# Patient Record
Sex: Female | Born: 1945 | Race: White | Hispanic: No | Smoking: Former smoker
Health system: Southern US, Community
[De-identification: ages and names within clinical notes are randomized; demographics above are authoritative.]

## PROBLEM LIST (undated history)

## (undated) DIAGNOSIS — D649 Anemia, unspecified: Secondary | ICD-10-CM

## (undated) DIAGNOSIS — J189 Pneumonia, unspecified organism: Secondary | ICD-10-CM

## (undated) DIAGNOSIS — R4182 Altered mental status, unspecified: Secondary | ICD-10-CM

## (undated) DIAGNOSIS — M069 Rheumatoid arthritis, unspecified: Secondary | ICD-10-CM

## (undated) DIAGNOSIS — M109 Gout, unspecified: Secondary | ICD-10-CM

## (undated) DIAGNOSIS — N259 Disorder resulting from impaired renal tubular function, unspecified: Secondary | ICD-10-CM

## (undated) DIAGNOSIS — L989 Disorder of the skin and subcutaneous tissue, unspecified: Secondary | ICD-10-CM

## (undated) DIAGNOSIS — R142 Eructation: Secondary | ICD-10-CM

## (undated) DIAGNOSIS — E875 Hyperkalemia: Secondary | ICD-10-CM

## (undated) DIAGNOSIS — I509 Heart failure, unspecified: Secondary | ICD-10-CM

## (undated) DIAGNOSIS — E785 Hyperlipidemia, unspecified: Principal | ICD-10-CM

## (undated) DIAGNOSIS — S88119A Complete traumatic amputation at level between knee and ankle, unspecified lower leg, initial encounter: Secondary | ICD-10-CM

## (undated) DIAGNOSIS — I10 Essential (primary) hypertension: Secondary | ICD-10-CM

## (undated) DIAGNOSIS — L98499 Non-pressure chronic ulcer of skin of other sites with unspecified severity: Secondary | ICD-10-CM

## (undated) DIAGNOSIS — K589 Irritable bowel syndrome without diarrhea: Secondary | ICD-10-CM

## (undated) DIAGNOSIS — K759 Inflammatory liver disease, unspecified: Secondary | ICD-10-CM

## (undated) DIAGNOSIS — M79609 Pain in unspecified limb: Secondary | ICD-10-CM

## (undated) DIAGNOSIS — R0902 Hypoxemia: Secondary | ICD-10-CM

## (undated) DIAGNOSIS — R569 Unspecified convulsions: Principal | ICD-10-CM

## (undated) DIAGNOSIS — M810 Age-related osteoporosis without current pathological fracture: Secondary | ICD-10-CM

## (undated) DIAGNOSIS — R143 Flatulence: Secondary | ICD-10-CM

## (undated) DIAGNOSIS — G894 Chronic pain syndrome: Secondary | ICD-10-CM

## (undated) DIAGNOSIS — R300 Dysuria: Secondary | ICD-10-CM

## (undated) DIAGNOSIS — M129 Arthropathy, unspecified: Secondary | ICD-10-CM

## (undated) DIAGNOSIS — R062 Wheezing: Secondary | ICD-10-CM

## (undated) DIAGNOSIS — E119 Type 2 diabetes mellitus without complications: Secondary | ICD-10-CM

## (undated) DIAGNOSIS — Z86010 Personal history of colonic polyps: Secondary | ICD-10-CM

## (undated) DIAGNOSIS — J690 Pneumonitis due to inhalation of food and vomit: Secondary | ICD-10-CM

## (undated) DIAGNOSIS — H9190 Unspecified hearing loss, unspecified ear: Secondary | ICD-10-CM

## (undated) DIAGNOSIS — K219 Gastro-esophageal reflux disease without esophagitis: Secondary | ICD-10-CM

## (undated) DIAGNOSIS — E871 Hypo-osmolality and hyponatremia: Secondary | ICD-10-CM

## (undated) DIAGNOSIS — E538 Deficiency of other specified B group vitamins: Secondary | ICD-10-CM

## (undated) DIAGNOSIS — G500 Trigeminal neuralgia: Secondary | ICD-10-CM

## (undated) DIAGNOSIS — L02419 Cutaneous abscess of limb, unspecified: Secondary | ICD-10-CM

## (undated) DIAGNOSIS — R609 Edema, unspecified: Secondary | ICD-10-CM

## (undated) DIAGNOSIS — Z8669 Personal history of other diseases of the nervous system and sense organs: Secondary | ICD-10-CM

## (undated) DIAGNOSIS — T782XXA Anaphylactic shock, unspecified, initial encounter: Secondary | ICD-10-CM

## (undated) DIAGNOSIS — M48061 Spinal stenosis, lumbar region without neurogenic claudication: Principal | ICD-10-CM

## (undated) DIAGNOSIS — R141 Gas pain: Secondary | ICD-10-CM

## (undated) DIAGNOSIS — G4710 Hypersomnia, unspecified: Secondary | ICD-10-CM

## (undated) DIAGNOSIS — F411 Generalized anxiety disorder: Secondary | ICD-10-CM

## (undated) DIAGNOSIS — L03119 Cellulitis of unspecified part of limb: Secondary | ICD-10-CM

## (undated) DIAGNOSIS — N959 Unspecified menopausal and perimenopausal disorder: Secondary | ICD-10-CM

## (undated) HISTORY — DX: Unspecified menopausal and perimenopausal disorder: N95.9

## (undated) HISTORY — DX: Chronic pain syndrome: G89.4

## (undated) HISTORY — DX: Flatulence: R14.3

## (undated) HISTORY — PX: TONSILLECTOMY: SUR1361

## (undated) HISTORY — DX: Anaphylactic shock, unspecified, initial encounter: T78.2XXA

## (undated) HISTORY — DX: Pneumonia, unspecified organism: J18.9

## (undated) HISTORY — DX: Disorder resulting from impaired renal tubular function, unspecified: N25.9

## (undated) HISTORY — DX: Hyperlipidemia, unspecified: E78.5

## (undated) HISTORY — DX: Spinal stenosis, lumbar region without neurogenic claudication: M48.061

## (undated) HISTORY — DX: Dysuria: R30.0

## (undated) HISTORY — DX: Hypo-osmolality and hyponatremia: E87.1

## (undated) HISTORY — DX: Cutaneous abscess of limb, unspecified: L02.419

## (undated) HISTORY — DX: Unspecified convulsions: R56.9

## (undated) HISTORY — DX: Inflammatory liver disease, unspecified: K75.9

## (undated) HISTORY — DX: Altered mental status, unspecified: R41.82

## (undated) HISTORY — DX: Gout, unspecified: M10.9

## (undated) HISTORY — DX: Non-pressure chronic ulcer of skin of other sites with unspecified severity: L98.499

## (undated) HISTORY — DX: Anemia, unspecified: D64.9

## (undated) HISTORY — DX: Cellulitis of unspecified part of limb: L03.119

## (undated) HISTORY — DX: Edema, unspecified: R60.9

## (undated) HISTORY — DX: Deficiency of other specified B group vitamins: E53.8

## (undated) HISTORY — PX: OTHER SURGICAL HISTORY: SHX169

## (undated) HISTORY — DX: Essential (primary) hypertension: I10

## (undated) HISTORY — DX: Pain in unspecified limb: M79.609

## (undated) HISTORY — DX: Pneumonitis due to inhalation of food and vomit: J69.0

## (undated) HISTORY — DX: Heart failure, unspecified: I50.9

## (undated) HISTORY — DX: Rheumatoid arthritis, unspecified: M06.9

## (undated) HISTORY — DX: Trigeminal neuralgia: G50.0

## (undated) HISTORY — DX: Hypoxemia: R09.02

## (undated) HISTORY — DX: Unspecified hearing loss, unspecified ear: H91.90

## (undated) HISTORY — DX: Personal history of colonic polyps: Z86.010

## (undated) HISTORY — DX: Disorder of the skin and subcutaneous tissue, unspecified: L98.9

## (undated) HISTORY — DX: Generalized anxiety disorder: F41.1

## (undated) HISTORY — DX: Wheezing: R06.2

## (undated) HISTORY — PX: TUBAL LIGATION: SHX77

## (undated) HISTORY — DX: Irritable bowel syndrome without diarrhea: K58.9

## (undated) HISTORY — DX: Gastro-esophageal reflux disease without esophagitis: K21.9

## (undated) HISTORY — DX: Arthropathy, unspecified: M12.9

## (undated) HISTORY — DX: Gas pain: R14.1

## (undated) HISTORY — PX: NASAL SEPTUM SURGERY: SHX37

## (undated) HISTORY — DX: Personal history of other diseases of the nervous system and sense organs: Z86.69

## (undated) HISTORY — DX: Hypersomnia, unspecified: G47.10

## (undated) HISTORY — DX: Age-related osteoporosis without current pathological fracture: M81.0

## (undated) HISTORY — DX: Type 2 diabetes mellitus without complications: E11.9

## (undated) HISTORY — DX: Hyperkalemia: E87.5

## (undated) HISTORY — DX: Complete traumatic amputation at level between knee and ankle, unspecified lower leg, initial encounter: S88.119A

## (undated) HISTORY — DX: Eructation: R14.2

---

## 1990-05-18 HISTORY — PX: OTHER SURGICAL HISTORY: SHX169

## 1998-12-27 ENCOUNTER — Other Ambulatory Visit: Admission: RE | Admit: 1998-12-27 | Discharge: 1998-12-27 | Payer: Self-pay | Admitting: Family Medicine

## 1999-05-02 ENCOUNTER — Encounter: Admission: RE | Admit: 1999-05-02 | Discharge: 1999-05-02 | Payer: Self-pay | Admitting: *Deleted

## 1999-05-02 ENCOUNTER — Encounter: Payer: Self-pay | Admitting: *Deleted

## 1999-06-13 ENCOUNTER — Ambulatory Visit (HOSPITAL_COMMUNITY): Admission: RE | Admit: 1999-06-13 | Discharge: 1999-06-13 | Payer: Self-pay | Admitting: *Deleted

## 1999-06-13 ENCOUNTER — Encounter: Payer: Self-pay | Admitting: *Deleted

## 1999-10-30 ENCOUNTER — Encounter: Admission: RE | Admit: 1999-10-30 | Discharge: 1999-10-30 | Payer: Self-pay | Admitting: Family Medicine

## 1999-10-30 ENCOUNTER — Encounter: Payer: Self-pay | Admitting: Family Medicine

## 2000-09-09 ENCOUNTER — Encounter: Admission: RE | Admit: 2000-09-09 | Discharge: 2000-09-09 | Payer: Self-pay | Admitting: *Deleted

## 2000-12-30 ENCOUNTER — Ambulatory Visit (HOSPITAL_COMMUNITY): Admission: RE | Admit: 2000-12-30 | Discharge: 2000-12-30 | Payer: Self-pay | Admitting: *Deleted

## 2001-01-18 ENCOUNTER — Encounter: Admission: RE | Admit: 2001-01-18 | Discharge: 2001-01-18 | Payer: Self-pay | Admitting: *Deleted

## 2001-12-23 ENCOUNTER — Ambulatory Visit (HOSPITAL_COMMUNITY): Admission: RE | Admit: 2001-12-23 | Discharge: 2001-12-23 | Payer: Self-pay | Admitting: *Deleted

## 2002-04-07 ENCOUNTER — Ambulatory Visit (HOSPITAL_COMMUNITY): Admission: RE | Admit: 2002-04-07 | Discharge: 2002-04-07 | Payer: Self-pay | Admitting: Orthopedic Surgery

## 2002-04-07 ENCOUNTER — Encounter: Payer: Self-pay | Admitting: Orthopedic Surgery

## 2002-11-08 ENCOUNTER — Encounter: Admission: RE | Admit: 2002-11-08 | Discharge: 2002-11-08 | Payer: Self-pay | Admitting: Geriatric Medicine

## 2003-03-07 ENCOUNTER — Encounter: Admission: RE | Admit: 2003-03-07 | Discharge: 2003-03-07 | Payer: Self-pay | Admitting: Family Medicine

## 2003-04-06 ENCOUNTER — Encounter: Admission: RE | Admit: 2003-04-06 | Discharge: 2003-04-06 | Payer: Self-pay | Admitting: Sports Medicine

## 2003-04-11 ENCOUNTER — Emergency Department (HOSPITAL_COMMUNITY): Admission: EM | Admit: 2003-04-11 | Discharge: 2003-04-11 | Payer: Self-pay | Admitting: Emergency Medicine

## 2003-05-03 ENCOUNTER — Encounter: Admission: RE | Admit: 2003-05-03 | Discharge: 2003-05-03 | Payer: Self-pay | Admitting: Family Medicine

## 2003-06-15 ENCOUNTER — Encounter: Admission: RE | Admit: 2003-06-15 | Discharge: 2003-06-15 | Payer: Self-pay | Admitting: Sports Medicine

## 2005-05-18 HISTORY — PX: OTHER SURGICAL HISTORY: SHX169

## 2006-01-14 ENCOUNTER — Emergency Department (HOSPITAL_COMMUNITY): Admission: EM | Admit: 2006-01-14 | Discharge: 2006-01-14 | Payer: Self-pay | Admitting: Emergency Medicine

## 2006-01-15 ENCOUNTER — Emergency Department (HOSPITAL_COMMUNITY): Admission: EM | Admit: 2006-01-15 | Discharge: 2006-01-16 | Payer: Self-pay | Admitting: Emergency Medicine

## 2006-01-17 ENCOUNTER — Emergency Department (HOSPITAL_COMMUNITY): Admission: EM | Admit: 2006-01-17 | Discharge: 2006-01-18 | Payer: Self-pay | Admitting: Emergency Medicine

## 2006-02-24 ENCOUNTER — Encounter: Admission: RE | Admit: 2006-02-24 | Discharge: 2006-05-25 | Payer: Self-pay | Admitting: Family Medicine

## 2006-03-19 ENCOUNTER — Ambulatory Visit (HOSPITAL_COMMUNITY): Admission: RE | Admit: 2006-03-19 | Discharge: 2006-03-19 | Payer: Self-pay | Admitting: Gastroenterology

## 2006-03-19 ENCOUNTER — Encounter (INDEPENDENT_AMBULATORY_CARE_PROVIDER_SITE_OTHER): Payer: Self-pay | Admitting: *Deleted

## 2006-03-29 ENCOUNTER — Emergency Department (HOSPITAL_COMMUNITY): Admission: EM | Admit: 2006-03-29 | Discharge: 2006-03-29 | Payer: Self-pay | Admitting: Emergency Medicine

## 2006-04-04 ENCOUNTER — Emergency Department (HOSPITAL_COMMUNITY): Admission: EM | Admit: 2006-04-04 | Discharge: 2006-04-04 | Payer: Self-pay | Admitting: Emergency Medicine

## 2006-04-22 ENCOUNTER — Ambulatory Visit: Payer: Self-pay | Admitting: Cardiovascular Disease

## 2006-05-26 ENCOUNTER — Encounter: Admission: RE | Admit: 2006-05-26 | Discharge: 2006-06-28 | Payer: Self-pay | Admitting: Family Medicine

## 2006-05-28 ENCOUNTER — Ambulatory Visit: Payer: Self-pay | Admitting: Cardiovascular Disease

## 2006-05-28 ENCOUNTER — Encounter: Payer: Self-pay | Admitting: Internal Medicine

## 2006-05-28 ENCOUNTER — Ambulatory Visit: Payer: Self-pay

## 2007-01-26 ENCOUNTER — Observation Stay (HOSPITAL_COMMUNITY): Admission: EM | Admit: 2007-01-26 | Discharge: 2007-01-27 | Payer: Self-pay | Admitting: Emergency Medicine

## 2007-01-27 ENCOUNTER — Encounter (INDEPENDENT_AMBULATORY_CARE_PROVIDER_SITE_OTHER): Payer: Self-pay | Admitting: *Deleted

## 2007-11-15 ENCOUNTER — Ambulatory Visit (HOSPITAL_COMMUNITY): Admission: RE | Admit: 2007-11-15 | Discharge: 2007-11-15 | Payer: Self-pay | Admitting: Specialist

## 2007-11-15 ENCOUNTER — Ambulatory Visit: Payer: Self-pay | Admitting: Vascular Surgery

## 2008-01-17 HISTORY — PX: OTHER SURGICAL HISTORY: SHX169

## 2008-01-30 ENCOUNTER — Ambulatory Visit: Payer: Self-pay | Admitting: Cardiology

## 2008-01-30 ENCOUNTER — Inpatient Hospital Stay (HOSPITAL_COMMUNITY): Admission: EM | Admit: 2008-01-30 | Discharge: 2008-02-15 | Payer: Self-pay | Admitting: Emergency Medicine

## 2008-01-31 ENCOUNTER — Encounter (INDEPENDENT_AMBULATORY_CARE_PROVIDER_SITE_OTHER): Payer: Self-pay | Admitting: Internal Medicine

## 2008-01-31 ENCOUNTER — Ambulatory Visit: Payer: Self-pay | Admitting: Vascular Surgery

## 2008-02-02 ENCOUNTER — Encounter (INDEPENDENT_AMBULATORY_CARE_PROVIDER_SITE_OTHER): Payer: Self-pay | Admitting: Orthopaedic Surgery

## 2008-02-02 HISTORY — PX: AMPUTATION: SHX166

## 2008-02-07 ENCOUNTER — Encounter (INDEPENDENT_AMBULATORY_CARE_PROVIDER_SITE_OTHER): Payer: Self-pay | Admitting: *Deleted

## 2008-02-08 ENCOUNTER — Encounter (INDEPENDENT_AMBULATORY_CARE_PROVIDER_SITE_OTHER): Payer: Self-pay | Admitting: Internal Medicine

## 2008-03-06 ENCOUNTER — Observation Stay (HOSPITAL_COMMUNITY): Admission: EM | Admit: 2008-03-06 | Discharge: 2008-03-08 | Payer: Self-pay | Admitting: Emergency Medicine

## 2008-04-03 ENCOUNTER — Encounter
Admission: RE | Admit: 2008-04-03 | Discharge: 2008-07-02 | Payer: Self-pay | Admitting: Physical Medicine & Rehabilitation

## 2008-04-04 ENCOUNTER — Ambulatory Visit: Payer: Self-pay | Admitting: Physical Medicine & Rehabilitation

## 2008-05-08 ENCOUNTER — Ambulatory Visit: Payer: Self-pay | Admitting: Physical Medicine & Rehabilitation

## 2008-05-16 ENCOUNTER — Encounter
Admission: RE | Admit: 2008-05-16 | Discharge: 2008-05-17 | Payer: Self-pay | Admitting: Physical Medicine & Rehabilitation

## 2008-05-21 ENCOUNTER — Encounter
Admission: RE | Admit: 2008-05-21 | Discharge: 2008-08-19 | Payer: Self-pay | Admitting: Physical Medicine & Rehabilitation

## 2008-06-15 ENCOUNTER — Ambulatory Visit: Payer: Self-pay | Admitting: Physical Medicine & Rehabilitation

## 2008-07-12 ENCOUNTER — Encounter
Admission: RE | Admit: 2008-07-12 | Discharge: 2008-10-10 | Payer: Self-pay | Admitting: Physical Medicine & Rehabilitation

## 2008-07-16 ENCOUNTER — Encounter: Payer: Self-pay | Admitting: Internal Medicine

## 2008-07-17 ENCOUNTER — Ambulatory Visit: Payer: Self-pay | Admitting: Internal Medicine

## 2008-07-17 DIAGNOSIS — Z860100 Personal history of colon polyps, unspecified: Secondary | ICD-10-CM

## 2008-07-17 DIAGNOSIS — D649 Anemia, unspecified: Secondary | ICD-10-CM

## 2008-07-17 DIAGNOSIS — F411 Generalized anxiety disorder: Secondary | ICD-10-CM | POA: Insufficient documentation

## 2008-07-17 DIAGNOSIS — E119 Type 2 diabetes mellitus without complications: Secondary | ICD-10-CM | POA: Insufficient documentation

## 2008-07-17 DIAGNOSIS — I10 Essential (primary) hypertension: Secondary | ICD-10-CM | POA: Insufficient documentation

## 2008-07-17 DIAGNOSIS — E538 Deficiency of other specified B group vitamins: Secondary | ICD-10-CM

## 2008-07-17 DIAGNOSIS — M069 Rheumatoid arthritis, unspecified: Secondary | ICD-10-CM | POA: Insufficient documentation

## 2008-07-17 DIAGNOSIS — K219 Gastro-esophageal reflux disease without esophagitis: Secondary | ICD-10-CM

## 2008-07-17 DIAGNOSIS — G609 Hereditary and idiopathic neuropathy, unspecified: Secondary | ICD-10-CM | POA: Insufficient documentation

## 2008-07-17 DIAGNOSIS — M79609 Pain in unspecified limb: Secondary | ICD-10-CM

## 2008-07-17 HISTORY — DX: Deficiency of other specified B group vitamins: E53.8

## 2008-07-17 HISTORY — DX: Personal history of colon polyps, unspecified: Z86.0100

## 2008-07-17 HISTORY — DX: Generalized anxiety disorder: F41.1

## 2008-07-17 HISTORY — DX: Rheumatoid arthritis, unspecified: M06.9

## 2008-07-17 HISTORY — DX: Personal history of colonic polyps: Z86.010

## 2008-07-17 HISTORY — DX: Pain in unspecified limb: M79.609

## 2008-07-17 HISTORY — DX: Anemia, unspecified: D64.9

## 2008-07-17 HISTORY — DX: Essential (primary) hypertension: I10

## 2008-07-17 HISTORY — DX: Type 2 diabetes mellitus without complications: E11.9

## 2008-07-17 HISTORY — DX: Gastro-esophageal reflux disease without esophagitis: K21.9

## 2008-07-17 LAB — CONVERTED CEMR LAB
ALT: 15 units/L (ref 0–35)
AST: 22 units/L (ref 0–37)
Alkaline Phosphatase: 81 units/L (ref 39–117)
BUN: 24 mg/dL — ABNORMAL HIGH (ref 6–23)
Basophils Relative: 0.5 % (ref 0.0–3.0)
Bilirubin Urine: NEGATIVE
CO2: 30 meq/L (ref 19–32)
Creatinine, Ser: 1.1 mg/dL (ref 0.4–1.2)
Eosinophils Absolute: 0.3 10*3/uL (ref 0.0–0.7)
Folate: >20 ng/mL
GFR calc Af Amer: 64 mL/min
HCT: 33.5 % — ABNORMAL LOW (ref 36.0–46.0)
HDL: 40.1 mg/dL (ref >39.0)
Ketones, ur: NEGATIVE mg/dL
Lymphocytes Relative: 42.1 % (ref 12.0–46.0)
MCHC: 34.6 g/dL (ref 30.0–36.0)
MCV: 87.9 fL (ref 78.0–100.0)
Neutro Abs: 2.3 10*3/uL (ref 1.4–7.7)
Nitrite: NEGATIVE
RDW: 12.5 % (ref 11.5–14.6)
Rhuematoid fact SerPl-aCnc: <20 intl units/mL — ABNORMAL LOW (ref 0.0–20.0)
Sed Rate: 56 mm/hr — ABNORMAL HIGH (ref 0–22)
Sodium: 139 meq/L (ref 135–145)
Specific Gravity, Urine: 1.025 (ref 1.000–1.035)
Total Bilirubin: 0.7 mg/dL (ref 0.3–1.2)
Total CHOL/HDL Ratio: 4
Total Protein: 7.7 g/dL (ref 6.0–8.3)
Urine Glucose: NEGATIVE mg/dL
Vitamin B-12: >1500 pg/mL — ABNORMAL HIGH (ref 211–911)
WBC: 5.3 10*3/uL (ref 4.5–10.5)

## 2008-07-18 ENCOUNTER — Ambulatory Visit: Payer: Self-pay | Admitting: Physical Medicine & Rehabilitation

## 2008-07-30 ENCOUNTER — Encounter: Payer: Self-pay | Admitting: Internal Medicine

## 2008-08-09 ENCOUNTER — Telehealth: Payer: Self-pay | Admitting: Internal Medicine

## 2008-08-15 ENCOUNTER — Ambulatory Visit: Payer: Self-pay | Admitting: Physical Medicine & Rehabilitation

## 2008-08-20 ENCOUNTER — Telehealth (INDEPENDENT_AMBULATORY_CARE_PROVIDER_SITE_OTHER): Payer: Self-pay | Admitting: *Deleted

## 2008-08-23 ENCOUNTER — Encounter
Admission: RE | Admit: 2008-08-23 | Discharge: 2008-11-20 | Payer: Self-pay | Admitting: Physical Medicine & Rehabilitation

## 2008-08-28 ENCOUNTER — Telehealth: Payer: Self-pay | Admitting: Internal Medicine

## 2008-08-30 ENCOUNTER — Ambulatory Visit: Payer: Self-pay | Admitting: Internal Medicine

## 2008-08-30 DIAGNOSIS — R609 Edema, unspecified: Secondary | ICD-10-CM

## 2008-08-30 HISTORY — DX: Edema, unspecified: R60.9

## 2008-08-31 ENCOUNTER — Telehealth: Payer: Self-pay | Admitting: Internal Medicine

## 2008-09-12 ENCOUNTER — Ambulatory Visit: Payer: Self-pay | Admitting: Physical Medicine & Rehabilitation

## 2008-09-28 ENCOUNTER — Ambulatory Visit (HOSPITAL_COMMUNITY)
Admission: RE | Admit: 2008-09-28 | Discharge: 2008-09-28 | Payer: Self-pay | Admitting: Physical Medicine & Rehabilitation

## 2008-10-10 ENCOUNTER — Telehealth (INDEPENDENT_AMBULATORY_CARE_PROVIDER_SITE_OTHER): Payer: Self-pay | Admitting: *Deleted

## 2008-10-11 ENCOUNTER — Ambulatory Visit: Payer: Self-pay | Admitting: Internal Medicine

## 2008-10-11 DIAGNOSIS — R143 Flatulence: Secondary | ICD-10-CM

## 2008-10-11 DIAGNOSIS — R635 Abnormal weight gain: Secondary | ICD-10-CM | POA: Insufficient documentation

## 2008-10-11 DIAGNOSIS — R142 Eructation: Secondary | ICD-10-CM

## 2008-10-11 DIAGNOSIS — R141 Gas pain: Secondary | ICD-10-CM

## 2008-10-11 HISTORY — DX: Gas pain: R14.1

## 2008-10-14 ENCOUNTER — Encounter: Payer: Self-pay | Admitting: Internal Medicine

## 2008-10-16 ENCOUNTER — Encounter
Admission: RE | Admit: 2008-10-16 | Discharge: 2009-01-14 | Payer: Self-pay | Admitting: Physical Medicine & Rehabilitation

## 2008-10-16 LAB — CONVERTED CEMR LAB
CO2: 31 meq/L (ref 19–32)
GFR calc non Af Amer: 37.07 mL/min (ref >60)
Glucose, Bld: 90 mg/dL (ref 70–99)
Potassium: 5.7 meq/L — ABNORMAL HIGH (ref 3.5–5.1)
Sodium: 139 meq/L (ref 135–145)

## 2008-10-19 ENCOUNTER — Ambulatory Visit: Payer: Self-pay | Admitting: Physical Medicine & Rehabilitation

## 2008-11-02 ENCOUNTER — Encounter: Payer: Self-pay | Admitting: Internal Medicine

## 2008-11-02 ENCOUNTER — Ambulatory Visit: Payer: Self-pay

## 2008-11-13 ENCOUNTER — Telehealth (INDEPENDENT_AMBULATORY_CARE_PROVIDER_SITE_OTHER): Payer: Self-pay | Admitting: *Deleted

## 2008-11-14 ENCOUNTER — Telehealth: Payer: Self-pay | Admitting: Internal Medicine

## 2008-11-14 ENCOUNTER — Ambulatory Visit: Payer: Self-pay | Admitting: Physical Medicine & Rehabilitation

## 2008-11-15 ENCOUNTER — Ambulatory Visit: Payer: Self-pay | Admitting: Internal Medicine

## 2008-11-15 DIAGNOSIS — L02419 Cutaneous abscess of limb, unspecified: Secondary | ICD-10-CM

## 2008-11-15 DIAGNOSIS — L03119 Cellulitis of unspecified part of limb: Secondary | ICD-10-CM

## 2008-11-15 HISTORY — DX: Cellulitis of unspecified part of limb: L02.419

## 2008-11-27 ENCOUNTER — Ambulatory Visit: Payer: Self-pay | Admitting: Internal Medicine

## 2008-11-27 LAB — CONVERTED CEMR LAB
BUN: 40 mg/dL — ABNORMAL HIGH (ref 6–23)
Basophils Absolute: 0.1 10*3/uL (ref 0.0–0.1)
Chloride: 100 meq/L (ref 96–112)
Cholesterol: 184 mg/dL (ref 0–200)
Direct LDL: 126.4 mg/dL
Eosinophils Absolute: 0.2 10*3/uL (ref 0.0–0.7)
HDL: 33.1 mg/dL — ABNORMAL LOW (ref >39.00)
Hemoglobin: 11 g/dL — ABNORMAL LOW (ref 12.0–15.0)
Lymphocytes Relative: 22.6 % (ref 12.0–46.0)
Monocytes Relative: 8.6 % (ref 3.0–12.0)
Neutro Abs: 4.2 10*3/uL (ref 1.4–7.7)
Neutrophils Relative %: 64.6 % (ref 43.0–77.0)
Platelets: 280 10*3/uL (ref 150.0–400.0)
Potassium: 4.9 meq/L (ref 3.5–5.1)
RDW: 11.5 % (ref 11.5–14.6)
Sodium: 139 meq/L (ref 135–145)
Total CHOL/HDL Ratio: 6
VLDL: 42.8 mg/dL — ABNORMAL HIGH (ref 0.0–40.0)

## 2008-11-28 ENCOUNTER — Telehealth: Payer: Self-pay | Admitting: Internal Medicine

## 2008-12-06 ENCOUNTER — Telehealth (INDEPENDENT_AMBULATORY_CARE_PROVIDER_SITE_OTHER): Payer: Self-pay | Admitting: *Deleted

## 2008-12-10 ENCOUNTER — Telehealth (INDEPENDENT_AMBULATORY_CARE_PROVIDER_SITE_OTHER): Payer: Self-pay | Admitting: *Deleted

## 2008-12-11 ENCOUNTER — Ambulatory Visit: Payer: Self-pay | Admitting: Physical Medicine & Rehabilitation

## 2008-12-11 ENCOUNTER — Ambulatory Visit: Payer: Self-pay | Admitting: Internal Medicine

## 2008-12-11 DIAGNOSIS — M109 Gout, unspecified: Secondary | ICD-10-CM

## 2008-12-11 HISTORY — DX: Gout, unspecified: M10.9

## 2008-12-14 ENCOUNTER — Ambulatory Visit: Payer: Self-pay | Admitting: *Deleted

## 2008-12-14 ENCOUNTER — Ambulatory Visit (HOSPITAL_COMMUNITY)
Admission: RE | Admit: 2008-12-14 | Discharge: 2008-12-14 | Payer: Self-pay | Admitting: Physical Medicine & Rehabilitation

## 2008-12-14 ENCOUNTER — Encounter: Payer: Self-pay | Admitting: Physical Medicine & Rehabilitation

## 2008-12-17 ENCOUNTER — Telehealth (INDEPENDENT_AMBULATORY_CARE_PROVIDER_SITE_OTHER): Payer: Self-pay | Admitting: *Deleted

## 2009-01-09 ENCOUNTER — Ambulatory Visit: Payer: Self-pay | Admitting: Physical Medicine & Rehabilitation

## 2009-01-23 ENCOUNTER — Ambulatory Visit: Payer: Self-pay | Admitting: Internal Medicine

## 2009-01-23 DIAGNOSIS — J069 Acute upper respiratory infection, unspecified: Secondary | ICD-10-CM | POA: Insufficient documentation

## 2009-01-23 DIAGNOSIS — J019 Acute sinusitis, unspecified: Secondary | ICD-10-CM | POA: Insufficient documentation

## 2009-01-28 DIAGNOSIS — I509 Heart failure, unspecified: Secondary | ICD-10-CM

## 2009-01-28 DIAGNOSIS — S88119A Complete traumatic amputation at level between knee and ankle, unspecified lower leg, initial encounter: Secondary | ICD-10-CM

## 2009-01-28 DIAGNOSIS — K759 Inflammatory liver disease, unspecified: Secondary | ICD-10-CM

## 2009-01-28 DIAGNOSIS — M129 Arthropathy, unspecified: Secondary | ICD-10-CM

## 2009-01-28 DIAGNOSIS — Z9089 Acquired absence of other organs: Secondary | ICD-10-CM

## 2009-01-28 DIAGNOSIS — Z8669 Personal history of other diseases of the nervous system and sense organs: Secondary | ICD-10-CM

## 2009-01-28 DIAGNOSIS — Z89519 Acquired absence of unspecified leg below knee: Secondary | ICD-10-CM | POA: Insufficient documentation

## 2009-01-28 HISTORY — DX: Heart failure, unspecified: I50.9

## 2009-01-28 HISTORY — DX: Arthropathy, unspecified: M12.9

## 2009-01-28 HISTORY — DX: Complete traumatic amputation at level between knee and ankle, unspecified lower leg, initial encounter: S88.119A

## 2009-01-28 HISTORY — DX: Inflammatory liver disease, unspecified: K75.9

## 2009-01-28 HISTORY — DX: Personal history of other diseases of the nervous system and sense organs: Z86.69

## 2009-02-05 ENCOUNTER — Telehealth: Payer: Self-pay | Admitting: Internal Medicine

## 2009-02-06 ENCOUNTER — Encounter
Admission: RE | Admit: 2009-02-06 | Discharge: 2009-05-07 | Payer: Self-pay | Admitting: Physical Medicine & Rehabilitation

## 2009-02-07 ENCOUNTER — Ambulatory Visit: Payer: Self-pay | Admitting: Physical Medicine & Rehabilitation

## 2009-02-08 ENCOUNTER — Ambulatory Visit: Payer: Self-pay | Admitting: Internal Medicine

## 2009-02-08 DIAGNOSIS — L98499 Non-pressure chronic ulcer of skin of other sites with unspecified severity: Secondary | ICD-10-CM

## 2009-02-08 HISTORY — DX: Non-pressure chronic ulcer of skin of other sites with unspecified severity: L98.499

## 2009-02-21 ENCOUNTER — Encounter: Payer: Self-pay | Admitting: Gastroenterology

## 2009-02-26 ENCOUNTER — Ambulatory Visit: Payer: Self-pay | Admitting: Physical Medicine & Rehabilitation

## 2009-03-05 ENCOUNTER — Encounter
Admission: RE | Admit: 2009-03-05 | Discharge: 2009-05-16 | Payer: Self-pay | Admitting: Physical Medicine & Rehabilitation

## 2009-03-12 ENCOUNTER — Ambulatory Visit: Payer: Self-pay | Admitting: Internal Medicine

## 2009-03-22 ENCOUNTER — Telehealth: Payer: Self-pay | Admitting: Internal Medicine

## 2009-03-26 ENCOUNTER — Ambulatory Visit: Payer: Self-pay | Admitting: Physical Medicine & Rehabilitation

## 2009-03-27 ENCOUNTER — Ambulatory Visit: Payer: Self-pay | Admitting: Internal Medicine

## 2009-03-27 LAB — CONVERTED CEMR LAB
Chloride: 99 meq/L (ref 96–112)
GFR calc non Af Amer: 37.02 mL/min (ref >60)
Potassium: 4.3 meq/L (ref 3.5–5.1)
Sodium: 139 meq/L (ref 135–145)

## 2009-04-23 ENCOUNTER — Ambulatory Visit: Payer: Self-pay | Admitting: Physical Medicine & Rehabilitation

## 2009-04-24 ENCOUNTER — Telehealth (INDEPENDENT_AMBULATORY_CARE_PROVIDER_SITE_OTHER): Payer: Self-pay | Admitting: *Deleted

## 2009-04-29 ENCOUNTER — Telehealth: Payer: Self-pay | Admitting: Internal Medicine

## 2009-05-15 ENCOUNTER — Ambulatory Visit: Payer: Self-pay | Admitting: Gastroenterology

## 2009-05-15 DIAGNOSIS — T782XXA Anaphylactic shock, unspecified, initial encounter: Secondary | ICD-10-CM

## 2009-05-15 HISTORY — DX: Anaphylactic shock, unspecified, initial encounter: T78.2XXA

## 2009-05-20 ENCOUNTER — Encounter
Admission: RE | Admit: 2009-05-20 | Discharge: 2009-08-18 | Payer: Self-pay | Admitting: Physical Medicine & Rehabilitation

## 2009-05-21 ENCOUNTER — Ambulatory Visit: Payer: Self-pay | Admitting: Physical Medicine & Rehabilitation

## 2009-05-22 ENCOUNTER — Telehealth: Payer: Self-pay | Admitting: Gastroenterology

## 2009-05-24 ENCOUNTER — Ambulatory Visit (HOSPITAL_COMMUNITY): Admission: RE | Admit: 2009-05-24 | Discharge: 2009-05-24 | Payer: Self-pay | Admitting: Gastroenterology

## 2009-05-24 ENCOUNTER — Encounter: Payer: Self-pay | Admitting: Internal Medicine

## 2009-05-24 ENCOUNTER — Ambulatory Visit: Payer: Self-pay | Admitting: Gastroenterology

## 2009-05-24 LAB — HM COLONOSCOPY

## 2009-05-27 ENCOUNTER — Telehealth: Payer: Self-pay | Admitting: Internal Medicine

## 2009-06-04 ENCOUNTER — Ambulatory Visit: Payer: Self-pay | Admitting: Internal Medicine

## 2009-06-04 DIAGNOSIS — K589 Irritable bowel syndrome without diarrhea: Secondary | ICD-10-CM

## 2009-06-04 DIAGNOSIS — H6690 Otitis media, unspecified, unspecified ear: Secondary | ICD-10-CM | POA: Insufficient documentation

## 2009-06-04 DIAGNOSIS — H9190 Unspecified hearing loss, unspecified ear: Secondary | ICD-10-CM

## 2009-06-04 DIAGNOSIS — L989 Disorder of the skin and subcutaneous tissue, unspecified: Secondary | ICD-10-CM | POA: Insufficient documentation

## 2009-06-04 HISTORY — DX: Disorder of the skin and subcutaneous tissue, unspecified: L98.9

## 2009-06-04 HISTORY — DX: Irritable bowel syndrome, unspecified: K58.9

## 2009-06-04 HISTORY — DX: Unspecified hearing loss, unspecified ear: H91.90

## 2009-06-05 LAB — CONVERTED CEMR LAB
CO2: 28 meq/L (ref 19–32)
Chloride: 103 meq/L (ref 96–112)
Direct LDL: 121.8 mg/dL
Glucose, Bld: 147 mg/dL — ABNORMAL HIGH (ref 70–99)
HDL: 28 mg/dL — ABNORMAL LOW (ref >39.00)
Hgb A1c MFr Bld: 6.2 % (ref 4.6–6.5)
Potassium: 4.5 meq/L (ref 3.5–5.1)
Sodium: 141 meq/L (ref 135–145)
Triglycerides: 524 mg/dL — ABNORMAL HIGH (ref 0.0–149.0)
VLDL: 104.8 mg/dL — ABNORMAL HIGH (ref 0.0–40.0)

## 2009-06-10 ENCOUNTER — Encounter: Payer: Self-pay | Admitting: Internal Medicine

## 2009-06-11 ENCOUNTER — Telehealth: Payer: Self-pay | Admitting: Internal Medicine

## 2009-06-11 DIAGNOSIS — R197 Diarrhea, unspecified: Secondary | ICD-10-CM

## 2009-06-17 ENCOUNTER — Encounter
Admission: RE | Admit: 2009-06-17 | Discharge: 2009-09-05 | Payer: Self-pay | Admitting: Physical Medicine & Rehabilitation

## 2009-06-18 ENCOUNTER — Ambulatory Visit: Payer: Self-pay | Admitting: Physical Medicine & Rehabilitation

## 2009-06-18 ENCOUNTER — Encounter: Payer: Self-pay | Admitting: Internal Medicine

## 2009-07-09 ENCOUNTER — Ambulatory Visit: Payer: Self-pay | Admitting: Gastroenterology

## 2009-07-12 ENCOUNTER — Encounter: Payer: Self-pay | Admitting: Gastroenterology

## 2009-07-15 ENCOUNTER — Ambulatory Visit: Payer: Self-pay | Admitting: Physical Medicine & Rehabilitation

## 2009-07-24 ENCOUNTER — Telehealth: Payer: Self-pay | Admitting: Gastroenterology

## 2009-07-26 ENCOUNTER — Ambulatory Visit: Payer: Self-pay | Admitting: Physical Medicine & Rehabilitation

## 2009-08-02 ENCOUNTER — Ambulatory Visit: Payer: Self-pay | Admitting: Physical Medicine & Rehabilitation

## 2009-08-05 ENCOUNTER — Ambulatory Visit: Payer: Self-pay | Admitting: Gastroenterology

## 2009-08-06 ENCOUNTER — Telehealth: Payer: Self-pay | Admitting: Internal Medicine

## 2009-09-05 ENCOUNTER — Encounter
Admission: RE | Admit: 2009-09-05 | Discharge: 2009-11-26 | Payer: Self-pay | Source: Home / Self Care | Admitting: Physical Medicine & Rehabilitation

## 2009-09-12 ENCOUNTER — Ambulatory Visit: Payer: Self-pay | Admitting: Physical Medicine & Rehabilitation

## 2009-09-30 ENCOUNTER — Telehealth: Payer: Self-pay | Admitting: Internal Medicine

## 2009-10-02 ENCOUNTER — Telehealth: Payer: Self-pay | Admitting: Gastroenterology

## 2009-10-10 ENCOUNTER — Telehealth: Payer: Self-pay | Admitting: Gastroenterology

## 2009-10-22 ENCOUNTER — Ambulatory Visit: Payer: Self-pay | Admitting: Physical Medicine & Rehabilitation

## 2009-10-22 ENCOUNTER — Encounter: Payer: Self-pay | Admitting: Internal Medicine

## 2009-10-25 ENCOUNTER — Telehealth: Payer: Self-pay | Admitting: Internal Medicine

## 2009-10-31 ENCOUNTER — Ambulatory Visit: Payer: Self-pay | Admitting: Internal Medicine

## 2009-10-31 DIAGNOSIS — N259 Disorder resulting from impaired renal tubular function, unspecified: Secondary | ICD-10-CM

## 2009-10-31 DIAGNOSIS — E875 Hyperkalemia: Secondary | ICD-10-CM

## 2009-10-31 DIAGNOSIS — N189 Chronic kidney disease, unspecified: Secondary | ICD-10-CM | POA: Insufficient documentation

## 2009-10-31 HISTORY — DX: Hyperkalemia: E87.5

## 2009-10-31 HISTORY — DX: Disorder resulting from impaired renal tubular function, unspecified: N25.9

## 2009-11-01 LAB — CONVERTED CEMR LAB
BUN: 31 mg/dL — ABNORMAL HIGH (ref 6–23)
Basophils Relative: 0.1 % (ref 0.0–3.0)
Calcium: 9.2 mg/dL (ref 8.4–10.5)
Creatinine, Ser: 1.6 mg/dL — ABNORMAL HIGH (ref 0.4–1.2)
Eosinophils Absolute: 1.8 10*3/uL — ABNORMAL HIGH (ref 0.0–0.7)
Iron: 66 ug/dL (ref 42–145)
Lymphocytes Relative: 21.2 % (ref 12.0–46.0)
MCHC: 33.9 g/dL (ref 30.0–36.0)
MCV: 93.2 fL (ref 78.0–100.0)
Monocytes Absolute: 0.5 10*3/uL (ref 0.1–1.0)
Neutrophils Relative %: 56.8 % (ref 43.0–77.0)
Platelets: 301 10*3/uL (ref 150.0–400.0)
RBC: 3.4 M/uL — ABNORMAL LOW (ref 3.87–5.11)
Saturation Ratios: 22.7 % (ref 20.0–50.0)
Transferrin: 207.5 mg/dL — ABNORMAL LOW (ref 212.0–360.0)
WBC: 10.8 10*3/uL — ABNORMAL HIGH (ref 4.5–10.5)

## 2009-11-05 ENCOUNTER — Encounter: Admission: RE | Admit: 2009-11-05 | Discharge: 2009-11-05 | Payer: Self-pay | Admitting: Internal Medicine

## 2009-11-07 ENCOUNTER — Ambulatory Visit: Payer: Self-pay | Admitting: Internal Medicine

## 2009-11-07 ENCOUNTER — Encounter: Admission: RE | Admit: 2009-11-07 | Discharge: 2009-11-07 | Payer: Self-pay | Admitting: Internal Medicine

## 2009-11-07 LAB — CONVERTED CEMR LAB
BUN: 36 mg/dL — ABNORMAL HIGH (ref 6–23)
CO2: 27 meq/L (ref 19–32)
Calcium: 9.2 mg/dL (ref 8.4–10.5)
Creatinine, Ser: 1.7 mg/dL — ABNORMAL HIGH (ref 0.4–1.2)
GFR calc non Af Amer: 32.87 mL/min (ref >60)
Glucose, Bld: 131 mg/dL — ABNORMAL HIGH (ref 70–99)
Sodium: 138 meq/L (ref 135–145)

## 2009-11-11 ENCOUNTER — Ambulatory Visit: Payer: Self-pay | Admitting: Internal Medicine

## 2009-11-11 ENCOUNTER — Telehealth: Payer: Self-pay | Admitting: Internal Medicine

## 2009-11-11 DIAGNOSIS — R300 Dysuria: Secondary | ICD-10-CM

## 2009-11-11 HISTORY — DX: Dysuria: R30.0

## 2009-11-11 LAB — CONVERTED CEMR LAB
Bilirubin Urine: NEGATIVE
Ketones, ur: NEGATIVE mg/dL
Urine Glucose: NEGATIVE mg/dL
Urobilinogen, UA: 0.2 (ref 0.0–1.0)

## 2009-11-25 ENCOUNTER — Telehealth: Payer: Self-pay | Admitting: Internal Medicine

## 2009-11-26 ENCOUNTER — Encounter
Admission: RE | Admit: 2009-11-26 | Discharge: 2009-12-04 | Payer: Self-pay | Admitting: Physical Medicine & Rehabilitation

## 2009-12-03 ENCOUNTER — Ambulatory Visit: Payer: Self-pay | Admitting: Internal Medicine

## 2009-12-03 DIAGNOSIS — N959 Unspecified menopausal and perimenopausal disorder: Secondary | ICD-10-CM

## 2009-12-03 DIAGNOSIS — R062 Wheezing: Secondary | ICD-10-CM | POA: Insufficient documentation

## 2009-12-03 DIAGNOSIS — R5383 Other fatigue: Secondary | ICD-10-CM

## 2009-12-03 DIAGNOSIS — R5381 Other malaise: Secondary | ICD-10-CM

## 2009-12-03 HISTORY — DX: Wheezing: R06.2

## 2009-12-03 HISTORY — DX: Unspecified menopausal and perimenopausal disorder: N95.9

## 2009-12-04 ENCOUNTER — Ambulatory Visit: Payer: Self-pay | Admitting: Physical Medicine & Rehabilitation

## 2010-01-01 ENCOUNTER — Telehealth: Payer: Self-pay | Admitting: Internal Medicine

## 2010-02-07 ENCOUNTER — Ambulatory Visit: Payer: Self-pay | Admitting: Internal Medicine

## 2010-02-07 ENCOUNTER — Telehealth: Payer: Self-pay | Admitting: Internal Medicine

## 2010-02-07 ENCOUNTER — Inpatient Hospital Stay (HOSPITAL_COMMUNITY): Admission: EM | Admit: 2010-02-07 | Discharge: 2010-02-13 | Payer: Self-pay | Admitting: Emergency Medicine

## 2010-02-07 DIAGNOSIS — R4182 Altered mental status, unspecified: Secondary | ICD-10-CM

## 2010-02-07 DIAGNOSIS — R509 Fever, unspecified: Secondary | ICD-10-CM

## 2010-02-07 HISTORY — DX: Altered mental status, unspecified: R41.82

## 2010-02-10 LAB — CONVERTED CEMR LAB
AST: 9 units/L (ref 0–37)
Albumin: 4 g/dL (ref 3.5–5.2)
Basophils Absolute: 0 10*3/uL (ref 0.0–0.1)
CO2: 24 meq/L (ref 19–32)
Chloride: 97 meq/L (ref 96–112)
GFR calc non Af Amer: 9.81 mL/min (ref >60)
Glucose, Bld: 93 mg/dL (ref 70–99)
HCT: 28 % — ABNORMAL LOW (ref 36.0–46.0)
Hemoglobin: 9.5 g/dL — ABNORMAL LOW (ref 12.0–15.0)
Lymphs Abs: 1.9 10*3/uL (ref 0.7–4.0)
MCHC: 34 g/dL (ref 30.0–36.0)
Monocytes Relative: 6.9 % (ref 3.0–12.0)
Neutro Abs: 5.8 10*3/uL (ref 1.4–7.7)
Potassium: 5.4 meq/L — ABNORMAL HIGH (ref 3.5–5.1)
RDW: 12.9 % (ref 11.5–14.6)
Sodium: 134 meq/L — ABNORMAL LOW (ref 135–145)
TSH: 0.77 microintl units/mL (ref 0.35–5.50)

## 2010-02-14 ENCOUNTER — Ambulatory Visit: Payer: Self-pay | Admitting: Internal Medicine

## 2010-02-14 DIAGNOSIS — J189 Pneumonia, unspecified organism: Secondary | ICD-10-CM

## 2010-02-14 DIAGNOSIS — G894 Chronic pain syndrome: Secondary | ICD-10-CM | POA: Insufficient documentation

## 2010-02-14 DIAGNOSIS — G4710 Hypersomnia, unspecified: Secondary | ICD-10-CM | POA: Insufficient documentation

## 2010-02-14 DIAGNOSIS — G500 Trigeminal neuralgia: Secondary | ICD-10-CM

## 2010-02-14 HISTORY — DX: Trigeminal neuralgia: G50.0

## 2010-02-14 HISTORY — DX: Pneumonia, unspecified organism: J18.9

## 2010-02-14 HISTORY — DX: Chronic pain syndrome: G89.4

## 2010-02-14 HISTORY — DX: Hypersomnia, unspecified: G47.10

## 2010-02-17 ENCOUNTER — Telehealth: Payer: Self-pay | Admitting: Internal Medicine

## 2010-02-18 ENCOUNTER — Telehealth: Payer: Self-pay | Admitting: Internal Medicine

## 2010-02-18 ENCOUNTER — Encounter
Admission: RE | Admit: 2010-02-18 | Discharge: 2010-05-19 | Payer: Self-pay | Source: Home / Self Care | Attending: Physical Medicine & Rehabilitation | Admitting: Physical Medicine & Rehabilitation

## 2010-02-20 ENCOUNTER — Telehealth: Payer: Self-pay | Admitting: Internal Medicine

## 2010-02-25 ENCOUNTER — Ambulatory Visit: Payer: Self-pay | Admitting: Internal Medicine

## 2010-02-25 ENCOUNTER — Ambulatory Visit: Payer: Self-pay | Admitting: Physical Medicine & Rehabilitation

## 2010-02-27 ENCOUNTER — Encounter: Payer: Self-pay | Admitting: Internal Medicine

## 2010-03-04 ENCOUNTER — Encounter: Payer: Self-pay | Admitting: Internal Medicine

## 2010-03-10 ENCOUNTER — Encounter: Payer: Self-pay | Admitting: Internal Medicine

## 2010-03-11 ENCOUNTER — Ambulatory Visit: Payer: Self-pay | Admitting: Physical Medicine & Rehabilitation

## 2010-03-17 ENCOUNTER — Encounter: Payer: Self-pay | Admitting: Internal Medicine

## 2010-03-20 ENCOUNTER — Ambulatory Visit: Payer: Self-pay | Admitting: Internal Medicine

## 2010-03-20 DIAGNOSIS — E871 Hypo-osmolality and hyponatremia: Secondary | ICD-10-CM

## 2010-03-20 DIAGNOSIS — R0902 Hypoxemia: Secondary | ICD-10-CM

## 2010-03-20 HISTORY — DX: Hypoxemia: R09.02

## 2010-03-20 HISTORY — DX: Hypo-osmolality and hyponatremia: E87.1

## 2010-03-21 ENCOUNTER — Ambulatory Visit: Payer: Self-pay | Admitting: Physical Medicine & Rehabilitation

## 2010-03-21 LAB — CONVERTED CEMR LAB
Calcium: 9.1 mg/dL (ref 8.4–10.5)
Chloride: 101 meq/L (ref 96–112)
Creatinine, Ser: 1.3 mg/dL — ABNORMAL HIGH (ref 0.4–1.2)
Eosinophils Absolute: 0.4 10*3/uL (ref 0.0–0.7)
GFR calc non Af Amer: 42.77 mL/min (ref >60)
Hemoglobin, Urine: NEGATIVE
Iron: 61 ug/dL (ref 42–145)
MCHC: 34 g/dL (ref 30.0–36.0)
MCV: 92 fL (ref 78.0–100.0)
Monocytes Absolute: 0.6 10*3/uL (ref 0.1–1.0)
Neutrophils Relative %: 56.3 % (ref 43.0–77.0)
Nitrite: NEGATIVE
Platelets: 219 10*3/uL (ref 150.0–400.0)
Total Protein, Urine: NEGATIVE mg/dL
Transferrin: 244.1 mg/dL (ref 212.0–360.0)
Urobilinogen, UA: 0.2 (ref 0.0–1.0)

## 2010-03-24 ENCOUNTER — Telehealth: Payer: Self-pay | Admitting: Internal Medicine

## 2010-03-27 ENCOUNTER — Telehealth: Payer: Self-pay | Admitting: Internal Medicine

## 2010-03-28 ENCOUNTER — Encounter: Payer: Self-pay | Admitting: Internal Medicine

## 2010-04-01 ENCOUNTER — Encounter: Payer: Self-pay | Admitting: Internal Medicine

## 2010-04-04 ENCOUNTER — Telehealth: Payer: Self-pay | Admitting: Internal Medicine

## 2010-04-07 ENCOUNTER — Telehealth: Payer: Self-pay | Admitting: Internal Medicine

## 2010-04-09 ENCOUNTER — Telehealth: Payer: Self-pay | Admitting: Internal Medicine

## 2010-04-17 ENCOUNTER — Ambulatory Visit: Payer: Self-pay | Admitting: Physical Medicine & Rehabilitation

## 2010-04-22 ENCOUNTER — Encounter: Payer: Self-pay | Admitting: Internal Medicine

## 2010-04-22 ENCOUNTER — Telehealth: Payer: Self-pay | Admitting: Internal Medicine

## 2010-04-24 ENCOUNTER — Encounter: Payer: Self-pay | Admitting: Internal Medicine

## 2010-05-15 ENCOUNTER — Ambulatory Visit: Payer: Self-pay | Admitting: Physical Medicine & Rehabilitation

## 2010-05-29 ENCOUNTER — Encounter: Payer: Self-pay | Admitting: Internal Medicine

## 2010-06-08 ENCOUNTER — Encounter: Payer: Self-pay | Admitting: Otolaryngology

## 2010-06-08 ENCOUNTER — Encounter: Payer: Self-pay | Admitting: Geriatric Medicine

## 2010-06-09 ENCOUNTER — Encounter: Payer: Self-pay | Admitting: Internal Medicine

## 2010-06-09 ENCOUNTER — Encounter
Admission: RE | Admit: 2010-06-09 | Discharge: 2010-06-17 | Payer: Self-pay | Source: Home / Self Care | Attending: Physical Medicine & Rehabilitation | Admitting: Physical Medicine & Rehabilitation

## 2010-06-15 LAB — CONVERTED CEMR LAB
ALT: 15 units/L (ref 0–35)
AST: 17 units/L (ref 0–37)
Albumin: 3.8 g/dL (ref 3.5–5.2)
BUN: 49 mg/dL — ABNORMAL HIGH (ref 6–23)
Basophils Absolute: 0 10*3/uL (ref 0.0–0.1)
Bilirubin Urine: NEGATIVE
CO2: 30 meq/L (ref 19–32)
Chloride: 107 meq/L (ref 96–112)
Cholesterol: 220 mg/dL — ABNORMAL HIGH (ref 0–200)
Creatinine,U: 79.1 mg/dL
Direct LDL: 139.2 mg/dL
GFR calc non Af Amer: 35.31 mL/min (ref >60)
Glucose, Bld: 95 mg/dL (ref 70–99)
HCT: 29.3 % — ABNORMAL LOW (ref 36.0–46.0)
Hemoglobin, Urine: NEGATIVE
Hemoglobin: 9.8 g/dL — ABNORMAL LOW (ref 12.0–15.0)
Hgb A1c MFr Bld: 6.4 % (ref 4.6–6.5)
Ketones, ur: NEGATIVE mg/dL
Lymphs Abs: 1.5 10*3/uL (ref 0.7–4.0)
MCV: 93.8 fL (ref 78.0–100.0)
Microalb, Ur: 2.1 mg/dL — ABNORMAL HIGH (ref 0.0–1.9)
Monocytes Absolute: 0.5 10*3/uL (ref 0.1–1.0)
Monocytes Relative: 7.1 % (ref 3.0–12.0)
Neutro Abs: 3.9 10*3/uL (ref 1.4–7.7)
Potassium: 4.9 meq/L (ref 3.5–5.1)
RDW: 12.7 % (ref 11.5–14.6)
Sodium: 140 meq/L (ref 135–145)
TSH: 1.05 microintl units/mL (ref 0.35–5.50)
Total CHOL/HDL Ratio: 6
Urobilinogen, UA: 0.2 (ref 0.0–1.0)

## 2010-06-16 ENCOUNTER — Ambulatory Visit
Admission: RE | Admit: 2010-06-16 | Discharge: 2010-06-16 | Payer: Self-pay | Source: Home / Self Care | Attending: Physical Medicine & Rehabilitation | Admitting: Physical Medicine & Rehabilitation

## 2010-06-17 ENCOUNTER — Ambulatory Visit: Admit: 2010-06-17 | Payer: Self-pay | Admitting: Internal Medicine

## 2010-06-18 ENCOUNTER — Telehealth: Payer: Self-pay | Admitting: Internal Medicine

## 2010-06-19 NOTE — Miscellaneous (Signed)
Summary: Missed Visit/[facility]  Missed Visit/[facility]   Imported By: Sherian Rein 03/11/2010 11:54:11  _____________________________________________________________________  External Attachment:    Type:   Image     Comment:   External Document

## 2010-06-19 NOTE — Procedures (Signed)
Summary: Colonoscopy/[facility]  Colonoscopy/[facility]   Imported By: Sherian Rein 06/05/2009 14:45:09  _____________________________________________________________________  External Attachment:    Type:   Image     Comment:   External Document

## 2010-06-19 NOTE — Progress Notes (Signed)
Summary: Triage  Phone Note Call from Patient Call back at Home Phone [phone]   Caller: Patient Call For: Dr. Arlyce Dice Reason for Call: Talk to Nurse Summary of Call: pt is having severe diarrhea and would like to discuss Initial call taken by: Karna Christmas,  Oct 10, 2009 9:19 AM  Follow-up for Phone Call        Episodes of diarrhea becomming more frequent, now almost daily.  Has 2-3 watery stools daily. May begin as a normal BM, then goes to loose and then turns watery. Recently she began having more pain with these episodes. Denies blood,mucus, n/v, fever. Uses Lomotil two times a day as needed, 1-2 Immodium per episode, probiotic daily.   1) Begin Metamucil daily 2) Increase probiotic to two times a day 3) BRAT diet 4) Call with an update next week 5) I will call pt., if new orders, after MD reviews.   Follow-up by: Laureen Ochs LPN,  Oct 10, 2009 10:01 AM  Additional Follow-up for Phone Call Additional follow up Details #1::        If  no better on this regimen she should have a stool for lactoferrin and C. difficile toxin Additional Follow-up by: Louis Meckel MD,  Oct 10, 2009 11:46 AM

## 2010-06-19 NOTE — Letter (Signed)
Summary: CMN/[facility]  CMN/[facility]   Imported By: Lester Foxworth 06/15/2009 10:28:59  _____________________________________________________________________  External Attachment:    Type:   Image     Comment:   External Document

## 2010-06-19 NOTE — Procedures (Signed)
Summary: Instructions for procedure/[facility] (out pt)  Instructions for procedure/[facility] (out pt)   Imported By: Sherian Rein 05/22/2009 11:25:21  _____________________________________________________________________  External Attachment:    Type:   Image     Comment:   External Document

## 2010-06-19 NOTE — Assessment & Plan Note (Signed)
Summary: kidney function test -per kelly per dr Sammuel Cooper sched earlier wk w...   Vital Signs:  Patient profile:   65 year old female Height:      70 inches Weight:      274 pounds BMI:     39.46 O2 Sat:      94 % on Room air Temp:     98.1 degrees F oral Pulse rate:   86 / minute BP sitting:   110 / 62  (left arm) Cuff size:   large  Vitals Entered ByZella Ball Ewing (October 31, 2009 3:53 PM)  O2 Flow:  Room air CC: Discuss recent labs/RE   Primary Care Provider:  [name], MD   CC:  Discuss recent labs/RE.  History of Present Illness: here to f/u recent labs with renal insufficiency;  pt denies fever, n/v, abd pain, GU symtpoms such as dysuria, freq, urgency, or hematuria or back pain; but has had mild decreased by mouth intake;  Pt denies CP, sob, doe, wheezing, orthopnea, pnd, worsening LE edema, palps, dizziness or syncope   Pt denies new neuro symptoms such as headache, facial or extremity weakness  Mild elev K noted as well,  No overt bleeding or bruising.    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  () 16)  Tubal Ligation, Hx of  (ICD-V26.51) 17)  Deviated Septum,surgery  (ICD-470) 18)  Foot Surgery Back in 1980 and 1981  () 19)  Neuropathy, Hx of  (ICD-V12.40) 20)  Unspecified Hepatitis  (ICD-573.3) 21)  Arthritis  (ICD-716.90) 22)  CHF  (ICD-428.0) 23)  Sinusitis- Acute-nos  (ICD-461.9) 24)  Uri   (ICD-465.9) 25)  Acute Gouty Arthropathy  (ICD-274.01) 26)  Cellulitis, Left Leg  (ICD-682.6) 27)  Cellulitis, Leg, Left  (ICD-682.6) 28)  Abdominal Distension  (ICD-787.3) 29)  Weight Gain  (ICD-783.1) 30)  Peripheral Edema  (ICD-782.3) 31)  Foot Pain  (ICD-729.5) 32)  B12 Deficiency  (ICD-266.2) 33)  Preventive Health Care  (ICD-V70.0) 34)  Colonic Polyps, Hx of  (ICD-V12.72) 35)  Anxiety  (ICD-300.00) 36)  Gerd  (ICD-530.81) 37)  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 1 Patch Q 3 Days 2)  Benicar 40 Mg Tabs (Olmesartan Medoxomil) .... 1/2  Tab Once Daily 3)  Cymbalta 60 Mg Cpep (Duloxetine Hcl) .... 2 Cap Qd 4)  Pantoprazole Sodium 40 Mg Tbec (Pantoprazole Sodium) .[name].. 1 By Mouth Once Daily 5)  Furosemide 80 Mg Tabs (Furosemide) .[name].. 1 By Mouth Two Times A Day 6)  Gabapentin 300 Mg Caps (Gabapentin) .... 2 By Mouth in The Am, 1 By Mouth in The Afternoon, Then 2 By Mouth in  The Pm 7)  Oxycodone-Acetaminophen 10-325 Mg Tabs (Oxycodone-Acetaminophen) .[name].. 1 By Mouth Q 6 Hours As Needed 8)  Tylenol Extra Strength 500 Mg Tabs (Acetaminophen) .[name].. 1 Tab By Mouth Every 4 Hours As Needed 9)  Alprazolam 0.5 Mg Tabs (Alprazolam) .[name].. 1 Tab By Mouth Three Times A Day As Needed 10)  Anti-Diarrheal 2 Mg Caps (Loperamide Hcl) .... Take 2 Ytabs By Mouth Prn 11)  Cyclobenzaprine Hcl 5 Mg Tabs (Cyclobenzaprine Hcl) .[name].. 1 Tab By Mouth Every 8 Hours As Needed 12)  Epipen 2-Pak 0.3 Mg/0.36ml (1:1000) Devi (Epinephrine Hcl (Anaphylaxis)) .... Use Asd 13)  Nystatin 100000 Unit/gm Powd (Nystatin) .... Use Asd To Affected Area Two Times A Day As Needed 14)  Topamax 100 Mg Tabs (Topiramate) .... 3 By Mouth Once Daily 15)  Flexeril 5 Mg Tabs (Cyclobenzaprine Hcl) .[name].. 1po Three Times A Day As  Needed 16)  Promethazine Hcl 25 Mg Tabs (Promethazine Hcl) .[name].. 1 By Mouth Q 6 Hrs As Needed Nausea 17)  Cortisporin-Tc 3.07-18-08-0.5 Mg/ml Susp (Neomycin-Colist-Hc-Thonzonium) .... 2 Gtts Qid For 10 Days To Right Ear 18)  Vitamin C 500 Mg Tabs (Ascorbic Acid) .[name].. 1 By Mouth Once Daily 19)  Centrum Silver Ultra Womens  Tabs (Multiple Vitamins-Minerals) .[name].. 1 By Mouth Once Daily 20)  Glucosamine 500 Mg Caps (Glucosamine Sulfate) .[name].. 1 By Mouth Once Daily 21)  Colchicine 0.6 Mg Tabs (Colchicine) .[name].. 1 By Mouth Two Times A Day 22)  Lidoderm 5 % Ptch (Lidocaine) .... As Needed 23)  Vitamin D 400 Unit Caps (Cholecalciferol) .[name].. 1 Cap Daily 24)  Aldactone 50 Mg Tabs (Spironolactone) .[name].. 1 By Mouth Once Daily 25)  Prosthetic Sleeve Socket .... Use Asd 26)  Lomotil 2.5-0.025 Mg Tabs (Diphenoxylate-Atropine) .[name].. 1 By Mouth Two Times A Day As Needed 27)  Fish Oil 1000 Mg Caps (Omega-3 Fatty Acids) .[name].. 1 Capsule By Mouth Once Daily 28)  Metamucil 30.9 % Powd (Psyllium) .... Take As Directed  Current Medications (verified): 1)  Fentanyl 75 Mcg/hr Pt72 (Fentanyl) .[name].. 1 Patch Q 3 Days 2)  Benicar 40 Mg Tabs (Olmesartan Medoxomil) .... 1/2  Tab Once Daily 3)  Cymbalta 60 Mg Cpep (Duloxetine Hcl) .... 2 Cap Qd 4)  Pantoprazole Sodium 40 Mg Tbec (Pantoprazole Sodium) .[name].. 1 By Mouth Once Daily 5)  Furosemide 80 Mg Tabs (Furosemide) .[name].. 1 By Mouth Two Times A Day 6)  Gabapentin 300 Mg Caps (Gabapentin) .... 2 By Mouth in The Am, 1 By Mouth in The Afternoon, Then 2 By Mouth in The Pm 7)  Oxycodone-Acetaminophen 10-325 Mg Tabs (Oxycodone-Acetaminophen) .[name].. 1 By Mouth Q 6 Hours As Needed 8)  Tylenol Extra Strength 500 Mg Tabs (Acetaminophen) .[name].. 1 Tab By Mouth Every 4 Hours As Needed 9)  Alprazolam 0.5 Mg Tabs (Alprazolam) .[name].. 1 Tab By Mouth Three Times A Day As Needed 10)  Anti-Diarrheal 2 Mg Caps (Loperamide Hcl) .... Take 2 Ytabs By Mouth Prn 11)  Cyclobenzaprine Hcl 5 Mg Tabs (Cyclobenzaprine  Hcl) .[name].. 1 Tab By Mouth Every 8 Hours As Needed 12)  Epipen 2-Pak 0.3 Mg/0.58ml (1:1000) Devi (Epinephrine Hcl (Anaphylaxis)) .... Use Asd 13)  Nystatin 100000 Unit/gm Powd (Nystatin) .... Use Asd To Affected Area Two Times A Day As Needed 14)  Topamax 100 Mg Tabs (Topiramate) .... 3 By Mouth Once Daily 15)  Flexeril 5 Mg Tabs (Cyclobenzaprine Hcl) .[name].. 1po Three Times A Day As Needed 16)  Promethazine Hcl 25 Mg Tabs (Promethazine Hcl) .[name].. 1 By Mouth Q  6 Hrs As Needed Nausea 17)  Cortisporin-Tc 3.07-18-08-0.5 Mg/ml Susp (Neomycin-Colist-Hc-Thonzonium) .... 2 Gtts Qid For 10 Days To Right Ear 18)  Vitamin C 500 Mg Tabs (Ascorbic Acid) .[name].. 1 By Mouth Once Daily 19)  Centrum Silver Ultra Womens  Tabs (Multiple Vitamins-Minerals) .[name].. 1 By Mouth Once Daily 20)  Glucosamine 500 Mg Caps (Glucosamine Sulfate) .[name].. 1 By Mouth Once Daily 21)  Colchicine 0.6 Mg Tabs (Colchicine) .[name].. 1 By Mouth Two Times A Day 22)  Lidoderm 5 % Ptch (Lidocaine) .... As Needed 23)  Vitamin D 400 Unit Caps (Cholecalciferol) .[name].. 1 Cap Daily 24)  Aldactone 50 Mg Tabs (Spironolactone) .[name].. 1 By Mouth Once Daily 25)  Prosthetic Sleeve Socket .... Use Asd 26)  Lomotil 2.5-0.025 Mg Tabs (Diphenoxylate-Atropine) .[name].. 1 By Mouth Two Times A Day As Needed 27)  Fish Oil 1000 Mg Caps (Omega-3 Fatty Acids) .[name].. 1 Capsule By Mouth Once Daily 28)  Metamucil 30.9 % Powd (Psyllium) .... Take As Directed  Allergies (verified): 1)  ! * All Anti-Inflammatories 2)  ! * All Steroids 3)  ! Sulfa 4)  ! Erythromycin 5)  ! Cipro 6)  ! Morphine  Past History:  Past Surgical History: Last updated: 03/19/2009 Current Problems:  AMPUTATION, BELOW KNEE, RIGHT, HX OF (ICD-V49.75) (02/02/2008) * S/P EGD 2007 WITH BOTOX FOR ? 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  Alcohol Use - no  Risk Factors: Smoking Status: quit (01/28/2009)  Past Medical History: Current Problems:  NEUROPATHY, HX OF (ICD-V12.40) UNSPECIFIED HEPATITIS (ICD-573.3) ARTHRITIS (ICD-716.90) CHF (ICD-428.0) SINUSITIS- ACUTE-NOS (ICD-461.9) URI (ICD-465.9) ACUTE GOUTY ARTHROPATHY (ICD-274.01) CELLULITIS, LEFT LEG (ICD-682.6) CELLULITIS, LEG, LEFT (ICD-682.6) ABDOMINAL DISTENSION (ICD-787.3) WEIGHT GAIN (ICD-783.1) PERIPHERAL EDEMA (ICD-782.3) FOOT PAIN (ICD-729.5) B12 DEFICIENCY (ICD-266.2) PREVENTIVE HEALTH CARE (ICD-V70.0) COLONIC POLYPS, HX OF (ICD-V12.72) ANXIETY (ICD-300.00) GERD  RHEUMATOID ARTHRITIS (ICD-714.0) ANEMIA-NOS (ICD-285.9) PERIPHERAL NEUROPATHY (ICD-356.9) HYPERTENSION (ICD-401.9) DIABETES MELLITUS, TYPE II (ICD-250.00) * 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 left ankle charcot joint CHRONIC DYSPHAGIA IBS Renal insufficiency  Review of Systems       all otherwise negative per pt -    Physical Exam  General:  alert and overweight-appearing.   Head:  normocephalic and atraumatic.   Eyes:  vision grossly intact, pupils equal, and pupils round.   Ears:  R ear normal and L ear normal.   Nose:  no external deformity and no nasal discharge.   Mouth:  no gingival abnormalities and pharynx pink and moist.   Neck:  supple and no masses.   Lungs:  normal respiratory effort and normal breath sounds.   Heart:  normal rate and regular rhythm.   Abdomen:  soft, non-tender, and normal bowel sounds.   Msk:  no flank tender Extremities:  no edema, no erythema    Impression & Recommendations:  Problem # 1:  RENAL INSUFFICIENCY (ICD-588.9) for u/s, f/u labs; by hx most likely due to low volume; to decrease diuretic, urine studies;  ok to cont the benicar as is for  now Orders: Radiology Referral (Radiology) TLB-BMP (Basic Metabolic Panel-BMET) (80048-METABOL)  Problem # 2:  ANEMIA-NOS (ICD-285.9) asympt - for lab f/u today Orders: TLB-CBC Platelet - w/Differential (85025-CBCD) TLB-IBC Pnl (  Iron/FE;Transferrin) (83550-IBC) TLB-B12 + Folate Pnl (82746_82607-B12/FOL)  Problem # 3:  HYPERKALEMIA (ICD-276.7) mild ,recent , ? due to renal insuff, and/or aldactone; f/u lab as above, also to stop the aldactone  Problem # 4:  HYPERTENSION (ICD-401.9)  Her updated medication list for this problem includes:    Benicar 40 Mg Tabs (Olmesartan medoxomil) .[name]... 1/2  tab once daily    Furosemide 80 Mg Tabs (Furosemide) .[name]... 1 by mouth two times a day    Aldactone 50 Mg Tabs (Spironolactone) .[name]... 1 by mouth once daily  BP today: 110/62 Prior BP: 94/52 (08/05/2009)  Labs Reviewed: K+: 4.5 (06/04/2009) Creat: : 1.7 (06/04/2009)   Chol: 197 (06/04/2009)   HDL: 28.00 (06/04/2009)   LDL: 102 (07/17/2008)   TG: 524.0 (06/04/2009) stable overall by hx and exam, ok to continue meds/tx as is   Complete Medication List: 1)  Fentanyl 75 Mcg/hr Pt72 (Fentanyl) .[name].. 1 patch q 3 days 2)  Benicar 40 Mg Tabs (Olmesartan medoxomil) .... 1/2  tab once daily 3)  Cymbalta 60 Mg Cpep (Duloxetine hcl) .... 2 cap qd 4)  Pantoprazole Sodium 40 Mg Tbec (Pantoprazole sodium) .[name].. 1 by mouth once daily 5)  Furosemide 80 Mg Tabs (Furosemide) .[name].. 1 by mouth two times a day 6)  Gabapentin 300 Mg Caps (Gabapentin) .... 2 by mouth in the am, 1 by mouth in the afternoon, then 2 by mouth in the pm 7)  Oxycodone-acetaminophen 10-325 Mg Tabs (Oxycodone-acetaminophen) .[name].. 1 by mouth q 6 hours as needed 8)  Tylenol Extra Strength 500 Mg Tabs (Acetaminophen) .[name].. 1 tab by mouth every 4 hours as needed 9)  Alprazolam 0.5 Mg Tabs (Alprazolam) .[name].. 1 tab by mouth three times a day as needed 10)  Anti-diarrheal 2 Mg Caps (Loperamide hcl) .... Take 2 ytabs by mouth prn 11)  Cyclobenzaprine  Hcl 5 Mg Tabs (Cyclobenzaprine hcl) .[name].. 1 tab by mouth every 8 hours as needed 12)  Epipen 2-pak 0.3 Mg/0.23ml (1:1000) Devi (Epinephrine hcl (anaphylaxis)) .... Use asd 13)  Nystatin 100000 Unit/gm Powd (Nystatin) .... Use asd to affected area two times a day as needed 14)  Topamax 100 Mg Tabs (Topiramate) .... 3 by mouth once daily 15)  Flexeril 5 Mg Tabs (Cyclobenzaprine hcl) .[name].. 1po three times a day as needed 16)  Promethazine Hcl 25 Mg Tabs (Promethazine hcl) .[name].. 1 by mouth q 6 hrs as needed nausea 17)  Cortisporin-tc 3.07-18-08-0.5 Mg/ml Susp (Neomycin-colist-hc-thonzonium) .... 2 gtts qid for 10 days to right ear 18)  Vitamin C 500 Mg Tabs (Ascorbic acid) .[name].. 1 by mouth once daily 19)  Centrum Silver Ultra Womens Tabs (Multiple vitamins-minerals) .[name].. 1 by mouth once daily 20)  Glucosamine 500 Mg Caps (Glucosamine sulfate) .[name].. 1 by mouth once daily 21)  Colchicine 0.6 Mg Tabs (Colchicine) .[name].. 1 by mouth two times a day 22)  Lidoderm 5 % Ptch (Lidocaine) .... As needed 23)  Vitamin D 400 Unit Caps (Cholecalciferol) .[name].. 1 cap daily 24)  Aldactone 50 Mg Tabs (Spironolactone) .[name].. 1 by mouth once daily 25)  Prosthetic Sleeve Socket  .... Use asd 26)  Lomotil 2.5-0.025 Mg Tabs (Diphenoxylate-atropine) .[name].. 1 by mouth two times a day as needed 27)  Fish Oil 1000 Mg Caps (Omega-3 fatty acids) .[name].. 1 capsule by mouth once daily 28)  Metamucil 30.9 % Powd (Psyllium) .... Take as directed  Patient Instructions: 1)  stop the spironolactone as you have 2)  decrease the lasix to 80 mg in the  AM, 40 mg in the PM as you have 3)  Please go to the Lab in the basement for your blood and/or urine tests today 4)  You will be contacted about the referral(s) to: kidney ultrasound 5)  Continue all previous medications as before this visit  6)  please return in 1 wk for followup labs:  BMET  588.9 7)  Please schedule a follow-up appointment in 1 month.

## 2010-06-19 NOTE — Progress Notes (Signed)
Summary: Elevated BP  Phone Note From Other Clinic   Caller: Shelly [phone] -Therapist Summary of Call: Physical Therapist assistant called. Pt's bp was 172/101, after resting it was 168/88. Pt started Benicar 1/2 tab on Monday. Should patient make any further changes? Please advise.   Call pts home # and speak with caregiver with any changes in meds.  Initial call taken by: Lamar Sprinkles, CMA,  February 20, 2010 12:25 PM  Follow-up for Phone Call        ok to take whole pill , call with BP next mon oct 10 Follow-up by: Corwin Levins MD,  February 20, 2010 2:34 PM  Additional Follow-up for Phone Call Additional follow up Details #1::        Pt's caregiver advised of above and will call back on Monday with BP reading Additional Follow-up by: Margaret Pyle, CMA,  February 20, 2010 2:45 PM    New/Updated Medications: BENICAR 40 MG TABS (OLMESARTAN MEDOXOMIL) 1  tab once daily

## 2010-06-19 NOTE — Progress Notes (Signed)
Summary: CRITICAL LAB  Phone Note From Other Clinic   Summary of Call: LAB called w/Critical: Creat 4.7 BUN 70 - Results printed and given to MD Initial call taken by: Lamar Sprinkles, CMA,  February 07, 2010 5:22 PM  Follow-up for Phone Call        pt with acute renal failure - to go to ER now Follow-up by: Corwin Levins MD,  February 07, 2010 5:34 PM  Additional Follow-up for Phone Call Additional follow up Details #1::        Pt's daughter informed. They were sitting in the ER parking lot and daughter was trying to convince patient to go in, she will go in to ER now.   Additional Follow-up by: Lamar Sprinkles, CMA,  February 07, 2010 5:43 PM

## 2010-06-19 NOTE — Medication Information (Signed)
Summary: Order ONO/[facility]  Order [facility]NO/[facility]   Imported By: Lester Deale 04/02/2010 08:48:21  _____________________________________________________________________  External Attachment:    Type:   Image     Comment:   External Document

## 2010-06-19 NOTE — Progress Notes (Signed)
Summary: Bladder inf?  Phone Note Call from Patient Call back at Work Phone [phone]   Caller: Patient Summary of Call: Pt called stating that she believes she has a bladder infection; severe pain 6/10 per pt, frequency and urgency. Pt is requesting an order to have a urine sample done at the lab with culture and ABX. Pt is at surgical center today having a malignant growth removed (pt did not say from where). please advise? Initial call taken by: Margaret Pyle, CMA,  November 11, 2009 8:27 AM  Follow-up for Phone Call        Pt has appt sch today @ 2:45p Follow-up by: Margaret Pyle, CMA,  November 11, 2009 10:54 AM

## 2010-06-19 NOTE — Progress Notes (Signed)
Summary: Referral  Phone Note Call from Patient Call back at Home Phone [phone]   Caller: Patient Summary of Call: pt called back stating that she would like ot have referral to GI. she also says that she saw Dr. Arlyce Dice last week for Colonoscopy and would like to be referred back to him if possible. Initial call taken by: Margaret Pyle, CMA,  June 11, 2009 3:49 PM  Follow-up for Phone Call        ok - refer GI Follow-up by: Corwin Levins MD,  June 11, 2009 5:19 PM  New Problems: DIARRHEA (ICD-787.91)   New Problems: DIARRHEA (ICD-787.91)

## 2010-06-19 NOTE — Assessment & Plan Note (Signed)
Summary: disoriented/confused/shakey/ok triage/Crystal Fitzpatrick/lb   Vital Signs:  Patient profile:   65 year old female Height:      70 inches (177.80 cm) Weight:      270 pounds (122.73 kg) O2 Sat:      92 % on Room air Temp:     100.5 degrees F (38.06 degrees C) oral Pulse rate:   98 / minute BP sitting:   86 / 52  (left arm) Cuff size:   large  Vitals Entered By: Orlan Leavens RMA (February 07, 2010 3:22 PM)  O2 Flow:  Room air CC: Disoriented/ confused/ shakiness Pain Assessment Patient in pain? yes     Location: (R) ear Type: aching Comments Pt states she ? right ear infected   Primary Care Provider:  [name], MD   CC:  Disoriented/ confused/ shakiness.  History of Present Illness: here with intermittent confusion onset 2 days ago caregiver (who is neighbor who checks on pt every other day, no fulltime supervison) reports pt with increase from baseline confusion a/w jerking movements of arms - no LOC or sz activity pt c/o right ear pain and "fullness" no HA, CP, cough no n/v/d  Clinical Review Panels:  CBC   WBC:  7.0 (12/03/2009)   RBC:  3.12 (12/03/2009)   Hgb:  9.8 (12/03/2009)   Hct:  29.3 (12/03/2009)   Platelets:  235.0 (12/03/2009)   MCV  93.8 (12/03/2009)   MCHC  33.5 (12/03/2009)   RDW  12.7 (12/03/2009)   PMN:  56.5 (12/03/2009)   Lymphs:  21.8 (12/03/2009)   Monos:  7.1 (12/03/2009)   Eosinophils:  14.2 (12/03/2009)   Basophil:  0.4 (12/03/2009)  Complete Metabolic Panel   Glucose:  95 (12/03/2009)   Sodium:  140 (12/03/2009)   Potassium:  4.9 (12/03/2009)   Chloride:  107 (12/03/2009)   CO2:  30 (12/03/2009)   BUN:  49 (12/03/2009)   Creatinine:  1.6 (12/03/2009)   Albumin:  3.8 (12/03/2009)   Total Protein:  7.4 (12/03/2009)   Calcium:  8.6 (12/03/2009)   Total Bili:  0.3 (12/03/2009)   Alk Phos:  92 (12/03/2009)   SGPT (ALT):  15 (12/03/2009)   SGOT (AST):  17 (12/03/2009)   Current Medications (verified): 1)  Fentanyl 75 Mcg/hr Pt72  (Fentanyl) .[name].. 1 Patch Q 3 Days 2)  Benicar 40 Mg Tabs (Olmesartan Medoxomil) .... 1/2  Tab Once Daily 3)  Cymbalta 60 Mg Cpep (Duloxetine Hcl) .... 2 Cap Qd 4)  Pantoprazole Sodium 40 Mg Tbec (Pantoprazole Sodium) .[name].. 1 By Mouth Once Daily 5)  Furosemide 80 Mg Tabs (Furosemide) .[name].. 1 By Mouth Two Times A Day 6)  Gabapentin 300 Mg Caps (Gabapentin) .... 2 By Mouth in The Am, 1 By Mouth in The Afternoon, Then 2 By Mouth in The Pm 7)  Oxycodone-Acetaminophen 10-325 Mg Tabs (Oxycodone-Acetaminophen) .[name].. 1 By Mouth Q 6 Hours As Needed 8)  Tylenol Extra Strength 500 Mg Tabs (Acetaminophen) .[name].. 1 Tab By Mouth Every 4 Hours As Needed 9)  Alprazolam 0.5 Mg Tabs (Alprazolam) .[name].. 1 Tab By Mouth Three Times A Day As Needed 10)  Anti-Diarrheal 2 Mg Caps (Loperamide Hcl) .... Take 2 Ytabs By Mouth Prn 11)  Cyclobenzaprine Hcl 5 Mg Tabs (Cyclobenzaprine Hcl) .[name].. 1 Tab By Mouth Every 8 Hours As Needed 12)  Epipen 2-Pak 0.3 Mg/0.18ml (1:1000) Devi (Epinephrine Hcl (Anaphylaxis)) .... Use Asd 13)  Nystatin 100000 Unit/gm Powd (Nystatin) .... Use Asd To Affected Area Two Times A Day As Needed  14)  Topamax 100 Mg Tabs (Topiramate) .... 3 By Mouth Once Daily 15)  Flexeril 5 Mg Tabs (Cyclobenzaprine Hcl) .[name].. 1po Three Times A Day As Needed 16)  Promethazine Hcl 25 Mg Tabs (Promethazine Hcl) .[name].. 1 By Mouth Q 6 Hrs As Needed Nausea 17)  Cortisporin-Tc 3.07-18-08-0.5 Mg/ml Susp (Neomycin-Colist-Hc-Thonzonium) .... 2 Gtts Qid For 10 Days To Right Ear 18)  Vitamin C 500 Mg Tabs (Ascorbic Acid) .[name].. 1 By Mouth Once Daily 19)  Centrum Silver Ultra Womens  Tabs (Multiple Vitamins-Minerals) .[name].. 1 By Mouth Once Daily 20)  Glucosamine 500 Mg Caps (Glucosamine Sulfate) .[name].. 1 By Mouth Once Daily 21)  Colchicine 0.6 Mg Tabs (Colchicine) .[name].. 1 By Mouth Two Times A Day 22)  Lidoderm 5 % Ptch (Lidocaine) .... As Needed 23)  Vitamin D 400 Unit Caps (Cholecalciferol) .[name].. 1 Cap Daily 24)  Aldactone 50 Mg Tabs  (Spironolactone) .[name].. 1 By Mouth Once Daily 25)  Prosthetic Sleeve Socket .... Use Asd 26)  Lomotil 2.5-0.025 Mg Tabs (Diphenoxylate-Atropine) .[name].. 1 By Mouth Two Times A Day As Needed 27)  Fish Oil 1000 Mg Caps (Omega-3 Fatty Acids) .[name].. 1 Capsule By Mouth Once Daily 28)  Metamucil 30.9 % Powd (Psyllium) .... Take As Directed 29)  Epipen 2-Pak 0.3 Mg/0.87ml Devi (Epinephrine) .... Use Asd As Needed 30)  Proair Hfa 108 (90 Base) Mcg/act Aers (Albuterol Sulfate) .... 2 Puffs Q6 Hrs As Needed  Allergies (verified): 1)  ! * All Anti-Inflammatories 2)  ! * All Steroids 3)  ! Sulfa 4)  ! Erythromycin 5)  ! Cipro 6)  ! Morphine 7)  ! 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    Physical Exam  General:  alert and overweight-appearing.  neighbor at side Eyes:  vision grossly intact, pupils equal, and pupils round.   Ears:  R ear normal and L ear normal.   Mouth:  teeth and gums in good repair; mucous membranes moist, without lesions or ulcers. oropharynx clear without exudate, no erythema.  Lungs:  normal respiratory effort, no intercostal retractions or use of accessory  muscles; normal breath sounds bilaterally - no crackles and no wheezes.    Heart:  normal rate, regular rhythm, no murmur, and no rub. BLE without  edema. (s/p R foot amputation) Psych:  Oriented X3, memory intact for recent and remote, normally interactive, good eye contact, min anxious appearing, not depressed appearing, and not agitated.      Impression & Recommendations:  Problem # 1:  FEVER UNSPECIFIED (ICD-780.60) ?infx precipitating mental status change -  pt c/o right ear pain but exam w/o obvious abn - LGF and mild hypotension but current mental status approp and nontoxic on exam check labs including Ucx start emperic abx - f/u next week w/ PCP to review (esp as caregiver has concerns about home saftey and recent change in caregiver supervision) if symptoms continue to worsen (pain, fever, etc), or if unable take anything by mouth (pills, fluids, etc), pt should go to the emergency room for further evaluation and treatment - pt and neighbor agree to same Orders: TLB-B12, Serum-Total ONLY (60454-U98) TLB-BMP (Basic Metabolic Panel-BMET) (80048-METABOL) TLB-CBC Platelet - w/Differential (85025-CBCD) TLB-Hepatic/Liver Function Pnl (80076-HEPATIC) TLB-Udip w/ Micro (81001-URINE) T-Culture, Urine ([id number]) TLB-TSH (Thyroid Stimulating Hormone) (84443-TSH) Prescription Created Electronically [phone])  Problem # 2:  ALTERED MENTAL STATUS (ICD-780.97) see above - currently at baseline; noted hx psychosis with med illness Orders: TLB-B12, Serum-Total ONLY (86578-I69) TLB-BMP (Basic Metabolic Panel-BMET) (80048-METABOL) TLB-CBC Platelet - w/Differential (85025-CBCD) TLB-Hepatic/Liver Function Pnl (80076-HEPATIC) TLB-Udip w/ Micro (81001-URINE) T-Culture, Urine ([id number]) TLB-TSH (Thyroid Stimulating Hormone) (84443-TSH)  Complete Medication List: 1)  Fentanyl 75 Mcg/hr Pt72 (Fentanyl) .[name].. 1 patch q 3 days 2)  Benicar 40 Mg Tabs (Olmesartan medoxomil) .... 1/2  tab once  daily 3)  Cymbalta 60 Mg Cpep (Duloxetine hcl) .... 2 cap qd 4)  Pantoprazole Sodium 40 Mg Tbec (Pantoprazole sodium) .[name].. 1 by mouth once daily 5)  Furosemide 80 Mg Tabs (Furosemide) .[name].. 1 by mouth two times a day 6)  Gabapentin 300 Mg Caps (Gabapentin) .... 2 by mouth in the am, 1 by mouth in the afternoon, then 2 by mouth in the pm 7)  Oxycodone-acetaminophen 10-325 Mg Tabs (Oxycodone-acetaminophen) .[name].. 1 by mouth q 6 hours as needed 8)  Tylenol Extra Strength 500 Mg Tabs (Acetaminophen) .[name].. 1 tab by mouth every 4 hours as needed 9)  Alprazolam 0.5 Mg Tabs (Alprazolam) .[name].. 1 tab by mouth three times a day as needed 10)  Anti-diarrheal 2 Mg Caps (Loperamide hcl) .... Take 2 ytabs by mouth prn 11)  Cyclobenzaprine Hcl 5 Mg Tabs (Cyclobenzaprine hcl) .[name].. 1 tab by mouth every 8 hours as needed 12)  Epipen 2-pak 0.3 Mg/0.2ml (1:1000) Devi (Epinephrine hcl (anaphylaxis)) .... Use asd 13)  Nystatin 100000 Unit/gm Powd (Nystatin) .... Use asd to affected area two times a day as needed 14)  Topamax 100 Mg Tabs (Topiramate) .... 3 by mouth once daily 15)  Flexeril 5 Mg Tabs (Cyclobenzaprine hcl) .[name].. 1po three times a day as needed 16)  Promethazine Hcl 25 Mg Tabs (Promethazine hcl) .[name].. 1 by mouth q 6 hrs as needed nausea 17)  Cortisporin-tc 3.07-18-08-0.5 Mg/ml Susp (Neomycin-colist-hc-thonzonium) .... 2 gtts qid for 10 days to right ear 18)  Vitamin C 500 Mg Tabs (Ascorbic acid) .[name].. 1 by mouth once daily 19)  Centrum Silver Ultra Womens Tabs (Multiple vitamins-minerals) .[name].. 1 by mouth once daily 20)  Glucosamine 500 Mg Caps (Glucosamine sulfate) .[name].. 1 by mouth once daily 21)  Colchicine 0.6 Mg Tabs (Colchicine) .[name].. 1 by mouth two times a day 22)  Lidoderm 5 % Ptch (Lidocaine) .... As needed 23)  Vitamin D 400 Unit Caps (Cholecalciferol) .[name].. 1 cap daily 24)  Aldactone 50  Mg Tabs (Spironolactone) .[name].. 1 by mouth once daily 25)  Prosthetic Sleeve Socket  .... Use asd 26)  Lomotil 2.5-0.025  Mg Tabs (Diphenoxylate-atropine) .[name].. 1 by mouth two times a day as needed 27)  Fish Oil 1000 Mg Caps (Omega-3 fatty acids) .[name].. 1 capsule by mouth once daily 28)  Metamucil 30.9 % Powd (Psyllium) .... Take as directed 29)  Epipen 2-pak 0.3 Mg/0.48ml Devi (Epinephrine) .... Use asd as needed 30)  Proair Hfa 108 (90 Base) Mcg/act Aers (Albuterol sulfate) .... 2 puffs q6 hrs as needed 31)  Cephalexin 500 Mg Caps (Cephalexin) .[name].. 1 by mouth three times a day x 10d  Patient Instructions: 1)  it was good to see you today. 2)  test(s) ordered today - your results will be posted on the phone tree for review in 48-72 hours from the time of test completion; call [phone] and enter your 9 digit MRN (listed above on this page, just below your name); if any changes need to be made or there are abnormal results, you will be contacted directly.  3)  antibioitcs as discussed - your prescriptions have been electronically submitted to your pharmacy. Please take as directed. Contact our office if you believe you're having problems with the medication(s). 4)  Get plenty of rest, drink lots of clear liquids, and use Tylenol or Ibuprofen for fever and comfort. 5)  Please schedule a follow-up appointment next week with Dr. Jonny Ruiz, call sooner if problems.  6)  if your symptoms continue to worsen (pain, fever, etc), or if you are unable take anything by mouth (pills, fluids, etc), you should go to the emergency room for further evaluation and treatment.  Prescriptions: CEPHALEXIN 500 MG CAPS (CEPHALEXIN) 1 by mouth three times a day x 10d  #30 x 0   Entered and Authorized by:   Newt Lukes MD   Signed by:   Newt Lukes MD on 02/07/2010   Method used:   Electronically to        [facility] (retail)       [phone]       [place], [place]  [id number]       Ph: [phone]       Fax: [phone]   RxID:   [id number]

## 2010-06-19 NOTE — Miscellaneous (Signed)
Summary: Discharge/[facility]  Discharge/[facility]   Imported By: Sherian Rein 03/19/2010 14:44:23  _____________________________________________________________________  External Attachment:    Type:   Image     Comment:   External Document

## 2010-06-19 NOTE — Assessment & Plan Note (Signed)
Summary: ONE MONTH FOLLOW UP-LB   Vital Signs:  Patient profile:   65 year old female Height:      69 inches Weight:      184 pounds BMI:     27.27 O2 Sat:      92 % on Room air Temp:     98 degrees F oral Pulse rate:   83 / minute BP sitting:   120 / 80  (left arm) Cuff size:   regular  Vitals Entered By: Zella Ball Ewing CMA Duncan Dull) (March 20, 2010 4:15 PM)  O2 Flow:  Room air CC: one month followup/RE   Primary Care Tianna Baus:  [name], MD   CC:  one month followup/RE.  History of Present Illness: here for f/u; overall doing ok without recurrance of confusion as before with recent pna, delerium, AKD with low volume in the setting of decr by mouth intake and ongoing diuretic;  also with significant low sodium and anemia;  states today no recurrance of fever but is wondering about UTI as she has polyurai slightly worse in the past 2 to 3 days though hard to tell as she is back on her chronic diuretic;  Pt denies CP, worsening sob, doe, wheezing, orthopnea, pnd, worsening LE edema, palps, dizziness or syncope  No cough or dysphagia.  Pt denies new neuro symptoms such as headache, facial or extremity weakness , though does have signficant LLE contracture with only 42% ROM per pt followed per Dr Almyra Brace. Not currently walking.   Was sent home with home o2 last visit, does not see now where it helps during the day, o2 sat good today, but friend with her (soone to be POA per pt) states she does better with sleeping with o2.  Pt requests a short time of regular home blood draws per [place] as well with her tendency to become low sodium and anemia.  Also mentions she wants me to remember if she is confused and has to be hospd in the future such as last episode that her pain meds not be d/c'd on admit to the hosp as to her this was a major problem .  Denies worsening depressive symptoms, suicidal ideation, or panic.  No fever, wt loss, night sweats, loss of appetite or other constitutional  symptoms   Problems Prior to Update: 1)  Hyponatremia  (ICD-276.1) 2)  Hypersomnia  (ICD-780.54) 3)  Chronic Pain Syndrome  (ICD-338.4) 4)  Pneumonia, Organism Unspecified  (ICD-486) 5)  Trigeminal Neuralgia  (ICD-350.1) 6)  Altered Mental Status  (ICD-780.97) 7)  Fever Unspecified  (ICD-780.60) 8)  Menopausal Disorder  (ICD-627.9) 9)  Fatigue  (ICD-780.79) 10)  Preventive Health Care  (ICD-V70.0) 11)  Wheezing  (ICD-786.07) 12)  Dysuria  (ICD-788.1) 13)  Hyperkalemia  (ICD-276.7) 14)  Renal Insufficiency  (ICD-588.9) 15)  Diarrhea  (ICD-787.91) 16)  Unspecified Otitis Media  (ICD-382.9) 17)  Skin Lesion  (ICD-709.9) 18)  Hearing Loss, 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  () 28)  Tubal Ligation, Hx of  (ICD-V26.51) 29)  Deviated Septum,surgery  (  ICD-470) 30)  Foot Surgery Back in 1980 and 1981  () 31)  Neuropathy, Hx of  (ICD-V12.40) 32)  Unspecified Hepatitis  (ICD-573.3) 33)  Arthritis  (ICD-716.90) 34)  CHF  (ICD-428.0) 35)  Sinusitis- Acute-nos  (ICD-461.9) 36)  Uri  (ICD-465.9) 37)  Acute Gouty Arthropathy  (ICD-274.01) 38)  Cellulitis, Left Leg  (ICD-682.6) 39)  Cellulitis, Leg, Left  (ICD-682.6) 40)  Abdominal Distension  (ICD-787.3) 41)  Weight Gain  (ICD-783.1) 42)  Peripheral Edema  (ICD-782.3) 43)  Foot Pain  (ICD-729.5) 44)  B12 Deficiency  (ICD-266.2) 45)  Preventive Health Care  (ICD-V70.0) 46)  Colonic Polyps, Hx of  (ICD-V12.72) 47)  Anxiety  (ICD-300.00) 48)  Gerd  (ICD-530.81) 49)  Rheumatoid Arthritis  (ICD-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 1 Patch Q 3 Days 2)  Benicar 40 Mg Tabs (Olmesartan Medoxomil) .[name].. 1  Tab Once Daily 3)  Cymbalta 60 Mg Cpep (Duloxetine Hcl) .... 2 Cap Qd 4)  Pantoprazole Sodium 40 Mg Tbec (Pantoprazole Sodium) .[name].. 1 By Mouth Once Daily 5)  Furosemide 80 Mg Tabs (Furosemide) .[name].. 1 By Mouth Two Times A Day 6)  Gabapentin 300 Mg Caps (Gabapentin) .... 2 By Mouth in The Am, 1 By Mouth in The Afternoon, Then 2 By Mouth in The Pm 7)  Oxycodone-Acetaminophen 10-325 Mg Tabs (Oxycodone-Acetaminophen) .[name].. 1 By Mouth Q 6 Hours As Needed 8)  Tylenol Extra Strength 500 Mg Tabs (Acetaminophen) .[name].. 1 Tab By Mouth Every 4 Hours As Needed 9)  Alprazolam 0.25 Mg Tabs (Alprazolam) .[name].. 1po Three Times A Day As Needed 10)  Anti-Diarrheal 2 Mg Caps (Loperamide Hcl) .... Take 2 Ytabs By Mouth Prn 11)  Cyclobenzaprine Hcl 5 Mg Tabs (Cyclobenzaprine Hcl) .[name].. 1 Tab By Mouth Every 8 Hours As Needed 12)  Epipen 2-Pak 0.3 Mg/0.12ml (1:1000) Devi (Epinephrine Hcl (Anaphylaxis)) .... Use Asd 13)  Nystatin 100000 Unit/gm Powd (Nystatin) .... Use Asd To Affected Area Two Times A Day As Needed 14)  Topamax 100 Mg Tabs (Topiramate) .... 3 By Mouth Once Daily 15)  Flexeril 5 Mg Tabs (Cyclobenzaprine Hcl) .[name].. 1po Three Times A Day As Needed 16)  Promethazine Hcl 25 Mg Tabs (Promethazine Hcl) .[name].. 1 By Mouth Q 6 Hrs As Needed Nausea 17)  Cortisporin-Tc 3.07-18-08-0.5 Mg/ml Susp (Neomycin-Colist-Hc-Thonzonium) .... 2 Gtts Qid For 10 Days To Right Ear 18)  Vitamin C 500 Mg Tabs (Ascorbic Acid) .[name].. 1 By Mouth Once Daily 19)  Centrum Silver Ultra Womens  Tabs (Multiple Vitamins-Minerals) .[name].. 1 By Mouth Once Daily 20)  Glucosamine 500 Mg Caps (Glucosamine Sulfate) .[name].. 1 By Mouth Once Daily 21)  Colchicine 0.6 Mg Tabs (Colchicine) .[name].. 1 By Mouth Two Times A Day 22)  Lidoderm 5  % Ptch (Lidocaine) .... As Needed 23)  Vitamin D 400 Unit Caps (Cholecalciferol) .[name].. 1 Cap Daily 24)  Aldactone 50 Mg Tabs (Spironolactone) .[name].. 1 By Mouth Once Daily 25)  Prosthetic Sleeve Socket .... Use Asd 26)  Lomotil 2.5-0.025 Mg Tabs (Diphenoxylate-Atropine) .[name].. 1 By Mouth Two Times A Day As Needed 27)  Fish Oil 1000 Mg Caps (Omega-3 Fatty Acids) .[name].. 1 Capsule By Mouth Once Daily 28)  Metamucil 30.9 % Powd (Psyllium) .... Take As Directed 29)  Epipen 2-Pak 0.3 Mg/0.20ml Devi (Epinephrine) .... Use Asd As Needed 30)  Proair Hfa 108 (90 Base) Mcg/act Aers (Albuterol Sulfate) .... 2 Puffs Q6 Hrs As Needed 31)  Cbc and Bmet For Monday Oct 3 .... 585.2 32)  Augmentin 875-125 Mg Tabs (Amoxicillin-Pot Clavulanate) .[name].. 1po Two Times A Day For 5 Days  Current Medications (verified): 1)  Fentanyl 75 Mcg/hr Pt72 (Fentanyl) .[name].. 1 Patch Q 3 Days 2)  Benicar 40 Mg Tabs (Olmesartan Medoxomil) .[name].. 1  Tab Once Daily 3)  Cymbalta 60 Mg Cpep (Duloxetine Hcl) .... 2 Cap Qd 4)  Pantoprazole Sodium 40 Mg Tbec (Pantoprazole Sodium) .[name].. 1 By Mouth Once Daily 5)  Furosemide 80 Mg Tabs (Furosemide) .[name].. 1 By Mouth Two Times A Day 6)  Gabapentin 300 Mg Caps (Gabapentin) .... 2 By Mouth in The Am, 1 By Mouth in The Afternoon, Then 2 By Mouth in The Pm 7)  Oxycodone-Acetaminophen 10-325 Mg Tabs (Oxycodone-Acetaminophen) .[name].. 1 By Mouth Q 6 Hours As Needed 8)  Tylenol Extra Strength 500 Mg Tabs (Acetaminophen) .[name].. 1 Tab By Mouth Every 4 Hours As Needed 9)  Alprazolam 0.25 Mg Tabs (Alprazolam) .[name].. 1po Three Times A Day As Needed 10)  Anti-Diarrheal 2 Mg Caps (Loperamide Hcl) .... Take 2 Ytabs By Mouth Prn 11)  Cyclobenzaprine Hcl 5 Mg Tabs (Cyclobenzaprine Hcl) .[name].. 1 Tab By Mouth Every 8 Hours As Needed 12)  Epipen 2-Pak 0.3 Mg/0.20ml (1:1000) Devi (Epinephrine Hcl (Anaphylaxis)) .... Use Asd 13)  Nystatin 100000 Unit/gm Powd (Nystatin) .... Use Asd To Affected Area Two Times A Day As Needed 14)  Topamax  100 Mg Tabs (Topiramate) .... 3 By Mouth Once Daily 15)  Flexeril 5 Mg Tabs (Cyclobenzaprine Hcl) .[name].. 1po Three Times A Day As Needed 16)  Promethazine Hcl 25 Mg Tabs (Promethazine Hcl) .[name].. 1 By Mouth Q 6 Hrs As Needed Nausea 17)  Cortisporin-Tc 3.07-18-08-0.5 Mg/ml Susp (Neomycin-Colist-Hc-Thonzonium) .... 2 Gtts Qid For 10 Days To Right Ear 18)  Vitamin C 500 Mg Tabs (Ascorbic Acid) .[name].. 1 By Mouth Once Daily 19)  Centrum Silver Ultra Womens  Tabs (Multiple Vitamins-Minerals) .[name].. 1 By Mouth Once Daily 20)  Glucosamine 500 Mg Caps (Glucosamine Sulfate) .[name].. 1 By Mouth Once Daily 21)  Colchicine 0.6 Mg Tabs (Colchicine) .[name].. 1 By Mouth Two Times A Day 22)  Lidoderm 5 % Ptch (Lidocaine) .... As Needed 23)  Vitamin D 400 Unit Caps (Cholecalciferol) .[name].. 1 Cap Daily 24)  Aldactone 50 Mg Tabs (Spironolactone) .[name].. 1 By Mouth Once Daily 25)  Prosthetic Sleeve Socket .... Use Asd 26)  Lomotil 2.5-0.025 Mg Tabs (Diphenoxylate-Atropine) .[name].. 1 By Mouth Two Times A Day As Needed 27)  Fish Oil 1000 Mg Caps (Omega-3 Fatty Acids) .[name].. 1 Capsule By Mouth Once Daily 28)  Metamucil 30.9 % Powd (Psyllium) .... Take As Directed 29)  Epipen 2-Pak 0.3 Mg/0.39ml Devi (Epinephrine) .... Use Asd As Needed 30)  Proair Hfa 108 (90 Base) Mcg/act Aers (Albuterol Sulfate) .... 2 Puffs Q6 Hrs As Needed 31)  Cbc and Bmet For Monday Oct 3 .... 585.2 32)  Augmentin 875-125 Mg Tabs (Amoxicillin-Pot Clavulanate) .[name].. 1po Two Times A Day For 5 Days  Allergies (verified): 1)  ! * All Anti-Inflammatories 2)  ! * All Steroids 3)  ! Sulfa 4)  ! Erythromycin 5)  ! Cipro 6)  ! Morphine 7)  ! * Depomedrol  Past History:  Past Medical History: Last updated: 02/14/2010 ARTHRITIS (ICD-716.90) CHF (ICD-428.0) GOUTY ARTHROPATHY (ICD-274.01) PERIPHERAL EDEMA (ICD-782.3) B12 DEFICIENCY (ICD-266.2) ANXIETY (ICD-300.00)  GERD  RHEUMATOID ARTHRITIS (  ICD-714.0) ANEMIA-NOS (ICD-285.9) PERIPHERAL NEUROPATHY  (ICD-356.9) HYPERTENSION (ICD-401.9) DIABETES MELLITUS, TYPE II (ICD-250.00) * 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 left ankle charcot joint CHRONIC DYSPHAGIA IBS Renal insufficiency trigeminal neuralgia hx MD Roster:   rehab - Dr Hermelinda Medicus                     optho -  Dr Tacy Dura                       GI - Dr Arlyce Dice                    derm - Dr Doylene Canning                    ENT - Dr Jenne Pane                    card - Dr Excell Seltzer chronic pain chronic venous insufficiency   Anemia-NOS  Past Surgical History: Last updated: 03/19/2009 Current Problems:  AMPUTATION, BELOW KNEE, RIGHT, HX OF (ICD-V49.75) (02/02/2008) * S/P EGD 2007 WITH BOTOX FOR ? 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  Alcohol Use - no Drug use-no  Risk Factors: Smoking Status: quit (01/28/2009)  Review of Systems       all otherwise negative per pt -    Physical Exam  General:  alert and overweight-appearing.   Head:  normocephalic and atraumatic.   Eyes:  vision grossly intact, pupils equal, and pupils round.   Ears:  R ear normal and L ear normal.   Nose:  no external deformity and no nasal discharge.   Mouth:  no gingival abnormalities and pharynx pink and moist.   Neck:  supple and no masses.   Lungs:  normal respiratory effort, R decreased breath sounds, and L decreased breath sounds.   Heart:  normal rate and regular rhythm.   Abdomen:  soft, non-tender, and normal bowel sounds.   Msk:  no flank tender bilat Extremities:  LLE with chronic mild edema, s/p right ampuation Psych:  not depressed appearing and moderately anxious.     Impression &  Recommendations:  Problem # 1:  HYPONATREMIA (ICD-276.1) mild recurrent, ok for blood draws as per pt request Orders: TLB-BMP (Basic Metabolic Panel-BMET) (80048-METABOL) Misc. Referral (Misc. Ref)  Problem # 2:  ANEMIA-NOS (ICD-285.9) no overt bleeding, o/w stable overall by hx and exam, ok to continue meds/tx as is , ok for home blood draw q mo for 3 mo after lab today as well Orders: TLB-CBC Platelet - w/Differential (85025-CBCD) TLB-IBC Pnl (Iron/FE;Transferrin) (83550-IBC) Misc. Referral (Misc. Ref)  Problem # 3:  RENAL INSUFFICIENCY (ICD-588.9) recnet AKD, to f/u with bmet above, also for UA for ? worsened polyuria/urinary freq  Orders: TLB-Udip w/ Micro (81001-URINE)  Problem # 4:  CHF (ICD-428.0) Assessment: Unchanged  Her updated medication list for this problem includes:    Benicar 40 Mg Tabs (Olmesartan medoxomil) .[name]... 1  tab once daily    Furosemide 80 Mg Tabs (Furosemide) .[name]... 1 by mouth two times a day    Aldactone 50 Mg Tabs (Spironolactone) .[name]... 1 by mouth once daily stable  overall by hx and exam, ok to continue meds/tx as is   Echocardiogram:     --------------------------------------------------------------------     Study Conclusions            1. Left ventricle: The cavity size was normal. Wall thickness was        increased in a pattern of mild LVH. Systolic function was normal.        The estimated ejection fraction was in the range of 60% to 65%.        Wall motion was normal; there were no regional wall motion        abnormalities. Doppler parameters are consistent with abnormal        left ventricular relaxation (grade 1 diastolic dysfunction).     2. Left atrium: The atrium was mildly dilated.     3. Pulmonary arteries: No TR doppler jet was measured so unable to        estimate PA systolic pressure.     4. Inferior vena cava: The vessel was normal in size; the        respirophasic diameter changes were in the normal range (= 50%);         findings are consistent with normal central venous pressure.     Echocardiography. M-mode, complete 2D, spectral Doppler, and color     Doppler. Height: Height: 177.8cm. Height: 70in. Weight: Weight:     124.7kg. Weight: 274.4lb. Body mass index: BMI: 39.5kg/m 2. Body     surface area: BSA: 2.49m 2. Patient status: Inpatient. Location:     [facility].        (11/02/2008)  Problem # 5:  PNEUMONIA, ORGANISM UNSPECIFIED (ICD-486)  The following medications were removed from the medication list:    Augmentin 875-125 Mg Tabs (Amoxicillin-pot clavulanate) .[name]... 1po two times a day for 5 days resolved clinically, ok for home o2 to likely be d/c'd duringt he day,but will ask for ONO to assess for ovenight hypoxia, consider OSA eval as well  Complete Medication List: 1)  Fentanyl 75 Mcg/hr Pt72 (Fentanyl) .[name].. 1 patch q 3 days 2)  Benicar 40 Mg Tabs (Olmesartan medoxomil) .[name].. 1  tab once daily 3)  Cymbalta 60 Mg Cpep (Duloxetine hcl) .... 2 cap qd 4)  Pantoprazole Sodium 40 Mg Tbec (Pantoprazole sodium) .[name].. 1 by mouth once daily 5)  Furosemide 80 Mg Tabs (Furosemide) .[name].. 1 by mouth two times a day 6)  Gabapentin 300 Mg Caps (Gabapentin) .... 2 by mouth in the am, 1 by mouth in the afternoon, then 2 by mouth in the pm 7)  Oxycodone-acetaminophen 10-325 Mg Tabs (Oxycodone-acetaminophen) .[name].. 1 by mouth q 6 hours as needed 8)  Tylenol Extra Strength 500 Mg Tabs (Acetaminophen) .[name].. 1 tab by mouth every 4 hours as needed 9)  Alprazolam 0.25 Mg Tabs (Alprazolam) .[name].. 1po three times a day as needed 10)  Anti-diarrheal 2 Mg Caps (Loperamide hcl) .... Take 2 ytabs by mouth prn 11)  Cyclobenzaprine Hcl 5 Mg Tabs (Cyclobenzaprine hcl) .[name].. 1 tab by mouth every 8 hours as needed 12)  Epipen 2-pak 0.3 Mg/0.44ml (1:1000) Devi (Epinephrine hcl (anaphylaxis)) .... Use asd 13)  Nystatin 100000 Unit/gm Powd (Nystatin) .... Use asd to affected area two times a day as needed 14)  Topamax 100 Mg Tabs  (Topiramate) .... 3 by mouth once daily 15)  Flexeril 5 Mg Tabs (Cyclobenzaprine hcl) .[name].. 1po three times a day as needed 16)  Promethazine Hcl 25 Mg Tabs (Promethazine  hcl) .... 1 by mouth q 6 hrs as needed nausea 17)  Cortisporin-tc 3.07-18-08-0.5 Mg/ml Susp (Neomycin-colist-hc-thonzonium) .... 2 gtts qid for 10 days to right ear 18)  Vitamin C 500 Mg Tabs (Ascorbic acid) .[name].. 1 by mouth once daily 19)  Centrum Silver Ultra Womens Tabs (Multiple vitamins-minerals) .[name].. 1 by mouth once daily 20)  Glucosamine 500 Mg Caps (Glucosamine sulfate) .[name].. 1 by mouth once daily 21)  Colchicine 0.6 Mg Tabs (Colchicine) .[name].. 1 by mouth two times a day 22)  Lidoderm 5 % Ptch (Lidocaine) .... As needed 23)  Vitamin D 400 Unit Caps (Cholecalciferol) .[name].. 1 cap daily 24)  Aldactone 50 Mg Tabs (Spironolactone) .[name].. 1 by mouth once daily 25)  Prosthetic Sleeve Socket  .... Use asd 26)  Lomotil 2.5-0.025 Mg Tabs (Diphenoxylate-atropine) .[name].. 1 by mouth two times a day as needed 27)  Fish Oil 1000 Mg Caps (Omega-3 fatty acids) .[name].. 1 capsule by mouth once daily 28)  Metamucil 30.9 % Powd (Psyllium) .... Take as directed 29)  Epipen 2-pak 0.3 Mg/0.52ml Devi (Epinephrine) .... Use asd as needed 30)  Proair Hfa 108 (90 Base) Mcg/act Aers (Albuterol sulfate) .... 2 puffs q6 hrs as needed 31)  Nitrofurantoin Macrocrystal 100 Mg Caps (Nitrofurantoin macrocrystal) .[name].. 1 by mouth two times a day  Patient Instructions: 1)  Continue all previous medications as before this visit 2)  Please go to the Lab in the basement for your blood and/or urine tests today 3)  Please call the number on the [facility] Card for results of your testing 4)  We will have the [facility] people draw blood monthly for the next 3 months after this blood draw, as well the overnight oxygen testing 5)  Please schedule a follow-up appointment in 6 months or sooner if needed   Orders Added: 1)  TLB-BMP (Basic Metabolic Panel-BMET) [80048-METABOL] 2)   TLB-Udip w/ Micro [81001-URINE] 3)  TLB-CBC Platelet - w/Differential [85025-CBCD] 4)  TLB-IBC Pnl (Iron/FE;Transferrin) [83550-IBC] 5)  Est. Patient Level IV [16109] 6)  Misc. Referral [Misc. Ref] 7)  Misc. Referral [Misc. Ref]

## 2010-06-19 NOTE — Consult Note (Signed)
Summary: [facility] Ear Nose [facility]ro[facility]  [facility] Ear Nose Throat   Imported By: Sherian Rein 06/20/2009 11:22:25  _____________________________________________________________________  External Attachment:    Type:   Image     Comment:   External Document

## 2010-06-19 NOTE — Progress Notes (Signed)
  Phone Note Call from Patient Call back at [facility] Phone [phone]   Summary of Call: Clydie Prentiss is req a call back.  Initial call taken by: Lamar Sprinkles, CMA,  October 25, 2009 5:24 PM  Follow-up for Phone Call        Spoke w/Karen, Pt's pain dr, did labs tuesday and told pt that [name] may want to change pt's diuretic. Pt had elevated kid function labs per daughter. Have you seen these?  Follow-up by: Lamar Sprinkles, CMA,  October 25, 2009 5:36 PM  Additional Follow-up for Phone Call Additional follow up Details #1::        ok to decrease the lasix to once per day; I have asked robin already to have pt make OV  Additional Follow-up by: Corwin Levins MD,  October 25, 2009 7:10 PM    Additional Follow-up for Phone Call Additional follow up Details #2::    called pt left msg to call back Follow-up by: Scharlene Gloss,  October 28, 2009 11:48 AM  Additional Follow-up for Phone Call Additional follow up Details #3:: Details for Additional Follow-up Action Taken: pt informed, transferred to sch appt Additional Follow-up by: Margaret Pyle, CMA,  October 28, 2009 12:06 PM

## 2010-06-19 NOTE — Progress Notes (Signed)
Summary: Diarrhea  Phone Note Call from Patient Call back at Home Phone [phone]   Caller: Patient Summary of Call: pt called stating that she is still experiencing diarrhea that is only slighly relieved by Lomotil. pt says that she start having very loose stools again last night and took 1 lomotil, that did not help, so pt took Immodium. pt states that Immodium helped and she would like to know if she can continue with Immodium. Initial call taken by: Margaret Pyle, CMA,  June 11, 2009 9:55 AM  Follow-up for Phone Call        Called to notify patient that she can continue with Imodium. Patient would like to know if MD is gonna prescribe another med they discussed at ov for diarrhea if Lomotil didn't help. Please advise. Follow-up by: Lucious Groves,  June 11, 2009 10:25 AM  Additional Follow-up for Phone Call Additional follow up Details #1::        ok for immodium as needed;  let us know if she wants referral to GI Additional Follow-up by: Corwin Levins MD,  June 11, 2009 12:42 PM    Additional Follow-up for Phone Call Additional follow up Details #2::    Left message on voicemail notifying. Follow-up by: Lucious Groves,  June 11, 2009 2:22 PM

## 2010-06-19 NOTE — Progress Notes (Signed)
Summary: UTI  Phone Note Call from Patient Call back at Home Phone [phone]   Caller: Patient Summary of Call: Pt called stating that UTI has returned with severe dysuria. Pt is requesting either a refill of ABX or an order for [facility] to collect sample for UA and Ucx. Pt says she will not have transportation for possible OV until after holiday, please advise. Initial call taken by: Margaret Pyle, CMA,  April 07, 2010 2:14 PM  Follow-up for Phone Call        need ua and culture - 588.1 Follow-up by: Corwin Levins MD,  April 07, 2010 3:01 PM  Additional Follow-up for Phone Call Additional follow up Details #1::        Order placed,signed and faxed to [facility]. Pt informed Additional Follow-up by: Margaret Pyle, CMA,  April 08, 2010 8:57 AM

## 2010-06-19 NOTE — Progress Notes (Signed)
Summary: Allergic reaction?  Phone Note Call from Patient Call back at [phone]   Caller: Patient--[phone] Call For: Corwin Levins MD Summary of Call: P thinks she is having allergic reaction to anitbiotic, itching all over. Please advise.  Follow-up for Phone Call        stop current nitrofurantoin  take benadryl 50 mg by mouth q 6 hrs as needed itch or rash or swelling  start cephalexin course - done per emr Follow-up by: Corwin Levins MD,  March 24, 2010 1:11 PM  Additional Follow-up for Phone Call Additional follow up Details #1::        Message left on pt's VM to call back discuss with friend Clydie Stiger. Detailed VM left on pt's friend's cell phone Clydie Heimsoth [phone] per HIPAA form Additional Follow-up by: Margaret Pyle, CMA,  March 24, 2010 2:41 PM   New Allergies: ! NITROFURANTOIN MACROCRYSTAL (NITROFURANTOIN MACROCRYSTAL) New/Updated Medications: CEPHALEXIN 500 MG CAPS (CEPHALEXIN) 1po three times a day for 6 days New Allergies: ! NITROFURANTOIN MACROCRYSTAL (NITROFURANTOIN MACROCRYSTAL)Prescriptions: CEPHALEXIN 500 MG CAPS (CEPHALEXIN) 1po three times a day for 6 days  #18 x 0   Entered and Authorized by:   Corwin Levins MD   Signed by:   Corwin Levins MD on 03/24/2010   Method used:   Electronically to        [facility] (retail)       [phone]       [place], [place]  [id number]       Ph: [phone]       Fax: [phone]   RxID:   [id number]   Appended Document: Allergic reaction? Pt called back and was informed of above

## 2010-06-19 NOTE — Progress Notes (Signed)
Summary: medication refill  Phone Note Refill Request Message from:  Fax from Pharmacy on April 04, 2010 4:20 PM  Refills Requested: Medication #1:  LOMOTIL 2.5-0.025 MG TABS 1 by mouth two times a day as needed   Dosage confirmed as above?Dosage Confirmed   Last Refilled: 01/01/2010   Notes: [facility] [phone] Initial call taken by: Zella Ball Ewing CMA Duncan Dull),  April 04, 2010 4:21 PM  Follow-up for Phone Call        faxed hardcopy to pharmacy Follow-up by: Robin Ewing CMA Duncan Dull),  April 04, 2010 4:29 PM    Prescriptions: LOMOTIL 2.5-0.025 MG TABS (DIPHENOXYLATE-ATROPINE) 1 by mouth two times a day as needed  #60 x 1   Entered and Authorized by:   Corwin Levins MD   Signed by:   Corwin Levins MD on 04/04/2010   Method used:   Print then Give to Patient   RxID:   [id number]  done hardcopy to [facility] side B - dahlia  Corwin Levins MD  April 04, 2010 4:27 PM

## 2010-06-19 NOTE — Miscellaneous (Signed)
Summary: Orders Update  Clinical Lists Changes  Problems: Added new problem of MENOPAUSAL DISORDER (ICD-627.9) Orders: Added new Test order of T-Bone Densitometry [id number]) - Signed

## 2010-06-19 NOTE — Assessment & Plan Note (Signed)
Summary: FU--STC   Vital Signs:  Patient profile:   65 year old female Height:      70 inches Weight:      275 pounds BMI:     39.60 O2 Sat:      97 % on Room air Temp:     99 degrees F oral Pulse rate:   90 / minute BP sitting:   110 / 56  (left arm) Cuff size:   large  Vitals Entered ByMarland Kitchen Zella Ball Ewing (June 04, 2009 2:47 PM)  O2 Flow:  Room air CC: followup/RE   Primary Care Provider:  [name], MD   CC:  followup/RE.  History of Present Illness: had episode of diarrhea today - has hx of IBS and latley with frank diarrhea  but only when she has to leave the house  - but has been increased lately,  no abd pain, n/v, wt loss or blood;  Pt denies CP, sob, doe, wheezing, orthopnea, pnd, worsening LE edema, palps, dizziness or syncope   Pt denies new neuro symptoms such as headache, facial or extremity weakness   c/o hearing loss on the right - ? wax involved. Has left earache with mild fever for 3 days.  Also new 2 months right nasal skin lesion that just does not heal.  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  () 13)  Tubal Ligation, Hx of  (ICD-V26.51) 14)  Deviated Septum,surgery  (ICD-470) 15)  Foot Surgery Back in 1980 and 1981  () 16)  Neuropathy, Hx of  (ICD-V12.40) 17)  Unspecified Hepatitis  (ICD-573.3) 18)  Arthritis  (ICD-716.90) 19)  CHF  (ICD-428.0) 20)  Sinusitis- Acute-nos  (ICD-461.9) 21)  Uri  (ICD-465.9) 22)  Acute Gouty Arthropathy  (ICD-274.01) 23)  Cellulitis, Left Leg   (ICD-682.6) 24)  Cellulitis, Leg, Left  (ICD-682.6) 25)  Abdominal Distension  (ICD-787.3) 26)  Weight Gain  (ICD-783.1) 27)  Peripheral Edema  (ICD-782.3) 28)  Foot Pain  (ICD-729.5) 29)  B12 Deficiency  (ICD-266.2) 30)  Preventive Health Care  (ICD-V70.0) 31)  Colonic Polyps, Hx of  (ICD-V12.72) 32)  Anxiety  (ICD-300.00) 33)  Gerd  (ICD-530.81) 34)  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 1 Patch Q 3 Days 2)  Benicar 40 Mg Tabs (Olmesartan Medoxomil) .... 1/2  Tab Once Daily 3)  Cymbalta 60 Mg Cpep (Duloxetine Hcl) .... 2 Cap Qd 4)  Pantoprazole Sodium 40 Mg Tbec (Pantoprazole Sodium) .[name].. 1 By Mouth Once Daily 5)  Furosemide 80 Mg Tabs (Furosemide) .[name].. 1 By Mouth Two Times A Day 6)  Gabapentin 300 Mg Caps (Gabapentin) .... 2 By Mouth in The Am, 1 By Mouth in The Afternoon, Then 2 By Mouth in The Pm 7)  Oxycodone-Acetaminophen  10-325 Mg Tabs (Oxycodone-Acetaminophen) .[name].. 1 By Mouth Q 6 Hours As Needed 8)  Tylenol Extra Strength 500 Mg Tabs (Acetaminophen) .[name].. 1 Tab By Mouth Every 4 Hours As Needed 9)  Alprazolam 0.5 Mg Tabs (Alprazolam) .[name].. 1 Tab By Mouth Three Times A Day As Needed 10)  Anti-Diarrheal 2 Mg Caps (Loperamide Hcl) .... Take 2 Ytabs By Mouth Prn 11)  Cyclobenzaprine Hcl 5 Mg Tabs (Cyclobenzaprine Hcl) .[name].. 1 Tab By Mouth Every 8 Hours As Needed 12)  Epipen 2-Pak 0.3 Mg/0.25ml (1:1000) Devi (Epinephrine Hcl (Anaphylaxis)) .... Use Asd 13)  Nystatin 100000 Unit/gm Powd (Nystatin) .... Use Asd To Affected Area Two Times A Day As Needed 14)  Topamax 100 Mg Tabs (Topiramate) .... 3 By Mouth Once Daily 15)  Flexeril 5 Mg Tabs (Cyclobenzaprine Hcl) .[name].. 1po Three Times A Day As Needed 16)  Promethazine Hcl 25 Mg Tabs (Promethazine Hcl) .[name].. 1 By Mouth Q 6 Hrs  As Needed Nausea 17)  Cortisporin-Tc 3.07-18-08-0.5 Mg/ml Susp (Neomycin-Colist-Hc-Thonzonium) .... 2 Gtts Qid For 10 Days To Right Ear 18)  Vitamin C 500 Mg Tabs (Ascorbic Acid) .[name].. 1 By Mouth Once Daily 19)  Centrum Silver Ultra Womens  Tabs (Multiple Vitamins-Minerals) .[name].. 1 By Mouth Once Daily 20)  Glucosamine 500 Mg Caps (Glucosamine Sulfate) .[name].. 1 By Mouth Once Daily 21)  Colchicine 0.6 Mg Tabs (Colchicine) .[name].. 1 By Mouth Two Times A Day 22)  Lidoderm 5 % Ptch (Lidocaine) .... As Needed 23)  Vitamin D 400 Unit Caps (Cholecalciferol) .[name].. 1 Cap Daily 24)  Aldactone 50 Mg Tabs (Spironolactone) .[name].. 1 By Mouth Once Daily 25)  Prosthetic Sleeve Socket .... Use Asd 26)  Moviprep 100 Gm  Solr (Peg-Kcl-Nacl-Nasulf-Na Asc-C) .... As Per Prep Instructions.  Current Medications (verified): 1)  Fentanyl 75 Mcg/hr Pt72 (Fentanyl) .[name].. 1 Patch Q 3 Days 2)  Benicar 40 Mg Tabs (Olmesartan Medoxomil) .... 1/2  Tab Once Daily 3)  Cymbalta 60 Mg Cpep (Duloxetine Hcl) .... 2 Cap Qd 4)  Pantoprazole Sodium 40 Mg Tbec (Pantoprazole Sodium) .[name].. 1 By Mouth Once Daily 5)  Furosemide 80 Mg Tabs (Furosemide) .[name].. 1 By Mouth Two Times A Day 6)  Gabapentin 300 Mg Caps (Gabapentin) .... 2 By Mouth in The Am, 1 By Mouth in The Afternoon, Then 2 By Mouth in The Pm 7)  Oxycodone-Acetaminophen 10-325 Mg Tabs (Oxycodone-Acetaminophen) .[name].. 1 By Mouth Q 6 Hours As Needed 8)  Tylenol Extra Strength 500 Mg Tabs (Acetaminophen) .[name].. 1 Tab By Mouth Every 4 Hours As Needed 9)  Alprazolam 0.5 Mg Tabs (Alprazolam) .[name].. 1 Tab By Mouth Three Times A Day As Needed 10)  Anti-Diarrheal 2 Mg Caps (Loperamide Hcl) .... Take 2 Ytabs By Mouth Prn 11)  Cyclobenzaprine Hcl 5 Mg Tabs (Cyclobenzaprine Hcl) .[name].. 1 Tab By Mouth Every 8 Hours As Needed 12)  Epipen 2-Pak 0.3 Mg/0.45ml (1:1000) Devi (Epinephrine Hcl (Anaphylaxis)) .... Use Asd 13)  Nystatin 100000 Unit/gm Powd (Nystatin) .... Use Asd To Affected Area Two Times A Day As  Needed 14)  Topamax 100 Mg Tabs (Topiramate) .... 3 By Mouth Once Daily 15)  Flexeril 5 Mg Tabs (Cyclobenzaprine Hcl) .[name].. 1po Three Times A Day As Needed 16)  Promethazine Hcl 25 Mg Tabs (Promethazine Hcl) .[name].. 1 By Mouth Q 6 Hrs As Needed Nausea 17)  Cortisporin-Tc 3.07-18-08-0.5 Mg/ml Susp (Neomycin-Colist-Hc-Thonzonium) .... 2 Gtts Qid For 10 Days To Right Ear 18)  Vitamin C 500 Mg Tabs (Ascorbic Acid) .[name].. 1 By Mouth Once  Daily 19)  Centrum Silver Ultra Womens  Tabs (Multiple Vitamins-Minerals) .[name].. 1 By Mouth Once Daily 20)  Glucosamine 500 Mg Caps (Glucosamine Sulfate) .[name].. 1 By Mouth Once Daily 21)  Colchicine 0.6 Mg Tabs (Colchicine) .[name].. 1 By Mouth Two Times A Day 22)  Lidoderm 5 % Ptch (Lidocaine) .... As Needed 23)  Vitamin D 400 Unit Caps (Cholecalciferol) .[name].. 1 Cap Daily 24)  Aldactone 50 Mg Tabs (Spironolactone) .[name].. 1 By Mouth Once Daily 25)  Prosthetic Sleeve Socket .... Use Asd 26)  Moviprep 100 Gm  Solr (Peg-Kcl-Nacl-Nasulf-Na Asc-C) .... As Per Prep Instructions. 27)  Lomotil 2.5-0.025 Mg Tabs (Diphenoxylate-Atropine) .[name].. 1 By Mouth Two Times A Day As Needed 28)  Azithromycin 250 Mg Tabs (Azithromycin) .... 2po Qd For 1 Day, Then 1po Qd For 4days, Then Stop  Allergies (verified): 1)  ! * All Anti-Inflammatories 2)  ! * All Steroids 3)  ! Sulfa 4)  ! Erythromycin 5)  ! Cipro 6)  ! Morphine  Past History:  Past Surgical History: Last updated: 03/19/2009 Current Problems:  AMPUTATION, BELOW KNEE, RIGHT, HX OF (ICD-V49.75) (02/02/2008) * S/P EGD 2007 WITH BOTOX FOR ? 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  Alcohol Use - no  Risk Factors: Smoking Status:  quit (01/28/2009)  Past Medical History: Current Problems:  NEUROPATHY, HX OF (ICD-V12.40) UNSPECIFIED HEPATITIS (ICD-573.3) ARTHRITIS (ICD-716.90) CHF (ICD-428.0) SINUSITIS- ACUTE-NOS (ICD-461.9) URI (ICD-465.9) ACUTE GOUTY ARTHROPATHY (ICD-274.01) CELLULITIS, LEFT LEG (ICD-682.6) CELLULITIS, LEG, LEFT (ICD-682.6) ABDOMINAL DISTENSION (ICD-787.3) WEIGHT GAIN (ICD-783.1) PERIPHERAL EDEMA (ICD-782.3) FOOT PAIN (ICD-729.5) B12 DEFICIENCY (ICD-266.2) PREVENTIVE HEALTH CARE (ICD-V70.0) COLONIC POLYPS, HX OF (ICD-V12.72) ANXIETY (ICD-300.00) GERD  RHEUMATOID ARTHRITIS (ICD-714.0) ANEMIA-NOS (ICD-285.9) PERIPHERAL NEUROPATHY (ICD-356.9) HYPERTENSION (ICD-401.9) DIABETES MELLITUS, TYPE II (ICD-250.00) * 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 left ankle charcot joint CHRONIC DYSPHAGIA IBS  Review of Systems       all otherwise negative per pt -  Physical Exam  General:  alert and overweight-appearing.   Head:  normocephalic and atraumatic.   Eyes:  vision grossly intact, pupils equal, and pupils round.   Ears:  left tm marked erythema, right tm ok without wax Nose:  no external deformity and no nasal discharge.   Mouth:  no gingival abnormalities and pharynx pink and moist.   Neck:  supple and no masses.   Lungs:  normal respiratory effort and normal breath sounds.   Heart:  normal rate and regular rhythm.   Abdomen:  soft, non-tender, and normal bowel sounds.   Extremities:  no edema, no erythema  Skin:  right nose with 1/4 cm raised fleshy lesion, without ulceration   Impression & Recommendations:  Problem # 1:  HEARING LOSS, RIGHT EAR (ICD-389.9)  exam benign - to refer to ENT  Orders: ENT Referral (ENT)  Problem # 2:  IBS (ICD-564.1) Assessment: New ok for lomotil only when laving the house; recent colnoscopy neg, conside rifaxin  Problem # 3:  SKIN LESION (ICD-709.9)  right nose - refer derm  Orders: Dermatology  Referral (Derma)  Problem # 4:  UNSPECIFIED OTITIS MEDIA (ICD-382.9)  Her updated medication list for this problem includes:    Tylenol Extra Strength 500 Mg Tabs (Acetaminophen) .Marland KitchenMarland KitchenMarland KitchenMarland Kitchen  1 tab by mouth every 4 hours as needed    Azithromycin 250 Mg Tabs (Azithromycin) .[name]... 2po qd for 1 day, then 1po qd for 4days, then stop left  - for zpack, f/u any worsening s/s  Problem # 5:  DIABETES MELLITUS, TYPE II (ICD-250.00)  Her updated medication list for this problem includes:    Benicar 40 Mg Tabs (Olmesartan medoxomil) .[name]... 1/2  tab once daily  Orders: TLB-BMP (Basic Metabolic Panel-BMET) (80048-METABOL) TLB-Lipid Panel (80061-LIPID) TLB-A1C / Hgb A1C (Glycohemoglobin) (83036-A1C)  Complete Medication List: 1)  Fentanyl 75 Mcg/hr Pt72 (Fentanyl) .[name].. 1 patch q 3 days 2)  Benicar 40 Mg Tabs (Olmesartan medoxomil) .... 1/2  tab once daily 3)  Cymbalta 60 Mg Cpep (Duloxetine hcl) .... 2 cap qd 4)  Pantoprazole Sodium 40 Mg Tbec (Pantoprazole sodium) .[name].. 1 by mouth once daily 5)  Furosemide 80 Mg Tabs (Furosemide) .[name].. 1 by mouth two times a day 6)  Gabapentin 300 Mg Caps (Gabapentin) .... 2 by mouth in the am, 1 by mouth in the afternoon, then 2 by mouth in the pm 7)  Oxycodone-acetaminophen 10-325 Mg Tabs (Oxycodone-acetaminophen) .[name].. 1 by mouth q 6 hours as needed 8)  Tylenol Extra Strength 500 Mg Tabs (Acetaminophen) .[name].. 1 tab by mouth every 4 hours as needed 9)  Alprazolam 0.5 Mg Tabs (Alprazolam) .[name].. 1 tab by mouth three times a day as needed 10)  Anti-diarrheal 2 Mg Caps (Loperamide hcl) .... Take 2 ytabs by mouth prn 11)  Cyclobenzaprine Hcl 5 Mg Tabs (Cyclobenzaprine hcl) .[name].. 1 tab by mouth every 8 hours as needed 12)  Epipen 2-pak 0.3 Mg/0.14ml (1:1000) Devi (Epinephrine hcl (anaphylaxis)) .... Use asd 13)  Nystatin 100000 Unit/gm Powd (Nystatin) .... Use asd to affected area two times a day as needed 14)  Topamax 100 Mg Tabs (Topiramate) .... 3 by mouth once daily 15)   Flexeril 5 Mg Tabs (Cyclobenzaprine hcl) .[name].. 1po three times a day as needed 16)  Promethazine Hcl 25 Mg Tabs (Promethazine hcl) .[name].. 1 by mouth q 6 hrs as needed nausea 17)  Cortisporin-tc 3.07-18-08-0.5 Mg/ml Susp (Neomycin-colist-hc-thonzonium) .... 2 gtts qid for 10 days to right ear 18)  Vitamin C 500 Mg Tabs (Ascorbic acid) .[name].. 1 by mouth once daily 19)  Centrum Silver Ultra Womens Tabs (Multiple vitamins-minerals) .[name].. 1 by mouth once daily 20)  Glucosamine 500 Mg Caps (Glucosamine sulfate) .[name].. 1 by mouth once daily 21)  Colchicine 0.6 Mg Tabs (Colchicine) .[name].. 1 by mouth two times a day 22)  Lidoderm 5 % Ptch (Lidocaine) .... As needed 23)  Vitamin D 400 Unit Caps (Cholecalciferol) .[name].. 1 cap daily 24)  Aldactone 50 Mg Tabs (Spironolactone) .[name].. 1 by mouth once daily 25)  Prosthetic Sleeve Socket  .... Use asd 26)  Moviprep 100 Gm Solr (Peg-kcl-nacl-nasulf-na asc-c) .... As per prep instructions. 27)  Lomotil 2.5-0.025 Mg Tabs (Diphenoxylate-atropine) .[name].. 1 by mouth two times a day as needed 28)  Azithromycin 250 Mg Tabs (Azithromycin) .... 2po qd for 1 day, then 1po qd for 4days, then stop  Patient Instructions: 1)  take metamucil daily 2)  Please take all new medications as prescribed- the zpack, lomotil and metamucil 3)  Continue all previous medications as before this visit  4)  Please go to the Lab in the basement for your blood and/or urine tests today  5)  You will be contacted about the referral(s) to: ENT and dermatology 6)  Please schedule a follow-up appointment in  6 months for medicare "yearly exam" Prescriptions: AZITHROMYCIN 250 MG TABS (AZITHROMYCIN) 2po qd for 1 day, then 1po qd for 4days, then stop  #6 x 1   Entered and Authorized by:   Corwin Levins MD   Signed by:   Corwin Levins MD on 06/04/2009   Method used:   Print then Give to Patient   RxID:   [id number] LOMOTIL 2.5-0.025 MG TABS (DIPHENOXYLATE-ATROPINE) 1 by mouth two times a day as needed  #60 x  0   Entered and Authorized by:   Corwin Levins MD   Signed by:   Corwin Levins MD on 06/04/2009   Method used:   Print then Give to Patient   RxID:   [id number]

## 2010-06-19 NOTE — Assessment & Plan Note (Signed)
Summary: F/U APPT..LSW.   History of Present Illness Visit Type: Follow-up Visit Primary GI MD: Melvia Heaps MD [facility] Primary Provider: [name], MD  Requesting Provider: n/a Chief Complaint: follow-up episodes of diarrhea  History of Present Illness:   Crystal Fitzpatrick has returned for followup of her diarrhea.  Symptoms have significantly improved with the addition of florastor.  She also limited dairy products which she thinks has improved her symptoms as well.  Stool studies were negative for C. difficile toxin.   GI Review of Systems    Reports bloating and  nausea.      Denies abdominal pain, acid reflux, belching, chest pain, dysphagia with liquids, dysphagia with solids, heartburn, loss of appetite, vomiting, vomiting blood, weight loss, and  weight gain.      Reports diarrhea.     Denies anal fissure, black tarry stools, change in bowel habit, constipation, diverticulosis, fecal incontinence, heme positive stool, hemorrhoids, irritable bowel syndrome, jaundice, light color stool, liver problems, rectal bleeding, and  rectal pain.    Current Medications (verified): 1)  Fentanyl 75 Mcg/hr Pt72 (Fentanyl) .[name].. 1 Patch Q 3 Days 2)  Benicar 40 Mg Tabs (Olmesartan Medoxomil) .... 1/2  Tab Once Daily 3)  Cymbalta 60 Mg Cpep (Duloxetine Hcl) .... 2 Cap Qd 4)  Pantoprazole Sodium 40 Mg Tbec (Pantoprazole Sodium) .[name].. 1 By Mouth Once Daily 5)  Furosemide 80 Mg Tabs (Furosemide) .[name].. 1 By Mouth Two Times A Day 6)  Gabapentin 300 Mg Caps (Gabapentin) .... 2 By Mouth in The Am, 1 By Mouth in The Afternoon, Then 2 By Mouth in The Pm 7)  Oxycodone-Acetaminophen 10-325 Mg Tabs (Oxycodone-Acetaminophen) .[name].. 1 By Mouth Q 6 Hours As Needed 8)  Tylenol Extra Strength 500 Mg Tabs (Acetaminophen) .[name].. 1 Tab By Mouth Every 4 Hours As Needed 9)  Alprazolam 0.5 Mg Tabs (Alprazolam) .[name].. 1 Tab By Mouth Three Times A Day As Needed 10)  Anti-Diarrheal 2 Mg Caps (Loperamide Hcl) .... Take 2 Ytabs By Mouth  Prn 11)  Cyclobenzaprine Hcl 5 Mg Tabs (Cyclobenzaprine Hcl) .[name].. 1 Tab By Mouth Every 8 Hours As Needed 12)  Epipen 2-Pak 0.3 Mg/0.54ml (1:1000) Devi (Epinephrine Hcl (Anaphylaxis)) .... Use Asd 13)  Nystatin 100000 Unit/gm Powd (Nystatin) .... Use Asd To Affected Area Two Times A Day As Needed 14)  Topamax 100 Mg Tabs (Topiramate) .... 3 By Mouth Once Daily 15)  Flexeril 5 Mg Tabs (Cyclobenzaprine Hcl) .[name].. 1po Three Times A Day As Needed 16)  Promethazine Hcl 25 Mg Tabs (Promethazine Hcl) .[name].. 1 By Mouth Q 6 Hrs As Needed Nausea 17)  Cortisporin-Tc 3.07-18-08-0.5 Mg/ml Susp (Neomycin-Colist-Hc-Thonzonium) .... 2 Gtts Qid For 10 Days To Right Ear 18)  Vitamin C 500 Mg Tabs (Ascorbic Acid) .[name].. 1 By Mouth Once Daily 19)  Centrum Silver Ultra Womens  Tabs (Multiple Vitamins-Minerals) .[name].. 1 By Mouth Once Daily 20)  Glucosamine 500 Mg Caps (Glucosamine Sulfate) .[name].. 1 By Mouth Once Daily 21)  Colchicine 0.6 Mg Tabs (Colchicine) .[name].. 1 By Mouth Two Times A Day 22)  Lidoderm 5 % Ptch (Lidocaine) .... As Needed 23)  Vitamin D 400 Unit Caps (Cholecalciferol) .[name].. 1 Cap Daily 24)  Aldactone 50 Mg Tabs (Spironolactone) .[name].. 1 By Mouth Once Daily 25)  Prosthetic Sleeve Socket .... Use Asd 26)  Lomotil 2.5-0.025 Mg Tabs (Diphenoxylate-Atropine) .[name].. 1 By Mouth Two Times A Day As Needed 27)  Fish Oil 1000 Mg Caps (Omega-3 Fatty Acids) .[name].. 1 Capsule By Mouth Once Daily  28)  Metamucil 30.9 % Powd (Psyllium) .... Take As Directed  Allergies (verified): 1)  ! * All Anti-Inflammatories 2)  ! * All Steroids 3)  ! Sulfa 4)  ! Erythromycin 5)  ! Cipro 6)  ! Morphine  Past History:  Past Medical History: Reviewed history from 06/04/2009 and no changes required. Current Problems:  NEUROPATHY, HX OF (ICD-V12.40) UNSPECIFIED HEPATITIS (ICD-573.3) ARTHRITIS (ICD-716.90) CHF (ICD-428.0) SINUSITIS- ACUTE-NOS (ICD-461.9) URI (ICD-465.9) ACUTE GOUTY ARTHROPATHY (ICD-274.01) CELLULITIS, LEFT LEG  (ICD-682.6) CELLULITIS, LEG, LEFT (ICD-682.6) ABDOMINAL DISTENSION (ICD-787.3) WEIGHT GAIN (ICD-783.1) PERIPHERAL EDEMA (ICD-782.3) FOOT PAIN (ICD-729.5) B12 DEFICIENCY (ICD-266.2) PREVENTIVE HEALTH CARE (ICD-V70.0) COLONIC POLYPS, HX OF (ICD-V12.72) ANXIETY (ICD-300.00) GERD  RHEUMATOID ARTHRITIS (ICD-714.0) ANEMIA-NOS (ICD-285.9) PERIPHERAL NEUROPATHY (ICD-356.9) HYPERTENSION (ICD-401.9) DIABETES MELLITUS, TYPE II (ICD-250.00) * 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 left ankle charcot joint CHRONIC DYSPHAGIA IBS  Past Surgical History: Reviewed history from 03/19/2009 and no changes required. 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 Father: died with colon cancer age 36 Mother:died with breast cancer age 98, as well other in extended family. Family History of Diabetes, stoke (Grandparents) Sister: breast cancer   Social History: Reviewed history from 01/28/2009 and no changes required. Divorced Retired - Marketing executive Divorced -1 son Tobacco Use - Former.  Alcohol Use - no  Review of Systems       The patient complains of allergy/sinus.  The patient denies anemia, anxiety-new, arthritis/joint pain, back pain, blood in urine, breast changes/lumps, change in vision, confusion, cough, coughing up blood, depression-new, fainting, fatigue, fever, headaches-new, hearing problems, heart murmur, heart rhythm changes, itching, muscle pains/cramps, night sweats, nosebleeds, shortness of breath, skin rash, sleeping problems, sore throat, swelling of feet/legs, swollen  lymph glands, thirst - excessive, urination - excessive, urination changes/pain, urine leakage, vision changes, and voice change.    Vital Signs:  Patient profile:   65 year old female Height:      70 inches Pulse rate:   80 / minute Pulse rhythm:   regular BP sitting:   94 / 52  (left arm)  Vitals Entered By: Milford Cage NCMA (August 05, 2009 2:56 PM)   Impression & Recommendations:  Problem # 1:  DIARRHEA (ICD-787.91) Assessment Improved She likely had an antibiotic associated diarrhea although not specifically Pseudomonas colitis.  Recommendations #1 continue florastor for one to 2 weeks #2 Lactaid supplementation as needed  Patient Instructions: 1)  The medication list was reviewed and reconciled.  All changed / newly prescribed medications were explained.  A complete medication list was provided to the patient / caregiver.

## 2010-06-19 NOTE — Progress Notes (Signed)
Summary: CONDITION UPDATE  Phone Note Call from Patient Call back at Home Phone [phone]   Caller: Patient Call For: Dr. Arlyce Dice Reason for Call: Talk to Nurse Summary of Call: Pt is having diarrhea Initial call taken by: Karna Christmas,  July 24, 2009 3:36 PM  Follow-up for Phone Call        Last OV 07-09-09. Took Florastor two times a day for 14 days. Stopped Florastor on 07-22-09.  No diarrhea while she was on Florastor.  Has had 1 semi-formed/watery stool "It was a blast of stool" since 07-22-09.  1) Continue probiotic 1 daily 2) Get started on a fiber supplement, start today. 3) Callback with an update on 07-29-09, sooner as needed.  Follow-up by: Laureen Ochs LPN,  July 24, 2009 5:17 PM  Additional Follow-up for Phone Call Additional follow up Details #1::        ok  Additional Follow-up by: Louis Meckel MD,  July 25, 2009 8:41 AM

## 2010-06-19 NOTE — Letter (Signed)
Summary: [facility]   Imported By: Lester Natalia 06/15/2009 10:27:48  _____________________________________________________________________  External Attachment:    Type:   Image     Comment:   External Document

## 2010-06-19 NOTE — Progress Notes (Signed)
Summary: medication refill  Phone Note Refill Request Message from:  Fax from Pharmacy on January 01, 2010 3:35 PM  Refills Requested: Medication #1:  LOMOTIL 2.5-0.025 MG TABS 1 by mouth two times a day as needed   Dosage confirmed as above?Dosage Confirmed   Last Refilled: 08/06/2009   Notes: [facility] [phone] Initial call taken by: Zella Ball Ewing CMA Duncan Dull),  January 01, 2010 3:36 PM    Prescriptions: LOMOTIL 2.5-0.025 MG TABS (DIPHENOXYLATE-ATROPINE) 1 by mouth two times a day as needed  #60 x 1   Entered and Authorized by:   Corwin Levins MD   Signed by:   Corwin Levins MD on 01/01/2010   Method used:   Print then Give to Patient   RxID:   [id number]  done hardcopy to LIM side B - dahlia  Corwin Levins MD  January 01, 2010 5:12 PM   Rx faxed to pharmacy Margaret Pyle, CMA  January 02, 2010 7:49 AM

## 2010-06-19 NOTE — Progress Notes (Signed)
Summary: Return of diarrhea  Phone Note Call from Patient Call back at Home Phone [phone]   Call For: DR Arlyce Dice Summary of Call: Is having a return of the diarrhea, does not respect any of the medicines she has been taking to control it. Initial call taken by: Leanor Kail [facility],  Oct 02, 2009 3:24 PM  Follow-up for Phone Call        Attempted to reach pt. and phone was breaking up and we became disconnectd.    Teryl Lucy RN  Oct 02, 2009 4:53 PM  Attempted to reach pt. again this am and phone did the same thing.Message put on work cell phone at [phone] to return my call Teryl Lucy RN  Oct 03, 2009 11:52 AM.  .

## 2010-06-19 NOTE — Progress Notes (Signed)
Summary: Med Refill  Phone Note Refill Request  on August 06, 2009 1:15 PM  Refills Requested: Medication #1:  LOMOTIL 2.5-0.025 MG TABS 1 by mouth two times a day as needed   Dosage confirmed as above?Dosage Confirmed   Notes: [facility] [phone] Initial call taken by: Scharlene Gloss,  August 06, 2009 1:15 PM  Follow-up for Phone Call        RX faxed to pharmacy Follow-up by: Margaret Pyle, CMA,  August 06, 2009 1:35 PM    Prescriptions: LOMOTIL 2.5-0.025 MG TABS (DIPHENOXYLATE-ATROPINE) 1 by mouth two times a day as needed  #60 x 0   Entered and Authorized by:   Corwin Levins MD   Signed by:   Corwin Levins MD on 08/06/2009   Method used:   Print then Give to Patient   RxID:   [id number]  done hardcopy to LIM side B - dahlia Corwin Levins MD  August 06, 2009 1:32 PM

## 2010-06-19 NOTE — Progress Notes (Signed)
Summary: Medication Refill  Phone Note Refill Request Message from:  Fax from Pharmacy on November 25, 2009 2:19 PM  Refills Requested: Medication #1:  ALPRAZOLAM 0.5 MG TABS 1 tab by mouth three times a day as needed   Dosage confirmed as above?Dosage Confirmed   Last Refilled: 04/29/2009   Notes: [facility], [phone] Initial call taken by: Zella Ball Ewing CMA Duncan Dull),  November 25, 2009 2:19 PM  Follow-up for Phone Call        faxed hardcopy to pharmacy Follow-up by: Robin Ewing CMA Duncan Dull),  November 25, 2009 2:51 PM    New/Updated Medications: ALPRAZOLAM 0.5 MG TABS (ALPRAZOLAM) 1 tab by mouth three times a day as needed Prescriptions: ALPRAZOLAM 0.5 MG TABS (ALPRAZOLAM) 1 tab by mouth three times a day as needed  #90 x 2   Entered and Authorized by:   Corwin Levins MD   Signed by:   Corwin Levins MD on 11/25/2009   Method used:   Print then Give to Patient   RxID:   [id number]  done hardcopy to LIM side B - dahlia  Corwin Levins MD  November 25, 2009 2:47 PM

## 2010-06-19 NOTE — Miscellaneous (Signed)
Summary: Missed Visit/[facility]  Missed Visit/[facility]   Imported By: Sherian Rein 03/04/2010 13:38:31  _____________________________________________________________________  External Attachment:    Type:   Image     Comment:   External Document

## 2010-06-19 NOTE — Progress Notes (Signed)
Summary: FYI BP  Phone Note Other Incoming   Caller: Raylene Everts with [name] [phone] Summary of Call: HHRN called on request of pt to report BP reading today (first visit); 152/80 and 160/80. HHRN is aware that pt recently started Benicar 40mg  1/2 tab once daily. Initial call taken by: Margaret Pyle, CMA,  February 18, 2010 1:45 PM  Follow-up for Phone Call        ok to follow for now;  call with BP in 1 wk please Follow-up by: Corwin Levins MD,  February 18, 2010 3:18 PM  Additional Follow-up for Phone Call Additional follow up Details #1::        HHRN advised to call back with BP in 1 wk via VM Additional Follow-up by: Margaret Pyle, CMA,  February 18, 2010 3:22 PM

## 2010-06-19 NOTE — Assessment & Plan Note (Signed)
Summary: YEARLY FU / MEDICARE / LABS SAME DAY /NWS   Vital Signs:  Patient profile:   65 year old female Height:      70 inches Weight:      270 pounds BMI:     38.88 O2 Sat:      94 % on Room air Temp:     97.2 degrees F oral Pulse rate:   77 / minute BP sitting:   110 / 62  (left arm) Cuff size:   large  Vitals Entered By: Zella Ball Ewing CMA Duncan Dull) (December 03, 2009 10:38 AM)  O2 Flow:  Room air  Preventive Care Screening  Colonoscopy:    Next Due:  05/2019  CC: yearly followup/RE   Primary Care Provider:  [name], MD   CC:  yearly followup/RE.  History of Present Illness: here after cleaning the floor recently and exposed to some fumes that cuased some increaed wheezing and sob just after and persisted for the last few days;  overall mild and no change in activity level; does c/o contracture to left knee where she cant completely extend - PT is working with her , to see rehab physician tomorrow as well;  Pt denies CP, sob, doe, wheezing, orthopnea, pnd, worsening LE edema, palps, dizziness or syncope  Pt denies new neuro symptoms such as headache, facial or extremity weakness   No fever, wt loss, night sweats, loss of appetite or other constitutional symptoms   Here for wellness Diet: Heart Healthy or DM if diabetic Physical Activities: Sedentary Depression/mood screen: Negative Hearing: mild decreased bilateral Visual Acuity: Grossly normal, gets exam q 6 mo, has cataracts no ready for surgury yet ADL's: Capable with toileting, shower, cooking, some cleaning, feeding Fall Risk: in wheelchair, only mild with transfers;  plan is for eventual right leg prosthesis Home Safety: Good Cognitive Impairment:  Gen appearance, affect, speech, memory, attention & motor skills grossly intact End-of-Life Planning: Advance directive - Full code/I agree   Preventive Screening-Counseling & Management      Drug Use:  no.    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  () 17)  Tubal Ligation, Hx of  (ICD-V26.51) 18)  Deviated Septum,surgery  (ICD-470) 19)  Foot Surgery Back in 1980 and 1981  () 20)  Neuropathy, Hx of  (ICD-V12.40) 21)  Unspecified Hepatitis  (ICD-573.3) 22)  Arthritis  (ICD-716.90) 23)  CHF  (ICD-428.0) 24)  Sinusitis- Acute-nos  (ICD-461.9) 25)  Uri  (ICD-465.9) 26)  Acute Gouty Arthropathy  (ICD-274.01) 27)  Cellulitis, Left Leg  (ICD-682.6) 28)  Cellulitis, Leg, Left  (ICD-682.6) 29)  Abdominal Distension  (ICD-787.3) 30)  Weight Gain  (ICD-783.1) 31)  Peripheral Edema  (ICD-782.3) 32)  Foot Pain  (ICD-729.5) 33)  B12 Deficiency  (ICD-266.2) 34)  Preventive Health Care  (ICD-V70.0) 35)  Colonic Polyps, Hx of  (ICD-V12.72) 36)  Anxiety  (ICD-300.00) 37)  Gerd  (ICD-530.81) 38)  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 1 Patch Q 3 Days 2)  Benicar 40 Mg Tabs (Olmesartan Medoxomil) .... 1/2  Tab Once Daily 3)  Cymbalta 60 Mg Cpep (Duloxetine Hcl) .... 2 Cap Qd 4)  Pantoprazole Sodium 40 Mg Tbec (Pantoprazole Sodium) .[name].. 1 By Mouth Once Daily 5)  Furosemide 80 Mg Tabs (Furosemide) .[name].. 1 By Mouth Two Times A Day 6)  Gabapentin 300  Mg Caps (Gabapentin) .... 2 By Mouth in The Am, 1 By Mouth in The Afternoon, Then 2 By Mouth in The Pm 7)  Oxycodone-Acetaminophen 10-325 Mg Tabs (Oxycodone-Acetaminophen) .[name].. 1 By Mouth Q 6 Hours As Needed 8)  Tylenol Extra Strength 500 Mg Tabs (Acetaminophen) .[name].. 1 Tab By Mouth Every 4 Hours As Needed 9)  Alprazolam 0.5 Mg Tabs (Alprazolam) .[name].. 1 Tab By Mouth Three Times A Day As Needed 10)  Anti-Diarrheal 2 Mg Caps (Loperamide Hcl) .... Take 2 Ytabs By Mouth Prn 11)  Cyclobenzaprine Hcl 5 Mg Tabs (Cyclobenzaprine Hcl) .[name].. 1 Tab By Mouth Every 8 Hours As Needed 12)  Epipen 2-Pak 0.3 Mg/0.68ml (1:1000) Devi (Epinephrine Hcl (Anaphylaxis)) .... Use Asd 13)  Nystatin 100000 Unit/gm Powd (Nystatin) .... Use Asd To Affected Area Two Times A Day As Needed 14)  Topamax 100 Mg Tabs (Topiramate) .... 3 By Mouth Once Daily 15)  Flexeril 5 Mg Tabs (Cyclobenzaprine Hcl) .[name].. 1po Three Times A Day As Needed 16)  Promethazine Hcl 25 Mg Tabs (Promethazine Hcl) .[name].. 1 By Mouth Q 6 Hrs As Needed Nausea 17)  Cortisporin-Tc 3.07-18-08-0.5 Mg/ml Susp (Neomycin-Colist-Hc-Thonzonium) .... 2 Gtts Qid For 10 Days To Right Ear 18)  Vitamin C 500 Mg Tabs (Ascorbic Acid) .[name].. 1 By Mouth Once Daily 19)  Centrum Silver Ultra Womens  Tabs (Multiple Vitamins-Minerals) .[name].. 1 By Mouth Once Daily 20)  Glucosamine 500 Mg Caps (Glucosamine Sulfate) .[name].. 1 By Mouth Once Daily 21)  Colchicine 0.6 Mg Tabs (Colchicine) .[name].. 1 By Mouth Two Times A Day 22)  Lidoderm 5 % Ptch (Lidocaine) .... As Needed 23)  Vitamin D 400 Unit Caps (Cholecalciferol) .[name].. 1 Cap Daily 24)  Aldactone 50 Mg Tabs (Spironolactone) .[name].. 1 By Mouth Once Daily 25)  Prosthetic Sleeve Socket .... Use Asd 26)  Lomotil 2.5-0.025 Mg Tabs (Diphenoxylate-Atropine) .[name].. 1 By Mouth Two Times A Day As Needed 27)  Fish Oil 1000 Mg Caps (Omega-3 Fatty Acids) .[name].. 1 Capsule By Mouth Once Daily 28)  Metamucil 30.9 % Powd (Psyllium) .... Take As Directed 29)  Cephalexin  500 Mg Caps (Cephalexin) .[name].. 1po Three Times A Day  Current Medications (verified): 1)  Fentanyl 75 Mcg/hr Pt72 (Fentanyl) .[name].. 1 Patch Q 3 Days 2)  Benicar 40 Mg Tabs (Olmesartan Medoxomil) .... 1/2  Tab Once Daily 3)  Cymbalta 60 Mg Cpep (Duloxetine Hcl) .... 2 Cap Qd 4)  Pantoprazole Sodium 40 Mg Tbec (Pantoprazole Sodium) .[name].. 1 By Mouth Once Daily 5)  Furosemide 80 Mg Tabs (Furosemide) .[name].. 1 By Mouth Two Times A Day 6)  Gabapentin 300 Mg Caps (Gabapentin) .... 2 By Mouth in The Am, 1 By Mouth in The Afternoon, Then 2 By Mouth in The Pm 7)  Oxycodone-Acetaminophen 10-325 Mg  Tabs (Oxycodone-Acetaminophen) .[name].. 1 By Mouth Q 6 Hours As Needed 8)  Tylenol Extra Strength 500 Mg Tabs (Acetaminophen) .[name].. 1 Tab By Mouth Every 4 Hours As Needed 9)  Alprazolam 0.5 Mg Tabs (Alprazolam) .[name].. 1 Tab By Mouth Three Times A Day As Needed 10)  Anti-Diarrheal 2 Mg Caps (Loperamide Hcl) .... Take 2 Ytabs By Mouth Prn 11)  Cyclobenzaprine Hcl 5 Mg Tabs (Cyclobenzaprine Hcl) .[name].. 1 Tab By Mouth Every 8 Hours As Needed 12)  Epipen 2-Pak 0.3 Mg/0.45ml (1:1000) Devi (Epinephrine Hcl (Anaphylaxis)) .... Use Asd 13)  Nystatin 100000 Unit/gm Powd (Nystatin) .... Use Asd To Affected Area Two Times A Day As Needed 14)  Topamax 100 Mg Tabs (Topiramate) .... 3 By Mouth Once Daily 15)  Flexeril 5 Mg Tabs (Cyclobenzaprine Hcl) .[name].. 1po Three Times A Day As Needed 16)  Promethazine Hcl 25 Mg Tabs (Promethazine Hcl) .[name].. 1 By Mouth Q 6 Hrs As Needed Nausea 17)  Cortisporin-Tc 3.07-18-08-0.5 Mg/ml Susp (Neomycin-Colist-Hc-Thonzonium) .... 2 Gtts Qid For 10 Days To Right Ear 18)  Vitamin C 500 Mg Tabs (Ascorbic Acid) .[name].. 1 By Mouth Once Daily 19)  Centrum Silver Ultra Womens  Tabs (Multiple Vitamins-Minerals) .[name].. 1 By Mouth Once Daily 20)  Glucosamine 500 Mg Caps (Glucosamine Sulfate) .[name].. 1 By Mouth Once Daily 21)  Colchicine 0.6 Mg Tabs (Colchicine) .[name].. 1 By Mouth Two Times A Day 22)  Lidoderm 5 % Ptch (Lidocaine) ....  As Needed 23)  Vitamin D 400 Unit Caps (Cholecalciferol) .[name].. 1 Cap Daily 24)  Aldactone 50 Mg Tabs (Spironolactone) .[name].. 1 By Mouth Once Daily 25)  Prosthetic Sleeve Socket .... Use Asd 26)  Lomotil 2.5-0.025 Mg Tabs (Diphenoxylate-Atropine) .[name].. 1 By Mouth Two Times A Day As Needed 27)  Fish Oil 1000 Mg Caps (Omega-3 Fatty Acids) .[name].. 1 Capsule By Mouth Once Daily 28)  Metamucil 30.9 % Powd (Psyllium) .... Take As Directed 29)  Epipen 2-Pak 0.3 Mg/0.50ml Devi (Epinephrine) .... Use Asd As Needed 30)  Proair Hfa 108 (90 Base) Mcg/act Aers (Albuterol Sulfate) .... 2 Puffs Q6 Hrs As Needed  Allergies (verified): 1)  ! * All Anti-Inflammatories 2)  ! * All Steroids 3)  ! Sulfa 4)  ! Erythromycin 5)  ! Cipro 6)  ! Morphine 7)  ! * Depomedrol  Past History:  Family History: Last updated: 05-29-09 Father: died with colon cancer age 9 Mother:died with breast cancer age 55, as well other in extended family. 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  Alcohol Use - no Drug use-no  Risk Factors: Smoking Status: quit (01/28/2009)  Past Medical History: Current Problems:  NEUROPATHY, HX OF (ICD-V12.40) UNSPECIFIED HEPATITIS (ICD-573.3) ARTHRITIS (ICD-716.90) CHF (ICD-428.0) SINUSITIS- ACUTE-NOS (ICD-461.9) URI (ICD-465.9) ACUTE GOUTY ARTHROPATHY (ICD-274.01) CELLULITIS, LEFT LEG (ICD-682.6) CELLULITIS, LEG, LEFT (ICD-682.6) ABDOMINAL DISTENSION (ICD-787.3) WEIGHT GAIN (ICD-783.1) PERIPHERAL EDEMA (ICD-782.3) FOOT PAIN (ICD-729.5) B12 DEFICIENCY (ICD-266.2) PREVENTIVE HEALTH CARE (ICD-V70.0) COLONIC POLYPS, HX OF (ICD-V12.72) ANXIETY (ICD-300.00) GERD  RHEUMATOID ARTHRITIS (ICD-714.0) ANEMIA-NOS (ICD-285.9) PERIPHERAL NEUROPATHY (ICD-356.9) HYPERTENSION (ICD-401.9) DIABETES MELLITUS, TYPE II (ICD-250.00) *  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 left ankle charcot joint CHRONIC DYSPHAGIA IBS Renal insufficiency MD Roster:   rehab - Dr Hermelinda Medicus                     optho -  Dr Tacy Dura                       GI - Dr Arlyce Dice                    derm - Dr Doylene Canning                    ENT - Dr Jenne Pane                    card - Dr Excell Seltzer         Past Surgical History: Reviewed history from 03/19/2009 and no changes required. 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 Divorced Retired - Marketing executive Divorced -1 son Tobacco Use - Former.  Alcohol Use - no Drug use-no Drug Use:  no  Review of Systems  The patient denies anorexia, fever, weight loss, weight gain, vision loss, decreased hearing, hoarseness, chest pain, syncope, dyspnea on exertion, peripheral edema, prolonged cough, headaches, hemoptysis, abdominal pain, melena, hematochezia, severe indigestion/heartburn, hematuria, muscle weakness, suspicious skin lesions, transient blindness, difficulty walking, depression, unusual weight change, abnormal bleeding, enlarged lymph nodes, and angioedema.         all otherwise negative per pt -  except for mild ongoing fatigue without OSA symptoms  Physical Exam  General:  alert and overweight-appearing.   Head:  normocephalic and atraumatic.   Eyes:  vision grossly intact, pupils equal, and pupils round.   Ears:  R ear normal and L ear normal.   Nose:  no external deformity and no nasal discharge.   Mouth:  no gingival abnormalities and pharynx pink and moist.   Neck:  supple and no  masses.   Lungs:  normal respiratory effort and normal breath sounds.   Heart:  normal rate and regular rhythm.   Abdomen:  soft, non-tender, and normal bowel sounds.   Msk:  no joint tenderness and no joint swelling.   but has mild lef tknee ocntracture  - full extnesion reduced by 20 degrees, Extremities:  LLE with 1+ edema Neurologic:  cranial nerves II-XII intact and strength normal in all extremities.     Impression & Recommendations:  Problem # 1:  Preventive Health Care (ICD-V70.0)  Overall doing well, age appropriate education and counseling updated and referral for appropriate preventive services done unless declined, immunizations up to date or declined, diet counseling done if overweight, urged to quit smoking if smokes , most recent labs reviewed and current ordered if appropriate, ecg reviewed or declined (interpretation per ECG scanned in the EMR if done); information regarding Medicare Prevention requirements given if appropriate; speciality referrals updated as appropriate   Orders: EKG w/ Interpretation (93000) First annual wellness visit with prevention plan  (M8413)  Problem # 2:  WHEEZING (ICD-786.07)  for proair hfa as needed,  declines prednisoe  or depomedrol   Orders: Prescription Created Electronically [phone])  Problem # 3:  DIABETES MELLITUS, TYPE II (ICD-250.00)  Her updated medication list for this problem includes:    Benicar 40 Mg Tabs (Olmesartan medoxomil) .[name]... 1/2  tab once daily  Labs Reviewed: Creat:  1.7 (11/07/2009)    Reviewed HgBA1c results: 6.2 (06/04/2009)  6.0 (11/27/2008) stable overall by hx and exam, ok to continue meds/tx as is, Pt to cont DM diet, excercise, wt loss efforts; to check labs today   Orders: TLB-Microalbumin/Creat Ratio, Urine (82043-MALB) TLB-Lipid Panel (80061-LIPID) TLB-A1C / Hgb A1C (Glycohemoglobin) (83036-A1C)  Problem # 4:  HYPERTENSION (ICD-401.9)  Her updated medication list for this problem includes:     Benicar 40 Mg Tabs (Olmesartan medoxomil) .[name]... 1/2  tab once daily    Furosemide 80 Mg Tabs (Furosemide) .[name]... 1 by mouth two times a day    Aldactone 50 Mg Tabs (Spironolactone) .[name]... 1 by mouth once daily  BP today: 110/62 Prior BP: 110/62 (10/31/2009)  Labs Reviewed: K+: 4.8 (11/07/2009) Creat: : 1.7 (11/07/2009)   Chol: 197 (06/04/2009)   HDL: 28.00 (06/04/2009)   LDL: 102 (07/17/2008)   TG: 524.0 (06/04/2009) stable overall by hx and exam, ok to continue meds/tx as is   Problem # 5:  FATIGUE (ICD-780.79)  exam benign, to check labs below; follow with expectant management   Orders: TLB-BMP (Basic Metabolic Panel-BMET) (80048-METABOL) TLB-CBC Platelet - w/Differential (85025-CBCD) TLB-Hepatic/Liver Function Pnl (80076-HEPATIC) TLB-Sedimentation Rate (ESR) (85652-ESR) TLB-TSH (Thyroid Stimulating Hormone) (84443-TSH) TLB-Udip ONLY (81003-UDIP) T-Vitamin D (25-Hydroxy) (29528-41324)  Complete Medication List: 1)  Fentanyl 75 Mcg/hr Pt72 (Fentanyl) .[name].. 1 patch q 3 days 2)  Benicar 40 Mg Tabs (Olmesartan medoxomil) .... 1/2  tab once daily 3)  Cymbalta 60 Mg Cpep (Duloxetine hcl) .... 2 cap qd 4)  Pantoprazole Sodium 40 Mg Tbec (Pantoprazole sodium) .[name].. 1 by mouth once daily 5)  Furosemide 80 Mg Tabs (Furosemide) .[name].. 1 by mouth two times a day 6)  Gabapentin 300 Mg Caps (Gabapentin) .... 2 by mouth in the am, 1 by mouth in the afternoon, then 2 by mouth in the pm 7)  Oxycodone-acetaminophen 10-325 Mg Tabs (Oxycodone-acetaminophen) .[name].. 1 by mouth q 6 hours as needed 8)  Tylenol Extra Strength 500 Mg Tabs (Acetaminophen) .[name].. 1 tab by mouth every 4 hours as needed 9)  Alprazolam 0.5 Mg Tabs (Alprazolam) .[name].. 1 tab by mouth three times a day as needed 10)  Anti-diarrheal 2 Mg Caps (Loperamide hcl) .... Take 2 ytabs by mouth prn 11)  Cyclobenzaprine Hcl 5 Mg Tabs (Cyclobenzaprine hcl) .[name].. 1 tab by mouth every 8 hours as needed 12)  Epipen 2-pak 0.3 Mg/0.82ml (1:1000) Devi  (Epinephrine hcl (anaphylaxis)) .... Use asd 13)  Nystatin 100000 Unit/gm Powd (Nystatin) .... Use asd to affected area two times a day as needed 14)  Topamax 100 Mg Tabs (Topiramate) .... 3 by mouth once daily 15)  Flexeril 5 Mg Tabs (Cyclobenzaprine hcl) .[name].. 1po three times a day as needed 16)  Promethazine Hcl 25 Mg Tabs (Promethazine hcl) .[name].. 1 by mouth q 6 hrs as needed nausea 17)  Cortisporin-tc 3.07-18-08-0.5 Mg/ml Susp (Neomycin-colist-hc-thonzonium) .... 2 gtts qid for 10 days to right ear 18)  Vitamin C 500 Mg Tabs (Ascorbic acid) .[name].. 1 by mouth once daily 19)  Centrum Silver Ultra Womens Tabs (Multiple vitamins-minerals) .[name].. 1 by mouth once daily 20)  Glucosamine 500 Mg Caps (Glucosamine sulfate) .[name].. 1 by mouth once daily 21)  Colchicine 0.6 Mg Tabs (Colchicine) .[name].. 1 by mouth two times a day 22)  Lidoderm 5 % Ptch (Lidocaine) .... As needed 23)  Vitamin D 400 Unit Caps (Cholecalciferol) .[name].. 1 cap daily 24)  Aldactone 50 Mg Tabs (Spironolactone) .[name].. 1 by mouth once daily 25)  Prosthetic Sleeve Socket  .... Use asd 26)  Lomotil 2.5-0.025 Mg Tabs (Diphenoxylate-atropine) .[name].. 1 by mouth two times a day as needed 27)  Fish Oil 1000 Mg Caps (Omega-3 fatty acids) .[name].. 1 capsule by mouth once daily 28)  Metamucil 30.9 % Powd (Psyllium) .... Take as directed 29)  Epipen 2-pak 0.3 Mg/0.52ml Devi (Epinephrine) .... Use asd as needed 30)  Proair Hfa 108 (90 Base) Mcg/act Aers (Albuterol sulfate) .... 2 puffs q6 hrs as needed   Patient Instructions: 1)  Your EKG was good today 2)  please schedule the bone density test before leaving today 3)  Please go to the Lab in the basement for your blood and/or urine tests today  4)  Please schedule a follow-up appointment in 6 months. Prescriptions: PROAIR HFA 108 (90 BASE) MCG/ACT AERS (ALBUTEROL SULFATE) 2 puffs q6 hrs as needed  #1 x 11   Entered and Authorized by:   Corwin Levins MD   Signed by:   Corwin Levins MD on 12/03/2009   Method  used:   Print then Give to Patient   RxID:   [id number] EPIPEN 2-PAK 0.3 MG/0.3ML DEVI (EPINEPHRINE) use asd as needed  #1 x 1   Entered and Authorized by:   Corwin Levins MD   Signed by:   Corwin Levins MD on 12/03/2009   Method used:   Print then Give to Patient   RxID:   [id number]

## 2010-06-19 NOTE — Progress Notes (Signed)
Summary: Ua Ucx order  Phone Note Call from Patient Call back at Home Phone [phone]   Caller: Patient Summary of Call: Pt called stating that [facility] did not recieve order for UA and Ucx that was faxed yesterday but [facility] Clydie Ferrero will take verbal order at [phone]. Verbal given to Clydie Franklin via secure VM and order refaxed to [phone] Initial call taken by: Margaret Pyle, CMA,  April 09, 2010 1:01 PM

## 2010-06-19 NOTE — Assessment & Plan Note (Signed)
Summary: POST HOSP--DISCH'D 9/29--STC   Vital Signs:  Patient profile:   65 year old female Height:      70 inches Weight:      294 pounds BMI:     42.34 O2 Sat:      95 % on Room air Temp:     98.3 degrees F oral Pulse rate:   72 / minute BP sitting:   144 / 78  (left arm) Cuff size:   large  Vitals Entered By: Zella Ball Ewing CMA Duncan Dull) (February 14, 2010 11:39 AM)  O2 Flow:  Room air CC: Hospital followup/RE   Primary Care Provider:  [name], MD   CC:  Hospital followup/RE.  History of Present Illness: here post hosp d/c (just sept 29) with pna, acute resp failure with chronic med overdosing element;  AKD with acute diarrhea (on chronic) with volume depletion/hypotension, and acute confusion.  Mult meds held on d/c including the benicar, lasix,  aldactone, flexeril, topamax, and xanax reduced to half strength (to which she states she has done well without increased anxiety, panic or worsening depressive symtpoms or suicidal ideation).   Here with family with supports as has been usual in the past  (there was some misunderstanding at the time of her confusion recently but all "ironed out now"). Also percocet change to 1-2 q 6 hrs , to which she is taking 2 for pain control, so is actually getting more APAP than before, which she does not want to do.  Overall doing well since d/c without incr fever, HA, ST, prod or other cough; and Pt denies CP, worsening sob, doe, wheezing, orthopnea, pnd, worsening LE edema, palps, dizziness or syncope  We dont have accurate wts but pt thinks she is up 17 lbs since hosp admit (most of which likely fluids during the re-hydration).  echart review with pt present show blood and stool cx neg including c-diff, hemocult neg.  TSH and B12 normal.  Sept 29 BUN 13, cr 1.26; BNP 550, hgb 8.7, normal WBC and plt, a1c 6.3, ldl 103, mg normal.  Due for flu shot today.  Fininshing augmentin as per instructions fo 5 days. No other c/o's - Pt denies polydipsia,  polyuria, or low sugar symptoms such as shakiness improved with eating.  Overall good compliance with meds, trying to follow low chol, DM diet, wt stable, little excercise however  Pt denies new neuro symptoms such as headache, facial or extremity weakness  Confusion resolved. Was rec'd to be considered for referral for sleep apnea eval on f/u today. 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  () 23)  Tubal Ligation, Hx of  (ICD-V26.51) 24)  Deviated Septum,surgery  (ICD-470) 25)  Foot Surgery Back in 1980 and 1981  () 26)  Neuropathy, Hx of  (ICD-V12.40) 27)  Unspecified Hepatitis  (ICD-573.3) 28)  Arthritis  (ICD-716.90) 29)  CHF  (ICD-428.0) 30)  Sinusitis- Acute-nos  (ICD-461.9) 31)  Uri  (ICD-465.9) 32)  Acute Gouty Arthropathy  (ICD-274.01) 33)  Cellulitis, Left Leg  (ICD-682.6) 34)  Cellulitis, Leg, Left  (ICD-682.6) 35)  Abdominal Distension  (ICD-787.3) 36)  Weight Gain  (ICD-783.1) 37)  Peripheral Edema  (ICD-782.3) 38)  Foot Pain  (ICD-729.5) 39)  B12 Deficiency  (ICD-266.2) 40)  Preventive Health Care  (ICD-V70.0) 41)  Colonic Polyps, Hx of   (ICD-V12.72) 42)  Anxiety  (ICD-300.00) 43)  Gerd  (ICD-530.81) 44)  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 1 Patch Q 3 Days 2)  Benicar 40 Mg Tabs (Olmesartan Medoxomil) .... 1/2  Tab Once Daily 3)  Cymbalta 60 Mg Cpep (Duloxetine Hcl) .... 2 Cap Qd 4)  Pantoprazole Sodium 40 Mg Tbec (Pantoprazole Sodium) .[name].. 1 By Mouth Once Daily 5)  Furosemide 80 Mg Tabs (Furosemide) .[name].. 1 By Mouth Two Times A Day 6)  Gabapentin 300 Mg Caps (Gabapentin) .... 2 By Mouth in The Am, 1 By Mouth in The Afternoon, Then 2 By Mouth in The Pm 7)  Oxycodone-Acetaminophen 10-325 Mg Tabs (Oxycodone-Acetaminophen) .[name].. 1 By Mouth Q 6 Hours As Needed 8)  Tylenol Extra Strength 500 Mg Tabs (Acetaminophen) .[name].. 1 Tab By Mouth Every 4 Hours As Needed 9)  Alprazolam 0.5 Mg Tabs (Alprazolam) .[name].. 1 Tab By Mouth Three Times A Day As Needed 10)  Anti-Diarrheal 2 Mg Caps (Loperamide Hcl) .... Take 2 Ytabs By Mouth Prn 11)  Cyclobenzaprine Hcl 5 Mg Tabs (Cyclobenzaprine Hcl) .[name].. 1 Tab By Mouth Every 8 Hours As Needed 12)  Epipen 2-Pak 0.3 Mg/0.70ml (1:1000) Devi (Epinephrine Hcl (Anaphylaxis)) .... Use Asd 13)  Nystatin 100000 Unit/gm Powd (Nystatin) .... Use Asd To Affected Area Two Times A Day As Needed 14)  Topamax 100 Mg Tabs (Topiramate) .... 3 By Mouth Once Daily 15)  Flexeril 5 Mg Tabs (Cyclobenzaprine Hcl) .[name].. 1po Three Times A Day As Needed 16)  Promethazine Hcl 25 Mg Tabs (Promethazine Hcl) .[name].. 1 By Mouth Q 6 Hrs As Needed Nausea 17)  Cortisporin-Tc 3.07-18-08-0.5 Mg/ml Susp (Neomycin-Colist-Hc-Thonzonium) .... 2 Gtts Qid For 10 Days To Right Ear 18)  Vitamin C 500 Mg Tabs (Ascorbic Acid) .[name].. 1 By Mouth Once Daily 19)  Centrum Silver Ultra Womens  Tabs (Multiple Vitamins-Minerals) .[name].. 1 By Mouth Once  Daily 20)  Glucosamine 500 Mg Caps (Glucosamine Sulfate) .[name].. 1 By Mouth Once Daily 21)  Colchicine 0.6 Mg Tabs (Colchicine) .[name].. 1 By Mouth Two Times A Day 22)  Lidoderm 5 % Ptch (Lidocaine) .... As Needed 23)  Vitamin D 400 Unit Caps (Cholecalciferol) .[name].. 1 Cap Daily 24)  Aldactone 50 Mg Tabs (Spironolactone) .[name].. 1 By Mouth Once Daily 25)  Prosthetic Sleeve Socket .... Use Asd 26)  Lomotil 2.5-0.025 Mg Tabs (Diphenoxylate-Atropine) .[name].. 1 By Mouth Two Times A Day As Needed 27)  Fish Oil 1000 Mg Caps (Omega-3 Fatty Acids) .[name].. 1 Capsule By Mouth Once Daily 28)  Metamucil 30.9 % Powd (  Psyllium) .... Take As Directed 29)  Epipen 2-Pak 0.3 Mg/0.60ml Devi (Epinephrine) .... Use Asd As Needed 30)  Proair Hfa 108 (90 Base) Mcg/act Aers (Albuterol Sulfate) .... 2 Puffs Q6 Hrs As Needed 31)  Cephalexin 500 Mg Caps (Cephalexin) .[name].. 1 By Mouth Three Times A Day X 10d  Current Medications (verified): 1)  Fentanyl 75 Mcg/hr Pt72 (Fentanyl) .[name].. 1 Patch Q 3 Days 2)  Benicar 40 Mg Tabs (Olmesartan Medoxomil) .... 1/2  Tab Once Daily 3)  Cymbalta 60 Mg Cpep (Duloxetine Hcl) .... 2 Cap Qd 4)  Pantoprazole Sodium 40 Mg Tbec (Pantoprazole Sodium) .[name].. 1 By Mouth Once Daily 5)  Furosemide 80 Mg Tabs (Furosemide) .[name].. 1 By Mouth Two Times A Day 6)  Gabapentin 300 Mg Caps (Gabapentin) .... 2 By Mouth in The Am, 1 By Mouth in The Afternoon, Then 2 By Mouth in The Pm 7)  Oxycodone-Acetaminophen 10-325 Mg Tabs (Oxycodone-Acetaminophen) .[name].. 1 By Mouth Q 6 Hours As Needed 8)  Tylenol Extra Strength 500 Mg Tabs (Acetaminophen) .[name].. 1 Tab By Mouth Every 4 Hours As Needed 9)  Alprazolam 0.25 Mg Tabs (Alprazolam) .[name].. 1po Three Times A Day As Needed 10)  Anti-Diarrheal 2 Mg Caps (Loperamide Hcl) .... Take 2 Ytabs By Mouth Prn 11)  Cyclobenzaprine Hcl 5 Mg Tabs (Cyclobenzaprine Hcl) .[name].. 1 Tab By Mouth Every 8 Hours As Needed 12)  Epipen 2-Pak 0.3 Mg/0.21ml (1:1000) Devi (Epinephrine Hcl (Anaphylaxis)) .... Use  Asd 13)  Nystatin 100000 Unit/gm Powd (Nystatin) .... Use Asd To Affected Area Two Times A Day As Needed 14)  Topamax 100 Mg Tabs (Topiramate) .... 3 By Mouth Once Daily 15)  Flexeril 5 Mg Tabs (Cyclobenzaprine Hcl) .[name].. 1po Three Times A Day As Needed 16)  Promethazine Hcl 25 Mg Tabs (Promethazine Hcl) .[name].. 1 By Mouth Q 6 Hrs As Needed Nausea 17)  Cortisporin-Tc 3.07-18-08-0.5 Mg/ml Susp (Neomycin-Colist-Hc-Thonzonium) .... 2 Gtts Qid For 10 Days To Right Ear 18)  Vitamin C 500 Mg Tabs (Ascorbic Acid) .[name].. 1 By Mouth Once Daily 19)  Centrum Silver Ultra Womens  Tabs (Multiple Vitamins-Minerals) .[name].. 1 By Mouth Once Daily 20)  Glucosamine 500 Mg Caps (Glucosamine Sulfate) .[name].. 1 By Mouth Once Daily 21)  Colchicine 0.6 Mg Tabs (Colchicine) .[name].. 1 By Mouth Two Times A Day 22)  Lidoderm 5 % Ptch (Lidocaine) .... As Needed 23)  Vitamin D 400 Unit Caps (Cholecalciferol) .[name].. 1 Cap Daily 24)  Aldactone 50 Mg Tabs (Spironolactone) .[name].. 1 By Mouth Once Daily 25)  Prosthetic Sleeve Socket .... Use Asd 26)  Lomotil 2.5-0.025 Mg Tabs (Diphenoxylate-Atropine) .[name].. 1 By Mouth Two Times A Day As Needed 27)  Fish Oil 1000 Mg Caps (Omega-3 Fatty Acids) .[name].. 1 Capsule By Mouth Once Daily 28)  Metamucil 30.9 % Powd (Psyllium) .... Take As Directed 29)  Epipen 2-Pak 0.3 Mg/0.40ml Devi (Epinephrine) .... Use Asd As Needed 30)  Proair Hfa 108 (90 Base) Mcg/act Aers (Albuterol Sulfate) .... 2 Puffs Q6 Hrs As Needed 31)  Cbc and Bmet For Monday Oct 3 .... 585.2  Allergies (verified): 1)  ! * All Anti-Inflammatories 2)  ! * All Steroids 3)  ! Sulfa 4)  ! Erythromycin 5)  ! Cipro 6)  ! Morphine 7)  ! * Depomedrol  Past History:  Family History: Last updated: 05-25-2009 Father: died with colon cancer age 84 Mother:died with breast cancer age 84, as well other in extended family. Family History of Diabetes, stoke (Grandparents) Sister: breast cancer  Social History: Last updated:  12/03/2009 Divorced Retired - emergency Stage manager Divorced -1 son Tobacco Use - Former.  Alcohol Use - no Drug use-no  Risk Factors: Smoking Status: quit (01/28/2009)  Past Medical History: ARTHRITIS (ICD-716.90) CHF (ICD-428.0) GOUTY ARTHROPATHY (ICD-274.01) PERIPHERAL EDEMA (ICD-782.3) B12 DEFICIENCY (ICD-266.2) ANXIETY (ICD-300.00)  GERD  RHEUMATOID ARTHRITIS (ICD-714.0) ANEMIA-NOS (ICD-285.9) PERIPHERAL NEUROPATHY (ICD-356.9) HYPERTENSION (ICD-401.9) DIABETES MELLITUS, TYPE II (ICD-250.00) * 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 left ankle charcot joint CHRONIC DYSPHAGIA IBS Renal insufficiency trigeminal neuralgia hx MD Roster:   rehab - Dr Hermelinda Medicus                     optho -  Dr Tacy Dura                       GI - Dr Arlyce Dice                    derm - Dr Doylene Canning                    ENT - Dr Jenne Pane                    card - Dr Excell Seltzer chronic pain chronic venous insufficiency   Anemia-NOS  Past Surgical History: Reviewed history from 03/19/2009 and no changes required. 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 ACHALASIA TONSILLECTOMY, HX OF (ICD-V45.79) * S/P BREAST BIOPSY 1992 * S/P BKA 9/09 FOR DFU AND OSTEOMYELITIS TUBAL LIGATION, HX OF (ICD-V26.51) DEVIATED SEPTUM,SURGERY (ICD-470) * FOOT SURGERY BACK IN 1980 AND 1981  Review of Systems  The patient denies fever, weight gain, vision loss, decreased hearing, hoarseness, chest pain, syncope, dyspnea on exertion, prolonged cough, headaches, hemoptysis, abdominal pain, melena, hematochezia, severe indigestion/heartburn, hematuria, incontinence, suspicious skin lesions, transient blindness, depression, abnormal bleeding, enlarged lymph nodes, and angioedema.         all otherwise negative per pt -    Physical Exam  General:  alert and overweight-appearing.   Head:  normocephalic and  atraumatic.   Eyes:  vision grossly intact, pupils equal, and pupils round.   Ears:  R ear normal and L ear normal.   Nose:  no external deformity and no nasal discharge.   Mouth:  no gingival abnormalities and pharynx pink and moist.   Neck:  supple and no masses.   Lungs:  normal respiratory effort, R decreased breath sounds, and L decreased breath sounds.   Heart:  normal rate and regular rhythm.   Abdomen:  soft, non-tender, and normal bowel sounds.   Msk:  no acute  joint tenderness and no joint swelling.   Extremities:  LLE with prob chronic trace edema, s/p right ampuation Neurologic:  alert & oriented X3, cranial nerves II-XII intact, and strength normal in all extremities.but with general weakness,  Skin:  color normal and no rashes.   Psych:  but does not recall much of the recent hospnOriented X3, memory intact for recent and remote, and not depressed appearing.     Impression & Recommendations:  Problem # 1:  PNEUMONIA, ORGANISM UNSPECIFIED (ICD-486)  The following medications were removed from the medication list:    Cephalexin 500 Mg Caps (Cephalexin) .[name]... 1 by mouth three times a day x 10d Her updated medication list for this problem includes:    Augmentin  875-125 Mg Tabs (Amoxicillin-pot clavulanate) .[name]... 1po two times a day for 5 days stable overall by hx and exam, ok to continue meds/tx as is , afeb, last wbc normal and exam stable today  for insurance purpose:  over 50 min spent facetoface time with pt in history and exam  Problem # 2:  RENAL INSUFFICIENCY (ICD-588.9) for f/u bmet mon oct 3  Problem # 3:  ANEMIA-NOS (ICD-285.9) no overt bleeding, hemocynam stable - for f/u cbc mon oct 3  Problem # 4:  HYPERTENSION (ICD-401.9)  Her updated medication list for this problem includes:    Benicar 40 Mg Tabs (Olmesartan medoxomil) .[name]... 1/2  tab once daily    Furosemide 80 Mg Tabs (Furosemide) .[name]... 1 by mouth two times a day    Aldactone 50 Mg Tabs  (Spironolactone) .[name]... 1 by mouth once daily to hold meds until mon oct 3 , restart the benicar only if sbp > 140 per home nurse, and lasix for increased swelling;  pt plans also to get accurate wt with the wheelchair at the [facility] with floor scale  Problem # 5:  ANXIETY (ICD-300.00)  Her updated medication list for this problem includes:    Cymbalta 60 Mg Cpep (Duloxetine hcl) .[name]... 2 cap qd    Alprazolam 0.25 Mg Tabs (Alprazolam) .[name]... 1po three times a day as needed stable overall by hx and exam, ok to continue meds/tx as is   Problem # 6:  CHRONIC PAIN SYNDROME (ICD-338.4) to f/u with her pain management tomorrow, cont to hold meds as per d/c summary for now except ok to change back to the percocet 10/325 q 6 to reduce APAP per day  Problem # 7:  HYPERSOMNIA (ICD-780.54)  for pulm eval - will refer   Orders: Pulmonary Referral (Pulmonary)  Complete Medication List: 1)  Fentanyl 75 Mcg/hr Pt72 (Fentanyl) .[name].. 1 patch q 3 days 2)  Benicar 40 Mg Tabs (Olmesartan medoxomil) .... 1/2  tab once daily 3)  Cymbalta 60 Mg Cpep (Duloxetine hcl) .... 2 cap qd 4)  Pantoprazole Sodium 40 Mg Tbec (Pantoprazole sodium) .[name].. 1 by mouth once daily 5)  Furosemide 80 Mg Tabs (Furosemide) .[name].. 1 by mouth two times a day 6)  Gabapentin 300 Mg Caps (Gabapentin) .... 2 by mouth in the am, 1 by mouth in the afternoon, then 2 by mouth in the pm 7)  Oxycodone-acetaminophen 10-325 Mg Tabs (Oxycodone-acetaminophen) .[name].. 1 by mouth q 6 hours as needed 8)  Tylenol Extra Strength 500 Mg Tabs (Acetaminophen) .[name].. 1 tab by mouth every 4 hours as needed 9)  Alprazolam 0.25 Mg Tabs (Alprazolam) .[name].. 1po three times a day as needed 10)  Anti-diarrheal 2 Mg Caps (Loperamide hcl) .... Take 2 ytabs by mouth prn 11)  Cyclobenzaprine Hcl 5 Mg Tabs (Cyclobenzaprine hcl) .[name].. 1 tab by mouth every 8 hours as needed 12)  Epipen 2-pak 0.3 Mg/0.63ml (1:1000) Devi (Epinephrine hcl (anaphylaxis)) .... Use asd 13)  Nystatin  100000 Unit/gm Powd (Nystatin) .... Use asd to affected area two times a day as needed 14)  Topamax 100 Mg Tabs (Topiramate) .... 3 by mouth once daily 15)  Flexeril 5 Mg Tabs (Cyclobenzaprine hcl) .[name].. 1po three times a day as needed 16)  Promethazine Hcl 25 Mg Tabs (Promethazine hcl) .[name].. 1 by mouth q 6 hrs as needed nausea 17)  Cortisporin-tc 3.07-18-08-0.5 Mg/ml Susp (Neomycin-colist-hc-thonzonium) .... 2 gtts qid for 10 days to right ear 18)  Vitamin C 500 Mg Tabs (Ascorbic  acid) .... 1 by mouth once daily 19)  Centrum Silver Ultra Womens Tabs (Multiple vitamins-minerals) .[name].. 1 by mouth once daily 20)  Glucosamine 500 Mg Caps (Glucosamine sulfate) .[name].. 1 by mouth once daily 21)  Colchicine 0.6 Mg Tabs (Colchicine) .[name].. 1 by mouth two times a day 22)  Lidoderm 5 % Ptch (Lidocaine) .... As needed 23)  Vitamin D 400 Unit Caps (Cholecalciferol) .[name].. 1 cap daily 24)  Aldactone 50 Mg Tabs (Spironolactone) .[name].. 1 by mouth once daily 25)  Prosthetic Sleeve Socket  .... Use asd 26)  Lomotil 2.5-0.025 Mg Tabs (Diphenoxylate-atropine) .[name].. 1 by mouth two times a day as needed 27)  Fish Oil 1000 Mg Caps (Omega-3 fatty acids) .[name].. 1 capsule by mouth once daily 28)  Metamucil 30.9 % Powd (Psyllium) .... Take as directed 29)  Epipen 2-pak 0.3 Mg/0.77ml Devi (Epinephrine) .... Use asd as needed 30)  Proair Hfa 108 (90 Base) Mcg/act Aers (Albuterol sulfate) .... 2 puffs q6 hrs as needed 31)  Cbc and Bmet For Monday Oct 3  .... 585.2 32)  Augmentin 875-125 Mg Tabs (Amoxicillin-pot clavulanate) .[name].. 1po two times a day for 5 days  Other Orders: Flu Vaccine 30yrs + MEDICARE PATIENTS ([id number]) Administration Flu vaccine - MCR (U0454)  Patient Instructions: 1)  please re-start the furosemide at 80 mg qam next Mon 2)  OK to re-start the benicar if the BP is > 140 on Mon as well 3)  OK to change back to the oxycodone/apap 10/325 as you had before 4)  Continue the alprazolam at the .25 mg strength 5)  Please  ask for blood work for Monday Oct 3 6)  You had the flu shot today 7)  Please schedule a follow-up appointment in 1 month. Prescriptions: CBC AND BMET FOR MONDAY OCT 3 585.2  #0 x 0   Entered and Authorized by:   Corwin Levins MD   Signed by:   Corwin Levins MD on 02/14/2010   Method used:   Print then Give to Patient   RxID:   [id number]     Flu Vaccine Consent Questions     Do you have a history of severe allergic reactions to this vaccine? no    Any prior history of allergic reactions to egg and/or gelatin? no    Do you have a sensitivity to the preservative Thimersol? no    Do you have a past history of Guillan-Barre Syndrome? no    Do you currently have an acute febrile illness? no    Have you ever had a severe reaction to latex? no    Vaccine information given and explained to patient? yes    Are you currently pregnant? no    Lot Number:[id number]   Exp Date:11/15/2010   Site Given  Left Deltoid IMflu .[name]... Lamar Sprinkles, CMA  February 14, 2010 12:36 PM   Appended Document: POST HOSP--DISCH'D 9/29--STC addendum:  to be clear for billing purposes:  more than 50% of time was spent on counseling and coordinating of care for this visit

## 2010-06-19 NOTE — Miscellaneous (Signed)
Summary: Missed Visit/[facility]  Missed Visit/[facility]   Imported By: Sherian Rein 03/14/2010 14:46:02  _____________________________________________________________________  External Attachment:    Type:   Image     Comment:   External Document

## 2010-06-19 NOTE — Assessment & Plan Note (Signed)
Summary: Diarrhea--ch.   History of Present Illness Visit Type: Follow-up Visit Primary GI MD: Melvia Heaps MD [facility] Primary Provider: [name], MD  Requesting Provider: n/a Chief Complaint: diarrhea, unpredictabe for 6 months History of Present Illness:   [name] has returned for evaluation of diarrhea.  Periodically she has explosive diarrhea on a  very irregular basis.  Approximately a month ago she received antibiotics for a sinus infection.  Since that time she has had diarrhea almost daily.  It is accompanied  by urgency.  There is no bleeding or passage of mucus.  Colonoscopy in January, 2011 was normal.   GI Review of Systems    Reports loss of appetite.      Denies abdominal pain, acid reflux, belching, bloating, chest pain, dysphagia with liquids, dysphagia with solids, heartburn, nausea, vomiting, vomiting blood, weight loss, and  weight gain.      Reports diarrhea and  fecal incontinence.     Denies anal fissure, black tarry stools, change in bowel habit, constipation, diverticulosis, heme positive stool, hemorrhoids, irritable bowel syndrome, jaundice, light color stool, liver problems, rectal bleeding, and  rectal pain.    Current Medications (verified): 1)  Fentanyl 75 Mcg/hr Pt72 (Fentanyl) .[name].. 1 Patch Q 3 Days 2)  Benicar 40 Mg Tabs (Olmesartan Medoxomil) .... 1/2  Tab Once Daily 3)  Cymbalta 60 Mg Cpep (Duloxetine Hcl) .... 2 Cap Qd 4)  Pantoprazole Sodium 40 Mg Tbec (Pantoprazole Sodium) .[name].. 1 By Mouth Once Daily 5)  Furosemide 80 Mg Tabs (Furosemide) .[name].. 1 By Mouth Two Times A Day 6)  Gabapentin 300 Mg Caps (Gabapentin) .... 2 By Mouth in The Am, 1 By Mouth in The Afternoon, Then 2 By Mouth in The Pm 7)  Oxycodone-Acetaminophen 10-325 Mg Tabs (Oxycodone-Acetaminophen) .[name].. 1 By Mouth Q 6 Hours As Needed 8)  Tylenol Extra Strength 500 Mg Tabs (Acetaminophen) .[name].. 1 Tab By Mouth Every 4 Hours As Needed 9)  Alprazolam 0.5 Mg Tabs (Alprazolam) .[name].. 1 Tab By Mouth  Three Times A Day As Needed 10)  Anti-Diarrheal 2 Mg Caps (Loperamide Hcl) .... Take 2 Ytabs By Mouth Prn 11)  Cyclobenzaprine Hcl 5 Mg Tabs (Cyclobenzaprine Hcl) .[name].. 1 Tab By Mouth Every 8 Hours As Needed 12)  Epipen 2-Pak 0.3 Mg/0.53ml (1:1000) Devi (Epinephrine Hcl (Anaphylaxis)) .... Use Asd 13)  Nystatin 100000 Unit/gm Powd (Nystatin) .... Use Asd To Affected Area Two Times A Day As Needed 14)  Topamax 100 Mg Tabs (Topiramate) .... 3 By Mouth Once Daily 15)  Flexeril 5 Mg Tabs (Cyclobenzaprine Hcl) .[name].. 1po Three Times A Day As Needed 16)  Promethazine Hcl 25 Mg Tabs (Promethazine Hcl) .[name].. 1 By Mouth Q 6 Hrs As Needed Nausea 17)  Cortisporin-Tc 3.07-18-08-0.5 Mg/ml Susp (Neomycin-Colist-Hc-Thonzonium) .... 2 Gtts Qid For 10 Days To Right Ear 18)  Vitamin C 500 Mg Tabs (Ascorbic Acid) .[name].. 1 By Mouth Once Daily 19)  Centrum Silver Ultra Womens  Tabs (Multiple Vitamins-Minerals) .[name].. 1 By Mouth Once Daily 20)  Glucosamine 500 Mg Caps (Glucosamine Sulfate) .[name].. 1 By Mouth Once Daily 21)  Colchicine 0.6 Mg Tabs (Colchicine) .[name].. 1 By Mouth Two Times A Day 22)  Lidoderm 5 % Ptch (Lidocaine) .... As Needed 23)  Vitamin D 400 Unit Caps (Cholecalciferol) .[name].. 1 Cap Daily 24)  Aldactone 50 Mg Tabs (Spironolactone) .[name].. 1 By Mouth Once Daily 25)  Prosthetic Sleeve Socket .... Use Asd 26)  Lomotil 2.5-0.025 Mg Tabs (Diphenoxylate-Atropine) .[name].. 1 By Mouth Two Times  A Day As Needed 27)  Azithromycin 250 Mg Tabs (Azithromycin) .... 2po Qd For 1 Day, Then 1po Qd For 4days, Then Stop  Allergies (verified): 1)  ! * All Anti-Inflammatories 2)  ! * All Steroids 3)  ! Sulfa 4)  ! Erythromycin 5)  ! Cipro 6)  ! Morphine  Past History:  Past Medical History: Reviewed history from 06/04/2009 and no changes required. 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 left ankle charcot joint CHRONIC DYSPHAGIA IBS  Past Surgical History: Reviewed history from 03/19/2009 and no changes required. 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 Father: died with colon cancer age 42 Mother:died with breast cancer age 49, as well other in extended family. Family History of Diabetes, stoke (Grandparents) Sister: breast cancer   Social History: Reviewed history from 01/28/2009 and no changes required. Divorced Retired - Marketing executive Divorced -1 son Tobacco Use - Former.  Alcohol Use - no  Review of Systems       The patient complains of allergy/sinus, arthritis/joint pain, back pain, cough, fever, muscle pains/cramps, sleeping problems, and swelling of feet/legs.  The patient denies anemia, anxiety-new, blood in urine, breast changes/lumps, change in vision, confusion, coughing up blood, depression-new, fainting, fatigue, headaches-new, hearing problems, heart murmur, heart rhythm changes, itching, menstrual pain, night  sweats, nosebleeds, pregnancy symptoms, shortness of breath, skin rash, sore throat, swollen lymph glands, thirst - excessive , urination - excessive , urination changes/pain, urine leakage, vision changes, and voice change.    Vital Signs:  Patient profile:   65 year old female Height:      70 inches Pulse rate:   68 / minute Pulse rhythm:   regular BP sitting:   122 / 68  (left arm) Cuff size:   large  Vitals Entered By: June McMurray CMA Duncan Dull) (July 09, 2009 9:34 AM)   Impression & Recommendations:  Problem # 1:  DIARRHEA (ICD-787.91) Her current episode of diarrhea are likely antibiotic-related, be it pseudomembranous  colitis or non-pseudomembranousr  colitis.  More chronic, intermittent diarrhea is more likely due to IBS or mild maldigestion.  Recommendations #1 check stool for C. difficile toxin #2 florastor 2 tabs b.i.d. for 14 days #3 fiber supplementation once her current problems with diarrhea have resolved Orders: T-Culture, C-Diff Toxin A/B ([id number])  Weight was not documented today as patient unable to stand; BMI could not be calculated.  Patient Instructions: 1)  You will start Florastor over the counter 2 by mouth two times a day for 14 days 2)  You will go to the basement today for labs 3)  The medication list was reviewed and reconciled.  All changed / newly prescribed medications were explained.  A complete medication list was provided to the patient / caregiver.

## 2010-06-19 NOTE — Progress Notes (Signed)
----   Converted from flag ---- ---- 02/16/2010 11:48 AM, Corwin Levins MD wrote: Zella Ball - please inform pt that further review of her d/c information from the hosp indicated she should be eval'd for sleep apnea - I have referred to pulmonary for this, and she should be called soon ------------------------------  called left msg. to call back Called pt informed of above information. Patient informed that the nurse came in on Saturday and her oxygen was at 94%. She does have diarrhea now she thinks due to the antibiotics. Overall doing and feeling much better and wanted to thank JWJ for helping.  .noted Corwin Levins MD  February 17, 2010 1:06 PM

## 2010-06-19 NOTE — Progress Notes (Signed)
Summary: Prep meds  Phone Note Call from Patient Call back at Home Phone [phone]   Call For: Dr Arlyce Dice Summary of Call: [facility] says they did not receive the prep meds for her procedure on Fri 05-24-09 Initial call taken by: Leanor Kail [facility],  May 22, 2009 2:13 PM  Follow-up for Phone Call        [facility] pharmacy they stated they had the MoviPrep ready for her. Called pt to inform ,L/M for pt Follow-up by: Merri Ray CMA Duncan Dull),  May 22, 2009 2:29 PM

## 2010-06-19 NOTE — Miscellaneous (Signed)
Summary: Orders Update  Clinical Lists Changes  Problems: Added new problem of DYSURIA (ICD-788.1) Orders: Added new Test order of TLB-Udip w/ Micro ([id number]) - Signed Added new Test order of T-Culture, Urine ([id number]) - Signed  Appended Document: Orders Update Patient called requesting that her urine be checked as she thinks she may have a UTI. Patient had surgery this morning on her nose in [place] and did not want to stay for appt. this afternoon. Patient requested her urine be checked and she agreed to call blue card for results.

## 2010-06-19 NOTE — Letter (Signed)
Summary: [facility]   Imported By: Sherian Rein 06/17/2009 10:46:39  _____________________________________________________________________  External Attachment:    Type:   Image     Comment:   External Document

## 2010-06-19 NOTE — Progress Notes (Signed)
Summary: pt ?  Phone Note Call from Patient Call back at Home Phone [phone]   Caller: Patient Summary of Call: pt called to inform MD that she has had to cancel appt with MD tomorrow due to inclement weather forcast. pt states that she has an ear infection again and a cold and would like to know the best OTC decongestant to use until she can re-schedule appt with MD Initial call taken by: Margaret Pyle, CMA,  May 27, 2009 10:28 AM  Follow-up for Phone Call        actifed is good, or mucinex D Follow-up by: Corwin Levins MD,  May 27, 2009 1:59 PM  Additional Follow-up for Phone Call Additional follow up Details #1::        Pt notified Additional Follow-up by: Orlan Leavens,  May 27, 2009 2:29 PM

## 2010-06-19 NOTE — Progress Notes (Signed)
Summary: FYI - HOME HEALTH  Phone Note From Other Clinic   Caller: Joen Laura Summary of Call: Lorain Childes - Patient is now set up with [name], home health aid and PT services. THey will also do labs as requested.  Initial call taken by: Lamar Sprinkles, CMA,  February 17, 2010 8:53 AM  Follow-up for Phone Call        noted Follow-up by: Corwin Levins MD,  February 17, 2010 9:52 AM

## 2010-06-19 NOTE — Procedures (Signed)
Summary: Colonoscopy  Patient: Crystal Fitzpatrick Note: All result statuses are Final unless otherwise noted.  Tests: (1) Colonoscopy (COL)   COL Colonoscopy           DONE     [facility]     [address] [place], [place]  [address]           COLONOSCOPY PROCEDURE REPORT           PATIENT:  Crystal Fitzpatrick, Crystal Fitzpatrick  MR#:  [id number]     BIRTHDATE:  [birth year], 63 yrs. old  GENDER:  female           ENDOSCOPIST:  Barbette Hair. Arlyce Dice, MD     Referred by:           PROCEDURE DATE:  05/24/2009     PROCEDURE:  Colonoscopy, Diagnostic     ASA CLASS:  Class II     INDICATIONS:  history of pre-cancerous (adenomatous) colon polyps                 MEDICATIONS:   Fentanyl 85 mcg IV, Versed 9 mg IV           DESCRIPTION OF PROCEDURE:   After the risks benefits and     alternatives of the procedure were thoroughly explained, informed     consent was obtained.  Digital rectal exam was performed and     revealed no abnormalities.   The [id number] ([id number]) endoscope was     introduced through the anus and advanced to the cecum, which was     identified by the ileocecal valve, without limitations.  The     quality of the prep was good, using MoviPrep.  The instrument was     then slowly withdrawn as the colon was fully examined.     <<PROCEDUREIMAGES>>           FINDINGS:  A normal appearing cecum, ileocecal valve, and     appendiceal orifice were identified. The ascending, hepatic     flexure, transverse, splenic flexure, descending, sigmoid colon,     and rectum appeared unremarkable (see image003, image002,     image005, image006, image008, image011, image012, and image013).     Retroflexed views in the rectum revealed no abnormalities.    The     scope was then withdrawn from the patient and the procedure     completed.           COMPLICATIONS:  None           ENDOSCOPIC IMPRESSION:     1) Normal colon     RECOMMENDATIONS:     1) colonoscopy     10 years           REPEAT EXAM:   In 10 year(s) for Colonoscopy.           ______________________________     Barbette Hair. Arlyce Dice, MD           CC:  Corwin Levins, MD           n.     Rosalie Doctor:   Barbette Hair. Kaplan at 05/24/2009 01:39 PM           [name], [phone]  Note: An exclamation mark (!) indicates a result that was not dispersed into the flowsheet. Document Creation Date: 05/24/2009 1:39 PM _______________________________________________________________________  (1) Order result status: Final Collection or observation date-time: 05/24/2009 13:33 Requested date-time:  Receipt date-time:  Reported date-time:  Referring Physician:   Ordering Physician: Melvia Heaps [phone]) Specimen Source:  Source: Launa Grill Order Number: [id number] Lab site:   Appended Document: Colonoscopy Recall is in IDX for 05/2019.

## 2010-06-19 NOTE — Progress Notes (Signed)
Summary: Referral  Phone Note Call from Patient Call back at Home Phone [phone]   Caller: Patient Summary of Call: pt called to request referral to a new Derm MD. Pt was seeing Dr. Michaell Cowing who is no longer practicing in [place]. Initial call taken by: Margaret Pyle, CMA,  Sep 30, 2009 1:30 PM  Follow-up for Phone Call        ok for referral  -  wil do Follow-up by: Corwin Levins MD,  Sep 30, 2009 5:37 PM  Additional Follow-up for Phone Call Additional follow up Details #1::        pt informed via VM Additional Follow-up by: Margaret Pyle, CMA,  Oct 01, 2009 8:25 AM

## 2010-06-19 NOTE — Procedures (Signed)
Summary: Order for Oximetry/IDS  Order for Oximetry/IDS   Imported By: Sherian Rein 04/04/2010 10:56:15  _____________________________________________________________________  External Attachment:    Type:   Image     Comment:   External Document

## 2010-06-19 NOTE — Miscellaneous (Signed)
Summary: Plan/[facility]  Plan/[facility]   Imported By: Lester [place] 02/28/2010 09:17:20  _____________________________________________________________________  External Attachment:    Type:   Image     Comment:   External Document

## 2010-06-19 NOTE — Progress Notes (Signed)
Summary: Rx req  Phone Note Call from Patient Call back at Home Phone [phone]   Caller: Patient Call For: Corwin Levins MD Summary of Call: Pt states md suppose to be setting her up to have over night oxygen. Need order fax to [phone] Initial call taken by: Orlan Leavens RMA,  April 22, 2010 12:35 PM  Follow-up for Phone Call        I dont see evidence on the EMR for her ONO results  please call [facility] for results Follow-up by: Corwin Levins MD,  April 22, 2010 12:41 PM  Additional Follow-up for Phone Call Additional follow up Details #1::        Per Clydie Renzulli at [facility] office, hardcopy Rx from PCP needs to be faxed directly to company that provides pt's O2, paperwork was filled out but did not have Rx (Rx was faxed to [facility] per their req) Additional Follow-up by: Margaret Pyle, CMA,  April 22, 2010 2:10 PM    Additional Follow-up for Phone Call Additional follow up Details #2::    done hardcopy to [facility] side B - dahlia  Follow-up by: Corwin Levins MD,  April 22, 2010 5:36 PM  Additional Follow-up for Phone Call Additional follow up Details #3:: Details for Additional Follow-up Action Taken: Rx faxed per request Additional Follow-up by: Margaret Pyle, CMA,  April 23, 2010 8:06 AM  New/Updated Medications: * OXYGEN 2L AT NIGHT use asd per [place] Prescriptions: OXYGEN 2L AT NIGHT use asd per   #2L x 99   Entered and Authorized by:   Corwin Levins MD   Signed by:   Corwin Levins MD on 04/22/2010   Method used:   Print then Give to Patient   RxID:   [id number]

## 2010-06-19 NOTE — Letter (Signed)
Summary: CMN- Oxygen  / [facility]  CMN- Oxygen  / [facility]   Imported By: Lennie Odor 06/12/2010 14:15:50  _____________________________________________________________________  External Attachment:    Type:   Image     Comment:   External Document

## 2010-06-19 NOTE — Miscellaneous (Signed)
Summary: Care Plan/[facility]  Care Plan/[facility]   Imported By: Sherian Rein 04/30/2010 09:13:31  _____________________________________________________________________  External Attachment:    Type:   Image     Comment:   External Document

## 2010-06-19 NOTE — Progress Notes (Signed)
Summary: Order  Phone Note Other Incoming   Caller: Carmell Austria [phone] Summary of Call: [facility] called requesting an order for overnight oximetry on pt Initial call taken by: Margaret Pyle, CMA,  March 27, 2010 3:55 PM  Follow-up for Phone Call        already done on the form I specifically just filled out from [facility] regarding the ONO and blood draws for the next few months - do I really need to do it again? Follow-up by: Corwin Levins MD,  March 27, 2010 4:44 PM  Additional Follow-up for Phone Call Additional follow up Details #1::        Her HHRN a hand written order on Rx pad needs to go to company suppling pt's O2, [facility] [phone].  Additional Follow-up by: Alysia Penna,  March 28, 2010 10:30 AM    Additional Follow-up for Phone Call Additional follow up Details #2::    done hardcopy to [facility] side B - dahlia  Follow-up by: Corwin Levins MD,  March 28, 2010 11:33 AM  Additional Follow-up for Phone Call Additional follow up Details #3:: Details for Additional Follow-up Action Taken: Rx faxed to [phone] Additional Follow-up by: Alysia Penna,  March 28, 2010 12:33 PM

## 2010-06-25 NOTE — Progress Notes (Signed)
Summary: medication refill  Phone Note Refill Request Message from:  Fax from Pharmacy on June 18, 2010 3:40 PM  Refills Requested: Medication #1:  ALPRAZOLAM 0.25 MG TABS 1po three times a day as needed   Dosage confirmed as above?Dosage Confirmed   Notes: [facility] [phone] Initial call taken by: Robin Ewing CMA (AAMA),  June 18, 2010 3:41 PM    Prescriptions: ALPRAZOLAM 0.25 MG TABS (ALPRAZOLAM) 1po three times a day as needed  #90 x 2   Entered and Authorized by:   Corwin Levins MD   Signed by:   Corwin Levins MD on 06/18/2010   Method used:   Print then Give to Patient   RxID:   [id number]  done hardcopy to [facility] side B - dahlia Corwin Levins MD  June 18, 2010 4:23 PM   Rx faxed to pharmacy Margaret Pyle, CMA  June 18, 2010 4:33 PM

## 2010-07-03 ENCOUNTER — Ambulatory Visit: Payer: Self-pay

## 2010-07-04 DIAGNOSIS — F411 Generalized anxiety disorder: Secondary | ICD-10-CM

## 2010-07-04 DIAGNOSIS — F329 Major depressive disorder, single episode, unspecified: Secondary | ICD-10-CM

## 2010-07-10 ENCOUNTER — Encounter: Payer: Medicare Other | Attending: Physical Medicine & Rehabilitation

## 2010-07-10 ENCOUNTER — Encounter: Payer: Self-pay | Admitting: Internal Medicine

## 2010-07-10 ENCOUNTER — Ambulatory Visit: Payer: Medicare Other

## 2010-07-10 DIAGNOSIS — G609 Hereditary and idiopathic neuropathy, unspecified: Secondary | ICD-10-CM

## 2010-07-10 DIAGNOSIS — M069 Rheumatoid arthritis, unspecified: Secondary | ICD-10-CM | POA: Insufficient documentation

## 2010-07-10 DIAGNOSIS — E669 Obesity, unspecified: Secondary | ICD-10-CM | POA: Insufficient documentation

## 2010-07-10 DIAGNOSIS — M79609 Pain in unspecified limb: Secondary | ICD-10-CM | POA: Insufficient documentation

## 2010-07-10 DIAGNOSIS — G547 Phantom limb syndrome without pain: Secondary | ICD-10-CM | POA: Insufficient documentation

## 2010-07-10 DIAGNOSIS — M869 Osteomyelitis, unspecified: Secondary | ICD-10-CM

## 2010-07-10 DIAGNOSIS — F341 Dysthymic disorder: Secondary | ICD-10-CM

## 2010-07-10 DIAGNOSIS — M1710 Unilateral primary osteoarthritis, unspecified knee: Secondary | ICD-10-CM | POA: Insufficient documentation

## 2010-07-17 DIAGNOSIS — F4542 Pain disorder with related psychological factors: Secondary | ICD-10-CM

## 2010-07-17 DIAGNOSIS — F411 Generalized anxiety disorder: Secondary | ICD-10-CM

## 2010-07-24 NOTE — Letter (Signed)
Summary: CMN for Oxygen/[facility]  CMN for Oxygen/[facility]   Imported By: Sherian Rein 07/15/2010 10:20:45  _____________________________________________________________________  External Attachment:    Type:   Image     Comment:   External Document

## 2010-07-31 LAB — URINALYSIS, ROUTINE W REFLEX MICROSCOPIC
Bilirubin Urine: NEGATIVE
Glucose, UA: NEGATIVE mg/dL
Hgb urine dipstick: NEGATIVE
Ketones, ur: NEGATIVE mg/dL
Specific Gravity, Urine: 1.021 (ref 1.005–1.030)
Urobilinogen, UA: 0.2 mg/dL (ref 0.0–1.0)
pH: 5 (ref 5.0–8.0)
pH: 5.5 (ref 5.0–8.0)

## 2010-07-31 LAB — BASIC METABOLIC PANEL
BUN: 13 mg/dL (ref 6–23)
BUN: 22 mg/dL (ref 6–23)
CO2: 23 mEq/L (ref 19–32)
CO2: 25 mEq/L (ref 19–32)
CO2: 26 mEq/L (ref 19–32)
CO2: 26 mEq/L (ref 19–32)
Calcium: 8.1 mg/dL — ABNORMAL LOW (ref 8.4–10.5)
Calcium: 8.1 mg/dL — ABNORMAL LOW (ref 8.4–10.5)
Calcium: 8.6 mg/dL (ref 8.4–10.5)
Chloride: 111 mEq/L (ref 96–112)
Chloride: 112 mEq/L (ref 96–112)
Creatinine, Ser: 1.26 mg/dL — ABNORMAL HIGH (ref 0.4–1.2)
GFR calc Af Amer: 12 mL/min — ABNORMAL LOW (ref >60)
GFR calc Af Amer: 24 mL/min — ABNORMAL LOW (ref >60)
GFR calc Af Amer: 46 mL/min — ABNORMAL LOW (ref >60)
GFR calc Af Amer: 48 mL/min — ABNORMAL LOW (ref >60)
GFR calc non Af Amer: 10 mL/min — ABNORMAL LOW (ref >60)
GFR calc non Af Amer: 20 mL/min — ABNORMAL LOW (ref >60)
Glucose, Bld: 117 mg/dL — ABNORMAL HIGH (ref 70–99)
Glucose, Bld: 160 mg/dL — ABNORMAL HIGH (ref 70–99)
Glucose, Bld: 99 mg/dL (ref 70–99)
Potassium: 4 mEq/L (ref 3.5–5.1)
Potassium: 4.2 mEq/L (ref 3.5–5.1)
Potassium: 4.4 mEq/L (ref 3.5–5.1)
Potassium: 5.1 mEq/L (ref 3.5–5.1)
Sodium: 136 mEq/L (ref 135–145)
Sodium: 140 mEq/L (ref 135–145)
Sodium: 141 mEq/L (ref 135–145)
Sodium: 141 mEq/L (ref 135–145)
Sodium: 142 mEq/L (ref 135–145)

## 2010-07-31 LAB — BRAIN NATRIURETIC PEPTIDE
Pro B Natriuretic peptide (BNP): 168 pg/mL — ABNORMAL HIGH (ref 0.0–100.0)
Pro B Natriuretic peptide (BNP): 249 pg/mL — ABNORMAL HIGH (ref 0.0–100.0)

## 2010-07-31 LAB — COMPREHENSIVE METABOLIC PANEL
ALT: 10 U/L (ref 0–35)
AST: 7 U/L (ref 0–37)
AST: 8 U/L (ref 0–37)
Albumin: 3.2 g/dL — ABNORMAL LOW (ref 3.5–5.2)
Alkaline Phosphatase: 72 U/L (ref 39–117)
CO2: 24 mEq/L (ref 19–32)
Calcium: 8.2 mg/dL — ABNORMAL LOW (ref 8.4–10.5)
Calcium: 8.3 mg/dL — ABNORMAL LOW (ref 8.4–10.5)
Creatinine, Ser: 2.73 mg/dL — ABNORMAL HIGH (ref 0.4–1.2)
GFR calc Af Amer: 13 mL/min — ABNORMAL LOW (ref >60)
GFR calc Af Amer: 21 mL/min — ABNORMAL LOW (ref >60)
GFR calc non Af Amer: 17 mL/min — ABNORMAL LOW (ref >60)
Glucose, Bld: 105 mg/dL — ABNORMAL HIGH (ref 70–99)
Potassium: 4.9 mEq/L (ref 3.5–5.1)
Sodium: 137 mEq/L (ref 135–145)
Total Protein: 6.9 g/dL (ref 6.0–8.3)

## 2010-07-31 LAB — CULTURE, BAL-QUANTITATIVE W GRAM STAIN: Colony Count: NO GROWTH

## 2010-07-31 LAB — ABO/RH: ABO/RH(D): O POS

## 2010-07-31 LAB — URINE MICROSCOPIC-ADD ON

## 2010-07-31 LAB — BLOOD GAS, ARTERIAL
Acid-base deficit: 2.8 mmol/L — ABNORMAL HIGH (ref 0.0–2.0)
Acid-base deficit: 3.3 mmol/L — ABNORMAL HIGH (ref 0.0–2.0)
Bicarbonate: 22.8 mEq/L (ref 20.0–24.0)
Bicarbonate: 23 mEq/L (ref 20.0–24.0)
Drawn by: 308601
FIO2: 0.6 %
O2 Content: 3 L/min
O2 Content: 3 L/min
O2 Saturation: 88.4 %
O2 Saturation: 98.5 %
PEEP: 5 cmH2O
Patient temperature: 98.6
Patient temperature: 98.6
Patient temperature: 98.6
Patient temperature: 98.6
RATE: 14 resp/min
TCO2: 22.7 mmol/L (ref 0–100)
pCO2 arterial: 55.9 mmHg — ABNORMAL HIGH (ref 35.0–45.0)
pH, Arterial: 7.212 — ABNORMAL LOW (ref 7.350–7.400)
pH, Arterial: 7.238 — ABNORMAL LOW (ref 7.350–7.400)
pH, Arterial: 7.257 — ABNORMAL LOW (ref 7.350–7.400)

## 2010-07-31 LAB — VITAMIN B12: Vitamin B-12: 328 pg/mL (ref 211–911)

## 2010-07-31 LAB — CBC
HCT: 24.3 % — ABNORMAL LOW (ref 36.0–46.0)
HCT: 25.2 % — ABNORMAL LOW (ref 36.0–46.0)
HCT: 25.5 % — ABNORMAL LOW (ref 36.0–46.0)
HCT: 25.8 % — ABNORMAL LOW (ref 36.0–46.0)
HCT: 26 % — ABNORMAL LOW (ref 36.0–46.0)
Hemoglobin: 7.4 g/dL — ABNORMAL LOW (ref 12.0–15.0)
Hemoglobin: 8.2 g/dL — ABNORMAL LOW (ref 12.0–15.0)
Hemoglobin: 8.4 g/dL — ABNORMAL LOW (ref 12.0–15.0)
Hemoglobin: 8.6 g/dL — ABNORMAL LOW (ref 12.0–15.0)
Hemoglobin: 8.7 g/dL — ABNORMAL LOW (ref 12.0–15.0)
Hemoglobin: 9.1 g/dL — ABNORMAL LOW (ref 12.0–15.0)
MCH: 31.4 pg (ref 26.0–34.0)
MCH: 31.5 pg (ref 26.0–34.0)
MCH: 31.5 pg (ref 26.0–34.0)
MCH: 31.6 pg (ref 26.0–34.0)
MCHC: 33.3 g/dL (ref 30.0–36.0)
MCHC: 33.5 g/dL (ref 30.0–36.0)
MCHC: 33.5 g/dL (ref 30.0–36.0)
MCHC: 33.9 g/dL (ref 30.0–36.0)
MCHC: 33.9 g/dL (ref 30.0–36.0)
MCV: 92.8 fL (ref 78.0–100.0)
MCV: 93.4 fL (ref 78.0–100.0)
MCV: 93.8 fL (ref 78.0–100.0)
Platelets: 173 10*3/uL (ref 150–400)
Platelets: 180 10*3/uL (ref 150–400)
RBC: 2.37 MIL/uL — ABNORMAL LOW (ref 3.87–5.11)
RBC: 2.61 MIL/uL — ABNORMAL LOW (ref 3.87–5.11)
RBC: 2.73 MIL/uL — ABNORMAL LOW (ref 3.87–5.11)
RBC: 2.76 MIL/uL — ABNORMAL LOW (ref 3.87–5.11)
RDW: 12.7 % (ref 11.5–15.5)
RDW: 12.7 % (ref 11.5–15.5)
RDW: 12.9 % (ref 11.5–15.5)
WBC: 5 10*3/uL (ref 4.0–10.5)
WBC: 7.4 10*3/uL (ref 4.0–10.5)
WBC: 7.4 10*3/uL (ref 4.0–10.5)
WBC: 8.1 10*3/uL (ref 4.0–10.5)

## 2010-07-31 LAB — CHLORIDE, URINE, RANDOM: Chloride Urine: <24 mEq/L

## 2010-07-31 LAB — PHOSPHORUS: Phosphorus: 4.5 mg/dL (ref 2.3–4.6)

## 2010-07-31 LAB — PROTIME-INR: INR: 1.08 (ref 0.00–1.49)

## 2010-07-31 LAB — CLOSTRIDIUM DIFFICILE EIA: C difficile Toxins A+B, EIA: NEGATIVE

## 2010-07-31 LAB — STOOL CULTURE

## 2010-07-31 LAB — DIFFERENTIAL
Basophils Relative: 0 % (ref 0–1)
Eosinophils Absolute: 0.3 10*3/uL (ref 0.0–0.7)
Eosinophils Relative: 4 % (ref 0–5)
Eosinophils Relative: 4 % (ref 0–5)
Lymphocytes Relative: 26 % (ref 12–46)
Lymphs Abs: 1.4 10*3/uL (ref 0.7–4.0)
Lymphs Abs: 1.4 10*3/uL (ref 0.7–4.0)
Lymphs Abs: 2.1 10*3/uL (ref 0.7–4.0)
Monocytes Absolute: 0.5 10*3/uL (ref 0.1–1.0)
Monocytes Relative: 8 % (ref 3–12)
Monocytes Relative: 9 % (ref 3–12)
Neutro Abs: 4.8 10*3/uL (ref 1.7–7.7)
Neutrophils Relative %: 61 % (ref 43–77)

## 2010-07-31 LAB — GLUCOSE, CAPILLARY
Glucose-Capillary: 101 mg/dL — ABNORMAL HIGH (ref 70–99)
Glucose-Capillary: 102 mg/dL — ABNORMAL HIGH (ref 70–99)
Glucose-Capillary: 106 mg/dL — ABNORMAL HIGH (ref 70–99)
Glucose-Capillary: 107 mg/dL — ABNORMAL HIGH (ref 70–99)
Glucose-Capillary: 111 mg/dL — ABNORMAL HIGH (ref 70–99)
Glucose-Capillary: 114 mg/dL — ABNORMAL HIGH (ref 70–99)
Glucose-Capillary: 119 mg/dL — ABNORMAL HIGH (ref 70–99)
Glucose-Capillary: 121 mg/dL — ABNORMAL HIGH (ref 70–99)
Glucose-Capillary: 136 mg/dL — ABNORMAL HIGH (ref 70–99)
Glucose-Capillary: 63 mg/dL — ABNORMAL LOW (ref 70–99)
Glucose-Capillary: 74 mg/dL (ref 70–99)
Glucose-Capillary: 85 mg/dL (ref 70–99)
Glucose-Capillary: 87 mg/dL (ref 70–99)
Glucose-Capillary: 93 mg/dL (ref 70–99)

## 2010-07-31 LAB — APTT
aPTT: 39 seconds — ABNORMAL HIGH (ref 24–37)
aPTT: 45 seconds — ABNORMAL HIGH (ref 24–37)

## 2010-07-31 LAB — CULTURE, BLOOD (ROUTINE X 2): Culture: NO GROWTH

## 2010-07-31 LAB — PROCALCITONIN: Procalcitonin: <0.1 ng/mL

## 2010-07-31 LAB — LIPID PANEL
HDL: 30 mg/dL — ABNORMAL LOW (ref >39)
LDL Cholesterol: 104 mg/dL — ABNORMAL HIGH (ref 0–99)
Total CHOL/HDL Ratio: 5.5 RATIO
Triglycerides: 156 mg/dL — ABNORMAL HIGH (ref <150)
VLDL: 31 mg/dL (ref 0–40)

## 2010-07-31 LAB — CROSSMATCH

## 2010-07-31 LAB — CORTISOL: Cortisol, Plasma: 20.2 ug/dL

## 2010-07-31 LAB — TSH: TSH: 0.625 u[IU]/mL (ref 0.350–4.500)

## 2010-07-31 LAB — CARDIAC PANEL(CRET KIN+CKTOT+MB+TROPI)
CK, MB: 3.4 ng/mL (ref 0.3–4.0)
Total CK: 177 U/L (ref 7–177)

## 2010-07-31 LAB — RETICULOCYTES
RBC.: 2.77 MIL/uL — ABNORMAL LOW (ref 3.87–5.11)
Retic Count, Absolute: 36 10*3/uL (ref 19.0–186.0)
Retic Ct Pct: 1.3 % (ref 0.4–3.1)

## 2010-07-31 LAB — RAPID URINE DRUG SCREEN, HOSP PERFORMED
Amphetamines: NOT DETECTED
Opiates: POSITIVE — AB
Tetrahydrocannabinol: NOT DETECTED

## 2010-07-31 LAB — FERRITIN: Ferritin: 82 ng/mL (ref 10–291)

## 2010-07-31 LAB — NA AND K (SODIUM & POTASSIUM), RAND UR: Sodium, Ur: 25 mEq/L

## 2010-07-31 LAB — FOLATE: Folate: >20 ng/mL

## 2010-07-31 LAB — HEMOGLOBIN A1C: Hgb A1c MFr Bld: 6.3 % — ABNORMAL HIGH (ref <5.7)

## 2010-07-31 LAB — MAGNESIUM: Magnesium: 2.3 mg/dL (ref 1.5–2.5)

## 2010-07-31 LAB — STREP PNEUMONIAE URINARY ANTIGEN: Strep Pneumo Urinary Antigen: NEGATIVE

## 2010-07-31 LAB — LACTIC ACID, PLASMA: Lactic Acid, Venous: 0.3 mmol/L — ABNORMAL LOW (ref 0.5–2.2)

## 2010-07-31 LAB — D-DIMER, QUANTITATIVE: D-Dimer, Quant: 0.9 ug/mL-FEU — ABNORMAL HIGH (ref 0.00–0.48)

## 2010-07-31 LAB — SEDIMENTATION RATE: Sed Rate: 73 mm/hr — ABNORMAL HIGH (ref 0–22)

## 2010-08-03 LAB — GLUCOSE, CAPILLARY
Glucose-Capillary: 108 mg/dL — ABNORMAL HIGH (ref 70–99)
Glucose-Capillary: 82 mg/dL (ref 70–99)

## 2010-08-07 ENCOUNTER — Ambulatory Visit: Payer: Medicare Other

## 2010-08-07 ENCOUNTER — Encounter: Payer: Medicare Other | Attending: Physical Medicine & Rehabilitation

## 2010-08-07 DIAGNOSIS — G609 Hereditary and idiopathic neuropathy, unspecified: Secondary | ICD-10-CM

## 2010-08-07 DIAGNOSIS — G547 Phantom limb syndrome without pain: Secondary | ICD-10-CM | POA: Insufficient documentation

## 2010-08-07 DIAGNOSIS — F341 Dysthymic disorder: Secondary | ICD-10-CM

## 2010-08-07 DIAGNOSIS — M79609 Pain in unspecified limb: Secondary | ICD-10-CM | POA: Insufficient documentation

## 2010-08-07 DIAGNOSIS — M1710 Unilateral primary osteoarthritis, unspecified knee: Secondary | ICD-10-CM | POA: Insufficient documentation

## 2010-08-07 DIAGNOSIS — E669 Obesity, unspecified: Secondary | ICD-10-CM

## 2010-08-07 DIAGNOSIS — M069 Rheumatoid arthritis, unspecified: Secondary | ICD-10-CM | POA: Insufficient documentation

## 2010-08-08 DIAGNOSIS — F411 Generalized anxiety disorder: Secondary | ICD-10-CM

## 2010-08-13 ENCOUNTER — Ambulatory Visit: Payer: Medicare Other | Admitting: Internal Medicine

## 2010-08-13 ENCOUNTER — Encounter: Payer: Self-pay | Admitting: Internal Medicine

## 2010-08-13 DIAGNOSIS — Z0289 Encounter for other administrative examinations: Secondary | ICD-10-CM

## 2010-08-13 NOTE — Progress Notes (Unsigned)
  Subjective:    Patient ID: Crystal Fitzpatrick, female    DOB: October 22, 1945, 65 y.o.   MRN: [id number]  HPI    Review of Systems     Objective:   Physical Exam        Assessment & Plan:

## 2010-08-14 ENCOUNTER — Encounter: Payer: Self-pay | Admitting: Internal Medicine

## 2010-08-14 ENCOUNTER — Ambulatory Visit (INDEPENDENT_AMBULATORY_CARE_PROVIDER_SITE_OTHER): Payer: Medicare Other | Admitting: Internal Medicine

## 2010-08-14 ENCOUNTER — Other Ambulatory Visit (INDEPENDENT_AMBULATORY_CARE_PROVIDER_SITE_OTHER): Payer: Medicare Other

## 2010-08-14 ENCOUNTER — Other Ambulatory Visit (INDEPENDENT_AMBULATORY_CARE_PROVIDER_SITE_OTHER): Payer: Medicare Other | Admitting: Internal Medicine

## 2010-08-14 DIAGNOSIS — I509 Heart failure, unspecified: Secondary | ICD-10-CM

## 2010-08-14 DIAGNOSIS — L02419 Cutaneous abscess of limb, unspecified: Secondary | ICD-10-CM

## 2010-08-14 DIAGNOSIS — L03119 Cellulitis of unspecified part of limb: Secondary | ICD-10-CM

## 2010-08-14 DIAGNOSIS — I10 Essential (primary) hypertension: Secondary | ICD-10-CM

## 2010-08-14 DIAGNOSIS — E119 Type 2 diabetes mellitus without complications: Secondary | ICD-10-CM

## 2010-08-14 DIAGNOSIS — Z13220 Encounter for screening for lipoid disorders: Secondary | ICD-10-CM

## 2010-08-14 DIAGNOSIS — L600 Ingrowing nail: Secondary | ICD-10-CM

## 2010-08-14 DIAGNOSIS — M79605 Pain in left leg: Secondary | ICD-10-CM

## 2010-08-14 DIAGNOSIS — D649 Anemia, unspecified: Secondary | ICD-10-CM

## 2010-08-14 DIAGNOSIS — L609 Nail disorder, unspecified: Secondary | ICD-10-CM

## 2010-08-14 DIAGNOSIS — M79609 Pain in unspecified limb: Secondary | ICD-10-CM

## 2010-08-14 LAB — CBC WITH DIFFERENTIAL/PLATELET
Basophils Relative: 0.2 % (ref 0.0–3.0)
Eosinophils Relative: 9.8 % — ABNORMAL HIGH (ref 0.0–5.0)
HCT: 29.9 % — ABNORMAL LOW (ref 36.0–46.0)
Lymphs Abs: 1.6 10*3/uL (ref 0.7–4.0)
MCV: 93.1 fl (ref 78.0–100.0)
Monocytes Absolute: 0.5 10*3/uL (ref 0.1–1.0)
Platelets: 233 10*3/uL (ref 150.0–400.0)
WBC: 6.5 10*3/uL (ref 4.5–10.5)

## 2010-08-14 LAB — BASIC METABOLIC PANEL
BUN: 15 mg/dL (ref 6–23)
CO2: 28 mEq/L (ref 19–32)
Chloride: 101 mEq/L (ref 96–112)
Creatinine, Ser: 1.3 mg/dL — ABNORMAL HIGH (ref 0.4–1.2)
Glucose, Bld: 104 mg/dL — ABNORMAL HIGH (ref 70–99)

## 2010-08-14 LAB — LIPID PANEL
Total CHOL/HDL Ratio: 5
VLDL: 48.6 mg/dL — ABNORMAL HIGH (ref 0.0–40.0)

## 2010-08-14 LAB — HEMOGLOBIN A1C: Hgb A1c MFr Bld: 6.5 % (ref 4.6–6.5)

## 2010-08-14 MED ORDER — DOXYCYCLINE MONOHYDRATE 100 MG PO CAPS: 100.0000 mg | ORAL_CAPSULE | Freq: Two times a day (BID) | ORAL | Status: DC

## 2010-08-14 MED ORDER — GABAPENTIN 300 MG PO CAPS: 300.0000 mg | ORAL_CAPSULE | Freq: Three times a day (TID) | ORAL | Status: DC

## 2010-08-14 NOTE — Assessment & Plan Note (Signed)
Mild, no sign of cellultiis but has significant granular tissue;  To f/u with podiatry

## 2010-08-14 NOTE — Patient Instructions (Signed)
Take all new medications as prescribed - the antibiotic Continue all other medications as before Please go to LAB in the Basement for the blood and/or urine tests to be done today Please call the number on the Blue Card (the [employer] System) for results of testing in 2-3 days You will be contacted regarding the referral for: leg circulation test (arterial dopplers), and home health for needs at home Please continue to follow with your podiatrist as planned
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
## 2010-08-15 NOTE — Assessment & Plan Note (Signed)
Chronic, no overt bleed or bruising, to recheck today

## 2010-08-15 NOTE — Assessment & Plan Note (Signed)
stable overall by hx and exam, most recent lab reviewed with pt, and pt to continue medical treatment as before 

## 2010-08-15 NOTE — Assessment & Plan Note (Signed)
Also with pain to the calf I think is related to the cellulitis, but cant r/o arterial insuff as per podiatry concern as well - for LE arterial dopplers

## 2010-08-15 NOTE — Assessment & Plan Note (Signed)
Acute onset distal left leg above the ankle to mid leg anteriorly approx 4 cm area, with acute swelling on chronic, as well as chronic stasis dermatitis - for antibx today, f/u any worsening symptoms

## 2010-08-18 ENCOUNTER — Other Ambulatory Visit: Payer: Self-pay

## 2010-08-18 NOTE — Telephone Encounter (Signed)
To robin   

## 2010-08-18 NOTE — Telephone Encounter (Signed)
Pt called stating Rx for Lipitor 10mg  qd was not at her pharmacy. I will re-send Rx, will pt need f/u lipids?

## 2010-08-19 NOTE — Telephone Encounter (Signed)
Called patient; left message to call back.

## 2010-08-19 NOTE — Telephone Encounter (Signed)
To robin   

## 2010-08-20 MED ORDER — ATORVASTATIN CALCIUM 10 MG PO TABS: 10.0000 mg | ORAL_TABLET | Freq: Every day | ORAL | Status: DC

## 2010-08-20 NOTE — Telephone Encounter (Signed)
Order for home health has been done, I checked  We'll need her to do labs at the office unfortunately

## 2010-08-20 NOTE — Telephone Encounter (Signed)
Called patient informed of information. The prescription was sent to the incorrect pharmacy. I will send and put in the correct pharmacy. Also the patient is scheduled for OV 09/18/10.  She requested at her last OV a home health nurse, but has not been contacted as of yet. I did not schedule the followup labs as she would like the home health RN to do that.

## 2010-08-20 NOTE — Telephone Encounter (Signed)
Sorry, no labs to be done prior to OV since her primary insurance is medicare

## 2010-08-20 NOTE — Telephone Encounter (Signed)
Called the patient and informed of information. She agreed to do labs in the office. She requested doing labs before her OV in May, if ok let me know what to schedule and will do.

## 2010-08-20 NOTE — Telephone Encounter (Signed)
Called patient; left message to call back.

## 2010-08-21 ENCOUNTER — Other Ambulatory Visit: Payer: Self-pay | Admitting: Cardiology

## 2010-08-21 DIAGNOSIS — M79609 Pain in unspecified limb: Secondary | ICD-10-CM

## 2010-08-21 NOTE — Telephone Encounter (Signed)
Patient returned call and informed of information

## 2010-08-21 NOTE — Telephone Encounter (Signed)
Called patient; left message to call back.

## 2010-08-25 ENCOUNTER — Encounter: Payer: Medicare Other | Admitting: Cardiology

## 2010-08-26 ENCOUNTER — Telehealth: Payer: Self-pay

## 2010-08-26 NOTE — Telephone Encounter (Signed)
Ok for verbal for all;  Let  Me know if I need to do paper referral for the OT, PT

## 2010-08-26 NOTE — Telephone Encounter (Signed)
Crystal Fitzpatrick and informed of information from MD.

## 2010-08-26 NOTE — Telephone Encounter (Signed)
[name] RN with [name] called to request a Crystal Fitzpatrick for the patient. She has cellulitis on her left leg and also an open ulcer. [name] is requesting to apply to the ulcer calcium alginate if ok. Also thinks the patient would benefit from PT and OT evaluation. Please advise.

## 2010-08-27 ENCOUNTER — Encounter: Payer: Medicare Other | Admitting: Cardiology

## 2010-08-28 ENCOUNTER — Telehealth: Payer: Self-pay | Admitting: Internal Medicine

## 2010-08-28 NOTE — Telephone Encounter (Signed)
Crystal Fitzpatrick a PT with [facility] called for a verbal order for PT two times a week for 6 weeks. Also would like parameters for BP (pt's BP was 160/90 yesterday). Please advise

## 2010-08-28 NOTE — Telephone Encounter (Signed)
Ok for verbal  Call for sbp > 180, dbp > 100

## 2010-08-29 NOTE — Telephone Encounter (Signed)
PT informed of BP parameters and verbal for PT

## 2010-09-02 DIAGNOSIS — L02419 Cutaneous abscess of limb, unspecified: Secondary | ICD-10-CM

## 2010-09-02 DIAGNOSIS — I509 Heart failure, unspecified: Secondary | ICD-10-CM

## 2010-09-02 DIAGNOSIS — E119 Type 2 diabetes mellitus without complications: Secondary | ICD-10-CM

## 2010-09-02 DIAGNOSIS — L03119 Cellulitis of unspecified part of limb: Secondary | ICD-10-CM

## 2010-09-02 DIAGNOSIS — S88119A Complete traumatic amputation at level between knee and ankle, unspecified lower leg, initial encounter: Secondary | ICD-10-CM

## 2010-09-05 ENCOUNTER — Encounter: Payer: Medicare Other | Admitting: Cardiology

## 2010-09-08 ENCOUNTER — Encounter: Payer: Medicare Other | Attending: Physical Medicine & Rehabilitation | Admitting: Physical Medicine & Rehabilitation

## 2010-09-08 DIAGNOSIS — L02619 Cutaneous abscess of unspecified foot: Secondary | ICD-10-CM | POA: Insufficient documentation

## 2010-09-08 DIAGNOSIS — M1710 Unilateral primary osteoarthritis, unspecified knee: Secondary | ICD-10-CM | POA: Insufficient documentation

## 2010-09-08 DIAGNOSIS — F341 Dysthymic disorder: Secondary | ICD-10-CM | POA: Insufficient documentation

## 2010-09-08 DIAGNOSIS — M79609 Pain in unspecified limb: Secondary | ICD-10-CM | POA: Insufficient documentation

## 2010-09-08 DIAGNOSIS — G609 Hereditary and idiopathic neuropathy, unspecified: Secondary | ICD-10-CM

## 2010-09-08 DIAGNOSIS — M86129 Other acute osteomyelitis, unspecified humerus: Secondary | ICD-10-CM

## 2010-09-08 DIAGNOSIS — E669 Obesity, unspecified: Secondary | ICD-10-CM

## 2010-09-08 DIAGNOSIS — G547 Phantom limb syndrome without pain: Secondary | ICD-10-CM | POA: Insufficient documentation

## 2010-09-09 NOTE — Assessment & Plan Note (Signed)
Crystal Fitzpatrick is back regarding her multiple issues.  She started seeing Eula Flax in the office for coping strategies and anxiety and as found this very helpful.  He has helped her to develop some strategies to deal with her pain and he has also helped her with setting life goals.  She seems to have gotten the foot hold emotionally and is doing better and as early enjoyed her visits with Dr. Leonides Cave.  Today, her pain is about 5-6/10, notable in the left wrist, the knee and the left foot and right lower extremity as well.  She developed infection in the left foot due to a skin break during toe cutting procedure at her podiatrist. She was placed on antibiotics and seems to be recovering from this now, but still undergoing wound care.  REVIEW OF SYSTEMS:  Notable for the above.  Full 12-point review is in the written health and history section of the chart.  SOCIAL HISTORY:  Unchanged.  PHYSICAL EXAMINATION:  VITAL SIGNS:  Blood pressure is 121/58, pulse 86, respiratory rate 18 and she is satting 95% on room air. GENERAL:  The patient is pleasant, alert and oriented x3.  Affect is generally bright and appropriate.  She is a 9-day improved emotionally from her last visit. EXTREMITIES:  She has some tenderness over the left leg which was wrapped in a Unna dressing.  I did not remove this today.  Right leg has seemed to be appropriate in appearance.  It has slightly swollen, but she has not been wearing her leg or a shrinker for some time there. HEART:  Regular. CHEST:  Clear. ABDOMEN:  Soft and nontender.  She remains obese.  ASSESSMENT: 1. 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 2. Peripheral neuropathy and neuropathic pain. 3. History of rheumatoid arthritis. 4. Anxiety with depression. 5. Osteoarthritis, left knee. 6. Recent cellulitis, left foot.  PLAN: 1. Again, I am very encouraged by her response to psychological     counseling.  We will continue with this.   I think she understands     now that she will have some well element of pain, but that she can     live with this as long she is able to cope with that. 2. I refilled her Percocet and fentanyl patches.  Percocet 10/325 one     q.4-6 h. p.r.n. #130.  We just had refilled her fentanyl the other     day using the [place] brand or generic.  The other generic was     not adhering to her     skin.  Other medications were not due or filled today. 3. I have her see my nurse practitioner about a month.  I will see her     back in about 3.     [name], M.D. Electronically Signed    ZTS/MedQ D:  09/08/2010 13:20:51  T:  09/09/2010 00:53:48  Job #:  [id number]  cc:   Gladstone Pih, Ph.D. [address] [place] [address]

## 2010-09-10 ENCOUNTER — Ambulatory Visit (INDEPENDENT_AMBULATORY_CARE_PROVIDER_SITE_OTHER): Payer: Medicare Other | Admitting: Cardiology

## 2010-09-10 DIAGNOSIS — M79609 Pain in unspecified limb: Secondary | ICD-10-CM

## 2010-09-10 DIAGNOSIS — M79605 Pain in left leg: Secondary | ICD-10-CM

## 2010-09-10 DIAGNOSIS — E1159 Type 2 diabetes mellitus with other circulatory complications: Secondary | ICD-10-CM

## 2010-09-12 ENCOUNTER — Telehealth: Payer: Self-pay

## 2010-09-12 DIAGNOSIS — F411 Generalized anxiety disorder: Secondary | ICD-10-CM

## 2010-09-12 NOTE — Telephone Encounter (Signed)
Crystal Fitzpatrick called to inform MD that pt fell out of her wheelchair this afternoon, no injuries and only a little sore.

## 2010-09-12 NOTE — Telephone Encounter (Signed)
noted 

## 2010-09-14 ENCOUNTER — Encounter: Payer: Self-pay | Admitting: Internal Medicine

## 2010-09-14 DIAGNOSIS — Z0000 Encounter for general adult medical examination without abnormal findings: Secondary | ICD-10-CM | POA: Insufficient documentation

## 2010-09-15 ENCOUNTER — Encounter: Payer: Self-pay | Admitting: Internal Medicine

## 2010-09-15 NOTE — Progress Notes (Signed)
Quick Note:  Voice message left on [employer] system - lab is negative, normal or otherwise stable, pt to continue same tx ______ 

## 2010-09-18 ENCOUNTER — Ambulatory Visit: Payer: Self-pay | Admitting: Internal Medicine

## 2010-09-22 ENCOUNTER — Other Ambulatory Visit: Payer: Self-pay

## 2010-09-22 MED ORDER — DIPHENOXYLATE-ATROPINE 2.5-0.025 MG PO TABS: 1.0000 | ORAL_TABLET | Freq: Two times a day (BID) | ORAL | Status: DC | PRN

## 2010-09-22 NOTE — Telephone Encounter (Signed)
Faxed hardcopy to pharmacy. 

## 2010-09-24 ENCOUNTER — Other Ambulatory Visit (INDEPENDENT_AMBULATORY_CARE_PROVIDER_SITE_OTHER): Payer: Medicare Other | Admitting: Internal Medicine

## 2010-09-24 ENCOUNTER — Encounter: Payer: Self-pay | Admitting: Internal Medicine

## 2010-09-24 ENCOUNTER — Ambulatory Visit (INDEPENDENT_AMBULATORY_CARE_PROVIDER_SITE_OTHER): Payer: Medicare Other | Admitting: Internal Medicine

## 2010-09-24 ENCOUNTER — Other Ambulatory Visit (INDEPENDENT_AMBULATORY_CARE_PROVIDER_SITE_OTHER): Payer: Medicare Other

## 2010-09-24 VITALS — BP 100/72 | HR 85 | Temp 97.9°F | Ht 70.0 in | Wt 274.0 lb

## 2010-09-24 DIAGNOSIS — E785 Hyperlipidemia, unspecified: Secondary | ICD-10-CM | POA: Insufficient documentation

## 2010-09-24 DIAGNOSIS — I509 Heart failure, unspecified: Secondary | ICD-10-CM

## 2010-09-24 DIAGNOSIS — L03119 Cellulitis of unspecified part of limb: Secondary | ICD-10-CM | POA: Insufficient documentation

## 2010-09-24 DIAGNOSIS — Z13220 Encounter for screening for lipoid disorders: Secondary | ICD-10-CM

## 2010-09-24 DIAGNOSIS — I10 Essential (primary) hypertension: Secondary | ICD-10-CM

## 2010-09-24 HISTORY — DX: Hyperlipidemia, unspecified: E78.5

## 2010-09-24 LAB — LIPID PANEL: Cholesterol: 223 mg/dL — ABNORMAL HIGH (ref 0–200)

## 2010-09-24 MED ORDER — NEOMYCIN-COLIST-HC-THONZONIUM 3.3-3-10-0.5 MG/ML OT SUSP: 2.0000 [drp] | Freq: Four times a day (QID) | OTIC | Status: DC

## 2010-09-24 MED ORDER — DOXYCYCLINE MONOHYDRATE 100 MG PO CAPS: 100.0000 mg | ORAL_CAPSULE | Freq: Two times a day (BID) | ORAL | Status: DC

## 2010-09-24 MED ORDER — DIPHENOXYLATE-ATROPINE 2.5-0.025 MG PO TABS: 1.0000 | ORAL_TABLET | Freq: Two times a day (BID) | ORAL | Status: DC | PRN

## 2010-09-24 MED ORDER — FUROSEMIDE 80 MG PO TABS: 80.0000 mg | ORAL_TABLET | Freq: Every day | ORAL | Status: DC

## 2010-09-24 NOTE — Assessment & Plan Note (Signed)
Mild recurrent, for repeat doxy course today,  to f/u any worsening symptoms or concerns

## 2010-09-24 NOTE — Progress Notes (Signed)
Subjective:    Patient ID: Crystal Fitzpatrick, female    DOB: Jul 17, 1945, 65 y.o.   MRN: [id number]  HPI Here to f/u; overall doing ok,  Pt denies chest pain, increased sob or doe, wheezing, orthopnea, PND, increased LE swelling, palpitations, dizziness or syncope.  Pt denies new neurological symptoms such as new headache, or facial or extremity weakness or numbness   Pt denies polydipsia, polyuria, or low sugar symptoms such as weakness or confusion improved with po intake.  Pt states overall good compliance with meds, trying to follow lower cholesterol, diabetic diet, wt overall down several lbs with more activity .  Has been able to lose some wt, and only daily lasix now with LLE overall less swollen, though some redness starting to come back as per last visit better with the doxy.  Did not want to take the lipitor in favor of diet for now.  Would like to check lipids again today.  Right stump for some reason more swollen but not red, painful - ? Dependent edema type. Past Medical History  Diagnosis Date  . DIABETES MELLITUS, TYPE II 07/17/2008  . B12 DEFICIENCY 07/17/2008  . Acute gouty arthropathy 12/11/2008  . HYPONATREMIA 03/20/2010  . HYPERKALEMIA 10/31/2009  . ANEMIA-NOS 07/17/2008  . ANXIETY 07/17/2008  . Chronic pain syndrome 02/14/2010  . Trigeminal neuralgia 02/14/2010  . HEARING LOSS, RIGHT EAR 06/04/2009  . HYPERTENSION 07/17/2008  . CHF 01/28/2009  . Pneumonia, organism unspecified 02/14/2010  . GERD 07/17/2008  . IBS 06/04/2009  . UNSPECIFIED HEPATITIS 01/28/2009  . RENAL INSUFFICIENCY 10/31/2009  . MENOPAUSAL DISORDER 12/03/2009  . Cellulitis and abscess of leg, except foot 11/15/2008  . SKIN ULCER, CHRONIC 02/08/2009  . Rheumatoid arthritis 07/17/2008  . SKIN LESION 06/04/2009  . ARTHRITIS 01/28/2009  . FOOT PAIN 07/17/2008  . HYPERSOMNIA 02/14/2010  . Altered mental status 02/07/2010  . PERIPHERAL EDEMA 08/30/2008  . Wheezing 12/03/2009  . ABDOMINAL DISTENSION 10/11/2008  . Dysuria 11/11/2009  .  Hypoxemia 03/20/2010  . ANAPHYLACTIC SHOCK 05/15/2009  . NEUROPATHY, HX OF 01/28/2009  . COLONIC POLYPS, HX OF 07/17/2008  . AMPUTATION, BELOW KNEE, RIGHT, HX OF 01/28/2009  . Hyperlipemia 09/24/2010  . Hyperlipidemia 09/24/2010   Past Surgical History  Procedure Date  . Amputation 02/02/2008    below right knee  . S/p egd 2007    with Botox for? Achalasia  . Tonsillectomy   . S/p breast biopsy 1992  . 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 Tubal ligation   . Nasal septum surgery   . Foot surgury 1980 and 1981    reports that she has quit smoking. She does not have any smokeless tobacco history on file. She reports that she does not drink alcohol or use illicit drugs. family history includes Breast cancer in her sister; Diabetes in her other; and Stroke in her other. Allergies  Allergen Reactions  . Ciprofloxacin   . Erythromycin   . Morphine   . Nitrofurantoin     REACTION: itch  . Sulfonamide Derivatives    Current Outpatient Prescriptions on File Prior to Visit  Medication Sig Dispense Refill  . albuterol (PROAIR HFA) 108 (90 BASE) MCG/ACT inhaler Inhale 2 puffs into the lungs every 6 (six) hours as needed.        . ALPRAZolam (XANAX) 0.25 MG tablet Take 0.25 mg by mouth 3 (three) times daily as needed.        . Ascorbic Acid (VITAMIN C) 500 MG  tablet Take 500 mg by mouth daily.        . Cholecalciferol (VITAMIN D) 400 UNITS capsule Take 400 Units by mouth daily.        . DULoxetine (CYMBALTA) 60 MG capsule Take 60 mg by mouth 2 (two) times daily.        . fentaNYL (DURAGESIC - DOSED MCG/HR) 75 MCG/HR Place 1 patch onto the skin. 1 patch every 3 days       . gabapentin (NEURONTIN) 300 MG capsule Take 1 capsule (300 mg total) by mouth 3 (three) times daily. 1 by mouth in the AM, 1/2 by mouth in the afternoon, then 2 by mouth in the PM.      . lidocaine (LIDODERM) 5 % Place 1 patch onto the skin. Remove & Discard patch within 12 hours or as directed by MD       . loperamide  (ANTI-DIARRHEAL) 2 MG tablet Take 2 mg by mouth 2 (two) times daily as needed.        . Multiple Vitamins-Minerals (CENTRUM SILVER ULTRA WOMENS PO) Take by mouth daily.        [name] olmesartan (BENICAR) 40 MG tablet Take 40 mg by mouth daily.        . Omega-3 Fatty Acids (FISH OIL) 1000 MG CAPS Take by mouth daily.        . pantoprazole (PROTONIX) 40 MG tablet Take 40 mg by mouth daily.        . promethazine (PHENERGAN) 25 MG tablet Take 25 mg by mouth every 6 (six) hours as needed.        . Psyllium (METAMUCIL) 30.9 % POWD Take by mouth. Take as directed       . topiramate (TOPAMAX) 100 MG tablet Take 100 mg by mouth 3 (three) times daily.        [name] DISCONTD: diphenoxylate-atropine (LOMOTIL) 2.5-0.025 MG per tablet Take 1 tablet by mouth 2 (two) times daily as needed.  30 tablet  2  . DISCONTD: furosemide (LASIX) 80 MG tablet Take 80 mg by mouth 2 (two) times daily.        [name] DISCONTD: neomycin-colistin-hydrocortisone-thonzonium (CORTISPORIN-TC) 3.07-18-08-0.5 MG/ML otic suspension Place 2 drops into the right ear 4 (four) times daily.        [name] oxyCODONE-acetaminophen (PERCOCET) 10-325 MG per tablet Take 1 tablet by mouth every 6 (six) hours as needed.        [name] DISCONTD: acetaminophen (TYLENOL) 500 MG tablet Take 500 mg by mouth every 4 (four) hours as needed.        [name] DISCONTD: atorvastatin (LIPITOR) 10 MG tablet Take 1 tablet (10 mg total) by mouth daily.  30 tablet  11  . DISCONTD: doxycycline (MONODOX) 100 MG capsule Take 1 capsule (100 mg total) by mouth 2 (two) times daily.  60 capsule  2   Review of Systems Review of Systems  Constitutional: Negative for diaphoresis and unexpected weight change.  HENT: Negative for drooling and tinnitus.   Eyes: Negative for photophobia and visual disturbance.  Respiratory: Negative for choking and stridor.   Gastrointestinal: Negative for vomiting and blood in stool.  Genitourinary: Negative for hematuria and decreased urine volume.  Musculoskeletal:  Negative for gait problem.  Skin: Negative for color change and wound.  Neurological: Negative for tremors and numbness.  Psychiatric/Behavioral: Negative for decreased concentration. The patient is not hyperactive.       Objective:   Physical Exam BP 100/72  Pulse 85  Temp(Src) 97.9 F (  36.6 C) (Oral)  Ht 5\' 10"  (1.778 m)  Wt 274 lb (124.286 kg)  BMI 39.32 kg/m2  SpO2 96% Physical Exam  VS noted Constitutional: Pt appears well-developed and well-nourished.  HENT: Head: Normocephalic.  Right Ear: External ear normal.  Left Ear: External ear normal.  Eyes: Conjunctivae and EOM are normal. Pupils are equal, round, and reactive to light.  Neck: Normal range of motion. Neck supple.  Cardiovascular: Normal rate and regular rhythm.   Pulmonary/Chest: Effort normal and breath sounds normal.  Abd:  Soft, NT, non-distended, + BS Neurological: Pt is alert. No cranial nerve deficit.  Skin: Skin is warm. No erythema. except for mild recurrent left leg erythema small area, with weeping but less than previous, no red streaks or abscess Psychiatric: Pt behavior is normal. Thought content normal. 1+ nervous Right stump with unusual diffuse swelling, no erythema, tender or ulcer       Assessment & Plan:

## 2010-09-24 NOTE — Patient Instructions (Addendum)
Continue all other medications as before Please go to LAB in the Basement for the blood and/or urine tests to be done today Please call the number on the Blue Card (the [employer] System) for results of testing in 2-3 days Please return in 6 months

## 2010-09-24 NOTE — Assessment & Plan Note (Signed)
stable overall by hx and exam, most recent lab reviewed with pt, and pt to continue medical treatment as before  Lab Results  Component Value Date   WBC 6.5 08/14/2010   HGB 9.9* 08/14/2010   HCT 29.9* 08/14/2010   PLT 233.0 08/14/2010   CHOL 228* 08/14/2010   TRIG 243.0* 08/14/2010   HDL 42.00 08/14/2010   LDLDIRECT 154.1 08/14/2010   ALT 9 02/08/2010   AST 7 02/08/2010   NA 135 08/14/2010   K 4.3 08/14/2010   CL 101 08/14/2010   CREATININE 1.3* 08/14/2010   BUN 15 08/14/2010   CO2 28 08/14/2010   TSH 0.625 02/08/2010   INR 1.04 02/08/2010   HGBA1C 6.5 08/14/2010   MICROALBUR 2.1* 12/03/2009

## 2010-09-24 NOTE — Assessment & Plan Note (Signed)
stable overall by hx and exam, most recent lab reviewed with pt, and pt to continue medical treatment as before  BP Readings from Last 3 Encounters:  09/24/10 100/72  08/14/10 132/80  03/20/10 120/80

## 2010-09-24 NOTE — Assessment & Plan Note (Signed)
stable overall by hx and exam, most recent lab reviewed with pt, and pt to continue medical treatment as before   To re-check lipids today, I suspect she will need to start the statin

## 2010-09-25 ENCOUNTER — Other Ambulatory Visit: Payer: Self-pay | Admitting: Internal Medicine

## 2010-09-25 MED ORDER — ATORVASTATIN CALCIUM 10 MG PO TABS: 10.0000 mg | ORAL_TABLET | Freq: Every day | ORAL | Status: DC

## 2010-09-30 NOTE — Op Note (Signed)
NAMEMarland Kitchen  Crystal Fitzpatrick, Crystal Fitzpatrick NO.:  [id number]   MEDICAL RECORD NO.:  [id number]          PATIENT TYPE:  INP   LOCATION:  3022                         FACILITY:  [facility]   PHYSICIAN:  Mark C. Ophelia Charter, M.D.    DATE OF BIRTH:  Oct 30, 1945   DATE OF PROCEDURE:  02/02/2008  DATE OF DISCHARGE:                               OPERATIVE REPORT   PREOPERATIVE DIAGNOSIS:  Grade 4 right calcaneal ulcer with foot and  ankle abscess.   POSTOPERATIVE DIAGNOSIS:  Grade 4 right calcaneal ulcer with foot and  ankle abscess.   PROCEDURE:  Right below-knee amputation.   SURGEON:  Annell Greening, MD   ANESTHESIA:  GOT.   DRAINS:  One Penrose.   ESTIMATED BLOOD LOSS:  100 mL.   BRIEF HISTORY:  This 65 year old female was on large dosages of chronic  narcotics and also has severe diabetes.  She has been at home in bed and  had problems with wound care in home care setting.  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  She was taking 30 mg of Percocet 10 a day to  14 a day.   PROCEDURE:  After induction of general anesthesia, proximal thigh  tourniquet, dressing was unwrapped and there was purulence draining out.  Squeezing the foot, pus came out down the posterior tibial sheath around  the posterior tibial nerve neurovascular bundle.  Foot was nonviable and  it was covered with  Betadine Biodrape.  Despite antibiotics, there had  been progression over the last 36 hours of the infection.  Betadine  Biodrape covered the foot sealing it, and DuraPrep was used up to the  tourniquet, split sheets drapes, impervious stockinette and Coban were  applied.  Fishmouth incision was made to stay above the brawny edema  that was in the distal third of the tibia.  Leg was elevated and  tourniquet was then inflated at 400.  Veins were single ligated,  arteries were double ligated unless they were very tiny and were  coagulated.  All nerves  were cut back on stretch.  Tibia was cut  transversely with oscillating saw 7 fingerbreadths below the tibial  tubercle.  The tibia was cut 2 cm proximal to that.  Amputation was  completed.  Posterior musculature was thinned and the gastroc soleus  fascia was sewn up to the anterior tibial fascia after tourniquet was  deflated.  Hemostasis was obtained.  The lesser saphenous nerve was cut  on stretch.  Fascia was closed with 0 Vicryl interrupted, 2-0 Vicryl  subcutaneous tissue, and 2-0 nylon skin sutures.  Penrose drain was  placed in the wound and large Ethibond was attached to it for later  removal without having to take the dressing off.  Xeroform, 4 x 4s,  ABDs, Kerlix and Coban were applied for postoperative dressing.  Instrument count and needle count was correct.  Drain is to be pulled on  Sunday.      Mark C. Ophelia Charter, M.D.  Electronically Signed     MCY/MEDQ  D:  02/02/2008  T:  02/03/2008  Job:  [id number]

## 2010-09-30 NOTE — H&P (Signed)
NAMEMarland Kitchen  Crystal Fitzpatrick NO.:  [id number]   MEDICAL RECORD NO.:  [id number]          PATIENT TYPE:  INP   LOCATION:  1853                         FACILITY:  [facility]   PHYSICIAN:  Lonia Blood, M.D.DATE OF BIRTH:  May 23, 1945   DATE OF ADMISSION:  01/30/2008  DATE OF DISCHARGE:                              HISTORY & PHYSICAL   PRIMARY CARE PHYSICIAN:  Unassigned.   CHIEF COMPLAINT:  Right lower extremity cellulitis.   HISTORY OF PRESENT ILLNESS:  Crystal Fitzpatrick is a 65 year old morbidly  obese diabetic who suffers with severe bilateral lower extremity chronic  venous insufficiency.  She did have a normal echocardiogram in 2008.  She has been suffering with bilateral heel ulcers now for innumerable  months.  She apparently has had wound care in a home care setting.  For  the last 1-2 weeks at minimum, she has noted severe erythema around the  right ankle and foot.  There has been a strong odor from the foot.  As a  result, the patient was evaluated by her wound care nurse again today.  After evaluating the wound, she was sent to the emergency room for  evaluation.  The patient clearly has a very strong cellulitis which is  not improving well at home.  The patient herself presently is somewhat  sedated due to narcotic pain medication that she took apparently prior  to arriving in the emergency room.  She cannot provide further reliable  history.   REVIEW OF SYSTEMS:  Comprehensive review of systems cannot be entirely  obtained as the patient is quite groggy and lethargy.  She does,  however, state that she is not having any other problems with exception  to her lower extremity symptoms.   PAST MEDICAL HISTORY:  1. Hypertension.  2. Diabetes mellitus type 2 with peripheral neuropathy.  3. History of hepatitis A.  4. Obesity.  5. Rheumatoid arthritis.  6. History of trigeminal neuralgia.  7. Chronic venous insufficiency with a normal echocardiogram in  2008.  8. Prior history of dysphagia with extensive workup, but apparently on      no modified diet at the present time.  9. Status post bilateral tubal ligation.  10.Status post surgical correction of a deviated septum.   MEDICATIONS:  1. Xanax - unknown dose q.8 h.  2. Benadryl p.r.n.  3. Lasix 40 mg daily.  4. Neurontin 100 mg q.i.d.  5. Metoprolol 25 mg - questionably b.i.d.  6. Micardis 80 mg daily.  7. Oxycodone 30 mg - patient states that she takes up to 14 a day.  8. Zofran as needed.   ALLERGIES:  CIPRO, ERYTHROMYCIN, SULFA, NONSTEROIDS ARE ALL REPORTEDLY  ALLERGIES WHICH LEAD TO ANAPHYLAXIS.   FAMILY HISTORY:  The patient's mother has died from breast cancer and  was 70 at the time of her death.  The patient's father died from colon  cancer and was 69 at the time of his death.  The patient has a sister  who suffers with breast cancer.   SOCIAL HISTORY:  The patient does not smoke.  She does not  drink.  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 and the [employer].  She lives in  [place].   LABORATORY DATA:  Hemoglobin is low at 9.7 with a normal MCV.  Platelet  count is normal.  White count is elevated at 19.7.  Sodium is low at 126  with a normal chloride.  Electrolytes are otherwise normal.  BUN is 13  with a creatinine of 0.97.  Serum glucose is 90.  No other data is  available.   PHYSICAL EXAMINATION:  VITAL SIGNS:  Temperature 98.2, blood pressure  103/48, heart rate 85, respiratory rate 16, O2 sats 95% on room air.  GENERAL:  Obese female who is very lethargic, but in no acute  respiratory distress.  HEENT:  Normocephalic, atraumatic.  Pupils are minimally reactive, but  equal.  NECK:  No JVD.  LUNGS:  Clear to auscultation without wheezes or rhonchi.  CARDIOVASCULAR:  Regular rate and rhythm without murmur, gallop or rub.  Normal S1-S2.  ABDOMEN:  Obese, soft.  Bowel sounds present.  No  organomegaly, no  rebound, no ascites.  EXTREMITIES:  1+ left lower extremity edema without erythema and 3+  right lower extremity edema.  There is significant erythema about the  dorsum of the foot, plantar aspect of the foot and extending up the  entire leg in a circumferential pattern to approximately one foot below  the knee.  There are approximate half-dollar sized punched out classic  diabetic heal ulcerations bilaterally.  The right foot has a significant  amount of erythema and some purulent discharge from this region.  The  erythematous skin about the right lower extremity has a vesicular type  pattern as well, but no purulent discharge from this area.  NEUROLOGIC:  The patient is lethargic as noted above secondary to  narcotics that she took apparently prior to presenting to the ER.  She  does moves all four extremities, however.  Cranial nerves II through XII  appear to be intact bilaterally.   IMPRESSION/PLAN:  1. Severe right lower extremity diabetic foot/cellulitis - Crystal Fitzpatrick      has a very serious infection of the right lower extremity.  This      may in fact be limb threatening.  She will be placed in the acute      unit.  She will be doses with IV Zosyn and vancomycin given the      narrow room for miscoverage that we have.  We will assess her lower      extremity with venous Dopplers to rule out deep venous thrombosis.      My suspicion is low for this.  We will also assess with ankle-      brachial indices to assess the likelihood of resolution with      antibiotic therapy.  Plain film x-rays will be carried out to help      assess for the possibility of osteomyelitis of the calcaneus and      also to help rule out foreign bodies.  If the patient fails to      improve with IV antibiotics, we will need to consider a CT scan for      further evaluation and to rule out pockets of infection/abscess.  2. Diabetes mellitus type 2 - strict control of the patient's  diabetes      will be an absolute requirement.  She will be placed on sliding  scale insulin.  We will follow her CBG very closely.  3. Hyponatremia - this likely represents a hyponatremia related to      ongoing Lasix therapy.  We will hold the patient's Lasix and we      will hydrate her gently with normal saline.  We will follow her      sodium closely.  4. Hypertension - the patient's blood pressure is currently well      controlled.  We will continue her metoprolol and Micardis as her      systolic allows.  We will hydrate her gently and follow her trend.  5. Normocytic anemia - this likely represents anemia of chronic      disease related to the patient's chronic inflammatory state due to      lower extremity ulcers.  We will check an anemia panel and we will      guaiac her stools as a precaution.      Lonia Blood, M.D.  Electronically Signed     JTM/MEDQ  D:  01/30/2008  T:  01/30/2008  Job:  [id number]

## 2010-09-30 NOTE — Assessment & Plan Note (Signed)
Crystal Fitzpatrick is back regarding her right below-knee amputation and  stump/phantom pain symptoms.  As a whole, she continues to do better.  She was suffering from norovirus and this had set her back recently, but  generally is improving.  Her pain is 3-5/10.  She is having some stump  pain as well as left leg phantom symptoms and occasional back and  shoulder pain.  Rob is working with her in therapy and she is improving  her gait.  It does seem that she needs a new socket to ensure  appropriate fit.  The patient has come off Dilaudid without any issues.  She is using Percocet 10/325 one q.6 hours p.r.n.  She uses fentanyl  patch 50 mcg q.72 hours.  She maintains on Flexeril, Lyrica, and  Cymbalta at current doses.  Lyrica is 100 mg t.i.d.  The patient's pain  score is 3-5/10.  Oswestry percentage is 42%.  Pain interferes with  general activity, relations with others, enjoyment of life on a moderate  level.  Pain worsens with walking and activity, improves with some  exercises therapy, pacing, and medications.   REVIEW OF SYSTEMS:  Notable for numbness, skin rash.  Other pertinent  positives are above.  A full review is in the written health and history  section of the chart.   SOCIAL HISTORY:  The patient continues to live in an assisted living,  but would like to get an apartment of her own soon.   PHYSICAL EXAMINATION:  Blood pressure is 117/57, pulse is 75,  respiratory rate 18.  She is sating 93% on room air.  The patient is  generally pleasant, alert and oriented x3.  Affect is generally bright  and appropriate.  Right leg was in the socket today and appeared to be  quite a bit loose within the socket.  Left leg was intact with some  trace edema.  Continues to have some slight pain in the shoulder/rotator  cuffs with impingement maneuvers today.  Has had decreased sensation  over both legs, particularly left lower extremity today.  Heart is  regular.  Chest is clear.  Cognitively, she  is well intact.  She remains  overweight.   ASSESSMENT:  1. 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  2. Peripheral neuropathy.  3. Rheumatoid arthritis.  4. Obesity.  5. Hypertension.  6. 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  7. Rotator cuff tendonitis bilaterally.   PLAN:  1. Continue fentanyl patch 50 mcg q.72 with Percocet 10/325 one q.6      p.r.n.  2. We will titrate Lyrica 150 mg t.i.d.  3. Maintain Flexeril, Lyrica, and Cymbalta at same doses.  4. Encouraged ice and strengthening exercises for the left knee.  5. I will see her back in 2 months with 54-month followup in Nurse      Clinic.  She continues to make nice progress.  I am very pleased      with her gait in particular.      [name], M.D.  Electronically Signed     ZTS/MedQ  D:  07/18/2008 13:57:57  T:  07/19/2008 03:54:11  Job #:  [id number]   cc:   Georgann Housekeeper, MD  Fax: [phone]

## 2010-09-30 NOTE — Assessment & Plan Note (Signed)
Crystal Fitzpatrick is back regarding her right below knee amputation and  significant stump and phantom-like pains.  I saw her last month on  initial evaluation.  We continued titrating Lyrica upward.  We started a  trial of Cymbalta.  We changed Flexeril p.r.n.  She continues with her  fentanyl patch, Neurontin, and Dilaudid as prior.  She is now planning  on going to an assisted living over the next week or so, and she is  excited about that.  Over the last few weeks, her pain has decreased  dramatically.  Her pain today is 2/10.  Her Oswestry score is roughly at  30%, although this is not completely accurate due to form discrepancies.  Pain is described as sharp, burning, stabbing, constant, tingling in the  feet bilaterally, although improved.  Sleep is fair.  She has more pain  with walking and standing.  Her left knee tends to bother her when she  is up, trying to move and transfer, etc.   REVIEW OF SYSTEMS:  Notable for numbness and tingling.  Other pertinent  positives are above and full review is in the written health and history  section in the chart.   SOCIAL HISTORY:  As noted above.   PHYSICAL EXAMINATION:  VITAL SIGNS:  Blood pressure is 100/57, pulse is  86, respiratory rate 18, and she is sating 96% on room air.  GENERAL:  The patient is pleasant, very happy today.  Her weight is  stable.  HEART:  Regular.  CHEST:  Clear.  ABDOMEN:  Soft and nontender.  EXTREMITIES:  Lower extremity coordination is generally unchanged.  She  continues to have 1/2 sensation in the hands and strength is 4/5 to 5/5  in both lower extremities.  Stump shrinker is in place and leg was  stable and less tender to touch overall.  NEUROLOGICAL:  Cognitively, she is well with an age-associated limits.  Cranial nerve exam is intact.   ASSESSMENT:  1. Right below-knee amputation with neuropathic/phantom pain/stump      pain.  2. Peripheral neuropathy.  3. History of rheumatoid arthritis.  4. Obesity.  5. Hypertension.  6. Questionable osteoarthritis, left knee.   PLAN:  1. We will continue his medications for now.  Goal would be to start      weaning off some of these medications including the Dilaudid and      try something a little less potent as a breakthrough medication.  I      also would like to wean Neurontin.  2. I think it would be smart to send her to outpatient training for a      prosthesis to a local expert at [facility] on      [address]  She agreed and seemed very excited about this.  She      can go with the facility therapist until we get this arranged, but      hopefully we can start this in the new year.  3. Continue Flexeril, Lyrica, and Cymbalta as current dose.  4. I refilled fentanyl patch and Dilaudid today.  5. I will see her back in a month.  I am very pleased with her      progress.      [name], M.D.  Electronically Signed     ZTS/MedQ  D:  05/08/2008 18:34:37  T:  05/09/2008 06:17:52  Job #:  [id number]   cc:   Georgann Housekeeper, MD  Fax: [phone]

## 2010-09-30 NOTE — H&P (Signed)
NAME:  Crystal Fitzpatrick, Crystal Fitzpatrick NO.:  [id number]   MEDICAL RECORD NO.:  [id number]          PATIENT TYPE:  EMS   LOCATION:  ED                           FACILITY:  [facility]   PHYSICIAN:  Crystal Fitzpatrick, M.D.DATE OF BIRTH:  Mar 05, 1946   DATE OF ADMISSION:  01/26/2007  DATE OF DISCHARGE:                              HISTORY & PHYSICAL   PRIMARY CARE PHYSICIAN:  Crystal Fitzpatrick in [facility].   CHIEF COMPLAINT:  Nausea and vomiting.   HISTORY OF PRESENTING COMPLAINT:  Crystal Fitzpatrick is a pleasant 65 year old  Caucasian female who started having nausea and vomiting about two days  ago.  Prior to this, she was unable to take good p.o. food and fluid  because of recent oral surgery.  She had multiple teeth extractions  involving her lower dentures.  She has been planned for artificial  dentures.  Postoperatively, she developed facial pain which is thought  to be trigeminal neuralgia and was started on Trileptal.   Every since she started on Trileptal about three weeks ago, she has been  progressively lethargic and lost her appetite.  She relates the nausea  and vomiting to the Trileptal.  Patient describes epigastric discomfort,  but this has subsided.  She has no diarrhea.  In fact, she is  constipated.  Her last bowel movement was three days ago, which is  unusual for her.  She denies fever.  There is no crampy abdominal pain.  The patient admits to having a sick contact in a neighbor who had nausea  and vomiting about a week ago.   She was evaluated in the emergency department and was noted to have a  sodium of 119; hence [facility] was called for admission.  Patient has  not had a change in her mental status.   REVIEW OF SYSTEMS:  She denies cough, chest pain, shortness of breath.  There are no headaches or change in her vision. She denies dysuria or  urgency.  No fever or diarrhea.  However, patient is constipated.   PAST MEDICAL HISTORY:  1. History of dysphagia,  which patient attributes to an anaphylactic      reaction in August, 2007.  She was seen by Crystal Fitzpatrick at that time      and had a pan endoscopy and barium swallow.  There was no evidence      of a stricture or dysmotility.  Patient subsequently underwent      Botox injection.  According to the patient, this was not      successful.  2. Chronic lower extremity edema, which is felt to be venous stasis.      She was seen by Crystal Fitzpatrick from [facility].  She had an      echocardiogram with a normal ejection fraction and normal diastolic      parameters in December, 2007.  3. Diabetes mellitus type 2.  4. Hypertension.  5. Obesity.  6. 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  7. Obesity.   PAST SURGICAL HISTORY:  1. Bilateral tubal ligation.  2. Surgery for deviated septum.  CURRENT MEDICATIONS:  1. Oxycodone 13 mg q.4h. p.r.n.  2. Spironolactone 50 mg daily.  3. Xanax 0.5 mg q.8h. p.r.n.  4. Metoprolol 50 mg b.i.d.  5. Lasix 20 mg daily.  6. Metformin 850 mg daily.  7. Phenergan p.r.n.  8. Micardis 40 mg daily.  9. Prevacid daily.  10.Trileptal 300 mg b.i.d.   ALLERGIES:  1. CIPROFLOXACIN.  2. ERYTHROMYCIN.  3. SULFA.  4. NSAIDS.  5. STEROIDS.  6. __________ .  7. LATEX.  8. __________ .   FAMILY HISTORY:  Both parents are deceased.  Mother passed away from  breast cancer at the age of 69.  Father passed away from colon cancer at  the age of 51.  The patient has a sister who has been diagnosed with  breast cancer twice.  The patient's last mammography was two years ago.  I have encouraged her to follow up with mammography.   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  Patient does not smoke cigarettes or drink  alcohol.  She lives independently at home.  She has no family in  [place].  She is capable of activities of daily living.   PHYSICAL EXAMINATION:  INITIAL VITALS:  Temperature  96.6, blood pressure  170/76, pulse 62, respiratory rate 20, O2 sats of 97% on room air.  On examination, she is awake, alert, oriented to time, place, and  person.  Normocephalic and atraumatic head.  Mucous membranes moist.  No  oral thrush. Oropharynx looks clean.  No bleeding.  LUNGS:  Clear clinically to auscultation.  S1 and S2.  No murmur, no gallop, no rub.  ABDOMEN:  Obese, soft, nontender.  Bowel sounds present.  EXTREMITIES:  Bilateral pitting pedal edema, right more than left. (  Patient states that these are  chronic changes in both lower extremity  swelling).  CNS:  Negative for chronic neurological deficit.   LABORATORY DATA:  Sodium 119, potassium 4.5, chloride 85, bicarb 26,  glucose 115, BUN 5, creatinine 0.91, calcium 7.4, albumin 3.2, AST 37,  ALT 9, bilirubin 0.8.  White cells 8.4, hemoglobin 11.3, hematocrit  32.6, platelets 262.  Differential:  Absolute granulocyte count 7.2,  which is within normal.   ASSESSMENT/PLAN:  Ms. Fitzpatrick is a 65 year old Caucasian female presenting  with nausea, vomiting, constipation, and hyponatremia with a sodium of  119.  She is oriented to time, place, and person.  Patient will be  admitted for further evaluation and treatment.   ADMISSION DIAGNOSIS:  1. Hyponatremia:  This is secondary to nausea, vomiting, and poor p.o.      intake.  The Trileptal is also a possible positive agency.  Patient      is however, asymptomatic.  She will be given IV fluids normal      saline at 125 cc/hr with potassium supplement.  Will check a urine      sodium, urine osmolality, and serum osmolality. Check TSH and      follow up BMET in the morning.  2. Nausea and vomiting:  Probably secondary to viral etiology versus      medications (Trileptal).  Patient will be managed symptomatically.      Will obtain lipase level.  Check abdominal x-ray to rule out ileus      and partial small bowel obstruction.  Will discontinue Trileptal.      She will be  placed on clear liquids.  3. Trigeminal neuralgia:  Will substitute  Trileptal with Neurontin 100      mg t.i.d.  4. History of chronic lower extremity edema:  I am holding Lasix and      spironolactone for now.  Patient needs IV fluid hydration.  5. Diabetes mellitus x2:  Will hold Metformin for now while the      patient is in clear liquids.  She will be placed on sliding-scale      insulin.  Metformin will be resumed once she can advance her diet.  6. Hypocalcemia:  The patient's corrected calcium.  Her albumin is      7.74.  She is asymptomatic.  Will check BMET again in the morning.      Crystal Fitzpatrick, M.D.  Electronically Signed     MBB/MEDQ  D:  01/26/2007  T:  01/26/2007  Job:  [id number]

## 2010-09-30 NOTE — Discharge Summary (Signed)
NAMEMarland Kitchen  Crystal Fitzpatrick, Crystal Fitzpatrick NO.:  [id number]   MEDICAL RECORD NO.:  [id number]          PATIENT TYPE:  INP   LOCATION:  5001                         FACILITY:  [facility]   PHYSICIAN:  Estelle Grumbles, MDDATE OF BIRTH:  1946-05-08   DATE OF ADMISSION:  01/30/2008  DATE OF DISCHARGE:  02/15/2008                               DISCHARGE SUMMARY   DISPOSITION:  To [facility] facility.   DISCHARGE MEDICATIONS:  1. Norvasc 5 mg daily.  2. Vitamin B12 1000 mcg intramuscularly q. monthly.  3. Fentanyl patch 25 mcg q.72 hours.  4. Lasix 40 mg twice a day.  5. Neurontin 200 mg orally three times a day hold if the patient is      sleepy or hypertensive.  6. Lantus 5 units subcutaneous q.h.s.  7. __________ 50 mg orally three times a day.  8. Benicar 40 mg orally daily.  9. Protonix 40 mg orally daily.  10.K-Dur 20 mEq orally twice a day.  11.Dilaudid 1 mg orally q.6 hourly as needed for pain.  12.Xanax 0.5 mg orally q.8 hourly as needed for anxiety.  13.Artificial tears as needed.  14.Hold the Dilaudid and Xanax if the patient is sleepy or having      hallucinations or hypertensive.   HOSPITAL CONSULTATIONS:  Antonietta Breach, M.D., [facility] and  Veverly Fells. Ophelia Charter, M.D., orthopedics.   Please review the summary done on September 22 for detailed description  of hospital course.   The patient did continue taking fluids, however, she was having episodes  of diarrhea initially thought might be C. difficile colitis.  The  patient was kept on isolation and stool studies were ordered.  However,  diarrhea has resolved by itself and no further episodes of diarrhea were  noted.   The patient was having mental status changes, having delirium episodes.  She was evaluated by Dr. Jeanie Sewer.  She was initially started on  Zyprexa 2.5 mg daily.  Zyprexa was started as needed basis.  Her blood  pressure medications were adjusted.  She was also followed by  nutritionist who  recommended Ensure one can orally 2 times a day.  The  patient was having significant issues with her narcotics.  The patient  was receiving OxyContin, Percocet, Dilaudid IV as needed.  Subsequently  the patient started refusing OxyContin stating that it was making her  mental status worse and that she thinks she is getting addicted to it  and does not want to take it anymore and currently she was taking  Dilaudid IV as needed.  It was changed to fentanyl patch 25 mcg.  She  has been receiving Dilaudid 0.5 mg IV q.6 hourly, so fentanyl patch 25  mcg was started and Dilaudid 2 mg p.o. q.6 hourly as needed was added  for breakthrough pain.  Currently the patient seems euphoric.  I am  going to decrease the Dilaudid to 1 mg orally q.6 hourly as needed and I  will discontinue the OxyContin and Percocet.  I will also change her  Neurontin to 200 mg three times a day.  The patient will need  to follow  up with pain management as outpatient at the nursing facility.  Pain  medications have to be adjusted closely.  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  Her narcotic intake  has to be monitored very closely.  She also needs to follow up with  wound care as an outpatient.   During this hospital stay she was followed by speech therapy.  She had  some functional oropharyngeal dysphagia so she was on pureed diet with  mechanical soft diet  and she was also placed on lactose-free diet.  She  has been tolerating this diet well.   She underwent MRI of the lumbosacral spine which revealed moderate  stenosis in the L4-5 regions.  The patient states she had this stenosis  quite a long time so we have recommended her to follow up with  neurologist as an outpatient regarding her chronic low back pain and  stenosis.   Wound care with hydrogel dressing to left heel daily.  She needs skin  care precautions.   FOLLOWUP:  The patient needs to follow up with primary care  physician at  nursing facility.  I strongly recommend followup at the pain management  center at [facility] and I also recommend a followup with Dr. Ophelia Charter,  surgery, for followup of her amputation site and her lower extremity  wound.  I would recommend a followup with Dr. Ophelia Charter in 10 days.      Estelle Grumbles, MD  Electronically Signed     TP/MEDQ  D:  02/15/2008  T:  02/15/2008  Job:  [id number]   cc:   Veverly Fells. Ophelia Charter, M.D.  [facility]

## 2010-09-30 NOTE — Assessment & Plan Note (Signed)
Crystal Fitzpatrick is back regarding her phantom and stump pain.  She had some  edema developed before I saw her last time, so we backed off her Lyrica  down to the 100 mg t.i.d. dose.  I will start her on Topamax about 2  weeks ago at 25 mg nightly.  She has had some good results with this  combination and seems to be losing some weight and seeing her swelling  decreased.  She is able to put her prosthesis on today in fact.  Today,  she rates her pain at 2/10 described as sharp, stabbing, aching,  constant.  Pain interferes with general activity, relations with others,  enjoyment of life on a moderate level.   REVIEW OF SYSTEMS:  Notable for tremor, spasms, weakness.  Other  pertinent positives as above and full 14-point review is in the written  health and history section of the chart.   SOCIAL HISTORY:  The patient is in an assisted living apartment.  Her  friend is with her today who assists her at home.   PHYSICAL EXAMINATION:  Blood pressure is 107/42, pulse is 97,  respiratory rate 18, she is sating 100% on room air.  The patient is  pleasant, alert and oriented x3.  Affect is generally bright and  appropriate.  These have some pitting edema in the left lower extremity  which is still 1+, but overall size of her leg is decreased.  She has  mild pain in the knee with flexion and extension.  She has decreased  sensation in the distal left leg.  Overall, she still remains  overweight.  Cognitively, she is intact and clear.  Heart has regular  rate.  Chest is clear.  Abdomen is soft, nontender.   ASSESSMENT:  1. 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  2. Peripheral neuropathy and neuropathic pain.  3. Rheumatoid arthritis.  4. Obesity.  5. Tricompartmental arthritis, left knee.  6. Bilateral rotator cuff syndrome.   PLAN:  1. Continue with fentanyl patch 50 mcg q.72 h. with Percocet 10 one      q.6 h. p.r.n.  These were refilled today #10 and #120 respectively.  2. We  will stay with 100 mg Lyrica t.i.d.  I increased her Topamax to      100 mg nightly, and we will observe her result.  3. Maintain Flexeril and Cymbalta at current doses.  4. Set the patient up for Hyalgan injections left knee, as she is      intolerant of steroids.  5. I will see her back pending above.      [name], M.D.  Electronically Signed     ZTS/MedQ  D:  10/19/2008 13:45:42  T:  10/20/2008 05:35:38  Job #:  [id number]   cc:   Georgann Housekeeper, MD  Fax: [phone]

## 2010-09-30 NOTE — Assessment & Plan Note (Signed)
Crystal Fitzpatrick is back regarding her phantom pain and stump pain symptoms.  She  has had some problems with edema now in her right leg when hitting the  prosthesis with her recent increase in Lyrica.  She is placed on a  diuretic by her family physician.  She backed off the Lyrica on her own  last week and is at 150 mg twice a day.  She notes possibly some  decrease in edema since then.  She rates her pain at 2-4/10.  She is  having pain in the left knee as well and has a history of arthritis  there.  She has not used her Voltaren gel recently on the leg, however.  Sleep has been poor at times.  Pain interferes with general activity,  relations with others, enjoyment of life on a moderate level.   REVIEW OF SYSTEMS:  Notable for occasional spasms.  She has had some  recent swelling and weight gain due to the swelling.  Mood has been  generally positive.  A full 14-point review of systems in the written  health and history section.   SOCIAL HISTORY:  The patient is move to assisted living apartment.   PHYSICAL EXAMINATION:  Blood pressure is 116/43, pulse is 69,  respiratory rate 18.  She is sating 97% on room air.  The patient is  pleasant, alert and oriented x3.  Affect is bright and appropriate.  Right leg is in her socket and I did not remove today.  She had some  swelling in that leg not pitting.  Left leg is notable for pitting edema  distally at the ankle of 1+ severity.  There is some mild crepitus at  the left knee, but minimal in severity, quite frankly.  Decreased distal  sensation of the left leg.  Heart was regular.  Chest was clear.  Abdomen was soft, nontender.  She remains overweight.  Cognitively, she  is intact.   ASSESSMENT:  1. Right below-the-knee amputation with phantom pain and stump pain.  2. Peripheral neuropathy with neuropathic pain.  3. Rheumatoid arthritis.  4. Obesity.  5. Hypertension.  6. Likely osteoarthritis/patellar tendinitis, left knee.  7. Bilateral  rotator cuff syndrome.   PLAN:  1. Continue fentanyl patch 50 mcg q.72 h and Percocet 10 one q.6 h      p.r.n.  2. Decrease Lyrica to 100 mg t.i.d.  Consider further decrease      depending on her progress.  Could consider moving to another      anticonvulsant depending on how her swelling and pain does going      forward.  3. Continue Flexeril and Cymbalta.  4. We will use Voltaren drops and gel for left knee pain.  We will      send her off for x-rays of the left knee.  Could consider Hyalgan      injections.  5. I will see her back in about a month's time.  I have asked her to      follow up with her PCP regarding the diuretic.  I do not see a lot      of results usually with the diuretic use in the setting of edema      caused by Lyrica.      [name], M.D.  Electronically Signed    ZTS/MedQ  D:  09/12/2008 14:46:25  T:  09/13/2008 02:19:44  Job #:  [id number]   cc:   Georgann Housekeeper, MD  Fax: [phone]

## 2010-09-30 NOTE — Consult Note (Signed)
NAME:  Crystal Fitzpatrick NO.:  [id number]   MEDICAL RECORD NO.:  [id number]          PATIENT TYPE:  INP   LOCATION:  5001                         FACILITY:  [facility]   PHYSICIAN:  Antonietta Breach, M.D.  DATE OF BIRTH:  1945/11/21   DATE OF CONSULTATION:  02/10/2008  DATE OF DISCHARGE:                                 CONSULTATION   REASON FOR CONSULTATION:  Mental status changes, assess capacity,  evaluate and treat psychiatric abnormalities.   HISTORY OF PRESENT ILLNESS:  Crystal Fitzpatrick is a 65 year old female  admitted to the [facility] on January 30, 2008, due to a right  diabetic foot and cellulitis.   Ms. Okray required an amputation of her right foot.  She developed acute  paranoia approximately 3 days ago.  This paranoia did continue while she  was oriented to all spheres.  She also could name the president and the  name of the month.   Yesterday she had bizarre paranoia, she was very guarded, she lost a  significant amount of time with her memory and did not even recollect  her primary care physician visit.   A friend of the patient reported to the medical team that there has been  chronic self-neglect at her home.   When the undersigned visited the patient today she was displaying  anxiety, she did have intact orientation, there were no hallucinations  or delusions, her memory and orientation function were intact, she was  cooperative with the interview.   PAST PSYCHIATRIC HISTORY:  [name] does have a history of feeling on  edge and muscle tension.   In review of the past medical record, in November of 2007 she required  Xanax 1.5 mg daily, however, she does not utilize any benzodiazepines at  home.   FAMILY PSYCHIATRIC HISTORY:  None known.   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  She lives alone.  She is divorced  and has 1 child.  She does not use alcohol or illegal  drugs.   PAST MEDICAL HISTORY:  1. Hypertension.  2. Anemia.  3. Hyponatremia.  4. Diabetes mellitus type 2.   MEDICATIONS:  The MAR is reviewed.  She is on:  1. Percocet one q.4 h p.r.n.  2. Compazine 5-10 mg q.6 h p.r.n.  3. Phenergan 12.5 mg q.3 h p.r.n.  4. B12 1000 mcg every week.  5. Fentanyl IV.  6. Neurontin 100 mg q.i.d.  7. Oxycodone 40 mg b.i.d.  8. Xanax 0.5 mg q.8 h p.r.n.   ALLERGIES:  1. SULFA.  2. CIPROFLOXACIN.  3. ERYTHROMYCIN.  4. NONSTEROIDAL ANTI-INFLAMMATORY DRUGS.  5. COX-2 INHIBITORS.  6. PREDNISONE.  7. LATEX.  8. MORPHINE SULFATE.   EKG QTc on the 20th of September was 442 milliseconds.  Head CT without  contrast no acute abnormalities.  On the 15th of September SGOT 14, SGPT  13.   WBC 6.2, hemoglobin 10.3, platelet count 508.  Sodium 141, BUN 9,  creatinine 0.72.  The patient's B12 was decreased at 163.  Folic acid, TSH and INR all unremarkable.   REVIEW OF SYSTEMS:  CONSTITUTIONAL, HEAD, EYES, EARS, NOSE, THROAT,  MOUTH, NEUROLOGIC, PSYCHIATRIC, CARDIOVASCULAR, RESPIRATORY,  GASTROINTESTINAL, GENITOURINARY, SKIN, MUSCULOSKELETAL, HEMATOLOGIC,  LYMPHATIC, ENDOCRINE, METABOLIC:  All unremarkable.   EXAMINATION:  VITAL SIGNS:  Temperature 99, pulse 72, respiratory rate  20, blood pressure 178/79, O2 saturation on room air 95%.  GENERAL APPEARANCE:  [name] is an elderly female partially reclined  in a supine position in her hospital bed with no abnormal involuntary  movements.   MENTAL STATUS EXAM:  [name] does maintain good eye contact.  Her  attention span is mildly decreased, concentration mildly decreased.  Affect is anxious, mood is mildly anxious.  Her orientation is  completely intact to all spheres.  Memory Function:  3/3 words  immediate, 2/3 words at recall.  Fund of knowledge and intelligence:  Within normal limits.  Speech:  Slightly robotic in nature.  The prosody  is mildly flat, however there is no dysarthria.   Thought process:  Logical, coherent and goal-directed.  No looseness of associations.  Thought Content:  No thoughts of harming herself, no thoughts of harming  others, no delusions or hallucinations.  Insight:  Intact.  Judgment:  Intact.   ASSESSMENT:  AXIS I:  (1)  293.00, delirium not otherwise specified.  This has currently resolved; however, by nature delirium can wax and  wane.  (2)  293.84, anxiety disorder not otherwise specified (likely  general medical factors are of significant etiology with a possible  idiopathic component).  AXIS II:  None.  AXIS III:  See past medical history.  AXIS IV:  General medical.  AXIS V:  55.   [name] is not at risk to harm herself or others.  She agrees to call  Emergency Services immediately for any thoughts of harming herself,  thoughts of harming others or distress.   The undersigned provided ego-supportive psychotherapy and education on  the etiologies of delirium, particularly focusing on her opioid dosing.   The undersigned also discussed the indication, alternatives, adverse  effects of Zyprexa.   The patient understands and would like to proceed as below.   RECOMMENDATION:  1. She is willing to decrease the risk of delirium symptoms by having      her opioid regimen reduced.  2. If psychosis agitation returns, would start Zyprexa 2.5 mg p.o. or      IM daily, Zyprexa is a relatively favorable antipsychotic in the      presence of a QTc of greater than 400 milliseconds.  After starting      the Zyprexa, if it is needed, would recheck the QTc.  3. Would keep memory and orientation cues in the room.   DISCUSSION:  [name] does have a number of factors contributing to  delirium including decreased B12, anemia and her postoperative status  with cytokines and her opioid regimen.      Antonietta Breach, M.D.  Electronically Signed     JW/MEDQ  D:  02/10/2008  T:  02/10/2008  Job:  [id number]

## 2010-09-30 NOTE — Assessment & Plan Note (Signed)
Crystal Fitzpatrick is back regarding her right below-knee amputation and persistent  stump with phantom pain.  I saw her last June and there was substantial  concern for cellulitis involving the left lower extremity.  She had  increased edema in both legs.  I placed her on Keflex 500 mg q.i.d.  She  did not experience substantial benefit.  She follows up with Dr. Raphael Gibney  next week, who had apparently placed her on Levaquin and she had  improvement of the cellulitis, but substantial nausea with the  medication.  She noted over the last week or two that some of the  inflammation recurred again.  She has persistent swelling.  She has  questions about how to manage this swelling.  She is weaned off the  Lyrica and is on Topamax currently 200 mg nightly.  That is helping with  her nerve pain, but not quite to the point where the Lyrica was.  She  rates her pain 5/10, described as burning and aching.  The increased  pain over this period of time has required more breakthrough Percocet.  Sleep is poor as a whole.  Pain seems to be worse at the night and in  the morning.   REVIEW OF SYSTEMS:  Notable for the above.  Full 14-point review is in  the written health and history section.  Other pertinent positives are  above.   SOCIAL HISTORY:  Unchanged.  Her friend is with her today who helps  substantially with her care.   PHYSICAL EXAMINATION:  VITAL SIGNS:  Blood pressure 118/39, pulse is 76,  respiratory rate 18, she is sating 91% on room air.  GENERAL:  The patient is pleasant, alert, and oriented x3.  EXTREMITIES:  The left leg is improved in color with less hyperemia  noted, although distally there is still some redness.  She has 2+ edema  to 3+ edema in both extremities.  Left level lower extremity is dry and  scaly distally as well today.  Area around the calf and foot are  sensitive to touch and range of motion in general.  Area was slightly  warm to touch.  HEART:  Regular.  CHEST:  Clear.  ABDOMEN:  Soft, nontender.  NEURO:  Cognitively, she is intact with fair insight and awareness.   ASSESSMENT:  1. 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  2. Peripheral neuropathy with neuropathic pain.  3. Rheumatoid arthritis.  4. Obesity.  5. Osteoarthritis, left knee.  6. Bilateral rotator cuff syndrome.  7. Cellulitis, left lower extremity.   PLAN:  1. The patient to follow up with Dr. Jonny Ruiz at [facility]      today.  Would resume antibiotic treatment once again, perhaps Cipro      this time.  I did order lower extremity Dopplers to rule out DVT as      this needs to be evaluated.  2. Continue Topamax.  We will increase dose to 300 mg nightly to      further assist with neuropathic pain.  3. We will drop Neurontin to 300 mg t.i.d. for 1 week, then b.i.d.      thereafter to see if this helps with swelling.  4. We will try Nucynta 50 mg q.6 h. p.r.n. in addition to her Percocet      for now.  We will continue with fentanyl 50 mcg patch for baseline      pain control.  5. We will encourage Ace wrap and elevation  in the right lower      extremity.  May need compression garments for legs that control      edema.  6. I will see her back in about a month's time if possible.  We will      maintain Flexeril and Cymbalta current doses as well.      [name], M.D.  Electronically Signed     ZTS/MedQ  D:  12/11/2008 13:18:14  T:  12/12/2008 03:32:23  Job #:  [id number]   cc:   Corwin Levins, MD  [address]  [place]  [place] [address]

## 2010-09-30 NOTE — Group Therapy Note (Signed)
NAME:  Crystal Fitzpatrick, SCHWEPPE NO.:  [id number]   MEDICAL RECORD NO.:  [id number]          PATIENT TYPE:  INP   LOCATION:  5011                         FACILITY:  [facility]   PHYSICIAN:  Peggye Pitt, M.D. DATE OF BIRTH:  25-Nov-1945                                 PROGRESS NOTE   CURRENT MEDICAL PROBLEMS:  1. Right lower extremity ulceration and abscess, status post right BKA      this hospitalization.  2. Hypertension.  3. Type 2 diabetes.  4. Vitamin B12 deficiency.  5. Shortness of breath.  6. Hypokalemia.  7. Nausea and vomiting.  8. History of trigeminal neuralgia.   CURRENT MEDICATIONS:  1. Vitamin B12 1000 mcg daily for 7 days.  To stop on September 23.      After that, she should receive weekly injections for a month and      then monthly thereafter.  2. Lovenox 60 mg subcu nightly for DVT prophylaxis.  3. Fentanyl pump, which is currently placed on hold secondary to      altered mental status/inappropriate behavior at nighttime.  4. Neurontin 100 mg p.o. 4 times daily.  5. Lantus 5 units nightly and sliding-scale insulin.  6. Lopressor 25 mg b.i.d.  7. Benicar 40 mg p.o. daily.  8. Protonix 40 mg q.12h.  9. Senokot.  10.Tylenol p.r.n.  11.Xanax p.r.n.  12.Dilaudid 0.5 to 1 mg q.2h. p.r.n.  13.Zofran and Phenergan p.r.n. nausea.   So far, imaging performed during this hospitalization includes an x-ray  of her right foot on January 31, 2008 that showed a plantar calcaneal  soft tissue defect without definite underlying osseous destruction.  She  had an MRI of her right lower extremity that showed extensive soft  tissue abscesses on the medial aspect of the foot and ankle.  Osteomyelitis involving the calcaneus.  Advanced tibiotalar degenerative  disease.  Myofascitis, most significantly in the short flexor  musculature with intramuscular abscesses.  She also had a right ankle x-  ray on January 31, 2008 that showed a soft tissue defect over the  plantar aspect of the heel without osseous disruption in the lateral  view with a post-traumatic versus neuropathic right ankle degenerative  change.  She had a chest x-ray on February 05, 2008 that showed  cardiomegaly with pulmonary vascular congestion.  Bibasilar infiltrates,  question infection versus edema.  She had a repeat chest x-ray on  February 06, 2008 that showed decreasing edema and bilateral effusions,  compatible with an improving congestive heart failure.  Cardiomegaly.  Residual left basilar air space disease, likely reflecting atelectasis.  This has improved as well.   Patient was seen in consultation by Dr. Annell Greening, orthopedist,  secondary to her right foot/heel ulcer.  She underwent a right below-  knee amputation.   HISTORY AND PHYSICAL:  For full details, please refer to HPI dictated by Dr. Sharon Seller on  January 30, 2008, but in brief, Crystal Fitzpatrick is a 65 year old morbidly  obese diabetic lady who has severe bilateral lower extremity chronic  venous insufficiency.  Her wound care nurse saw her for the above-  mentioned issue.  Because of purulence and a strong odor, she was sent  to the emergency room for further evaluation.  She also had a very  extensive right lower extremity cellulitis up to her knee with erythema  and inflammation.  For that reason, she was started on vancomycin and  Zosyn.   HOSPITAL COURSE BY ACTIVE PROBLEM:  1. Right lower extremity ulcerations and abscess:  She is now status      post a right BKA this hospitalization by Dr. Annell Greening.  She is no      longer on antibiotics.  PT is recommending SNF placement, and this      is pending at this time.  2. Hypertension:  Her BP remains elevated to the 160-180 systolic      range.  I have discontinued her hydrochlorothiazide and have      started her on 20 mg of twice-daily Lasix.  Especially given her      possible CHF.  3. Type 2 diabetes:  Her CBGs have been excellently controlled on 5       units of subcutaneous at bedtime of Lantus and a sliding scale      insulin.  4. Vitamin B12 deficiency, which was diagnosed this hospitalization.      She is on daily IM B12 replacement.  September 23 will be her last      day.  After that, she should have weekly B12 replacement.  5. Nausea and vomiting:  Unsure of the etiology at this time.  I have      ordered an acute abdominal series, which is pending at the time of      this dictation.  6. Hypokalemia, which is likely secondary to the emesis and to Lasix      she received yesterday with large-volume diuresis secondary to      shortness of breath.  I will replete p.o.  Will check a magnesium      level and an acute abdominal series, as above.  7. Shortness of breath:  I wonder if she has CHF, especially with      pulmonary edema, so we will check a 2D echo.  At this time, chest x-      ray shows no evidence of pneumonia.  She does not have a white      count or a fever.   Further medications and problems to be addressed by MD dictating  discharge summary.      Peggye Pitt, M.D.  Electronically Signed     EH/MEDQ  D:  02/07/2008  T:  02/07/2008  Job:  [id number]

## 2010-09-30 NOTE — Assessment & Plan Note (Signed)
Crystal Fitzpatrick is initially scheduled for a Synvisc injection today.  However,  she comes in presenting with increased swelling still in both legs,  particularly the left leg over the last few days.  The leg is becoming  increasingly red.  Pain frankly has not changed a great deal.  She has  not noticed any drainage or wounds to the leg, however.  Does report  swelling in the right leg as well and is still unable to wear her  prosthesis at this point.  We discussed weaning the Lyrica previously,  but she did not want to go down on it due to pain control.  I added  Topamax at her last visit with hope of coming off Lyrica, however.  She  still uses her fentanyl patch for baseline pain and Percocet for  breakthrough symptoms.   Review of systems is notable for the above.  She denies any fever or  chills per se.  She does have some anxiety still.   SOCIAL HISTORY:  Unchanged.   PHYSICAL EXAMINATION:  The patient is pleasant, bit anxious over her  current developments.  Left leg is notable for 2+ to 3+ edema, and skin  is very glossy in the left calf and shin areas.  Leg is reddened from  the pretibial region down to the foot with warmth and hyperemia noted.  She had decreased sensation to pinprick and light touch still on the  left leg regardless.  It was hard to discern pulses due to her swelling  today, but skin was rather warm as noted above.  Cognitively, she was  otherwise intact.  Right leg was in shrinker, swollen but non-  erythematous.  The leg was minimally tender.  Heart was regular.  Chest  was clear.  She remains obese.   ASSESSMENT:  1. 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  2. Peripheral neuropathy with neuropathic pain.  3. Rheumatoid arthritis.  4. Obesity.  5. Osteoarthritis, left knee.  6. Bilateral rotator cuff syndrome.  7. Likely cellulitis, left lower extremity.   PLAN:  1. I discussed her case with Dr. Jonny Fitzpatrick at [facility].  He will  see her tomorrow.  In the interim, I started on Keflex 500 mg      q.i.d.  I asked her to elevate the leg continuously over the next      24 hours.  2. We will drop Lasix to 40 mg b.i.d. for now per Dr. Raphael Fitzpatrick      direction.  3. Consider Dopplers, left lower extremity.  4. We will continue to wean Lyrica down and increase Topamax up in the      meantime.  Plan will be to decrease Lyrica to 100 mg at bedtime for      1 week, then stop.  Increase Topamax to 200 mg at bedtime in the      meantime.  5. Maintain Flexeril and Cymbalta at current doses.  6. Fentanyl patch and Percocet were refilled at 10 and 120      respectively.  7. I will see her back pending the above.      Crystal Fitzpatrick, M.D.  Electronically Signed     ZTS/MedQ  D:  11/14/2008 15:17:55  T:  11/15/2008 04:22:01  Job #:  [id number]   cc:   Crystal Levins, MD  [address]  [place]  [place] [address]

## 2010-09-30 NOTE — Discharge Summary (Signed)
NAMEMarland Fitzpatrick  BRAXTON, WEISBECKER NO.:  [id number]   MEDICAL RECORD NO.:  [id number]          PATIENT TYPE:  INP   LOCATION:  5124                         FACILITY:  [facility]   PHYSICIAN:  Theodosia Paling, MD    DATE OF BIRTH:  09-Sep-1945   DATE OF ADMISSION:  03/06/2008  DATE OF DISCHARGE:  03/07/2008                               DISCHARGE SUMMARY   ADMITTING HISTORY:  Please refer to the primary care Vishwa Dais, Dr.  Georgann Housekeeper, admitting history. Please refer to the history of present  illness dictated by Dr. Virginia Rochester on October 20.   HISTORY OF PRESENT ILLNESS AND HOSPITAL COURSE:  The following issues  were addressed during the hospitalization:  1. Pseudomonas urinary tract infection. The patient's antibiotic was      changed from Primaxin to cefepime twice a day for ease of      administration. She will need to complete 6 more days of IV      antibiotics for her UTI given she was allergic to most of the p.o.      medication alternatives.  2. Diabetes mellitus. The patient does not want to take insulin. She      thinks she can control it with diet and exercise, as it is not type      1 diabetes, I will go ahead and stop Lantus insulin. She can be      continued to be on sliding scale insulin where she can check her      glucose, and primary care physician can manage her diabetes from      there on.  3. Hypertension. The patient's blood pressure was 110/70 without any      antihypertensives here. She is also complaining that in the nursing      home that her blood pressure runs in the 80s and 90s systolic and      50s. At this time, I would go ahead and stop the patient on Lasix      as well as amlodipine. Continue the patient on metoprolol and      Benicar for now. The rest of the medications can be adjusted by      primary care physician as an outpatient.  4. Anxiety and pain management. The patient has neuropathic pain. I      have increased her Lyrica to 75  mg and p.r.n. Dilaudid to 2 mg. She      has a pain management clinic appointment coming up soon where she      can further addressed.   DISCHARGE DIAGNOSES:  1. Pseudomonas urinary tract infection, resistant to Macrodantin.  2. Diabetes mellitus.  3. Hypertension.  4. Neuropathic pain.   DISCHARGE MEDICATIONS:  1. Cefepime 1 gram IV q.12 h. for 6 more days.  2. Lyrica 75 mg p.o. daily.  3. Dilaudid 2 mg p.o. q.6 h. P.r.n.  4. Vitamin B12 1000 mcg subcutaneous daily.  5. Duragesic patch 50 mcg q.72 h.  6. Neurontin 300 mg p.o. q.6 h.  7. Metoprolol 50 mg p.o. q.8 h.  8. Benicar 40 mg p.o.  daily.  9. Protonix 40 mg p.o. daily.  10.K-Dur 20 mEq p.o. q.12 h.  11.Xanax 0.5 mg p.o. q.8 h. as needed.   DISPOSITION:  1. The patient is to follow up with the primary care physician in 1      week's time for further evaluation and management of her diabetes      and high blood pressure.  2. The patient is to keep her appointment with pain clinic for      management of her neuropathic pain.   CONSULTATIONS PERFORMED:  None.   IMAGING PERFORMED:  Chest x-ray done on March 07, 2008, which showed  right PICC line in the lower SVC.   Total time of assessment and discharge of this patient around 40  minutes.      Theodosia Paling, MD  Electronically Signed     NP/MEDQ  D:  03/07/2008  T:  03/07/2008  Job:  [id number]   cc:   Georgann Housekeeper, MD

## 2010-09-30 NOTE — Discharge Summary (Signed)
NAME:  Crystal Fitzpatrick, Crystal Fitzpatrick               ACCOUNT NO.:  [id number]   MEDICAL RECORD NO.:  [id number]          PATIENT TYPE:  OBV   LOCATION:  1403                         FACILITY:  [facility]   PHYSICIAN:  Mobolaji B. Bakare, M.D.DATE OF BIRTH:  1946-03-16   DATE OF ADMISSION:  01/26/2007  DATE OF DISCHARGE:  01/27/2007                               DISCHARGE SUMMARY   PRIMARY CARE PHYSICIAN:  Dr. Lerry Liner in [facility].   FINAL DIAGNOSIS:  1. Hypovolemic hyponatremia.  2. Constipation.  3. Nausea and vomiting.  4. History of trigeminal neuralgia sequel to recent dental extraction.  5. Chronic lower extremity venous edema.  6. Diabetes mellitus.  7. Hypertension.  8. Hypocalcemia, resolved.   PROCEDURES:  1. Abdominal x-ray done on January 26, 2007 showed constipation      small bowel ileus versus potentially partial mechanical small-bowel      obstruction, but severing small-bowel ileus subsegmental      atelectasis versus scarring at the left base.   BRIEF HISTORY:  Ms. __________  is a 65 year old Caucasian lady who  presented with nausea, vomiting, and constipation.  She was noted on  initial blood work, at the emergency room, to have a low sodium of 119.  The patient was asymptomatic.  History revealed that the patient was  recently started on Trileptal for trigeminal neuralgia.  She had  attributed the nausea/vomiting to these pills.  Additionally she has  history of contact with sick neighbor who had vomiting disease.  She was  admitted for treatment and further evaluation.   HOSPITAL COURSE:  Problem #1:  Hypovolemic Hyponatremia.  This was felt  to be secondary to nausea and vomiting and poor p.o. intake which has  been ongoing for about 3 days prior to hospitalization.  Additionally,  the patient was started on Trileptal 2 weeks ago after she complained of  facial pain sequel to multiple dental extractions.  Trileptal could also  call us hyponatremia; hence Trileptal  was discontinued.  The patient was  started on IV fluid normal saline.  Sodium on the morning of discharge  was 123.  The patient remained asymptomatic.  She was discharged later  in the evening after completing another liter of normal saline.  The  patient was happy to go home.  She will have BMET checked on Monday  January 31, 2007.   Problem #2:  CONSTIPATION.  She had nausea, vomiting and some abdominal  discomfort.  She relates constipation not having moved her bowels for  about 3 days prior to hospitalization.  This is quite unusual for her.  The patient has been on chronic narcotic medications.  She was given  Colace, MiraLax and Dulcolax suppositories with good result.  The  patient will continue with Colace 100 mg b.i.d.  In view of the fact  that she is on narcotic analgesics she may use MiraLax p.r.n.   Problem #3:  NAUSEA, VOMITING, AND DIARRHEA.  These resolved with IV  fluid and symptomatic management.  Etiology includes viral versus  secondary to Trileptal which was a new medication for her versus  constipation.  She was afebrile, and had no elevated white cell count.  Urinalysis was unremarkable.   Problem #4:  TRIGEMINAL NEURALGIA.  The patient was started on Neurontin  100 mg b.i.d. and Trileptal was discontinued in view of the side effects  and relationship to her presenting symptoms.   Problem #5:  DIABETES MELLITUS.  Blood glucose was fairly controlled  during the course of hospitalization.  The patient was on clear liquids;  and she was placed on sliding scale insulin.  Upon discharge; she will  resume metformin.   Problem #6:  HYPERTENSION.  This was fairly controlled during the course  of her hospitalization.  The patient was noted to have borderline  bradycardia during the course of hospitalization; hence, metoprolol was  reduced to 25 mg daily and Micardis has been increased to 40 mg b.i.d.   Problem #7:  HYPOCALCEMIA.  It was noted on admission that her  corrected  calcium was 7.74.  A repeat calcium was done after IV fluids and this  was 8.6.   DISCHARGE CONDITION:  Stable.  The patient will be discharged home.   DISCHARGE MEDICATIONS:  1. Roxicodone 30 mg q.4 h. p.r.n.  2. Alprazolam 0.5 mg q.8 h. p.r.n.  3. Metoprolol 25 mg daily.  4. Micardis 40 mg twice a day.  5. Prevacid 1 tablet daily.  6. Metformin 850 mg daily.  7. Colace 100 mg b.i.d.  8. Neurontin 100 mg t.i.d.  9. MiraLax 17 grams daily p.r.n.     The following medications have been discontinued until she sees Dr.  Lerry Liner in 1 week; spironolactone, furosemide, and Trileptal.   RECOMMENDATIONS:  Check BMET on January 31, 2007.   DISCHARGE LABORATORY DATA:  Sodium 120, potassium 4.2, chloride 88, CO2  29, glucose 91, BUN 4, creatinine 0.9, calcium 8.6.      Mobolaji B. Corky Downs, M.D.  Electronically Signed     MBB/MEDQ  D:  01/27/2007  T:  01/28/2007  Job:  [id number]   cc:   [place]  [place] Dr. Lerry Liner

## 2010-09-30 NOTE — H&P (Signed)
NAME:  Crystal Fitzpatrick, Crystal Fitzpatrick NO.:  [id number]   MEDICAL RECORD NO.:  [id number]          PATIENT TYPE:  EMS   LOCATION:  MAJO                         FACILITY:  [facility]   PHYSICIAN:  Hollice Espy, M.D.DATE OF BIRTH:  [birth year]   DATE OF ADMISSION:  03/06/2008  DATE OF DISCHARGE:                              HISTORY & PHYSICAL   PRIMARY CARE Dequon Schnebly:  Dr. Georgann Housekeeper.   CHIEF COMPLAINT:  Urinary tract infection.   HISTORY OF PRESENT ILLNESS:  The patient is a 65 year old white female  with a past medical history of diabetes mellitus, status post a right  BKA, hypertension and psychosis, who is a resident of [facility]  getting rehab when she was noted to have urinary tract infection there  at [facility].  She initially was started on p.o. antibiotics there.  She is known to have some drug allergies.  However, what soon happened  was that the patient's cultures and sensitivities came back for  Pseudomonas resistant to Macrodantin, and the patient has allergies to  CIPRO, SULFA, and ERYTHROMYCIN.  With these findings it was felt likely  that she will have to come in for IV antibiotics.  The patient was sent  directly over to the emergency room.  When I saw the patient, she was  complaining of significant pain.  She says, normally she takes Neurontin  300 mg p.o. t.i.d. and she had only so far received a dose of 100 mg in  the emergency room.  She complains of some pain in her lower extremity  as well as her right stump.  She otherwise appears to relatively well  and appears to be slightly manic, very excitable, and she tells me  otherwise she is doing well.  She denies any headaches, vision changes,  dysphagia, chest pain, palpitations, shortness of breath, wheezing,  coughing abdominal pain.  She noted no dysuria, no hematuria, no back  pain.  Essentially, her review of systems other than her neuropathy, is  otherwise negative.   PAST MEDICAL HISTORY:   Includes diabetes mellitus.  She is status post a  right BKA.  Chronic dysphagia, spinal stenosis, diabetes mellitus,  hypertension, B12 deficiency, history of neuropathy, history of delirium  and psychosis, history of chronic pain and GERD.   MEDICATIONS:  1. She was started on Macrodantin today.  This was before her      sensitivities came back to her urinary tract infection.  2. She is on a fentanyl patch at 50 mcg topically every 72 hours.  3. She is also on Neurontin 100 mg p.o. t.i.d.   The rest of her medications are obtained from a slightly older  medication list on February 15, 2007.  These are as follows:  1. Norvasc 5.  2. Vitamin B12 monthly.  3. Lasix 40 b.i.d.  4. Lantus 5 nightly but the patient says she is not on any Lantus.  5. Metoprolol 50 t.i.d.  6. Benicar 40.  7. Protonix 40.  8. K-Dur 20 b.i.d.  9. Dilaudid 1 q.6 p.r.n.  10.Xanax 0.5 q.8 p.r.n.  11.Artificial tears p.r.n.  ALLERGIES:  CIPRO, NSAID, SULFA, ERYTHROMYCIN.   SOCIAL HISTORY:  No tobacco, alcohol or drug use.   FAMILY HISTORY:  Noncontributory.   PHYSICAL EXAM:  VITAL SIGNS ON ADMISSION TO THE ER:  Temperature 97.7,  heart rate 67, blood pressure 89/55, respirations 18, O2 sat 97% on room  air.  GENERAL:  She is alert and oriented x3.  HEENT:  Normocephalic atraumatic.  Mucous membranes are slightly dry.  She has no carotid bruits.  HEART:  Regular rate and rhythm, S1, S2.  LUNGS:  Clear to auscultation bilaterally.  ABDOMEN:  Soft, obese, nontender, positive bowel sounds.  EXTREMITIES:  No clubbing.  She has trace edema on the left.  She is  status post a right BKA.   LAB WORK:  I-STAT labs show sodium 131, potassium 5.1, chloride 98, BUN  29, creatinine 1.9, glucose 98.   ASSESSMENT AND PLAN:  1. Pseudomonas urinary tract infection.  Will put her on IV Primaxin      and get a PICC line, and treat with IV antibiotics.  Once her PICC      line is placed, she can go to [facility]  on IV antibiotics times      7 days.  2. Diabetes mellitus.  Will check a hemoglobin A1c at this time.  We      will need to confirm her current doses.  3. Psychosis.  Again will continue.  She is on p.r.n. Xanax.      Hollice Espy, M.D.  Electronically Signed     SKK/MEDQ  D:  03/06/2008  T:  03/06/2008  Job:  [id number]   cc:   Georgann Housekeeper, MD

## 2010-09-30 NOTE — Assessment & Plan Note (Signed)
Crystal Fitzpatrick is here regarding her right below-knee amputation and  significant stump phantom pain symptoms.  She is complaining of shoulder  and hip pain specifically in the groin area at this point.  Her  neuropathic pain is substantially improved.  She is having most  specifically shoulder and groin symptoms.  She is still using Dilaudid 4  times a day.  Although, does not really feel that it is doing a whole  lot for her.  She is in her assisted living facility currently.  She  wants to have control over her medications.  She is involved with [facility]  at the outpatient rehab center for prosthetic training and is having  very positive experience there.  Right now, she rates her pain with an  average of 2-7/10.  The pain is burning a bit intermittent aching.  Pain  is generally different as noted above and it was before.  Sleep is fair.  She can walk about 10 minutes without having a stop with her prosthesis  currently.  Her Oswestry score is 40% today.  She has minimal anxiety at  this point.  She has been given nightly Xanax for sleep essentially.   REVIEW OF SYSTEMS:  Notable for the above.  Full 14-point review is in  the health and history section of the chart.   PHYSICAL EXAMINATION:  VITAL SIGNS:  Blood pressure is 125/54, pulse is  80, and respiratory rate 18.  She is sating 94% on room air.  GENERAL:  The patient is pleasant and alert.  Weight appears to be down  a bit.  HEART:  Regular.  CHEST:  Clear.  ABDOMEN:  Soft and nontender.  EXTREMITIES:  She had pain in both rotator cuffs with maneuvers to  impinge the area today.  Strength was intact.  However, the pain was not  substantial.  She was able to move her functional ways without  significant problems.  She continues to have decreased sensation in both  limbs of the lower extremities.  Some distal hand sensory loss as well.  Right leg is in prosthesis today.  Difficult to truly examine the hips  today, but did have some pain  along the proximal quadriceps and the  iliopsoas area.  NEUROLOGIC:  Cognitively, she is intact.  HEART:  Regular.  CHEST:  Clear.   ASSESSMENT:  1. Right below-knee amputation with neuropathic/phantom pain.  2. Peripheral neuropathy.  3. Rheumatoid arthritis.  4. Obesity.  5. Hypertension.  6. Questionable osteoarthritis of the left knee.  7. Muscular tenderness pain of the shoulders and hips likely related      to walker use and prosthetic training.   PLAN:  1. I want to wean off of the Dilaudid.  We will begin that today by      changing over to Percocet 10/325 one q.6 h. p.r.n.  These are to be      used p.r.n. and not on a scheduled basis.  We will obtain to wean      these down going forward.  2. Continue fentanyl patch 50 mcg q.72 h.  The patient prefers the gel      matrix-type.  3. Continue with outpatient physical therapy and she is doing quite      nicely with this.  I told her that I wanted to see her walking into      the office next month.  4. I encouraged a bit more Flexeril use for muscle pain.  She also can  incorporate ice and heat as well stretching which could be learned      through physical therapy.  5. Maintain Lyrica and Cymbalta at current doses.  6. Discouraged and Xanax use.  7. I will see her back in about 1-2 months' time.  I am very happy      with her progress.      [name], M.D.  Electronically Signed     ZTS/MedQ  D:  06/15/2008 13:27:45  T:  06/16/2008 04:52:07  Job #:  [id number]   cc:   Georgann Housekeeper, MD  Fax: [phone]

## 2010-10-03 NOTE — Consult Note (Signed)
NAMEMarland Kitchen  MARIEANNE, MARXEN NO.:  [id number]   MEDICAL RECORD NO.:  [id number]          PATIENT TYPE:  INP   LOCATION:  5001                         FACILITY:  [facility]   PHYSICIAN:  Antonietta Breach, M.D.  DATE OF BIRTH:  Jun 16, 1945   DATE OF CONSULTATION:  02/16/2008  DATE OF DISCHARGE:  02/15/2008                                 CONSULTATION   Crystal Fitzpatrick has required 2 mg of Dilaudid twice and Xanax 0.5 mg since  11:00 p.m. last night.  She has had marked improvement in her  concentration as well as attention.  She continues to display intact  memory as well as orientation.  She does continue to require Xanax  periodically for her feeling on edge and muscle tension.   REVIEW OF SYSTEMS:  NEUROLOGIC:  No slurred speech or drowsiness from  the Xanax.   LABORATORY DATA:  Sodium 138, BUN 10, creatinine 0.86, WBC 5.9.   EXAMINATION:  VITAL SIGNS:  Temperature 98.1, pulse 84, respiratory rate  18, blood pressure 162/86, O2 saturation 96% on 2 liters.   MENTAL STATUS EXAM:  Crystal Fitzpatrick is alert, she is oriented to all spheres.  Her affect is slightly anxious, her attention span is normal.  Mood is  slightly anxious.  Her memory is intact to immediate recent and remote.  Speech within normal limits.  Thought process logical, coherent, goal-  directed, no looseness of associations.  Thought content no thoughts of  harming herself, no thoughts of harming others, no delusions, no  hallucinations.  Insight is intact.  Judgment is intact.   ASSESSMENT:  (1)  293.00 - Delirium not otherwise specified, now  resolved.  (2)  293.84 - Anxiety disorder not otherwise specified.  These are partial symptoms, she does not appear to have a problem with  excessive worry, most likely these are reactive feeling on edge and  muscle tension secondary to pain.   RECOMMENDATION:  Once the patient has reached her environment of  discharge, would try to discontinue the p.r.n. Xanax within 3 weeks  after nursing home placement.   However, if she does continue with any form of chronic anxiety would  reconsult psychiatry.   Crystal Fitzpatrick agrees to call emergency services for any emergency  psychiatric symptoms.   Outpatient psychiatric follow-up is available for confirming her  stability and reassessing any need for long-term medication at one of  the clinics attached to [facility], [facility] or [facility].   The undersigned reinforced education for the patient.   The undersigned will sign off now.  Please call if there are any new  psychiatric concerns.      Antonietta Breach, M.D.  Electronically Signed     JW/MEDQ  D:  02/16/2008  T:  02/16/2008  Job:  [id number]

## 2010-10-03 NOTE — Assessment & Plan Note (Signed)
[facility]                            CARDIOLOGY OFFICE NOTE   Crystal, Fitzpatrick                        MRN:          [id number]  DATE:04/22/2006                            DOB:          04/08/1946    Crystal Fitzpatrick was seen as an outpatient on April 22, 2006, for a chief  complaint of lower extremity edema.   She is a very pleasant 65 year old woman with no prior cardiac history.  She reports a 3-year history of bilateral ankle edema, worse on the  right leg than the left.  She has noted improvement over the last 6  months that coincides with a nearly 60-pound weight loss.  Other than  lower extremity edema, she has very few cardiovascular complaints at  this point.  She does have significant orthopnea.  She uses an  adjustable bed and has to sleep with her head elevated at night.  She  denies PND or exertional dyspnea.  She has no chest pain, palpitations,  lightheadedness, syncope or claudication symptoms.   PAST MEDICAL HISTORY:  1. Foot surgery back in 1980 and 1981.  2. Surgery for a deviated nasal septum in 1983.  3. Recent problems with dysphagia.  4. 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  5. Hypertension.  6. Type 2 diabetes.  7. Obesity.   CURRENT MEDICATIONS:  1. Spironolactone 50 mg daily.  2. Lasix 20 mg daily.  3. Metoprolol 50 mg twice daily.  4. Metformin 250 mg daily.  5. Xanax 1.5 mg daily.  6. Micardis 40 mg daily.  7. Metoclopramide 10 mg four times daily.  8. Prevacid 30 mg twice daily.  9. Ranitidine 15 mg daily.  10.Zofran 8 mg daily.  11.Oxycodone as needed.   She has multiple drug allergies, including ANTI-INFLAMMATORIES,  STEROIDS, SULFA, ERYTHROMYCIN, CIPRO and KEFLEX.   SOCIAL HISTORY:  The patient is single.  She has one child.  She works  as an Scientist, research (physical sciences).  She is beginning an exercise  program and has been walking 1/2 to 3/4 mile daily.  She does not smoke.  She has a history of smoking but quit in 1989.  She does not drink  alcohol.   FAMILY HISTORY:  There is no history of coronary artery disease in the  family.  Her sister is alive and well at age 106.  Her parents both died  from cancer.   REVIEW OF SYSTEMS:  A complete 12-point review of systems was performed.  Pertinent positives include seasonal allergies, asthma, gastroesophageal  reflux disease, arthritis, gout, and anxiety.  All other systems were  reviewed and are negative except as described above.   PHYSICAL EXAMINATION:  GENERAL:  The patient is alert and oriented.  She  is in no acute distress.  She is obese.  VITAL SIGNS:  Her weight is 287 pounds.  Blood pressure 124/77, heart  rate 70, respiratory rate 16.  HEENT:  Normal.  NECK:  Normal carotid upstrokes without bruits.  Jugular venous pressure  is normal.  There is no thyromegaly or thyroid nodules.  LUNGS:  Clear to auscultation bilaterally.  CARDIOVASCULAR:  The apex is discrete and nondisplaced.  The heart is  regular rate and rhythm without murmurs or gallops.  ABDOMEN:  Soft, obese, nontender.  EXTREMITIES:  There is trace edema in the left pretibial region and 1+  edema in the right pretibial region to the ankle.  There are mild  varicosities present.  Peripheral pulses are 2+ and equal throughout.  NEUROLOGIC:  Strength 5/5 in the arms and legs.  Cranial nerves II-XII  are intact.  SKIN:  Warm and dry without rash.   EKG demonstrates a normal sinus rhythm a right IVCD.   ASSESSMENT:  Crystal Fitzpatrick is a 65 year old woman with lower extremity  edema.  She seems to have had significant improvement associated with  her weight loss.  I think the most likely etiology of her edema is  venous insufficiency.  Her only symptom that potentially could be  attributed to congestive heart failure is that of orthopnea.  She  otherwise appears to be asymptomatic from a cardiac standpoint.  I have  recommended obtaining a 2 D  echocardiogram to evaluate for significance  of structural heart disease.  I think that no other testing was  indicated at this point.  We will be in contact with Crystal Fitzpatrick after the  results of her echo are available.  If her echo is normal, we will plan  on seeing her back on an as-needed basis.     Crystal Fitzpatrick. Excell Seltzer, MD  Electronically Signed    MDC/MedQ  DD: 04/22/2006  DT: 04/23/2006  Job #: [id number]   cc:   Nat Christen, M.D.

## 2010-10-03 NOTE — Assessment & Plan Note (Signed)
[facility]                            CARDIOLOGY OFFICE NOTE   ADRIEL, DESROSIER                        MRN:          [id number]  DATE:05/28/2006                            DOB:          11-09-1945    [name] presents today for outpatient follow up to [facility].  She is a very pleasant 65 year old woman with no  prior cardiac problems.  She was evaluated here on April 22, 2006, for  lower extremity edema.  I elected to perform a transthoracic  echocardiogram to rule out any evidence of congestive heart failure as a  potential etiology of her edema and orthopnea.  This was performed  earlier today and has not been officially reported yet.  I reviewed the  echo myself and, to my estimate, it appears that she has normal left  ventricular systolic function as well as normal diastolic parameters.  I  do not appreciate any valvular disease.  Again, the official  interpretation is pending.   Ms. Chaviano continues to do well from a symptomatic standpoint.  She  continues to notice less edema in her legs.  She has had some trouble  with the wraps due to an allergic response but, overall, seems to be  managing quite well.   Her medicines are unchanged and include Spironolactone 50 mg daily,  Metoprolol 50 mg b.i.d., Micardis 40 mg daily, Prevacid 30 mg b.i.d.,  Zofran 8 mg daily, Xanax 0.5 mg 2-3 times daily, and Furosemide 40 mg  daily.   On exam today, her weight is 284 pounds, blood pressure 141/81, heart  rate 87.  Limited exam of her lower extremities demonstrated trace  bilateral edema in the pre-tibial region.   ASSESSMENT:  Ms. Kimm is a 65 year old woman with lower extremity edema  that appears to be unrelated to any cardiac disease.  Her echocardiogram  appeared normal and I will review the official interpretation and make  sure that I have not missed anything.  I think the most likely etiology  of her edema is venous  insufficiency.  I would recommend that she  continue with her current treatment which includes elevation of her legs  and compression stockings as needed.  I would not recommend any further  cardiac testing at this point.   I will plan on seeing Ms. Wygant back on a p.r.n. basis.     Veverly Fells. Excell Seltzer, MD  Electronically Signed    MDC/MedQ  DD: 05/28/2006  DT: 05/29/2006  Job #: [id number]

## 2010-10-03 NOTE — Consult Note (Signed)
NAME:  Crystal Fitzpatrick, MODER NO.:  [id number]   MEDICAL RECORD NO.:  [id number]          PATIENT TYPE:  EMS   LOCATION:  ED                           FACILITY:  [facility]   PHYSICIAN:  Lonia Blood, M.D.DATE OF BIRTH:  06-Apr-1946   DATE OF CONSULTATION:  03/29/2006  DATE OF DISCHARGE:                                 CONSULTATION   PHYSICIAN REQUESTING CONSULTATION:  Lear Ng, MD, emergency room  physician.   PRIMARY CARE PHYSICIAN:  Dr. Mayford Knife at [facility].   REASON FOR CONSULTATION:  Intractable vomiting with dysphagia.   HISTORY OF PRESENT ILLNESS:  [name] is a 65 year old female  with a complex medical history as detailed below.  Beginning on January 14, 2006, she suffered an anaphylactic episode described as tightening  of the throat and inability to breathe.  The exact substance to which  she had an allergic reaction is not clear.  She required inpatient  treatment and improved.  Since that time she reports difficulty with  swallowing.  She reports that she feels that solids stick in her throat.  She has been seen in the emergency room on 2 different occasions for  such.  During one of these occasions she thought that she had a potato  chip stuck in her throat.  Both times she was able to be discharged from  the emergency room.  She has also been under the care of Anselmo Rod, M.D.  She underwent a barium esophagogram in the outpatient  setting, which revealed no evidence of stricture or dysmotility.  Dr.  Loreta Ave proceeded with an EGD on March 19, 2006, at which time a  potential diagnosis of achalasia was made and Botox injections were  administered.  The patient reported very transient symptom improvement  but reports that since that time her symptoms have gradually increased  and have now returned.  For the last 48 hours she reports that  everything she attempts to eat does that is not a pure liquid sticks in  her throat.  She  has had multiple episodes of dry heaving.  She has  complaints of severe gastroesophageal reflux symptoms.  She has been  subsisting on liquids alone, to include SlimFast, Ensure, and yogurt  drinks.  She presented to the emergency room today because of  significant pain she experienced in her esophagus last night, which she  describes as spasms.  She has Xanax at home and reports that this does  improve her symptoms some.  There has been no vomiting of blood.  There  has been no constipation.  There has been no abdominal distention or  significant abdominal pain.  There has been no hematochezia or melena.  At the present time the patient is without recurrent vomiting.  She  reports that as long as she sticks to liquids only, she feels she does  not develop the severe vomiting.  She has been taking all of her other  medications as previously diagnosed.   REVIEW OF SYSTEMS:  The patient has chronic lower extremity swelling,  which has not  been evaluated recently.  She has chronic diffuse body  pain, not just in joints but across her hands and arms, which she  describes as a neuropathy.  She is on chronic OxyContin for this.  She  reports a weight loss since August but is unable to describe exactly how  much weight she has lost.  She has no dizziness.  She has no headaches.  She has no presyncopal spells.  Comprehensive review of systems  otherwise negative with the exception of those positives that have just  been mentioned.   PAST MEDICAL HISTORY:  1. Dysphagia since January 14, 2006.      a.     Status post barium esophagogram without evidence of       stricture of dysmotility.      b.     Status post EGD with Botox injection March 19, 2006, with       questionable appearance of achalasia on exam.  2. CHF versus venous insufficiency with no previous echocardiogram      or evaluation, on chronic diuretic therapy.  3. Hypertension.  4. Diabetes mellitus type 2.  5. Obesity.  6.  Status post bilateral tubal ligation.  7. Deviated septum surgery multiple years ago.  8. Diffuse joint pain with neuropathy.   OUTPATIENT MEDICATIONS:  1. OxyContin 160 mg q.8h.  2. Aldactone 200 mg daily.  3. Xanax 1.5 mg daily.  4. Toprol XL 100 mg daily.  5. Lasix 80 mg b.i.d.  6. Metformin 850 mg daily.  7. AcipHex 20 mg b.i.d.  8. Micardis 80 mg daily.  9. Zyrtec 10 mg daily.   ALLERGIES:  NSAIDs, SULFA, CIPRO, ERYTHROMYCIN and STEROIDS are all  recorded to be allergens.   FAMILY HISTORY:  The patient's mother died of breast cancer at 53.  The  patient's father died of colon cancer at 19.   SOCIAL HISTORY:  The patient does not smoke, does not drink, does not  use illicit drugs.  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   DATA REVIEW:  Hemoglobin is borderline low at 11.3 with an MCV of 89,  platelet count is 290, white count is 9.5.  Sodium is 125.  Potassium,  chloride, bicarb, BUN and creatinine are normal.  Serum glucose is 121.  Calcium is 9.3.  LFTs are normal.  Total bilirubin is normal.  Alkaline  phosphatase is normal.  Total protein and albumin are normal.  Myoglobin  is normal, CK-MB is normal, troponin I is normal.  A 12-lead EKG reveals  sinus bradycardia.   PHYSICAL EXAMINATION:  VITAL SIGNS:  Temperature 98.3, blood pressure  117/54, heart rate 57, respiratory rate 20, O2 saturation is 98% on room  air.  GENERAL:  Obese female in no acute respiratory distress.  HEENT:  Normocephalic, atraumatic.  Pupils equal, round and reactive to  light and accommodation.  Extraocular muscles intact.  OC/OP clear.  NECK:  No JVD.  LUNGS:  Clear to auscultation bilaterally without wheeze or rhonchi.  CARDIOVASCULAR:  Regular rate and rhythm without murmur, gallop, or rub  and normal S1, S2. ABDOMEN:  Obese, soft, bowel sounds present, no hepatosplenomegaly,  nondistended, soft, no organomegaly  appreciable, no rebound.  EXTREMITIES:  1+ bilateral lower extremity edema to above the knees with  some change of the skin consistent with chronic venous stasis.  NEUROLOGIC:  Nonfocal neurologic exam.   RECOMMENDATIONS:  1. Dysphagia.  The patient  has chronic dysphagia.  It is not clear if      this is esophageal spasm.  The patient has been evaluated with a      barium esophagogram and an esophagogastroduodenoscopy and there      does not appear to be stricture, either internal or external.  I am      somewhat concerned about the possibility of a narcotic-induced      gastroparesis.  I have advised the patient that she should decrease      her OxyContin to a maximum of four 80 mg tablets in a day and that      she should decrease further if possible.  I have also placed her on      Reglan on a trial basis at 10 mg q.a.c. and q.h.s.  She is to      follow up with her primary care doctor in 2 days for reevaluation.      It is also recommended that she be referred back to Dr. Loreta Ave in the      outpatient setting for reevaluation.  She reports that she has been      referred to a pain control center, and I have suggested that she      should proceed with this evaluation in an attempt to discontinue      her chronic use of OxyContin.  2. Hyponatremia.  The patient has a mild hyponatremia.  This can be      easily explained by the patient's frequent episodes of vomiting as      well as her daily use of high-dose Lasix and Aldactone.  I have      advised her that she should discontinue Lasix and Aldactone for      now.  I have advised her to follow up with her primary care      physician in 2 days.  The patient has been given normal saline IV      fluid in the ER and along with her discontinuation of Lasix, I      suspect that her hyponatremia will resolve quite quickly.  3. Hypertension.  The patient has a history of hypertension but is      well-controlled on her current medication.  Blood  pressure is      presently 117/54.  In the setting of decreased p.o. intake, I have      advised the patient to decrease her Micardis to 20 mg daily.  This      is a temporary adjustment, and the patient has been advised that      once she is able to take p.o. regularly that she will likely need      to increase her Micardis back to her baseline.  4. Normocytic anemia.  The exact etiology of the patient's anemia is      not clear at this time.  The patient is under the ongoing care of a      gastroenterologist.  In the setting of a colon cancer, I have      advised her that she should schedule a colonoscopy for screening      purposes in the next few months.  She agrees and reports that she      will do this.  5. Family history of breast cancer.  The patient has not had a      mammogram in at least 2 years.  I have advised her that she should  follow up with this and have this scheduled in the setting of a      positive family history of breast cancer.  She voiced understanding      of this and reports that she will do so.  6. Questionable congestive heart failure.  The patient's symptoms are      most likely venous insufficiency due to her obesity.  I would      recommend an outpatient echocardiogram to evaluate her left      ventricular function so that medications can be titrated      appropriately as indicated.   Thank you very much for your consultation on this pleasant patient.  We  will begin her on Reglan, decrease her OxyContin, hold her Lasix and  Aldactone, and decrease her Micardis as discussed above.  I feel that  she is safe for discharge home.  I have advised her to use Xanax at home  at its previously-prescribed dose as needed for severe bouts of  esophageal spasm.  She is aware that should her symptoms persist and she  not be able to maintain p.o. that she should call her primary care  physician or report to the emergency room.      Lonia Blood, M.D.   Electronically Signed     JTM/MEDQ  D:  03/29/2006  T:  03/29/2006  Job:  [id number]   cc:   Dr. Felicita [facility]

## 2010-10-03 NOTE — Op Note (Signed)
NAME:  Crystal Fitzpatrick, Crystal Fitzpatrick NO.:  [id number]   MEDICAL RECORD NO.:  [id number]          PATIENT TYPE:  AMB   LOCATION:  ENDO                         FACILITY:  [facility]   PHYSICIAN:  Anselmo Rod, M.D.  DATE OF BIRTH:  1945-07-06   DATE OF PROCEDURE:  03/19/2006  DATE OF DISCHARGE:                                 OPERATIVE REPORT   PROCEDURE PERFORMED:  Esophagogastroduodenoscopy with Botox injection in the  distal esophagus.   ENDOSCOPIST:  Anselmo Rod, M.D.   INSTRUMENT USED:  Olympus video panendoscope.   INDICATION FOR PROCEDURE:  A 65 year old white female undergoing a EGD for  acute dysphagia.  Barium swallow showed distal esophageal motility with no  evidence of esophageal spasm or stricture.   PREPROCEDURE PREPARATION:  Informed consent was procured from the patient.  The patient was fasted for 4 hours prior to the procedure.  Risks and  benefits of the procedure were discussed with her in great detail.   PREPROCEDURE PHYSICAL:  VITAL SIGNS:  The patient had stable vital signs.  NECK:  Supple.  CHEST:  Clear to auscultation.  CARDIAC:  S1 and S2 regular.  ABDOMEN:  Soft with normal bowel sounds.   DESCRIPTION OF THE PROCEDURE:  The patient was placed in the left lateral  decubitus position and sedated with 100 mcg of fentanyl and 10 mg of Versed  in slow incremental doses.  Once the patient was adequately sedated and  maintained on low-flow oxygen and continuous cardiac monitoring, the Olympus  video panendoscope was advanced through the mouthpiece, over the tongue and  into the esophagus under direct vision.  The esophagus seemed somewhat  dilated with narrowing around the GE junction; this may represent early  achalasia.  Twenty-five units of Botox were injected in each quadrant to see  if this helps the patient's acute dysphagia.  The gastric mucosa appeared  healthy and there was no evidence of a hiatal hernia on high retroflexion.  There was a  large amount of debris in the stomach and multiple washings were  done.  The proximal small bowel was not visualized, as the patient was  somewhat difficult to sedate and did not tolerate intubation of the small  bowel.   IMPRESSION:  1. Dilated esophagus with distal narrowing, ? early achalasia.  2. Normal-appearing gastric mucosa.  3. Proximal small bowel not visualized.  4. Twenty-five units of Botox injected in each quadrant.   RECOMMENDATIONS:  A telephone update will be done within the next week and  further recommendations made in followup.      Anselmo Rod, M.D.  Electronically Signed     JNM/MEDQ  D:  03/19/2006  T:  03/20/2006  Job:  [id number]   cc:   Lacretia Leigh. Quintella Reichert, M.D.  Freda Munro

## 2010-10-07 ENCOUNTER — Encounter: Payer: Medicare Other | Attending: Neurosurgery | Admitting: Neurosurgery

## 2010-10-07 DIAGNOSIS — M25569 Pain in unspecified knee: Secondary | ICD-10-CM

## 2010-10-07 DIAGNOSIS — G609 Hereditary and idiopathic neuropathy, unspecified: Secondary | ICD-10-CM | POA: Insufficient documentation

## 2010-10-07 DIAGNOSIS — M1990 Unspecified osteoarthritis, unspecified site: Secondary | ICD-10-CM | POA: Insufficient documentation

## 2010-10-07 DIAGNOSIS — L089 Local infection of the skin and subcutaneous tissue, unspecified: Secondary | ICD-10-CM | POA: Insufficient documentation

## 2010-10-07 DIAGNOSIS — M069 Rheumatoid arthritis, unspecified: Secondary | ICD-10-CM | POA: Insufficient documentation

## 2010-10-07 DIAGNOSIS — F341 Dysthymic disorder: Secondary | ICD-10-CM | POA: Insufficient documentation

## 2010-10-07 DIAGNOSIS — G894 Chronic pain syndrome: Secondary | ICD-10-CM

## 2010-10-07 DIAGNOSIS — G547 Phantom limb syndrome without pain: Secondary | ICD-10-CM | POA: Insufficient documentation

## 2010-10-08 NOTE — Assessment & Plan Note (Signed)
This is a patient of Dr. Riley Kill, seen for multiple problems.  She is also is seeing Dr. Eula Flax for coping with stress and anxiety.  She states that her right leg with her below-the-knee amputation is going to a strengthening process.  They did have a shrinking cup around her leg on this stump now to try to manage to the left lower leg; however, she did incur the left lower extremity infection after the person who does her toenails made a mistake according to the patient. She had an infection that has moved upper leg.  Her primary care doctor does have [facility] working with her and doing wraps and treatments to her left lower extremity, which she states is better.  She rates her pain at about 6 as sharp and intermittent.  REVIEW OF SYSTEMS:  Notable for those difficulties as well as nausea and diarrhea and some limb swelling.  Otherwise, within normal limits.  PAST MEDICAL HISTORY:  Unchanged.  SOCIAL HISTORY:  Unchanged.  PHYSICAL EXAMINATION:  VITAL SIGNS:  Blood pressure 125/69, pulse 88, respirations 20, O2 sats 94% on room air. EXTREMITIES:  Her quad strength is good and her lower extremities bilaterally.  Her sensation is somewhat diminished in the left lower extremity.  Both extremities are wrapped at this point.  She is alert and oriented x3.  Constitutionally, she is somewhat obese.  She is in a wheelchair.  She is starting to work out.  She wants to get back toward the pool if she gets this infection healed first.  She can extend her left leg to 14 degrees, flex to about 90 degrees, and states that somewhat of an improvement since her last visit with Dr. Riley Kill.  ASSESSMENT: 1. Right below-the-knee amputation with phantom pain. 2. Left lower extremity infection due to a problem with having her     nails clipped. 3. Peripheral neuropathy. 4. Rheumatoid arthritis. 5. Anxiety/depression. 6. Osteoarthritis.  PLAN: 1. She is doing well with her  counseling as well as her therapy.  She     will continue that hopefully after she gets her leg healed and she     will be able to get back into the pool. 2. We gave her refills on fentanyl patch 75 mcg one transdermally 72,     I gave her 10 with no refill of Percocet 10/325 one p.o. q.4-6 h.     130 with no refill. 3. She will follow up in the clinic in 1 month.  Her questions were     encouraged and answered.     Kashvi Prevette L. Blima Dessert Electronically Signed    RLW/MedQ D:  10/07/2010 11:52:13  T:  10/08/2010 02:11:53  Job #:  [id number]

## 2010-10-09 DIAGNOSIS — F411 Generalized anxiety disorder: Secondary | ICD-10-CM

## 2010-10-24 ENCOUNTER — Encounter: Payer: Self-pay | Admitting: Internal Medicine

## 2010-10-24 ENCOUNTER — Ambulatory Visit (INDEPENDENT_AMBULATORY_CARE_PROVIDER_SITE_OTHER): Payer: Medicare Other | Admitting: Internal Medicine

## 2010-10-24 DIAGNOSIS — R569 Unspecified convulsions: Secondary | ICD-10-CM

## 2010-10-24 DIAGNOSIS — J309 Allergic rhinitis, unspecified: Secondary | ICD-10-CM

## 2010-10-24 DIAGNOSIS — I10 Essential (primary) hypertension: Secondary | ICD-10-CM

## 2010-10-24 DIAGNOSIS — R197 Diarrhea, unspecified: Secondary | ICD-10-CM

## 2010-10-24 MED ORDER — CETIRIZINE HCL 10 MG PO TABS: 10.0000 mg | ORAL_TABLET | Freq: Every day | ORAL | Status: DC

## 2010-10-24 NOTE — Patient Instructions (Addendum)
Take all new medications as prescribed - the generic zyrtec Continue all other medications as before We will try to obtain information from [facility] regarding the most recent Hospitalization Ok to stay off the lipitor for now You should try the metamucil for the diarrhea to start Please return in November 2012 as planned, or sooner if needed

## 2010-10-24 NOTE — Progress Notes (Signed)
Subjective:    Patient ID: Crystal Fitzpatrick, female    DOB: 20-Jul-1945, 65 y.o.   MRN: [id number]  HPI  Here after recent hospn at [facility] (no records avail), had witnessed siezure in the ER, required intubation; pt does not recall events prior being found unconscious before taken by ems to hosp;  That evening had a nice time at a party with some dancing type movements; mentions she usually takes otc benadryl in the spring, and more this yr than usual due to work about her apartment that kicked up dust;  Pt not sure of cause of siezure, family not sure as well as providers did not tell them, and was not treated with anti-epileptic.  Had MRI of head (and ct head) but o/w details not clear.  Pt lives alone, family/friends concerned whether this might occur again.  No longer taking the proventil, lipitor since d/c. No further symptoms since d/c  Incidentally also with 2-3 days significant diarrhea, loose stool wihtout abd pain, n/v, fever, blood, wt loss, dysphaiga, vomiting. Has ongoing pain need due to phantom pain RLE, on fentanyl, with ? Some suggestion of withdrawal? With last hospn (family not sure). Immodium has helped for the loose stool so far.  Pt denies chest pain, increased sob or doe, wheezing, orthopnea, PND, increased LE swelling, palpitations, dizziness or syncope.  Pt denies new neurological symptoms such as new headache, or facial or extremity weakness or numbness   Pt denies polydipsia, polyuria.  Past Medical History  Diagnosis Date  . DIABETES MELLITUS, TYPE II 07/17/2008  . B12 DEFICIENCY 07/17/2008  . Acute gouty arthropathy 12/11/2008  . HYPONATREMIA 03/20/2010  . HYPERKALEMIA 10/31/2009  . ANEMIA-NOS 07/17/2008  . ANXIETY 07/17/2008  . Chronic pain syndrome 02/14/2010  . Trigeminal neuralgia 02/14/2010  . HEARING LOSS, RIGHT EAR 06/04/2009  . HYPERTENSION 07/17/2008  . CHF 01/28/2009  . Pneumonia, organism unspecified 02/14/2010  . GERD 07/17/2008  . IBS 06/04/2009  . UNSPECIFIED HEPATITIS  01/28/2009  . RENAL INSUFFICIENCY 10/31/2009  . MENOPAUSAL DISORDER 12/03/2009  . Cellulitis and abscess of leg, except foot 11/15/2008  . SKIN ULCER, CHRONIC 02/08/2009  . Rheumatoid arthritis 07/17/2008  . SKIN LESION 06/04/2009  . ARTHRITIS 01/28/2009  . FOOT PAIN 07/17/2008  . HYPERSOMNIA 02/14/2010  . Altered mental status 02/07/2010  . PERIPHERAL EDEMA 08/30/2008  . Wheezing 12/03/2009  . ABDOMINAL DISTENSION 10/11/2008  . Dysuria 11/11/2009  . Hypoxemia 03/20/2010  . ANAPHYLACTIC SHOCK 05/15/2009  . NEUROPATHY, HX OF 01/28/2009  . COLONIC POLYPS, HX OF 07/17/2008  . AMPUTATION, BELOW KNEE, RIGHT, HX OF 01/28/2009  . Hyperlipemia 09/24/2010  . Hyperlipidemia 09/24/2010   Past Surgical History  Procedure Date  . Amputation 02/02/2008    below right knee  . S/p egd 2007    with Botox for? Achalasia  . Tonsillectomy   . S/p breast biopsy 1992  . 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 Tubal ligation   . Nasal septum surgery   . Foot surgury 1980 and 1981    reports that she has quit smoking. She does not have any smokeless tobacco history on file. She reports that she does not drink alcohol or use illicit drugs. family history includes Breast cancer in her sister; Diabetes in her other; and Stroke in her other. Allergies  Allergen Reactions  . Ciprofloxacin   . Erythromycin   . Morphine   . Nitrofurantoin     REACTION: itch  . Sulfonamide Derivatives  Current Outpatient Prescriptions on File Prior to Visit  Medication Sig Dispense Refill  . ALPRAZolam (XANAX) 0.25 MG tablet Take 0.25 mg by mouth 3 (three) times daily as needed.        . Ascorbic Acid (VITAMIN C) 500 MG tablet Take 500 mg by mouth daily.        . Cholecalciferol (VITAMIN D) 400 UNITS capsule Take 400 Units by mouth daily.        . DULoxetine (CYMBALTA) 60 MG capsule Take 60 mg by mouth 2 (two) times daily.        . fentaNYL (DURAGESIC - DOSED MCG/HR) 75 MCG/HR Place 1 patch onto the skin. 1 patch every 3 days         . furosemide (LASIX) 80 MG tablet Take 1 tablet (80 mg total) by mouth daily.  90 tablet  3  . gabapentin (NEURONTIN) 300 MG capsule Take 1 capsule (300 mg total) by mouth 3 (three) times daily. 1 by mouth in the AM, 1/2 by mouth in the afternoon, then 2 by mouth in the PM.      . lidocaine (LIDODERM) 5 % Place 1 patch onto the skin. Remove & Discard patch within 12 hours or as directed by MD       . Multiple Vitamins-Minerals (CENTRUM SILVER ULTRA WOMENS PO) Take by mouth daily.        [name] olmesartan (BENICAR) 40 MG tablet Take 40 mg by mouth daily.        . Omega-3 Fatty Acids (FISH OIL) 1000 MG CAPS Take by mouth daily.        [name] oxyCODONE-acetaminophen (PERCOCET) 10-325 MG per tablet Take 1 tablet by mouth every 6 (six) hours as needed.        . pantoprazole (PROTONIX) 40 MG tablet Take 40 mg by mouth daily.        . promethazine (PHENERGAN) 25 MG tablet Take 25 mg by mouth every 6 (six) hours as needed.        . Psyllium (METAMUCIL) 30.9 % POWD Take by mouth. Take as directed       . topiramate (TOPAMAX) 100 MG tablet Take 100 mg by mouth 3 (three) times daily.        [name] albuterol (PROAIR HFA) 108 (90 BASE) MCG/ACT inhaler Inhale 2 puffs into the lungs every 6 (six) hours as needed.        [name] atorvastatin (LIPITOR) 10 MG tablet Take 1 tablet (10 mg total) by mouth daily.  90 tablet  3  . diphenoxylate-atropine (LOMOTIL) 2.5-0.025 MG per tablet Take 1 tablet by mouth 2 (two) times daily as needed.  30 tablet  2  . doxycycline (MONODOX) 100 MG capsule Take 1 capsule (100 mg total) by mouth 2 (two) times daily.  20 capsule  0  . loperamide (ANTI-DIARRHEAL) 2 MG tablet Take 2 mg by mouth 2 (two) times daily as needed.        . neomycin-colistin-hydrocortisone-thonzonium (CORTISPORIN-TC) 3.07-18-08-0.5 MG/ML otic suspension Place 2 drops into the right ear 4 (four) times daily.  10 mL  1   Review of Systems All otherwise neg per pt     Objective:   Physical Exam BP 110/60  Pulse 88  Temp(Src)  98.8 F (37.1 C) (Oral)  Ht 5\' 10"  (1.778 m)  Wt 254 lb (115.214 kg)  BMI 36.45 kg/m2  SpO2 98% Physical Exam  VS noted Constitutional: Pt appears well-developed and well-nourished.  HENT: Head: Normocephalic.  Right Ear: External  ear normal.  Left Ear: External ear normal.  Eyes: Conjunctivae and EOM are normal. Pupils are equal, round, and reactive to light.  Neck: Normal range of motion. Neck supple.  Cardiovascular: Normal rate and regular rhythm.   Pulmonary/Chest: Effort normal and breath sounds normal.  Abd:  Soft, NT, non-distended, + BS Neurological: Pt is alert. No cranial nerve deficit.  Skin: Skin is warm. No erythema.  Psychiatric: Pt behavior is normal. Thought content normal.  S/p right BKA       Assessment & Plan:

## 2010-10-24 NOTE — Assessment & Plan Note (Signed)
Pt never started the statin so far, ok to hold for now given the recent illness, too upsetting for pt

## 2010-10-26 ENCOUNTER — Encounter: Payer: Self-pay | Admitting: Internal Medicine

## 2010-10-26 DIAGNOSIS — R569 Unspecified convulsions: Secondary | ICD-10-CM

## 2010-10-26 DIAGNOSIS — R197 Diarrhea, unspecified: Secondary | ICD-10-CM | POA: Insufficient documentation

## 2010-10-26 HISTORY — DX: Unspecified convulsions: R56.9

## 2010-10-26 NOTE — Assessment & Plan Note (Signed)
Exam benign, not clear if had recent antibx, no abd pain or fever, for metamucil prn to start,  to f/u any worsening symptoms or concerns

## 2010-10-26 NOTE — Assessment & Plan Note (Signed)
Unclear etiology as pt and family not sure of details, will need to obtain prior records;  Pt states no further sz activity, and was not referred post d/c for neurology f/u, to cont same tx for now

## 2010-10-26 NOTE — Assessment & Plan Note (Signed)
Mild uncontrolled, to re-start meds as before

## 2010-10-26 NOTE — Assessment & Plan Note (Signed)
stable overall by hx and exam, most recent data reviewed with pt, and pt to continue medical treatment as before  BP Readings from Last 3 Encounters:  10/24/10 110/60  09/24/10 100/72  08/14/10 132/80

## 2010-10-31 ENCOUNTER — Telehealth: Payer: Self-pay | Admitting: *Deleted

## 2010-10-31 DIAGNOSIS — S88119A Complete traumatic amputation at level between knee and ankle, unspecified lower leg, initial encounter: Secondary | ICD-10-CM

## 2010-10-31 MED ORDER — ONETOUCH ULTRA SYSTEM W/DEVICE KIT: 1.0000 | PACK | Freq: Once | Status: DC

## 2010-10-31 MED ORDER — GLUCOSE BLOOD VI STRP: ORAL_STRIP | Status: DC

## 2010-10-31 MED ORDER — ONETOUCH DELICA LANCETS MISC: Status: AC

## 2010-10-31 NOTE — Telephone Encounter (Signed)
Wendy-HHRN from [place] called regarding pt-she is having trouble with her automated glucometer being complicated to use-she is requesting that new rx for pt be sent for simpler glucometer and supplies to [facility]

## 2010-10-31 NOTE — Telephone Encounter (Signed)
Rx for new glucometer and supplies sent to [facility]-pt informed.

## 2010-11-03 ENCOUNTER — Other Ambulatory Visit: Payer: Self-pay | Admitting: Internal Medicine

## 2010-11-03 ENCOUNTER — Telehealth: Payer: Self-pay

## 2010-11-03 NOTE — Telephone Encounter (Signed)
Ok for verbal 

## 2010-11-03 NOTE — Telephone Encounter (Signed)
Social worker advised via secure VM

## 2010-11-03 NOTE — Telephone Encounter (Signed)
Social worker called requesting verbal okay for additional visits with pt, verbal okay?

## 2010-11-06 ENCOUNTER — Encounter: Payer: Medicare Other | Attending: Physical Medicine & Rehabilitation

## 2010-11-06 ENCOUNTER — Telehealth: Payer: Self-pay

## 2010-11-06 DIAGNOSIS — G547 Phantom limb syndrome without pain: Secondary | ICD-10-CM | POA: Insufficient documentation

## 2010-11-06 DIAGNOSIS — M1990 Unspecified osteoarthritis, unspecified site: Secondary | ICD-10-CM | POA: Insufficient documentation

## 2010-11-06 DIAGNOSIS — F341 Dysthymic disorder: Secondary | ICD-10-CM

## 2010-11-06 DIAGNOSIS — S88119A Complete traumatic amputation at level between knee and ankle, unspecified lower leg, initial encounter: Secondary | ICD-10-CM | POA: Insufficient documentation

## 2010-11-06 DIAGNOSIS — G609 Hereditary and idiopathic neuropathy, unspecified: Secondary | ICD-10-CM

## 2010-11-06 DIAGNOSIS — M869 Osteomyelitis, unspecified: Secondary | ICD-10-CM

## 2010-11-06 DIAGNOSIS — E669 Obesity, unspecified: Secondary | ICD-10-CM

## 2010-11-06 DIAGNOSIS — M069 Rheumatoid arthritis, unspecified: Secondary | ICD-10-CM | POA: Insufficient documentation

## 2010-11-06 NOTE — Telephone Encounter (Signed)
Pt advised.

## 2010-11-06 NOTE — Telephone Encounter (Signed)
Yes, diagnosis was febrile siezure, no further eval or tx needed

## 2010-11-06 NOTE — Telephone Encounter (Signed)
Pt called to inquire whether her records from [facility] were received and reviewed by MD. Pt states that per last OV MD would review notes and advise if Neuro consult was necessary. Please advise.

## 2010-11-10 DIAGNOSIS — E119 Type 2 diabetes mellitus without complications: Secondary | ICD-10-CM

## 2010-11-10 DIAGNOSIS — I509 Heart failure, unspecified: Secondary | ICD-10-CM

## 2010-11-10 DIAGNOSIS — G40909 Epilepsy, unspecified, not intractable, without status epilepticus: Secondary | ICD-10-CM

## 2010-11-10 DIAGNOSIS — R269 Unspecified abnormalities of gait and mobility: Secondary | ICD-10-CM

## 2010-11-12 ENCOUNTER — Telehealth: Payer: Self-pay | Admitting: *Deleted

## 2010-11-12 NOTE — Telephone Encounter (Signed)
Due to confusion, fever, and low blood pressure - to go to ER asap

## 2010-11-12 NOTE — Telephone Encounter (Signed)
[facility] called regarding pt. Pt was recently d/c from hospital because of febrile seizure. HHRN is concerned that pt may being having sxs or may have another seizure soon. Pt was confused today and her temperature today is 99.2, pulse 92, and BP 84/54. Caregiver also sates that pt's arms have been jerking slightly today. HHRN is asking for MD's advisement on whether pt needs an OV or needs to go to ER-please advise.

## 2010-11-12 NOTE — Telephone Encounter (Signed)
HHRN advised and will inform pt and caregiver

## 2010-11-25 ENCOUNTER — Telehealth: Payer: Self-pay

## 2010-11-25 NOTE — Telephone Encounter (Signed)
ok 

## 2010-11-25 NOTE — Telephone Encounter (Signed)
Crystal Fitzpatrick called to advise MD that they have resumed home care for this pt post hospitalization.

## 2010-11-27 ENCOUNTER — Other Ambulatory Visit: Payer: Self-pay | Admitting: *Deleted

## 2010-11-27 MED ORDER — ONETOUCH ULTRA 2 W/DEVICE KIT: 1.0000 | PACK | Freq: Once | Status: AC

## 2010-11-27 NOTE — Telephone Encounter (Signed)
HHRN from [place] called-pt needs rx for glucometer faxed to [facility].

## 2010-12-04 ENCOUNTER — Telehealth: Payer: Self-pay

## 2010-12-04 ENCOUNTER — Telehealth: Payer: Self-pay | Admitting: *Deleted

## 2010-12-04 NOTE — Telephone Encounter (Signed)
Vernona Rieger RN called to inform patient had nausea, diarrhea Sunday and Monday (no fever), has improved. Concern for now is the patients left leg normally measures 15" around calf is now measuring 19" is red, warm and painful patient stated changed overnight. Please advise. Contact phone number for Vernona Rieger is [phone] and [phone].

## 2010-12-04 NOTE — Telephone Encounter (Signed)
If no fever, should go to ER now to r/o DVT

## 2010-12-04 NOTE — Telephone Encounter (Signed)
Called Vernona Rieger informed of MD's instructions.

## 2010-12-04 NOTE — Telephone Encounter (Signed)
FYI: Patient called to inform you that them swelling in her leg has gone down almost an inch, the color looks better, and she is in no pain. Pt states she will keep OV on Monday 12/08/10 and go to ED if necessary before then.

## 2010-12-04 NOTE — Telephone Encounter (Signed)
noted 

## 2010-12-08 ENCOUNTER — Encounter: Payer: Self-pay | Admitting: Internal Medicine

## 2010-12-08 ENCOUNTER — Other Ambulatory Visit: Payer: Self-pay | Admitting: Internal Medicine

## 2010-12-08 ENCOUNTER — Encounter: Payer: Medicare Other | Admitting: Physical Medicine & Rehabilitation

## 2010-12-08 ENCOUNTER — Ambulatory Visit (INDEPENDENT_AMBULATORY_CARE_PROVIDER_SITE_OTHER): Payer: Medicare Other | Admitting: Internal Medicine

## 2010-12-08 ENCOUNTER — Other Ambulatory Visit (INDEPENDENT_AMBULATORY_CARE_PROVIDER_SITE_OTHER): Payer: Medicare Other

## 2010-12-08 VITALS — BP 120/58 | HR 89 | Temp 100.1°F | Ht 70.0 in | Wt 257.0 lb

## 2010-12-08 DIAGNOSIS — L02419 Cutaneous abscess of limb, unspecified: Secondary | ICD-10-CM

## 2010-12-08 DIAGNOSIS — L03119 Cellulitis of unspecified part of limb: Secondary | ICD-10-CM | POA: Insufficient documentation

## 2010-12-08 DIAGNOSIS — L03116 Cellulitis of left lower limb: Secondary | ICD-10-CM

## 2010-12-08 DIAGNOSIS — G547 Phantom limb syndrome without pain: Secondary | ICD-10-CM | POA: Insufficient documentation

## 2010-12-08 DIAGNOSIS — E119 Type 2 diabetes mellitus without complications: Secondary | ICD-10-CM

## 2010-12-08 DIAGNOSIS — L98499 Non-pressure chronic ulcer of skin of other sites with unspecified severity: Secondary | ICD-10-CM

## 2010-12-08 DIAGNOSIS — M069 Rheumatoid arthritis, unspecified: Secondary | ICD-10-CM | POA: Insufficient documentation

## 2010-12-08 DIAGNOSIS — I509 Heart failure, unspecified: Secondary | ICD-10-CM

## 2010-12-08 DIAGNOSIS — I10 Essential (primary) hypertension: Secondary | ICD-10-CM

## 2010-12-08 DIAGNOSIS — R609 Edema, unspecified: Secondary | ICD-10-CM | POA: Insufficient documentation

## 2010-12-08 DIAGNOSIS — G609 Hereditary and idiopathic neuropathy, unspecified: Secondary | ICD-10-CM | POA: Insufficient documentation

## 2010-12-08 DIAGNOSIS — M1710 Unilateral primary osteoarthritis, unspecified knee: Secondary | ICD-10-CM | POA: Insufficient documentation

## 2010-12-08 LAB — CBC WITH DIFFERENTIAL/PLATELET
Basophils Relative: 0.3 % (ref 0.0–3.0)
Eosinophils Relative: 7.3 % — ABNORMAL HIGH (ref 0.0–5.0)
HCT: 30.9 % — ABNORMAL LOW (ref 36.0–46.0)
Hemoglobin: 10.5 g/dL — ABNORMAL LOW (ref 12.0–15.0)
Lymphs Abs: 1.7 10*3/uL (ref 0.7–4.0)
MCV: 91.8 fl (ref 78.0–100.0)
Monocytes Absolute: 0.5 10*3/uL (ref 0.1–1.0)
Monocytes Relative: 9.2 % (ref 3.0–12.0)
Neutro Abs: 3.2 10*3/uL (ref 1.4–7.7)
Platelets: 237 10*3/uL (ref 150.0–400.0)
WBC: 5.9 10*3/uL (ref 4.5–10.5)

## 2010-12-08 LAB — LDL CHOLESTEROL, DIRECT: Direct LDL: 128.5 mg/dL

## 2010-12-08 LAB — BASIC METABOLIC PANEL
BUN: 20 mg/dL (ref 6–23)
CO2: 27 mEq/L (ref 19–32)
Calcium: 8.7 mg/dL (ref 8.4–10.5)
Creatinine, Ser: 1.3 mg/dL — ABNORMAL HIGH (ref 0.4–1.2)
GFR: 44.22 mL/min — ABNORMAL LOW (ref >60.00)
Glucose, Bld: 106 mg/dL — ABNORMAL HIGH (ref 70–99)

## 2010-12-08 LAB — HEMOGLOBIN A1C: Hgb A1c MFr Bld: 6.3 % (ref 4.6–6.5)

## 2010-12-08 LAB — LIPID PANEL: HDL: 40.1 mg/dL (ref >39.00)

## 2010-12-08 LAB — BRAIN NATRIURETIC PEPTIDE: Pro B Natriuretic peptide (BNP): 72 pg/mL (ref 0.0–100.0)

## 2010-12-08 MED ORDER — CEPHALEXIN 500 MG PO CAPS: 500.0000 mg | ORAL_CAPSULE | Freq: Four times a day (QID) | ORAL | Status: AC

## 2010-12-08 NOTE — Assessment & Plan Note (Signed)
stable overall by hx and exam, most recent data reviewed with pt, and pt to continue medical treatment as before  Lab Results  Component Value Date   HGBA1C 6.5 08/14/2010

## 2010-12-08 NOTE — Assessment & Plan Note (Signed)
unfort intolerant now of sulfa and doxy;  For cephalexin course, but if not improved, will need cleocin, avoid for now as pt fearful of more diarrhea

## 2010-12-08 NOTE — Assessment & Plan Note (Signed)
stable overall by hx and exam, most recent data reviewed with pt, and pt to continue medical treatment as before  BP Readings from Last 3 Encounters:  12/08/10 120/58  10/24/10 110/60  09/24/10 100/72

## 2010-12-08 NOTE — Patient Instructions (Signed)
Take all new medications as prescribed Continue all other medications as before Please go to LAB in the Basement for the blood and/or urine tests to be done today Please call the phone number [phone] (the PhoneTree System) for results of testing in 2-3 days;  When calling, simply dial the number, and when prompted enter the MRN number above (the Medical Record Number) and the # key, then the message should start. Please return in 6 months, or sooner if needed 

## 2010-12-08 NOTE — Progress Notes (Signed)
Subjective:    Patient ID: Crystal Fitzpatrick, female    DOB: Aug 04, 1945, 65 y.o.   MRN: [id number]  HPI  Here to f/u;  Has low grade temp today of which she was unaware; does not fell ill, toxic, confused as before;  Does have mild increased distal LLE redness with warmth, but only minimally more tender if at all, and states the swelling in the last few days has actually improved by 1/2 inch (measred with tape measure).  No chills, abd pain, and Pt denies chest pain, increased sob or doe, wheezing, orthopnea, PND, increased LE swelling, palpitations, dizziness or syncope.  Does have also an ongoing left buttock sore for 6 wks after a skin tear episode at home, and did not mention it when last hospd at [facility] due to embarrassment.  Small, but ongoing and wants to be looked at today, but no increased pain,red, swelling or drainage.   Past Medical History  Diagnosis Date  . DIABETES MELLITUS, TYPE II 07/17/2008  . B12 DEFICIENCY 07/17/2008  . Acute gouty arthropathy 12/11/2008  . HYPONATREMIA 03/20/2010  . HYPERKALEMIA 10/31/2009  . ANEMIA-NOS 07/17/2008  . ANXIETY 07/17/2008  . Chronic pain syndrome 02/14/2010  . Trigeminal neuralgia 02/14/2010  . HEARING LOSS, RIGHT EAR 06/04/2009  . HYPERTENSION 07/17/2008  . CHF 01/28/2009  . Pneumonia, organism unspecified 02/14/2010  . GERD 07/17/2008  . IBS 06/04/2009  . UNSPECIFIED HEPATITIS 01/28/2009  . RENAL INSUFFICIENCY 10/31/2009  . MENOPAUSAL DISORDER 12/03/2009  . Cellulitis and abscess of leg, except foot 11/15/2008  . SKIN ULCER, CHRONIC 02/08/2009  . Rheumatoid arthritis 07/17/2008  . SKIN LESION 06/04/2009  . ARTHRITIS 01/28/2009  . FOOT PAIN 07/17/2008  . HYPERSOMNIA 02/14/2010  . Altered mental status 02/07/2010  . PERIPHERAL EDEMA 08/30/2008  . Wheezing 12/03/2009  . ABDOMINAL DISTENSION 10/11/2008  . Dysuria 11/11/2009  . Hypoxemia 03/20/2010  . ANAPHYLACTIC SHOCK 05/15/2009  . NEUROPATHY, HX OF 01/28/2009  . COLONIC POLYPS, HX OF 07/17/2008  . AMPUTATION, BELOW  KNEE, RIGHT, HX OF 01/28/2009  . Hyperlipemia 09/24/2010  . Hyperlipidemia 09/24/2010  . Seizure 10/26/2010   Past Surgical History  Procedure Date  . Amputation 02/02/2008    below right knee  . S/p egd 2007    with Botox for? Achalasia  . Tonsillectomy   . S/p breast biopsy 1992  . 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 Tubal ligation   . Nasal septum surgery   . Foot surgury 1980 and 1981    reports that she has quit smoking. She does not have any smokeless tobacco history on file. She reports that she does not drink alcohol or use illicit drugs. family history includes Breast cancer in her sister; Diabetes in her other; and Stroke in her other. Allergies  Allergen Reactions  . Ciprofloxacin   . Erythromycin   . Morphine   . Nitrofurantoin     REACTION: itch  . Sulfonamide Derivatives    Current Outpatient Prescriptions on File Prior to Visit  Medication Sig Dispense Refill  . albuterol (PROAIR HFA) 108 (90 BASE) MCG/ACT inhaler Inhale 2 puffs into the lungs every 6 (six) hours as needed.        . ALPRAZolam (XANAX) 0.25 MG tablet Take 0.25 mg by mouth 3 (three) times daily as needed.        . Ascorbic Acid (VITAMIN C) 500 MG tablet Take 500 mg by mouth daily.        . Blood  Glucose Monitoring Suppl (ONE TOUCH ULTRA 2) W/DEVICE KIT 1 Device by Does not apply route once.  1 each  0  . Blood Glucose Monitoring Suppl (ONE TOUCH ULTRA SYSTEM KIT) W/DEVICE KIT 1 kit by Does not apply route once.  1 each  0  . Cholecalciferol (VITAMIN D) 400 UNITS capsule Take 400 Units by mouth daily.        . diphenoxylate-atropine (LOMOTIL) 2.5-0.025 MG per tablet Take 1 tablet by mouth 2 (two) times daily as needed.  30 tablet  2  . DULoxetine (CYMBALTA) 60 MG capsule Take 60 mg by mouth 2 (two) times daily.        . fentaNYL (DURAGESIC - DOSED MCG/HR) 75 MCG/HR Place 1 patch onto the skin. 1 patch every 3 days       . gabapentin (NEURONTIN) 300 MG capsule Take 1 capsule (300 mg total) by  mouth 3 (three) times daily. 1 by mouth in the AM, 1/2 by mouth in the afternoon, then 2 by mouth in the PM.      . glucose blood (ONE TOUCH ULTRA TEST) test strip Use to test blood sugar two times daily dx 250.00  100 each  2  . lidocaine (LIDODERM) 5 % Place 1 patch onto the skin. Remove & Discard patch within 12 hours or as directed by MD       . loperamide (ANTI-DIARRHEAL) 2 MG tablet Take 2 mg by mouth 2 (two) times daily as needed.        . Multiple Vitamins-Minerals (CENTRUM SILVER ULTRA WOMENS PO) Take by mouth daily.        [name] olmesartan (BENICAR) 40 MG tablet Take 40 mg by mouth daily.        . Omega-3 Fatty Acids (FISH OIL) 1000 MG CAPS Take by mouth daily.        Letta Pate DELICA LANCETS MISC Use as directed to test blood sugar dx 250.00  100 each  2  . oxyCODONE-acetaminophen (PERCOCET) 10-325 MG per tablet Take 1 tablet by mouth every 6 (six) hours as needed.        . pantoprazole (PROTONIX) 40 MG tablet Take 40 mg by mouth daily.        . promethazine (PHENERGAN) 25 MG tablet TAKE ONE (1) TABLET BY MOUTH EVERY 6    HOURS AS NEEDED FOR NAUSEA & VOMITTING                                          Generic for PHENERGA  40 tablet  1  . Psyllium (METAMUCIL) 30.9 % POWD Take by mouth. Take as directed       . topiramate (TOPAMAX) 100 MG tablet Take 100 mg by mouth 3 (three) times daily.        . cetirizine (ZYRTEC) 10 MG tablet Take 1 tablet (10 mg total) by mouth daily.  30 tablet  11  . furosemide (LASIX) 80 MG tablet Take 1 tablet (80 mg total) by mouth daily.  90 tablet  3   Review of Systems Review of Systems  Constitutional: Negative for diaphoresis and unexpected weight change.  HENT: Negative for drooling and tinnitus.   Eyes: Negative for photophobia and visual disturbance.  Respiratory: Negative for choking and stridor.   Gastrointestinal: Negative for vomiting and blood in stool.  Genitourinary: Negative for hematuria and decreased urine volume.  Objective:    Physical Exam BP 120/58  Pulse 89  Temp(Src) 100.1 F (37.8 C) (Oral)  Ht 5\' 10"  (1.778 m)  Wt 257 lb (116.574 kg)  BMI 36.88 kg/m2  SpO2 95% Physical Exam  VS noted Constitutional: Pt appears well-developed and well-nourished.  HENT: Head: Normocephalic.  Right Ear: External ear normal.  Left Ear: External ear normal.  Eyes: Conjunctivae and EOM are normal. Pupils are equal, round, and reactive to light.  Neck: Normal range of motion. Neck supple.  Cardiovascular: Normal rate and regular rhythm.   Pulmonary/Chest: Effort normal and breath sounds normal.  Abd:  Soft, NT, non-distended, + BS Neurological: Pt is alert. No cranial nerve deficit.  Skin: Skin is warm.  LLE with distal 2+ edema, warmth, redness anteriorly without weeping, ulcers or drainage, no red streaks;  Left buttock with small superfic ulceration chronic without red, tender, swelling or drainage Psychiatric: Pt behavior is normal. Thought content normal. not confused or ill appearing        Assessment & Plan:

## 2010-12-08 NOTE — Assessment & Plan Note (Signed)
Minor skin tear left post thigh, for neosporin and 4x4 gauze topical prn

## 2010-12-08 NOTE — Assessment & Plan Note (Signed)
stable overall by hx and exam, most recent data reviewed with pt, and pt to continue medical treatment as before  To check bnp

## 2010-12-10 ENCOUNTER — Encounter: Payer: Medicare Other | Attending: Neurosurgery | Admitting: Physical Medicine & Rehabilitation

## 2010-12-10 DIAGNOSIS — F341 Dysthymic disorder: Secondary | ICD-10-CM | POA: Insufficient documentation

## 2010-12-10 DIAGNOSIS — G609 Hereditary and idiopathic neuropathy, unspecified: Secondary | ICD-10-CM | POA: Insufficient documentation

## 2010-12-10 DIAGNOSIS — G547 Phantom limb syndrome without pain: Secondary | ICD-10-CM | POA: Insufficient documentation

## 2010-12-10 DIAGNOSIS — L089 Local infection of the skin and subcutaneous tissue, unspecified: Secondary | ICD-10-CM | POA: Insufficient documentation

## 2010-12-10 DIAGNOSIS — S88119A Complete traumatic amputation at level between knee and ankle, unspecified lower leg, initial encounter: Secondary | ICD-10-CM

## 2010-12-10 DIAGNOSIS — M1990 Unspecified osteoarthritis, unspecified site: Secondary | ICD-10-CM | POA: Insufficient documentation

## 2010-12-10 DIAGNOSIS — M869 Osteomyelitis, unspecified: Secondary | ICD-10-CM

## 2010-12-10 DIAGNOSIS — M069 Rheumatoid arthritis, unspecified: Secondary | ICD-10-CM | POA: Insufficient documentation

## 2010-12-10 NOTE — Assessment & Plan Note (Signed)
Crystal Fitzpatrick is back regarding her multiple pain complaints.  Unfortunately, she was admitted to the hospital with a cellulitis that is the cause of significant fevers and seizure ultimately.  She was treated for this and discharged.  She was put on antibiotics recently by Dr. Jonny Ruiz again for cellulitis.  She is receiving Home Health therapies through [facility] and has had some wraps in the past, but not currently.  Pain is about 4- 5/10.  Her current regimen seems to be working well for her.  Sleep is fair.  REVIEW OF SYSTEMS:  Notable for numbness and tremor.  Full 12-point review is in the written health and history section of the chart. Cognitively, she is doing quite well at this point.  SOCIAL HISTORY:  The patient is single, living alone.  PHYSICAL EXAMINATION:  VITAL SIGNS:  Blood pressure is 142/74, pulse 80, respiratory rate 20 and she is sating 94% on room air. GENERAL:  The patient is pleasant, alert and oriented x3.  Affect is generally bright and appropriate. EXTREMITIES:  Left leg is edematous still and red distally.  Area is really not warm.  There are no signs of breakdown.  She has some pain still in the right residual limb with swelling noted there as well. Area appears to be at baseline, however. NEUROLOGIC:  Cognitively, she is alert and appropriate.  Upper extremity strength is intact. HEART:  Regular. CHEST:  Clear. ABDOMEN:  Soft and nontender.  ASSESSMENT: 1. 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 2. Peripheral neuropathy and neuropathic pain. 3. History of rheumatoid arthritis. 4. Osteoarthritis of the left knee. 5. Recurrent cellulitis, left leg.  This is associated with her edema.  PLAN: 1. I think she will be a candidate for Unna dressings for the left     leg.  We should continue with these to help reduce the size of this     limb and ultimately put her in some custom compression stockings.     The patient asked about increasing her  diuretics and we discussed     the pros and cons of that.  Given that she was admitted last year     for dehydration, I really do not think that is wise at this point.     I will check on her lab work done through Dr. Raphael Gibney office. 2. I refilled her fentanyl patches and Percocet today.  She is doing     well overall with her pain regimen.  Once we have some stability     with her leg situation, we will get her back over to outpatient     therapy to work on prosthetic     training. 3. We will see her back here in about 2-3 weeks to check up on her     left leg cellulitis and edema.     [name], M.D. Electronically Signed    ZTS/MedQ D:  12/10/2010 11:43:27  T:  12/10/2010 13:04:16  Job #:  [id number]  cc:   Corwin Levins, MD [address] [place] [address]

## 2010-12-11 ENCOUNTER — Telehealth: Payer: Self-pay

## 2010-12-11 NOTE — Telephone Encounter (Signed)
Laurel from [facility] called this morning to inform she saw the patient yesterday and she had filled her antibiotic but had not started. Laurel encouraged her she needed to start the medication. Also the patient informed her that you were getting her a referral to Dr. Hermelinda Medicus? Did you want the RN to wrap the leg if so they would need an order, please advise. [phone].

## 2010-12-11 NOTE — Telephone Encounter (Signed)
Informed Laurel with [name] of information.

## 2010-12-11 NOTE — Telephone Encounter (Signed)
Ok to wrap the leg

## 2010-12-11 NOTE — Telephone Encounter (Signed)
Ok to start the medication  There was not intended referral from last visit to Dr Hermelinda Medicus

## 2010-12-17 ENCOUNTER — Telehealth: Payer: Self-pay

## 2010-12-17 NOTE — Telephone Encounter (Signed)
Di Kindle called back stating that pt is requesting a calamine-zinc wrapping for her leg rather that "straight" wrap. HHRN will take verbal.

## 2010-12-17 NOTE — Telephone Encounter (Signed)
Ok for verbal 

## 2010-12-17 NOTE — Telephone Encounter (Signed)
Di Kindle called requesting a call back form Robin CMA for [name].

## 2010-12-18 NOTE — Telephone Encounter (Signed)
Laurel informed.

## 2010-12-22 ENCOUNTER — Telehealth: Payer: Self-pay

## 2010-12-22 NOTE — Telephone Encounter (Signed)
Should be ok for now, if no increasing chills, redness, swelling, tenderness

## 2010-12-22 NOTE — Telephone Encounter (Signed)
Pt called stating she has completed Keflex course but is still having low grade temperature during the night. Pt is requesting refill of ABX, please advise.

## 2010-12-23 ENCOUNTER — Telehealth: Payer: Self-pay | Admitting: *Deleted

## 2010-12-23 MED ORDER — CEPHALEXIN 500 MG PO CAPS: 500.0000 mg | ORAL_CAPSULE | Freq: Four times a day (QID) | ORAL | Status: AC

## 2010-12-23 NOTE — Telephone Encounter (Signed)
[facility] states that pt had temp of 99.2 today and that pt is is requesting another course of ATB-please advise

## 2010-12-23 NOTE — Telephone Encounter (Signed)
Done per emr 

## 2010-12-23 NOTE — Telephone Encounter (Signed)
Pt advised of MD's recommendation 

## 2010-12-23 NOTE — Telephone Encounter (Signed)
[facility] notified

## 2010-12-26 ENCOUNTER — Encounter: Payer: Medicare Other | Attending: Neurosurgery | Admitting: Neurosurgery

## 2010-12-26 DIAGNOSIS — G547 Phantom limb syndrome without pain: Secondary | ICD-10-CM | POA: Insufficient documentation

## 2010-12-26 DIAGNOSIS — F341 Dysthymic disorder: Secondary | ICD-10-CM | POA: Insufficient documentation

## 2010-12-26 DIAGNOSIS — G609 Hereditary and idiopathic neuropathy, unspecified: Secondary | ICD-10-CM | POA: Insufficient documentation

## 2010-12-26 DIAGNOSIS — M069 Rheumatoid arthritis, unspecified: Secondary | ICD-10-CM | POA: Insufficient documentation

## 2010-12-26 DIAGNOSIS — M1710 Unilateral primary osteoarthritis, unspecified knee: Secondary | ICD-10-CM

## 2010-12-26 DIAGNOSIS — M1990 Unspecified osteoarthritis, unspecified site: Secondary | ICD-10-CM | POA: Insufficient documentation

## 2010-12-26 DIAGNOSIS — L089 Local infection of the skin and subcutaneous tissue, unspecified: Secondary | ICD-10-CM | POA: Insufficient documentation

## 2010-12-26 NOTE — Assessment & Plan Note (Signed)
Account [id number].  This is a patient of Dr. Riley Kill seen for multiple pain complaints.  She has cellulitis in the left lower extremity.  She does have a Unna wrap there.  She is being followed by [facility] care for that and will follow up with Dr. Riley Kill in a month for recheck, so far she is doing well with that.  She was seen today with her health care power of attorney.  She is followed by Dr. Jonny Ruiz for the antibiotics for her leg. She rates her pain at about 2 to 5.  She states is much better today. Pain is dull and aching.  Activity level is 4-5.  Pain is worse in the morning and night.  Sleep patterns are fair.  Pain is worse with activity.  Rest, therapy, and medication tend to help.  Mobility:  She does climb steps.  She does not drive.  She cannot walk very far at all. Functionality:  She is not working.  REVIEW OF SYSTEMS:  Notable for difficulties described above, otherwise within normal limits.  No aberrant behaviors.  No suicidal thoughts.  PAST MEDICAL HISTORY, SOCIAL HISTORY AND FAMILY HISTORY:  Unchanged.  PHYSICAL EXAMINATION:  VITAL SIGNS:  Her blood pressure is 104/40, pulse 86, respirations 16, O2 sats 93 on room air.  Her quadriceps strength is good in her affected leg, the amputated leg is mobile but sore.  Constitutionally, she is obese.  She is alert and oriented x3.  She is in a wheelchair for her gait.  ASSESSMENT: 1. Right below-the-knee amputation with phantom pain. 2. Peripheral neuropathy. 3. History of RA 4. Osteoarthritis of the left knee. 5. Recurrence cellulitis of the left leg.  PLAN: 1. She does not need refills today.  She will continue her current     medication regimen as prescribed. 2. She will follow up with Dr. Riley Kill in 1 month.  Questions were     encouraged and answered. 3. She will continue to follow up with Dr. Melvyn Novas office in [place]     regarding her wound.  They may give compression hose after her     dressings are dc'd.   Her questions again were encouraged and     answered.     Crystal Fitzpatrick L. Blima Dessert Electronically Signed    RLW/MedQ D:  12/26/2010 14:26:25  T:  12/26/2010 22:34:19  Job #:  [id number]

## 2011-01-05 DIAGNOSIS — L03119 Cellulitis of unspecified part of limb: Secondary | ICD-10-CM

## 2011-01-05 DIAGNOSIS — I509 Heart failure, unspecified: Secondary | ICD-10-CM

## 2011-01-05 DIAGNOSIS — L02419 Cutaneous abscess of limb, unspecified: Secondary | ICD-10-CM

## 2011-01-05 DIAGNOSIS — G40909 Epilepsy, unspecified, not intractable, without status epilepticus: Secondary | ICD-10-CM

## 2011-01-05 DIAGNOSIS — E119 Type 2 diabetes mellitus without complications: Secondary | ICD-10-CM

## 2011-01-13 ENCOUNTER — Telehealth: Payer: Self-pay

## 2011-01-13 NOTE — Telephone Encounter (Signed)
Crystal Fitzpatrick is requesting order from MD to discontinue LT leg wraps per pt request. [name] states that there are no open wounds.

## 2011-01-13 NOTE — Telephone Encounter (Signed)
Ok for verbal 

## 2011-01-13 NOTE — Telephone Encounter (Signed)
[name] advised via VM

## 2011-01-15 ENCOUNTER — Encounter: Payer: Medicare Other | Admitting: Neurosurgery

## 2011-01-15 DIAGNOSIS — M1710 Unilateral primary osteoarthritis, unspecified knee: Secondary | ICD-10-CM

## 2011-01-15 DIAGNOSIS — G547 Phantom limb syndrome without pain: Secondary | ICD-10-CM

## 2011-01-15 DIAGNOSIS — G609 Hereditary and idiopathic neuropathy, unspecified: Secondary | ICD-10-CM

## 2011-01-15 NOTE — Assessment & Plan Note (Signed)
This is a patient of Dr. Riley Kill seen for multiple pain complaints.  She has cellulitis in left leg and between her primary care doctor and Dr. Riley Kill, she had been taking care of by [facility] for Unna wraps.  Her primary care Dr. Dr. Yetta Barre is following her up tomorrow. She has an appointment today so she can come out of the wrap and she has no cellulitis, no edema there.  I did examine her.  She rates her pain is 6.  It is a sharp.  It is stabbing, aching pain.  Otherwise, she is unchanged in her exam.  She does have some spasms, tremors.  PAST MEDICAL HISTORY:  Unchanged.  SOCIAL HISTORY:  Unchanged.  FAMILY HISTORY:  Unchanged.  PHYSICAL EXAMINATION:  Blood pressure 105/44, pulse 90, respirations 18, O2 sats 91% on room air.  Her motor strength is 5/5 in the left lower extremity.  Right below-the-knee stump appears to have healed.  She does have a compression stocking on it.  Constitutionally, she is obese.  She is alert and oriented x3.  She is in a wheelchair.  She is here with her granddaughter.  Left lower extremity edema is better.  We are going to discontinue the [employer].  ASSESSMENT: 1. Right below-the-knee amputation phantom pain. 2. Peripheral neuropathy. 3. History of rheumatoid arthritis. 4. Osteoarthritis, left knee. 5. Healed cellulitis, left leg.  PLAN:  Start compression stocking to the left lower extremity.  She will get ordered for those at [facility] to bring this out to her house tomorrow.  She will follow up with Dr. Yetta Barre tomorrow to see if he agrees with the time frame on the compression hose.  She already has an appointment with Dr. Riley Kill in 1 month.  Her questions were encouraged and answered.     Russell L. Blima Dessert Electronically Signed    RLW/MedQ D:  01/15/2011 11:13:19  T:  01/15/2011 12:08:16  Job #:  [id number]

## 2011-01-16 ENCOUNTER — Ambulatory Visit: Payer: Medicare Other | Admitting: Internal Medicine

## 2011-01-21 ENCOUNTER — Ambulatory Visit: Payer: Medicare Other | Admitting: Internal Medicine

## 2011-01-28 ENCOUNTER — Telehealth: Payer: Self-pay

## 2011-01-28 ENCOUNTER — Other Ambulatory Visit: Payer: Self-pay | Admitting: Internal Medicine

## 2011-01-28 MED ORDER — DIPHENOXYLATE-ATROPINE 2.5-0.025 MG PO TABS: 1.0000 | ORAL_TABLET | Freq: Two times a day (BID) | ORAL | Status: DC | PRN

## 2011-01-28 NOTE — Telephone Encounter (Signed)
[name] with [name] called to inform the patient is being fitted for compression socks. They are requesting a verbal ok to resume leg wraps at this time. Call back number [phone]

## 2011-01-28 NOTE — Telephone Encounter (Signed)
Crystal Fitzpatrick with [name] called to have verbal ok to resume leg wrap. The patient is getting fitted for compression socks and they are requesting the leg wraps to resume back. Call back number to [facility] is [phone].

## 2011-01-28 NOTE — Telephone Encounter (Signed)
Ok for verbal 

## 2011-01-28 NOTE — Telephone Encounter (Signed)
Faxed hardcopy to pharmacy. 

## 2011-01-29 ENCOUNTER — Encounter: Payer: Self-pay | Admitting: Internal Medicine

## 2011-01-29 ENCOUNTER — Ambulatory Visit (INDEPENDENT_AMBULATORY_CARE_PROVIDER_SITE_OTHER): Payer: Medicare Other | Admitting: Internal Medicine

## 2011-01-29 VITALS — BP 110/68 | HR 85 | Temp 98.0°F

## 2011-01-29 DIAGNOSIS — L03119 Cellulitis of unspecified part of limb: Secondary | ICD-10-CM

## 2011-01-29 DIAGNOSIS — I5189 Other ill-defined heart diseases: Secondary | ICD-10-CM | POA: Insufficient documentation

## 2011-01-29 DIAGNOSIS — D649 Anemia, unspecified: Secondary | ICD-10-CM

## 2011-01-29 DIAGNOSIS — M129 Arthropathy, unspecified: Secondary | ICD-10-CM

## 2011-01-29 DIAGNOSIS — R609 Edema, unspecified: Secondary | ICD-10-CM

## 2011-01-29 DIAGNOSIS — L03116 Cellulitis of left lower limb: Secondary | ICD-10-CM

## 2011-01-29 DIAGNOSIS — Z23 Encounter for immunization: Secondary | ICD-10-CM

## 2011-01-29 MED ORDER — DOXYCYCLINE HYCLATE 100 MG PO TABS: 100.0000 mg | ORAL_TABLET | Freq: Two times a day (BID) | ORAL | Status: DC

## 2011-01-29 NOTE — Assessment & Plan Note (Signed)
stable overall by hx and exam, most recent data reviewed with pt, and pt to continue medical treatment as before  Lab Results  Component Value Date   HGB 10.5* 12/08/2010

## 2011-01-29 NOTE — Patient Instructions (Addendum)
Take all new medications as prescribed - the antibiotic I agree with the left leg fitted compression stocking Continue all other medications as before You will be contacted regarding the referral for: Rheumatology

## 2011-01-29 NOTE — Assessment & Plan Note (Signed)
?   RA vs other - for rheum referral as she has not been able to do so to date

## 2011-01-29 NOTE — Progress Notes (Signed)
Addended by: Scharlene Gloss B on: 01/29/2011 10:57 AM   Modules accepted: Orders

## 2011-01-29 NOTE — Progress Notes (Signed)
Subjective:    Patient ID: Crystal Fitzpatrick, female    DOB: November 05, 1945, 65 y.o.   MRN: [id number]  HPI  Here to f/u;  Has spent about 2 mo mostly in bed, trying to keep the legs elevated, which did seem to help as the measured diameter of the leg went down to 15 in at the 6 in below knee level;  Iin the past 2 days unfort has rapidly increased to the 19 in diam at the same level;  Has been tx for cellulitis in the past and thinks may be having a low grade temp but no high fever, chills;  Has been re-trying to use the compression stockings as well which was simply unable due to pain with the neuropathy and severe difficulty with getting it on; knows now she needs to consider a fitted stocking rather than an off the shelf, also biotech needs to update the shrinker apparatus for the RLE - BKA;  Called per [name] to re-start wraps, has a rx per Dr Hermelinda Medicus to go to [facility] to get the fitted LLE compression stocking - earliest would be next tues.  Temp yest 99.3 per [facility] and rec'd to come here.   Denies urinary symptoms such as dysuria, frequency, urgency,or hematuria.  No ST, significant cough except for minor prob related to allergies, better with 25 mg benadryl OTC this am.  Pt denies chest pain, increased sob or doe, wheezing, orthopnea, PND, increased LE swelling, palpitations, dizziness or syncope, except for the above.  Last labs done July 23 essentially stable.  No overt bleeding or bruising.  Pt also concerned about the + ANA last yr, has never b een able to establish with rheum; c/o pain not only with neuropathy but states also "has pain all over."  Remembers she was concerned for Lupus a few yrs ago with elev ESR as well.   Past Medical History  Diagnosis Date  . DIABETES MELLITUS, TYPE II 07/17/2008  . B12 DEFICIENCY 07/17/2008  . Acute gouty arthropathy 12/11/2008  . HYPONATREMIA 03/20/2010  . HYPERKALEMIA 10/31/2009  . ANEMIA-NOS 07/17/2008  . ANXIETY 07/17/2008  . Chronic pain syndrome  02/14/2010  . Trigeminal neuralgia 02/14/2010  . HEARING LOSS, RIGHT EAR 06/04/2009  . HYPERTENSION 07/17/2008  . CHF 01/28/2009  . Pneumonia, organism unspecified 02/14/2010  . GERD 07/17/2008  . IBS 06/04/2009  . UNSPECIFIED HEPATITIS 01/28/2009  . RENAL INSUFFICIENCY 10/31/2009  . MENOPAUSAL DISORDER 12/03/2009  . Cellulitis and abscess of leg, except foot 11/15/2008  . SKIN ULCER, CHRONIC 02/08/2009  . Rheumatoid arthritis 07/17/2008  . SKIN LESION 06/04/2009  . ARTHRITIS 01/28/2009  . FOOT PAIN 07/17/2008  . HYPERSOMNIA 02/14/2010  . Altered mental status 02/07/2010  . PERIPHERAL EDEMA 08/30/2008  . Wheezing 12/03/2009  . ABDOMINAL DISTENSION 10/11/2008  . Dysuria 11/11/2009  . Hypoxemia 03/20/2010  . ANAPHYLACTIC SHOCK 05/15/2009  . NEUROPATHY, HX OF 01/28/2009  . COLONIC POLYPS, HX OF 07/17/2008  . AMPUTATION, BELOW KNEE, RIGHT, HX OF 01/28/2009  . Hyperlipemia 09/24/2010  . Hyperlipidemia 09/24/2010  . Seizure 10/26/2010   Past Surgical History  Procedure Date  . Amputation 02/02/2008    below right knee  . S/p egd 2007    with Botox for? Achalasia  . Tonsillectomy   . S/p breast biopsy 1992  . 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 Tubal ligation   . Nasal septum surgery   . Foot surgury 1980 and 1981    reports  that she has quit smoking. She does not have any smokeless tobacco history on file. She reports that she does not drink alcohol or use illicit drugs. family history includes Breast cancer in her sister; Diabetes in her other; and Stroke in her other. Allergies  Allergen Reactions  . Ciprofloxacin   . Doxycycline     Nausea, diarrhea  . Erythromycin   . Morphine   . Nitrofurantoin     REACTION: itch  . Sulfonamide Derivatives    Current Outpatient Prescriptions on File Prior to Visit  Medication Sig Dispense Refill  . albuterol (PROAIR HFA) 108 (90 BASE) MCG/ACT inhaler Inhale 2 puffs into the lungs every 6 (six) hours as needed.        . ALPRAZolam (XANAX) 0.25 MG tablet  Take 0.25 mg by mouth 3 (three) times daily as needed.        . Ascorbic Acid (VITAMIN C) 500 MG tablet Take 500 mg by mouth daily.        . Blood Glucose Monitoring Suppl (ONE TOUCH ULTRA 2) W/DEVICE KIT 1 Device by Does not apply route once.  1 each  0  . Cholecalciferol (VITAMIN D) 400 UNITS capsule Take 400 Units by mouth daily.        . diphenoxylate-atropine (LOMOTIL) 2.5-0.025 MG per tablet Take 1 tablet by mouth 2 (two) times daily as needed.  30 tablet  2  . DULoxetine (CYMBALTA) 60 MG capsule Take 60 mg by mouth 2 (two) times daily.        . fentaNYL (DURAGESIC - DOSED MCG/HR) 75 MCG/HR Place 1 patch onto the skin. 1 patch every 3 days       . furosemide (LASIX) 40 MG tablet Take 40 mg by mouth 2 (two) times daily.        [name] gabapentin (NEURONTIN) 300 MG capsule Take 1 capsule (300 mg total) by mouth 3 (three) times daily. 1 by mouth in the AM, 1/2 by mouth in the afternoon, then 2 by mouth in the PM.      . glucose blood (ONE TOUCH ULTRA TEST) test strip Use to test blood sugar two times daily dx 250.00  100 each  2  . lidocaine (LIDODERM) 5 % Place 1 patch onto the skin. Remove & Discard patch within 12 hours or as directed by MD       . loperamide (ANTI-DIARRHEAL) 2 MG tablet Take 2 mg by mouth 2 (two) times daily as needed.        . Multiple Vitamins-Minerals (CENTRUM SILVER ULTRA WOMENS PO) Take by mouth daily.        [name] olmesartan (BENICAR) 40 MG tablet Take 40 mg by mouth daily.        . Omega-3 Fatty Acids (FISH OIL) 1000 MG CAPS Take by mouth daily.        Letta Pate DELICA LANCETS MISC Use as directed to test blood sugar dx 250.00  100 each  2  . oxyCODONE-acetaminophen (PERCOCET) 10-325 MG per tablet Take 1 tablet by mouth every 6 (six) hours as needed.        . pantoprazole (PROTONIX) 40 MG tablet Take 40 mg by mouth daily.        . promethazine (PHENERGAN) 25 MG tablet TAKE ONE TABLET BY MOUTH EVERY 6 HOURS  AS NEEDED NAUSEA AND VOMITTING  40 tablet  1  . Psyllium (METAMUCIL)  30.9 % POWD Take by mouth. Take as directed       . topiramate (  TOPAMAX) 100 MG tablet Take 100 mg by mouth 3 (three) times daily.        . Blood Glucose Monitoring Suppl (ONE TOUCH ULTRA SYSTEM KIT) W/DEVICE KIT 1 kit by Does not apply route once.  1 each  0   Review of Systems Review of Systems  Constitutional: Negative for diaphoresis and unexpected weight change.  HENT: Negative for drooling and tinnitus.   Eyes: Negative for photophobia and visual disturbance.  Respiratory: Negative for choking and stridor.   Gastrointestinal: Negative for vomiting and blood in stool.  Genitourinary: Negative for hematuria and decreased urine volume.     Objective:   Physical Exam BP 110/68  Pulse 85  Temp(Src) 98 F (36.7 C) (Oral)  SpO2 96% Physical Exam  VS noted, ? Mild ill appaering Constitutional: Pt appears well-developed and well-nourished.  HENT: Head: Normocephalic.  Right Ear: External ear normal.  Left Ear: External ear normal.  Bilat tm's mild erythema.  Sinus nontender.  Pharynx mild erythema Eyes: Conjunctivae and EOM are normal. Pupils are equal, round, and reactive to light.  Neck: Normal range of motion. Neck supple.  Cardiovascular: Normal rate and regular rhythm.   Pulmonary/Chest: Effort normal and breath sounds normal.  Abd:  Soft, NT, non-distended, + BS, no flank tender Neurological: Pt is alert. No cranial nerve deficit.  Skin: Skin is warm. No erythema.  Psychiatric: Pt behavior is normal. Thought content normal.  RLE with mild edema to mild calf stump, no erythema, tender, abscess LLE with mod to severe diffuse edema, wrap in place not removed, but mild tender to mid anterior LLE above the ankle       Assessment & Plan:

## 2011-01-29 NOTE — Assessment & Plan Note (Signed)
2010 echo reveiwed with pt - normal EF, has mild diast dysfunction;  To cont current meds, but edema most likely related to venous insufficiency;  Agree with fitted compression stocking as planned, pt educated and reassured, unfortunately this will be chronic issue

## 2011-01-29 NOTE — Telephone Encounter (Signed)
Called [name] with [facility] left message to call back

## 2011-01-29 NOTE — Assessment & Plan Note (Signed)
Presumed mild recurrent - for doxy course;  to f/u any worsening symptoms or concerns

## 2011-01-29 NOTE — Telephone Encounter (Signed)
Patient was seen in our office today and taken care of.

## 2011-01-30 ENCOUNTER — Telehealth: Payer: Self-pay | Admitting: *Deleted

## 2011-01-30 MED ORDER — CEPHALEXIN 500 MG PO CAPS: 500.0000 mg | ORAL_CAPSULE | Freq: Four times a day (QID) | ORAL | Status: AC

## 2011-01-30 NOTE — Telephone Encounter (Signed)
Pt left msg on vm md sent in doxycycline and she states she can't take doxycycline med makes her extremely ill, intense diarrhea & nausea. Requesting md to sent something else into pharmacy, and add doxycycline to her allergies.Marland KitchenMarland Kitchen9/14/12@4 :51pm/LMB

## 2011-01-30 NOTE — Telephone Encounter (Signed)
Ok to change to cephalexin asd

## 2011-01-30 NOTE — Telephone Encounter (Signed)
Patient informed. 

## 2011-02-05 ENCOUNTER — Other Ambulatory Visit: Payer: Self-pay

## 2011-02-05 MED ORDER — FUROSEMIDE 40 MG PO TABS: 40.0000 mg | ORAL_TABLET | Freq: Two times a day (BID) | ORAL | Status: DC

## 2011-02-13 ENCOUNTER — Ambulatory Visit: Payer: Medicare Other | Admitting: Physical Medicine & Rehabilitation

## 2011-02-16 LAB — GLUCOSE, CAPILLARY
Glucose-Capillary: 101 — ABNORMAL HIGH
Glucose-Capillary: 102 — ABNORMAL HIGH
Glucose-Capillary: 103 — ABNORMAL HIGH
Glucose-Capillary: 103 — ABNORMAL HIGH
Glucose-Capillary: 104 — ABNORMAL HIGH
Glucose-Capillary: 104 — ABNORMAL HIGH
Glucose-Capillary: 106 — ABNORMAL HIGH
Glucose-Capillary: 107 — ABNORMAL HIGH
Glucose-Capillary: 111 — ABNORMAL HIGH
Glucose-Capillary: 111 — ABNORMAL HIGH
Glucose-Capillary: 112 — ABNORMAL HIGH
Glucose-Capillary: 115 — ABNORMAL HIGH
Glucose-Capillary: 117 — ABNORMAL HIGH
Glucose-Capillary: 118 — ABNORMAL HIGH
Glucose-Capillary: 133 — ABNORMAL HIGH
Glucose-Capillary: 185 — ABNORMAL HIGH
Glucose-Capillary: 73
Glucose-Capillary: 78
Glucose-Capillary: 80
Glucose-Capillary: 83
Glucose-Capillary: 85
Glucose-Capillary: 86
Glucose-Capillary: 87
Glucose-Capillary: 87
Glucose-Capillary: 88
Glucose-Capillary: 89
Glucose-Capillary: 91
Glucose-Capillary: 93
Glucose-Capillary: 95
Glucose-Capillary: 96
Glucose-Capillary: 97
Glucose-Capillary: 123 — ABNORMAL HIGH
Glucose-Capillary: 136 — ABNORMAL HIGH
Glucose-Capillary: 143 — ABNORMAL HIGH
Glucose-Capillary: 95
Glucose-Capillary: 96
Glucose-Capillary: 98

## 2011-02-16 LAB — BASIC METABOLIC PANEL
BUN: 10
BUN: 4 — ABNORMAL LOW
BUN: 5 — ABNORMAL LOW
BUN: 5 — ABNORMAL LOW
BUN: 6
BUN: 15
CO2: 26
CO2: 26
CO2: 27
CO2: 30
CO2: 30
CO2: 30
CO2: 25
CO2: 26
Calcium: 7.9 — ABNORMAL LOW
Calcium: 8 — ABNORMAL LOW
Calcium: 8 — ABNORMAL LOW
Calcium: 8.3 — ABNORMAL LOW
Calcium: 8.6
Calcium: 8.7
Calcium: 8.6
Calcium: 9
Chloride: 100
Chloride: 101
Chloride: 94 — ABNORMAL LOW
Chloride: 94 — ABNORMAL LOW
Chloride: 95 — ABNORMAL LOW
Chloride: 98
Chloride: 99
Chloride: 100
Chloride: 105
Creatinine, Ser: 0.65
Creatinine, Ser: 0.72
Creatinine, Ser: 0.73
Creatinine, Ser: 0.74
Creatinine, Ser: 0.77
Creatinine, Ser: 0.84
Creatinine, Ser: 1.08
Creatinine, Ser: 1.35 — ABNORMAL HIGH
GFR calc Af Amer: >60
GFR calc Af Amer: >60
GFR calc Af Amer: >60
GFR calc Af Amer: >60
GFR calc Af Amer: >60
GFR calc Af Amer: >60
GFR calc Af Amer: >60
GFR calc Af Amer: >60
GFR calc Af Amer: >60
GFR calc Af Amer: 48 — ABNORMAL LOW
GFR calc Af Amer: >60
GFR calc non Af Amer: >60
GFR calc non Af Amer: >60
GFR calc non Af Amer: >60
GFR calc non Af Amer: >60
Glucose, Bld: 76
Glucose, Bld: 79
Glucose, Bld: 98
Glucose, Bld: 103 — ABNORMAL HIGH
Potassium: 3.2 — ABNORMAL LOW
Potassium: 3.7
Potassium: 3.9
Potassium: 4
Sodium: 133 — ABNORMAL LOW
Sodium: 133 — ABNORMAL LOW
Sodium: 134 — ABNORMAL LOW
Sodium: 136
Sodium: 138
Sodium: 139
Sodium: 141
Sodium: 132 — ABNORMAL LOW

## 2011-02-16 LAB — CBC
HCT: 26.2 — ABNORMAL LOW
HCT: 31.7 — ABNORMAL LOW
HCT: 34.4 — ABNORMAL LOW
Hemoglobin: 10 — ABNORMAL LOW
Hemoglobin: 10.3 — ABNORMAL LOW
Hemoglobin: 11.4 — ABNORMAL LOW
Hemoglobin: 8.9 — ABNORMAL LOW
Hemoglobin: 8.9 — ABNORMAL LOW
Hemoglobin: 10.4 — ABNORMAL LOW
MCHC: 32.7
MCHC: 33.4
MCHC: 33.5
MCHC: 33.8
MCHC: 33
MCHC: 33.3
MCV: 85.7
MCV: 85.7
MCV: 86
MCV: 86.1
MCV: 87
MCV: 87.8
MCV: 88.2
MCV: 88.2
MCV: 88.9
Platelets: 288
Platelets: 380
Platelets: 425 — ABNORMAL HIGH
Platelets: 501 — ABNORMAL HIGH
Platelets: 196
RBC: 3.06 — ABNORMAL LOW
RBC: 3.13 — ABNORMAL LOW
RBC: 3.15 — ABNORMAL LOW
RBC: 3.48 — ABNORMAL LOW
RBC: 3.64 — ABNORMAL LOW
RBC: 3.9
RBC: 3.56 — ABNORMAL LOW
RDW: 13.8
RDW: 14.1
RDW: 14.2
RDW: 16.9 — ABNORMAL HIGH
WBC: 11 — ABNORMAL HIGH
WBC: 5.9
WBC: 5.9
WBC: 6.2
WBC: 7.4
WBC: 6.6

## 2011-02-16 LAB — COMPREHENSIVE METABOLIC PANEL
ALT: 13
Albumin: 2.1 — ABNORMAL LOW
Calcium: 8.5
GFR calc Af Amer: >60
Glucose, Bld: 85
Sodium: 130 — ABNORMAL LOW
Total Protein: 6

## 2011-02-16 LAB — POCT I-STAT, CHEM 8
BUN: 29 — ABNORMAL HIGH
Calcium, Ion: 1.17
Chloride: 98
Creatinine, Ser: 1.9 — ABNORMAL HIGH
TCO2: 29

## 2011-02-16 LAB — LIPID PANEL
LDL Cholesterol: 60
Total CHOL/HDL Ratio: 4.8
VLDL: 15

## 2011-02-16 LAB — FERRITIN: Ferritin: 281 (ref 10–291)

## 2011-02-16 LAB — MAGNESIUM
Magnesium: 1.7
Magnesium: 2

## 2011-02-16 LAB — CULTURE, BLOOD (ROUTINE X 2): Culture: NO GROWTH

## 2011-02-16 LAB — VITAMIN B12: Vitamin B-12: 163 — ABNORMAL LOW (ref 211–911)

## 2011-02-16 LAB — URINE MICROSCOPIC-ADD ON

## 2011-02-16 LAB — URINALYSIS, ROUTINE W REFLEX MICROSCOPIC
Bilirubin Urine: NEGATIVE
Bilirubin Urine: NEGATIVE
Glucose, UA: NEGATIVE
Hgb urine dipstick: NEGATIVE
Ketones, ur: NEGATIVE
Ketones, ur: NEGATIVE
Protein, ur: 30 — AB
Protein, ur: NEGATIVE
Urobilinogen, UA: 0.2
pH: 5

## 2011-02-16 LAB — PROTIME-INR: INR: 1.3

## 2011-02-16 LAB — URINE CULTURE
Colony Count: NO GROWTH
Colony Count: NO GROWTH
Culture: NO GROWTH

## 2011-02-16 LAB — IRON AND TIBC
Iron: 10 — ABNORMAL LOW
TIBC: 128 — ABNORMAL LOW
UIBC: 118

## 2011-02-16 LAB — APTT: aPTT: 28

## 2011-02-16 LAB — RETICULOCYTES
RBC.: 3.34 — ABNORMAL LOW
Retic Count, Absolute: 26.7

## 2011-02-16 LAB — B-NATRIURETIC PEPTIDE (CONVERTED LAB): Pro B Natriuretic peptide (BNP): 336 — ABNORMAL HIGH

## 2011-02-16 LAB — HEMOGLOBIN A1C: Mean Plasma Glucose: 126

## 2011-02-18 LAB — BASIC METABOLIC PANEL
BUN: 13
CO2: 27
Calcium: 8.4
Chloride: 90 — ABNORMAL LOW
Creatinine, Ser: 0.97
GFR calc Af Amer: >60
Glucose, Bld: 90

## 2011-02-18 LAB — CBC
MCHC: 33.4
MCV: 86.8
RBC: 3.23 — ABNORMAL LOW
RDW: 13.6

## 2011-02-18 LAB — CULTURE, BLOOD (ROUTINE X 2)

## 2011-02-18 LAB — GLUCOSE, CAPILLARY

## 2011-02-18 LAB — DIFFERENTIAL
Basophils Absolute: 0
Basophils Relative: 0
Eosinophils Absolute: 0
Monocytes Relative: 4
Neutrophils Relative %: 91 — ABNORMAL HIGH

## 2011-02-24 ENCOUNTER — Telehealth: Payer: Self-pay

## 2011-02-24 DIAGNOSIS — R269 Unspecified abnormalities of gait and mobility: Secondary | ICD-10-CM

## 2011-02-24 NOTE — Telephone Encounter (Signed)
Ok for verbal, is this ok?

## 2011-02-24 NOTE — Telephone Encounter (Signed)
Request order for Social Worker consultation, walker (?) and hydrocolide to protection ulcer of LT buttock.

## 2011-02-25 NOTE — Telephone Encounter (Signed)
Called left message to call back 

## 2011-02-25 NOTE — Telephone Encounter (Signed)
Called Jessica with [facility] left message to call back 

## 2011-02-26 DIAGNOSIS — L8992 Pressure ulcer of unspecified site, stage 2: Secondary | ICD-10-CM

## 2011-02-26 DIAGNOSIS — I509 Heart failure, unspecified: Secondary | ICD-10-CM

## 2011-02-26 DIAGNOSIS — E119 Type 2 diabetes mellitus without complications: Secondary | ICD-10-CM

## 2011-02-26 DIAGNOSIS — L89309 Pressure ulcer of unspecified buttock, unspecified stage: Secondary | ICD-10-CM

## 2011-02-26 DIAGNOSIS — R269 Unspecified abnormalities of gait and mobility: Secondary | ICD-10-CM | POA: Insufficient documentation

## 2011-02-26 MED ORDER — CEPHALEXIN 500 MG PO CAPS: 500.0000 mg | ORAL_CAPSULE | Freq: Four times a day (QID) | ORAL | Status: AC

## 2011-02-26 NOTE — Telephone Encounter (Signed)
Called Jessica with [facility] left message to call back 

## 2011-02-26 NOTE — Telephone Encounter (Signed)
Called Crystal Fitzpatrick informed verbal ok for all. They will need a written rx for walker and patient can take to [facility] supply. Also the patient has had a fever stated last night 100.1 , pain and burning when urinating. [name] requesting an order for UA, Please advise on all.

## 2011-02-26 NOTE — Telephone Encounter (Signed)
Left mess to call office back.   

## 2011-02-26 NOTE — Telephone Encounter (Signed)
rx done hardcopy to [facility]  Ok for UA  Ok for cephalexin course - done per [employer]

## 2011-02-27 ENCOUNTER — Ambulatory Visit: Payer: Medicare Other | Admitting: Internal Medicine

## 2011-02-27 LAB — CBC
HCT: 32.6 — ABNORMAL LOW
Hemoglobin: 11.3 — ABNORMAL LOW
MCV: 85
RDW: 13.4
WBC: 8.4

## 2011-02-27 LAB — LIPASE, BLOOD: Lipase: 12

## 2011-02-27 LAB — BASIC METABOLIC PANEL
CO2: 29
Calcium: 8.6
Chloride: 88 — ABNORMAL LOW
GFR calc Af Amer: >60
GFR calc non Af Amer: >60
Potassium: 4.5
Sodium: 120 — ABNORMAL LOW
Sodium: 123 — ABNORMAL LOW

## 2011-02-27 LAB — COMPREHENSIVE METABOLIC PANEL
Alkaline Phosphatase: 46
BUN: 5 — ABNORMAL LOW
Chloride: 85 — ABNORMAL LOW
Glucose, Bld: 115 — ABNORMAL HIGH
Potassium: 4.5
Total Bilirubin: 0.8

## 2011-02-27 LAB — DIFFERENTIAL
Basophils Absolute: 0
Basophils Relative: 0
Neutro Abs: 7.2
Neutrophils Relative %: 86 — ABNORMAL HIGH

## 2011-02-27 LAB — URINE CULTURE: Colony Count: 75000

## 2011-02-27 LAB — URINALYSIS, MICROSCOPIC ONLY
Hgb urine dipstick: NEGATIVE
Specific Gravity, Urine: 1.007
Urobilinogen, UA: 0.2

## 2011-02-27 NOTE — Telephone Encounter (Signed)
Called Elvera Bicker at [facility] informed of Antibiotic sent in for UTI as well faxed hardcopy of rx for rolling walker to [employer]. Elvera Bicker at [phone]

## 2011-03-06 ENCOUNTER — Telehealth: Payer: Self-pay | Admitting: *Deleted

## 2011-03-06 NOTE — Telephone Encounter (Signed)
Called sw Crystal Fitzpatrick) left msg on vm md ok for verabl order to go back out to see pt.Marland KitchenMarland Kitchen

## 2011-03-06 NOTE — Telephone Encounter (Signed)
Ok for verbal 

## 2011-03-06 NOTE — Telephone Encounter (Signed)
Left vm stating went to visit patient yesterday. Pt would like to move to another apartment. Requesting a verbal order to go back and visit to help pt with different options. Is this ok?...03/06/11@10 :14am/LMB

## 2011-03-13 ENCOUNTER — Telehealth: Payer: Self-pay

## 2011-03-13 MED ORDER — CEPHALEXIN 500 MG PO CAPS: 500.0000 mg | ORAL_CAPSULE | Freq: Four times a day (QID) | ORAL | Status: AC

## 2011-03-13 NOTE — Telephone Encounter (Signed)
Please come to Saturday clinic tomorrow if no fever or confusion

## 2011-03-13 NOTE — Telephone Encounter (Signed)
Done per emr 

## 2011-03-13 NOTE — Telephone Encounter (Signed)
Called the patient and she cannot get here tomorrow, and is still requesting antibiotic. She is also requesting a referral to a urologist if you think necessary [place] area is where she could go that would be more convenient).

## 2011-03-13 NOTE — Telephone Encounter (Signed)
Pt called stating she is still having dysuria and mild odorous urine.Pt is requesting refill for ABX, please advise.

## 2011-03-16 NOTE — Telephone Encounter (Signed)
Patient informed. 

## 2011-03-17 ENCOUNTER — Encounter: Payer: Medicare Other | Attending: Physical Medicine & Rehabilitation | Admitting: Physical Medicine & Rehabilitation

## 2011-03-30 ENCOUNTER — Encounter: Payer: Self-pay | Admitting: Internal Medicine

## 2011-03-30 ENCOUNTER — Ambulatory Visit (INDEPENDENT_AMBULATORY_CARE_PROVIDER_SITE_OTHER): Payer: Medicare Other | Admitting: Internal Medicine

## 2011-03-30 ENCOUNTER — Other Ambulatory Visit: Payer: Self-pay | Admitting: Internal Medicine

## 2011-03-30 ENCOUNTER — Other Ambulatory Visit (INDEPENDENT_AMBULATORY_CARE_PROVIDER_SITE_OTHER): Payer: Medicare Other

## 2011-03-30 VITALS — BP 110/60 | HR 83 | Temp 97.0°F

## 2011-03-30 DIAGNOSIS — R300 Dysuria: Secondary | ICD-10-CM

## 2011-03-30 DIAGNOSIS — E119 Type 2 diabetes mellitus without complications: Secondary | ICD-10-CM

## 2011-03-30 DIAGNOSIS — I10 Essential (primary) hypertension: Secondary | ICD-10-CM

## 2011-03-30 DIAGNOSIS — I509 Heart failure, unspecified: Secondary | ICD-10-CM

## 2011-03-30 NOTE — Assessment & Plan Note (Signed)
stable overall by hx and exam, most recent data reviewed with pt, and pt to continue medical treatment as before 

## 2011-03-30 NOTE — Assessment & Plan Note (Signed)
stable overall by hx and exam, most recent data reviewed with pt, and pt to continue medical treatment as before  Lab Results  Component Value Date   HGBA1C 6.3 12/08/2010

## 2011-03-30 NOTE — Assessment & Plan Note (Signed)
Cant r/o recurrent UTI - for urine studies,  to f/u any worsening symptoms or concerns

## 2011-03-30 NOTE — Patient Instructions (Signed)
Continue all other medications as before Please go to LAB in the Basement for the blood and/or urine tests to be done today Please call the phone number [phone] (the PhoneTree System) for results of testing in 2-3 days;  When calling, simply dial the number, and when prompted enter the MRN number above (the Medical Record Number) and the # key, then the message should start. Please return in 4 months, or sooner if needed

## 2011-03-30 NOTE — Assessment & Plan Note (Signed)
stable overall by hx and exam, most recent data reviewed with pt, and pt to continue medical treatment as before  BP Readings from Last 3 Encounters:  03/30/11 110/60  01/29/11 110/68  12/08/10 120/58

## 2011-03-30 NOTE — Progress Notes (Signed)
Subjective:    Patient ID: Crystal Fitzpatrick, female    DOB: 07-Apr-1946, 65 y.o.   MRN: [id number]  HPI  Here to f/u;  Had recnet UTI smptoms, finished her antibx we sent in, but now with mild dysuria again just today, wihtout n/v, fever, incresaed back pain, chills, freq, urgency or hematuria.  Has minor urianry incont, and some stress incontinence with sneezing.  Here to f/u; overall doing ok,  Pt denies chest pain, increased sob or doe, wheezing, orthopnea, PND, increased LE swelling, palpitations, dizziness or syncope.  Pt denies new neurological symptoms such as new headache, or facial or extremity weakness or numbness   Pt denies polydipsia, polyuria, or low sugar symptoms such as weakness or confusion improved with po intake.  Pt states overall good compliance with meds, trying to follow lower cholesterol, diabetic diet, wt overall down per pt as she has lost 2 dress sizes but no wt documented, and tried to do some excercises daily in the wheelchair.  Has occasional stress related loose stools., without blood.  No overt bleeding or bruising. Pt denies new neurological symptoms such as new headache, or facial or extremity weakness or numbness   RLE prosthetic should be coming in soon and she can start to ambulate more (has a previous prosthetic but has never been able to wear it due to RLE swelling that has persisted).  Did fall out of the wheelchair trying to get to the phone last wk but no injury.   Past Medical History  Diagnosis Date  . DIABETES MELLITUS, TYPE II 07/17/2008  . B12 DEFICIENCY 07/17/2008  . Acute gouty arthropathy 12/11/2008  . HYPONATREMIA 03/20/2010  . HYPERKALEMIA 10/31/2009  . ANEMIA-NOS 07/17/2008  . ANXIETY 07/17/2008  . Chronic pain syndrome 02/14/2010  . Trigeminal neuralgia 02/14/2010  . HEARING LOSS, RIGHT EAR 06/04/2009  . HYPERTENSION 07/17/2008  . CHF 01/28/2009  . Pneumonia, organism unspecified 02/14/2010  . GERD 07/17/2008  . IBS 06/04/2009  . UNSPECIFIED HEPATITIS 01/28/2009  .  RENAL INSUFFICIENCY 10/31/2009  . MENOPAUSAL DISORDER 12/03/2009  . Cellulitis and abscess of leg, except foot 11/15/2008  . SKIN ULCER, CHRONIC 02/08/2009  . Rheumatoid arthritis 07/17/2008  . SKIN LESION 06/04/2009  . ARTHRITIS 01/28/2009  . FOOT PAIN 07/17/2008  . HYPERSOMNIA 02/14/2010  . Altered mental status 02/07/2010  . PERIPHERAL EDEMA 08/30/2008  . Wheezing 12/03/2009  . ABDOMINAL DISTENSION 10/11/2008  . Dysuria 11/11/2009  . Hypoxemia 03/20/2010  . ANAPHYLACTIC SHOCK 05/15/2009  . NEUROPATHY, HX OF 01/28/2009  . COLONIC POLYPS, HX OF 07/17/2008  . AMPUTATION, BELOW KNEE, RIGHT, HX OF 01/28/2009  . Hyperlipemia 09/24/2010  . Hyperlipidemia 09/24/2010  . Seizure 10/26/2010   Past Surgical History  Procedure Date  . Amputation 02/02/2008    below right knee  . S/p egd 2007    with Botox for? Achalasia  . Tonsillectomy   . S/p breast biopsy 1992  . 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 Tubal ligation   . Nasal septum surgery   . Foot surgury 1980 and 1981    reports that she has quit smoking. She does not have any smokeless tobacco history on file. She reports that she does not drink alcohol or use illicit drugs. family history includes Breast cancer in her sister; Diabetes in her other; and Stroke in her other. Allergies  Allergen Reactions  . Ciprofloxacin   . Doxycycline     Nausea, diarrhea  . Erythromycin   . Morphine   .  Nitrofurantoin     REACTION: itch  . Sulfonamide Derivatives    Current Outpatient Prescriptions on File Prior to Visit  Medication Sig Dispense Refill  . albuterol (PROAIR HFA) 108 (90 BASE) MCG/ACT inhaler Inhale 2 puffs into the lungs every 6 (six) hours as needed.        . ALPRAZolam (XANAX) 0.25 MG tablet Take 0.25 mg by mouth 3 (three) times daily as needed.        . Ascorbic Acid (VITAMIN C) 500 MG tablet Take 500 mg by mouth daily.        . Blood Glucose Monitoring Suppl (ONE TOUCH ULTRA 2) W/DEVICE KIT 1 Device by Does not apply route once.  1  each  0  . Blood Glucose Monitoring Suppl (ONE TOUCH ULTRA SYSTEM KIT) W/DEVICE KIT 1 kit by Does not apply route once.  1 each  0  . Cholecalciferol (VITAMIN D) 400 UNITS capsule Take 400 Units by mouth daily.        . diphenoxylate-atropine (LOMOTIL) 2.5-0.025 MG per tablet Take 1 tablet by mouth 2 (two) times daily as needed.  30 tablet  2  . DULoxetine (CYMBALTA) 60 MG capsule Take 60 mg by mouth 2 (two) times daily.        . fentaNYL (DURAGESIC - DOSED MCG/HR) 75 MCG/HR Place 1 patch onto the skin. 1 patch every 3 days       . furosemide (LASIX) 40 MG tablet Take 1 tablet (40 mg total) by mouth 2 (two) times daily.  60 tablet  11  . gabapentin (NEURONTIN) 300 MG capsule Take 1 capsule (300 mg total) by mouth 3 (three) times daily. 1 by mouth in the AM, 1/2 by mouth in the afternoon, then 2 by mouth in the PM.      . glucose blood (ONE TOUCH ULTRA TEST) test strip Use to test blood sugar two times daily dx 250.00  100 each  2  . lidocaine (LIDODERM) 5 % Place 1 patch onto the skin. Remove & Discard patch within 12 hours or as directed by MD       . loperamide (ANTI-DIARRHEAL) 2 MG tablet Take 2 mg by mouth 2 (two) times daily as needed.        . Multiple Vitamins-Minerals (CENTRUM SILVER ULTRA WOMENS PO) Take by mouth daily.        [name] olmesartan (BENICAR) 40 MG tablet Take 40 mg by mouth daily.        . Omega-3 Fatty Acids (FISH OIL) 1000 MG CAPS Take by mouth daily.        Letta Pate DELICA LANCETS MISC Use as directed to test blood sugar dx 250.00  100 each  2  . oxyCODONE-acetaminophen (PERCOCET) 10-325 MG per tablet Take 1 tablet by mouth every 6 (six) hours as needed.        . pantoprazole (PROTONIX) 40 MG tablet Take 40 mg by mouth daily.        . promethazine (PHENERGAN) 25 MG tablet TAKE ONE TABLET BY MOUTH EVERY 6 HOURS  AS NEEDED NAUSEA AND VOMITTING  40 tablet  1  . Psyllium (METAMUCIL) 30.9 % POWD Take by mouth. Take as directed       . topiramate (TOPAMAX) 100 MG tablet Take 100 mg  by mouth 3 (three) times daily.         Review of Systems Review of Systems  Constitutional: Negative for diaphoresis and unexpected weight change.  HENT: Negative for drooling and tinnitus.  Eyes: Negative for photophobia and visual disturbance.  Respiratory: Negative for choking and stridor.   Gastrointestinal: Negative for vomiting and blood in stool.  Genitourinary: Negative for hematuria and decreased urine volume.       Objective:   Physical Exam BP 110/60  Pulse 83  Temp(Src) 97 F (36.1 C) (Oral)  SpO2 97% Physical Exam  VS noted, not ill appearing Constitutional: Pt appears well-developed and well-nourished.  HENT: Head: Normocephalic.  Right Ear: External ear normal.  Left Ear: External ear normal.  Eyes: Conjunctivae and EOM are normal. Pupils are equal, round, and reactive to light.  Neck: Normal range of motion. Neck supple.  Cardiovascular: Normal rate and regular rhythm.   Pulmonary/Chest: Effort normal and breath sounds normal.  Abd:  Soft, NT, non-distended, + BS, no flank tender Neurological: Pt is alert. No cranial nerve deficit.  Skin: Skin is warm. No erythema.  Psychiatric: Pt behavior is normal. Thought content normal.  LLE with trace to 1+ edema only to the left knee, no cellulitis, ulcer    Assessment & Plan:

## 2011-03-31 ENCOUNTER — Telehealth: Payer: Self-pay | Admitting: Internal Medicine

## 2011-03-31 LAB — URINALYSIS, ROUTINE W REFLEX MICROSCOPIC
Bilirubin Urine: NEGATIVE
Hgb urine dipstick: NEGATIVE
Ketones, ur: NEGATIVE
Specific Gravity, Urine: 1.015 (ref 1.000–1.030)
Urobilinogen, UA: 0.2 (ref 0.0–1.0)

## 2011-03-31 LAB — BASIC METABOLIC PANEL
BUN: 20 mg/dL (ref 6–23)
CO2: 25 mEq/L (ref 19–32)
Chloride: 102 mEq/L (ref 96–112)
Creatinine, Ser: 1.3 mg/dL — ABNORMAL HIGH (ref 0.4–1.2)
Glucose, Bld: 86 mg/dL (ref 70–99)

## 2011-03-31 LAB — LIPID PANEL: Cholesterol: 210 mg/dL — ABNORMAL HIGH (ref 0–200)

## 2011-03-31 LAB — HEMOGLOBIN A1C: Hgb A1c MFr Bld: 6.3 % (ref 4.6–6.5)

## 2011-03-31 MED ORDER — ATORVASTATIN CALCIUM 10 MG PO TABS: 10.0000 mg | ORAL_TABLET | Freq: Every day | ORAL | Status: DC

## 2011-03-31 MED ORDER — CEPHALEXIN 500 MG PO CAPS: 500.0000 mg | ORAL_CAPSULE | Freq: Four times a day (QID) | ORAL | Status: DC

## 2011-03-31 NOTE — Telephone Encounter (Signed)
ua c/w uti, and chol too high  For cephalexin course - done per emr, and lipitor 10 qd  Zella Ball to notify pt

## 2011-04-01 LAB — URINE CULTURE

## 2011-04-01 NOTE — Telephone Encounter (Signed)
Called and informed Clarisse Gouge of the results and medications.

## 2011-04-06 ENCOUNTER — Encounter: Payer: Medicare Other | Attending: Physical Medicine & Rehabilitation | Admitting: Physical Medicine & Rehabilitation

## 2011-04-06 DIAGNOSIS — L03119 Cellulitis of unspecified part of limb: Secondary | ICD-10-CM | POA: Insufficient documentation

## 2011-04-06 DIAGNOSIS — M1710 Unilateral primary osteoarthritis, unspecified knee: Secondary | ICD-10-CM | POA: Insufficient documentation

## 2011-04-06 DIAGNOSIS — M869 Osteomyelitis, unspecified: Secondary | ICD-10-CM

## 2011-04-06 DIAGNOSIS — G609 Hereditary and idiopathic neuropathy, unspecified: Secondary | ICD-10-CM | POA: Insufficient documentation

## 2011-04-06 DIAGNOSIS — L02419 Cutaneous abscess of limb, unspecified: Secondary | ICD-10-CM | POA: Insufficient documentation

## 2011-04-06 DIAGNOSIS — F341 Dysthymic disorder: Secondary | ICD-10-CM

## 2011-04-06 DIAGNOSIS — S88119A Complete traumatic amputation at level between knee and ankle, unspecified lower leg, initial encounter: Secondary | ICD-10-CM

## 2011-04-06 DIAGNOSIS — G547 Phantom limb syndrome without pain: Secondary | ICD-10-CM | POA: Insufficient documentation

## 2011-04-06 DIAGNOSIS — M069 Rheumatoid arthritis, unspecified: Secondary | ICD-10-CM | POA: Insufficient documentation

## 2011-04-06 NOTE — Assessment & Plan Note (Signed)
Crystal Fitzpatrick is back regarding her multiple issues.  She was at Dr. Yetta Barre office  recently and apparently bumped into a cabinet and has a wound on her left shin.  She is receiving treatment for this.  She was placed on some antibiotics.  Her pain is 3-5/10 described as sharp, intermittent and aching.  Sleep is poor at times.  She is working on new prosthesis with [employer] and that process is ongoing.  She is wearing a shrinker currently.  REVIEW OF SYSTEMS:  Notable for the above.  Full 12-point review is in the written health and history section of the chart.  The patient's mood has been much better.  SOCIAL HISTORY:  Unchanged.  PHYSICAL EXAMINATION:  VITAL SIGNS:  Blood pressure is 126/63, pulse 94, respiratory rate is 16, she is satting 95% on room air. GENERAL:  Patient is pleasant, alert, and oriented x3.  Affect is generally bright and appropriate.  Left leg was examined and she has a 2- cm area along the left shin which is highly necrotic at the center. There was some slight drainage noted.  The area surrounding the wound itself is slightly erythematous and warm.  Right lower extremity is in a compression sock and appears appropriate and is similar in size to the proximal left leg.  She remains overweight. NEUROLOGIC:  Cognitively, she is alert and appropriate with good insight and awareness. MUSCULOSKELETAL:  Upper extremity strength is within in normal limits.  ASSESSMENT: 1. 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 2. Recent left shin wound with mild cellulitis. 3. Peripheral neuropathy. 4. History of rheumatoid arthritis. 5. Osteoarthritis, left knee.  PLAN: 1. Recommended wet-to-dry dressing which I placed today to the left     shin.  She may benefit from an antibiotic dressing to the left shin     as well. 2. Continue compression of the right limb. 3. Fitting process per [employer]. 4. Discussed pain management and medications.  She seems to have     better  insight and awareness overall.  She is to continue with her     Percocet 10/325, one q.4-6 hours p.r.n. #130 and fentanyl patch 75     mcg q.72h hours  #10. 5. I would like to have home health therapy come and follow the     patient's leg and they can work through me as far as any further     wound orders. 6. Make referral to Neuropsychology eventually once doctors also     returns as patient would like further counseling for pain     management and coping skills.  She felt this was beneficial     previously. 7. I will see her back in about 4-6 weeks.     [name], M.D. Electronically Signed    ZTS/MedQ D:  04/06/2011 13:49:46  T:  04/06/2011 20:14:45  Job #:  [id number]

## 2011-04-17 ENCOUNTER — Telehealth: Payer: Self-pay | Admitting: Internal Medicine

## 2011-04-17 DIAGNOSIS — S81009A Unspecified open wound, unspecified knee, initial encounter: Secondary | ICD-10-CM | POA: Diagnosis not present

## 2011-04-17 DIAGNOSIS — R5381 Other malaise: Secondary | ICD-10-CM | POA: Diagnosis not present

## 2011-04-17 DIAGNOSIS — Z9981 Dependence on supplemental oxygen: Secondary | ICD-10-CM | POA: Diagnosis not present

## 2011-04-17 DIAGNOSIS — M6281 Muscle weakness (generalized): Secondary | ICD-10-CM | POA: Diagnosis not present

## 2011-04-17 DIAGNOSIS — E119 Type 2 diabetes mellitus without complications: Secondary | ICD-10-CM | POA: Diagnosis not present

## 2011-04-17 MED ORDER — GLUCOSE BLOOD VI STRP: ORAL_STRIP | Status: AC

## 2011-04-17 NOTE — Telephone Encounter (Signed)
Faxed to pharmacy

## 2011-04-17 NOTE — Telephone Encounter (Signed)
Done hardcopy to robin  

## 2011-04-20 DIAGNOSIS — S91009A Unspecified open wound, unspecified ankle, initial encounter: Secondary | ICD-10-CM | POA: Diagnosis not present

## 2011-04-20 DIAGNOSIS — M6281 Muscle weakness (generalized): Secondary | ICD-10-CM | POA: Diagnosis not present

## 2011-04-20 DIAGNOSIS — E119 Type 2 diabetes mellitus without complications: Secondary | ICD-10-CM | POA: Diagnosis not present

## 2011-04-20 DIAGNOSIS — R5381 Other malaise: Secondary | ICD-10-CM | POA: Diagnosis not present

## 2011-04-20 DIAGNOSIS — Z9981 Dependence on supplemental oxygen: Secondary | ICD-10-CM | POA: Diagnosis not present

## 2011-04-21 DIAGNOSIS — M6281 Muscle weakness (generalized): Secondary | ICD-10-CM | POA: Diagnosis not present

## 2011-04-21 DIAGNOSIS — Z9981 Dependence on supplemental oxygen: Secondary | ICD-10-CM | POA: Diagnosis not present

## 2011-04-21 DIAGNOSIS — E119 Type 2 diabetes mellitus without complications: Secondary | ICD-10-CM | POA: Diagnosis not present

## 2011-04-21 DIAGNOSIS — S91009A Unspecified open wound, unspecified ankle, initial encounter: Secondary | ICD-10-CM | POA: Diagnosis not present

## 2011-04-21 DIAGNOSIS — R5381 Other malaise: Secondary | ICD-10-CM | POA: Diagnosis not present

## 2011-04-23 DIAGNOSIS — M6281 Muscle weakness (generalized): Secondary | ICD-10-CM | POA: Diagnosis not present

## 2011-04-23 DIAGNOSIS — R5381 Other malaise: Secondary | ICD-10-CM | POA: Diagnosis not present

## 2011-04-23 DIAGNOSIS — Z9981 Dependence on supplemental oxygen: Secondary | ICD-10-CM | POA: Diagnosis not present

## 2011-04-23 DIAGNOSIS — S91009A Unspecified open wound, unspecified ankle, initial encounter: Secondary | ICD-10-CM | POA: Diagnosis not present

## 2011-04-23 DIAGNOSIS — E119 Type 2 diabetes mellitus without complications: Secondary | ICD-10-CM | POA: Diagnosis not present

## 2011-04-27 DIAGNOSIS — R5381 Other malaise: Secondary | ICD-10-CM | POA: Diagnosis not present

## 2011-04-27 DIAGNOSIS — S81809A Unspecified open wound, unspecified lower leg, initial encounter: Secondary | ICD-10-CM | POA: Diagnosis not present

## 2011-04-27 DIAGNOSIS — E119 Type 2 diabetes mellitus without complications: Secondary | ICD-10-CM | POA: Diagnosis not present

## 2011-04-27 DIAGNOSIS — M6281 Muscle weakness (generalized): Secondary | ICD-10-CM | POA: Diagnosis not present

## 2011-04-27 DIAGNOSIS — Z9981 Dependence on supplemental oxygen: Secondary | ICD-10-CM | POA: Diagnosis not present

## 2011-04-29 ENCOUNTER — Other Ambulatory Visit: Payer: Self-pay | Admitting: Internal Medicine

## 2011-04-29 MED ORDER — ALPRAZOLAM 0.25 MG PO TABS: 0.2500 mg | ORAL_TABLET | Freq: Three times a day (TID) | ORAL | Status: DC | PRN

## 2011-04-29 NOTE — Telephone Encounter (Signed)
Faxed hardcopy to pharmacy. 

## 2011-04-29 NOTE — Telephone Encounter (Signed)
Done hardcopy to robin  

## 2011-04-30 DIAGNOSIS — Z9981 Dependence on supplemental oxygen: Secondary | ICD-10-CM | POA: Diagnosis not present

## 2011-04-30 DIAGNOSIS — E119 Type 2 diabetes mellitus without complications: Secondary | ICD-10-CM | POA: Diagnosis not present

## 2011-04-30 DIAGNOSIS — R5381 Other malaise: Secondary | ICD-10-CM | POA: Diagnosis not present

## 2011-04-30 DIAGNOSIS — M6281 Muscle weakness (generalized): Secondary | ICD-10-CM | POA: Diagnosis not present

## 2011-04-30 DIAGNOSIS — S81809A Unspecified open wound, unspecified lower leg, initial encounter: Secondary | ICD-10-CM | POA: Diagnosis not present

## 2011-05-02 DIAGNOSIS — Z9981 Dependence on supplemental oxygen: Secondary | ICD-10-CM | POA: Diagnosis not present

## 2011-05-02 DIAGNOSIS — R5381 Other malaise: Secondary | ICD-10-CM | POA: Diagnosis not present

## 2011-05-02 DIAGNOSIS — E119 Type 2 diabetes mellitus without complications: Secondary | ICD-10-CM | POA: Diagnosis not present

## 2011-05-02 DIAGNOSIS — M6281 Muscle weakness (generalized): Secondary | ICD-10-CM | POA: Diagnosis not present

## 2011-05-02 DIAGNOSIS — S81809A Unspecified open wound, unspecified lower leg, initial encounter: Secondary | ICD-10-CM | POA: Diagnosis not present

## 2011-05-05 DIAGNOSIS — M6281 Muscle weakness (generalized): Secondary | ICD-10-CM | POA: Diagnosis not present

## 2011-05-05 DIAGNOSIS — Z9981 Dependence on supplemental oxygen: Secondary | ICD-10-CM | POA: Diagnosis not present

## 2011-05-05 DIAGNOSIS — E119 Type 2 diabetes mellitus without complications: Secondary | ICD-10-CM | POA: Diagnosis not present

## 2011-05-05 DIAGNOSIS — S91009A Unspecified open wound, unspecified ankle, initial encounter: Secondary | ICD-10-CM | POA: Diagnosis not present

## 2011-05-05 DIAGNOSIS — R5381 Other malaise: Secondary | ICD-10-CM | POA: Diagnosis not present

## 2011-05-08 DIAGNOSIS — R5381 Other malaise: Secondary | ICD-10-CM | POA: Diagnosis not present

## 2011-05-08 DIAGNOSIS — Z9981 Dependence on supplemental oxygen: Secondary | ICD-10-CM | POA: Diagnosis not present

## 2011-05-08 DIAGNOSIS — E119 Type 2 diabetes mellitus without complications: Secondary | ICD-10-CM | POA: Diagnosis not present

## 2011-05-08 DIAGNOSIS — M6281 Muscle weakness (generalized): Secondary | ICD-10-CM | POA: Diagnosis not present

## 2011-05-08 DIAGNOSIS — S91009A Unspecified open wound, unspecified ankle, initial encounter: Secondary | ICD-10-CM | POA: Diagnosis not present

## 2011-05-11 DIAGNOSIS — R5381 Other malaise: Secondary | ICD-10-CM | POA: Diagnosis not present

## 2011-05-11 DIAGNOSIS — S81009A Unspecified open wound, unspecified knee, initial encounter: Secondary | ICD-10-CM | POA: Diagnosis not present

## 2011-05-11 DIAGNOSIS — Z9981 Dependence on supplemental oxygen: Secondary | ICD-10-CM | POA: Diagnosis not present

## 2011-05-11 DIAGNOSIS — E119 Type 2 diabetes mellitus without complications: Secondary | ICD-10-CM | POA: Diagnosis not present

## 2011-05-11 DIAGNOSIS — M6281 Muscle weakness (generalized): Secondary | ICD-10-CM | POA: Diagnosis not present

## 2011-05-13 ENCOUNTER — Telehealth: Payer: Self-pay

## 2011-05-13 NOTE — Telephone Encounter (Signed)
Christmas Eve the patient was sent in from Dr. Patsy Lager  2 antibitotics  for cellulitis, levaquin 500 mg qd for 10 days. and Septra DS 2 tablets bid for 10 days. The patient has not started either medication as stated cause stomach upset. Previously was on Keflex 500 mg qd x 10 days (did complete prescription) and was [employer]  And on lasix, all have been completed at this time. RN concerned leg has worsened and is not taking medications prescribed by Dr. Patsy Lager, please advise as RN concerned about the patient. RN requesting past medical history on this patient to better take care of the patient.

## 2011-05-13 NOTE — Telephone Encounter (Signed)
By far the more common reason for nausea is the levaquin (not the septra DS)  Please re-start the septra Ds, and should have OV tomorrow, or ER if leg is worsens, drainage, or fever/chills

## 2011-05-13 NOTE — Telephone Encounter (Signed)
Called April and she agreed to inform patient of information. Patient will schedule appointment on 05/14/11.

## 2011-05-14 ENCOUNTER — Ambulatory Visit: Payer: Medicare Other | Admitting: Internal Medicine

## 2011-05-14 ENCOUNTER — Telehealth: Payer: Self-pay

## 2011-05-14 DIAGNOSIS — Z0289 Encounter for other administrative examinations: Secondary | ICD-10-CM

## 2011-05-14 MED ORDER — CLINDAMYCIN HCL 150 MG PO CAPS: 150.0000 mg | ORAL_CAPSULE | Freq: Four times a day (QID) | ORAL | Status: AC

## 2011-05-14 NOTE — Telephone Encounter (Signed)
Pt called stating she stopped all ABX Rx'd by Dr Patsy Lager (levaquin because JWJ advised and Septra because of Sulfa sensitivity). Leg is still hard, tender to the touch, weeping, with a slight odor. Pt is requesting alternate ABX to treat.  She will re-schedule appt when transportation is available.

## 2011-05-14 NOTE — Telephone Encounter (Signed)
Patient informed. 

## 2011-05-14 NOTE — Telephone Encounter (Signed)
Ok to change to cleocin asd - done escript

## 2011-05-15 DIAGNOSIS — M6281 Muscle weakness (generalized): Secondary | ICD-10-CM | POA: Diagnosis not present

## 2011-05-15 DIAGNOSIS — Z9981 Dependence on supplemental oxygen: Secondary | ICD-10-CM | POA: Diagnosis not present

## 2011-05-15 DIAGNOSIS — S81009A Unspecified open wound, unspecified knee, initial encounter: Secondary | ICD-10-CM | POA: Diagnosis not present

## 2011-05-15 DIAGNOSIS — R5381 Other malaise: Secondary | ICD-10-CM | POA: Diagnosis not present

## 2011-05-15 DIAGNOSIS — E119 Type 2 diabetes mellitus without complications: Secondary | ICD-10-CM | POA: Diagnosis not present

## 2011-05-16 ENCOUNTER — Other Ambulatory Visit: Payer: Self-pay

## 2011-05-16 ENCOUNTER — Telehealth: Payer: Self-pay

## 2011-05-16 MED ORDER — CEPHALEXIN 500 MG PO CAPS: 500.0000 mg | ORAL_CAPSULE | Freq: Four times a day (QID) | ORAL | Status: AC

## 2011-05-16 NOTE — Telephone Encounter (Signed)
Will change back to cephelexin per pt request. Has multiple intolerance.

## 2011-05-16 NOTE — Telephone Encounter (Signed)
Pt's daughter called and stated that pt was prescribed an antibiotic and it is causing her to have stomach upset and dry mouth.  Pt would like to have another antibiotic called in to her pharmacy.  Pls advise.

## 2011-05-16 NOTE — Telephone Encounter (Signed)
Left a message for pt that rx had been sent to pharmacy.

## 2011-05-20 ENCOUNTER — Encounter: Payer: Medicare Other | Admitting: Physical Medicine & Rehabilitation

## 2011-05-21 DIAGNOSIS — Z9981 Dependence on supplemental oxygen: Secondary | ICD-10-CM | POA: Diagnosis not present

## 2011-05-21 DIAGNOSIS — S91009A Unspecified open wound, unspecified ankle, initial encounter: Secondary | ICD-10-CM | POA: Diagnosis not present

## 2011-05-21 DIAGNOSIS — E119 Type 2 diabetes mellitus without complications: Secondary | ICD-10-CM | POA: Diagnosis not present

## 2011-05-21 DIAGNOSIS — R5381 Other malaise: Secondary | ICD-10-CM | POA: Diagnosis not present

## 2011-05-21 DIAGNOSIS — M6281 Muscle weakness (generalized): Secondary | ICD-10-CM | POA: Diagnosis not present

## 2011-05-26 ENCOUNTER — Telehealth: Payer: Self-pay

## 2011-05-26 NOTE — Telephone Encounter (Signed)
Please clarify the antibiotic and the reason for it, how much she has taken so far, and any other symptoms such as painful urination, ST, cough

## 2011-05-26 NOTE — Telephone Encounter (Signed)
Patient informed. 

## 2011-05-26 NOTE — Telephone Encounter (Signed)
Ok to follow for now, cont same antibx,

## 2011-05-26 NOTE — Telephone Encounter (Signed)
Pt is requesting a call back regarding fever today 101.9. I called pt who stated that temperature has returned although she is taking ABX. Pt denies any other sxs but is concerned that this may lead to another seizure.  Pt says she does not want to go to the ER and is requesting advisement from MD

## 2011-05-26 NOTE — Telephone Encounter (Signed)
See phone note 12/29 - pt was Rx'd Keflex. Pt says she has one episode of dysuria last night but has since resolved. Pt denies any cold sxs.

## 2011-05-27 ENCOUNTER — Telehealth: Payer: Self-pay

## 2011-05-27 NOTE — Telephone Encounter (Signed)
No further advice at this time.

## 2011-05-27 NOTE — Telephone Encounter (Signed)
The patient called stating she has almost completed her antibiotic. She is doing much better. Although her leg is still leaking a little, some swelling but not as much as before. Please advise call back number is [phone]

## 2011-05-28 DIAGNOSIS — M6281 Muscle weakness (generalized): Secondary | ICD-10-CM | POA: Diagnosis not present

## 2011-05-28 DIAGNOSIS — R5381 Other malaise: Secondary | ICD-10-CM | POA: Diagnosis not present

## 2011-05-28 DIAGNOSIS — E119 Type 2 diabetes mellitus without complications: Secondary | ICD-10-CM | POA: Diagnosis not present

## 2011-05-28 DIAGNOSIS — S81009A Unspecified open wound, unspecified knee, initial encounter: Secondary | ICD-10-CM | POA: Diagnosis not present

## 2011-05-28 DIAGNOSIS — Z9981 Dependence on supplemental oxygen: Secondary | ICD-10-CM | POA: Diagnosis not present

## 2011-05-28 NOTE — Telephone Encounter (Signed)
Called patient informed of MD's instructions, she decided to schedule an appointment.

## 2011-05-28 NOTE — Telephone Encounter (Signed)
Not sure what to say for this without exam, consider exam - ? Shingles, vs other type of rash';  Best thing for now is to try not sit on it, consider OTC A&D ointment

## 2011-05-28 NOTE — Telephone Encounter (Signed)
The patient informed she has a rash on her left leg, under her bottom. She cannot see the rash but states it does not itch but is very painful. Please advise.

## 2011-05-28 NOTE — Telephone Encounter (Signed)
Called left message to call back 

## 2011-05-29 ENCOUNTER — Ambulatory Visit: Payer: Medicare Other | Admitting: Internal Medicine

## 2011-05-29 ENCOUNTER — Ambulatory Visit (INDEPENDENT_AMBULATORY_CARE_PROVIDER_SITE_OTHER): Payer: Medicare Other | Admitting: Internal Medicine

## 2011-05-29 VITALS — BP 136/78 | HR 89 | Temp 97.6°F

## 2011-05-29 DIAGNOSIS — H6690 Otitis media, unspecified, unspecified ear: Secondary | ICD-10-CM | POA: Diagnosis not present

## 2011-05-29 DIAGNOSIS — R21 Rash and other nonspecific skin eruption: Secondary | ICD-10-CM | POA: Insufficient documentation

## 2011-05-29 DIAGNOSIS — I10 Essential (primary) hypertension: Secondary | ICD-10-CM | POA: Diagnosis not present

## 2011-05-29 DIAGNOSIS — H6691 Otitis media, unspecified, right ear: Secondary | ICD-10-CM

## 2011-05-29 MED ORDER — AMOXICILLIN 500 MG PO CAPS: 500.0000 mg | ORAL_CAPSULE | Freq: Three times a day (TID) | ORAL | Status: AC

## 2011-05-29 MED ORDER — CLOTRIMAZOLE-BETAMETHASONE 1-0.05 % EX CREA: TOPICAL_CREAM | CUTANEOUS | Status: AC

## 2011-05-29 NOTE — Patient Instructions (Addendum)
Take all new medications as prescribed Continue all other medications as before OK to use the duoderm to the area irritated now to help protect in the future Please keep your appointments with your specialists as you have planned - Dr Charlann Lange Please return in 6 months, or sooner if needed

## 2011-05-31 ENCOUNTER — Encounter: Payer: Self-pay | Admitting: Internal Medicine

## 2011-05-31 DIAGNOSIS — H539 Unspecified visual disturbance: Secondary | ICD-10-CM | POA: Diagnosis not present

## 2011-05-31 DIAGNOSIS — M1990 Unspecified osteoarthritis, unspecified site: Secondary | ICD-10-CM | POA: Diagnosis not present

## 2011-05-31 DIAGNOSIS — Z9889 Other specified postprocedural states: Secondary | ICD-10-CM | POA: Diagnosis not present

## 2011-05-31 DIAGNOSIS — E785 Hyperlipidemia, unspecified: Secondary | ICD-10-CM | POA: Diagnosis not present

## 2011-05-31 DIAGNOSIS — I69998 Other sequelae following unspecified cerebrovascular disease: Secondary | ICD-10-CM | POA: Diagnosis not present

## 2011-05-31 DIAGNOSIS — G894 Chronic pain syndrome: Secondary | ICD-10-CM | POA: Diagnosis not present

## 2011-05-31 DIAGNOSIS — H53149 Visual discomfort, unspecified: Secondary | ICD-10-CM | POA: Diagnosis not present

## 2011-05-31 DIAGNOSIS — T8529XA Other mechanical complication of intraocular lens, initial encounter: Secondary | ICD-10-CM | POA: Diagnosis not present

## 2011-05-31 DIAGNOSIS — K219 Gastro-esophageal reflux disease without esophagitis: Secondary | ICD-10-CM | POA: Diagnosis not present

## 2011-05-31 DIAGNOSIS — I10 Essential (primary) hypertension: Secondary | ICD-10-CM | POA: Diagnosis not present

## 2011-05-31 DIAGNOSIS — H04129 Dry eye syndrome of unspecified lacrimal gland: Secondary | ICD-10-CM | POA: Diagnosis not present

## 2011-05-31 NOTE — Assessment & Plan Note (Signed)
irritatvie in nature, likely at least in part related to pressure, for lotrisone asd, and try to keep wt off the left ischial area as able

## 2011-05-31 NOTE — Assessment & Plan Note (Signed)
Mild to mod, for antibx course,  to f/u any worsening symptoms or concerns 

## 2011-05-31 NOTE — Progress Notes (Signed)
Subjective:    Patient ID: Crystal Fitzpatrick, female    DOB: 1945/11/13, 66 y.o.   MRN: [id number]  HPI  Here to f/u, helper in attendence;  Has c/o irritative rash to left prox post thigh/ischial area, with a small site of skin breakdown, without swelling, drainage, oveall mild to mod for the past wk;  Tends to sit all day in chair/wheelchair, no recent falls or trauma, no irritative clothing, no prior hx of same or hx of other ulcer except for an area to distal LLE anterior leg that has been chronic nonhealing for several months but no in flamed today;  Does have more swelling to the LE in the last few days after unna boot removed after trial 9 mo use, has not seemed to overall change tendency to swell, though has had no abscess and only one episode of cellulitis recent   Also Here with 3 days acute onset low grade fever, facial pressure, general weakness and malaise, and right ear fullness and discomfort, but little to no cough and Pt denies chest pain, increased sob or doe, wheezing, orthopnea, PND, increased LE swelling, palpitations, dizziness or syncope. Pt denies new neurological symptoms such as new headache, or facial or extremity weakness or numbness  Pt denies polydipsia, polyuria. Past Medical History  Diagnosis Date  . DIABETES MELLITUS, TYPE II 07/17/2008  . B12 DEFICIENCY 07/17/2008  . Acute gouty arthropathy 12/11/2008  . HYPONATREMIA 03/20/2010  . HYPERKALEMIA 10/31/2009  . ANEMIA-NOS 07/17/2008  . ANXIETY 07/17/2008  . Chronic pain syndrome 02/14/2010  . Trigeminal neuralgia 02/14/2010  . HEARING LOSS, RIGHT EAR 06/04/2009  . HYPERTENSION 07/17/2008  . CHF 01/28/2009  . Pneumonia, organism unspecified 02/14/2010  . GERD 07/17/2008  . IBS 06/04/2009  . UNSPECIFIED HEPATITIS 01/28/2009  . RENAL INSUFFICIENCY 10/31/2009  . MENOPAUSAL DISORDER 12/03/2009  . Cellulitis and abscess of leg, except foot 11/15/2008  . SKIN ULCER, CHRONIC 02/08/2009  . Rheumatoid arthritis 07/17/2008  . SKIN LESION 06/04/2009  .  ARTHRITIS 01/28/2009  . FOOT PAIN 07/17/2008  . HYPERSOMNIA 02/14/2010  . Altered mental status 02/07/2010  . PERIPHERAL EDEMA 08/30/2008  . Wheezing 12/03/2009  . ABDOMINAL DISTENSION 10/11/2008  . Dysuria 11/11/2009  . Hypoxemia 03/20/2010  . ANAPHYLACTIC SHOCK 05/15/2009  . NEUROPATHY, HX OF 01/28/2009  . COLONIC POLYPS, HX OF 07/17/2008  . AMPUTATION, BELOW KNEE, RIGHT, HX OF 01/28/2009  . Hyperlipemia 09/24/2010  . Hyperlipidemia 09/24/2010  . Seizure 10/26/2010   Past Surgical History  Procedure Date  . Amputation 02/02/2008    below right knee  . S/p egd 2007    with Botox for? Achalasia  . Tonsillectomy   . S/p breast biopsy 1992  . 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 Tubal ligation   . Nasal septum surgery   . Foot surgury 1980 and 1981    reports that she has quit smoking. She does not have any smokeless tobacco history on file. She reports that she does not drink alcohol or use illicit drugs. family history includes Breast cancer in her sister; Diabetes in her other; and Stroke in her other. Allergies  Allergen Reactions  . Ciprofloxacin   . Doxycycline     Nausea, diarrhea  . Erythromycin   . Morphine   . Nitrofurantoin     REACTION: itch  . Sulfonamide Derivatives    Current Outpatient Prescriptions on File Prior to Visit  Medication Sig Dispense Refill  . albuterol (PROAIR HFA) 108 (90 BASE)  MCG/ACT inhaler Inhale 2 puffs into the lungs every 6 (six) hours as needed.        . ALPRAZolam (XANAX) 0.25 MG tablet Take 1 tablet (0.25 mg total) by mouth 3 (three) times daily as needed.  90 tablet  2  . Ascorbic Acid (VITAMIN C) 500 MG tablet Take 500 mg by mouth daily.        [name] atorvastatin (LIPITOR) 10 MG tablet Take 1 tablet (10 mg total) by mouth daily.  90 tablet  3  . Blood Glucose Monitoring Suppl (ONE TOUCH ULTRA 2) W/DEVICE KIT 1 Device by Does not apply route once.  1 each  0  . Blood Glucose Monitoring Suppl (ONE TOUCH ULTRA SYSTEM KIT) W/DEVICE KIT 1 kit by  Does not apply route once.  1 each  0  . Cholecalciferol (VITAMIN D) 400 UNITS capsule Take 400 Units by mouth daily.        . diphenoxylate-atropine (LOMOTIL) 2.5-0.025 MG per tablet TAKE 1 TABLET BY MOUTH 2 TIMES A DAY    AS NEEDED  30 tablet  1  . DULoxetine (CYMBALTA) 60 MG capsule Take 60 mg by mouth 2 (two) times daily.        . fentaNYL (DURAGESIC - DOSED MCG/HR) 75 MCG/HR Place 1 patch onto the skin. 1 patch every 3 days       . furosemide (LASIX) 40 MG tablet Take 1 tablet (40 mg total) by mouth 2 (two) times daily.  60 tablet  11  . gabapentin (NEURONTIN) 300 MG capsule Take 1 capsule (300 mg total) by mouth 3 (three) times daily. 1 by mouth in the AM, 1/2 by mouth in the afternoon, then 2 by mouth in the PM.      . glucose blood (ONE TOUCH ULTRA TEST) test strip PRN  100 each  5  . lidocaine (LIDODERM) 5 % Place 1 patch onto the skin. Remove & Discard patch within 12 hours or as directed by MD       . loperamide (ANTI-DIARRHEAL) 2 MG tablet Take 2 mg by mouth 2 (two) times daily as needed.        . Multiple Vitamins-Minerals (CENTRUM SILVER ULTRA WOMENS PO) Take by mouth daily.        [name] olmesartan (BENICAR) 40 MG tablet Take 40 mg by mouth daily.        . Omega-3 Fatty Acids (FISH OIL) 1000 MG CAPS Take by mouth daily.        Letta Pate DELICA LANCETS MISC Use as directed to test blood sugar dx 250.00  100 each  2  . oxyCODONE-acetaminophen (PERCOCET) 10-325 MG per tablet Take 1 tablet by mouth every 6 (six) hours as needed.        . pantoprazole (PROTONIX) 40 MG tablet Take 40 mg by mouth daily.        . promethazine (PHENERGAN) 25 MG tablet TAKE ONE TABLET BY MOUTH EVERY 6 HOURS  AS NEEDED NAUSEA AND VOMITTING  40 tablet  1  . Psyllium (METAMUCIL) 30.9 % POWD Take by mouth. Take as directed       . topiramate (TOPAMAX) 100 MG tablet Take 100 mg by mouth 3 (three) times daily.         Review of Systems Review of Systems  Constitutional: Negative for diaphoresis and unexpected weight  change.  HENT: Negative for drooling and tinnitus.   Eyes: Negative for photophobia and visual disturbance.  Respiratory: Negative for choking and stridor.  Gastrointestinal: Negative for vomiting and blood in stool.  Genitourinary: Negative for hematuria and decreased urine volume.  Objective:   Physical Exam BP 136/78  Pulse 89  Temp(Src) 97.6 F (36.4 C) (Oral)  SpO2 95% Physical Exam  VS noted Constitutional: Pt appears well-developed and well-nourished.  HENT: Head: Normocephalic.  Right Ear: External ear normal.  Left Ear: External ear normal.  Right tm's severe erythema.  Sinus tender.  Pharynx mild erythema Eyes: Conjunctivae and EOM are normal. Pupils are equal, round, and reactive to light.  Neck: Normal range of motion. Neck supple.  Cardiovascular: Normal rate and regular rhythm.   Pulmonary/Chest: Effort normal and breath sounds normal.  Abd:  Soft, NT, non-distended, + BS Neurological: Pt is alert. No cranial nerve deficit.  LLE with 1+ edema and scabbed area distal ant leg 1x1 cm without irriation or cellulitis Skin: Skin is warm. No erythema. Left prox thigh/buttock with silvery scaly erythem area 1x4 cm, nontender, no swelling, no drainage with small skin tear like wound central Psychiatric: Pt behavior is normal. Thought content normal.     Assessment & Plan:

## 2011-05-31 NOTE — Assessment & Plan Note (Signed)
stable overall by hx and exam, most recent data reviewed with pt, and pt to continue medical treatment as before  BP Readings from Last 3 Encounters:  05/29/11 136/78  03/30/11 110/60  01/29/11 110/68

## 2011-06-01 ENCOUNTER — Telehealth: Payer: Self-pay

## 2011-06-01 DIAGNOSIS — H539 Unspecified visual disturbance: Secondary | ICD-10-CM | POA: Diagnosis not present

## 2011-06-01 DIAGNOSIS — H04129 Dry eye syndrome of unspecified lacrimal gland: Secondary | ICD-10-CM | POA: Diagnosis not present

## 2011-06-01 DIAGNOSIS — H532 Diplopia: Secondary | ICD-10-CM | POA: Diagnosis not present

## 2011-06-01 DIAGNOSIS — H2510 Age-related nuclear cataract, unspecified eye: Secondary | ICD-10-CM | POA: Diagnosis not present

## 2011-06-01 NOTE — Telephone Encounter (Signed)
Informed verbal ok 

## 2011-06-01 NOTE — Telephone Encounter (Signed)
April with [facility] called requesting a verbal ok to discharge the patient from care, please advise

## 2011-06-01 NOTE — Telephone Encounter (Signed)
Ok for verbal 

## 2011-06-03 DIAGNOSIS — H269 Unspecified cataract: Secondary | ICD-10-CM | POA: Diagnosis not present

## 2011-06-03 DIAGNOSIS — Z79899 Other long term (current) drug therapy: Secondary | ICD-10-CM | POA: Diagnosis not present

## 2011-06-03 DIAGNOSIS — I10 Essential (primary) hypertension: Secondary | ICD-10-CM | POA: Diagnosis not present

## 2011-06-03 DIAGNOSIS — H2510 Age-related nuclear cataract, unspecified eye: Secondary | ICD-10-CM | POA: Diagnosis not present

## 2011-06-03 DIAGNOSIS — E119 Type 2 diabetes mellitus without complications: Secondary | ICD-10-CM | POA: Diagnosis not present

## 2011-06-04 ENCOUNTER — Other Ambulatory Visit: Payer: Self-pay | Admitting: Internal Medicine

## 2011-06-10 ENCOUNTER — Ambulatory Visit (HOSPITAL_COMMUNITY)
Admission: RE | Admit: 2011-06-10 | Discharge: 2011-06-10 | Disposition: A | Discharge status: Home / Self Care | Payer: Medicare Other | Source: Ambulatory Visit | Attending: Physical Medicine & Rehabilitation | Admitting: Physical Medicine & Rehabilitation

## 2011-06-10 ENCOUNTER — Telehealth: Payer: Self-pay | Admitting: *Deleted

## 2011-06-10 DIAGNOSIS — M79609 Pain in unspecified limb: Secondary | ICD-10-CM | POA: Insufficient documentation

## 2011-06-10 DIAGNOSIS — M7989 Other specified soft tissue disorders: Secondary | ICD-10-CM | POA: Insufficient documentation

## 2011-06-10 DIAGNOSIS — R52 Pain, unspecified: Secondary | ICD-10-CM

## 2011-06-10 DIAGNOSIS — E119 Type 2 diabetes mellitus without complications: Secondary | ICD-10-CM

## 2011-06-10 DIAGNOSIS — R609 Edema, unspecified: Secondary | ICD-10-CM

## 2011-06-10 NOTE — Progress Notes (Signed)
Prliminary results  Left:  No evidence of DVT, superficial thrombosis, or Baker's cyst.    Milta Deiters, RVS, [place] D. 06/10/11 [id number]

## 2011-06-10 NOTE — Telephone Encounter (Addendum)
[facility] lab called regarding results of venous doppler. Pt was negative for blood clots or Baker's Cyst-JWJ pt

## 2011-06-11 ENCOUNTER — Telehealth: Payer: Self-pay

## 2011-06-11 NOTE — Telephone Encounter (Signed)
Results noted - no tx changes recommended - appears test was ordered by PM&R [name] - thanks

## 2011-06-11 NOTE — Telephone Encounter (Signed)
Pt may not be able to come Friday due to other appts and bad weather.  May call for Saturday Clinic appt. If not better.

## 2011-06-11 NOTE — Telephone Encounter (Signed)
Patient called [facility] c/o continued ear pain and would like a refill on antibiotic. MD out of the officwe and will need appt before any additional antibiotics

## 2011-06-12 DIAGNOSIS — L608 Other nail disorders: Secondary | ICD-10-CM | POA: Diagnosis not present

## 2011-06-12 DIAGNOSIS — M7989 Other specified soft tissue disorders: Secondary | ICD-10-CM | POA: Diagnosis not present

## 2011-06-12 DIAGNOSIS — E1149 Type 2 diabetes mellitus with other diabetic neurological complication: Secondary | ICD-10-CM | POA: Diagnosis not present

## 2011-06-15 ENCOUNTER — Encounter: Payer: Medicare Other | Admitting: Physical Medicine & Rehabilitation

## 2011-06-17 ENCOUNTER — Encounter: Payer: Medicare Other | Attending: Physical Medicine & Rehabilitation | Admitting: Physical Medicine & Rehabilitation

## 2011-06-17 DIAGNOSIS — G609 Hereditary and idiopathic neuropathy, unspecified: Secondary | ICD-10-CM

## 2011-06-17 DIAGNOSIS — G547 Phantom limb syndrome without pain: Secondary | ICD-10-CM | POA: Diagnosis not present

## 2011-06-17 DIAGNOSIS — L02419 Cutaneous abscess of limb, unspecified: Secondary | ICD-10-CM | POA: Insufficient documentation

## 2011-06-17 DIAGNOSIS — S88119A Complete traumatic amputation at level between knee and ankle, unspecified lower leg, initial encounter: Secondary | ICD-10-CM | POA: Diagnosis not present

## 2011-06-17 DIAGNOSIS — M1710 Unilateral primary osteoarthritis, unspecified knee: Secondary | ICD-10-CM | POA: Diagnosis not present

## 2011-06-17 DIAGNOSIS — L989 Disorder of the skin and subcutaneous tissue, unspecified: Secondary | ICD-10-CM | POA: Insufficient documentation

## 2011-06-17 DIAGNOSIS — E669 Obesity, unspecified: Secondary | ICD-10-CM | POA: Diagnosis not present

## 2011-06-17 DIAGNOSIS — M069 Rheumatoid arthritis, unspecified: Secondary | ICD-10-CM | POA: Insufficient documentation

## 2011-06-17 DIAGNOSIS — F341 Dysthymic disorder: Secondary | ICD-10-CM | POA: Diagnosis not present

## 2011-06-17 DIAGNOSIS — M869 Osteomyelitis, unspecified: Secondary | ICD-10-CM

## 2011-06-17 NOTE — Assessment & Plan Note (Signed)
Crystal Fitzpatrick is back regarding her multiple pain complaints.  She complains of abrasion/cut over the left gluteal region near the interface with the thigh.  She has had some chronic issues with the left lower extremity, which has been generally stable.  She has been working on losing weight and has lost about 35 pounds.  She had recent cataract surgery.  She wants to get to the point where she can work with her prosthesis again. She has questions about what she can do to control edema in her legs. She tells me that she was given some antifungal cream for the gluteal breakdown and the area has been there for about a year or so.  She feels that it may be associated with her toilet seat.  She also tells me that she does a lot of seated exercising.  REVIEW OF SYSTEMS:  Notable for the above.  Full 12-point review is in the written health and history section of the chart.  SOCIAL HISTORY:  Unchanged.  PHYSICAL EXAMINATION:  VITAL SIGNS:  Blood pressure is 137/65, pulse is 79, respiratory rate 18, and she is saturating 94% on room air. GENERAL:  The patient is pleasant, alert. MUSCULOSKELETAL:  Her left lower extremity is notable for chronic venous changes.  There is an old area of breakdown that seems to be healing. There is no active cellulitis, although the area is slightly red and hyperpigmented.  She has reasonable strength throughout all compartments of the left lower extremity.  She is a bit limited with range of motion due to the edema on the leg.  There is minimal pain I felt today on the left leg, however.  Left gluteal region was seen.  There is a small area of breakdown with associated erythema which I grade small stage II in character.  Right BKA site is clean and intact with shrinker.  She has only had the shrinker flared once today.  Cognitively, she is alert, appropriate.  She does not appear to have lost some weight. HEART:  Regular. CHEST:  Clear. ABDOMEN:  Soft and  nontender.  ASSESSMENT: 1. Right below-knee amputation, history of phantom limb pain. 2. Peripheral neuropathy. 3. History of rheumatoid arthritis. 4. Osteoarthritis of left knee. 5. Chronic cellulitis, left leg with associated edema. 6. Left gluteal breakdown.  PLAN: 1. Continue compression stocking to right leg.  I encouraged double     wrapping the distal portions to encourage increased edema control. 2. Regarding her left leg.  Apparently, she has gone through some Unna     dressings.  I would try some Ace dressings at night to help     compress the leg.  She also needs to work on elevation of the limb     and further weight loss should help as well. 3. Regarding her gluteal region, I recommended extra protective     Barrier cream.  She may do well with a removable protective     dressing as well such as MeroPack or Allevyn.  We will start with a     Barrier cream first.  It does not appear to be a fungal infection     and the wound in fact is fairly small.  I think she takes care to     avoid irritation with daily activities that the area should heal. 4. Refilled her pain medications today including Percocet 10/325, #130     and fentanyl 75 mcg #10.  She may want to try a different brand of  patch such as Mylan which may not cause much irritation for her. 5. We will send home therapy out to the house to work on preprosthetic     gait training and exercising.  I think if she is serious about her     weight loss and exercise regimen that prosthetic training could be     in her future.     Hopefully, she will experience no setbacks in the near future. 6. I will see her back in about a month.  I will have her follow up     with me in about 2-3 months' time.     [name], M.D. Electronically Signed    ZTS/MedQ D:  06/17/2011 11:46:36  T:  06/17/2011 12:16:51  Job #:  [id number]  cc:   Corwin Levins, MD [address] [place] [address]

## 2011-06-19 ENCOUNTER — Ambulatory Visit (INDEPENDENT_AMBULATORY_CARE_PROVIDER_SITE_OTHER): Payer: Medicare Other | Admitting: Internal Medicine

## 2011-06-19 ENCOUNTER — Encounter: Payer: Self-pay | Admitting: Internal Medicine

## 2011-06-19 VITALS — BP 114/60 | HR 83 | Temp 98.6°F

## 2011-06-19 DIAGNOSIS — L03119 Cellulitis of unspecified part of limb: Secondary | ICD-10-CM

## 2011-06-19 DIAGNOSIS — I509 Heart failure, unspecified: Secondary | ICD-10-CM | POA: Diagnosis not present

## 2011-06-19 DIAGNOSIS — L02419 Cutaneous abscess of limb, unspecified: Secondary | ICD-10-CM

## 2011-06-19 DIAGNOSIS — I10 Essential (primary) hypertension: Secondary | ICD-10-CM

## 2011-06-19 DIAGNOSIS — L03116 Cellulitis of left lower limb: Secondary | ICD-10-CM

## 2011-06-19 DIAGNOSIS — E119 Type 2 diabetes mellitus without complications: Secondary | ICD-10-CM | POA: Diagnosis not present

## 2011-06-19 MED ORDER — CEPHALEXIN 500 MG PO CAPS: 500.0000 mg | ORAL_CAPSULE | Freq: Four times a day (QID) | ORAL | Status: AC

## 2011-06-19 NOTE — Patient Instructions (Addendum)
Take all new medications as prescribed Continue all other medications as before Please return in 6 months, or sooner if needed 

## 2011-06-20 ENCOUNTER — Encounter: Payer: Self-pay | Admitting: Internal Medicine

## 2011-06-20 NOTE — Assessment & Plan Note (Signed)
stable overall by hx and exam, most recent data reviewed with pt, and pt to continue medical treatment as before  BP Readings from Last 3 Encounters:  06/19/11 114/60  05/29/11 136/78  03/30/11 110/60

## 2011-06-20 NOTE — Progress Notes (Signed)
Subjective:    Patient ID: Crystal Fitzpatrick, female    DOB: 10/20/1945, 66 y.o.   MRN: [id number]  HPI  Here to f/u; c/o mild to mod 3 days distal LLE area red, tender, swelling without fever, n/v, red streaks, or chills but with fatigue.  Peak circumference at 6 in below knee has been 23 inches in the past, with recent baseline 15 inch, now back up to 20 in 2 days ago. Also with recnet right ear eustachian valve popping and crackling.  Is set up get [facility] are for further PT per Dr Hermelinda Medicus per pt.  Has a difficult to heal sore left buttock, though to be due to irritation and doing PT motions with leg while seated.  Pt denies chest pain, increased sob or doe, wheezing, orthopnea, PND, increased LE swelling, palpitations, dizziness or syncope.  Pt denies new neurological symptoms such as new headache, or facial or extremity weakness or numbness   Pt denies polydipsia, polyuria, or low sugars.  Pt states overall good compliance with meds, trying to follow lower cholesterol, diabetic diet. Past Medical History  Diagnosis Date  . DIABETES MELLITUS, TYPE II 07/17/2008  . B12 DEFICIENCY 07/17/2008  . Acute gouty arthropathy 12/11/2008  . HYPONATREMIA 03/20/2010  . HYPERKALEMIA 10/31/2009  . ANEMIA-NOS 07/17/2008  . ANXIETY 07/17/2008  . Chronic pain syndrome 02/14/2010  . Trigeminal neuralgia 02/14/2010  . HEARING LOSS, RIGHT EAR 06/04/2009  . HYPERTENSION 07/17/2008  . CHF 01/28/2009  . Pneumonia, organism unspecified 02/14/2010  . GERD 07/17/2008  . IBS 06/04/2009  . UNSPECIFIED HEPATITIS 01/28/2009  . RENAL INSUFFICIENCY 10/31/2009  . MENOPAUSAL DISORDER 12/03/2009  . Cellulitis and abscess of leg, except foot 11/15/2008  . SKIN ULCER, CHRONIC 02/08/2009  . Rheumatoid arthritis 07/17/2008  . SKIN LESION 06/04/2009  . ARTHRITIS 01/28/2009  . FOOT PAIN 07/17/2008  . HYPERSOMNIA 02/14/2010  . Altered mental status 02/07/2010  . PERIPHERAL EDEMA 08/30/2008  . Wheezing 12/03/2009  . ABDOMINAL DISTENSION 10/11/2008  .  Dysuria 11/11/2009  . Hypoxemia 03/20/2010  . ANAPHYLACTIC SHOCK 05/15/2009  . NEUROPATHY, HX OF 01/28/2009  . COLONIC POLYPS, HX OF 07/17/2008  . AMPUTATION, BELOW KNEE, RIGHT, HX OF 01/28/2009  . Hyperlipemia 09/24/2010  . Hyperlipidemia 09/24/2010  . Seizure 10/26/2010   Past Surgical History  Procedure Date  . Amputation 02/02/2008    below right knee  . S/p egd 2007    with Botox for? Achalasia  . Tonsillectomy   . S/p breast biopsy 1992  . 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 Tubal ligation   . Nasal septum surgery   . Foot surgury 1980 and 1981    reports that she has quit smoking. She does not have any smokeless tobacco history on file. She reports that she does not drink alcohol or use illicit drugs. family history includes Breast cancer in her sister; Diabetes in her other; and Stroke in her other. Allergies  Allergen Reactions  . Ciprofloxacin   . Doxycycline     Nausea, diarrhea  . Erythromycin   . Morphine   . Nitrofurantoin     REACTION: itch  . Sulfonamide Derivatives    Current Outpatient Prescriptions on File Prior to Visit  Medication Sig Dispense Refill  . albuterol (PROAIR HFA) 108 (90 BASE) MCG/ACT inhaler Inhale 2 puffs into the lungs every 6 (six) hours as needed.        . ALPRAZolam (XANAX) 0.25 MG tablet Take 1 tablet (0.25 mg  total) by mouth 3 (three) times daily as needed.  90 tablet  2  . Ascorbic Acid (VITAMIN C) 500 MG tablet Take 500 mg by mouth daily.        [name] atorvastatin (LIPITOR) 10 MG tablet Take 1 tablet (10 mg total) by mouth daily.  90 tablet  3  . BENICAR 40 MG tablet TAKE 1 TABLET BY MOUTH EVERY DAY FOR    BLOOD PRESSURE  30 tablet  2  . Blood Glucose Monitoring Suppl (ONE TOUCH ULTRA 2) W/DEVICE KIT 1 Device by Does not apply route once.  1 each  0  . Blood Glucose Monitoring Suppl (ONE TOUCH ULTRA SYSTEM KIT) W/DEVICE KIT 1 kit by Does not apply route once.  1 each  0  . Cholecalciferol (VITAMIN D) 400 UNITS capsule Take 400 Units  by mouth daily.        . clotrimazole-betamethasone (LOTRISONE) cream Use as directed twice per day as needed  15 g  1  . cyclobenzaprine (FLEXERIL) 10 MG tablet Take 10 mg by mouth every 8 (eight) hours as needed.      . diphenoxylate-atropine (LOMOTIL) 2.5-0.025 MG per tablet TAKE 1 TABLET BY MOUTH 2 TIMES A DAY    AS NEEDED  30 tablet  1  . DULoxetine (CYMBALTA) 60 MG capsule Take 60 mg by mouth 2 (two) times daily.        . fentaNYL (DURAGESIC - DOSED MCG/HR) 75 MCG/HR Place 1 patch onto the skin. 1 patch every 3 days       . furosemide (LASIX) 40 MG tablet Take 1 tablet (40 mg total) by mouth 2 (two) times daily.  60 tablet  11  . gabapentin (NEURONTIN) 300 MG capsule Take 1 capsule (300 mg total) by mouth 3 (three) times daily. 1 by mouth in the AM, 1/2 by mouth in the afternoon, then 2 by mouth in the PM.      . glucose blood (ONE TOUCH ULTRA TEST) test strip PRN  100 each  5  . lidocaine (LIDODERM) 5 % Place 1 patch onto the skin. Remove & Discard patch within 12 hours or as directed by MD       . loperamide (ANTI-DIARRHEAL) 2 MG tablet Take 2 mg by mouth 2 (two) times daily as needed.        . Multiple Vitamins-Minerals (CENTRUM SILVER ULTRA WOMENS PO) Take by mouth daily.        . Omega-3 Fatty Acids (FISH OIL) 1000 MG CAPS Take by mouth daily.        Letta Pate DELICA LANCETS MISC Use as directed to test blood sugar dx 250.00  100 each  2  . oxyCODONE-acetaminophen (PERCOCET) 10-325 MG per tablet Take 1 tablet by mouth every 6 (six) hours as needed.        . pantoprazole (PROTONIX) 40 MG tablet Take 40 mg by mouth daily.        . promethazine (PHENERGAN) 25 MG tablet TAKE ONE TABLET BY MOUTH EVERY 6 HOURS  AS NEEDED NAUSEA AND VOMITTING  40 tablet  1  . Psyllium (METAMUCIL) 30.9 % POWD Take by mouth. Take as directed       . topiramate (TOPAMAX) 100 MG tablet Take 100 mg by mouth 3 (three) times daily.         Review of Systems Review of Systems  Constitutional: Negative for  diaphoresis and unexpected weight change.  HENT: Negative for drooling and tinnitus.   Eyes: Negative for  photophobia and visual disturbance.  Respiratory: Negative for choking and stridor.   Gastrointestinal: Negative for vomiting and blood in stool.  Genitourinary: Negative for hematuria and decreased urine volume. .       Objective:   Physical Exam BP 114/60  Pulse 83  Temp(Src) 98.6 F (37 C) (Oral) Physical Exam  VS noted Constitutional: Pt appears well-developed and well-nourished.  HENT: Head: Normocephalic.  Right Ear: External ear normal.  Left Ear: External ear normal.  Eyes: Conjunctivae and EOM are normal. Pupils are equal, round, and reactive to light.  Neck: Normal range of motion. Neck supple.  Cardiovascular: Normal rate and regular rhythm.   Pulmonary/Chest: Effort normal and breath sounds normal.  Abd:  Soft, NT, non-distended, + BS Neurological: Pt is alert. No cranial nerve deficit.  Skin: Skin is warm. No erythema. except for 4x6 cm area distal/medial LLE with erythema/sweling/tender but no open sore or drainage. Psychiatric: Pt behavior is normal. Thought content normal. 1+ ner ous     Assessment & Plan:

## 2011-06-20 NOTE — Assessment & Plan Note (Signed)
stable overall by hx and exam, most recent data reviewed with pt, and pt to continue medical treatment as before  Lab Results  Component Value Date   HGBA1C 6.3 03/30/2011    

## 2011-06-20 NOTE — Assessment & Plan Note (Signed)
Mild to mod, for antibx course,  to f/u any worsening symptoms or concerns 

## 2011-06-20 NOTE — Assessment & Plan Note (Signed)
stable overall by hx and exam, most recent data reviewed with pt, and pt to continue medical treatment as before  Lab Results  Component Value Date   WBC 5.9 12/08/2010   HGB 10.5* 12/08/2010   HCT 30.9* 12/08/2010   PLT 237.0 12/08/2010   GLUCOSE 86 03/30/2011   CHOL 210* 03/30/2011   TRIG 246.0* 03/30/2011   HDL 39.00* 03/30/2011   LDLDIRECT 147.8 03/30/2011   LDLCALC  Value: 104        Total Cholesterol/HDL:CHD Risk Coronary Heart Disease Risk Table                     Men   Women  1/2 Average Risk   3.4   3.3  Average Risk       5.0   4.4  2 X Average Risk   9.6   7.1  3 X Average Risk  23.4   11.0        Use the calculated Patient Ratio above and the CHD Risk Table to determine the patient's CHD Risk.        ATP III CLASSIFICATION (LDL):  <100     mg/dL   Optimal  147-829  mg/dL   Near or Above                    Optimal  130-159  mg/dL   Borderline  562-130  mg/dL   High  >865     mg/dL   Very High* 7/84/6962   ALT 9 02/08/2010   AST 7 02/08/2010   NA 135 03/30/2011   K 4.2 03/30/2011   CL 102 03/30/2011   CREATININE 1.3* 03/30/2011   BUN 20 03/30/2011   CO2 25 03/30/2011   TSH 0.625 02/08/2010   INR 1.04 02/08/2010   HGBA1C 6.3 03/30/2011   MICROALBUR 2.1* 12/03/2009

## 2011-06-21 DIAGNOSIS — E119 Type 2 diabetes mellitus without complications: Secondary | ICD-10-CM | POA: Diagnosis not present

## 2011-06-21 DIAGNOSIS — G609 Hereditary and idiopathic neuropathy, unspecified: Secondary | ICD-10-CM | POA: Diagnosis not present

## 2011-06-21 DIAGNOSIS — R569 Unspecified convulsions: Secondary | ICD-10-CM | POA: Diagnosis not present

## 2011-06-21 DIAGNOSIS — IMO0001 Reserved for inherently not codable concepts without codable children: Secondary | ICD-10-CM | POA: Diagnosis not present

## 2011-06-21 DIAGNOSIS — I10 Essential (primary) hypertension: Secondary | ICD-10-CM | POA: Diagnosis not present

## 2011-06-21 DIAGNOSIS — F329 Major depressive disorder, single episode, unspecified: Secondary | ICD-10-CM | POA: Diagnosis not present

## 2011-06-25 DIAGNOSIS — G609 Hereditary and idiopathic neuropathy, unspecified: Secondary | ICD-10-CM | POA: Diagnosis not present

## 2011-06-25 DIAGNOSIS — I10 Essential (primary) hypertension: Secondary | ICD-10-CM | POA: Diagnosis not present

## 2011-06-25 DIAGNOSIS — E119 Type 2 diabetes mellitus without complications: Secondary | ICD-10-CM | POA: Diagnosis not present

## 2011-06-25 DIAGNOSIS — F329 Major depressive disorder, single episode, unspecified: Secondary | ICD-10-CM | POA: Diagnosis not present

## 2011-06-25 DIAGNOSIS — R569 Unspecified convulsions: Secondary | ICD-10-CM | POA: Diagnosis not present

## 2011-06-25 DIAGNOSIS — IMO0001 Reserved for inherently not codable concepts without codable children: Secondary | ICD-10-CM | POA: Diagnosis not present

## 2011-06-26 DIAGNOSIS — IMO0001 Reserved for inherently not codable concepts without codable children: Secondary | ICD-10-CM | POA: Diagnosis not present

## 2011-06-26 DIAGNOSIS — E119 Type 2 diabetes mellitus without complications: Secondary | ICD-10-CM | POA: Diagnosis not present

## 2011-06-26 DIAGNOSIS — F329 Major depressive disorder, single episode, unspecified: Secondary | ICD-10-CM | POA: Diagnosis not present

## 2011-06-26 DIAGNOSIS — I10 Essential (primary) hypertension: Secondary | ICD-10-CM | POA: Diagnosis not present

## 2011-06-26 DIAGNOSIS — G609 Hereditary and idiopathic neuropathy, unspecified: Secondary | ICD-10-CM | POA: Diagnosis not present

## 2011-06-26 DIAGNOSIS — R569 Unspecified convulsions: Secondary | ICD-10-CM | POA: Diagnosis not present

## 2011-06-29 DIAGNOSIS — R569 Unspecified convulsions: Secondary | ICD-10-CM | POA: Diagnosis not present

## 2011-06-29 DIAGNOSIS — I10 Essential (primary) hypertension: Secondary | ICD-10-CM | POA: Diagnosis not present

## 2011-06-29 DIAGNOSIS — G609 Hereditary and idiopathic neuropathy, unspecified: Secondary | ICD-10-CM | POA: Diagnosis not present

## 2011-06-29 DIAGNOSIS — IMO0001 Reserved for inherently not codable concepts without codable children: Secondary | ICD-10-CM | POA: Diagnosis not present

## 2011-06-29 DIAGNOSIS — F329 Major depressive disorder, single episode, unspecified: Secondary | ICD-10-CM | POA: Diagnosis not present

## 2011-06-29 DIAGNOSIS — E119 Type 2 diabetes mellitus without complications: Secondary | ICD-10-CM | POA: Diagnosis not present

## 2011-07-01 DIAGNOSIS — E119 Type 2 diabetes mellitus without complications: Secondary | ICD-10-CM | POA: Diagnosis not present

## 2011-07-01 DIAGNOSIS — IMO0001 Reserved for inherently not codable concepts without codable children: Secondary | ICD-10-CM | POA: Diagnosis not present

## 2011-07-01 DIAGNOSIS — I10 Essential (primary) hypertension: Secondary | ICD-10-CM | POA: Diagnosis not present

## 2011-07-01 DIAGNOSIS — F329 Major depressive disorder, single episode, unspecified: Secondary | ICD-10-CM | POA: Diagnosis not present

## 2011-07-01 DIAGNOSIS — G609 Hereditary and idiopathic neuropathy, unspecified: Secondary | ICD-10-CM | POA: Diagnosis not present

## 2011-07-01 DIAGNOSIS — R569 Unspecified convulsions: Secondary | ICD-10-CM | POA: Diagnosis not present

## 2011-07-06 DIAGNOSIS — R569 Unspecified convulsions: Secondary | ICD-10-CM | POA: Diagnosis not present

## 2011-07-06 DIAGNOSIS — E119 Type 2 diabetes mellitus without complications: Secondary | ICD-10-CM | POA: Diagnosis not present

## 2011-07-06 DIAGNOSIS — I10 Essential (primary) hypertension: Secondary | ICD-10-CM | POA: Diagnosis not present

## 2011-07-06 DIAGNOSIS — G609 Hereditary and idiopathic neuropathy, unspecified: Secondary | ICD-10-CM | POA: Diagnosis not present

## 2011-07-06 DIAGNOSIS — F329 Major depressive disorder, single episode, unspecified: Secondary | ICD-10-CM | POA: Diagnosis not present

## 2011-07-06 DIAGNOSIS — IMO0001 Reserved for inherently not codable concepts without codable children: Secondary | ICD-10-CM | POA: Diagnosis not present

## 2011-07-08 ENCOUNTER — Telehealth: Payer: Self-pay | Admitting: *Deleted

## 2011-07-08 NOTE — Telephone Encounter (Signed)
Developed a tear on buttock. Impairing PT. Can he get a verbal to get [facility] RN out to asses wound? Dr. Riley Kill was consulted on this and he said the verbal order is fine. This is not something new, he saw this tear at her last OV.

## 2011-07-09 ENCOUNTER — Other Ambulatory Visit: Payer: Self-pay | Admitting: Internal Medicine

## 2011-07-09 DIAGNOSIS — G609 Hereditary and idiopathic neuropathy, unspecified: Secondary | ICD-10-CM | POA: Diagnosis not present

## 2011-07-09 DIAGNOSIS — E119 Type 2 diabetes mellitus without complications: Secondary | ICD-10-CM | POA: Diagnosis not present

## 2011-07-09 DIAGNOSIS — I10 Essential (primary) hypertension: Secondary | ICD-10-CM | POA: Diagnosis not present

## 2011-07-09 DIAGNOSIS — R569 Unspecified convulsions: Secondary | ICD-10-CM | POA: Diagnosis not present

## 2011-07-09 DIAGNOSIS — IMO0001 Reserved for inherently not codable concepts without codable children: Secondary | ICD-10-CM | POA: Diagnosis not present

## 2011-07-09 DIAGNOSIS — F329 Major depressive disorder, single episode, unspecified: Secondary | ICD-10-CM | POA: Diagnosis not present

## 2011-07-09 NOTE — Telephone Encounter (Signed)
Faxed hardcopy to pharmacy. 

## 2011-07-10 ENCOUNTER — Telehealth: Payer: Self-pay | Admitting: Physical Medicine & Rehabilitation

## 2011-07-10 ENCOUNTER — Other Ambulatory Visit: Payer: Self-pay

## 2011-07-10 DIAGNOSIS — E119 Type 2 diabetes mellitus without complications: Secondary | ICD-10-CM | POA: Diagnosis not present

## 2011-07-10 DIAGNOSIS — Z0271 Encounter for disability determination: Secondary | ICD-10-CM | POA: Diagnosis not present

## 2011-07-10 DIAGNOSIS — R569 Unspecified convulsions: Secondary | ICD-10-CM | POA: Diagnosis not present

## 2011-07-10 DIAGNOSIS — G609 Hereditary and idiopathic neuropathy, unspecified: Secondary | ICD-10-CM | POA: Diagnosis not present

## 2011-07-10 DIAGNOSIS — IMO0001 Reserved for inherently not codable concepts without codable children: Secondary | ICD-10-CM | POA: Diagnosis not present

## 2011-07-10 DIAGNOSIS — F329 Major depressive disorder, single episode, unspecified: Secondary | ICD-10-CM | POA: Diagnosis not present

## 2011-07-10 DIAGNOSIS — I10 Essential (primary) hypertension: Secondary | ICD-10-CM | POA: Diagnosis not present

## 2011-07-10 MED ORDER — EPINEPHRINE 0.3 MG/0.3ML IJ DEVI: 0.3000 mg | Freq: Once | INTRAMUSCULAR | Status: DC

## 2011-07-10 NOTE — Telephone Encounter (Signed)
[place](?) patch-has an allergy to glue, now having a reaction to patch.  Pharmacy only carries [place](?).  Cannot use, not sticking.  Has 1 box not opened.

## 2011-07-13 ENCOUNTER — Encounter: Payer: Self-pay | Admitting: *Deleted

## 2011-07-13 ENCOUNTER — Encounter: Payer: Medicare Other | Attending: Physical Medicine & Rehabilitation | Admitting: *Deleted

## 2011-07-13 VITALS — BP 104/44 | HR 82 | Resp 18 | Ht 70.0 in | Wt 217.0 lb

## 2011-07-13 DIAGNOSIS — E669 Obesity, unspecified: Secondary | ICD-10-CM

## 2011-07-13 DIAGNOSIS — M869 Osteomyelitis, unspecified: Secondary | ICD-10-CM

## 2011-07-13 DIAGNOSIS — G609 Hereditary and idiopathic neuropathy, unspecified: Secondary | ICD-10-CM | POA: Diagnosis not present

## 2011-07-13 DIAGNOSIS — L02419 Cutaneous abscess of limb, unspecified: Secondary | ICD-10-CM | POA: Insufficient documentation

## 2011-07-13 DIAGNOSIS — G547 Phantom limb syndrome without pain: Secondary | ICD-10-CM | POA: Diagnosis not present

## 2011-07-13 DIAGNOSIS — F341 Dysthymic disorder: Secondary | ICD-10-CM

## 2011-07-13 DIAGNOSIS — L988 Other specified disorders of the skin and subcutaneous tissue: Secondary | ICD-10-CM | POA: Insufficient documentation

## 2011-07-13 DIAGNOSIS — M069 Rheumatoid arthritis, unspecified: Secondary | ICD-10-CM | POA: Diagnosis not present

## 2011-07-13 DIAGNOSIS — M1710 Unilateral primary osteoarthritis, unspecified knee: Secondary | ICD-10-CM | POA: Insufficient documentation

## 2011-07-13 DIAGNOSIS — S88119A Complete traumatic amputation at level between knee and ankle, unspecified lower leg, initial encounter: Secondary | ICD-10-CM | POA: Diagnosis not present

## 2011-07-13 MED ORDER — FENTANYL 75 MCG/HR TD PT72: 1.0000 | MEDICATED_PATCH | TRANSDERMAL | Status: DC

## 2011-07-13 MED ORDER — OXYCODONE-ACETAMINOPHEN 10-325 MG PO TABS: 1.0000 | ORAL_TABLET | Freq: Four times a day (QID) | ORAL | Status: DC | PRN

## 2011-07-13 NOTE — Telephone Encounter (Signed)
Pt states that the patches are now sticking well and aren't causing her any problems. She thinks that she may have "got a hold of a bad batch." She is coming in today to see Dr. Riley Kill, she is going to discuss this with him today.

## 2011-07-13 NOTE — Progress Notes (Signed)
Home care therapist aggressive, now c/o "pinched nerve" in left shoulder. Fentanyl patches causing skin irritation. Will call pharmacies to find one that carries a different brand. Skin tear on buttock being treated.

## 2011-07-14 DIAGNOSIS — G609 Hereditary and idiopathic neuropathy, unspecified: Secondary | ICD-10-CM | POA: Diagnosis not present

## 2011-07-14 DIAGNOSIS — R569 Unspecified convulsions: Secondary | ICD-10-CM | POA: Diagnosis not present

## 2011-07-14 DIAGNOSIS — F329 Major depressive disorder, single episode, unspecified: Secondary | ICD-10-CM | POA: Diagnosis not present

## 2011-07-14 DIAGNOSIS — I10 Essential (primary) hypertension: Secondary | ICD-10-CM | POA: Diagnosis not present

## 2011-07-14 DIAGNOSIS — E119 Type 2 diabetes mellitus without complications: Secondary | ICD-10-CM | POA: Diagnosis not present

## 2011-07-14 DIAGNOSIS — IMO0001 Reserved for inherently not codable concepts without codable children: Secondary | ICD-10-CM | POA: Diagnosis not present

## 2011-07-15 DIAGNOSIS — IMO0001 Reserved for inherently not codable concepts without codable children: Secondary | ICD-10-CM | POA: Diagnosis not present

## 2011-07-15 DIAGNOSIS — I10 Essential (primary) hypertension: Secondary | ICD-10-CM | POA: Diagnosis not present

## 2011-07-15 DIAGNOSIS — G609 Hereditary and idiopathic neuropathy, unspecified: Secondary | ICD-10-CM | POA: Diagnosis not present

## 2011-07-15 DIAGNOSIS — R569 Unspecified convulsions: Secondary | ICD-10-CM | POA: Diagnosis not present

## 2011-07-15 DIAGNOSIS — E119 Type 2 diabetes mellitus without complications: Secondary | ICD-10-CM | POA: Diagnosis not present

## 2011-07-15 DIAGNOSIS — F329 Major depressive disorder, single episode, unspecified: Secondary | ICD-10-CM | POA: Diagnosis not present

## 2011-07-17 DIAGNOSIS — E119 Type 2 diabetes mellitus without complications: Secondary | ICD-10-CM | POA: Diagnosis not present

## 2011-07-17 DIAGNOSIS — R569 Unspecified convulsions: Secondary | ICD-10-CM | POA: Diagnosis not present

## 2011-07-17 DIAGNOSIS — I10 Essential (primary) hypertension: Secondary | ICD-10-CM | POA: Diagnosis not present

## 2011-07-17 DIAGNOSIS — F329 Major depressive disorder, single episode, unspecified: Secondary | ICD-10-CM | POA: Diagnosis not present

## 2011-07-17 DIAGNOSIS — IMO0001 Reserved for inherently not codable concepts without codable children: Secondary | ICD-10-CM | POA: Diagnosis not present

## 2011-07-17 DIAGNOSIS — G609 Hereditary and idiopathic neuropathy, unspecified: Secondary | ICD-10-CM | POA: Diagnosis not present

## 2011-07-21 ENCOUNTER — Other Ambulatory Visit: Payer: Self-pay | Admitting: *Deleted

## 2011-07-21 DIAGNOSIS — R569 Unspecified convulsions: Secondary | ICD-10-CM | POA: Diagnosis not present

## 2011-07-21 DIAGNOSIS — IMO0001 Reserved for inherently not codable concepts without codable children: Secondary | ICD-10-CM | POA: Diagnosis not present

## 2011-07-21 DIAGNOSIS — G609 Hereditary and idiopathic neuropathy, unspecified: Secondary | ICD-10-CM | POA: Diagnosis not present

## 2011-07-21 DIAGNOSIS — I10 Essential (primary) hypertension: Secondary | ICD-10-CM | POA: Diagnosis not present

## 2011-07-21 DIAGNOSIS — E119 Type 2 diabetes mellitus without complications: Secondary | ICD-10-CM | POA: Diagnosis not present

## 2011-07-21 DIAGNOSIS — F329 Major depressive disorder, single episode, unspecified: Secondary | ICD-10-CM | POA: Diagnosis not present

## 2011-07-21 MED ORDER — TOPIRAMATE 100 MG PO TABS: 300.0000 mg | ORAL_TABLET | Freq: Every day | ORAL | Status: DC

## 2011-07-27 ENCOUNTER — Ambulatory Visit: Payer: Medicare Other | Admitting: Internal Medicine

## 2011-07-28 ENCOUNTER — Ambulatory Visit: Payer: Medicare Other | Admitting: Internal Medicine

## 2011-07-29 ENCOUNTER — Ambulatory Visit: Payer: Medicare Other | Admitting: Internal Medicine

## 2011-07-29 ENCOUNTER — Telehealth: Payer: Self-pay

## 2011-07-29 MED ORDER — CEPHALEXIN 500 MG PO CAPS: 500.0000 mg | ORAL_CAPSULE | Freq: Four times a day (QID) | ORAL | Status: AC

## 2011-07-29 NOTE — Telephone Encounter (Signed)
Ok this time only 

## 2011-07-29 NOTE — Telephone Encounter (Signed)
The patient called to inform she was unable to come in today for her appointment due to diarrhea (making it difficult to leave her home). She was scheduled to be seen today for cough and congestion, she would like an antibiotic sent in if possible? Also, mentioned she had canceled a previous appointment due to heavy rain and too difficult to navigate her wheel chair. Please advise

## 2011-07-30 NOTE — Telephone Encounter (Signed)
Patient informed prescription requested has been sent in. 

## 2011-08-06 ENCOUNTER — Encounter: Payer: Self-pay | Admitting: Physical Medicine & Rehabilitation

## 2011-08-12 ENCOUNTER — Encounter: Payer: Medicare Other | Attending: Physical Medicine & Rehabilitation | Admitting: *Deleted

## 2011-08-12 ENCOUNTER — Encounter: Payer: Self-pay | Admitting: *Deleted

## 2011-08-12 VITALS — BP 125/53 | HR 88 | Resp 18

## 2011-08-12 DIAGNOSIS — G547 Phantom limb syndrome without pain: Secondary | ICD-10-CM | POA: Diagnosis not present

## 2011-08-12 DIAGNOSIS — M1710 Unilateral primary osteoarthritis, unspecified knee: Secondary | ICD-10-CM | POA: Diagnosis not present

## 2011-08-12 DIAGNOSIS — G894 Chronic pain syndrome: Secondary | ICD-10-CM

## 2011-08-12 DIAGNOSIS — S88119A Complete traumatic amputation at level between knee and ankle, unspecified lower leg, initial encounter: Secondary | ICD-10-CM | POA: Insufficient documentation

## 2011-08-12 DIAGNOSIS — M069 Rheumatoid arthritis, unspecified: Secondary | ICD-10-CM | POA: Diagnosis not present

## 2011-08-12 DIAGNOSIS — G609 Hereditary and idiopathic neuropathy, unspecified: Secondary | ICD-10-CM | POA: Diagnosis not present

## 2011-08-12 MED ORDER — FENTANYL 75 MCG/HR TD PT72: 1.0000 | MEDICATED_PATCH | TRANSDERMAL | Status: DC

## 2011-08-12 MED ORDER — OXYCODONE-ACETAMINOPHEN 10-325 MG PO TABS: 1.0000 | ORAL_TABLET | Freq: Four times a day (QID) | ORAL | Status: DC | PRN

## 2011-08-12 NOTE — Progress Notes (Signed)
Reports today that her left leg is infected. Lower leg edematous, reddened, shiny skin with small areas of oozing. States was odorous today before cleaning it up. Circumference is 19", down from 22" a few days ago. Normal for her is 15.5in. Requesting HHRN from [facility] or [facility], and/or referral to [facility]. Requesting antibiotic Rx. Afraid of losing her let. Is trying to get worked in at Dr [employer] office tomorrow. She will let us know if she does. Her new brand of fentanyl patch is adhering well.

## 2011-08-13 ENCOUNTER — Ambulatory Visit (INDEPENDENT_AMBULATORY_CARE_PROVIDER_SITE_OTHER): Payer: Medicare Other | Admitting: Internal Medicine

## 2011-08-13 ENCOUNTER — Encounter: Payer: Self-pay | Admitting: Internal Medicine

## 2011-08-13 ENCOUNTER — Encounter: Payer: Self-pay | Admitting: Physical Medicine & Rehabilitation

## 2011-08-13 VITALS — BP 120/62 | HR 86 | Temp 98.1°F

## 2011-08-13 DIAGNOSIS — S81802A Unspecified open wound, left lower leg, initial encounter: Secondary | ICD-10-CM | POA: Insufficient documentation

## 2011-08-13 DIAGNOSIS — S81009A Unspecified open wound, unspecified knee, initial encounter: Secondary | ICD-10-CM | POA: Diagnosis not present

## 2011-08-13 DIAGNOSIS — L02419 Cutaneous abscess of limb, unspecified: Secondary | ICD-10-CM | POA: Diagnosis not present

## 2011-08-13 DIAGNOSIS — I10 Essential (primary) hypertension: Secondary | ICD-10-CM

## 2011-08-13 DIAGNOSIS — L03116 Cellulitis of left lower limb: Secondary | ICD-10-CM

## 2011-08-13 DIAGNOSIS — L03119 Cellulitis of unspecified part of limb: Secondary | ICD-10-CM | POA: Diagnosis not present

## 2011-08-13 DIAGNOSIS — E119 Type 2 diabetes mellitus without complications: Secondary | ICD-10-CM

## 2011-08-13 DIAGNOSIS — S91009A Unspecified open wound, unspecified ankle, initial encounter: Secondary | ICD-10-CM | POA: Diagnosis not present

## 2011-08-13 MED ORDER — PROMETHAZINE HCL 25 MG PO TABS: 25.0000 mg | ORAL_TABLET | Freq: Four times a day (QID) | ORAL | Status: DC | PRN

## 2011-08-13 MED ORDER — CLINDAMYCIN HCL 150 MG PO CAPS: 150.0000 mg | ORAL_CAPSULE | Freq: Three times a day (TID) | ORAL | Status: AC

## 2011-08-13 MED ORDER — DIPHENOXYLATE-ATROPINE 2.5-0.025 MG PO TABS: ORAL_TABLET | ORAL | Status: DC

## 2011-08-13 NOTE — Patient Instructions (Signed)
Take all new medications as prescribed Continue all other medications as before You will be contacted regarding the referral for: wound clinic Your other refills were done as you requested

## 2011-08-14 ENCOUNTER — Encounter: Payer: Self-pay | Admitting: Internal Medicine

## 2011-08-14 NOTE — Assessment & Plan Note (Signed)
Ok for referral to wound clinic per pt reqeust

## 2011-08-14 NOTE — Progress Notes (Signed)
Subjective:    Patient ID: Crystal Fitzpatrick, female    DOB: 1945-11-19, 66 y.o.   MRN: [id number]  HPI  Here with 3 days onset sudden onset leg swelling, redness, tender below the knee, with most of the tenderness/pain to the distal leg "all around" above the ankle;  No fever, chills , n/v, red streaks, or swelling from the knee or above; Does have the beginning of small ulcer to distal ant leg above the ankle again in the same area that had been previously healed.  Pt denies chest pain, increased sob or doe, wheezing, orthopnea, PND, increased LE swelling, palpitations, dizziness or syncope.  Pt denies new neurological symptoms such as new headache, or facial or extremity weakness or numbness   Pt denies polydipsia, polyuria.   Past Medical History  Diagnosis Date  . DIABETES MELLITUS, TYPE II 07/17/2008  . B12 DEFICIENCY 07/17/2008  . Acute gouty arthropathy 12/11/2008  . HYPONATREMIA 03/20/2010  . HYPERKALEMIA 10/31/2009  . ANEMIA-NOS 07/17/2008  . ANXIETY 07/17/2008  . Chronic pain syndrome 02/14/2010  . Trigeminal neuralgia 02/14/2010  . HEARING LOSS, RIGHT EAR 06/04/2009  . HYPERTENSION 07/17/2008  . CHF 01/28/2009  . Pneumonia, organism unspecified 02/14/2010  . GERD 07/17/2008  . IBS 06/04/2009  . UNSPECIFIED HEPATITIS 01/28/2009  . RENAL INSUFFICIENCY 10/31/2009  . MENOPAUSAL DISORDER 12/03/2009  . Cellulitis and abscess of leg, except foot 11/15/2008  . SKIN ULCER, CHRONIC 02/08/2009  . Rheumatoid arthritis 07/17/2008  . SKIN LESION 06/04/2009  . ARTHRITIS 01/28/2009  . FOOT PAIN 07/17/2008  . HYPERSOMNIA 02/14/2010  . Altered mental status 02/07/2010  . PERIPHERAL EDEMA 08/30/2008  . Wheezing 12/03/2009  . ABDOMINAL DISTENSION 10/11/2008  . Dysuria 11/11/2009  . Hypoxemia 03/20/2010  . ANAPHYLACTIC SHOCK 05/15/2009  . NEUROPATHY, HX OF 01/28/2009  . COLONIC POLYPS, HX OF 07/17/2008  . AMPUTATION, BELOW KNEE, RIGHT, HX OF 01/28/2009  . Hyperlipemia 09/24/2010  . Hyperlipidemia 09/24/2010  . Seizure 10/26/2010    Past Surgical History  Procedure Date  . Amputation 02/02/2008    below right knee  . S/p egd 2007    with Botox for? Achalasia  . Tonsillectomy   . S/p breast biopsy 1992  . 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 Tubal ligation   . Nasal septum surgery   . Foot surgury 1980 and 1981    reports that she has quit smoking. She does not have any smokeless tobacco history on file. She reports that she does not drink alcohol or use illicit drugs. family history includes Breast cancer in her sister; Diabetes in her other; and Stroke in her other. Allergies  Allergen Reactions  . Morphine Anaphylaxis  . Sulfonamide Derivatives Diarrhea  . Ciprofloxacin Diarrhea  . Doxycycline     Nausea, diarrhea  . Nitrofurantoin     REACTION: itch  . Erythromycin Rash   Current Outpatient Prescriptions on File Prior to Visit  Medication Sig Dispense Refill  . albuterol (PROAIR HFA) 108 (90 BASE) MCG/ACT inhaler Inhale 2 puffs into the lungs every 6 (six) hours as needed.        . ALPRAZolam (XANAX) 0.25 MG tablet Take 1 tablet (0.25 mg total) by mouth 3 (three) times daily as needed.  90 tablet  2  . Ascorbic Acid (VITAMIN C) 500 MG tablet Take 500 mg by mouth daily.        [name] atorvastatin (LIPITOR) 10 MG tablet Take 1 tablet (10 mg total) by mouth daily.  90 tablet  3  . BENICAR 40 MG tablet TAKE 1 TABLET BY MOUTH EVERY DAY FOR    BLOOD PRESSURE  30 tablet  2  . Blood Glucose Monitoring Suppl (ONE TOUCH ULTRA 2) W/DEVICE KIT 1 Device by Does not apply route once.  1 each  0  . Blood Glucose Monitoring Suppl (ONE TOUCH ULTRA SYSTEM KIT) W/DEVICE KIT 1 kit by Does not apply route once.  1 each  0  . Cholecalciferol (VITAMIN D) 400 UNITS capsule Take 400 Units by mouth daily.        . clotrimazole-betamethasone (LOTRISONE) cream Use as directed twice per day as needed  15 g  1  . cyclobenzaprine (FLEXERIL) 10 MG tablet Take 10 mg by mouth every 8 (eight) hours as needed.      . DULoxetine  (CYMBALTA) 60 MG capsule Take 60 mg by mouth 2 (two) times daily.        [name] EPINEPHrine (EPIPEN 2-PAK) 0.3 mg/0.3 mL DEVI Inject 0.3 mLs (0.3 mg total) into the muscle once. Use as directed  1 Device  0  . fentaNYL (DURAGESIC - DOSED MCG/HR) 75 MCG/HR Place 1 patch (75 mcg total) onto the skin every 3 (three) days. 1 patch every 3 days  10 patch  0  . furosemide (LASIX) 40 MG tablet Take 1 tablet (40 mg total) by mouth 2 (two) times daily.  60 tablet  11  . gabapentin (NEURONTIN) 300 MG capsule Take 1 capsule (300 mg total) by mouth 3 (three) times daily. 1 by mouth in the AM, 1/2 by mouth in the afternoon, then 2 by mouth in the PM.      . glucose blood (ONE TOUCH ULTRA TEST) test strip PRN  100 each  5  . lidocaine (LIDODERM) 5 % Place 1 patch onto the skin. Remove & Discard patch within 12 hours or as directed by MD       . loperamide (ANTI-DIARRHEAL) 2 MG tablet Take 2 mg by mouth 2 (two) times daily as needed.        . Multiple Vitamins-Minerals (CENTRUM SILVER ULTRA WOMENS PO) Take by mouth daily.        . Omega-3 Fatty Acids (FISH OIL) 1000 MG CAPS Take by mouth daily.        Letta Pate DELICA LANCETS MISC Use as directed to test blood sugar dx 250.00  100 each  2  . oxyCODONE-acetaminophen (PERCOCET) 10-325 MG per tablet Take 1 tablet by mouth every 6 (six) hours as needed for pain.  130 tablet  0  . pantoprazole (PROTONIX) 40 MG tablet Take 40 mg by mouth daily.        . promethazine (PHENERGAN) 25 MG tablet Take 1 tablet (25 mg total) by mouth every 6 (six) hours as needed for nausea.  60 tablet  1  . Psyllium (METAMUCIL) 30.9 % POWD Take by mouth. Take as directed       . topiramate (TOPAMAX) 100 MG tablet Take 3 tablets (300 mg total) by mouth at bedtime.  90 tablet  2   Review of Systems Review of Systems  Constitutional: Negative for diaphoresis and unexpected weight change.  HENT: Negative for drooling and tinnitus.   Eyes: Negative for photophobia and visual disturbance.    Respiratory: Negative for choking and stridor.   Gastrointestinal: Negative for vomiting and blood in stool.  Genitourinary: Negative for hematuria and decreased urine volume.        Objective:   Physical  Exam BP 120/62  Pulse 86  Temp(Src) 98.1 F (36.7 C) (Oral)  SpO2 96% Physical Exam  VS noted Constitutional: Pt appears well-developed and well-nourished.  HENT: Head: Normocephalic.  Right Ear: External ear normal.  Left Ear: External ear normal.  Eyes: Conjunctivae and EOM are normal. Pupils are equal, round, and reactive to light.  Neck: Normal range of motion. Neck supple.  Cardiovascular: Normal rate and regular rhythm.   Pulmonary/Chest: Effort normal and breath sounds normal.  Abd:  Soft, NT, non-distended, + BS Neurological: Pt is alert. No cranial nerve deficit.  Skin: LLE with 1+ red, tender c/w cellulitis to "band like" fashion above the ankle approx 1.5 cm, without drainage or abscess but small superficial ulcer approx .5 cm just above area of cellulitis, and swelling 1+ noted 2/3 of calf (acute on chronic)  Psychiatric: Pt behavior is normal. Thought content normal. Not confused    Assessment & Plan:

## 2011-08-14 NOTE — Assessment & Plan Note (Signed)
stable overall by hx and exam, most recent data reviewed with pt, and pt to continue medical treatment as before  BP Readings from Last 3 Encounters:  08/13/11 120/62  08/12/11 125/53  07/13/11 104/44

## 2011-08-14 NOTE — Assessment & Plan Note (Signed)
Mild to mod, for antibx course,  to f/u any worsening symptoms or concerns 

## 2011-08-14 NOTE — Assessment & Plan Note (Signed)
stable overall by hx and exam, most recent data reviewed with pt, and pt to continue medical treatment as before  Lab Results  Component Value Date   HGBA1C 6.3 03/30/2011    

## 2011-08-19 ENCOUNTER — Encounter: Payer: Self-pay | Admitting: *Deleted

## 2011-08-19 NOTE — Progress Notes (Signed)
RN visit of 08/12/11 findings and requests regarding condition of left leg forwarded in message to [name]. Addressed on his return to office 08/19/11. Documented office visit with Dr [name] on 3/28 with referral to [facility].

## 2011-08-20 ENCOUNTER — Telehealth: Payer: Self-pay | Admitting: *Deleted

## 2011-08-20 ENCOUNTER — Encounter: Payer: Self-pay | Admitting: Physical Medicine & Rehabilitation

## 2011-08-20 NOTE — Telephone Encounter (Signed)
Crystal Fitzpatrick called to say that she has been running a fever again and has had trouble with fentanyl patches sticking. She is needing to fill her rx that she was given on 08/12/11,  but it is not due for refill until next Wednesday and pharmacy ([facility]) will not fill. She called the manufacturer after being told by pharmacist to put duct tape over the patches and she would have to place old patches with tape until the new rx can be filled..  The manufacturer said not to put anything over them and not to reuse old patches because studies have not been done to insure the safety of that action, but that they had something new to the market that they were going to send her out that she will put on to keep the patches intact, and prevent loss.  (this is from [employer]).  Tomorrow 08/21/11 she will be needing a patch. Pharmacy (for this patch is [facility] [phone])  They will need to be notified if they can release patches early.

## 2011-08-21 ENCOUNTER — Telehealth: Payer: Self-pay | Admitting: *Deleted

## 2011-08-21 NOTE — Telephone Encounter (Signed)
Please notify the pharmacy that it's ok. thanks

## 2011-08-21 NOTE — Telephone Encounter (Signed)
[name] authorized early fill of fentanyl Rx and pharmacy has been notified

## 2011-08-24 NOTE — Telephone Encounter (Signed)
Fonda was able to get her patches from the pharmacy.

## 2011-08-25 ENCOUNTER — Other Ambulatory Visit: Payer: Self-pay | Admitting: Internal Medicine

## 2011-08-25 DIAGNOSIS — E119 Type 2 diabetes mellitus without complications: Secondary | ICD-10-CM | POA: Diagnosis not present

## 2011-08-25 DIAGNOSIS — L608 Other nail disorders: Secondary | ICD-10-CM | POA: Diagnosis not present

## 2011-08-26 ENCOUNTER — Other Ambulatory Visit: Payer: Self-pay | Admitting: *Deleted

## 2011-08-26 MED ORDER — DULOXETINE HCL 60 MG PO CPEP: 120.0000 mg | ORAL_CAPSULE | Freq: Every day | ORAL | Status: DC

## 2011-09-02 DIAGNOSIS — R569 Unspecified convulsions: Secondary | ICD-10-CM | POA: Diagnosis not present

## 2011-09-02 DIAGNOSIS — L0291 Cutaneous abscess, unspecified: Secondary | ICD-10-CM | POA: Diagnosis not present

## 2011-09-02 DIAGNOSIS — I509 Heart failure, unspecified: Secondary | ICD-10-CM | POA: Diagnosis not present

## 2011-09-02 DIAGNOSIS — L03119 Cellulitis of unspecified part of limb: Secondary | ICD-10-CM | POA: Diagnosis not present

## 2011-09-02 DIAGNOSIS — I890 Lymphedema, not elsewhere classified: Secondary | ICD-10-CM | POA: Diagnosis not present

## 2011-09-02 DIAGNOSIS — J45909 Unspecified asthma, uncomplicated: Secondary | ICD-10-CM | POA: Diagnosis not present

## 2011-09-02 DIAGNOSIS — B159 Hepatitis A without hepatic coma: Secondary | ICD-10-CM | POA: Diagnosis not present

## 2011-09-02 DIAGNOSIS — E119 Type 2 diabetes mellitus without complications: Secondary | ICD-10-CM | POA: Diagnosis not present

## 2011-09-07 ENCOUNTER — Other Ambulatory Visit: Payer: Self-pay

## 2011-09-07 MED ORDER — GABAPENTIN 300 MG PO CAPS: 300.0000 mg | ORAL_CAPSULE | Freq: Three times a day (TID) | ORAL | Status: DC

## 2011-09-08 ENCOUNTER — Telehealth: Payer: Self-pay

## 2011-09-08 MED ORDER — OXYCODONE-ACETAMINOPHEN 10-325 MG PO TABS: 1.0000 | ORAL_TABLET | Freq: Four times a day (QID) | ORAL | Status: DC | PRN

## 2011-09-08 MED ORDER — FENTANYL 75 MCG/HR TD PT72: 1.0000 | MEDICATED_PATCH | TRANSDERMAL | Status: DC

## 2011-09-08 NOTE — Telephone Encounter (Signed)
Pt called requesting refills on her percocet and patch.  Pt has had infection in her leg and is now being followed by the wound center in [facility] but is still needing her meds. Pt# [id number]

## 2011-09-09 ENCOUNTER — Telehealth: Payer: Self-pay

## 2011-09-09 MED ORDER — OXYCODONE-ACETAMINOPHEN 10-325 MG PO TABS: 1.0000 | ORAL_TABLET | Freq: Four times a day (QID) | ORAL | Status: DC | PRN

## 2011-09-09 NOTE — Telephone Encounter (Signed)
Called informed the caregiver Bridgette prescription requested has been filled and to pickup at the front desk.

## 2011-09-09 NOTE — Telephone Encounter (Signed)
Chart shows Dr Wynn Banker rx #130 pills apr 23 -  Please let pt know this, should not need more

## 2011-09-09 NOTE — Telephone Encounter (Signed)
The patient called to inform that her pain MD Dr. Riley Kill is out of town until Friday.She stated she contacted his office yesterday, but missed the call back from the nurse. She is completely out of the Oxycodone her prescribes and his office is closed today.  The patient is requesting #10 of the oxycodone to cover her until he returns on Friday.  Please advise as she did not know what else to do.

## 2011-09-09 NOTE — Telephone Encounter (Signed)
Done hardcopy to robin  

## 2011-09-09 NOTE — Telephone Encounter (Signed)
rx destroyed.

## 2011-09-09 NOTE — Telephone Encounter (Signed)
The patients caregiver Bridgette called back to inform the pharmacy had given the patient an incorrect amount on her oxycodone refill and they have now corrected the prescription.  Bridgette called to inform she does have her medication now and does not need the prescription written today 09/09/11 for # 10 Oxycodone 10-325. Prescription on MD's desk to discard.

## 2011-09-09 NOTE — Telephone Encounter (Signed)
The patient is requesting enough medication for today until the Dr. Riley Kill office opens back up tomorrow.

## 2011-09-09 NOTE — Telephone Encounter (Signed)
Called the patient informed of MD's response.  She got emotional on the phone stating she is scared as is out completely and Dr. Riley Kill office will not open until 09/10/11.

## 2011-09-11 ENCOUNTER — Telehealth: Payer: Self-pay | Admitting: Physical Medicine & Rehabilitation

## 2011-09-11 DIAGNOSIS — L0291 Cutaneous abscess, unspecified: Secondary | ICD-10-CM | POA: Diagnosis not present

## 2011-09-11 DIAGNOSIS — E119 Type 2 diabetes mellitus without complications: Secondary | ICD-10-CM | POA: Diagnosis not present

## 2011-09-11 DIAGNOSIS — I509 Heart failure, unspecified: Secondary | ICD-10-CM | POA: Diagnosis not present

## 2011-09-11 DIAGNOSIS — L0390 Cellulitis, unspecified: Secondary | ICD-10-CM | POA: Diagnosis not present

## 2011-09-11 DIAGNOSIS — B159 Hepatitis A without hepatic coma: Secondary | ICD-10-CM | POA: Diagnosis not present

## 2011-09-11 DIAGNOSIS — L02419 Cutaneous abscess of limb, unspecified: Secondary | ICD-10-CM | POA: Diagnosis not present

## 2011-09-11 DIAGNOSIS — J45909 Unspecified asthma, uncomplicated: Secondary | ICD-10-CM | POA: Diagnosis not present

## 2011-09-11 DIAGNOSIS — I890 Lymphedema, not elsewhere classified: Secondary | ICD-10-CM | POA: Diagnosis not present

## 2011-09-11 NOTE — Telephone Encounter (Signed)
Rx is ready for pick up, pt aware. 

## 2011-09-11 NOTE — Telephone Encounter (Signed)
Kimble will need refill on patches for weekend.

## 2011-09-11 NOTE — Telephone Encounter (Signed)
PLEASE cx request for additional Rx on pain meds, pharmacy error.

## 2011-09-23 ENCOUNTER — Other Ambulatory Visit: Payer: Self-pay | Admitting: *Deleted

## 2011-09-23 MED ORDER — TOPIRAMATE 100 MG PO TABS: 300.0000 mg | ORAL_TABLET | Freq: Every day | ORAL | Status: DC

## 2011-10-02 ENCOUNTER — Encounter: Payer: Self-pay | Admitting: Physical Medicine & Rehabilitation

## 2011-10-02 ENCOUNTER — Encounter: Payer: Medicare Other | Attending: Physical Medicine & Rehabilitation | Admitting: Physical Medicine & Rehabilitation

## 2011-10-02 ENCOUNTER — Telehealth: Payer: Self-pay

## 2011-10-02 VITALS — BP 145/66 | HR 88 | Ht 70.0 in | Wt 290.0 lb

## 2011-10-02 DIAGNOSIS — L03116 Cellulitis of left lower limb: Secondary | ICD-10-CM

## 2011-10-02 DIAGNOSIS — G8929 Other chronic pain: Secondary | ICD-10-CM | POA: Insufficient documentation

## 2011-10-02 DIAGNOSIS — S88119A Complete traumatic amputation at level between knee and ankle, unspecified lower leg, initial encounter: Secondary | ICD-10-CM | POA: Diagnosis not present

## 2011-10-02 DIAGNOSIS — E119 Type 2 diabetes mellitus without complications: Secondary | ICD-10-CM

## 2011-10-02 DIAGNOSIS — M069 Rheumatoid arthritis, unspecified: Secondary | ICD-10-CM | POA: Diagnosis not present

## 2011-10-02 DIAGNOSIS — S78119A Complete traumatic amputation at level between unspecified hip and knee, initial encounter: Secondary | ICD-10-CM | POA: Insufficient documentation

## 2011-10-02 DIAGNOSIS — M1710 Unilateral primary osteoarthritis, unspecified knee: Secondary | ICD-10-CM | POA: Insufficient documentation

## 2011-10-02 DIAGNOSIS — S81009A Unspecified open wound, unspecified knee, initial encounter: Secondary | ICD-10-CM

## 2011-10-02 DIAGNOSIS — S91009A Unspecified open wound, unspecified ankle, initial encounter: Secondary | ICD-10-CM

## 2011-10-02 DIAGNOSIS — L02419 Cutaneous abscess of limb, unspecified: Secondary | ICD-10-CM | POA: Diagnosis not present

## 2011-10-02 DIAGNOSIS — G609 Hereditary and idiopathic neuropathy, unspecified: Secondary | ICD-10-CM

## 2011-10-02 DIAGNOSIS — S81802A Unspecified open wound, left lower leg, initial encounter: Secondary | ICD-10-CM

## 2011-10-02 MED ORDER — FENTANYL 75 MCG/HR TD PT72: 1.0000 | MEDICATED_PATCH | TRANSDERMAL | Status: DC

## 2011-10-02 MED ORDER — OXYCODONE-ACETAMINOPHEN 10-325 MG PO TABS: 1.0000 | ORAL_TABLET | Freq: Four times a day (QID) | ORAL | Status: DC | PRN

## 2011-10-02 NOTE — Telephone Encounter (Signed)
Pt informed and was a little upset.

## 2011-10-02 NOTE — Telephone Encounter (Signed)
Pt is concerned that she only got #120 on her next script and she feels like it will not last a month.  She usually gets #130.  Please advise.

## 2011-10-02 NOTE — Progress Notes (Signed)
  Subjective:    Patient ID: Crystal Fitzpatrick, female    DOB: 11/02/1945, 66 y.o.   MRN: [id number]  HPI  Crystal Fitzpatrick is back regarding her chronic pain issues.  She is concerned because she lost 20 of her percocet. She is concerned that someone working in the home might have taken them. Apparently, this person has been behaving suspiciously.  She is quite upset about the entire situation.  She finally got a referral to the wound clinic. She is wearing a compression garment which is helping her left leg edema.  Her shin wound is healing as is the wound along her buttock/posterior leg.   Her right BK shrinker is getting old and isn't compressing as well. She can't a call back from the prosthetist for replacements.     Pain Inventory Average Pain 5 Pain Right Now 6 My pain is sharp, burning, stabbing and aching  In the last 24 hours, has pain interfered with the following? General activity 3 Relation with others 2 Enjoyment of life 8 What TIME of day is your pain at its worst? varies Sleep (in general) Fair  Pain is worse with: inactivity Pain improves with: therapy/exercise and medication Relief from Meds: 5  Mobility walk with assistance use a wheelchair needs help with transfers  Function disabled: date disabled   Neuro/Psych spasms  Prior Studies Any changes since last visit?  no  Physicians involved in your care Any changes since last visit?  no       Review of Systems  Constitutional: Positive for unexpected weight change.  Genitourinary: Positive for difficulty urinating.  Musculoskeletal: Positive for joint swelling.  All other systems reviewed and are negative.       Objective:   Physical Exam  Constitutional: She appears well-developed and well-nourished.  HENT:  Head: Normocephalic and atraumatic.  Nose: Nose normal.  Mouth/Throat: Oropharynx is clear and moist.  Eyes: Conjunctivae are normal.  Neck: Normal range of motion. Neck supple.    Cardiovascular: Normal rate and regular rhythm.   Pulmonary/Chest: No respiratory distress. She has no wheezes.  Musculoskeletal:       Edema 2+ RLE, 2++LLE. Chronic stasis changes are noted on the left leg.   I didn't examine her wounds today.  Weight is stable but she remains morbidly obese   Neurological: A sensory deficit (left foot) is present.  Psychiatric: She has a normal mood and affect. Her behavior is normal. Judgment and thought content normal.          Assessment & Plan:  ASSESSMENT:  1. Right below-knee amputation, history of phantom limb pain.  2. Peripheral neuropathy.  3. History of rheumatoid arthritis.  4. Osteoarthritis of left knee.  5. Chronic cellulitis, left leg with associated edema.  6. Left gluteal breakdown.   PLAN:  1. Continue compression stocking to right leg. I encouraged double  wrapping the distal portions to encourage increased edema control.  2. Continue wound care per the [facility] wound care clinic.  The compression wrap is helping her. 3. Replace stump shrinkers for right leg.  4. Refilled her pain medications today including Percocet 10/325, #130  and fentanyl 75 mcg #10. Discussed the importance of being careful with her meds.    6.She will follow up with my PA in one month.

## 2011-10-02 NOTE — Telephone Encounter (Signed)
i would like her to try stick with the 120 please. thx

## 2011-10-14 ENCOUNTER — Other Ambulatory Visit: Payer: Self-pay | Admitting: Internal Medicine

## 2011-10-15 ENCOUNTER — Telehealth: Payer: Self-pay

## 2011-10-15 ENCOUNTER — Other Ambulatory Visit: Payer: Self-pay

## 2011-10-15 DIAGNOSIS — E119 Type 2 diabetes mellitus without complications: Secondary | ICD-10-CM | POA: Diagnosis not present

## 2011-10-15 DIAGNOSIS — I509 Heart failure, unspecified: Secondary | ICD-10-CM | POA: Diagnosis not present

## 2011-10-15 DIAGNOSIS — L03119 Cellulitis of unspecified part of limb: Secondary | ICD-10-CM | POA: Diagnosis not present

## 2011-10-15 DIAGNOSIS — I890 Lymphedema, not elsewhere classified: Secondary | ICD-10-CM | POA: Diagnosis not present

## 2011-10-15 DIAGNOSIS — J45909 Unspecified asthma, uncomplicated: Secondary | ICD-10-CM | POA: Diagnosis not present

## 2011-10-15 DIAGNOSIS — R569 Unspecified convulsions: Secondary | ICD-10-CM | POA: Diagnosis not present

## 2011-10-15 DIAGNOSIS — L02419 Cutaneous abscess of limb, unspecified: Secondary | ICD-10-CM | POA: Diagnosis not present

## 2011-10-15 DIAGNOSIS — B159 Hepatitis A without hepatic coma: Secondary | ICD-10-CM | POA: Diagnosis not present

## 2011-10-15 MED ORDER — GABAPENTIN 600 MG PO TABS: 600.0000 mg | ORAL_TABLET | Freq: Three times a day (TID) | ORAL | Status: DC

## 2011-10-15 NOTE — Telephone Encounter (Signed)
Trey Paula needs order for lymphedema management.  Fax order to [phone].

## 2011-10-21 NOTE — Telephone Encounter (Signed)
Order faxed.

## 2011-10-29 ENCOUNTER — Telehealth: Payer: Self-pay | Admitting: *Deleted

## 2011-10-29 DIAGNOSIS — L03116 Cellulitis of left lower limb: Secondary | ICD-10-CM

## 2011-10-29 DIAGNOSIS — E119 Type 2 diabetes mellitus without complications: Secondary | ICD-10-CM

## 2011-10-29 DIAGNOSIS — M069 Rheumatoid arthritis, unspecified: Secondary | ICD-10-CM

## 2011-10-29 DIAGNOSIS — S88119A Complete traumatic amputation at level between knee and ankle, unspecified lower leg, initial encounter: Secondary | ICD-10-CM

## 2011-10-29 DIAGNOSIS — S81802A Unspecified open wound, left lower leg, initial encounter: Secondary | ICD-10-CM

## 2011-10-29 DIAGNOSIS — G609 Hereditary and idiopathic neuropathy, unspecified: Secondary | ICD-10-CM

## 2011-10-29 NOTE — Telephone Encounter (Signed)
Crystal Fitzpatrick called for Crystal Fitzpatrick for a refill on her Percocet and Fentanyl.  Crystal Fitzpatrick will be out of percocet tomorrow 10/30/11.  I explained to her that Crystal Fitzpatrick last received her rx for Percocet and filled it on 10/03/11, and that it is early.  Crystal Fitzpatrick said Crystal Fitzpatrick only gave her 120 instead of 130, but I told her that Crystal Fitzpatrick discussed that with Crystal Fitzpatrick in a phone call after her visit and was told Crystal Fitzpatrick wanted her to cut back and stay with the 120.  Crystal Fitzpatrick started crying and said Crystal Fitzpatrick was not told that. I told her I could do nothing until this was discussed with Crystal Fitzpatrick tomorrow when he comes in.   Her appt is 11/04/11.

## 2011-10-30 ENCOUNTER — Telehealth: Payer: Self-pay | Admitting: Physical Medicine & Rehabilitation

## 2011-10-30 MED ORDER — FENTANYL 75 MCG/HR TD PT72: 1.0000 | MEDICATED_PATCH | TRANSDERMAL | Status: DC

## 2011-10-30 MED ORDER — OXYCODONE-ACETAMINOPHEN 10-325 MG PO TABS: 1.0000 | ORAL_TABLET | Freq: Four times a day (QID) | ORAL | Status: DC | PRN

## 2011-10-30 NOTE — Telephone Encounter (Signed)
Yesterday's message was sent to [name] and awaiting his decision. I have nothing further I can discuss with her until [name] makes his descision.

## 2011-10-30 NOTE — Telephone Encounter (Signed)
Crystal Fitzpatrick,  Please call Clarisse Gouge concerning Sharice.  [phone]

## 2011-10-30 NOTE — Telephone Encounter (Signed)
You are correct, we did discuss that.  i will refer early.  Please make it clear to her that i won't fill her early again.

## 2011-10-30 NOTE — Telephone Encounter (Signed)
Clarisse Gouge is aware that rx's are ready for pick up.

## 2011-11-04 ENCOUNTER — Encounter: Payer: Medicare Other | Attending: Physical Medicine & Rehabilitation | Admitting: Physical Medicine & Rehabilitation

## 2011-11-04 ENCOUNTER — Encounter: Payer: Self-pay | Admitting: Physical Medicine & Rehabilitation

## 2011-11-04 VITALS — BP 139/58 | HR 79 | Resp 14 | Ht 70.0 in | Wt 280.0 lb

## 2011-11-04 DIAGNOSIS — I890 Lymphedema, not elsewhere classified: Secondary | ICD-10-CM | POA: Insufficient documentation

## 2011-11-04 DIAGNOSIS — L02419 Cutaneous abscess of limb, unspecified: Secondary | ICD-10-CM | POA: Diagnosis not present

## 2011-11-04 DIAGNOSIS — S88119A Complete traumatic amputation at level between knee and ankle, unspecified lower leg, initial encounter: Secondary | ICD-10-CM | POA: Insufficient documentation

## 2011-11-04 DIAGNOSIS — S81009A Unspecified open wound, unspecified knee, initial encounter: Secondary | ICD-10-CM

## 2011-11-04 DIAGNOSIS — L03116 Cellulitis of left lower limb: Secondary | ICD-10-CM

## 2011-11-04 DIAGNOSIS — M1710 Unilateral primary osteoarthritis, unspecified knee: Secondary | ICD-10-CM | POA: Insufficient documentation

## 2011-11-04 DIAGNOSIS — L03119 Cellulitis of unspecified part of limb: Secondary | ICD-10-CM | POA: Insufficient documentation

## 2011-11-04 DIAGNOSIS — G894 Chronic pain syndrome: Secondary | ICD-10-CM | POA: Diagnosis not present

## 2011-11-04 DIAGNOSIS — G609 Hereditary and idiopathic neuropathy, unspecified: Secondary | ICD-10-CM | POA: Insufficient documentation

## 2011-11-04 DIAGNOSIS — L988 Other specified disorders of the skin and subcutaneous tissue: Secondary | ICD-10-CM | POA: Insufficient documentation

## 2011-11-04 DIAGNOSIS — S91009A Unspecified open wound, unspecified ankle, initial encounter: Secondary | ICD-10-CM | POA: Diagnosis not present

## 2011-11-04 DIAGNOSIS — S81802A Unspecified open wound, left lower leg, initial encounter: Secondary | ICD-10-CM

## 2011-11-04 DIAGNOSIS — M069 Rheumatoid arthritis, unspecified: Secondary | ICD-10-CM | POA: Diagnosis not present

## 2011-11-04 DIAGNOSIS — E119 Type 2 diabetes mellitus without complications: Secondary | ICD-10-CM

## 2011-11-04 MED ORDER — OXYCODONE-ACETAMINOPHEN 10-325 MG PO TABS: 1.0000 | ORAL_TABLET | ORAL | Status: DC | PRN

## 2011-11-04 MED ORDER — FENTANYL 75 MCG/HR TD PT72: 1.0000 | MEDICATED_PATCH | TRANSDERMAL | Status: DC

## 2011-11-04 NOTE — Progress Notes (Signed)
Subjective:    Patient ID: Crystal Fitzpatrick, female    DOB: 06/19/1945, 66 y.o.   MRN: [id number]  HPI  Crystal Fitzpatrick is back regarding her chronic pain syndrome. She is upset because she thinks that I was cutting back on her percocet. I believe i mistakenly gave her #120 instead of the #130 i had been giving her previously.   She was seen by [facility] for eval of her limb. [facility] thought she might benefit from lymphedema control before eventual socket fitting.   She has lost weight recently which she attributes to better diet and recent anxiety (over the percocet issues) Pain Inventory Average Pain 5 Pain Right Now 4 My pain is constant, sharp, dull and aching  In the last 24 hours, has pain interfered with the following? General activity 5 Relation with others 0 Enjoyment of life 0 What TIME of day is your pain at its worst? morning Sleep (in general) Poor  Pain is worse with: some activites Pain improves with: heat/ice and medication Relief from Meds: 6  Mobility use a wheelchair needs help with transfers  Function disabled: date disabled  I need assistance with the following:  dressing, bathing, toileting, meal prep, household duties and shopping  Neuro/Psych bowel control problems trouble walking depression  Prior Studies Any changes since last visit?  no  Physicians involved in your care Any changes since last visit?  no   Family History  Problem Relation Age of Onset  . Breast cancer Sister   . Diabetes Other   . Stroke Other     Grandparents   History   Social History  . Marital Status: Divorced    Spouse Name: N/A    Number of Children: 1  . Years of Education: N/A   Occupational History  . retired Marketing executive    Social History Main Topics  . Smoking status: Former Games developer  . Smokeless tobacco: None  . Alcohol Use: No  . Drug Use: No  . Sexually Active: None   Other Topics Concern  . None   Social History Narrative    . None   Past Surgical History  Procedure Date  . Amputation 02/02/2008    below right knee  . S/p egd 2007    with Botox for? Achalasia  . Tonsillectomy   . S/p breast biopsy 1992  . 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 Tubal ligation   . Nasal septum surgery   . Foot surgury 1980 and 1981   Past Medical History  Diagnosis Date  . DIABETES MELLITUS, TYPE II 07/17/2008  . B12 DEFICIENCY 07/17/2008  . Acute gouty arthropathy 12/11/2008  . HYPONATREMIA 03/20/2010  . HYPERKALEMIA 10/31/2009  . ANEMIA-NOS 07/17/2008  . ANXIETY 07/17/2008  . Chronic pain syndrome 02/14/2010  . Trigeminal neuralgia 02/14/2010  . HEARING LOSS, RIGHT EAR 06/04/2009  . HYPERTENSION 07/17/2008  . CHF 01/28/2009  . Pneumonia, organism unspecified 02/14/2010  . GERD 07/17/2008  . IBS 06/04/2009  . UNSPECIFIED HEPATITIS 01/28/2009  . RENAL INSUFFICIENCY 10/31/2009  . MENOPAUSAL DISORDER 12/03/2009  . Cellulitis and abscess of leg, except foot 11/15/2008  . SKIN ULCER, CHRONIC 02/08/2009  . Rheumatoid arthritis 07/17/2008  . SKIN LESION 06/04/2009  . ARTHRITIS 01/28/2009  . FOOT PAIN 07/17/2008  . HYPERSOMNIA 02/14/2010  . Altered mental status 02/07/2010  . PERIPHERAL EDEMA 08/30/2008  . Wheezing 12/03/2009  . ABDOMINAL DISTENSION 10/11/2008  . Dysuria 11/11/2009  . Hypoxemia 03/20/2010  .  ANAPHYLACTIC SHOCK 05/15/2009  . NEUROPATHY, HX OF 01/28/2009  . COLONIC POLYPS, HX OF 07/17/2008  . AMPUTATION, BELOW KNEE, RIGHT, HX OF 01/28/2009  . Hyperlipemia 09/24/2010  . Hyperlipidemia 09/24/2010  . Seizure 10/26/2010   BP 139/58  Pulse 79  Resp 14  Ht 5\' 10"  (1.778 m)  Wt 280 lb (127.007 kg)  BMI 40.18 kg/m2  SpO2 95%     Review of Systems  Gastrointestinal: Positive for constipation.  All other systems reviewed and are negative.       Objective:   Physical Exam  Constitutional: She appears well-developed and well-nourished.  HENT:  Head: Normocephalic and atraumatic.  Nose: Nose normal.  Mouth/Throat:  Oropharynx is clear and moist.  Eyes: Conjunctivae are normal.  Neck: Normal range of motion. Neck supple.  Cardiovascular: Normal rate and regular rhythm.  Pulmonary/Chest: No respiratory distress. She has no wheezes.  Musculoskeletal:  Edema 2+ RLE, 2++LLE. Chronic stasis changes are noted on the left leg.   I didn't examine her wounds today.  Weight is stable but she remains morbidly obese  Neurological: A sensory deficit (left foot) is present.  Psychiatric: She has a normal mood and affect. Her behavior is normal. Judgment and thought content normal.       Assessment & Plan:   ASSESSMENT:  1. Right below-knee amputation, history of phantom limb pain.  2. Peripheral neuropathy.  3. History of rheumatoid arthritis.  4. Osteoarthritis of left knee.  5. Chronic cellulitis, left leg with associated edema.  6. Left gluteal breakdown.   PLAN:  1. Continue compression stocking to right leg. I encouraged double  wrapping the distal portions to encourage increased edema control.   -lymphedema program consult was made 2. Continue wound care per the [facility] wound care clinic. The compression wrap is helping her.  3. Needs Replacement stump shrinkers for right leg.  4. Refilled her pain medications today including Percocet 10/325, #130  and fentanyl 75 mcg #10. Discussed the importance of being careful with her meds.   -consider pain psychology consult with Dr. Gennie Alma  -goal will be to taper meds  6.She will follow up with me in one month.

## 2011-11-05 ENCOUNTER — Other Ambulatory Visit: Payer: Self-pay | Admitting: *Deleted

## 2011-11-05 MED ORDER — CYCLOBENZAPRINE HCL 10 MG PO TABS: 10.0000 mg | ORAL_TABLET | Freq: Three times a day (TID) | ORAL | Status: DC | PRN

## 2011-11-07 ENCOUNTER — Other Ambulatory Visit: Payer: Self-pay | Admitting: Internal Medicine

## 2011-11-09 NOTE — Telephone Encounter (Signed)
Faxed hardcopy to pharmacy. 

## 2011-11-09 NOTE — Telephone Encounter (Signed)
Done hardcopy to robin  

## 2011-11-11 ENCOUNTER — Telehealth: Payer: Self-pay | Admitting: Physical Medicine & Rehabilitation

## 2011-11-12 DIAGNOSIS — E119 Type 2 diabetes mellitus without complications: Secondary | ICD-10-CM | POA: Diagnosis not present

## 2011-11-12 DIAGNOSIS — J45909 Unspecified asthma, uncomplicated: Secondary | ICD-10-CM | POA: Diagnosis not present

## 2011-11-12 DIAGNOSIS — L02419 Cutaneous abscess of limb, unspecified: Secondary | ICD-10-CM | POA: Diagnosis not present

## 2011-11-12 DIAGNOSIS — B159 Hepatitis A without hepatic coma: Secondary | ICD-10-CM | POA: Diagnosis not present

## 2011-11-12 DIAGNOSIS — I509 Heart failure, unspecified: Secondary | ICD-10-CM | POA: Diagnosis not present

## 2011-11-12 DIAGNOSIS — R569 Unspecified convulsions: Secondary | ICD-10-CM | POA: Diagnosis not present

## 2011-11-12 DIAGNOSIS — L03119 Cellulitis of unspecified part of limb: Secondary | ICD-10-CM | POA: Diagnosis not present

## 2011-11-12 DIAGNOSIS — I890 Lymphedema, not elsewhere classified: Secondary | ICD-10-CM | POA: Diagnosis not present

## 2011-11-27 DIAGNOSIS — R0602 Shortness of breath: Secondary | ICD-10-CM | POA: Diagnosis not present

## 2011-11-27 DIAGNOSIS — I129 Hypertensive chronic kidney disease with stage 1 through stage 4 chronic kidney disease, or unspecified chronic kidney disease: Secondary | ICD-10-CM | POA: Diagnosis present

## 2011-11-27 DIAGNOSIS — I890 Lymphedema, not elsewhere classified: Secondary | ICD-10-CM | POA: Diagnosis present

## 2011-11-27 DIAGNOSIS — J45909 Unspecified asthma, uncomplicated: Secondary | ICD-10-CM | POA: Diagnosis present

## 2011-11-27 DIAGNOSIS — R569 Unspecified convulsions: Secondary | ICD-10-CM | POA: Diagnosis present

## 2011-11-27 DIAGNOSIS — R509 Fever, unspecified: Secondary | ICD-10-CM | POA: Diagnosis not present

## 2011-11-27 DIAGNOSIS — L02419 Cutaneous abscess of limb, unspecified: Secondary | ICD-10-CM | POA: Diagnosis not present

## 2011-11-27 DIAGNOSIS — I509 Heart failure, unspecified: Secondary | ICD-10-CM | POA: Diagnosis present

## 2011-11-27 DIAGNOSIS — L03119 Cellulitis of unspecified part of limb: Secondary | ICD-10-CM | POA: Diagnosis present

## 2011-11-27 DIAGNOSIS — N189 Chronic kidney disease, unspecified: Secondary | ICD-10-CM | POA: Diagnosis present

## 2011-11-27 DIAGNOSIS — K219 Gastro-esophageal reflux disease without esophagitis: Secondary | ICD-10-CM | POA: Diagnosis present

## 2011-11-27 DIAGNOSIS — J159 Unspecified bacterial pneumonia: Secondary | ICD-10-CM | POA: Diagnosis not present

## 2011-11-27 DIAGNOSIS — R4182 Altered mental status, unspecified: Secondary | ICD-10-CM | POA: Diagnosis not present

## 2011-11-27 DIAGNOSIS — J9600 Acute respiratory failure, unspecified whether with hypoxia or hypercapnia: Secondary | ICD-10-CM | POA: Diagnosis not present

## 2011-11-27 DIAGNOSIS — E872 Acidosis: Secondary | ICD-10-CM | POA: Diagnosis not present

## 2011-11-27 DIAGNOSIS — B159 Hepatitis A without hepatic coma: Secondary | ICD-10-CM | POA: Diagnosis present

## 2011-11-27 DIAGNOSIS — G8929 Other chronic pain: Secondary | ICD-10-CM | POA: Diagnosis not present

## 2011-11-27 DIAGNOSIS — R918 Other nonspecific abnormal finding of lung field: Secondary | ICD-10-CM | POA: Diagnosis not present

## 2011-11-27 DIAGNOSIS — J9620 Acute and chronic respiratory failure, unspecified whether with hypoxia or hypercapnia: Secondary | ICD-10-CM | POA: Diagnosis not present

## 2011-11-27 DIAGNOSIS — J690 Pneumonitis due to inhalation of food and vomit: Secondary | ICD-10-CM | POA: Diagnosis not present

## 2011-11-27 DIAGNOSIS — I10 Essential (primary) hypertension: Secondary | ICD-10-CM | POA: Diagnosis not present

## 2011-11-27 DIAGNOSIS — Z79899 Other long term (current) drug therapy: Secondary | ICD-10-CM | POA: Diagnosis not present

## 2011-11-27 DIAGNOSIS — E119 Type 2 diabetes mellitus without complications: Secondary | ICD-10-CM | POA: Diagnosis not present

## 2011-12-01 ENCOUNTER — Encounter: Payer: Self-pay | Admitting: Internal Medicine

## 2011-12-01 DIAGNOSIS — J690 Pneumonitis due to inhalation of food and vomit: Secondary | ICD-10-CM

## 2011-12-01 HISTORY — DX: Pneumonitis due to inhalation of food and vomit: J69.0

## 2011-12-03 ENCOUNTER — Encounter: Payer: Medicare Other | Admitting: Physical Medicine and Rehabilitation

## 2011-12-07 DIAGNOSIS — I890 Lymphedema, not elsewhere classified: Secondary | ICD-10-CM | POA: Diagnosis not present

## 2011-12-07 DIAGNOSIS — R269 Unspecified abnormalities of gait and mobility: Secondary | ICD-10-CM | POA: Diagnosis not present

## 2011-12-07 DIAGNOSIS — IMO0001 Reserved for inherently not codable concepts without codable children: Secondary | ICD-10-CM | POA: Diagnosis not present

## 2011-12-07 DIAGNOSIS — M6281 Muscle weakness (generalized): Secondary | ICD-10-CM | POA: Diagnosis not present

## 2011-12-09 ENCOUNTER — Encounter: Payer: Self-pay | Admitting: Physical Medicine and Rehabilitation

## 2011-12-09 ENCOUNTER — Encounter
Payer: Medicare Other | Attending: Physical Medicine and Rehabilitation | Admitting: Physical Medicine and Rehabilitation

## 2011-12-09 VITALS — BP 127/59 | HR 86 | Resp 16 | Ht 70.5 in | Wt 280.0 lb

## 2011-12-09 DIAGNOSIS — L02419 Cutaneous abscess of limb, unspecified: Secondary | ICD-10-CM | POA: Diagnosis not present

## 2011-12-09 DIAGNOSIS — G547 Phantom limb syndrome without pain: Secondary | ICD-10-CM | POA: Diagnosis not present

## 2011-12-09 DIAGNOSIS — IMO0002 Reserved for concepts with insufficient information to code with codable children: Secondary | ICD-10-CM

## 2011-12-09 DIAGNOSIS — L03119 Cellulitis of unspecified part of limb: Secondary | ICD-10-CM | POA: Insufficient documentation

## 2011-12-09 DIAGNOSIS — G609 Hereditary and idiopathic neuropathy, unspecified: Secondary | ICD-10-CM

## 2011-12-09 DIAGNOSIS — R609 Edema, unspecified: Secondary | ICD-10-CM | POA: Insufficient documentation

## 2011-12-09 DIAGNOSIS — M069 Rheumatoid arthritis, unspecified: Secondary | ICD-10-CM | POA: Diagnosis not present

## 2011-12-09 DIAGNOSIS — S81802A Unspecified open wound, left lower leg, initial encounter: Secondary | ICD-10-CM

## 2011-12-09 DIAGNOSIS — S88119A Complete traumatic amputation at level between knee and ankle, unspecified lower leg, initial encounter: Secondary | ICD-10-CM

## 2011-12-09 DIAGNOSIS — E119 Type 2 diabetes mellitus without complications: Secondary | ICD-10-CM

## 2011-12-09 DIAGNOSIS — L989 Disorder of the skin and subcutaneous tissue, unspecified: Secondary | ICD-10-CM | POA: Diagnosis not present

## 2011-12-09 DIAGNOSIS — S91009A Unspecified open wound, unspecified ankle, initial encounter: Secondary | ICD-10-CM | POA: Diagnosis not present

## 2011-12-09 DIAGNOSIS — M1710 Unilateral primary osteoarthritis, unspecified knee: Secondary | ICD-10-CM | POA: Insufficient documentation

## 2011-12-09 DIAGNOSIS — L03116 Cellulitis of left lower limb: Secondary | ICD-10-CM

## 2011-12-09 MED ORDER — OXYCODONE-ACETAMINOPHEN 10-325 MG PO TABS: 1.0000 | ORAL_TABLET | ORAL | Status: DC | PRN

## 2011-12-09 MED ORDER — FENTANYL 75 MCG/HR TD PT72: 1.0000 | MEDICATED_PATCH | TRANSDERMAL | Status: DC

## 2011-12-09 NOTE — Progress Notes (Signed)
Subjective:    Patient ID: Crystal Fitzpatrick, female    DOB: November 07, 1945, 66 y.o.   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 After lymph edema in 2010 she was not able to wear a prosthetic leg anymore. Since then she is in a wheelchair. She reports that she will start lymph drainage tomorrow, and that she will receive this treatment 3 times per week. She states that then she will [facility]up with [facility], for a possible prosthetic leg. Her problem has been stable. Pain Inventory Average Pain 6 Pain Right Now 8 My pain is constant  In the last 24 hours, has pain interfered with the following? General activity 5 Relation with others 5 Enjoyment of life 8 What TIME of day is your pain at its worst? All Day Sleep (in general) Fair  Pain is worse with: walking, bending, inactivity and standing Pain improves with: medication and injections Relief from Meds: 4  Mobility ability to climb steps?  no do you drive?  no use a wheelchair  Function retired  Neuro/Psych No problems in this area  Prior Studies Any changes since last visit?  no  Physicians involved in your care Any changes since last visit?  no   Family History  Problem Relation Age of Onset  . Breast cancer Sister   . Diabetes Other   . Stroke Other     Grandparents   History   Social History  . Marital Status: Divorced    Spouse Name: N/A    Number of Children: 1  . Years of Education: N/A   Occupational History  . retired Marketing executive    Social History Main Topics  . Smoking status: Former Games developer  . Smokeless tobacco: None  . Alcohol Use: No  . Drug Use: No  . Sexually Active: None   Other Topics Concern  . None   Social History Narrative  . None   Past Surgical History  Procedure Date  . Amputation 02/02/2008    below right knee  . S/p egd 2007    with Botox for? Achalasia  . Tonsillectomy   . S/p breast  biopsy 1992  . 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 Tubal ligation   . Nasal septum surgery   . Foot surgury 1980 and 1981   Past Medical History  Diagnosis Date  . DIABETES MELLITUS, TYPE II 07/17/2008  . B12 DEFICIENCY 07/17/2008  . Acute gouty arthropathy 12/11/2008  . HYPONATREMIA 03/20/2010  . HYPERKALEMIA 10/31/2009  . ANEMIA-NOS 07/17/2008  . ANXIETY 07/17/2008  . Chronic pain syndrome 02/14/2010  . Trigeminal neuralgia 02/14/2010  . HEARING LOSS, RIGHT EAR 06/04/2009  . HYPERTENSION 07/17/2008  . CHF 01/28/2009  . Pneumonia, organism unspecified 02/14/2010  . GERD 07/17/2008  . IBS 06/04/2009  . UNSPECIFIED HEPATITIS 01/28/2009  . RENAL INSUFFICIENCY 10/31/2009  . MENOPAUSAL DISORDER 12/03/2009  . Cellulitis and abscess of leg, except foot 11/15/2008  . SKIN ULCER, CHRONIC 02/08/2009  . Rheumatoid arthritis 07/17/2008  . SKIN LESION 06/04/2009  . ARTHRITIS 01/28/2009  . FOOT PAIN 07/17/2008  . HYPERSOMNIA 02/14/2010  . Altered mental status 02/07/2010  . PERIPHERAL EDEMA 08/30/2008  . Wheezing 12/03/2009  . ABDOMINAL DISTENSION 10/11/2008  . Dysuria 11/11/2009  . Hypoxemia 03/20/2010  . ANAPHYLACTIC SHOCK 05/15/2009  . NEUROPATHY, HX OF 01/28/2009  . COLONIC POLYPS, HX OF 07/17/2008  . AMPUTATION, BELOW KNEE, RIGHT, HX OF 01/28/2009  .  Hyperlipemia 09/24/2010  . Hyperlipidemia 09/24/2010  . Seizure 10/26/2010  . Pneumonia, aspiration 12/01/2011    Episode July 2013, tx per [facility]   BP 127/59  Pulse 86  Resp 16  Ht 5' 10.5" (1.791 m)  Wt 280 lb (127.007 kg)  BMI 39.61 kg/m2  SpO2 93%      Review of Systems  Constitutional: Positive for fever, chills and unexpected weight change.  HENT: Negative.   Eyes: Negative.   Respiratory: Negative.   Cardiovascular: Negative.   Gastrointestinal: Negative.   Genitourinary: Negative.   Musculoskeletal: Positive for back pain.  Skin: Positive for rash.  Neurological: Negative.   Hematological: Negative.     Psychiatric/Behavioral: Negative.        Objective:   Physical Exam Constitutional: She appears well-developed and well-nourished.  HENT:  Head: Normocephalic and atraumatic.  Nose: Nose normal.  Mouth/Throat: Oropharynx is clear and moist.  Eyes: Conjunctivae are normal.  Neck: Normal range of motion. Neck supple.  Cardiovascular: Normal rate and regular rhythm.  Pulmonary/Chest: No respiratory distress. She has no wheezes.  Musculoskeletal: Overall grossly intact 5/5, except right BKA Edema 2+ RLE, 2++LLE. Chronic stasis changes are noted on the left leg.   I didn't examine her wounds today.  Weight is stable but she remains morbidly obese  Neurological: A sensory deficit (left foot) is present.  Psychiatric: She has a normal mood and affect. Her behavior is normal. Judgment and thought content normal.         Assessment & Plan:  1. Right below-knee amputation, history of phantom limb pain.  2. Peripheral neuropathy.  3. History of rheumatoid arthritis.  4. Osteoarthritis of left knee.  5. Chronic cellulitis, left leg with associated edema.  6. Left gluteal breakdown.  PLAN:  1. Continue compression stocking to right leg. I encouraged double  wrapping the distal portions to encourage increased edema control.  -lymphedema program will start tomorrow. Afterwards she will follow up with [facility]. 2. Continue wound care per the [facility] wound care clinic. The compression wrap is helping her.  3.  Refilled her pain medications today including Percocet 10/325, #130  and fentanyl 75 mcg #10. Discussed the importance of being careful with her meds.  -consider pain psychology consult with Dr. Gennie Alma  -goal will be to taper meds  6.She will follow up in one month.

## 2011-12-09 NOTE — Patient Instructions (Addendum)
Continue to follow up with [facility], continue with Lymphdrainage, follow up with PCP/wound care for your open wounds

## 2011-12-10 ENCOUNTER — Ambulatory Visit (INDEPENDENT_AMBULATORY_CARE_PROVIDER_SITE_OTHER): Payer: Medicare Other | Admitting: Internal Medicine

## 2011-12-10 ENCOUNTER — Encounter: Payer: Self-pay | Admitting: Internal Medicine

## 2011-12-10 ENCOUNTER — Ambulatory Visit (INDEPENDENT_AMBULATORY_CARE_PROVIDER_SITE_OTHER)
Admission: RE | Admit: 2011-12-10 | Discharge: 2011-12-10 | Disposition: A | Discharge status: Home / Self Care | Payer: Medicare Other | Source: Ambulatory Visit | Attending: Internal Medicine | Admitting: Internal Medicine

## 2011-12-10 VITALS — BP 102/70 | HR 88 | Temp 99.0°F | Wt 280.0 lb

## 2011-12-10 DIAGNOSIS — I10 Essential (primary) hypertension: Secondary | ICD-10-CM | POA: Diagnosis not present

## 2011-12-10 DIAGNOSIS — J690 Pneumonitis due to inhalation of food and vomit: Secondary | ICD-10-CM

## 2011-12-10 DIAGNOSIS — L03116 Cellulitis of left lower limb: Secondary | ICD-10-CM

## 2011-12-10 DIAGNOSIS — R269 Unspecified abnormalities of gait and mobility: Secondary | ICD-10-CM | POA: Diagnosis not present

## 2011-12-10 DIAGNOSIS — M6281 Muscle weakness (generalized): Secondary | ICD-10-CM | POA: Diagnosis not present

## 2011-12-10 DIAGNOSIS — I890 Lymphedema, not elsewhere classified: Secondary | ICD-10-CM | POA: Diagnosis not present

## 2011-12-10 DIAGNOSIS — E119 Type 2 diabetes mellitus without complications: Secondary | ICD-10-CM

## 2011-12-10 DIAGNOSIS — L03119 Cellulitis of unspecified part of limb: Secondary | ICD-10-CM | POA: Diagnosis not present

## 2011-12-10 DIAGNOSIS — L02419 Cutaneous abscess of limb, unspecified: Secondary | ICD-10-CM | POA: Diagnosis not present

## 2011-12-10 DIAGNOSIS — J189 Pneumonia, unspecified organism: Secondary | ICD-10-CM | POA: Diagnosis not present

## 2011-12-10 DIAGNOSIS — IMO0001 Reserved for inherently not codable concepts without codable children: Secondary | ICD-10-CM | POA: Diagnosis not present

## 2011-12-10 MED ORDER — AMOXICILLIN 500 MG PO CAPS: ORAL_CAPSULE | ORAL | Status: DC

## 2011-12-10 NOTE — Assessment & Plan Note (Signed)
Clinically improved, with persistent mild non prod cough and low grade temp, finished antibx, for f/u cxr today, o/w hold further tx

## 2011-12-10 NOTE — Progress Notes (Signed)
Subjective:    Patient ID: Crystal Fitzpatrick, female    DOB: 1945/08/23, 66 y.o.   MRN: [id number]  HPI  Here to f/u; overall doing ok, but was hospd July 12-15 with fever/pneumonia - ? Aspirate at [facility], with quick onset confusion, prod cough, sob, weakness and prostration, responded nicely to innial tx without acute resp failure (had been on Ventilator the yr prior with similar illness), and has finished her oral levaquin/outpt antibx.  Still has low grade temp and fatigue as well as infreq non prod cough, as well as ? Of wheeze to right chest per home health nurse 2 days ago, but overall sob/doe has steadily improved since at home, and Pt denies chest pain, increased sob or doe, wheezing, orthopnea, PND, increased LE swelling, palpitations, dizziness or syncope.  Unfortunately has developed again a recurrence of LLE red/tender/weepiness mild but definite in the last wk, has done well with amoxil per pt in the past.  Cont's to be followed by wound clinic, does mention new mild maceration behind the right knee today that is new, as well as more chronic but persitently open small wound near the left ischial area that refuses to completely heal, but no recent increased pain/red/swelling.  Denies worsening reflux, dysphagia, abd pain, n/v, bowel change or blood.   Past Medical History  Diagnosis Date  . DIABETES MELLITUS, TYPE II 07/17/2008  . B12 DEFICIENCY 07/17/2008  . Acute gouty arthropathy 12/11/2008  . HYPONATREMIA 03/20/2010  . HYPERKALEMIA 10/31/2009  . ANEMIA-NOS 07/17/2008  . ANXIETY 07/17/2008  . Chronic pain syndrome 02/14/2010  . Trigeminal neuralgia 02/14/2010  . HEARING LOSS, RIGHT EAR 06/04/2009  . HYPERTENSION 07/17/2008  . CHF 01/28/2009  . Pneumonia, organism unspecified 02/14/2010  . GERD 07/17/2008  . IBS 06/04/2009  . UNSPECIFIED HEPATITIS 01/28/2009  . RENAL INSUFFICIENCY 10/31/2009  . MENOPAUSAL DISORDER 12/03/2009  . Cellulitis and abscess of leg, except foot 11/15/2008  . SKIN ULCER,  CHRONIC 02/08/2009  . Rheumatoid arthritis 07/17/2008  . SKIN LESION 06/04/2009  . ARTHRITIS 01/28/2009  . FOOT PAIN 07/17/2008  . HYPERSOMNIA 02/14/2010  . Altered mental status 02/07/2010  . PERIPHERAL EDEMA 08/30/2008  . Wheezing 12/03/2009  . ABDOMINAL DISTENSION 10/11/2008  . Dysuria 11/11/2009  . Hypoxemia 03/20/2010  . ANAPHYLACTIC SHOCK 05/15/2009  . NEUROPATHY, HX OF 01/28/2009  . COLONIC POLYPS, HX OF 07/17/2008  . AMPUTATION, BELOW KNEE, RIGHT, HX OF 01/28/2009  . Hyperlipemia 09/24/2010  . Hyperlipidemia 09/24/2010  . Seizure 10/26/2010  . Pneumonia, aspiration 12/01/2011    Episode July 2013, tx per [facility]   Past Surgical History  Procedure Date  . Amputation 02/02/2008    below right knee  . S/p egd 2007    with Botox for? Achalasia  . Tonsillectomy   . S/p breast biopsy 1992  . 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 Tubal ligation   . Nasal septum surgery   . Foot surgury 1980 and 1981    reports that she has quit smoking. She does not have any smokeless tobacco history on file. She reports that she does not drink alcohol or use illicit drugs. family history includes Breast cancer in her sister; Diabetes in her other; and Stroke in her other. Allergies  Allergen Reactions  . Morphine Anaphylaxis  . Sulfonamide Derivatives Diarrhea  . Ciprofloxacin Diarrhea  . Doxycycline     Nausea, diarrhea  . Nitrofurantoin     REACTION: itch  . Erythromycin  Rash   Current Outpatient Prescriptions on File Prior to Visit  Medication Sig Dispense Refill  . albuterol (PROAIR HFA) 108 (90 BASE) MCG/ACT inhaler Inhale 2 puffs into the lungs every 6 (six) hours as needed.        . ALPRAZolam (XANAX) 0.25 MG tablet TAKE 1 TABLET BY MOUTH 3 TIMES DAILY    AS NEEDED  90 tablet  2  . Ascorbic Acid (VITAMIN C) 500 MG tablet Take 500 mg by mouth daily.        [name] BENICAR 40 MG tablet TAKE 1 TABLET BY MOUTH EVERY DAY FOR    BLOOD PRESSURE  30 tablet  11  . Blood Glucose  Monitoring Suppl (ONE TOUCH ULTRA 2) W/DEVICE KIT 1 Device by Does not apply route once.  1 each  0  . Blood Glucose Monitoring Suppl (ONE TOUCH ULTRA SYSTEM KIT) W/DEVICE KIT 1 kit by Does not apply route once.  1 each  0  . Cholecalciferol (VITAMIN D) 400 UNITS capsule Take 400 Units by mouth daily.        . clotrimazole-betamethasone (LOTRISONE) cream Use as directed twice per day as needed  15 g  1  . cyclobenzaprine (FLEXERIL) 10 MG tablet Take 1 tablet (10 mg total) by mouth every 8 (eight) hours as needed.  90 tablet  1  . diphenoxylate-atropine (LOMOTIL) 2.5-0.025 MG per tablet 1 tab by mouth twice per day as needed  60 tablet  2  . DULoxetine (CYMBALTA) 60 MG capsule Take 2 capsules (120 mg total) by mouth daily.  60 capsule  5  . EPINEPHrine (EPIPEN 2-PAK) 0.3 mg/0.3 mL DEVI Inject 0.3 mLs (0.3 mg total) into the muscle once. Use as directed  1 Device  0  . fentaNYL (DURAGESIC - DOSED MCG/HR) 75 MCG/HR Place 1 patch (75 mcg total) onto the skin every 3 (three) days. 1 patch every 3 days  10 patch  0  . furosemide (LASIX) 40 MG tablet Take 1 tablet (40 mg total) by mouth 2 (two) times daily.  60 tablet  11  . gabapentin (NEURONTIN) 600 MG tablet Take 1 tablet (600 mg total) by mouth 3 (three) times daily.  90 tablet  3  . glucose blood (ONE TOUCH ULTRA TEST) test strip PRN  100 each  5  . lidocaine (LIDODERM) 5 % Place 1 patch onto the skin. Remove & Discard patch within 12 hours or as directed by MD       . loperamide (ANTI-DIARRHEAL) 2 MG tablet Take 2 mg by mouth 2 (two) times daily as needed.        . Multiple Vitamins-Minerals (CENTRUM SILVER ULTRA WOMENS PO) Take by mouth daily.        . Omega-3 Fatty Acids (FISH OIL) 1000 MG CAPS Take by mouth daily.        Letta Pate DELICA LANCETS MISC Use as directed to test blood sugar dx 250.00  100 each  2  . oxyCODONE-acetaminophen (PERCOCET) 10-325 MG per tablet Take 1 tablet by mouth every 4 (four) hours as needed for pain.  130 tablet  0    . pantoprazole (PROTONIX) 40 MG tablet TAKE ONE (1) TABLET BY MOUTH EVERY DAY  FOR ACID REFLUX  30 tablet  11  . promethazine (PHENERGAN) 25 MG tablet Take 1 tablet (25 mg total) by mouth every 6 (six) hours as needed for nausea.  60 tablet  1  . Psyllium (METAMUCIL) 30.9 % POWD Take by mouth. Take as  directed       . topiramate (TOPAMAX) 100 MG tablet Take 3 tablets (300 mg total) by mouth at bedtime.  90 tablet  2  . atorvastatin (LIPITOR) 10 MG tablet Take 1 tablet (10 mg total) by mouth daily.  90 tablet  3   Review of Systems Constitutional: Negative for diaphoresis and unexpected weight change.  HENT: Negative for tinnitus.   Eyes: Negative for photophobia and visual disturbance.  Respiratory: Negative for choking and stridor.   Gastrointestinal: Negative for vomiting and blood in stool.  Genitourinary: Negative for hematuria and decreased urine volume.  Musculoskeletal: Negative for acute joint swelling, remains wheelchair bound, has shrinker to RLE stump, not yet fitted for prosthetic  Skin: Negative for color change and wound.  Neurological: Negative for tremors and numbness.    Objective:   Physical Exam BP 102/70  Pulse 88  Temp 99 F (37.2 C) (Oral)  Wt 280 lb (127.007 kg)  SpO2 94% Physical Exam  VS noted, not ill appearing Constitutional: Pt appears well-developed and well-nourished.  HENT: Head: Normocephalic.  Right Ear: External ear normal.  Left Ear: External ear normal.  Bilat tm's mild erythema.  Sinus nontender.  Pharynx mild erythema Eyes: Conjunctivae and EOM are normal. Pupils are equal, round, and reactive to light.  Neck: Normal range of motion. Neck supple.  Cardiovascular: Normal rate and regular rhythm.   Pulmonary/Chest: Effort normal and breath sounds mild decreased on right but no rales or wheezing.  Abd:  Soft, NT, non-distended, + BS Neurological: Pt is alert.  Skin: Skin is warm. LLE with approx 6 cm area distal leg about the ankle posteriorly  with mild red/tender/weepiness without fluctuance or pus  Psychiatric: Pt behavior is normal. Thought content normal. 1+ nervous    Assessment & Plan:

## 2011-12-10 NOTE — Assessment & Plan Note (Signed)
stable overall by hx and exam, most recent data reviewed with pt, and pt to continue medical treatment as before  Lab Results  Component Value Date   HGBA1C 6.3 03/30/2011    

## 2011-12-10 NOTE — Assessment & Plan Note (Signed)
stable overall by hx and exam, most recent data reviewed with pt, and pt to continue medical treatment as before BP Readings from Last 3 Encounters:  12/10/11 102/70  12/09/11 127/59  11/04/11 139/58

## 2011-12-10 NOTE — Patient Instructions (Addendum)
Take all new medications as prescribed Continue all other medications as before Please go to XRAY in the Basement for the x-ray test You will be contacted by phone if any changes need to be made immediately.  Otherwise, you will receive a letter about your results with an explanation.  

## 2011-12-10 NOTE — Assessment & Plan Note (Signed)
Mild to mod, for antibx course,  to f/u any worsening symptoms or concerns 

## 2011-12-11 DIAGNOSIS — I890 Lymphedema, not elsewhere classified: Secondary | ICD-10-CM | POA: Diagnosis not present

## 2011-12-11 DIAGNOSIS — IMO0001 Reserved for inherently not codable concepts without codable children: Secondary | ICD-10-CM | POA: Diagnosis not present

## 2011-12-11 DIAGNOSIS — R269 Unspecified abnormalities of gait and mobility: Secondary | ICD-10-CM | POA: Diagnosis not present

## 2011-12-11 DIAGNOSIS — M6281 Muscle weakness (generalized): Secondary | ICD-10-CM | POA: Diagnosis not present

## 2011-12-14 DIAGNOSIS — M6281 Muscle weakness (generalized): Secondary | ICD-10-CM | POA: Diagnosis not present

## 2011-12-14 DIAGNOSIS — IMO0001 Reserved for inherently not codable concepts without codable children: Secondary | ICD-10-CM | POA: Diagnosis not present

## 2011-12-14 DIAGNOSIS — R269 Unspecified abnormalities of gait and mobility: Secondary | ICD-10-CM | POA: Diagnosis not present

## 2011-12-14 DIAGNOSIS — I890 Lymphedema, not elsewhere classified: Secondary | ICD-10-CM | POA: Diagnosis not present

## 2011-12-15 DIAGNOSIS — R269 Unspecified abnormalities of gait and mobility: Secondary | ICD-10-CM | POA: Diagnosis not present

## 2011-12-15 DIAGNOSIS — IMO0001 Reserved for inherently not codable concepts without codable children: Secondary | ICD-10-CM | POA: Diagnosis not present

## 2011-12-15 DIAGNOSIS — I890 Lymphedema, not elsewhere classified: Secondary | ICD-10-CM | POA: Diagnosis not present

## 2011-12-15 DIAGNOSIS — M6281 Muscle weakness (generalized): Secondary | ICD-10-CM | POA: Diagnosis not present

## 2011-12-18 DIAGNOSIS — R269 Unspecified abnormalities of gait and mobility: Secondary | ICD-10-CM | POA: Diagnosis not present

## 2011-12-18 DIAGNOSIS — IMO0001 Reserved for inherently not codable concepts without codable children: Secondary | ICD-10-CM | POA: Diagnosis not present

## 2011-12-18 DIAGNOSIS — M6281 Muscle weakness (generalized): Secondary | ICD-10-CM | POA: Diagnosis not present

## 2011-12-18 DIAGNOSIS — I890 Lymphedema, not elsewhere classified: Secondary | ICD-10-CM | POA: Diagnosis not present

## 2011-12-21 ENCOUNTER — Other Ambulatory Visit: Payer: Self-pay | Admitting: *Deleted

## 2011-12-21 MED ORDER — TOPIRAMATE 100 MG PO TABS: 300.0000 mg | ORAL_TABLET | Freq: Every day | ORAL | Status: DC

## 2011-12-31 DIAGNOSIS — B159 Hepatitis A without hepatic coma: Secondary | ICD-10-CM | POA: Diagnosis not present

## 2011-12-31 DIAGNOSIS — L02419 Cutaneous abscess of limb, unspecified: Secondary | ICD-10-CM | POA: Diagnosis not present

## 2011-12-31 DIAGNOSIS — R569 Unspecified convulsions: Secondary | ICD-10-CM | POA: Diagnosis not present

## 2011-12-31 DIAGNOSIS — L89309 Pressure ulcer of unspecified buttock, unspecified stage: Secondary | ICD-10-CM | POA: Diagnosis not present

## 2011-12-31 DIAGNOSIS — I890 Lymphedema, not elsewhere classified: Secondary | ICD-10-CM | POA: Diagnosis not present

## 2011-12-31 DIAGNOSIS — I509 Heart failure, unspecified: Secondary | ICD-10-CM | POA: Diagnosis not present

## 2011-12-31 DIAGNOSIS — J45909 Unspecified asthma, uncomplicated: Secondary | ICD-10-CM | POA: Diagnosis not present

## 2011-12-31 DIAGNOSIS — E119 Type 2 diabetes mellitus without complications: Secondary | ICD-10-CM | POA: Diagnosis not present

## 2011-12-31 DIAGNOSIS — L03119 Cellulitis of unspecified part of limb: Secondary | ICD-10-CM | POA: Diagnosis not present

## 2011-12-31 DIAGNOSIS — M79609 Pain in unspecified limb: Secondary | ICD-10-CM | POA: Diagnosis not present

## 2012-01-12 ENCOUNTER — Encounter
Payer: Medicare Other | Attending: Physical Medicine and Rehabilitation | Admitting: Physical Medicine and Rehabilitation

## 2012-01-12 ENCOUNTER — Encounter: Payer: Self-pay | Admitting: Physical Medicine and Rehabilitation

## 2012-01-12 VITALS — BP 124/64 | HR 88 | Resp 16 | Ht 70.0 in | Wt 280.0 lb

## 2012-01-12 DIAGNOSIS — L03116 Cellulitis of left lower limb: Secondary | ICD-10-CM

## 2012-01-12 DIAGNOSIS — G894 Chronic pain syndrome: Secondary | ICD-10-CM | POA: Diagnosis not present

## 2012-01-12 DIAGNOSIS — M069 Rheumatoid arthritis, unspecified: Secondary | ICD-10-CM

## 2012-01-12 DIAGNOSIS — L03119 Cellulitis of unspecified part of limb: Secondary | ICD-10-CM | POA: Diagnosis not present

## 2012-01-12 DIAGNOSIS — L02419 Cutaneous abscess of limb, unspecified: Secondary | ICD-10-CM | POA: Diagnosis not present

## 2012-01-12 DIAGNOSIS — G609 Hereditary and idiopathic neuropathy, unspecified: Secondary | ICD-10-CM

## 2012-01-12 DIAGNOSIS — S88119A Complete traumatic amputation at level between knee and ankle, unspecified lower leg, initial encounter: Secondary | ICD-10-CM

## 2012-01-12 DIAGNOSIS — M25569 Pain in unspecified knee: Secondary | ICD-10-CM

## 2012-01-12 DIAGNOSIS — S81009A Unspecified open wound, unspecified knee, initial encounter: Secondary | ICD-10-CM | POA: Diagnosis not present

## 2012-01-12 DIAGNOSIS — M25562 Pain in left knee: Secondary | ICD-10-CM

## 2012-01-12 DIAGNOSIS — S81802A Unspecified open wound, left lower leg, initial encounter: Secondary | ICD-10-CM

## 2012-01-12 DIAGNOSIS — Z89519 Acquired absence of unspecified leg below knee: Secondary | ICD-10-CM

## 2012-01-12 DIAGNOSIS — M1710 Unilateral primary osteoarthritis, unspecified knee: Secondary | ICD-10-CM | POA: Insufficient documentation

## 2012-01-12 DIAGNOSIS — R609 Edema, unspecified: Secondary | ICD-10-CM | POA: Diagnosis not present

## 2012-01-12 DIAGNOSIS — E119 Type 2 diabetes mellitus without complications: Secondary | ICD-10-CM

## 2012-01-12 MED ORDER — FENTANYL 75 MCG/HR TD PT72: 1.0000 | MEDICATED_PATCH | TRANSDERMAL | Status: DC

## 2012-01-12 MED ORDER — OXYCODONE-ACETAMINOPHEN 10-325 MG PO TABS: 1.0000 | ORAL_TABLET | ORAL | Status: DC | PRN

## 2012-01-12 NOTE — Patient Instructions (Signed)
Continue with PT, continue with your exercises at home.

## 2012-01-12 NOTE — Progress Notes (Signed)
Subjective:    Patient ID: Crystal Fitzpatrick, female    DOB: [birth year], 66 y.o.   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 After lymph edema in 2010 she was not able to wear a prosthetic leg anymore. Since then she is in a wheelchair. She reports that she had done  lymph drainage, and that she has followed up with advanced prosthetics, for a prosthetic leg, which she tried for the first time last friday.The patient is doing PT now, for strengthening and walking training.   Pain Inventory Average Pain 6-8 Pain Right Now 6 My pain is intermittent, sharp, burning and stabbing  In the last 24 hours, has pain interfered with the following? General activity 0 Relation with others 0 Enjoyment of life 0 What TIME of day is your pain at its worst? All Day Sleep (in general) Fair  Pain is worse with: walking and inactivity Pain improves with: heat/ice, therapy/exercise, medication and injections Relief from Meds: 4-7  Mobility walk without assistance walk with assistance use a walker use a wheelchair  Function I need assistance with the following:  meal prep and shopping  Neuro/Psych No problems in this area  Prior Studies Any changes since last visit?  no  Physicians involved in your care Any changes since last visit?  no   Family History  Problem Relation Age of Onset  . Breast cancer Sister   . Diabetes Other   . Stroke Other     Grandparents   History   Social History  . Marital Status: Divorced    Spouse Name: N/A    Number of Children: 1  . Years of Education: N/A   Occupational History  . retired Marketing executive    Social History Main Topics  . Smoking status: Former Games developer  . Smokeless tobacco: None  . Alcohol Use: No  . Drug Use: No  . Sexually Active: None   Other Topics Concern  . None   Social History Narrative  . None   Past Surgical History  Procedure Date  . Amputation  02/02/2008    below right knee  . S/p egd 2007    with Botox for? Achalasia  . Tonsillectomy   . S/p breast biopsy 1992  . 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 Tubal ligation   . Nasal septum surgery   . Foot surgury 1980 and 1981   Past Medical History  Diagnosis Date  . DIABETES MELLITUS, TYPE II 07/17/2008  . B12 DEFICIENCY 07/17/2008  . Acute gouty arthropathy 12/11/2008  . HYPONATREMIA 03/20/2010  . HYPERKALEMIA 10/31/2009  . ANEMIA-NOS 07/17/2008  . ANXIETY 07/17/2008  . Chronic pain syndrome 02/14/2010  . Trigeminal neuralgia 02/14/2010  . HEARING LOSS, RIGHT EAR 06/04/2009  . HYPERTENSION 07/17/2008  . CHF 01/28/2009  . Pneumonia, organism unspecified 02/14/2010  . GERD 07/17/2008  . IBS 06/04/2009  . UNSPECIFIED HEPATITIS 01/28/2009  . RENAL INSUFFICIENCY 10/31/2009  . MENOPAUSAL DISORDER 12/03/2009  . Cellulitis and abscess of leg, except foot 11/15/2008  . SKIN ULCER, CHRONIC 02/08/2009  . Rheumatoid arthritis 07/17/2008  . SKIN LESION 06/04/2009  . ARTHRITIS 01/28/2009  . FOOT PAIN 07/17/2008  . HYPERSOMNIA 02/14/2010  . Altered mental status 02/07/2010  . PERIPHERAL EDEMA 08/30/2008  . Wheezing 12/03/2009  . ABDOMINAL DISTENSION 10/11/2008  . Dysuria 11/11/2009  . Hypoxemia 03/20/2010  . ANAPHYLACTIC SHOCK 05/15/2009  . NEUROPATHY, HX OF 01/28/2009  .  COLONIC POLYPS, HX OF 07/17/2008  . AMPUTATION, BELOW KNEE, RIGHT, HX OF 01/28/2009  . Hyperlipemia 09/24/2010  . Hyperlipidemia 09/24/2010  . Seizure 10/26/2010  . Pneumonia, aspiration 12/01/2011    Episode July 2013, tx per [facility]   BP 124/64  Pulse 88  Resp 16  Ht 5\' 10"  (1.778 m)  Wt 280 lb (127.007 kg)  BMI 40.18 kg/m2  SpO2 91%      Review of Systems  Constitutional: Negative.   HENT: Negative.   Eyes: Negative.   Respiratory: Negative.   Cardiovascular: Negative.   Gastrointestinal: Negative.   Genitourinary: Negative.   Musculoskeletal: Positive for gait problem.  Skin: Negative.     Hematological: Negative.   Psychiatric/Behavioral: Negative.        Objective:   Physical Exam Constitutional: She appears well-developed and well-nourished.  HENT:  Head: Normocephalic and atraumatic.  Nose: Nose normal.  Mouth/Throat: Oropharynx is clear and moist.  Eyes: Conjunctivae are normal.  Neck: Normal range of motion. Neck supple.  Cardiovascular: Normal rate and regular rhythm.  Pulmonary/Chest: No respiratory distress. She has no wheezes.  Musculoskeletal: Overall grossly intact 5/5, except right BKA Edema 2+ RLE, 2++LLE. Chronic stasis changes are noted on the left leg.   Weight is stable but she remains morbidly obese  Neurological: A sensory deficit (left foot) is present.  Psychiatric: She has a normal mood and affect. Her behavior is normal. Judgment and thought content normal.         Assessment & Plan:  1. Right below-knee amputation, history of phantom limb pain.  2. Peripheral neuropathy.  3. History of rheumatoid arthritis.  4. Osteoarthritis of left knee, ordered X-ray, we do not have a recent one, and she would like to get a Synvisc injection, which has helped her in the past.  5. Chronic cellulitis, left leg with associated edema.  6. Left gluteal breakdown.  PLAN:  1. Continue lymphedema program, continue with PT and walking program with the new prosthetic leg.   2. Continue wound care per the [facility] wound care clinic. The compression wrap is helping her.  3. Refilled her pain medications today including Percocet 10/325, #130  and fentanyl 75 mcg #10. The patient has tapered down her Percocet, and she still has enough pills for almost one week left ,goal will be to taper meds further .  She will follow up in one month, after X-ray for possible synvisc injection.[name]

## 2012-01-15 ENCOUNTER — Other Ambulatory Visit: Payer: Self-pay | Admitting: Internal Medicine

## 2012-01-15 DIAGNOSIS — L03119 Cellulitis of unspecified part of limb: Secondary | ICD-10-CM | POA: Diagnosis not present

## 2012-01-15 DIAGNOSIS — I509 Heart failure, unspecified: Secondary | ICD-10-CM | POA: Diagnosis not present

## 2012-01-15 DIAGNOSIS — B159 Hepatitis A without hepatic coma: Secondary | ICD-10-CM | POA: Diagnosis not present

## 2012-01-15 DIAGNOSIS — I890 Lymphedema, not elsewhere classified: Secondary | ICD-10-CM | POA: Diagnosis not present

## 2012-01-15 DIAGNOSIS — L89309 Pressure ulcer of unspecified buttock, unspecified stage: Secondary | ICD-10-CM | POA: Diagnosis not present

## 2012-01-15 DIAGNOSIS — J45909 Unspecified asthma, uncomplicated: Secondary | ICD-10-CM | POA: Diagnosis not present

## 2012-01-15 DIAGNOSIS — E119 Type 2 diabetes mellitus without complications: Secondary | ICD-10-CM | POA: Diagnosis not present

## 2012-01-15 DIAGNOSIS — M79609 Pain in unspecified limb: Secondary | ICD-10-CM | POA: Diagnosis not present

## 2012-01-21 DIAGNOSIS — I890 Lymphedema, not elsewhere classified: Secondary | ICD-10-CM | POA: Diagnosis not present

## 2012-01-21 DIAGNOSIS — IMO0001 Reserved for inherently not codable concepts without codable children: Secondary | ICD-10-CM | POA: Diagnosis not present

## 2012-01-21 DIAGNOSIS — R269 Unspecified abnormalities of gait and mobility: Secondary | ICD-10-CM | POA: Diagnosis not present

## 2012-01-21 DIAGNOSIS — M6281 Muscle weakness (generalized): Secondary | ICD-10-CM | POA: Diagnosis not present

## 2012-01-22 DIAGNOSIS — M6281 Muscle weakness (generalized): Secondary | ICD-10-CM | POA: Diagnosis not present

## 2012-01-22 DIAGNOSIS — R269 Unspecified abnormalities of gait and mobility: Secondary | ICD-10-CM | POA: Diagnosis not present

## 2012-01-22 DIAGNOSIS — IMO0001 Reserved for inherently not codable concepts without codable children: Secondary | ICD-10-CM | POA: Diagnosis not present

## 2012-01-22 DIAGNOSIS — I890 Lymphedema, not elsewhere classified: Secondary | ICD-10-CM | POA: Diagnosis not present

## 2012-01-28 ENCOUNTER — Other Ambulatory Visit: Payer: Self-pay | Admitting: *Deleted

## 2012-01-28 MED ORDER — CYCLOBENZAPRINE HCL 10 MG PO TABS: 10.0000 mg | ORAL_TABLET | Freq: Three times a day (TID) | ORAL | Status: DC | PRN

## 2012-02-10 ENCOUNTER — Encounter: Payer: Medicare Other | Admitting: Physical Medicine & Rehabilitation

## 2012-02-18 DIAGNOSIS — B159 Hepatitis A without hepatic coma: Secondary | ICD-10-CM | POA: Diagnosis not present

## 2012-02-18 DIAGNOSIS — J45909 Unspecified asthma, uncomplicated: Secondary | ICD-10-CM | POA: Diagnosis not present

## 2012-02-18 DIAGNOSIS — E119 Type 2 diabetes mellitus without complications: Secondary | ICD-10-CM | POA: Diagnosis not present

## 2012-02-18 DIAGNOSIS — I509 Heart failure, unspecified: Secondary | ICD-10-CM | POA: Diagnosis not present

## 2012-02-18 DIAGNOSIS — R569 Unspecified convulsions: Secondary | ICD-10-CM | POA: Diagnosis not present

## 2012-02-18 DIAGNOSIS — L03119 Cellulitis of unspecified part of limb: Secondary | ICD-10-CM | POA: Diagnosis not present

## 2012-02-18 DIAGNOSIS — L97509 Non-pressure chronic ulcer of other part of unspecified foot with unspecified severity: Secondary | ICD-10-CM | POA: Diagnosis not present

## 2012-02-18 DIAGNOSIS — E669 Obesity, unspecified: Secondary | ICD-10-CM | POA: Diagnosis not present

## 2012-02-18 DIAGNOSIS — I890 Lymphedema, not elsewhere classified: Secondary | ICD-10-CM | POA: Diagnosis not present

## 2012-02-18 DIAGNOSIS — L02419 Cutaneous abscess of limb, unspecified: Secondary | ICD-10-CM | POA: Diagnosis not present

## 2012-02-19 ENCOUNTER — Other Ambulatory Visit: Payer: Self-pay | Admitting: *Deleted

## 2012-02-19 ENCOUNTER — Telehealth: Payer: Self-pay | Admitting: Internal Medicine

## 2012-02-19 MED ORDER — GABAPENTIN 600 MG PO TABS: 600.0000 mg | ORAL_TABLET | Freq: Three times a day (TID) | ORAL | Status: DC

## 2012-02-19 MED ORDER — CEPHALEXIN 500 MG PO CAPS: 500.0000 mg | ORAL_CAPSULE | Freq: Four times a day (QID) | ORAL | Status: DC

## 2012-02-19 NOTE — Telephone Encounter (Signed)
Done erx - cephalexin

## 2012-02-19 NOTE — Telephone Encounter (Signed)
Informed care giver rx sent in
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
## 2012-02-22 ENCOUNTER — Telehealth: Payer: Self-pay | Admitting: *Deleted

## 2012-02-22 NOTE — Telephone Encounter (Signed)
I did not get xray -should she still see Riley Kill or someone else.  msg left: We are just going to leave appointment as is.

## 2012-02-24 ENCOUNTER — Encounter: Payer: Self-pay | Admitting: Physical Medicine & Rehabilitation

## 2012-02-24 ENCOUNTER — Encounter: Payer: Medicare Other | Attending: Physical Medicine and Rehabilitation | Admitting: Physical Medicine & Rehabilitation

## 2012-02-24 VITALS — Ht 69.0 in | Wt 280.0 lb

## 2012-02-24 DIAGNOSIS — S81802A Unspecified open wound, left lower leg, initial encounter: Secondary | ICD-10-CM

## 2012-02-24 DIAGNOSIS — I10 Essential (primary) hypertension: Secondary | ICD-10-CM | POA: Diagnosis not present

## 2012-02-24 DIAGNOSIS — M069 Rheumatoid arthritis, unspecified: Secondary | ICD-10-CM | POA: Diagnosis not present

## 2012-02-24 DIAGNOSIS — E785 Hyperlipidemia, unspecified: Secondary | ICD-10-CM | POA: Insufficient documentation

## 2012-02-24 DIAGNOSIS — I509 Heart failure, unspecified: Secondary | ICD-10-CM

## 2012-02-24 DIAGNOSIS — M25569 Pain in unspecified knee: Secondary | ICD-10-CM | POA: Insufficient documentation

## 2012-02-24 DIAGNOSIS — E119 Type 2 diabetes mellitus without complications: Secondary | ICD-10-CM | POA: Diagnosis not present

## 2012-02-24 DIAGNOSIS — M1712 Unilateral primary osteoarthritis, left knee: Secondary | ICD-10-CM

## 2012-02-24 DIAGNOSIS — M1710 Unilateral primary osteoarthritis, unspecified knee: Secondary | ICD-10-CM | POA: Diagnosis not present

## 2012-02-24 DIAGNOSIS — S88119A Complete traumatic amputation at level between knee and ankle, unspecified lower leg, initial encounter: Secondary | ICD-10-CM

## 2012-02-24 DIAGNOSIS — G609 Hereditary and idiopathic neuropathy, unspecified: Secondary | ICD-10-CM

## 2012-02-24 DIAGNOSIS — K219 Gastro-esophageal reflux disease without esophagitis: Secondary | ICD-10-CM | POA: Insufficient documentation

## 2012-02-24 DIAGNOSIS — L03116 Cellulitis of left lower limb: Secondary | ICD-10-CM

## 2012-02-24 DIAGNOSIS — G546 Phantom limb syndrome with pain: Secondary | ICD-10-CM

## 2012-02-24 MED ORDER — FENTANYL 75 MCG/HR TD PT72: 1.0000 | MEDICATED_PATCH | TRANSDERMAL | Status: DC

## 2012-02-24 MED ORDER — OXYCODONE-ACETAMINOPHEN 10-325 MG PO TABS: 1.0000 | ORAL_TABLET | ORAL | Status: DC | PRN

## 2012-02-24 NOTE — Progress Notes (Signed)
Subjective:    Patient ID: Crystal Fitzpatrick, female    DOB: 08-15-1945, 66 y.o.   MRN: [id number]  HPI  Significant left knee medial compartment OA on xrays which the patient brought with her today.  Pain Inventory Average Pain 5 Pain Right Now 6 My pain is intermittent, constant, sharp, burning, tingling and aching  In the last 24 hours, has pain interfered with the following? General activity 2 Relation with others 1 Enjoyment of life 1 What TIME of day is your pain at its worst? morning and night Sleep (in general) Fair  Pain is worse with: walking and inactivity Pain improves with: therapy/exercise, medication and TENS Relief from Meds: 6  Mobility use a wheelchair transfers alone  Function I need assistance with the following:  bathing, meal prep, household duties and shopping  Neuro/Psych numbness tremor tingling  Prior Studies Any changes since last visit?  yes x-rays of knees   Physicians involved in your care Any changes since last visit?  no   Family History  Problem Relation Age of Onset  . Breast cancer Sister   . Diabetes Other   . Stroke Other     Grandparents   History   Social History  . Marital Status: Divorced    Spouse Name: N/A    Number of Children: 1  . Years of Education: N/A   Occupational History  . retired Marketing executive    Social History Main Topics  . Smoking status: Former Games developer  . Smokeless tobacco: Never Used  . Alcohol Use: No  . Drug Use: No  . Sexually Active: None   Other Topics Concern  . None   Social History Narrative  . None   Past Surgical History  Procedure Date  . Amputation 02/02/2008    below right knee  . S/p egd 2007    with Botox for? Achalasia  . Tonsillectomy   . S/p breast biopsy 1992  . 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 Tubal ligation   . Nasal septum surgery   . Foot surgury 1980 and 1981   Past Medical History  Diagnosis Date  . DIABETES MELLITUS, TYPE  II 07/17/2008  . B12 DEFICIENCY 07/17/2008  . Acute gouty arthropathy 12/11/2008  . HYPONATREMIA 03/20/2010  . HYPERKALEMIA 10/31/2009  . ANEMIA-NOS 07/17/2008  . ANXIETY 07/17/2008  . Chronic pain syndrome 02/14/2010  . Trigeminal neuralgia 02/14/2010  . HEARING LOSS, RIGHT EAR 06/04/2009  . HYPERTENSION 07/17/2008  . CHF 01/28/2009  . Pneumonia, organism unspecified 02/14/2010  . GERD 07/17/2008  . IBS 06/04/2009  . UNSPECIFIED HEPATITIS 01/28/2009  . RENAL INSUFFICIENCY 10/31/2009  . MENOPAUSAL DISORDER 12/03/2009  . Cellulitis and abscess of leg, except foot 11/15/2008  . SKIN ULCER, CHRONIC 02/08/2009  . Rheumatoid arthritis 07/17/2008  . SKIN LESION 06/04/2009  . ARTHRITIS 01/28/2009  . FOOT PAIN 07/17/2008  . HYPERSOMNIA 02/14/2010  . Altered mental status 02/07/2010  . PERIPHERAL EDEMA 08/30/2008  . Wheezing 12/03/2009  . ABDOMINAL DISTENSION 10/11/2008  . Dysuria 11/11/2009  . Hypoxemia 03/20/2010  . ANAPHYLACTIC SHOCK 05/15/2009  . NEUROPATHY, HX OF 01/28/2009  . COLONIC POLYPS, HX OF 07/17/2008  . AMPUTATION, BELOW KNEE, RIGHT, HX OF 01/28/2009  . Hyperlipemia 09/24/2010  . Hyperlipidemia 09/24/2010  . Seizure 10/26/2010  . Pneumonia, aspiration 12/01/2011    Episode July 2013, tx per [facility]   Ht 5\' 9"  (1.753 m)  Wt 280 lb (127.007 kg)  BMI  41.35 kg/m2  BP  127/50    P  95   R 14 02 Sat 95%    Review of Systems  Musculoskeletal:       Knee pain  Neurological: Positive for tremors and numbness.       Tingling  All other systems reviewed and are negative.       Objective:   Physical Exam  Continued medial compartment pain in the right knee     Assessment & Plan:  PROCEDURE:  MAJOR JOINT INJECTION:  After informed consent and sterile preparation of the left knee with betadine i injected 2cc of synvisc solution via medial approach after aspiration. The patient tolerated well. The area was cleaned and dressed. She will return for the 2nd of three injections in about one  week.

## 2012-02-25 ENCOUNTER — Encounter: Payer: Self-pay | Admitting: Physical Medicine & Rehabilitation

## 2012-03-07 ENCOUNTER — Encounter (HOSPITAL_BASED_OUTPATIENT_CLINIC_OR_DEPARTMENT_OTHER): Payer: Medicare Other | Admitting: Physical Medicine & Rehabilitation

## 2012-03-07 ENCOUNTER — Encounter: Payer: Self-pay | Admitting: Physical Medicine & Rehabilitation

## 2012-03-07 VITALS — BP 146/77 | HR 88 | Resp 14 | Ht 70.0 in | Wt 280.0 lb

## 2012-03-07 DIAGNOSIS — M1712 Unilateral primary osteoarthritis, left knee: Secondary | ICD-10-CM

## 2012-03-07 DIAGNOSIS — M47817 Spondylosis without myelopathy or radiculopathy, lumbosacral region: Secondary | ICD-10-CM | POA: Diagnosis not present

## 2012-03-07 DIAGNOSIS — S88119A Complete traumatic amputation at level between knee and ankle, unspecified lower leg, initial encounter: Secondary | ICD-10-CM | POA: Diagnosis not present

## 2012-03-07 DIAGNOSIS — K219 Gastro-esophageal reflux disease without esophagitis: Secondary | ICD-10-CM | POA: Diagnosis not present

## 2012-03-07 DIAGNOSIS — M25569 Pain in unspecified knee: Secondary | ICD-10-CM | POA: Diagnosis not present

## 2012-03-07 DIAGNOSIS — M069 Rheumatoid arthritis, unspecified: Secondary | ICD-10-CM | POA: Diagnosis not present

## 2012-03-07 DIAGNOSIS — M47816 Spondylosis without myelopathy or radiculopathy, lumbar region: Secondary | ICD-10-CM

## 2012-03-07 DIAGNOSIS — I10 Essential (primary) hypertension: Secondary | ICD-10-CM | POA: Diagnosis not present

## 2012-03-07 DIAGNOSIS — E119 Type 2 diabetes mellitus without complications: Secondary | ICD-10-CM | POA: Diagnosis not present

## 2012-03-07 DIAGNOSIS — M1710 Unilateral primary osteoarthritis, unspecified knee: Secondary | ICD-10-CM | POA: Diagnosis not present

## 2012-03-07 MED ORDER — DICLOFENAC SODIUM 1 % TD GEL: 1.0000 "application " | Freq: Three times a day (TID) | TRANSDERMAL | Status: DC

## 2012-03-07 NOTE — Progress Notes (Signed)
Subjective:    Patient ID: Crystal Fitzpatrick, female    DOB: Jun 09, 1945, 66 y.o.   MRN: [id number]  HPI  Crystal Fitzpatrick is here for  Her second synvisc  Pain Inventory Average Pain 5 Pain Right Now 4 My pain is constant, sharp, burning and aching  In the last 24 hours, has pain interfered with the following? General activity 6 Relation with others 3 Enjoyment of life 2 What TIME of day is your pain at its worst? morning and night Sleep (in general) Poor  Pain is worse with: sitting and inactivity Pain improves with: therapy/exercise, medication and injections Relief from Meds: 8  Mobility how many minutes can you walk? 0 ability to climb steps?  no do you drive?  no use a wheelchair  Function disabled: date disabled  I need assistance with the following:  bathing, meal prep, household duties and shopping  Neuro/Psych numbness tremor tingling  Prior Studies Any changes since last visit?  no  Physicians involved in your care Any changes since last visit?  no   Family History  Problem Relation Age of Onset  . Breast cancer Sister   . Diabetes Other   . Stroke Other     Grandparents   History   Social History  . Marital Status: Divorced    Spouse Name: N/A    Number of Children: 1  . Years of Education: N/A   Occupational History  . retired Marketing executive    Social History Main Topics  . Smoking status: Former Games developer  . Smokeless tobacco: Never Used  . Alcohol Use: No  . Drug Use: No  . Sexually Active: None   Other Topics Concern  . None   Social History Narrative  . None   Past Surgical History  Procedure Date  . Amputation 02/02/2008    below right knee  . S/p egd 2007    with Botox for? Achalasia  . Tonsillectomy   . S/p breast biopsy 1992  . 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 Tubal ligation   . Nasal septum surgery   . Foot surgury 1980 and 1981   Past Medical History  Diagnosis Date  . DIABETES MELLITUS, TYPE  II 07/17/2008  . B12 DEFICIENCY 07/17/2008  . Acute gouty arthropathy 12/11/2008  . HYPONATREMIA 03/20/2010  . HYPERKALEMIA 10/31/2009  . ANEMIA-NOS 07/17/2008  . ANXIETY 07/17/2008  . Chronic pain syndrome 02/14/2010  . Trigeminal neuralgia 02/14/2010  . HEARING LOSS, RIGHT EAR 06/04/2009  . HYPERTENSION 07/17/2008  . CHF 01/28/2009  . Pneumonia, organism unspecified 02/14/2010  . GERD 07/17/2008  . IBS 06/04/2009  . UNSPECIFIED HEPATITIS 01/28/2009  . RENAL INSUFFICIENCY 10/31/2009  . MENOPAUSAL DISORDER 12/03/2009  . Cellulitis and abscess of leg, except foot 11/15/2008  . SKIN ULCER, CHRONIC 02/08/2009  . Rheumatoid arthritis 07/17/2008  . SKIN LESION 06/04/2009  . ARTHRITIS 01/28/2009  . FOOT PAIN 07/17/2008  . HYPERSOMNIA 02/14/2010  . Altered mental status 02/07/2010  . PERIPHERAL EDEMA 08/30/2008  . Wheezing 12/03/2009  . ABDOMINAL DISTENSION 10/11/2008  . Dysuria 11/11/2009  . Hypoxemia 03/20/2010  . ANAPHYLACTIC SHOCK 05/15/2009  . NEUROPATHY, HX OF 01/28/2009  . COLONIC POLYPS, HX OF 07/17/2008  . AMPUTATION, BELOW KNEE, RIGHT, HX OF 01/28/2009  . Hyperlipemia 09/24/2010  . Hyperlipidemia 09/24/2010  . Seizure 10/26/2010  . Pneumonia, aspiration 12/01/2011    Episode July 2013, tx per [facility]   BP 146/77  Pulse 88  Resp 14  Ht 5\' 10"  (1.778 m)  Wt 280 lb (127.007 kg)  BMI 40.18 kg/m2  SpO2 90%     Review of Systems  Neurological: Positive for numbness.  All other systems reviewed and are negative.       Objective:   Physical Exam Physical Exam  Continued medial compartment pain in the right knee   Assessment & Plan:   PROCEDURE:  MAJOR JOINT INJECTION:  After informed consent and sterile preparation of the left knee with betadine i injected 2cc of synvisc solution via medial approach after aspiration. The patient tolerated well. The area was cleaned and dressed. She will return for the 3rd of three injections in about one week  I also wrote her an rx for voltaren gel  and for outpt PT at [facility]

## 2012-03-07 NOTE — Patient Instructions (Signed)
Call me with questions 

## 2012-03-14 ENCOUNTER — Telehealth: Payer: Self-pay

## 2012-03-14 NOTE — Telephone Encounter (Signed)
Crystal Fitzpatrick at rehab needs PT orders for patient.  Please fax to [phone].  She has an appt 03/15/12 in the afternoon.

## 2012-03-15 DIAGNOSIS — I890 Lymphedema, not elsewhere classified: Secondary | ICD-10-CM | POA: Diagnosis not present

## 2012-03-15 DIAGNOSIS — M6281 Muscle weakness (generalized): Secondary | ICD-10-CM | POA: Diagnosis not present

## 2012-03-15 DIAGNOSIS — R269 Unspecified abnormalities of gait and mobility: Secondary | ICD-10-CM | POA: Diagnosis not present

## 2012-03-15 DIAGNOSIS — IMO0001 Reserved for inherently not codable concepts without codable children: Secondary | ICD-10-CM | POA: Diagnosis not present

## 2012-03-15 DIAGNOSIS — M549 Dorsalgia, unspecified: Secondary | ICD-10-CM | POA: Diagnosis not present

## 2012-03-16 ENCOUNTER — Encounter: Payer: Medicare Other | Admitting: Physical Medicine & Rehabilitation

## 2012-03-18 ENCOUNTER — Encounter: Payer: Self-pay | Admitting: Physical Medicine & Rehabilitation

## 2012-03-18 ENCOUNTER — Encounter: Payer: Medicare Other | Attending: Physical Medicine and Rehabilitation | Admitting: Physical Medicine & Rehabilitation

## 2012-03-18 VITALS — BP 124/53 | HR 80 | Resp 16 | Ht 70.0 in | Wt 280.0 lb

## 2012-03-18 DIAGNOSIS — S88119A Complete traumatic amputation at level between knee and ankle, unspecified lower leg, initial encounter: Secondary | ICD-10-CM | POA: Insufficient documentation

## 2012-03-18 DIAGNOSIS — M1712 Unilateral primary osteoarthritis, left knee: Secondary | ICD-10-CM

## 2012-03-18 DIAGNOSIS — G609 Hereditary and idiopathic neuropathy, unspecified: Secondary | ICD-10-CM | POA: Insufficient documentation

## 2012-03-18 DIAGNOSIS — I10 Essential (primary) hypertension: Secondary | ICD-10-CM | POA: Insufficient documentation

## 2012-03-18 DIAGNOSIS — E119 Type 2 diabetes mellitus without complications: Secondary | ICD-10-CM | POA: Insufficient documentation

## 2012-03-18 DIAGNOSIS — K219 Gastro-esophageal reflux disease without esophagitis: Secondary | ICD-10-CM | POA: Diagnosis not present

## 2012-03-18 DIAGNOSIS — M069 Rheumatoid arthritis, unspecified: Secondary | ICD-10-CM

## 2012-03-18 DIAGNOSIS — M25569 Pain in unspecified knee: Secondary | ICD-10-CM | POA: Diagnosis not present

## 2012-03-18 DIAGNOSIS — M1710 Unilateral primary osteoarthritis, unspecified knee: Secondary | ICD-10-CM | POA: Diagnosis not present

## 2012-03-18 DIAGNOSIS — E785 Hyperlipidemia, unspecified: Secondary | ICD-10-CM | POA: Diagnosis not present

## 2012-03-18 DIAGNOSIS — L03116 Cellulitis of left lower limb: Secondary | ICD-10-CM

## 2012-03-18 DIAGNOSIS — S81802A Unspecified open wound, left lower leg, initial encounter: Secondary | ICD-10-CM

## 2012-03-18 MED ORDER — OXYCODONE-ACETAMINOPHEN 10-325 MG PO TABS: 1.0000 | ORAL_TABLET | ORAL | Status: DC | PRN

## 2012-03-18 MED ORDER — FENTANYL 75 MCG/HR TD PT72: 1.0000 | MEDICATED_PATCH | TRANSDERMAL | Status: DC

## 2012-03-18 NOTE — Patient Instructions (Signed)
DON'T DO ANYTHING I WOULDN'T DO!!!!!  :)

## 2012-03-18 NOTE — Progress Notes (Signed)
Subjective:    Patient ID: Crystal Fitzpatrick, female    DOB: 07-Mar-1946, 66 y.o.   MRN: [id number]  HPI  Pain Inventory Average Pain 7 Pain Right Now 7 My pain is constant  In the last 24 hours, has pain interfered with the following? General activity 6 Relation with others 5 Enjoyment of life 5 What TIME of day is your pain at its worst? morning and night Sleep (in general) Poor  Pain is worse with: inactivity and standing Pain improves with: rest, heat/ice, therapy/exercise, medication and injections Relief from Meds: 7  Mobility how many minutes can you walk? n/a ability to climb steps?  no do you drive?  no use a wheelchair transfers alone  Function disabled: date disabled   Neuro/Psych No problems in this area  Prior Studies Any changes since last visit?  no  Physicians involved in your care Any changes since last visit?  no   Family History  Problem Relation Age of Onset  . Breast cancer Sister   . Diabetes Other   . Stroke Other     Grandparents   History   Social History  . Marital Status: Divorced    Spouse Name: N/A    Number of Children: 1  . Years of Education: N/A   Occupational History  . retired Marketing executive    Social History Main Topics  . Smoking status: Former Games developer  . Smokeless tobacco: Never Used  . Alcohol Use: No  . Drug Use: No  . Sexually Active: None   Other Topics Concern  . None   Social History Narrative  . None   Past Surgical History  Procedure Date  . Amputation 02/02/2008    below right knee  . S/p egd 2007    with Botox for? Achalasia  . Tonsillectomy   . S/p breast biopsy 1992  . 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 Tubal ligation   . Nasal septum surgery   . Foot surgury 1980 and 1981   Past Medical History  Diagnosis Date  . DIABETES MELLITUS, TYPE II 07/17/2008  . B12 DEFICIENCY 07/17/2008  . Acute gouty arthropathy 12/11/2008  . HYPONATREMIA 03/20/2010  . HYPERKALEMIA  10/31/2009  . ANEMIA-NOS 07/17/2008  . ANXIETY 07/17/2008  . Chronic pain syndrome 02/14/2010  . Trigeminal neuralgia 02/14/2010  . HEARING LOSS, RIGHT EAR 06/04/2009  . HYPERTENSION 07/17/2008  . CHF 01/28/2009  . Pneumonia, organism unspecified 02/14/2010  . GERD 07/17/2008  . IBS 06/04/2009  . UNSPECIFIED HEPATITIS 01/28/2009  . RENAL INSUFFICIENCY 10/31/2009  . MENOPAUSAL DISORDER 12/03/2009  . Cellulitis and abscess of leg, except foot 11/15/2008  . SKIN ULCER, CHRONIC 02/08/2009  . Rheumatoid arthritis 07/17/2008  . SKIN LESION 06/04/2009  . ARTHRITIS 01/28/2009  . FOOT PAIN 07/17/2008  . HYPERSOMNIA 02/14/2010  . Altered mental status 02/07/2010  . PERIPHERAL EDEMA 08/30/2008  . Wheezing 12/03/2009  . ABDOMINAL DISTENSION 10/11/2008  . Dysuria 11/11/2009  . Hypoxemia 03/20/2010  . ANAPHYLACTIC SHOCK 05/15/2009  . NEUROPATHY, HX OF 01/28/2009  . COLONIC POLYPS, HX OF 07/17/2008  . AMPUTATION, BELOW KNEE, RIGHT, HX OF 01/28/2009  . Hyperlipemia 09/24/2010  . Hyperlipidemia 09/24/2010  . Seizure 10/26/2010  . Pneumonia, aspiration 12/01/2011    Episode July 2013, tx per [facility]   BP 124/53  Pulse 80  Resp 16  Ht 5\' 10"  (1.778 m)  Wt 280 lb (127.007 kg)  BMI 40.18 kg/m2  SpO2 93%  Review of Systems  Musculoskeletal: Positive for myalgias and arthralgias.  All other systems reviewed and are negative.       Objective:   Physical Exam        Assessment & Plan:  PROCEDURE:  MAJOR JOINT INJECTION:  After informed consent and sterile preparation of the left knee with betadine i injected 2cc of synvisc solution via medial approach after aspiration. The patient tolerated well. The area was cleaned and dressed.  Rx's were provided today including fentanyl and oxycodone which are due in about a week.

## 2012-03-24 ENCOUNTER — Other Ambulatory Visit: Payer: Self-pay

## 2012-03-24 MED ORDER — DULOXETINE HCL 60 MG PO CPEP: 120.0000 mg | ORAL_CAPSULE | Freq: Every day | ORAL | Status: DC

## 2012-03-24 MED ORDER — CYCLOBENZAPRINE HCL 10 MG PO TABS: 10.0000 mg | ORAL_TABLET | Freq: Three times a day (TID) | ORAL | Status: DC | PRN

## 2012-03-28 ENCOUNTER — Telehealth: Payer: Self-pay | Admitting: Physical Medicine & Rehabilitation

## 2012-03-28 DIAGNOSIS — R269 Unspecified abnormalities of gait and mobility: Secondary | ICD-10-CM

## 2012-03-28 DIAGNOSIS — G546 Phantom limb syndrome with pain: Secondary | ICD-10-CM

## 2012-03-28 NOTE — Telephone Encounter (Signed)
Shawna Orleans thinks patient would benefit more from home health therapy.  Referral placed.

## 2012-03-28 NOTE — Telephone Encounter (Signed)
Had PT eval and has not been able to return due to lack of transportation.  Feels patient is homebound and is recommending HH PT.

## 2012-04-20 ENCOUNTER — Encounter: Payer: Self-pay | Admitting: Physical Medicine & Rehabilitation

## 2012-04-20 ENCOUNTER — Encounter: Payer: Medicare Other | Attending: Physical Medicine & Rehabilitation | Admitting: Physical Medicine & Rehabilitation

## 2012-04-20 VITALS — BP 129/58 | HR 97 | Ht 70.0 in | Wt 280.0 lb

## 2012-04-20 DIAGNOSIS — M069 Rheumatoid arthritis, unspecified: Secondary | ICD-10-CM

## 2012-04-20 DIAGNOSIS — IMO0002 Reserved for concepts with insufficient information to code with codable children: Secondary | ICD-10-CM

## 2012-04-20 DIAGNOSIS — S88119A Complete traumatic amputation at level between knee and ankle, unspecified lower leg, initial encounter: Secondary | ICD-10-CM | POA: Diagnosis not present

## 2012-04-20 DIAGNOSIS — M1710 Unilateral primary osteoarthritis, unspecified knee: Secondary | ICD-10-CM | POA: Insufficient documentation

## 2012-04-20 DIAGNOSIS — S81009A Unspecified open wound, unspecified knee, initial encounter: Secondary | ICD-10-CM

## 2012-04-20 DIAGNOSIS — G547 Phantom limb syndrome without pain: Secondary | ICD-10-CM | POA: Diagnosis not present

## 2012-04-20 DIAGNOSIS — M47816 Spondylosis without myelopathy or radiculopathy, lumbar region: Secondary | ICD-10-CM

## 2012-04-20 DIAGNOSIS — G609 Hereditary and idiopathic neuropathy, unspecified: Secondary | ICD-10-CM

## 2012-04-20 DIAGNOSIS — G546 Phantom limb syndrome with pain: Secondary | ICD-10-CM

## 2012-04-20 DIAGNOSIS — L03119 Cellulitis of unspecified part of limb: Secondary | ICD-10-CM | POA: Diagnosis not present

## 2012-04-20 DIAGNOSIS — L02419 Cutaneous abscess of limb, unspecified: Secondary | ICD-10-CM

## 2012-04-20 DIAGNOSIS — S81802A Unspecified open wound, left lower leg, initial encounter: Secondary | ICD-10-CM

## 2012-04-20 DIAGNOSIS — E119 Type 2 diabetes mellitus without complications: Secondary | ICD-10-CM

## 2012-04-20 DIAGNOSIS — L03116 Cellulitis of left lower limb: Secondary | ICD-10-CM

## 2012-04-20 DIAGNOSIS — M47817 Spondylosis without myelopathy or radiculopathy, lumbosacral region: Secondary | ICD-10-CM | POA: Diagnosis not present

## 2012-04-20 DIAGNOSIS — M1712 Unilateral primary osteoarthritis, left knee: Secondary | ICD-10-CM

## 2012-04-20 MED ORDER — OXYCODONE-ACETAMINOPHEN 10-325 MG PO TABS: 1.0000 | ORAL_TABLET | ORAL | Status: DC | PRN

## 2012-04-20 MED ORDER — FENTANYL 75 MCG/HR TD PT72: 1.0000 | MEDICATED_PATCH | TRANSDERMAL | Status: DC

## 2012-04-20 NOTE — Progress Notes (Signed)
Subjective:    Patient ID: Crystal Fitzpatrick, female    DOB: [birth year], 66 y.o.   MRN: [id number]  HPI  Mrs. Kelty is back regarding her multiple pain issues. The synvisc injections were very helpful for her left knee pain. She is interested in a powered mobility device particularly outside the home. She wants to go for therapy to strengthen herself. She is having difficulty finding transportation to therapy however.  She continues at [facility] wound care clinic for her left leg.     Pain Inventory Average Pain 6 Pain Right Now 6 My pain is sharp, burning and aching  In the last 24 hours, has pain interfered with the following? General activity 5 Relation with others 2 Enjoyment of life 3 What TIME of day is your pain at its worst? varies Sleep (in general) Fair  Pain is worse with: some activites Pain improves with: rest, therapy/exercise, pacing activities, medication and injections Relief from Meds: 6  Mobility walk with assistance ability to climb steps?  no do you drive?  no use a wheelchair  Function disabled: date disabled  I need assistance with the following:  meal prep, household duties and shopping  Neuro/Psych tremor  Prior Studies Any changes since last visit?  no  Physicians involved in your care Any changes since last visit?  no   Family History  Problem Relation Age of Onset  . Breast cancer Sister   . Diabetes Other   . Stroke Other     Grandparents   History   Social History  . Marital Status: Divorced    Spouse Name: N/A    Number of Children: 1  . Years of Education: N/A   Occupational History  . retired Marketing executive    Social History Main Topics  . Smoking status: Former Games developer  . Smokeless tobacco: Never Used  . Alcohol Use: No  . Drug Use: No  . Sexually Active: None   Other Topics Concern  . None   Social History Narrative  . None   Past Surgical History  Procedure Date  . Amputation 02/02/2008    below  right knee  . S/p egd 2007    with Botox for? Achalasia  . Tonsillectomy   . S/p breast biopsy 1992  . 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 Tubal ligation   . Nasal septum surgery   . Foot surgury 1980 and 1981   Past Medical History  Diagnosis Date  . DIABETES MELLITUS, TYPE II 07/17/2008  . B12 DEFICIENCY 07/17/2008  . Acute gouty arthropathy 12/11/2008  . HYPONATREMIA 03/20/2010  . HYPERKALEMIA 10/31/2009  . ANEMIA-NOS 07/17/2008  . ANXIETY 07/17/2008  . Chronic pain syndrome 02/14/2010  . Trigeminal neuralgia 02/14/2010  . HEARING LOSS, RIGHT EAR 06/04/2009  . HYPERTENSION 07/17/2008  . CHF 01/28/2009  . Pneumonia, organism unspecified 02/14/2010  . GERD 07/17/2008  . IBS 06/04/2009  . UNSPECIFIED HEPATITIS 01/28/2009  . RENAL INSUFFICIENCY 10/31/2009  . MENOPAUSAL DISORDER 12/03/2009  . Cellulitis and abscess of leg, except foot 11/15/2008  . SKIN ULCER, CHRONIC 02/08/2009  . Rheumatoid arthritis 07/17/2008  . SKIN LESION 06/04/2009  . ARTHRITIS 01/28/2009  . FOOT PAIN 07/17/2008  . HYPERSOMNIA 02/14/2010  . Altered mental status 02/07/2010  . PERIPHERAL EDEMA 08/30/2008  . Wheezing 12/03/2009  . ABDOMINAL DISTENSION 10/11/2008  . Dysuria 11/11/2009  . Hypoxemia 03/20/2010  . ANAPHYLACTIC SHOCK 05/15/2009  . NEUROPATHY, HX OF 01/28/2009  . COLONIC POLYPS, HX  OF 07/17/2008  . AMPUTATION, BELOW KNEE, RIGHT, HX OF 01/28/2009  . Hyperlipemia 09/24/2010  . Hyperlipidemia 09/24/2010  . Seizure 10/26/2010  . Pneumonia, aspiration 12/01/2011    Episode July 2013, tx per [facility]   BP 129/58  Pulse 97  Ht 5\' 10"  (1.778 m)  Wt 280 lb (127.007 kg)  BMI 40.18 kg/m2  SpO2 91%    Review of Systems  Gastrointestinal: Positive for nausea and diarrhea.  Musculoskeletal: Positive for back pain.  Skin: Positive for wound.  All other systems reviewed and are negative.       Objective:   Physical Exam Constitutional: She appears well-developed and well-nourished.  HENT:  Head:  Normocephalic and atraumatic.  Nose: Nose normal.  Mouth/Throat: Oropharynx is clear and moist.  Eyes: Conjunctivae are normal.  Neck: Normal range of motion. Neck supple.  Cardiovascular: Normal rate and regular rhythm.  Pulmonary/Chest: No respiratory distress. She has no wheezes.  Musculoskeletal:  Edema 2+ RLE, 2++LLE. Chronic stasis changes are noted on the left leg. Leg is dressed. Weight is stable but she remains morbidly obese  Neurological: A sensory deficit (left foot) is present.  Psychiatric: She has a normal mood and affect. Her behavior is normal. Judgment and thought content normal.   Assessment & Plan:   ASSESSMENT:  1. Right below-knee amputation, history of phantom limb pain.  2. Peripheral neuropathy.  3. History of rheumatoid arthritis.  4. Osteoarthritis of left knee-improved after synvisc 5. Chronic cellulitis, left leg with associated edema.  6. Left gluteal breakdown-resolved     PLAN:  1. Continue compression stocking to right leg. I encouraged double  wrapping the distal portions to encourage increased edema control.  -lymphedema program consult was made  2. Continue wound care per the [facility] wound care clinic. The compression wrap is helping her.  3. Discussed light open kinetic chain exercises for the left knee. She needs to strengthen her leg but at the same time limit impact. Aquatic therapy would be ideal once her wounds are healed. 4. Refilled her pain medications today including Percocet 10/325, #130  and fentanyl 75 mcg #10. Discussed the importance of being careful with her meds.  -still could consider pain psychology consult with Dr. Gennie Alma  -goal will be to taper meds  6.She will follow up with my PA in one month.

## 2012-04-20 NOTE — Patient Instructions (Signed)
CONTINUE YOUR EXERCISES AND RANGE OF MOTION.

## 2012-04-22 ENCOUNTER — Telehealth: Payer: Self-pay | Admitting: *Deleted

## 2012-04-22 DIAGNOSIS — IMO0001 Reserved for inherently not codable concepts without codable children: Secondary | ICD-10-CM | POA: Diagnosis not present

## 2012-04-22 DIAGNOSIS — S88119A Complete traumatic amputation at level between knee and ankle, unspecified lower leg, initial encounter: Secondary | ICD-10-CM | POA: Diagnosis not present

## 2012-04-22 DIAGNOSIS — I509 Heart failure, unspecified: Secondary | ICD-10-CM | POA: Diagnosis not present

## 2012-04-22 DIAGNOSIS — M47817 Spondylosis without myelopathy or radiculopathy, lumbosacral region: Secondary | ICD-10-CM | POA: Diagnosis not present

## 2012-04-22 DIAGNOSIS — E119 Type 2 diabetes mellitus without complications: Secondary | ICD-10-CM | POA: Diagnosis not present

## 2012-04-22 NOTE — Telephone Encounter (Signed)
Called and notified Neysa Bonito that orders were just for PT.

## 2012-04-22 NOTE — Telephone Encounter (Signed)
Christy with [facility] in [place] needs clarification on orders for patient. Is it for Nursing and PT or just PT?

## 2012-04-25 DIAGNOSIS — E119 Type 2 diabetes mellitus without complications: Secondary | ICD-10-CM | POA: Diagnosis not present

## 2012-04-25 DIAGNOSIS — I509 Heart failure, unspecified: Secondary | ICD-10-CM | POA: Diagnosis not present

## 2012-04-25 DIAGNOSIS — IMO0001 Reserved for inherently not codable concepts without codable children: Secondary | ICD-10-CM | POA: Diagnosis not present

## 2012-04-25 DIAGNOSIS — S88119A Complete traumatic amputation at level between knee and ankle, unspecified lower leg, initial encounter: Secondary | ICD-10-CM | POA: Diagnosis not present

## 2012-04-25 DIAGNOSIS — M47817 Spondylosis without myelopathy or radiculopathy, lumbosacral region: Secondary | ICD-10-CM | POA: Diagnosis not present

## 2012-04-26 ENCOUNTER — Telehealth: Payer: Self-pay | Admitting: *Deleted

## 2012-04-26 NOTE — Telephone Encounter (Signed)
Crystal Fitzpatrick from [place] was out to see Crystal Fitzpatrick on Friday and Monday and they are concerned because she seemed to be confused and her stories about when she got her prosthetic and why she had not been wearing it were not matching up. She was getting really defensive when the Fitzpatrick was asking her questions. She would say things like she had a wound care appt on Monday but then say she did not have one but had to schedule one.  Crystal Fitzpatrick feels there was just something off about the way Crystal Fitzpatrick was acting. Crystal Fitzpatrick the care taker was not there.   Crystal Fitzpatrick is not sure why they are in if the prosthetic is not new.

## 2012-04-26 NOTE — Telephone Encounter (Signed)
They are in to eval and treat lower extremity strength, safety within the home, mobility, prosthetic use (she has not done much training with it at all), etc. If they are concerned about her mental status they should have the home health nurse come out and do an eval.  i felt that she was perfectly appropriate when she was here last week.

## 2012-04-27 NOTE — Telephone Encounter (Signed)
Left VM on personally identified VM for [facility] relaying [name] reply.

## 2012-04-28 ENCOUNTER — Telehealth: Payer: Self-pay | Admitting: *Deleted

## 2012-04-28 DIAGNOSIS — I509 Heart failure, unspecified: Secondary | ICD-10-CM | POA: Diagnosis not present

## 2012-04-28 DIAGNOSIS — IMO0001 Reserved for inherently not codable concepts without codable children: Secondary | ICD-10-CM | POA: Diagnosis not present

## 2012-04-28 DIAGNOSIS — M47817 Spondylosis without myelopathy or radiculopathy, lumbosacral region: Secondary | ICD-10-CM | POA: Diagnosis not present

## 2012-04-28 DIAGNOSIS — E119 Type 2 diabetes mellitus without complications: Secondary | ICD-10-CM | POA: Diagnosis not present

## 2012-04-28 DIAGNOSIS — S88119A Complete traumatic amputation at level between knee and ankle, unspecified lower leg, initial encounter: Secondary | ICD-10-CM | POA: Diagnosis not present

## 2012-04-28 DIAGNOSIS — N390 Urinary tract infection, site not specified: Secondary | ICD-10-CM | POA: Diagnosis not present

## 2012-04-28 NOTE — Telephone Encounter (Signed)
Received call from Deanna, RN with [facility]. She states the patient is acting more confused. Would like an order for Urinalysis with [facility]. Advised ok to go ahead with order.

## 2012-04-29 ENCOUNTER — Telehealth: Payer: Self-pay

## 2012-04-29 NOTE — Telephone Encounter (Signed)
Called and spoke with [place] with [place]. Advised her she would need to contact PCP for further treatment of confusion and wound care.

## 2012-04-29 NOTE — Telephone Encounter (Signed)
Joann with [facility] called to let us know patient is very tired and has a wound on her upper thigh that is very painful.  Please call.

## 2012-04-30 ENCOUNTER — Other Ambulatory Visit: Payer: Self-pay | Admitting: Internal Medicine

## 2012-05-02 ENCOUNTER — Other Ambulatory Visit: Payer: Self-pay | Admitting: *Deleted

## 2012-05-02 ENCOUNTER — Telehealth: Payer: Self-pay

## 2012-05-02 MED ORDER — TOPIRAMATE 100 MG PO TABS: 300.0000 mg | ORAL_TABLET | Freq: Every day | ORAL | Status: DC

## 2012-05-02 MED ORDER — CEPHALEXIN 500 MG PO CAPS: 500.0000 mg | ORAL_CAPSULE | Freq: Four times a day (QID) | ORAL | Status: DC

## 2012-05-02 NOTE — Telephone Encounter (Signed)
HHRN would like a verbal to take care of the patients wound (Left upper posterior thigh).  She has not been able to go to wound care for 3 weeks due to transportation.  HHRN states the patient has been exhausted and they did check her urine and was negative.  HHRN would like a verbal to care for her wound 2 to 3 times per week until she can get transportation  And schedule with Dr. Jonny Ruiz.  HHRN is concerned the wound may be infected as is warm, red and painful.  Please advise

## 2012-05-02 NOTE — Telephone Encounter (Signed)
Done hardcopy to robin  

## 2012-05-02 NOTE — Telephone Encounter (Signed)
Ok for [facility] verbal  Also ok for cephalexin course (pt has mult other med allergies)ne

## 2012-05-02 NOTE — Telephone Encounter (Signed)
Faxed hardcopy to pharmacy. 

## 2012-05-03 DIAGNOSIS — M47817 Spondylosis without myelopathy or radiculopathy, lumbosacral region: Secondary | ICD-10-CM | POA: Diagnosis not present

## 2012-05-03 DIAGNOSIS — I509 Heart failure, unspecified: Secondary | ICD-10-CM | POA: Diagnosis not present

## 2012-05-03 DIAGNOSIS — IMO0001 Reserved for inherently not codable concepts without codable children: Secondary | ICD-10-CM | POA: Diagnosis not present

## 2012-05-03 DIAGNOSIS — E119 Type 2 diabetes mellitus without complications: Secondary | ICD-10-CM | POA: Diagnosis not present

## 2012-05-03 DIAGNOSIS — S88119A Complete traumatic amputation at level between knee and ankle, unspecified lower leg, initial encounter: Secondary | ICD-10-CM | POA: Diagnosis not present

## 2012-05-03 NOTE — Telephone Encounter (Signed)
HHRN informed 

## 2012-05-06 DIAGNOSIS — K219 Gastro-esophageal reflux disease without esophagitis: Secondary | ICD-10-CM | POA: Diagnosis present

## 2012-05-06 DIAGNOSIS — S88119A Complete traumatic amputation at level between knee and ankle, unspecified lower leg, initial encounter: Secondary | ICD-10-CM | POA: Diagnosis not present

## 2012-05-06 DIAGNOSIS — W06XXXA Fall from bed, initial encounter: Secondary | ICD-10-CM | POA: Diagnosis not present

## 2012-05-06 DIAGNOSIS — S99929A Unspecified injury of unspecified foot, initial encounter: Secondary | ICD-10-CM | POA: Diagnosis not present

## 2012-05-06 DIAGNOSIS — Z23 Encounter for immunization: Secondary | ICD-10-CM | POA: Diagnosis not present

## 2012-05-06 DIAGNOSIS — N189 Chronic kidney disease, unspecified: Secondary | ICD-10-CM | POA: Diagnosis present

## 2012-05-06 DIAGNOSIS — R0609 Other forms of dyspnea: Secondary | ICD-10-CM | POA: Diagnosis not present

## 2012-05-06 DIAGNOSIS — N171 Acute kidney failure with acute cortical necrosis: Secondary | ICD-10-CM | POA: Diagnosis not present

## 2012-05-06 DIAGNOSIS — D638 Anemia in other chronic diseases classified elsewhere: Secondary | ICD-10-CM | POA: Diagnosis present

## 2012-05-06 DIAGNOSIS — R269 Unspecified abnormalities of gait and mobility: Secondary | ICD-10-CM | POA: Diagnosis present

## 2012-05-06 DIAGNOSIS — N390 Urinary tract infection, site not specified: Secondary | ICD-10-CM | POA: Diagnosis not present

## 2012-05-06 DIAGNOSIS — R0602 Shortness of breath: Secondary | ICD-10-CM | POA: Diagnosis not present

## 2012-05-06 DIAGNOSIS — E871 Hypo-osmolality and hyponatremia: Secondary | ICD-10-CM | POA: Diagnosis not present

## 2012-05-06 DIAGNOSIS — R6889 Other general symptoms and signs: Secondary | ICD-10-CM | POA: Diagnosis not present

## 2012-05-06 DIAGNOSIS — D631 Anemia in chronic kidney disease: Secondary | ICD-10-CM | POA: Diagnosis not present

## 2012-05-06 DIAGNOSIS — R509 Fever, unspecified: Secondary | ICD-10-CM | POA: Diagnosis not present

## 2012-05-06 DIAGNOSIS — J189 Pneumonia, unspecified organism: Secondary | ICD-10-CM | POA: Diagnosis not present

## 2012-05-06 DIAGNOSIS — M549 Dorsalgia, unspecified: Secondary | ICD-10-CM | POA: Diagnosis present

## 2012-05-06 DIAGNOSIS — R918 Other nonspecific abnormal finding of lung field: Secondary | ICD-10-CM | POA: Diagnosis not present

## 2012-05-06 DIAGNOSIS — R0902 Hypoxemia: Secondary | ICD-10-CM | POA: Diagnosis not present

## 2012-05-06 DIAGNOSIS — R079 Chest pain, unspecified: Secondary | ICD-10-CM | POA: Diagnosis not present

## 2012-05-06 DIAGNOSIS — S99919A Unspecified injury of unspecified ankle, initial encounter: Secondary | ICD-10-CM | POA: Diagnosis not present

## 2012-05-06 DIAGNOSIS — S78119A Complete traumatic amputation at level between unspecified hip and knee, initial encounter: Secondary | ICD-10-CM | POA: Diagnosis not present

## 2012-05-06 DIAGNOSIS — M25569 Pain in unspecified knee: Secondary | ICD-10-CM | POA: Diagnosis not present

## 2012-05-06 DIAGNOSIS — IMO0001 Reserved for inherently not codable concepts without codable children: Secondary | ICD-10-CM | POA: Diagnosis not present

## 2012-05-06 DIAGNOSIS — I890 Lymphedema, not elsewhere classified: Secondary | ICD-10-CM | POA: Diagnosis present

## 2012-05-06 DIAGNOSIS — M47817 Spondylosis without myelopathy or radiculopathy, lumbosacral region: Secondary | ICD-10-CM | POA: Diagnosis not present

## 2012-05-06 DIAGNOSIS — I498 Other specified cardiac arrhythmias: Secondary | ICD-10-CM | POA: Diagnosis not present

## 2012-05-06 DIAGNOSIS — I509 Heart failure, unspecified: Secondary | ICD-10-CM | POA: Diagnosis not present

## 2012-05-06 DIAGNOSIS — N039 Chronic nephritic syndrome with unspecified morphologic changes: Secondary | ICD-10-CM | POA: Diagnosis not present

## 2012-05-06 DIAGNOSIS — R0989 Other specified symptoms and signs involving the circulatory and respiratory systems: Secondary | ICD-10-CM | POA: Diagnosis not present

## 2012-05-06 DIAGNOSIS — S8990XA Unspecified injury of unspecified lower leg, initial encounter: Secondary | ICD-10-CM | POA: Diagnosis not present

## 2012-05-06 DIAGNOSIS — E119 Type 2 diabetes mellitus without complications: Secondary | ICD-10-CM | POA: Diagnosis present

## 2012-05-06 DIAGNOSIS — S82009A Unspecified fracture of unspecified patella, initial encounter for closed fracture: Secondary | ICD-10-CM | POA: Diagnosis not present

## 2012-05-06 DIAGNOSIS — J158 Pneumonia due to other specified bacteria: Secondary | ICD-10-CM | POA: Diagnosis not present

## 2012-05-06 DIAGNOSIS — N179 Acute kidney failure, unspecified: Secondary | ICD-10-CM | POA: Diagnosis not present

## 2012-05-06 DIAGNOSIS — M6281 Muscle weakness (generalized): Secondary | ICD-10-CM | POA: Diagnosis present

## 2012-05-09 DIAGNOSIS — I509 Heart failure, unspecified: Secondary | ICD-10-CM | POA: Diagnosis not present

## 2012-05-09 DIAGNOSIS — E119 Type 2 diabetes mellitus without complications: Secondary | ICD-10-CM | POA: Diagnosis not present

## 2012-05-09 DIAGNOSIS — M47817 Spondylosis without myelopathy or radiculopathy, lumbosacral region: Secondary | ICD-10-CM | POA: Diagnosis not present

## 2012-05-09 DIAGNOSIS — IMO0001 Reserved for inherently not codable concepts without codable children: Secondary | ICD-10-CM | POA: Diagnosis not present

## 2012-05-09 DIAGNOSIS — S88119A Complete traumatic amputation at level between knee and ankle, unspecified lower leg, initial encounter: Secondary | ICD-10-CM | POA: Diagnosis not present

## 2012-05-10 DIAGNOSIS — IMO0001 Reserved for inherently not codable concepts without codable children: Secondary | ICD-10-CM | POA: Diagnosis not present

## 2012-05-10 DIAGNOSIS — E119 Type 2 diabetes mellitus without complications: Secondary | ICD-10-CM | POA: Diagnosis not present

## 2012-05-10 DIAGNOSIS — M47817 Spondylosis without myelopathy or radiculopathy, lumbosacral region: Secondary | ICD-10-CM | POA: Diagnosis not present

## 2012-05-10 DIAGNOSIS — I509 Heart failure, unspecified: Secondary | ICD-10-CM | POA: Diagnosis not present

## 2012-05-10 DIAGNOSIS — S88119A Complete traumatic amputation at level between knee and ankle, unspecified lower leg, initial encounter: Secondary | ICD-10-CM | POA: Diagnosis not present

## 2012-05-17 DIAGNOSIS — I509 Heart failure, unspecified: Secondary | ICD-10-CM | POA: Diagnosis not present

## 2012-05-17 DIAGNOSIS — E119 Type 2 diabetes mellitus without complications: Secondary | ICD-10-CM | POA: Diagnosis not present

## 2012-05-17 DIAGNOSIS — S88119A Complete traumatic amputation at level between knee and ankle, unspecified lower leg, initial encounter: Secondary | ICD-10-CM | POA: Diagnosis not present

## 2012-05-17 DIAGNOSIS — M47817 Spondylosis without myelopathy or radiculopathy, lumbosacral region: Secondary | ICD-10-CM | POA: Diagnosis not present

## 2012-05-17 DIAGNOSIS — IMO0001 Reserved for inherently not codable concepts without codable children: Secondary | ICD-10-CM | POA: Diagnosis not present

## 2012-05-18 DIAGNOSIS — I509 Heart failure, unspecified: Secondary | ICD-10-CM | POA: Diagnosis not present

## 2012-05-18 DIAGNOSIS — M47817 Spondylosis without myelopathy or radiculopathy, lumbosacral region: Secondary | ICD-10-CM | POA: Diagnosis not present

## 2012-05-18 DIAGNOSIS — S88119A Complete traumatic amputation at level between knee and ankle, unspecified lower leg, initial encounter: Secondary | ICD-10-CM | POA: Diagnosis not present

## 2012-05-18 DIAGNOSIS — E119 Type 2 diabetes mellitus without complications: Secondary | ICD-10-CM | POA: Diagnosis not present

## 2012-05-18 DIAGNOSIS — IMO0001 Reserved for inherently not codable concepts without codable children: Secondary | ICD-10-CM | POA: Diagnosis not present

## 2012-05-19 ENCOUNTER — Encounter
Payer: Medicare Other | Attending: Physical Medicine and Rehabilitation | Admitting: Physical Medicine and Rehabilitation

## 2012-05-19 DIAGNOSIS — R296 Repeated falls: Secondary | ICD-10-CM | POA: Insufficient documentation

## 2012-05-19 DIAGNOSIS — L02419 Cutaneous abscess of limb, unspecified: Secondary | ICD-10-CM | POA: Insufficient documentation

## 2012-05-19 DIAGNOSIS — G609 Hereditary and idiopathic neuropathy, unspecified: Secondary | ICD-10-CM | POA: Insufficient documentation

## 2012-05-19 DIAGNOSIS — L03119 Cellulitis of unspecified part of limb: Secondary | ICD-10-CM | POA: Insufficient documentation

## 2012-05-19 DIAGNOSIS — M1710 Unilateral primary osteoarthritis, unspecified knee: Secondary | ICD-10-CM | POA: Insufficient documentation

## 2012-05-19 DIAGNOSIS — S88119A Complete traumatic amputation at level between knee and ankle, unspecified lower leg, initial encounter: Secondary | ICD-10-CM | POA: Insufficient documentation

## 2012-05-19 DIAGNOSIS — S82009A Unspecified fracture of unspecified patella, initial encounter for closed fracture: Secondary | ICD-10-CM | POA: Insufficient documentation

## 2012-05-19 DIAGNOSIS — M069 Rheumatoid arthritis, unspecified: Secondary | ICD-10-CM | POA: Insufficient documentation

## 2012-05-20 ENCOUNTER — Encounter (HOSPITAL_BASED_OUTPATIENT_CLINIC_OR_DEPARTMENT_OTHER): Payer: Medicare Other | Admitting: Physical Medicine & Rehabilitation

## 2012-05-20 ENCOUNTER — Encounter: Payer: Self-pay | Admitting: Physical Medicine & Rehabilitation

## 2012-05-20 VITALS — BP 123/72 | HR 100 | Resp 14 | Ht 70.0 in | Wt 280.0 lb

## 2012-05-20 DIAGNOSIS — L02419 Cutaneous abscess of limb, unspecified: Secondary | ICD-10-CM | POA: Diagnosis not present

## 2012-05-20 DIAGNOSIS — M1712 Unilateral primary osteoarthritis, left knee: Secondary | ICD-10-CM

## 2012-05-20 DIAGNOSIS — E119 Type 2 diabetes mellitus without complications: Secondary | ICD-10-CM

## 2012-05-20 DIAGNOSIS — M179 Osteoarthritis of knee, unspecified: Secondary | ICD-10-CM

## 2012-05-20 DIAGNOSIS — M47817 Spondylosis without myelopathy or radiculopathy, lumbosacral region: Secondary | ICD-10-CM

## 2012-05-20 DIAGNOSIS — L03119 Cellulitis of unspecified part of limb: Secondary | ICD-10-CM

## 2012-05-20 DIAGNOSIS — M1710 Unilateral primary osteoarthritis, unspecified knee: Secondary | ICD-10-CM | POA: Diagnosis not present

## 2012-05-20 DIAGNOSIS — S88119A Complete traumatic amputation at level between knee and ankle, unspecified lower leg, initial encounter: Secondary | ICD-10-CM | POA: Diagnosis not present

## 2012-05-20 DIAGNOSIS — Z5181 Encounter for therapeutic drug level monitoring: Secondary | ICD-10-CM

## 2012-05-20 DIAGNOSIS — G547 Phantom limb syndrome without pain: Secondary | ICD-10-CM | POA: Diagnosis not present

## 2012-05-20 DIAGNOSIS — S82009A Unspecified fracture of unspecified patella, initial encounter for closed fracture: Secondary | ICD-10-CM | POA: Diagnosis not present

## 2012-05-20 DIAGNOSIS — G546 Phantom limb syndrome with pain: Secondary | ICD-10-CM

## 2012-05-20 DIAGNOSIS — S81802A Unspecified open wound, left lower leg, initial encounter: Secondary | ICD-10-CM

## 2012-05-20 DIAGNOSIS — M069 Rheumatoid arthritis, unspecified: Secondary | ICD-10-CM | POA: Diagnosis not present

## 2012-05-20 DIAGNOSIS — M47816 Spondylosis without myelopathy or radiculopathy, lumbar region: Secondary | ICD-10-CM

## 2012-05-20 DIAGNOSIS — L03116 Cellulitis of left lower limb: Secondary | ICD-10-CM

## 2012-05-20 DIAGNOSIS — S91009A Unspecified open wound, unspecified ankle, initial encounter: Secondary | ICD-10-CM

## 2012-05-20 DIAGNOSIS — IMO0002 Reserved for concepts with insufficient information to code with codable children: Secondary | ICD-10-CM | POA: Diagnosis not present

## 2012-05-20 DIAGNOSIS — G609 Hereditary and idiopathic neuropathy, unspecified: Secondary | ICD-10-CM | POA: Diagnosis not present

## 2012-05-20 MED ORDER — OXYCODONE-ACETAMINOPHEN 10-325 MG PO TABS: 1.0000 | ORAL_TABLET | ORAL | Status: DC | PRN

## 2012-05-20 MED ORDER — FENTANYL 75 MCG/HR TD PT72: 1.0000 | MEDICATED_PATCH | TRANSDERMAL | Status: DC

## 2012-05-20 NOTE — Progress Notes (Signed)
Subjective:    Patient ID: Crystal Fitzpatrick, female    DOB: 09-11-1945, 66 y.o.   MRN: [id number]  HPI  Crystal Fitzpatrick is back regarding her multiple pain complaints. About 2 weeks ago she fell and broke her patella. A KI was rx'ed but shes not wearing it consistently. She sees the orthopedist in [place] next week. She wears the KI generally around the house and goes without if she leaves the home.  Crystal Fitzpatrick tells me that she has looked at ILF's recently.   She maintains on her current medications for pain.      Pain Inventory Average Pain 5 Pain Right Now 6 My pain is unsure  In the last 24 hours, has pain interfered with the following? General activity 8 Relation with others 6 Enjoyment of life 6 What TIME of day is your pain at its worst? morning and evening Sleep (in general) Fair  Pain is worse with: walking, bending, sitting, standing, unsure and some activites Pain improves with: rest, heat/ice, therapy/exercise, medication, TENS and injections Relief from Meds: 10  Mobility walk without assistance walk with assistance use a cane use a walker ability to climb steps?  no do you drive?  no use a wheelchair needs help with transfers Do you have any goals in this area?  yes  Function disabled: date disabled  I need assistance with the following:  meal prep, household duties and shopping Do you have any goals in this area?  yes  Neuro/Psych No problems in this area  Prior Studies Any changes since last visit?  no  Physicians involved in your care Any changes since last visit?  no   Family History  Problem Relation Age of Onset  . Breast cancer Sister   . Diabetes Other   . Stroke Other     Grandparents   History   Social History  . Marital Status: Divorced    Spouse Name: N/A    Number of Children: 1  . Years of Education: N/A   Occupational History  . retired Marketing executive    Social History Main Topics  . Smoking status: Former Games developer  .  Smokeless tobacco: Never Used  . Alcohol Use: No  . Drug Use: No  . Sexually Active: None   Other Topics Concern  . None   Social History Narrative  . None   Past Surgical History  Procedure Date  . Amputation 02/02/2008    below right knee  . S/p egd 2007    with Botox for? Achalasia  . Tonsillectomy   . S/p breast biopsy 1992  . 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 Tubal ligation   . Nasal septum surgery   . Foot surgury 1980 and 1981   Past Medical History  Diagnosis Date  . DIABETES MELLITUS, TYPE II 07/17/2008  . B12 DEFICIENCY 07/17/2008  . Acute gouty arthropathy 12/11/2008  . HYPONATREMIA 03/20/2010  . HYPERKALEMIA 10/31/2009  . ANEMIA-NOS 07/17/2008  . ANXIETY 07/17/2008  . Chronic pain syndrome 02/14/2010  . Trigeminal neuralgia 02/14/2010  . HEARING LOSS, RIGHT EAR 06/04/2009  . HYPERTENSION 07/17/2008  . CHF 01/28/2009  . Pneumonia, organism unspecified 02/14/2010  . GERD 07/17/2008  . IBS 06/04/2009  . UNSPECIFIED HEPATITIS 01/28/2009  . RENAL INSUFFICIENCY 10/31/2009  . MENOPAUSAL DISORDER 12/03/2009  . Cellulitis and abscess of leg, except foot 11/15/2008  . SKIN ULCER, CHRONIC 02/08/2009  . Rheumatoid arthritis 07/17/2008  . SKIN LESION 06/04/2009  . ARTHRITIS  01/28/2009  . FOOT PAIN 07/17/2008  . HYPERSOMNIA 02/14/2010  . Altered mental status 02/07/2010  . PERIPHERAL EDEMA 08/30/2008  . Wheezing 12/03/2009  . ABDOMINAL DISTENSION 10/11/2008  . Dysuria 11/11/2009  . Hypoxemia 03/20/2010  . ANAPHYLACTIC SHOCK 05/15/2009  . NEUROPATHY, HX OF 01/28/2009  . COLONIC POLYPS, HX OF 07/17/2008  . AMPUTATION, BELOW KNEE, RIGHT, HX OF 01/28/2009  . Hyperlipemia 09/24/2010  . Hyperlipidemia 09/24/2010  . Seizure 10/26/2010  . Pneumonia, aspiration 12/01/2011    Episode July 2013, tx per [facility]   BP 123/72  Pulse 100  Resp 14  Ht 5\' 10"  (1.778 m)  Wt 280 lb (127.007 kg)  BMI 40.18 kg/m2  SpO2 92%    Review of Systems  Musculoskeletal: Positive for back  pain and gait problem.  All other systems reviewed and are negative.       Objective:   Physical Exam Constitutional: She appears well-developed and well-nourished.  HENT:  Head: Normocephalic and atraumatic.  Nose: Nose normal.  Mouth/Throat: Oropharynx is clear and moist.  Eyes: Conjunctivae are normal.  Neck: Normal range of motion. Neck supple.  Cardiovascular: Normal rate and regular rhythm.  Pulmonary/Chest: No respiratory distress. She has no wheezes.  Musculoskeletal:  Edema 2+ RLE, 2++LLE. Chronic stasis changes are noted on the left leg. Leg is dressed. Weight is stable but she remains morbidly obese. Left knee is slightly swollen, patella is tender to touch. No bruising is seen. She is not wearing her KI.  Neurological: A sensory deficit (left foot) is present.  Psychiatric: She has a normal mood and affect. Her behavior is normal. Judgment and thought content normal.  Assessment & Plan:   ASSESSMENT:  1. Right below-knee amputation, history of phantom limb pain.  2. Peripheral neuropathy.  3. History of rheumatoid arthritis.  4. Osteoarthritis of left knee-improved after synvisc  5. Chronic cellulitis, left leg with associated edema.  6. Left patella fx (per pt) after fall at home 2 weeks ago  PLAN:  1. Continue compression stocking to right leg. I encouraged double  wrapping the distal portions to encourage increased edema control.  -lymphedema program consult was made  2. Continue wound care per the [facility] wound care clinic. The compression wrap is helping her.  3. We reviewed the fact that she should be wearing her KI at all times! I don't know what the extent of this patella fx, but I think she should be wearing this until she follows up with ortho.  4. Refilled her pain medications today including Percocet 10/325, #130  and fentanyl 75 mcg #10.    -still could consider pain psychology consult with Dr. Gennie Alma  -goal will be to taper meds  6.She will follow  up with my PA in one month.

## 2012-05-20 NOTE — Patient Instructions (Signed)
PLEASE WEAR YOUR KNEE IMMOBILIZER AS MUCH AS POSSIBLE, PREFERABLY AT ALL TIMES!!!!!!

## 2012-05-24 ENCOUNTER — Ambulatory Visit: Payer: Medicare Other | Admitting: Internal Medicine

## 2012-05-24 DIAGNOSIS — S82009A Unspecified fracture of unspecified patella, initial encounter for closed fracture: Secondary | ICD-10-CM | POA: Diagnosis not present

## 2012-05-24 DIAGNOSIS — M1710 Unilateral primary osteoarthritis, unspecified knee: Secondary | ICD-10-CM | POA: Diagnosis not present

## 2012-05-26 DIAGNOSIS — S88119A Complete traumatic amputation at level between knee and ankle, unspecified lower leg, initial encounter: Secondary | ICD-10-CM | POA: Diagnosis not present

## 2012-05-26 DIAGNOSIS — E119 Type 2 diabetes mellitus without complications: Secondary | ICD-10-CM | POA: Diagnosis not present

## 2012-05-26 DIAGNOSIS — IMO0001 Reserved for inherently not codable concepts without codable children: Secondary | ICD-10-CM | POA: Diagnosis not present

## 2012-05-26 DIAGNOSIS — M47817 Spondylosis without myelopathy or radiculopathy, lumbosacral region: Secondary | ICD-10-CM | POA: Diagnosis not present

## 2012-05-26 DIAGNOSIS — I509 Heart failure, unspecified: Secondary | ICD-10-CM | POA: Diagnosis not present

## 2012-05-31 ENCOUNTER — Ambulatory Visit: Payer: Medicare Other | Admitting: Internal Medicine

## 2012-05-31 DIAGNOSIS — I509 Heart failure, unspecified: Secondary | ICD-10-CM | POA: Diagnosis not present

## 2012-05-31 DIAGNOSIS — E119 Type 2 diabetes mellitus without complications: Secondary | ICD-10-CM | POA: Diagnosis not present

## 2012-05-31 DIAGNOSIS — S88119A Complete traumatic amputation at level between knee and ankle, unspecified lower leg, initial encounter: Secondary | ICD-10-CM | POA: Diagnosis not present

## 2012-05-31 DIAGNOSIS — M47817 Spondylosis without myelopathy or radiculopathy, lumbosacral region: Secondary | ICD-10-CM | POA: Diagnosis not present

## 2012-05-31 DIAGNOSIS — IMO0001 Reserved for inherently not codable concepts without codable children: Secondary | ICD-10-CM | POA: Diagnosis not present

## 2012-06-01 DIAGNOSIS — IMO0001 Reserved for inherently not codable concepts without codable children: Secondary | ICD-10-CM | POA: Diagnosis not present

## 2012-06-01 DIAGNOSIS — S88119A Complete traumatic amputation at level between knee and ankle, unspecified lower leg, initial encounter: Secondary | ICD-10-CM | POA: Diagnosis not present

## 2012-06-01 DIAGNOSIS — I509 Heart failure, unspecified: Secondary | ICD-10-CM | POA: Diagnosis not present

## 2012-06-01 DIAGNOSIS — M47817 Spondylosis without myelopathy or radiculopathy, lumbosacral region: Secondary | ICD-10-CM | POA: Diagnosis not present

## 2012-06-01 DIAGNOSIS — E119 Type 2 diabetes mellitus without complications: Secondary | ICD-10-CM | POA: Diagnosis not present

## 2012-06-03 DIAGNOSIS — M47817 Spondylosis without myelopathy or radiculopathy, lumbosacral region: Secondary | ICD-10-CM | POA: Diagnosis not present

## 2012-06-03 DIAGNOSIS — S88119A Complete traumatic amputation at level between knee and ankle, unspecified lower leg, initial encounter: Secondary | ICD-10-CM | POA: Diagnosis not present

## 2012-06-03 DIAGNOSIS — I509 Heart failure, unspecified: Secondary | ICD-10-CM | POA: Diagnosis not present

## 2012-06-03 DIAGNOSIS — E119 Type 2 diabetes mellitus without complications: Secondary | ICD-10-CM | POA: Diagnosis not present

## 2012-06-03 DIAGNOSIS — IMO0001 Reserved for inherently not codable concepts without codable children: Secondary | ICD-10-CM | POA: Diagnosis not present

## 2012-06-07 ENCOUNTER — Telehealth: Payer: Self-pay

## 2012-06-07 DIAGNOSIS — E119 Type 2 diabetes mellitus without complications: Secondary | ICD-10-CM | POA: Diagnosis not present

## 2012-06-07 DIAGNOSIS — M47817 Spondylosis without myelopathy or radiculopathy, lumbosacral region: Secondary | ICD-10-CM | POA: Diagnosis not present

## 2012-06-07 DIAGNOSIS — I509 Heart failure, unspecified: Secondary | ICD-10-CM | POA: Diagnosis not present

## 2012-06-07 DIAGNOSIS — IMO0001 Reserved for inherently not codable concepts without codable children: Secondary | ICD-10-CM | POA: Diagnosis not present

## 2012-06-07 DIAGNOSIS — S88119A Complete traumatic amputation at level between knee and ankle, unspecified lower leg, initial encounter: Secondary | ICD-10-CM | POA: Diagnosis not present

## 2012-06-07 NOTE — Telephone Encounter (Signed)
Ok for verbal 

## 2012-06-07 NOTE — Telephone Encounter (Signed)
HHRN needs verbal ok to see the patient two times weekly for wound care.  HHRN stated backside wound has not had enough attention.  They need to clean the wound, apply skin prep, hydrogel gauze and tape two times weekly and prn if needed.  Please advise. Call back number is [phone]

## 2012-06-07 NOTE — Telephone Encounter (Signed)
HHRN informed 

## 2012-06-08 DIAGNOSIS — E119 Type 2 diabetes mellitus without complications: Secondary | ICD-10-CM | POA: Diagnosis not present

## 2012-06-08 DIAGNOSIS — M47817 Spondylosis without myelopathy or radiculopathy, lumbosacral region: Secondary | ICD-10-CM | POA: Diagnosis not present

## 2012-06-08 DIAGNOSIS — S88119A Complete traumatic amputation at level between knee and ankle, unspecified lower leg, initial encounter: Secondary | ICD-10-CM | POA: Diagnosis not present

## 2012-06-08 DIAGNOSIS — IMO0001 Reserved for inherently not codable concepts without codable children: Secondary | ICD-10-CM | POA: Diagnosis not present

## 2012-06-08 DIAGNOSIS — I509 Heart failure, unspecified: Secondary | ICD-10-CM | POA: Diagnosis not present

## 2012-06-13 ENCOUNTER — Encounter
Payer: Medicare Other | Attending: Physical Medicine and Rehabilitation | Admitting: Physical Medicine and Rehabilitation

## 2012-06-13 ENCOUNTER — Encounter: Payer: Self-pay | Admitting: Physical Medicine and Rehabilitation

## 2012-06-13 VITALS — BP 130/62 | HR 84 | Resp 14 | Ht 70.0 in | Wt 280.0 lb

## 2012-06-13 DIAGNOSIS — S82009A Unspecified fracture of unspecified patella, initial encounter for closed fracture: Secondary | ICD-10-CM | POA: Diagnosis not present

## 2012-06-13 DIAGNOSIS — S81802A Unspecified open wound, left lower leg, initial encounter: Secondary | ICD-10-CM

## 2012-06-13 DIAGNOSIS — G609 Hereditary and idiopathic neuropathy, unspecified: Secondary | ICD-10-CM

## 2012-06-13 DIAGNOSIS — M47817 Spondylosis without myelopathy or radiculopathy, lumbosacral region: Secondary | ICD-10-CM

## 2012-06-13 DIAGNOSIS — M1710 Unilateral primary osteoarthritis, unspecified knee: Secondary | ICD-10-CM | POA: Insufficient documentation

## 2012-06-13 DIAGNOSIS — M47816 Spondylosis without myelopathy or radiculopathy, lumbar region: Secondary | ICD-10-CM

## 2012-06-13 DIAGNOSIS — M1712 Unilateral primary osteoarthritis, left knee: Secondary | ICD-10-CM

## 2012-06-13 DIAGNOSIS — G547 Phantom limb syndrome without pain: Secondary | ICD-10-CM

## 2012-06-13 DIAGNOSIS — M069 Rheumatoid arthritis, unspecified: Secondary | ICD-10-CM

## 2012-06-13 DIAGNOSIS — S88119A Complete traumatic amputation at level between knee and ankle, unspecified lower leg, initial encounter: Secondary | ICD-10-CM | POA: Diagnosis not present

## 2012-06-13 DIAGNOSIS — Z89519 Acquired absence of unspecified leg below knee: Secondary | ICD-10-CM

## 2012-06-13 DIAGNOSIS — L03116 Cellulitis of left lower limb: Secondary | ICD-10-CM

## 2012-06-13 DIAGNOSIS — L03119 Cellulitis of unspecified part of limb: Secondary | ICD-10-CM | POA: Insufficient documentation

## 2012-06-13 DIAGNOSIS — L02419 Cutaneous abscess of limb, unspecified: Secondary | ICD-10-CM | POA: Diagnosis not present

## 2012-06-13 DIAGNOSIS — W19XXXA Unspecified fall, initial encounter: Secondary | ICD-10-CM | POA: Insufficient documentation

## 2012-06-13 DIAGNOSIS — G546 Phantom limb syndrome with pain: Secondary | ICD-10-CM

## 2012-06-13 DIAGNOSIS — S81809A Unspecified open wound, unspecified lower leg, initial encounter: Secondary | ICD-10-CM

## 2012-06-13 DIAGNOSIS — E119 Type 2 diabetes mellitus without complications: Secondary | ICD-10-CM

## 2012-06-13 MED ORDER — FENTANYL 75 MCG/HR TD PT72: 1.0000 | MEDICATED_PATCH | TRANSDERMAL | Status: DC

## 2012-06-13 MED ORDER — DICLOFENAC SODIUM 1 % TD GEL: 2.0000 g | Freq: Four times a day (QID) | TRANSDERMAL | Status: DC

## 2012-06-13 MED ORDER — OXYCODONE-ACETAMINOPHEN 10-325 MG PO TABS: 1.0000 | ORAL_TABLET | ORAL | Status: DC | PRN

## 2012-06-13 NOTE — Patient Instructions (Signed)
Continue with the exercises you learned from PT. Continue with following up with wound care at [facility].

## 2012-06-13 NOTE — Progress Notes (Signed)
Subjective:    Patient ID: Crystal Fitzpatrick, female    DOB: 1946/03/20, 67 y.o.   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 After lymph edema in 2010 she was not able to wear a prosthetic leg anymore. Since then she was in a wheelchair.She has restarted with PT and wearing her prosthetic leg, until she fell about 5 weeks ago. Left patella fx (per pt) after fall at home 5 weeks ago, she saw Dr. Marva Panda, orthopedic surgeon in [place], he diagnosed her with a fissure in the left patella, and with some cysts, per patient, patient will call his office to send [place] the images and his notes.  The patient is doing exercises she learned from PT at home now, she is not able to walk at this point, because of her left knee pain. Pain Inventory Average Pain 8 Pain Right Now 7 My pain is sharp, burning and aching  In the last 24 hours, has pain interfered with the following? General activity 4 Relation with others 0 Enjoyment of life 4 What TIME of day is your pain at its worst? morning and evening Sleep (in general) Poor  Pain is worse with: sitting Pain improves with: medication Relief from Meds: 7  Mobility ability to climb steps?  no do you drive?  no use a wheelchair transfers alone Do you have any goals in this area?  no  Function disabled: date disabled 09 I need assistance with the following:  dressing, meal prep, household duties and shopping  Neuro/Psych weakness tremor trouble walking spasms confusion anxiety  Prior Studies x-rays  Physicians involved in your care Any changes since last visit?  no   Family History  Problem Relation Age of Onset  . Breast cancer Sister   . Diabetes Other   . Stroke Other     Grandparents   History   Social History  . Marital Status: Divorced    Spouse Name: N/A    Number of Children: 1  . Years of Education: N/A   Occupational History  . retired Marketing executive      Social History Main Topics  . Smoking status: Former Games developer  . Smokeless tobacco: Never Used  . Alcohol Use: No  . Drug Use: No  . Sexually Active: None   Other Topics Concern  . None   Social History Narrative  . None   Past Surgical History  Procedure Date  . Amputation 02/02/2008    below right knee  . S/p egd 2007    with Botox for? Achalasia  . Tonsillectomy   . S/p breast biopsy 1992  . 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 Tubal ligation   . Nasal septum surgery   . Foot surgury 1980 and 1981   Past Medical History  Diagnosis Date  . DIABETES MELLITUS, TYPE II 07/17/2008  . B12 DEFICIENCY 07/17/2008  . Acute gouty arthropathy 12/11/2008  . HYPONATREMIA 03/20/2010  . HYPERKALEMIA 10/31/2009  . ANEMIA-NOS 07/17/2008  . ANXIETY 07/17/2008  . Chronic pain syndrome 02/14/2010  . Trigeminal neuralgia 02/14/2010  . HEARING LOSS, RIGHT EAR 06/04/2009  . HYPERTENSION 07/17/2008  . CHF 01/28/2009  . Pneumonia, organism unspecified 02/14/2010  . GERD 07/17/2008  . IBS 06/04/2009  . UNSPECIFIED HEPATITIS 01/28/2009  . RENAL INSUFFICIENCY 10/31/2009  . MENOPAUSAL DISORDER 12/03/2009  . Cellulitis and abscess of leg, except foot 11/15/2008  . SKIN ULCER, CHRONIC 02/08/2009  .  Rheumatoid arthritis 07/17/2008  . SKIN LESION 06/04/2009  . ARTHRITIS 01/28/2009  . FOOT PAIN 07/17/2008  . HYPERSOMNIA 02/14/2010  . Altered mental status 02/07/2010  . PERIPHERAL EDEMA 08/30/2008  . Wheezing 12/03/2009  . ABDOMINAL DISTENSION 10/11/2008  . Dysuria 11/11/2009  . Hypoxemia 03/20/2010  . ANAPHYLACTIC SHOCK 05/15/2009  . NEUROPATHY, HX OF 01/28/2009  . COLONIC POLYPS, HX OF 07/17/2008  . AMPUTATION, BELOW KNEE, RIGHT, HX OF 01/28/2009  . Hyperlipemia 09/24/2010  . Hyperlipidemia 09/24/2010  . Seizure 10/26/2010  . Pneumonia, aspiration 12/01/2011    Episode July 2013, tx per [facility]   BP 130/62  Pulse 84  Resp 14  Ht 5\' 10"  (1.778 m)  Wt 280 lb (127.007 kg)  BMI 40.18 kg/m2  SpO2  96%     Review of Systems  Constitutional: Positive for fever and diaphoresis.  Cardiovascular: Positive for leg swelling.  Gastrointestinal: Positive for diarrhea and constipation.  Musculoskeletal: Positive for gait problem.  Neurological: Positive for weakness.  Psychiatric/Behavioral: Positive for confusion. The patient is nervous/anxious.   All other systems reviewed and are negative.       Objective:   Physical Exam Constitutional: She appears well-developed and well-nourished.  HENT:  Head: Normocephalic and atraumatic.  Nose: Nose normal.  Mouth/Throat: Oropharynx is clear and moist.  Eyes: Conjunctivae are normal.  Neck: Normal range of motion. Neck supple.  Cardiovascular: Normal rate and regular rhythm.  Pulmonary/Chest: No respiratory distress. She has no wheezes.  Musculoskeletal:  Edema  2++LLE. Chronic stasis changes are noted on the left leg. Leg is dressed. Weight is stable but she remains morbidly obese. Left knee is slightly swollen, patella is tender to touch. No bruising is seen. She is not wearing her KI. Extension deficit of left knee about 15 degrees, flexion 110 degrees Strength grossly intact, except right lower leg.  Neurological: A sensory deficit (left foot) is present.  Psychiatric: She has a normal mood and affect. Her behavior is normal. Judgment and thought content normal.         Assessment & Plan:  1. Right below-knee amputation, history of phantom limb pain.  2. Peripheral neuropathy.  3. History of rheumatoid arthritis.  4. Osteoarthritis of left knee-improved after synvisc  5. Chronic cellulitis, left leg with associated edema.  6. Left patella fx (per pt) after fall at home 5 weeks ago, she saw Dr. Marva Panda, orthopedic surgeon in [place], he diagnosed her with a fissure in the left patella, and with some cysts, per patient, patient will call his office to send [place] the images and his notes.  PLAN:  1. Continue compression stocking to right  leg. Advised patient to continue with the exercises she learned from Pt, also showed her some exercises to improve her left knee extension. 2. Continue wound care per the [facility] wound care clinic. The compression wrap is helping her.  3. We reviewed the fact that she should be wearing her KI at all times! I don't know what the extent of this patella fx, but I think she should be wearing this until she follows up with ortho. Patient states, that her orthopedic surgeon has told her not to wear her KI. 4. Refilled her pain medications today including Percocet 10/325, #130  and fentanyl 75 mcg #10.  -still could consider pain psychology consult with Dr. Gennie Alma  -goal will be to taper meds  6.She will follow up with my PA in one month.

## 2012-06-14 ENCOUNTER — Ambulatory Visit (INDEPENDENT_AMBULATORY_CARE_PROVIDER_SITE_OTHER): Payer: Medicare Other | Admitting: Internal Medicine

## 2012-06-14 ENCOUNTER — Ambulatory Visit: Payer: Medicare Other | Admitting: Internal Medicine

## 2012-06-14 ENCOUNTER — Encounter: Payer: Self-pay | Admitting: Internal Medicine

## 2012-06-14 VITALS — BP 118/76 | HR 87 | Temp 98.3°F

## 2012-06-14 DIAGNOSIS — N959 Unspecified menopausal and perimenopausal disorder: Secondary | ICD-10-CM | POA: Diagnosis not present

## 2012-06-14 DIAGNOSIS — Z23 Encounter for immunization: Secondary | ICD-10-CM

## 2012-06-14 DIAGNOSIS — E785 Hyperlipidemia, unspecified: Secondary | ICD-10-CM | POA: Diagnosis not present

## 2012-06-14 DIAGNOSIS — I10 Essential (primary) hypertension: Secondary | ICD-10-CM

## 2012-06-14 DIAGNOSIS — Z0000 Encounter for general adult medical examination without abnormal findings: Secondary | ICD-10-CM | POA: Diagnosis not present

## 2012-06-14 DIAGNOSIS — M8580 Other specified disorders of bone density and structure, unspecified site: Secondary | ICD-10-CM

## 2012-06-14 DIAGNOSIS — M949 Disorder of cartilage, unspecified: Secondary | ICD-10-CM

## 2012-06-14 DIAGNOSIS — M899 Disorder of bone, unspecified: Secondary | ICD-10-CM | POA: Diagnosis not present

## 2012-06-14 DIAGNOSIS — D649 Anemia, unspecified: Secondary | ICD-10-CM

## 2012-06-14 NOTE — Progress Notes (Signed)
Subjective:    Patient ID: Crystal Fitzpatrick, female    DOB: [birth year], 67 y.o.   MRN: [id number]  HPI Here to f/u, no overt bleeding or bruising. Pt denies chest pain, increased sob or doe, wheezing, orthopnea, PND, increased LE swelling, palpitations, dizziness or syncope.  Pt denies new neurological symptoms such as new headache, or facial or extremity weakness or numbness   Pt denies polydipsia, polyuria.  Did have lasix reduced to 20 mg qod due to renal insufficiency about dec 2013, after a fall with small left patellar fx, tx in [place] per ortho (Dr Thamas Jaegers); holding on steroid as pt has had some intolerance before with fast HR.  Gettting synvisc per Dr Hermelinda Medicus to left knee.  May need eventual left knee TKR.  Pt denies fever, wt loss, night sweats, loss of appetite, or other constitutional symptoms   Past Medical History  Diagnosis Date  . DIABETES MELLITUS, TYPE II 07/17/2008  . B12 DEFICIENCY 07/17/2008  . Acute gouty arthropathy 12/11/2008  . HYPONATREMIA 03/20/2010  . HYPERKALEMIA 10/31/2009  . ANEMIA-NOS 07/17/2008  . ANXIETY 07/17/2008  . Chronic pain syndrome 02/14/2010  . Trigeminal neuralgia 02/14/2010  . HEARING LOSS, RIGHT EAR 06/04/2009  . HYPERTENSION 07/17/2008  . CHF 01/28/2009  . Pneumonia, organism unspecified 02/14/2010  . GERD 07/17/2008  . IBS 06/04/2009  . UNSPECIFIED HEPATITIS 01/28/2009  . RENAL INSUFFICIENCY 10/31/2009  . MENOPAUSAL DISORDER 12/03/2009  . Cellulitis and abscess of leg, except foot 11/15/2008  . SKIN ULCER, CHRONIC 02/08/2009  . Rheumatoid arthritis 07/17/2008  . SKIN LESION 06/04/2009  . ARTHRITIS 01/28/2009  . FOOT PAIN 07/17/2008  . HYPERSOMNIA 02/14/2010  . Altered mental status 02/07/2010  . PERIPHERAL EDEMA 08/30/2008  . Wheezing 12/03/2009  . ABDOMINAL DISTENSION 10/11/2008  . Dysuria 11/11/2009  . Hypoxemia 03/20/2010  . ANAPHYLACTIC SHOCK 05/15/2009  . NEUROPATHY, HX OF 01/28/2009  . COLONIC POLYPS, HX OF 07/17/2008  . AMPUTATION, BELOW KNEE, RIGHT, HX OF  01/28/2009  . Hyperlipemia 09/24/2010  . Hyperlipidemia 09/24/2010  . Seizure 10/26/2010  . Pneumonia, aspiration 12/01/2011    Episode July 2013, tx per [facility]   Past Surgical History  Procedure Date  . Amputation 02/02/2008    below right knee  . S/p egd 2007    with Botox for? Achalasia  . Tonsillectomy   . S/p breast biopsy 1992  . 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 Tubal ligation   . Nasal septum surgery   . Foot surgury 1980 and 1981    reports that she has quit smoking. She has never used smokeless tobacco. She reports that she does not drink alcohol or use illicit drugs. family history includes Breast cancer in her sister; Diabetes in her other; and Stroke in her other. Allergies  Allergen Reactions  . Morphine Anaphylaxis  . Sulfonamide Derivatives Diarrhea  . Ciprofloxacin Diarrhea  . Doxycycline     Nausea, diarrhea  . Nitrofurantoin     REACTION: itch  . Prednisone     Fast heart rate  . Erythromycin Rash   Current Outpatient Prescriptions on File Prior to Visit  Medication Sig Dispense Refill  . albuterol (PROAIR HFA) 108 (90 BASE) MCG/ACT inhaler Inhale 2 puffs into the lungs every 6 (six) hours as needed.        . ALPRAZolam (XANAX) 0.25 MG tablet TAKE 1 TABLET BY MOUTH 3 TIMES DAILY    AS NEEDED  90 tablet  2  .  Ascorbic Acid (VITAMIN C) 500 MG tablet Take 500 mg by mouth daily.        [name] atorvastatin (LIPITOR) 10 MG tablet Take 10 mg by mouth daily.      [name] BENICAR 40 MG tablet TAKE 1 TABLET BY MOUTH EVERY DAY FOR    BLOOD PRESSURE  30 tablet  11  . Blood Glucose Monitoring Suppl (ONE TOUCH ULTRA 2) W/DEVICE KIT 1 Device by Does not apply route once.  1 each  0  . Blood Glucose Monitoring Suppl (ONE TOUCH ULTRA SYSTEM KIT) W/DEVICE KIT 1 kit by Does not apply route once.  1 each  0  . cephALEXin (KEFLEX) 500 MG capsule Take 1 capsule (500 mg total) by mouth 4 (four) times daily.  40 capsule  0  . Cholecalciferol (VITAMIN D) 400 UNITS  capsule Take 400 Units by mouth daily.        . cyclobenzaprine (FLEXERIL) 10 MG tablet Take 1 tablet (10 mg total) by mouth every 8 (eight) hours as needed.  90 tablet  5  . diclofenac sodium (VOLTAREN) 1 % GEL Apply 2 g topically 4 (four) times daily.  3 Tube  3  . diphenoxylate-atropine (LOMOTIL) 2.5-0.025 MG per tablet TAKE ONE TABLET TWICE DAILY AS NEEDED  60 tablet  2  . DULoxetine (CYMBALTA) 60 MG capsule Take 2 capsules (120 mg total) by mouth daily.  60 capsule  5  . EPINEPHrine (EPIPEN 2-PAK) 0.3 mg/0.3 mL DEVI Inject 0.3 mLs (0.3 mg total) into the muscle once. Use as directed  1 Device  0  . fentaNYL (DURAGESIC - DOSED MCG/HR) 75 MCG/HR Place 1 patch (75 mcg total) onto the skin every 3 (three) days. 1 patch every 3 days  10 patch  0  . furosemide (LASIX) 20 MG tablet Take 20 mg by mouth every other day.      . gabapentin (NEURONTIN) 600 MG tablet Take 1 tablet (600 mg total) by mouth 3 (three) times daily.  90 tablet  3  . lidocaine (LIDODERM) 5 % Place 1 patch onto the skin. Remove & Discard patch within 12 hours or as directed by MD       . loperamide (ANTI-DIARRHEAL) 2 MG tablet Take 2 mg by mouth 2 (two) times daily as needed.        . Multiple Vitamins-Minerals (CENTRUM SILVER ULTRA WOMENS PO) Take by mouth daily.        . Omega-3 Fatty Acids (FISH OIL) 1000 MG CAPS Take by mouth daily.        Letta Pate DELICA LANCETS MISC Use as directed to test blood sugar dx 250.00  100 each  2  . oxyCODONE-acetaminophen (PERCOCET) 10-325 MG per tablet Take 1 tablet by mouth every 4 (four) hours as needed for pain.  130 tablet  0  . pantoprazole (PROTONIX) 40 MG tablet TAKE ONE (1) TABLET BY MOUTH EVERY DAY  FOR ACID REFLUX  30 tablet  11  . promethazine (PHENERGAN) 25 MG tablet TAKE 1 TABLET BY MOUTH EVERY 6 HOURS    AS NEEDED FOR NAUSEA  60 tablet  1  . Psyllium (METAMUCIL) 30.9 % POWD Take by mouth. Take as directed       . topiramate (TOPAMAX) 100 MG tablet Take 3 tablets (300 mg total) by  mouth at bedtime.  90 tablet  2  . cephALEXin (KEFLEX) 500 MG capsule Take 1 capsule (500 mg total) by mouth 4 (four) times daily.  40 capsule  0  Review of Systems  Constitutional: Negative for unexpected weight change, or unusual diaphoresis  HENT: Negative for tinnitus.   Eyes: Negative for photophobia and visual disturbance.  Respiratory: Negative for choking and stridor.   Gastrointestinal: Negative for vomiting and blood in stool.  Genitourinary: Negative for hematuria and decreased urine volume.  Musculoskeletal: Negative for acute joint swelling Skin: Negative for color change and wound.  Neurological: Negative for tremors and numbness other than noted  Psychiatric/Behavioral: Negative for decreased concentration or  hyperactivity.       Objective:   Physical Exam BP 118/76  Pulse 87  Temp 98.3 F (36.8 C) (Oral)  SpO2 95% VS noted, not ill appearing Constitutional: Pt appears well-developed and well-nourished.  HENT: Head: NCAT.  Right Ear: External ear normal.  Left Ear: External ear normal.  Eyes: Conjunctivae and EOM are normal. Pupils are equal, round, and reactive to light.  Neck: Normal range of motion. Neck supple.  Cardiovascular: Normal rate and regular rhythm.   Pulmonary/Chest: Effort normal and breath sounds normal.  Abd:  Soft, NT, non-distended, + BS Neurological: Pt is alert. Not confused  Skin: Skin is warm. No erythema. 1+ edema LLE, s/p BKA on right Psychiatric: Pt behavior is normal. Thought content normal.     Assessment & Plan:

## 2012-06-14 NOTE — Assessment & Plan Note (Signed)
Also due for dxa, for order today

## 2012-06-14 NOTE — Assessment & Plan Note (Signed)
stable overall by history and exam, recent data reviewed with pt, and pt to continue medical treatment as before,  to f/u any worsening symptoms or concerns, for f/u labs Lab Results  Component Value Date   [facility]  Value: 104        Total Cholesterol/HDL:CHD Risk Coronary Heart Disease Risk Table                     Men   Women  1/2 Average Risk   3.4   3.3  Average Risk       5.0   4.4  2 X Average Risk   9.6   7.1  3 X Average Risk  23.4   11.0        Use the calculated Patient Ratio above and the CHD Risk Table to determine the patient's CHD Risk.        ATP III CLASSIFICATION (LDL):  <100     mg/dL   Optimal  161-096  mg/dL   Near or Above                    Optimal  130-159  mg/dL   Borderline  045-409  mg/dL   High  >811     mg/dL   Very High* 01/30/7828

## 2012-06-14 NOTE — Patient Instructions (Addendum)
You had the pneumonia shot today Please continue all other medications as before, and refills have been done if requested. Please have the pharmacy call with any other refills you may need. Please have your blood work done at the lab in [place] Please let us know by MyChart message when this is done so that we can know to call for results Please keep your appointments with your specialists as you have planned  - orthopedic and Dr Wandra Mannan are otherwise up to date with prevention measures today. Thank you for enrolling in MyChart. Please follow the instructions below to securely access your online medical record. MyChart allows you to send messages to your doctor, view your test results, renew your prescriptions, schedule appointments, and more. To Log into My Chart online, please go by [employer] or [employer] to [email or web address], or download the MyChart App from the [employer] of [employer] .  Your Username is: [email or web address]  (pass:  Remigio Eisenmenger) Please send a Engineer, water on Mychart later today. Please schedule the bone density test before leaving today at the scheduling desk (where you check out) Please return in 6 months, or sooner if needed

## 2012-06-14 NOTE — Assessment & Plan Note (Signed)
Not charged, but due for pneumovax

## 2012-06-14 NOTE — Assessment & Plan Note (Addendum)
stable overall by history and exam, recent data reviewed with pt, and pt to continue medical treatment as before,  to f/u any worsening symptoms or concerns , for cbc with next labs at her lab in [place] she prefers  Note:  Total time for pt hx, exam, review of record with pt in the room, determination of diagnoses and plan for further eval and tx is > 40 min, with over 50% spent in coordination and counseling of patient

## 2012-06-14 NOTE — Assessment & Plan Note (Signed)
stable overall by history and exam, recent data reviewed with pt, and pt to continue medical treatment as before,  to f/u any worsening symptoms or concerns BP Readings from Last 3 Encounters:  06/14/12 118/76  06/13/12 130/62  05/20/12 123/72

## 2012-06-16 ENCOUNTER — Telehealth: Payer: Self-pay | Admitting: Internal Medicine

## 2012-06-16 ENCOUNTER — Ambulatory Visit: Payer: Medicare Other | Admitting: Physical Medicine and Rehabilitation

## 2012-06-16 ENCOUNTER — Ambulatory Visit: Payer: Medicare Other | Admitting: Internal Medicine

## 2012-06-16 NOTE — Telephone Encounter (Signed)
Patient informed of MD instructions. 

## 2012-06-16 NOTE — Telephone Encounter (Signed)
Ok to try metamucil daily otc, as this may help as well

## 2012-06-16 NOTE — Telephone Encounter (Signed)
Patient Information:  Caller Name: Aby  Phone: [phone]  Patient: Crystal Fitzpatrick, Crystal Fitzpatrick  Gender: Female  DOB: 1946-01-04  Age: 67 Years  PCP: [name] (Adults only)  Office Follow Up:  Does the office need to follow up with this patient?: Yes  Instructions For The Office: Caller wants to know if she can take something along with the Lomotil to help with diarrhea sx.  RN Note: RN instructed caller not to take Imodium while taking Lomotil.   Symptoms  Reason For Call & Symptoms: Diarrhea started about 2 hrs after Pnuemonia vaccine.  Afebrile. Caller states every time she eats crackers she has and episode of  diarrhea.  It doesn't happen when she drinks fluids.  Pt staying well hydrated.  Glucose levels averaging 100 mg/dL.   Pt producing  Watery diarrhea about 3 x daily.  Feeling better today.  No shortness of breath.  Moist mucosa.  Feels like her heart is racing when she lies down but doesn't notice it when she is sitting up.  Caller decllined appt and wants to know if she can take something else to help with the diarrhea.  Reviewed Health History In EMR: Yes  Reviewed Medications In EMR: Yes  Reviewed Allergies In EMR: Yes  Reviewed Surgeries / Procedures: Yes  Date of Onset of Symptoms: 06/14/2012  Treatments Tried: Lomitil, Immodium  Treatments Tried Worked: No  Guideline(s) Used:  Adult Immunizations, Indicated Based On Medical Conditions  Adult Immunization Schedule, by Vaccine and Age Group - [place]  Diarrhea  Disposition Per Guideline:   Home Care  Reason For Disposition Reached:   Mild diarrhea  Advice Given:  Reassurance:  In healthy adults, new-onset diarrhea is usually caused by a viral infection of the intestines, which you can treat at home. Diarrhea is the body's way of getting rid of the infection. Here are some tips on how to keep ahead of the fluid losses.  Fluids:  Drink more fluids, at least 8-10 glasses (8 oz or 240 ml) daily.  For example:  sports drinks, diluted fruit juices, soft drinks.  Supplement this with saltine crackers or soups to make certain that you are getting sufficient fluid and salt to meet your body's needs.  Nutrition:  Maintaining some food intake during episodes of diarrhea is important.  Ideal initial foods include boiled starches/cereals (e.g., potatoes, rice, noodles, wheat, oats) with a small amount of salt to taste.  Other acceptable foods include: bananas, yogurt, crackers, soup.  As your stools return to normal consistency, resume a normal diet.  Call Back If:  Signs of dehydration occur (e.g., no urine for more than 12 hours, very dry mouth, lightheaded, etc.)  Diarrhea lasts over 7 days  You become worse.

## 2012-07-05 ENCOUNTER — Inpatient Hospital Stay: Admission: RE | Admit: 2012-07-05 | Payer: Medicare Other | Source: Ambulatory Visit

## 2012-07-05 ENCOUNTER — Telehealth: Payer: Self-pay | Admitting: Internal Medicine

## 2012-07-05 NOTE — Telephone Encounter (Signed)
Requesting antibiotic for the swelling in legs.  Last time she had one that started with a C.  She didn't know the name.

## 2012-07-05 NOTE — Telephone Encounter (Signed)
[facility] Triage Call Report Triage Record Num: [id number] Operator: Roselyn Meier Patient Name: Castella Lerner Call Date & Time: 07/04/2012 5:18:57PM Patient Phone: [phone] PCP: [name] Patient Gender: Female PCP Fax : [phone] Patient DOB: 05/16/1946 Practice Name: [facility] Reason for Call: Caller: Bridget/Other; PCP: [name] (Adults only); CB#: [phone]; Call regarding Leg Is Red and Swollen; Onset either 2/16 or 07/04/12 with left leg red swollen and warm to touch. Triaged patient per Leg Non-Injury Protocol. See Provider within 4 hours Disposition for 'New marked swelling'. RN advised pt to be seen at the ED. Caller states pt would not go and requested for on call MD to be called. RN contacted Dr Dan Humphreys who agreed that pt needs to be seen at the ED. RN relayed information to the caller who states she will contact the patient and let her know. Protocol(s) Used: Leg Non-Injury Recommended Outcome per Protocol: See Provider within 4 hours Reason for Outcome: New marked swelling (twice the normal size as compared to usual appearance) Care Advice: ~ Call provider if symptoms worsen or new symptoms develop. Position affected part so it is elevated at least 12 inches (30 cm) above level of heart to improve circulation and decrease discomfort

## 2012-07-05 NOTE — Telephone Encounter (Signed)
Usually, an infection will involve swelling but also redness and often pain; with a fever and feeling bad as well if the infection is more than just a small area  Another concern might be a DVT (blood clot in the leg)  Ideally she should be seen today, with a [place] MD if possible (not sure I can see her though) or to Urgent Care would be more likely a better option, or ER if not able to do one of the other options

## 2012-07-05 NOTE — Telephone Encounter (Signed)
The patient is concerned that she only have an infection.  If MD thinks she may need doppler is ok as she will be in today for a bone density.  But the patient believes due to her history that she has an infection .  Please advise call back number to [facility] (care giver is [phone])

## 2012-07-05 NOTE — Telephone Encounter (Signed)
Crystal Fitzpatrick is aware of Dr. Raphael Gibney reply.  She made an appt for Wed AM with [facility].  They will call back if someone takes her to the ER or UC today.

## 2012-07-06 ENCOUNTER — Ambulatory Visit: Payer: Medicare Other | Admitting: Internal Medicine

## 2012-07-07 ENCOUNTER — Ambulatory Visit (INDEPENDENT_AMBULATORY_CARE_PROVIDER_SITE_OTHER): Payer: Medicare Other | Admitting: Internal Medicine

## 2012-07-07 ENCOUNTER — Telehealth: Payer: Self-pay

## 2012-07-07 ENCOUNTER — Other Ambulatory Visit (INDEPENDENT_AMBULATORY_CARE_PROVIDER_SITE_OTHER): Payer: Medicare Other

## 2012-07-07 ENCOUNTER — Encounter: Payer: Self-pay | Admitting: Internal Medicine

## 2012-07-07 VITALS — BP 120/84 | HR 82 | Temp 98.0°F

## 2012-07-07 DIAGNOSIS — M1990 Unspecified osteoarthritis, unspecified site: Secondary | ICD-10-CM

## 2012-07-07 DIAGNOSIS — N189 Chronic kidney disease, unspecified: Secondary | ICD-10-CM

## 2012-07-07 DIAGNOSIS — M129 Arthropathy, unspecified: Secondary | ICD-10-CM | POA: Diagnosis not present

## 2012-07-07 DIAGNOSIS — R609 Edema, unspecified: Secondary | ICD-10-CM

## 2012-07-07 DIAGNOSIS — L02419 Cutaneous abscess of limb, unspecified: Secondary | ICD-10-CM | POA: Diagnosis not present

## 2012-07-07 DIAGNOSIS — L03119 Cellulitis of unspecified part of limb: Secondary | ICD-10-CM | POA: Diagnosis not present

## 2012-07-07 DIAGNOSIS — L03116 Cellulitis of left lower limb: Secondary | ICD-10-CM

## 2012-07-07 LAB — BASIC METABOLIC PANEL
CO2: 26 mEq/L (ref 19–32)
Calcium: 9.2 mg/dL (ref 8.4–10.5)
Chloride: 110 mEq/L (ref 96–112)
Creatinine, Ser: 1.1 mg/dL (ref 0.4–1.2)
Sodium: 140 mEq/L (ref 135–145)

## 2012-07-07 MED ORDER — TOPIRAMATE 100 MG PO TABS: 300.0000 mg | ORAL_TABLET | Freq: Every day | ORAL | Status: DC

## 2012-07-07 MED ORDER — CEPHALEXIN 500 MG PO CAPS: 500.0000 mg | ORAL_CAPSULE | Freq: Four times a day (QID) | ORAL | Status: DC

## 2012-07-07 NOTE — Telephone Encounter (Signed)
[facility] called to let us know that the patient has lost 1 of her pain patches because they do not have the proper cover to keep them on.

## 2012-07-07 NOTE — Assessment & Plan Note (Signed)
   eRx for Keflex QID x 10 days

## 2012-07-07 NOTE — Assessment & Plan Note (Signed)
Continue current medication at this time Will check rheumatoid panel May benefit from referral to rheumatology

## 2012-07-07 NOTE — Progress Notes (Signed)
Subjective:    Patient ID: Crystal Fitzpatrick, female    DOB: 06-06-1945, 67 y.o.   MRN: [id number]  HPI  Pt presents to the clinic today with c/o LLE redness and swelling. This has been a chronic problem for her. She has been in the hospital recently at which point she was taken down on her lasix from 40 mg daily to 20 mg every other day due to a decrease in her kidney function. She has noticed an increase in swelling and redness since that time. The area is painful but it does not itch. It has not weeped. She has kept her leg rapped up in a compression swelling to keep it down. Additionally, she does have some concerns about worsening arthritis. She thinks that she should have a rheumatoid panel rechecked as it was elevated in the past. She does take cymbalta, voltaren and percocet for arthritis pain. She has never seen a rheumatologist.  Review of Systems      Past Medical History  Diagnosis Date  . DIABETES MELLITUS, TYPE II 07/17/2008  . B12 DEFICIENCY 07/17/2008  . Acute gouty arthropathy 12/11/2008  . HYPONATREMIA 03/20/2010  . HYPERKALEMIA 10/31/2009  . ANEMIA-NOS 07/17/2008  . ANXIETY 07/17/2008  . Chronic pain syndrome 02/14/2010  . Trigeminal neuralgia 02/14/2010  . HEARING LOSS, RIGHT EAR 06/04/2009  . HYPERTENSION 07/17/2008  . CHF 01/28/2009  . Pneumonia, organism unspecified 02/14/2010  . GERD 07/17/2008  . IBS 06/04/2009  . UNSPECIFIED HEPATITIS 01/28/2009  . RENAL INSUFFICIENCY 10/31/2009  . MENOPAUSAL DISORDER 12/03/2009  . Cellulitis and abscess of leg, except foot 11/15/2008  . SKIN ULCER, CHRONIC 02/08/2009  . Rheumatoid arthritis 07/17/2008  . SKIN LESION 06/04/2009  . ARTHRITIS 01/28/2009  . FOOT PAIN 07/17/2008  . HYPERSOMNIA 02/14/2010  . Altered mental status 02/07/2010  . PERIPHERAL EDEMA 08/30/2008  . Wheezing 12/03/2009  . ABDOMINAL DISTENSION 10/11/2008  . Dysuria 11/11/2009  . Hypoxemia 03/20/2010  . ANAPHYLACTIC SHOCK 05/15/2009  . NEUROPATHY, HX OF 01/28/2009  . COLONIC POLYPS, HX  OF 07/17/2008  . AMPUTATION, BELOW KNEE, RIGHT, HX OF 01/28/2009  . Hyperlipemia 09/24/2010  . Hyperlipidemia 09/24/2010  . Seizure 10/26/2010  . Pneumonia, aspiration 12/01/2011    Episode July 2013, tx per [facility]    Current Outpatient Prescriptions  Medication Sig Dispense Refill  . albuterol (PROAIR HFA) 108 (90 BASE) MCG/ACT inhaler Inhale 2 puffs into the lungs every 6 (six) hours as needed.        . ALPRAZolam (XANAX) 0.25 MG tablet TAKE 1 TABLET BY MOUTH 3 TIMES DAILY    AS NEEDED  90 tablet  2  . Ascorbic Acid (VITAMIN C) 500 MG tablet Take 500 mg by mouth daily.        [name] atorvastatin (LIPITOR) 10 MG tablet Take 10 mg by mouth daily.      [name] BENICAR 40 MG tablet TAKE 1 TABLET BY MOUTH EVERY DAY FOR    BLOOD PRESSURE  30 tablet  11  . Blood Glucose Monitoring Suppl (ONE TOUCH ULTRA 2) W/DEVICE KIT 1 Device by Does not apply route once.  1 each  0  . Blood Glucose Monitoring Suppl (ONE TOUCH ULTRA SYSTEM KIT) W/DEVICE KIT 1 kit by Does not apply route once.  1 each  0  . cephALEXin (KEFLEX) 500 MG capsule Take 1 capsule (500 mg total) by mouth 4 (four) times daily.  40 capsule  0  . Cholecalciferol (VITAMIN D) 400 UNITS capsule Take 400  Units by mouth daily.        . cyclobenzaprine (FLEXERIL) 10 MG tablet Take 1 tablet (10 mg total) by mouth every 8 (eight) hours as needed.  90 tablet  5  . diclofenac sodium (VOLTAREN) 1 % GEL Apply 2 g topically 4 (four) times daily.  3 Tube  3  . diphenoxylate-atropine (LOMOTIL) 2.5-0.025 MG per tablet TAKE ONE TABLET TWICE DAILY AS NEEDED  60 tablet  2  . DULoxetine (CYMBALTA) 60 MG capsule Take 2 capsules (120 mg total) by mouth daily.  60 capsule  5  . EPINEPHrine (EPIPEN 2-PAK) 0.3 mg/0.3 mL DEVI Inject 0.3 mLs (0.3 mg total) into the muscle once. Use as directed  1 Device  0  . fentaNYL (DURAGESIC - DOSED MCG/HR) 75 MCG/HR Place 1 patch (75 mcg total) onto the skin every 3 (three) days. 1 patch every 3 days  10 patch  0  .  furosemide (LASIX) 20 MG tablet Take 20 mg by mouth every other day.      . gabapentin (NEURONTIN) 600 MG tablet Take 1 tablet (600 mg total) by mouth 3 (three) times daily.  90 tablet  3  . lidocaine (LIDODERM) 5 % Place 1 patch onto the skin. Remove & Discard patch within 12 hours or as directed by MD       . loperamide (ANTI-DIARRHEAL) 2 MG tablet Take 2 mg by mouth 2 (two) times daily as needed.        . Multiple Vitamins-Minerals (CENTRUM SILVER ULTRA WOMENS PO) Take by mouth daily.        . Omega-3 Fatty Acids (FISH OIL) 1000 MG CAPS Take by mouth daily.        Letta Pate DELICA LANCETS MISC Use as directed to test blood sugar dx 250.00  100 each  2  . oxyCODONE-acetaminophen (PERCOCET) 10-325 MG per tablet Take 1 tablet by mouth every 4 (four) hours as needed for pain.  130 tablet  0  . pantoprazole (PROTONIX) 40 MG tablet TAKE ONE (1) TABLET BY MOUTH EVERY DAY  FOR ACID REFLUX  30 tablet  11  . promethazine (PHENERGAN) 25 MG tablet TAKE 1 TABLET BY MOUTH EVERY 6 HOURS    AS NEEDED FOR NAUSEA  60 tablet  1  . Psyllium (METAMUCIL) 30.9 % POWD Take by mouth. Take as directed       . topiramate (TOPAMAX) 100 MG tablet Take 3 tablets (300 mg total) by mouth at bedtime.  90 tablet  2   No current facility-administered medications for this visit.    Allergies  Allergen Reactions  . Morphine Anaphylaxis  . Sulfonamide Derivatives Diarrhea  . Ciprofloxacin Diarrhea  . Doxycycline     Nausea, diarrhea  . Nitrofurantoin     REACTION: itch  . Prednisone     Fast heart rate  . Erythromycin Rash    Family History  Problem Relation Age of Onset  . Breast cancer Sister   . Diabetes Other   . Stroke Other     Grandparents    History   Social History  . Marital Status: Divorced    Spouse Name: N/A    Number of Children: 1  . Years of Education: N/A   Occupational History  . retired Marketing executive    Social History Main Topics  . Smoking status: Former Games developer   . Smokeless tobacco: Never Used  . Alcohol Use: No  . Drug Use: No  . Sexually Active: Not on  file   Other Topics Concern  . Not on file   Social History Narrative  . No narrative on file     Constitutional: Denies fever, malaise, fatigue, headache or abrupt weight changes.  Respiratory: Denies difficulty breathing, shortness of breath, cough or sputum production.   Cardiovascular: Pt reports swelling of the LLE. Denies chest pain, chest tightness, palpitations.  Musculoskeletal: Pt reports worsening pain in joints. Denies decrease in range of motion, muscle pain.  Skin: Pt reports redness of the LLE.Denies rashes, lesions or ulcercations.  Neurological: Denies dizziness, difficulty with memory, difficulty with speech.  No other specific complaints in a complete review of systems (except as listed in HPI above).   Objective:   Physical Exam   BP 120/84  Pulse 82  Temp(Src) 98 F (36.7 C) (Oral)  SpO2 96% Wt Readings from Last 3 Encounters:  06/13/12 280 lb (127.007 kg)  05/20/12 280 lb (127.007 kg)  04/20/12 280 lb (127.007 kg)    General: Appears their stated age, well developed, well nourished in NAD. Skin: Cellulitis noted on the LLE. No rashes, lesions or ulcerations noted.  Cardiovascular: 3+ pitting edema of the LLE. Normal rate and rhythm. S1,S2 noted.  No murmur, rubs or gallops noted. No JVD. No carotid bruits noted. Pulmonary/Chest: Normal effort and positive vesicular breath sounds. No respiratory distress. No wheezes, rales or ronchi noted.  Musculoskeletal: Normal range of motion. No signs of joint swelling. No difficulty with gait.  Neurological: Alert and oriented. Cranial nerves II-XII intact. Coordination normal. +DTRs bilaterally.       Assessment & Plan:    RTC as needed or if symptoms persist

## 2012-07-07 NOTE — Patient Instructions (Signed)
Cellulitis Cellulitis is an infection of the skin and the tissue beneath it. The infected area is usually red and tender. Cellulitis occurs most often in the arms and lower legs.   CAUSES   Cellulitis is caused by bacteria that enter the skin through cracks or cuts in the skin. The most common types of bacteria that cause cellulitis are Staphylococcus and Streptococcus. SYMPTOMS    Redness and warmth.   Swelling.   Tenderness or pain.   Fever.  DIAGNOSIS  Your caregiver can usually determine what is wrong based on a physical exam. Blood tests may also be done. TREATMENT   Treatment usually involves taking an antibiotic medicine. HOME CARE INSTRUCTIONS    Take your antibiotics as directed. Finish them even if you start to feel better.   Keep the infected arm or leg elevated to reduce swelling.   Apply a warm cloth to the affected area up to 4 times per day to relieve pain.   Only take over-the-counter or prescription medicines for pain, discomfort, or fever as directed by your caregiver.   Keep all follow-up appointments as directed by your caregiver.  SEEK MEDICAL CARE IF:    You notice red streaks coming from the infected area.   Your red area gets larger or turns dark in color.   Your bone or joint underneath the infected area becomes painful after the skin has healed.   Your infection returns in the same area or another area.   You notice a swollen bump in the infected area.   You develop new symptoms.  SEEK IMMEDIATE MEDICAL CARE IF:    You have a fever.   You feel very sleepy.   You develop vomiting or diarrhea.   You have a general ill feeling (malaise) with muscle aches and pains.  MAKE SURE YOU:    Understand these instructions.   Will watch your condition.   Will get help right away if you are not doing well or get worse.  Document Released: 02/11/2005 Document Revised: 11/03/2011 Document Reviewed: 07/20/2011 [employer] Patient Information 2013  [employer]    

## 2012-07-07 NOTE — Telephone Encounter (Signed)
topamax refilled

## 2012-07-07 NOTE — Assessment & Plan Note (Signed)
Will check kidney function today If stable, will make Lasix 20 mg daily Continue to avoid salt in your diet Continue to use compression stocking

## 2012-07-08 ENCOUNTER — Other Ambulatory Visit: Payer: Self-pay | Admitting: Internal Medicine

## 2012-07-08 LAB — ANA: Anti Nuclear Antibody(ANA): NEGATIVE

## 2012-07-08 LAB — RHEUMATOID FACTOR: Rhuematoid fact SerPl-aCnc: <10 IU/mL (ref <=14)

## 2012-07-08 MED ORDER — FUROSEMIDE 20 MG PO TABS: 20.0000 mg | ORAL_TABLET | Freq: Every day | ORAL | Status: DC

## 2012-07-11 ENCOUNTER — Encounter: Payer: Self-pay | Admitting: Physical Medicine and Rehabilitation

## 2012-07-11 ENCOUNTER — Encounter
Payer: Medicare Other | Attending: Physical Medicine and Rehabilitation | Admitting: Physical Medicine and Rehabilitation

## 2012-07-11 VITALS — BP 136/62 | HR 96 | Resp 14 | Ht 70.0 in | Wt 280.0 lb

## 2012-07-11 DIAGNOSIS — L02419 Cutaneous abscess of limb, unspecified: Secondary | ICD-10-CM | POA: Diagnosis not present

## 2012-07-11 DIAGNOSIS — M47817 Spondylosis without myelopathy or radiculopathy, lumbosacral region: Secondary | ICD-10-CM

## 2012-07-11 DIAGNOSIS — L03116 Cellulitis of left lower limb: Secondary | ICD-10-CM

## 2012-07-11 DIAGNOSIS — Z89511 Acquired absence of right leg below knee: Secondary | ICD-10-CM

## 2012-07-11 DIAGNOSIS — G546 Phantom limb syndrome with pain: Secondary | ICD-10-CM

## 2012-07-11 DIAGNOSIS — L03119 Cellulitis of unspecified part of limb: Secondary | ICD-10-CM

## 2012-07-11 DIAGNOSIS — G547 Phantom limb syndrome without pain: Secondary | ICD-10-CM

## 2012-07-11 DIAGNOSIS — S88119A Complete traumatic amputation at level between knee and ankle, unspecified lower leg, initial encounter: Secondary | ICD-10-CM

## 2012-07-11 DIAGNOSIS — E119 Type 2 diabetes mellitus without complications: Secondary | ICD-10-CM

## 2012-07-11 DIAGNOSIS — M069 Rheumatoid arthritis, unspecified: Secondary | ICD-10-CM

## 2012-07-11 DIAGNOSIS — G609 Hereditary and idiopathic neuropathy, unspecified: Secondary | ICD-10-CM

## 2012-07-11 MED ORDER — OXYCODONE-ACETAMINOPHEN 10-325 MG PO TABS: 1.0000 | ORAL_TABLET | ORAL | Status: DC | PRN

## 2012-07-11 MED ORDER — FENTANYL 75 MCG/HR TD PT72: 1.0000 | MEDICATED_PATCH | TRANSDERMAL | Status: DC

## 2012-07-11 NOTE — Patient Instructions (Signed)
Continue with exercises learned from PT

## 2012-07-11 NOTE — Progress Notes (Signed)
Subjective:    Patient ID: Crystal Fitzpatrick, female    DOB: 08/16/1945, 67 y.o.   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 After lymph edema in 2010 she was not able to wear a prosthetic leg anymore. Since then she was in a wheelchair.She has restarted with PT and wearing her prosthetic leg, until she fell about 5 weeks ago.  Left patella fx (per pt) after fall at home 5 weeks ago, she saw Dr. Marva Panda, orthopedic surgeon in [place], he diagnosed her with a fissure in the left patella, and with some cysts, per patient, patient will call his office to send [place] the images and his notes.  The patient is doing exercises she learned from PT at home now, she is not able to walk at this point, because of her left knee pain.   Pain Inventory Average Pain 5 Pain Right Now 6 My pain is sharp and burning  In the last 24 hours, has pain interfered with the following? General activity 5 Relation with others 6 Enjoyment of life 3 What TIME of day is your pain at its worst? night and morning Sleep (in general) Fair  Pain is worse with: inactivity and standing Pain improves with: rest, therapy/exercise, medication and injections Relief from Meds: 8  Mobility ability to climb steps?  no do you drive?  no  Function disabled: date disabled 2009 I need assistance with the following:  shopping  Neuro/Psych tremor  Prior Studies Any changes since last visit?  no  Physicians involved in your care Any changes since last visit?  no   Family History  Problem Relation Age of Onset  . Breast cancer Sister   . Diabetes Other   . Stroke Other     Grandparents   History   Social History  . Marital Status: Divorced    Spouse Name: N/A    Number of Children: 1  . Years of Education: N/A   Occupational History  . retired Marketing executive    Social History Main Topics  . Smoking status: Former Games developer  . Smokeless tobacco: Never  Used  . Alcohol Use: No  . Drug Use: No  . Sexually Active: None   Other Topics Concern  . None   Social History Narrative  . None   Past Surgical History  Procedure Laterality Date  . Amputation  02/02/2008    below right knee  . S/p egd  2007    with Botox for? Achalasia  . Tonsillectomy    . S/p breast biopsy  1992  . 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 Tubal ligation    . Nasal septum surgery    . Foot surgury  1980 and 1981   Past Medical History  Diagnosis Date  . DIABETES MELLITUS, TYPE II 07/17/2008  . B12 DEFICIENCY 07/17/2008  . Acute gouty arthropathy 12/11/2008  . HYPONATREMIA 03/20/2010  . HYPERKALEMIA 10/31/2009  . ANEMIA-NOS 07/17/2008  . ANXIETY 07/17/2008  . Chronic pain syndrome 02/14/2010  . Trigeminal neuralgia 02/14/2010  . HEARING LOSS, RIGHT EAR 06/04/2009  . HYPERTENSION 07/17/2008  . CHF 01/28/2009  . Pneumonia, organism unspecified 02/14/2010  . GERD 07/17/2008  . IBS 06/04/2009  . UNSPECIFIED HEPATITIS 01/28/2009  . RENAL INSUFFICIENCY 10/31/2009  . MENOPAUSAL DISORDER 12/03/2009  . Cellulitis and abscess of leg, except foot 11/15/2008  . SKIN ULCER, CHRONIC 02/08/2009  . Rheumatoid arthritis 07/17/2008  .  SKIN LESION 06/04/2009  . ARTHRITIS 01/28/2009  . FOOT PAIN 07/17/2008  . HYPERSOMNIA 02/14/2010  . Altered mental status 02/07/2010  . PERIPHERAL EDEMA 08/30/2008  . Wheezing 12/03/2009  . ABDOMINAL DISTENSION 10/11/2008  . Dysuria 11/11/2009  . Hypoxemia 03/20/2010  . ANAPHYLACTIC SHOCK 05/15/2009  . NEUROPATHY, HX OF 01/28/2009  . COLONIC POLYPS, HX OF 07/17/2008  . AMPUTATION, BELOW KNEE, RIGHT, HX OF 01/28/2009  . Hyperlipemia 09/24/2010  . Hyperlipidemia 09/24/2010  . Seizure 10/26/2010  . Pneumonia, aspiration 12/01/2011    Episode July 2013, tx per [facility]   BP 136/62  Pulse 96  Resp 14  Ht 5\' 10"  (1.778 m)  Wt 280 lb (127.007 kg)  BMI 40.18 kg/m2  SpO2 93%   Review of Systems  Cardiovascular: Positive for leg swelling.   Gastrointestinal: Positive for diarrhea.  Skin: Positive for wound.  Neurological: Positive for tremors.  All other systems reviewed and are negative.       Objective:   Physical Exam Constitutional: She appears well-developed and well-nourished.  HENT:  Head: Normocephalic and atraumatic.  Nose: Nose normal.  Mouth/Throat: Oropharynx is clear and moist.  Eyes: Conjunctivae are normal.  Neck: Normal range of motion. Neck supple.  Cardiovascular: Normal rate and regular rhythm.  Pulmonary/Chest: No respiratory distress. She has no wheezes.  Musculoskeletal:  Edema 2++LLE. Chronic stasis changes are noted on the left leg. Leg is dressed. Weight is stable but she remains morbidly obese. Left knee is slightly swollen, patella is tender to touch. No bruising is seen. She is not wearing her KI.  Extension deficit of left knee about 15 degrees, flexion 110 degrees  Strength grossly intact, except right lower leg.  Neurological: A sensory deficit (left foot) is present.  Psychiatric: She has a normal mood and affect. Her behavior is normal. Judgment and thought content normal.         Assessment & Plan:  1. Right below-knee amputation, history of phantom limb pain.  2. Peripheral neuropathy.  3. History of rheumatoid arthritis.  4. Osteoarthritis of left knee-improved after synvisc  5. Chronic cellulitis, left leg with associated edema.  6. Left patella fx (per pt) after fall at home 5 weeks ago, she saw Dr. Marva Panda, orthopedic surgeon in [place], he diagnosed her with a fissure in the left patella, and with some cysts, per patient, patient will call his office to send [place] the images and his notes.  PLAN:  1. Continue compression stocking to right leg. Advised patient to continue with the exercises she learned from Pt, also showed her some exercises to improve her left knee extension.  2. Continue wound care per the [facility] wound care clinic. The compression wrap is helping her.  3. We  reviewed the fact that she should be wearing her KI at all times! I don't know what the extent of this patella fx, but I think she should be wearing this until she follows up with ortho. Patient states, that her orthopedic surgeon has told her not to wear her KI.  4. Refilled her pain medications today including Percocet 10/325, #130  and fentanyl 75 mcg #10.  -still could consider pain psychology consult with Dr. Gennie Alma  -goal will be to taper meds  6.She will follow up with my PA in one month.

## 2012-07-14 ENCOUNTER — Telehealth: Payer: Self-pay

## 2012-07-14 NOTE — Telephone Encounter (Signed)
[facility] informed of MD instructions.

## 2012-07-14 NOTE — Telephone Encounter (Signed)
Message copied by Pincus Sanes on Thu Jul 14, 2012  1:37 PM ------      Message from: Corwin Levins      Created: Thu Jul 14, 2012 12:41 PM       Pt has had mult sugars normal in the past yr.  Ok to hold on a1c for now, will consider check next visit      ----- Message -----         From: Vladimir Crofts Ewing         Sent: 07/14/2012   9:50 AM           To: Corwin Levins, MD            [facility] called to inform this patient has not had an a1c since 2012 in November.  [facility] thought this patient might be due some labs advise please       ------

## 2012-07-15 DIAGNOSIS — I890 Lymphedema, not elsewhere classified: Secondary | ICD-10-CM | POA: Diagnosis not present

## 2012-07-15 DIAGNOSIS — L8993 Pressure ulcer of unspecified site, stage 3: Secondary | ICD-10-CM | POA: Diagnosis not present

## 2012-07-15 DIAGNOSIS — L03211 Cellulitis of face: Secondary | ICD-10-CM | POA: Diagnosis not present

## 2012-07-15 DIAGNOSIS — L0201 Cutaneous abscess of face: Secondary | ICD-10-CM | POA: Diagnosis not present

## 2012-07-15 DIAGNOSIS — I10 Essential (primary) hypertension: Secondary | ICD-10-CM | POA: Diagnosis not present

## 2012-07-15 DIAGNOSIS — I872 Venous insufficiency (chronic) (peripheral): Secondary | ICD-10-CM | POA: Diagnosis not present

## 2012-07-15 DIAGNOSIS — I509 Heart failure, unspecified: Secondary | ICD-10-CM | POA: Diagnosis not present

## 2012-07-15 DIAGNOSIS — L89309 Pressure ulcer of unspecified buttock, unspecified stage: Secondary | ICD-10-CM | POA: Diagnosis not present

## 2012-07-15 DIAGNOSIS — I499 Cardiac arrhythmia, unspecified: Secondary | ICD-10-CM | POA: Diagnosis not present

## 2012-07-15 DIAGNOSIS — Z9981 Dependence on supplemental oxygen: Secondary | ICD-10-CM | POA: Diagnosis not present

## 2012-07-20 ENCOUNTER — Other Ambulatory Visit: Payer: Self-pay

## 2012-07-20 MED ORDER — GABAPENTIN 600 MG PO TABS: 600.0000 mg | ORAL_TABLET | Freq: Three times a day (TID) | ORAL | Status: DC

## 2012-08-04 DIAGNOSIS — I890 Lymphedema, not elsewhere classified: Secondary | ICD-10-CM | POA: Diagnosis not present

## 2012-08-04 DIAGNOSIS — I509 Heart failure, unspecified: Secondary | ICD-10-CM | POA: Diagnosis not present

## 2012-08-04 DIAGNOSIS — L89309 Pressure ulcer of unspecified buttock, unspecified stage: Secondary | ICD-10-CM | POA: Diagnosis not present

## 2012-08-04 DIAGNOSIS — E1149 Type 2 diabetes mellitus with other diabetic neurological complication: Secondary | ICD-10-CM | POA: Diagnosis not present

## 2012-08-04 DIAGNOSIS — M2040 Other hammer toe(s) (acquired), unspecified foot: Secondary | ICD-10-CM | POA: Diagnosis not present

## 2012-08-04 DIAGNOSIS — L0201 Cutaneous abscess of face: Secondary | ICD-10-CM | POA: Diagnosis not present

## 2012-08-04 DIAGNOSIS — I872 Venous insufficiency (chronic) (peripheral): Secondary | ICD-10-CM | POA: Diagnosis not present

## 2012-08-04 DIAGNOSIS — I10 Essential (primary) hypertension: Secondary | ICD-10-CM | POA: Diagnosis not present

## 2012-08-04 DIAGNOSIS — Z9981 Dependence on supplemental oxygen: Secondary | ICD-10-CM | POA: Diagnosis not present

## 2012-08-04 DIAGNOSIS — L8993 Pressure ulcer of unspecified site, stage 3: Secondary | ICD-10-CM | POA: Diagnosis not present

## 2012-08-04 DIAGNOSIS — L03211 Cellulitis of face: Secondary | ICD-10-CM | POA: Diagnosis not present

## 2012-08-04 DIAGNOSIS — I83219 Varicose veins of right lower extremity with both ulcer of unspecified site and inflammation: Secondary | ICD-10-CM | POA: Diagnosis not present

## 2012-08-04 DIAGNOSIS — E1142 Type 2 diabetes mellitus with diabetic polyneuropathy: Secondary | ICD-10-CM | POA: Diagnosis not present

## 2012-08-04 DIAGNOSIS — I83229 Varicose veins of left lower extremity with both ulcer of unspecified site and inflammation: Secondary | ICD-10-CM | POA: Diagnosis not present

## 2012-08-04 DIAGNOSIS — I499 Cardiac arrhythmia, unspecified: Secondary | ICD-10-CM | POA: Diagnosis not present

## 2012-08-04 DIAGNOSIS — B351 Tinea unguium: Secondary | ICD-10-CM | POA: Diagnosis not present

## 2012-08-05 ENCOUNTER — Encounter: Payer: Self-pay | Admitting: Physical Medicine and Rehabilitation

## 2012-08-05 ENCOUNTER — Encounter
Payer: Medicare Other | Attending: Physical Medicine and Rehabilitation | Admitting: Physical Medicine and Rehabilitation

## 2012-08-05 VITALS — BP 138/68 | HR 82 | Resp 16 | Ht 70.0 in | Wt 280.0 lb

## 2012-08-05 DIAGNOSIS — N289 Disorder of kidney and ureter, unspecified: Secondary | ICD-10-CM | POA: Insufficient documentation

## 2012-08-05 DIAGNOSIS — L03119 Cellulitis of unspecified part of limb: Secondary | ICD-10-CM | POA: Diagnosis not present

## 2012-08-05 DIAGNOSIS — M79609 Pain in unspecified limb: Secondary | ICD-10-CM | POA: Insufficient documentation

## 2012-08-05 DIAGNOSIS — M1710 Unilateral primary osteoarthritis, unspecified knee: Secondary | ICD-10-CM | POA: Diagnosis not present

## 2012-08-05 DIAGNOSIS — I10 Essential (primary) hypertension: Secondary | ICD-10-CM | POA: Insufficient documentation

## 2012-08-05 DIAGNOSIS — E119 Type 2 diabetes mellitus without complications: Secondary | ICD-10-CM | POA: Diagnosis not present

## 2012-08-05 DIAGNOSIS — L02419 Cutaneous abscess of limb, unspecified: Secondary | ICD-10-CM | POA: Diagnosis not present

## 2012-08-05 DIAGNOSIS — M069 Rheumatoid arthritis, unspecified: Secondary | ICD-10-CM | POA: Diagnosis not present

## 2012-08-05 DIAGNOSIS — G547 Phantom limb syndrome without pain: Secondary | ICD-10-CM | POA: Insufficient documentation

## 2012-08-05 DIAGNOSIS — L03116 Cellulitis of left lower limb: Secondary | ICD-10-CM

## 2012-08-05 DIAGNOSIS — G609 Hereditary and idiopathic neuropathy, unspecified: Secondary | ICD-10-CM | POA: Insufficient documentation

## 2012-08-05 DIAGNOSIS — E785 Hyperlipidemia, unspecified: Secondary | ICD-10-CM | POA: Diagnosis not present

## 2012-08-05 DIAGNOSIS — S88119A Complete traumatic amputation at level between knee and ankle, unspecified lower leg, initial encounter: Secondary | ICD-10-CM | POA: Diagnosis not present

## 2012-08-05 MED ORDER — OXYCODONE-ACETAMINOPHEN 10-325 MG PO TABS: 1.0000 | ORAL_TABLET | ORAL | Status: DC | PRN

## 2012-08-05 MED ORDER — FENTANYL 75 MCG/HR TD PT72: 1.0000 | MEDICATED_PATCH | TRANSDERMAL | Status: DC

## 2012-08-05 NOTE — Progress Notes (Signed)
Subjective:    Patient ID: Crystal Fitzpatrick, female    DOB: December 08, 1945, 67 y.o.   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 After lymph edema in 2010 she was not able to wear a prosthetic leg anymore. Since then she was in a wheelchair.She has restarted with PT and wearing her prosthetic leg, until she fell about 5 weeks ago.  Left patella fx (per pt) after fall at home 5 weeks ago, she saw Dr. Marva Panda, orthopedic surgeon in [place], he diagnosed her with a fissure in the left patella, and with some cysts, per patient, patient will call his office to send [place] the images and his notes.  The patient is not doing exercises she learned from PT at this point, she has cellulitis of her LLE, she is on Ax, and treated by the wound clinic, she will follow up with them in about 2 weeks, she is not able to walk at this point, because of this.  Pain Inventory Average Pain 5 Pain Right Now 5 My pain is intermittent, sharp, burning, stabbing and aching  In the last 24 hours, has pain interfered with the following? General activity 3 Relation with others 3 Enjoyment of life 2 What TIME of day is your pain at its worst? morning and night Sleep (in general) Fair  Pain is worse with: sitting and inactivity Pain improves with: rest, therapy/exercise and medication Relief from Meds: 9  Mobility use a wheelchair transfers alone  Function I need assistance with the following:  shopping  Neuro/Psych tremor  Prior Studies Any changes since last visit?  no  Physicians involved in your care Any changes since last visit?  no   Family History  Problem Relation Age of Onset  . Breast cancer Sister   . Diabetes Other   . Stroke Other     Grandparents   History   Social History  . Marital Status: Divorced    Spouse Name: N/A    Number of Children: 1  . Years of Education: N/A   Occupational History  . retired Marketing executive     Social History Main Topics  . Smoking status: Former Games developer  . Smokeless tobacco: Never Used  . Alcohol Use: No  . Drug Use: No  . Sexually Active: None   Other Topics Concern  . None   Social History Narrative  . None   Past Surgical History  Procedure Laterality Date  . Amputation  02/02/2008    below right knee  . S/p egd  2007    with Botox for? Achalasia  . Tonsillectomy    . S/p breast biopsy  1992  . 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 Tubal ligation    . Nasal septum surgery    . Foot surgury  1980 and 1981   Past Medical History  Diagnosis Date  . DIABETES MELLITUS, TYPE II 07/17/2008  . B12 DEFICIENCY 07/17/2008  . Acute gouty arthropathy 12/11/2008  . HYPONATREMIA 03/20/2010  . HYPERKALEMIA 10/31/2009  . ANEMIA-NOS 07/17/2008  . ANXIETY 07/17/2008  . Chronic pain syndrome 02/14/2010  . Trigeminal neuralgia 02/14/2010  . HEARING LOSS, RIGHT EAR 06/04/2009  . HYPERTENSION 07/17/2008  . CHF 01/28/2009  . Pneumonia, organism unspecified 02/14/2010  . GERD 07/17/2008  . IBS 06/04/2009  . UNSPECIFIED HEPATITIS 01/28/2009  . RENAL INSUFFICIENCY 10/31/2009  . MENOPAUSAL DISORDER 12/03/2009  . Cellulitis and abscess of  leg, except foot 11/15/2008  . SKIN ULCER, CHRONIC 02/08/2009  . Rheumatoid arthritis 07/17/2008  . SKIN LESION 06/04/2009  . ARTHRITIS 01/28/2009  . FOOT PAIN 07/17/2008  . HYPERSOMNIA 02/14/2010  . Altered mental status 02/07/2010  . PERIPHERAL EDEMA 08/30/2008  . Wheezing 12/03/2009  . ABDOMINAL DISTENSION 10/11/2008  . Dysuria 11/11/2009  . Hypoxemia 03/20/2010  . ANAPHYLACTIC SHOCK 05/15/2009  . NEUROPATHY, HX OF 01/28/2009  . COLONIC POLYPS, HX OF 07/17/2008  . AMPUTATION, BELOW KNEE, RIGHT, HX OF 01/28/2009  . Hyperlipemia 09/24/2010  . Hyperlipidemia 09/24/2010  . Seizure 10/26/2010  . Pneumonia, aspiration 12/01/2011    Episode July 2013, tx per [facility]   BP 138/68  Pulse 82  Resp 16  Ht 5\' 10"  (1.778 m)  Wt 280 lb (127.007 kg)  BMI  40.18 kg/m2  SpO2 95%     Review of Systems  Musculoskeletal: Positive for myalgias and arthralgias.  Neurological: Positive for tremors.  All other systems reviewed and are negative.       Objective:   Physical Exam Constitutional: She appears well-developed and well-nourished.  HENT:  Head: Normocephalic and atraumatic.  Nose: Nose normal.  Mouth/Throat: Oropharynx is clear and moist.  Eyes: Conjunctivae are normal.  Neck: Normal range of motion. Neck supple.  Cardiovascular: Normal rate and regular rhythm.  Pulmonary/Chest: No respiratory distress. She has no wheezes.  Musculoskeletal:  Edema 2++LLE. Chronic stasis changes are noted on the left leg. Leg is dressed. Weight is stable but she remains morbidly obese. Left leg is swollen. She is not wearing her KI.  Extension deficit of left knee about 15 degrees, flexion 110 degrees  Strength grossly intact, except right lower leg.         Assessment & Plan:  1. Right below-knee amputation, history of phantom limb pain.  2. Peripheral neuropathy.  3. History of rheumatoid arthritis.  4. Osteoarthritis of left knee-improved after synvisc  5. Chronic cellulitis, left leg with associated edema, treated by the wound clinic with Ax and rest.  6. Left patella fx (per pt) after fall at home 5 weeks ago, she saw Dr. Marva Panda, orthopedic surgeon in [place], he diagnosed her with a fissure in the left patella, and with some cysts, per patient, patient will call his office to send [place] the images and his notes.  PLAN:  1. Continue compression stocking to right leg. Advised patient to continue with the exercises she learned from Pt, when her cellulitis has resolved.  2. Continue wound care per the [facility] wound care clinic.  3. We reviewed the fact that she should be wearing her KI at all times! I don't know what the extent of this patella fx, but I think she should be wearing this until she follows up with ortho. Patient states, that her  orthopedic surgeon has told her not to wear her KI.  4. Refilled her pain medications today including Percocet 10/325, #130  and fentanyl 75 mcg #10.  -still could consider pain psychology consult with Dr. Gennie Alma  -goal will be to taper meds  6.She will follow up with PA in one month. Consider aquatic therapy, when cellulitis and open scratch has resolved.

## 2012-08-05 NOTE — Patient Instructions (Signed)
Elevate your leg during the day until you follow up with the wound clinic in 2 weeks

## 2012-08-16 ENCOUNTER — Telehealth: Payer: Self-pay | Admitting: *Deleted

## 2012-08-16 NOTE — Telephone Encounter (Signed)
Kinsley called and has had 3 patches fail.  Bridgett says that Dani is running a fever again and it seems to be during fevers that the patches have problem sticking.  They both said that the patches are shriveling up.

## 2012-08-17 NOTE — Telephone Encounter (Signed)
Needs to dx and rx fever!!!

## 2012-08-17 NOTE — Telephone Encounter (Signed)
i don't believe using long acting opiates are safe in her case. Can try to place these in areas which remain cooler or we need to wean off fentanyl patches

## 2012-08-17 NOTE — Telephone Encounter (Signed)
This is an ongoing issue for her and of unknown origin.

## 2012-08-18 DIAGNOSIS — I499 Cardiac arrhythmia, unspecified: Secondary | ICD-10-CM | POA: Diagnosis not present

## 2012-08-18 DIAGNOSIS — I509 Heart failure, unspecified: Secondary | ICD-10-CM | POA: Diagnosis not present

## 2012-08-18 DIAGNOSIS — Z9981 Dependence on supplemental oxygen: Secondary | ICD-10-CM | POA: Diagnosis not present

## 2012-08-18 DIAGNOSIS — L8993 Pressure ulcer of unspecified site, stage 3: Secondary | ICD-10-CM | POA: Diagnosis not present

## 2012-08-18 DIAGNOSIS — L89309 Pressure ulcer of unspecified buttock, unspecified stage: Secondary | ICD-10-CM | POA: Diagnosis not present

## 2012-08-18 DIAGNOSIS — I890 Lymphedema, not elsewhere classified: Secondary | ICD-10-CM | POA: Diagnosis not present

## 2012-08-18 DIAGNOSIS — E669 Obesity, unspecified: Secondary | ICD-10-CM | POA: Diagnosis not present

## 2012-08-18 DIAGNOSIS — I872 Venous insufficiency (chronic) (peripheral): Secondary | ICD-10-CM | POA: Diagnosis not present

## 2012-08-18 DIAGNOSIS — I10 Essential (primary) hypertension: Secondary | ICD-10-CM | POA: Diagnosis not present

## 2012-08-18 NOTE — Telephone Encounter (Signed)
Left message for patient to call office regarding patches falling off and things to can to do prevent this.

## 2012-08-18 NOTE — Telephone Encounter (Signed)
Patient has been putting tape on her patches and this seems to be helping.

## 2012-09-05 ENCOUNTER — Other Ambulatory Visit: Payer: Self-pay | Admitting: Internal Medicine

## 2012-09-06 ENCOUNTER — Encounter
Payer: Medicare Other | Attending: Physical Medicine and Rehabilitation | Admitting: Physical Medicine and Rehabilitation

## 2012-09-06 ENCOUNTER — Encounter: Payer: Self-pay | Admitting: Physical Medicine and Rehabilitation

## 2012-09-06 ENCOUNTER — Other Ambulatory Visit: Payer: Self-pay | Admitting: *Deleted

## 2012-09-06 VITALS — BP 127/55 | HR 90 | Resp 16 | Ht 70.0 in | Wt 280.0 lb

## 2012-09-06 DIAGNOSIS — M1710 Unilateral primary osteoarthritis, unspecified knee: Secondary | ICD-10-CM | POA: Diagnosis not present

## 2012-09-06 DIAGNOSIS — Z89511 Acquired absence of right leg below knee: Secondary | ICD-10-CM

## 2012-09-06 DIAGNOSIS — L03116 Cellulitis of left lower limb: Secondary | ICD-10-CM

## 2012-09-06 DIAGNOSIS — R296 Repeated falls: Secondary | ICD-10-CM | POA: Insufficient documentation

## 2012-09-06 DIAGNOSIS — G609 Hereditary and idiopathic neuropathy, unspecified: Secondary | ICD-10-CM | POA: Diagnosis not present

## 2012-09-06 DIAGNOSIS — L03119 Cellulitis of unspecified part of limb: Secondary | ICD-10-CM | POA: Diagnosis not present

## 2012-09-06 DIAGNOSIS — S88119A Complete traumatic amputation at level between knee and ankle, unspecified lower leg, initial encounter: Secondary | ICD-10-CM | POA: Diagnosis not present

## 2012-09-06 DIAGNOSIS — M1712 Unilateral primary osteoarthritis, left knee: Secondary | ICD-10-CM

## 2012-09-06 DIAGNOSIS — L02419 Cutaneous abscess of limb, unspecified: Secondary | ICD-10-CM | POA: Diagnosis not present

## 2012-09-06 DIAGNOSIS — M069 Rheumatoid arthritis, unspecified: Secondary | ICD-10-CM | POA: Diagnosis not present

## 2012-09-06 DIAGNOSIS — E119 Type 2 diabetes mellitus without complications: Secondary | ICD-10-CM | POA: Diagnosis not present

## 2012-09-06 MED ORDER — DULOXETINE HCL 60 MG PO CPEP: 120.0000 mg | ORAL_CAPSULE | Freq: Every day | ORAL | Status: DC

## 2012-09-06 MED ORDER — OXYCODONE-ACETAMINOPHEN 10-325 MG PO TABS: 1.0000 | ORAL_TABLET | ORAL | Status: DC | PRN

## 2012-09-06 MED ORDER — FENTANYL 75 MCG/HR TD PT72: 1.0000 | MEDICATED_PATCH | TRANSDERMAL | Status: DC

## 2012-09-06 MED ORDER — CYCLOBENZAPRINE HCL 10 MG PO TABS: 10.0000 mg | ORAL_TABLET | Freq: Three times a day (TID) | ORAL | Status: DC | PRN

## 2012-09-06 NOTE — Progress Notes (Signed)
Subjective:    Patient ID: Crystal Fitzpatrick, female    DOB: 1946-01-18, 67 y.o.   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 After lymph edema in 2010 she was not able to wear a prosthetic leg anymore. Since then she was in a wheelchair.She has restarted with PT and wearing her prosthetic leg, until she fell about 5 weeks ago.  Left patella fx (per pt) after fall at home 5 weeks ago, she saw Dr. Marva Panda, orthopedic surgeon in [place], he diagnosed her with a fissure in the left patella, and with some cysts, per patient, patient will call his office to send [place] the images and his notes.  The patient is not doing exercises she learned from PT at this point, she had cellulitis of her LLE, she was on Ax, and treated by the wound clinic, this has resolved.   Pain Inventory Average Pain 6 Pain Right Now 4 My pain is constant, sharp, burning, dull and aching  In the last 24 hours, has pain interfered with the following? General activity 5 Relation with others 3 Enjoyment of life 2 What TIME of day is your pain at its worst? morning and night Sleep (in general) Fair  Pain is worse with: inactivity and standing Pain improves with: rest, therapy/exercise and medication Relief from Meds: 7  Mobility Do you have any goals in this area?  yes  Function retired  Neuro/Psych No problems in this area  Prior Studies Any changes since last visit?  no  Physicians involved in your care Any changes since last visit?  no   Family History  Problem Relation Age of Onset  . Breast cancer Sister   . Diabetes Other   . Stroke Other     Grandparents   History   Social History  . Marital Status: Divorced    Spouse Name: N/A    Number of Children: 1  . Years of Education: N/A   Occupational History  . retired Marketing executive    Social History Main Topics  . Smoking status: Former Games developer  . Smokeless tobacco: Never Used  .  Alcohol Use: No  . Drug Use: No  . Sexually Active: None   Other Topics Concern  . None   Social History Narrative  . None   Past Surgical History  Procedure Laterality Date  . Amputation  02/02/2008    below right knee  . S/p egd  2007    with Botox for? Achalasia  . Tonsillectomy    . S/p breast biopsy  1992  . 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 Tubal ligation    . Nasal septum surgery    . Foot surgury  1980 and 1981   Past Medical History  Diagnosis Date  . DIABETES MELLITUS, TYPE II 07/17/2008  . B12 DEFICIENCY 07/17/2008  . Acute gouty arthropathy 12/11/2008  . HYPONATREMIA 03/20/2010  . HYPERKALEMIA 10/31/2009  . ANEMIA-NOS 07/17/2008  . ANXIETY 07/17/2008  . Chronic pain syndrome 02/14/2010  . Trigeminal neuralgia 02/14/2010  . HEARING LOSS, RIGHT EAR 06/04/2009  . HYPERTENSION 07/17/2008  . CHF 01/28/2009  . Pneumonia, organism unspecified 02/14/2010  . GERD 07/17/2008  . IBS 06/04/2009  . UNSPECIFIED HEPATITIS 01/28/2009  . RENAL INSUFFICIENCY 10/31/2009  . MENOPAUSAL DISORDER 12/03/2009  . Cellulitis and abscess of leg, except foot 11/15/2008  . SKIN ULCER, CHRONIC 02/08/2009  . Rheumatoid arthritis 07/17/2008  .  SKIN LESION 06/04/2009  . ARTHRITIS 01/28/2009  . FOOT PAIN 07/17/2008  . HYPERSOMNIA 02/14/2010  . Altered mental status 02/07/2010  . PERIPHERAL EDEMA 08/30/2008  . Wheezing 12/03/2009  . ABDOMINAL DISTENSION 10/11/2008  . Dysuria 11/11/2009  . Hypoxemia 03/20/2010  . ANAPHYLACTIC SHOCK 05/15/2009  . NEUROPATHY, HX OF 01/28/2009  . COLONIC POLYPS, HX OF 07/17/2008  . AMPUTATION, BELOW KNEE, RIGHT, HX OF 01/28/2009  . Hyperlipemia 09/24/2010  . Hyperlipidemia 09/24/2010  . Seizure 10/26/2010  . Pneumonia, aspiration 12/01/2011    Episode July 2013, tx per [facility]   BP 127/55  Pulse 90  Resp 16  Ht 5\' 10"  (1.778 m)  Wt 280 lb (127.007 kg)  BMI 40.18 kg/m2  SpO2 92%     Review of Systems  Musculoskeletal: Positive for myalgias.  All other  systems reviewed and are negative.       Objective:   Physical Exam Constitutional: She appears well-developed and well-nourished.  HENT:  Head: Normocephalic and atraumatic.  Nose: Nose normal.   Eyes: Conjunctivae are normal.  Neck: Normal range of motion. Neck supple.   Pulmonary/Chest: No respiratory distress. Patient in a wheel chair  Musculoskeletal:  Edema 2++LLE. Leg is dressed. Weight is stable but she remains morbidly obese.   Extension deficit of left knee about 15 degrees, flexion 110 degrees  Strength grossly intact, except right lower leg.          Assessment & Plan:  1. Right below-knee amputation, history of phantom limb pain.  2. Peripheral neuropathy.  3. History of rheumatoid arthritis.  4. Osteoarthritis of left knee-improved after synvisc  5. Chronic cellulitis, left leg with associated edema, treated by the wound clinic with Ax and rest.  6. Left patella fx (per pt) after fall at home 5 weeks ago, she saw Dr. Marva Panda, orthopedic surgeon in [place], he diagnosed her with a fissure in the left patella, and with some cysts, per patient, patient will call his office to send [place] the images and his notes.  PLAN:  1. Continue compression stocking to right leg. Advised patient to continue with the exercises she learned from Pt.  2. Continue wound care per the [place] wound [facility] clinic, her open wound has closed, I advised the patient to ask whether she could start with aquatic therapy soon.  3.Refilled her pain medications today including Percocet 10/325, #130  and fentanyl 75 mcg #10.  -still could consider pain psychology consult with Dr. Gennie Alma  -goal will be to taper meds  6.She will follow up with PA in one month.  Consider aquatic therapy, when wound care center approves.

## 2012-09-06 NOTE — Patient Instructions (Signed)
Continue with your yoga exercises, continue to exercise with your left knee without putting too much weight on it. Also ask the wound care center, whether you could start with aquatic therapy.

## 2012-09-06 NOTE — Telephone Encounter (Signed)
Flexeril and cymbalta refilled per fax request from pharmacy.

## 2012-10-04 ENCOUNTER — Encounter
Payer: Medicare Other | Attending: Physical Medicine and Rehabilitation | Admitting: Physical Medicine and Rehabilitation

## 2012-10-04 ENCOUNTER — Encounter: Payer: Self-pay | Admitting: Physical Medicine and Rehabilitation

## 2012-10-04 VITALS — BP 138/67 | HR 85 | Resp 16 | Ht 70.0 in

## 2012-10-04 DIAGNOSIS — M1710 Unilateral primary osteoarthritis, unspecified knee: Secondary | ICD-10-CM | POA: Insufficient documentation

## 2012-10-04 DIAGNOSIS — G609 Hereditary and idiopathic neuropathy, unspecified: Secondary | ICD-10-CM

## 2012-10-04 DIAGNOSIS — R609 Edema, unspecified: Secondary | ICD-10-CM | POA: Insufficient documentation

## 2012-10-04 DIAGNOSIS — M069 Rheumatoid arthritis, unspecified: Secondary | ICD-10-CM | POA: Diagnosis not present

## 2012-10-04 DIAGNOSIS — S88119A Complete traumatic amputation at level between knee and ankle, unspecified lower leg, initial encounter: Secondary | ICD-10-CM | POA: Diagnosis not present

## 2012-10-04 DIAGNOSIS — M8569 Other cyst of bone, multiple sites: Secondary | ICD-10-CM | POA: Insufficient documentation

## 2012-10-04 DIAGNOSIS — L03119 Cellulitis of unspecified part of limb: Secondary | ICD-10-CM | POA: Insufficient documentation

## 2012-10-04 DIAGNOSIS — G546 Phantom limb syndrome with pain: Secondary | ICD-10-CM

## 2012-10-04 DIAGNOSIS — Z89511 Acquired absence of right leg below knee: Secondary | ICD-10-CM

## 2012-10-04 DIAGNOSIS — L02419 Cutaneous abscess of limb, unspecified: Secondary | ICD-10-CM | POA: Diagnosis not present

## 2012-10-04 DIAGNOSIS — G547 Phantom limb syndrome without pain: Secondary | ICD-10-CM

## 2012-10-04 MED ORDER — FENTANYL 75 MCG/HR TD PT72: 1.0000 | MEDICATED_PATCH | TRANSDERMAL | Status: DC

## 2012-10-04 MED ORDER — OXYCODONE-ACETAMINOPHEN 10-325 MG PO TABS: 1.0000 | ORAL_TABLET | ORAL | Status: DC | PRN

## 2012-10-04 NOTE — Progress Notes (Signed)
Subjective:    Patient ID: Crystal Fitzpatrick, female    DOB: 1946-04-17, 67 y.o.   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 After lymph edema in 2010 she was not able to wear a prosthetic leg anymore. Since then she was in a wheelchair.   The patient is doing exercises she learned from PT, and she is doing some yoga.    Pain Inventory Average Pain 9 Pain Right Now 7 My pain is sharp, burning, stabbing and aching  In the last 24 hours, has pain interfered with the following? General activity na Relation with others na Enjoyment of life na What TIME of day is your pain at its worst? na Sleep (in general) na  Pain is worse with: na Pain improves with: na Relief from Meds: na  Mobility Do you have any goals in this area?  yes  Function Do you have any goals in this area?  no  Neuro/Psych No problems in this area  Prior Studies Any changes since last visit?  no  Physicians involved in your care Any changes since last visit?  no   Family History  Problem Relation Age of Onset  . Breast cancer Sister   . Diabetes Other   . Stroke Other     Grandparents   History   Social History  . Marital Status: Divorced    Spouse Name: N/A    Number of Children: 1  . Years of Education: N/A   Occupational History  . retired Marketing executive    Social History Main Topics  . Smoking status: Former Games developer  . Smokeless tobacco: Never Used  . Alcohol Use: No  . Drug Use: No  . Sexually Active: None   Other Topics Concern  . None   Social History Narrative  . None   Past Surgical History  Procedure Laterality Date  . Amputation  02/02/2008    below right knee  . S/p egd  2007    with Botox for? Achalasia  . Tonsillectomy    . S/p breast biopsy  1992  . 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 Tubal ligation    . Nasal septum surgery    . Foot surgury  1980 and 1981   Past Medical History   Diagnosis Date  . DIABETES MELLITUS, TYPE II 07/17/2008  . B12 DEFICIENCY 07/17/2008  . Acute gouty arthropathy 12/11/2008  . HYPONATREMIA 03/20/2010  . HYPERKALEMIA 10/31/2009  . ANEMIA-NOS 07/17/2008  . ANXIETY 07/17/2008  . Chronic pain syndrome 02/14/2010  . Trigeminal neuralgia 02/14/2010  . HEARING LOSS, RIGHT EAR 06/04/2009  . HYPERTENSION 07/17/2008  . CHF 01/28/2009  . Pneumonia, organism unspecified 02/14/2010  . GERD 07/17/2008  . IBS 06/04/2009  . UNSPECIFIED HEPATITIS 01/28/2009  . RENAL INSUFFICIENCY 10/31/2009  . MENOPAUSAL DISORDER 12/03/2009  . Cellulitis and abscess of leg, except foot 11/15/2008  . SKIN ULCER, CHRONIC 02/08/2009  . Rheumatoid arthritis 07/17/2008  . SKIN LESION 06/04/2009  . ARTHRITIS 01/28/2009  . FOOT PAIN 07/17/2008  . HYPERSOMNIA 02/14/2010  . Altered mental status 02/07/2010  . PERIPHERAL EDEMA 08/30/2008  . Wheezing 12/03/2009  . ABDOMINAL DISTENSION 10/11/2008  . Dysuria 11/11/2009  . Hypoxemia 03/20/2010  . ANAPHYLACTIC SHOCK 05/15/2009  . NEUROPATHY, HX OF 01/28/2009  . COLONIC POLYPS, HX OF 07/17/2008  . AMPUTATION, BELOW KNEE, RIGHT, HX OF 01/28/2009  . Hyperlipemia 09/24/2010  . Hyperlipidemia 09/24/2010  .  Seizure 10/26/2010  . Pneumonia, aspiration 12/01/2011    Episode July 2013, tx per [facility]   BP 138/67  Pulse 85  Resp 16  Ht 5\' 10"  (1.778 m)  SpO2 91%     Review of Systems  Cardiovascular: Positive for leg swelling.  Skin: Positive for rash.  All other systems reviewed and are negative.       Objective:   Physical Exam Constitutional: She appears well-developed and well-nourished.  HENT:  Head: Normocephalic and atraumatic.  Nose: Nose normal.  Eyes: Conjunctivae are normal.  Neck: Normal range of motion. Neck supple.  Pulmonary/Chest: No respiratory distress.  Patient in a wheel chair  Musculoskeletal:  Edema 2++LLE. Leg is dressed. Weight is stable but she remains morbidly obese.  Extension deficit of left knee about  15 degrees, flexion 110 degrees  Strength grossly intact, except right lower leg.         Assessment & Plan:  1. Right below-knee amputation, history of phantom limb pain.  2. Peripheral neuropathy.  3. History of rheumatoid arthritis.  4. Osteoarthritis of left knee-improved after synvisc  5. Chronic cellulitis, left leg with associated edema, treated by the wound clinic with Ax and rest.  6. Left patella fx (per pt) after fall at home 5 weeks ago, she saw Dr. Marva Panda, orthopedic surgeon in [place], he diagnosed her with a fissure in the left patella, and with some cysts, per patient, patient will call his office to send [place] the images and his notes.  PLAN:  1. Continue compression stocking to right leg. Advised patient to continue with the exercises she learned from Pt.  2. Continue wound care per the [place] wound [facility], her open wound has opened up again, unfortunately.  She is following up with wound care again.Consider aquatic therapy when wound has healed.   3.Refilled her pain medications today including Percocet 10/325, #130  and fentanyl 75 mcg #10.  -goal will be to taper meds  6.She will follow up with PA in one month.  Consider aquatic therapy, when wound care center approves.

## 2012-10-04 NOTE — Patient Instructions (Signed)
Try to stay as active as tolerated, follow up with wound care if necessary.

## 2012-10-07 ENCOUNTER — Other Ambulatory Visit: Payer: Self-pay | Admitting: Physical Medicine and Rehabilitation

## 2012-10-07 ENCOUNTER — Other Ambulatory Visit: Payer: Self-pay | Admitting: Internal Medicine

## 2012-10-11 ENCOUNTER — Encounter: Payer: Self-pay | Admitting: Internal Medicine

## 2012-10-11 ENCOUNTER — Ambulatory Visit (INDEPENDENT_AMBULATORY_CARE_PROVIDER_SITE_OTHER): Payer: Medicare Other | Admitting: Internal Medicine

## 2012-10-11 VITALS — BP 134/62 | HR 95 | Temp 98.0°F

## 2012-10-11 DIAGNOSIS — R42 Dizziness and giddiness: Secondary | ICD-10-CM | POA: Diagnosis not present

## 2012-10-11 MED ORDER — MECLIZINE HCL 25 MG PO TABS: 25.0000 mg | ORAL_TABLET | Freq: Three times a day (TID) | ORAL | Status: DC | PRN

## 2012-10-11 NOTE — Patient Instructions (Signed)

## 2012-10-11 NOTE — Progress Notes (Signed)
Subjective:    Patient ID: Crystal Fitzpatrick, female    DOB: Sep 08, 1945, 67 y.o.   MRN: [id number]  HPI  Pt presents to the clinic today with c/o dizziness. This started 3 days ago. It feels as if the room is spinning. She has not taken anything for this. She has had this in the past. She has taken Antivert in the past which worked well. It is better when she lays down. She denies chest pain, chest tightness or shortness of breath with these episodes.  Review of Systems      Past Medical History  Diagnosis Date  . DIABETES MELLITUS, TYPE II 07/17/2008  . B12 DEFICIENCY 07/17/2008  . Acute gouty arthropathy 12/11/2008  . HYPONATREMIA 03/20/2010  . HYPERKALEMIA 10/31/2009  . ANEMIA-NOS 07/17/2008  . ANXIETY 07/17/2008  . Chronic pain syndrome 02/14/2010  . Trigeminal neuralgia 02/14/2010  . HEARING LOSS, RIGHT EAR 06/04/2009  . HYPERTENSION 07/17/2008  . CHF 01/28/2009  . Pneumonia, organism unspecified 02/14/2010  . GERD 07/17/2008  . IBS 06/04/2009  . UNSPECIFIED HEPATITIS 01/28/2009  . RENAL INSUFFICIENCY 10/31/2009  . MENOPAUSAL DISORDER 12/03/2009  . Cellulitis and abscess of leg, except foot 11/15/2008  . SKIN ULCER, CHRONIC 02/08/2009  . Rheumatoid arthritis 07/17/2008  . SKIN LESION 06/04/2009  . ARTHRITIS 01/28/2009  . FOOT PAIN 07/17/2008  . HYPERSOMNIA 02/14/2010  . Altered mental status 02/07/2010  . PERIPHERAL EDEMA 08/30/2008  . Wheezing 12/03/2009  . ABDOMINAL DISTENSION 10/11/2008  . Dysuria 11/11/2009  . Hypoxemia 03/20/2010  . ANAPHYLACTIC SHOCK 05/15/2009  . NEUROPATHY, HX OF 01/28/2009  . COLONIC POLYPS, HX OF 07/17/2008  . AMPUTATION, BELOW KNEE, RIGHT, HX OF 01/28/2009  . Hyperlipemia 09/24/2010  . Hyperlipidemia 09/24/2010  . Seizure 10/26/2010  . Pneumonia, aspiration 12/01/2011    Episode July 2013, tx per [facility]    Current Outpatient Prescriptions  Medication Sig Dispense Refill  . albuterol (PROAIR HFA) 108 (90 BASE) MCG/ACT inhaler Inhale 2 puffs into the lungs every  6 (six) hours as needed.        . ALPRAZolam (XANAX) 0.25 MG tablet TAKE 1 TABLET BY MOUTH 3 TIMES DAILY    AS NEEDED  90 tablet  2  . Ascorbic Acid (VITAMIN C) 500 MG tablet Take 500 mg by mouth daily.        [name] atorvastatin (LIPITOR) 10 MG tablet Take 10 mg by mouth daily.      [name] BENICAR 40 MG tablet TAKE 1 TABLET BY MOUTH EVERY DAY FOR BLOOD PRESSURE  30 tablet  9  . Blood Glucose Monitoring Suppl (ONE TOUCH ULTRA 2) W/DEVICE KIT 1 Device by Does not apply route once.  1 each  0  . Blood Glucose Monitoring Suppl (ONE TOUCH ULTRA SYSTEM KIT) W/DEVICE KIT 1 kit by Does not apply route once.  1 each  0  . cephALEXin (KEFLEX) 500 MG capsule Take 1 capsule (500 mg total) by mouth 4 (four) times daily.  40 capsule  0  . Cholecalciferol (VITAMIN D) 400 UNITS capsule Take 400 Units by mouth daily.        . cyclobenzaprine (FLEXERIL) 10 MG tablet Take 1 tablet (10 mg total) by mouth every 8 (eight) hours as needed.  90 tablet  5  . diclofenac sodium (VOLTAREN) 1 % GEL Apply 2 g topically 4 (four) times daily.  3 Tube  3  . diphenoxylate-atropine (LOMOTIL) 2.5-0.025 MG per tablet TAKE ONE TABLET TWICE DAILY AS NEEDED  60  tablet  2  . DULoxetine (CYMBALTA) 60 MG capsule Take 2 capsules (120 mg total) by mouth daily.  60 capsule  5  . EPINEPHrine (EPIPEN 2-PAK) 0.3 mg/0.3 mL DEVI Inject 0.3 mLs (0.3 mg total) into the muscle once. Use as directed  1 Device  0  . fentaNYL (DURAGESIC - DOSED MCG/HR) 75 MCG/HR Place 1 patch (75 mcg total) onto the skin every 3 (three) days. 1 patch every 3 days  10 patch  0  . furosemide (LASIX) 20 MG tablet TAKE 1 TABLET BY MOUTH EVERY DAY  30 tablet  1  . gabapentin (NEURONTIN) 600 MG tablet Take 1 tablet (600 mg total) by mouth 3 (three) times daily.  90 tablet  3  . lidocaine (LIDODERM) 5 % Place 1 patch onto the skin. Remove & Discard patch within 12 hours or as directed by MD       . loperamide (ANTI-DIARRHEAL) 2 MG tablet Take 2 mg by mouth 2 (two) times daily as needed.         . Multiple Vitamins-Minerals (CENTRUM SILVER ULTRA WOMENS PO) Take by mouth daily.        . Omega-3 Fatty Acids (FISH OIL) 1000 MG CAPS Take by mouth daily.        Letta Pate DELICA LANCETS MISC Use as directed to test blood sugar dx 250.00  100 each  2  . oxyCODONE-acetaminophen (PERCOCET) 10-325 MG per tablet Take 1 tablet by mouth every 4 (four) hours as needed for pain.  130 tablet  0  . pantoprazole (PROTONIX) 40 MG tablet TAKE ONE (1) TABLET BY MOUTH EVERY DAY  FOR ACID REFLUX  30 tablet  11  . promethazine (PHENERGAN) 25 MG tablet TAKE 1 TABLET BY MOUTH EVERY 6 HOURS    AS NEEDED FOR NAUSEA  60 tablet  1  . Psyllium (METAMUCIL) 30.9 % POWD Take by mouth. Take as directed       . topiramate (TOPAMAX) 100 MG tablet TAKE 3 TABLETS BY MOUTH EACH NIGHT AT BEDTIME  90 tablet  3   No current facility-administered medications for this visit.    Allergies  Allergen Reactions  . Morphine Anaphylaxis  . Sulfonamide Derivatives Diarrhea  . Ciprofloxacin Diarrhea  . Doxycycline     Nausea, diarrhea  . Nitrofurantoin     REACTION: itch  . Prednisone     Fast heart rate  . Erythromycin Rash    Family History  Problem Relation Age of Onset  . Breast cancer Sister   . Diabetes Other   . Stroke Other     Grandparents    History   Social History  . Marital Status: Divorced    Spouse Name: N/A    Number of Children: 1  . Years of Education: N/A   Occupational History  . retired Marketing executive    Social History Main Topics  . Smoking status: Former Games developer  . Smokeless tobacco: Never Used  . Alcohol Use: No  . Drug Use: No  . Sexually Active: Not on file   Other Topics Concern  . Not on file   Social History Narrative  . No narrative on file     Constitutional: Denies fever, malaise, fatigue, headache or abrupt weight changes.  HEENT: Denies eye pain, eye redness, ear pain, ringing in the ears, wax buildup, runny nose, nasal congestion, bloody  nose, or sore throat. Respiratory: Denies difficulty breathing, shortness of breath, cough or sputum production.   Cardiovascular: Denies  chest pain, chest tightness, palpitations or swelling in the hands or feet.  Neurological: Pt reports dizizness. Denies difficulty with memory, difficulty with speech or problems with balance and coordination.   No other specific complaints in a complete review of systems (except as listed in HPI above).   Objective:   Physical Exam   BP 134/62  Pulse 95  Temp(Src) 98 F (36.7 C) (Oral)  SpO2 95% Wt Readings from Last 3 Encounters:  09/06/12 280 lb (127.007 kg)  08/05/12 280 lb (127.007 kg)  07/11/12 280 lb (127.007 kg)    General: Appears her stated age, well developed, well nourished in NAD. HEENT: Head: normal shape and size; Eyes: sclera white, no icterus, conjunctiva pink, PERRLA and EOMs intact; Ears: Tm's gray and intact, normal light reflex; Nose: mucosa pink and moist, septum midline; Throat/Mouth: Teeth present, mucosa pink and moist, no exudate, lesions or ulcerations noted.  Cardiovascular: Normal rate and rhythm. S1,S2 noted.  No murmur, rubs or gallops noted. No JVD or BLE edema. No carotid bruits noted. Pulmonary/Chest: Normal effort and positive vesicular breath sounds. No respiratory distress. No wheezes, rales or ronchi noted.  Musculoskeletal: Normal range of motion. No signs of joint swelling. No difficulty with gait.  Neurological: Alert and oriented. Cranial nerves II-XII intact. Coordination normal. +DTRs bilaterally.         Assessment & Plan:   Dizziness, likely due to transient vertigo, recurret:  eRx for Meclizine 25 mg TID prn Change positions slowly  RTC as needed or if symptoms persist or worsen

## 2012-11-03 ENCOUNTER — Encounter
Payer: Medicare Other | Attending: Physical Medicine and Rehabilitation | Admitting: Physical Medicine and Rehabilitation

## 2012-11-03 ENCOUNTER — Encounter: Payer: Self-pay | Admitting: Physical Medicine and Rehabilitation

## 2012-11-03 VITALS — BP 126/58 | HR 100 | Resp 16 | Ht 70.0 in

## 2012-11-03 DIAGNOSIS — Z79899 Other long term (current) drug therapy: Secondary | ICD-10-CM | POA: Diagnosis not present

## 2012-11-03 DIAGNOSIS — G609 Hereditary and idiopathic neuropathy, unspecified: Secondary | ICD-10-CM | POA: Insufficient documentation

## 2012-11-03 DIAGNOSIS — E119 Type 2 diabetes mellitus without complications: Secondary | ICD-10-CM | POA: Diagnosis not present

## 2012-11-03 DIAGNOSIS — M1710 Unilateral primary osteoarthritis, unspecified knee: Secondary | ICD-10-CM | POA: Diagnosis not present

## 2012-11-03 DIAGNOSIS — G547 Phantom limb syndrome without pain: Secondary | ICD-10-CM | POA: Diagnosis not present

## 2012-11-03 DIAGNOSIS — E785 Hyperlipidemia, unspecified: Secondary | ICD-10-CM | POA: Insufficient documentation

## 2012-11-03 DIAGNOSIS — M069 Rheumatoid arthritis, unspecified: Secondary | ICD-10-CM | POA: Insufficient documentation

## 2012-11-03 DIAGNOSIS — M47817 Spondylosis without myelopathy or radiculopathy, lumbosacral region: Secondary | ICD-10-CM | POA: Diagnosis not present

## 2012-11-03 DIAGNOSIS — K219 Gastro-esophageal reflux disease without esophagitis: Secondary | ICD-10-CM | POA: Insufficient documentation

## 2012-11-03 DIAGNOSIS — S88119A Complete traumatic amputation at level between knee and ankle, unspecified lower leg, initial encounter: Secondary | ICD-10-CM | POA: Diagnosis not present

## 2012-11-03 DIAGNOSIS — I10 Essential (primary) hypertension: Secondary | ICD-10-CM | POA: Insufficient documentation

## 2012-11-03 DIAGNOSIS — Z5181 Encounter for therapeutic drug level monitoring: Secondary | ICD-10-CM | POA: Diagnosis not present

## 2012-11-03 DIAGNOSIS — L02419 Cutaneous abscess of limb, unspecified: Secondary | ICD-10-CM | POA: Insufficient documentation

## 2012-11-03 MED ORDER — OXYCODONE-ACETAMINOPHEN 10-325 MG PO TABS: 1.0000 | ORAL_TABLET | ORAL | Status: DC | PRN

## 2012-11-03 MED ORDER — FENTANYL 75 MCG/HR TD PT72: 1.0000 | MEDICATED_PATCH | TRANSDERMAL | Status: DC

## 2012-11-03 NOTE — Addendum Note (Signed)
Addended by: Doreene Eland on: 11/03/2012 12:32 PM   Modules accepted: Orders

## 2012-11-03 NOTE — Progress Notes (Signed)
Subjective:    Patient ID: Crystal Fitzpatrick, female    DOB: February 13, 1946, 67 y.o.   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 After lymph edema in 2010 she was not able to wear a prosthetic leg anymore. Since then she was in a wheelchair.  The patient is doing exercises she learned from PT, and she is doing some yoga.  She states, that she will follow up with the wound care center next week, for her small open wound on her left hip.  Pain Inventory Average Pain 6 Pain Right Now 6 My pain is sharp and burning  In the last 24 hours, has pain interfered with the following? General activity 6 Relation with others 4 Enjoyment of life 4 What TIME of day is your pain at its worst? morning, night time Sleep (in general) Poor  Pain is worse with: sitting Pain improves with: heat/ice, therapy/exercise and medication Relief from Meds: 7  Mobility ability to climb steps?  no do you drive?  no use a wheelchair transfers alone Do you have any goals in this area?  yes  Function Do you have any goals in this area?  no  Neuro/Psych No problems in this area  Prior Studies Any changes since last visit?  no  Physicians involved in your care Any changes since last visit?  no   Family History  Problem Relation Age of Onset  . Breast cancer Sister   . Diabetes Other   . Stroke Other     Grandparents   History   Social History  . Marital Status: Divorced    Spouse Name: N/A    Number of Children: 1  . Years of Education: N/A   Occupational History  . retired Marketing executive    Social History Main Topics  . Smoking status: Former Games developer  . Smokeless tobacco: Never Used  . Alcohol Use: No  . Drug Use: No  . Sexually Active: None   Other Topics Concern  . None   Social History Narrative  . None   Past Surgical History  Procedure Laterality Date  . Amputation  02/02/2008    below right knee  . S/p egd   2007    with Botox for? Achalasia  . Tonsillectomy    . S/p breast biopsy  1992  . 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 Tubal ligation    . Nasal septum surgery    . Foot surgury  1980 and 1981   Past Medical History  Diagnosis Date  . DIABETES MELLITUS, TYPE II 07/17/2008  . B12 DEFICIENCY 07/17/2008  . Acute gouty arthropathy 12/11/2008  . HYPONATREMIA 03/20/2010  . HYPERKALEMIA 10/31/2009  . ANEMIA-NOS 07/17/2008  . ANXIETY 07/17/2008  . Chronic pain syndrome 02/14/2010  . Trigeminal neuralgia 02/14/2010  . HEARING LOSS, RIGHT EAR 06/04/2009  . HYPERTENSION 07/17/2008  . CHF 01/28/2009  . Pneumonia, organism unspecified 02/14/2010  . GERD 07/17/2008  . IBS 06/04/2009  . UNSPECIFIED HEPATITIS 01/28/2009  . RENAL INSUFFICIENCY 10/31/2009  . MENOPAUSAL DISORDER 12/03/2009  . Cellulitis and abscess of leg, except foot 11/15/2008  . SKIN ULCER, CHRONIC 02/08/2009  . Rheumatoid arthritis(714.0) 07/17/2008  . SKIN LESION 06/04/2009  . ARTHRITIS 01/28/2009  . FOOT PAIN 07/17/2008  . HYPERSOMNIA 02/14/2010  . Altered mental status 02/07/2010  . PERIPHERAL EDEMA 08/30/2008  . Wheezing 12/03/2009  . ABDOMINAL DISTENSION 10/11/2008  . Dysuria  11/11/2009  . Hypoxemia 03/20/2010  . ANAPHYLACTIC SHOCK 05/15/2009  . NEUROPATHY, HX OF 01/28/2009  . COLONIC POLYPS, HX OF 07/17/2008  . AMPUTATION, BELOW KNEE, RIGHT, HX OF 01/28/2009  . Hyperlipemia 09/24/2010  . Hyperlipidemia 09/24/2010  . Seizure 10/26/2010  . Pneumonia, aspiration 12/01/2011    Episode July 2013, tx per [facility]   BP 126/58  Pulse 100  Resp 16  Ht 5\' 10"  (1.778 m)  SpO2 92%     Review of Systems  All other systems reviewed and are negative.       Objective:   Physical Exam Constitutional: She appears well-developed and well-nourished.  HENT:  Head: Normocephalic and atraumatic.  Nose: Nose normal.  Eyes: Conjunctivae are normal.  Neck: Normal range of motion. Neck supple.  Pulmonary/Chest: No respiratory  distress.  Patient in a wheel chair  Skin : 1-2 cm open sore on her left hip around the trochanter, no redness around it , or draining of wound Musculoskeletal:  Edema 2++LLE. Leg is dressed. Weight is stable but she remains morbidly obese.  Extension deficit of left knee about 15 degrees, flexion 110 degrees  Strength grossly intact, except right lower leg.         Assessment & Plan:  1. Right below-knee amputation, history of phantom limb pain.  2. Peripheral neuropathy.  3. History of rheumatoid arthritis.  4. Osteoarthritis of left knee-improved after synvisc  5. Chronic cellulitis, left leg with associated edema, treated by the wound clinic with Ax and rest.  6. Left patella fx (per pt) after fall at home 5 weeks ago, she saw Dr. Marva Panda, orthopedic surgeon in [place], he diagnosed her with a fissure in the left patella, and with some cysts, per patient, patient will call his office to send [place] the images and his notes.  PLAN:  1. Continue compression stocking to right leg. Advised patient to continue with the exercises she learned from Pt.  2. Continue wound care per the [place] wound care clinic, her open wound has opened up again, unfortunately. She is following up with wound care again.Consider aquatic therapy when wound has healed.  3.Refilled her pain medications today including Percocet 10/325, #130  and fentanyl 75 mcg #10.  -goal will be to taper meds  6.She will follow up with PA in one month.  Consider aquatic therapy, when wound care center approves.

## 2012-11-03 NOTE — Patient Instructions (Signed)
Continue with your exercise program regularly

## 2012-11-10 DIAGNOSIS — J45909 Unspecified asthma, uncomplicated: Secondary | ICD-10-CM | POA: Diagnosis not present

## 2012-11-10 DIAGNOSIS — I509 Heart failure, unspecified: Secondary | ICD-10-CM | POA: Diagnosis not present

## 2012-11-10 DIAGNOSIS — I809 Phlebitis and thrombophlebitis of unspecified site: Secondary | ICD-10-CM | POA: Diagnosis not present

## 2012-11-10 DIAGNOSIS — Z9981 Dependence on supplemental oxygen: Secondary | ICD-10-CM | POA: Diagnosis not present

## 2012-11-10 DIAGNOSIS — E669 Obesity, unspecified: Secondary | ICD-10-CM | POA: Diagnosis not present

## 2012-11-10 DIAGNOSIS — E119 Type 2 diabetes mellitus without complications: Secondary | ICD-10-CM | POA: Diagnosis not present

## 2012-11-10 DIAGNOSIS — I499 Cardiac arrhythmia, unspecified: Secondary | ICD-10-CM | POA: Diagnosis not present

## 2012-11-10 DIAGNOSIS — I10 Essential (primary) hypertension: Secondary | ICD-10-CM | POA: Diagnosis not present

## 2012-11-10 DIAGNOSIS — L97109 Non-pressure chronic ulcer of unspecified thigh with unspecified severity: Secondary | ICD-10-CM | POA: Diagnosis not present

## 2012-12-01 ENCOUNTER — Encounter
Payer: Medicare Other | Attending: Physical Medicine and Rehabilitation | Admitting: Physical Medicine and Rehabilitation

## 2012-12-01 ENCOUNTER — Encounter: Payer: Self-pay | Admitting: Physical Medicine and Rehabilitation

## 2012-12-01 VITALS — BP 139/53 | HR 89 | Resp 14 | Ht 70.0 in | Wt 280.0 lb

## 2012-12-01 DIAGNOSIS — S82009A Unspecified fracture of unspecified patella, initial encounter for closed fracture: Secondary | ICD-10-CM | POA: Insufficient documentation

## 2012-12-01 DIAGNOSIS — G546 Phantom limb syndrome with pain: Secondary | ICD-10-CM

## 2012-12-01 DIAGNOSIS — G609 Hereditary and idiopathic neuropathy, unspecified: Secondary | ICD-10-CM | POA: Insufficient documentation

## 2012-12-01 DIAGNOSIS — M47817 Spondylosis without myelopathy or radiculopathy, lumbosacral region: Secondary | ICD-10-CM

## 2012-12-01 DIAGNOSIS — G547 Phantom limb syndrome without pain: Secondary | ICD-10-CM | POA: Insufficient documentation

## 2012-12-01 DIAGNOSIS — M069 Rheumatoid arthritis, unspecified: Secondary | ICD-10-CM | POA: Diagnosis not present

## 2012-12-01 DIAGNOSIS — S88119A Complete traumatic amputation at level between knee and ankle, unspecified lower leg, initial encounter: Secondary | ICD-10-CM | POA: Insufficient documentation

## 2012-12-01 DIAGNOSIS — M1710 Unilateral primary osteoarthritis, unspecified knee: Secondary | ICD-10-CM | POA: Insufficient documentation

## 2012-12-01 DIAGNOSIS — R296 Repeated falls: Secondary | ICD-10-CM | POA: Insufficient documentation

## 2012-12-01 DIAGNOSIS — Z89511 Acquired absence of right leg below knee: Secondary | ICD-10-CM

## 2012-12-01 MED ORDER — FENTANYL 75 MCG/HR TD PT72: 1.0000 | MEDICATED_PATCH | TRANSDERMAL | Status: DC

## 2012-12-01 MED ORDER — OXYCODONE-ACETAMINOPHEN 10-325 MG PO TABS: 1.0000 | ORAL_TABLET | ORAL | Status: DC | PRN

## 2012-12-01 NOTE — Progress Notes (Signed)
Subjective:    Patient ID: Crystal Fitzpatrick, female    DOB: [birth year], 67 y.o.   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 After lymph edema in 2010 she was not able to wear a prosthetic leg anymore. Since then she was in a wheelchair.  The patient is doing exercises she learned from PT, and she is doing some yoga.  She states, that she is following up with the wound care center, for her small open wound on her left hip.   Pain Inventory Average Pain 7 Pain Right Now 6 My pain is constant  In the last 24 hours, has pain interfered with the following? General activity 7 Relation with others 3 Enjoyment of life 1 What TIME of day is your pain at its worst? morning an night Sleep (in general) Fair  Pain is worse with: sitting, inactivity, standing and some activites Pain improves with: therapy/exercise and medication Relief from Meds: 7  Mobility use a wheelchair needs help with transfers  Function disabled: date disabled . I need assistance with the following:  household duties and shopping  Neuro/Psych numbness tingling  Prior Studies Any changes since last visit?  no  Physicians involved in your care Any changes since last visit?  no   Family History  Problem Relation Age of Onset  . Breast cancer Sister   . Diabetes Other   . Stroke Other     Grandparents   History   Social History  . Marital Status: Divorced    Spouse Name: N/A    Number of Children: 1  . Years of Education: N/A   Occupational History  . retired Marketing executive    Social History Main Topics  . Smoking status: Former Games developer  . Smokeless tobacco: Never Used  . Alcohol Use: No  . Drug Use: No  . Sexually Active: None   Other Topics Concern  . None   Social History Narrative  . None   Past Surgical History  Procedure Laterality Date  . Amputation  02/02/2008    below right knee  . S/p egd  2007    with  Botox for? Achalasia  . Tonsillectomy    . S/p breast biopsy  1992  . 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 Tubal ligation    . Nasal septum surgery    . Foot surgury  1980 and 1981   Past Medical History  Diagnosis Date  . DIABETES MELLITUS, TYPE II 07/17/2008  . B12 DEFICIENCY 07/17/2008  . Acute gouty arthropathy 12/11/2008  . HYPONATREMIA 03/20/2010  . HYPERKALEMIA 10/31/2009  . ANEMIA-NOS 07/17/2008  . ANXIETY 07/17/2008  . Chronic pain syndrome 02/14/2010  . Trigeminal neuralgia 02/14/2010  . HEARING LOSS, RIGHT EAR 06/04/2009  . HYPERTENSION 07/17/2008  . CHF 01/28/2009  . Pneumonia, organism unspecified 02/14/2010  . GERD 07/17/2008  . IBS 06/04/2009  . UNSPECIFIED HEPATITIS 01/28/2009  . RENAL INSUFFICIENCY 10/31/2009  . MENOPAUSAL DISORDER 12/03/2009  . Cellulitis and abscess of leg, except foot 11/15/2008  . SKIN ULCER, CHRONIC 02/08/2009  . Rheumatoid arthritis(714.0) 07/17/2008  . SKIN LESION 06/04/2009  . ARTHRITIS 01/28/2009  . FOOT PAIN 07/17/2008  . HYPERSOMNIA 02/14/2010  . Altered mental status 02/07/2010  . PERIPHERAL EDEMA 08/30/2008  . Wheezing 12/03/2009  . ABDOMINAL DISTENSION 10/11/2008  . Dysuria 11/11/2009  . Hypoxemia 03/20/2010  . ANAPHYLACTIC SHOCK 05/15/2009  . NEUROPATHY, HX OF 01/28/2009  .  COLONIC POLYPS, HX OF 07/17/2008  . AMPUTATION, BELOW KNEE, RIGHT, HX OF 01/28/2009  . Hyperlipemia 09/24/2010  . Hyperlipidemia 09/24/2010  . Seizure 10/26/2010  . Pneumonia, aspiration 12/01/2011    Episode July 2013, tx per [facility]   BP 139/53  Pulse 89  Resp 14  Ht 5\' 10"  (1.778 m)  Wt 280 lb (127.007 kg)  BMI 40.18 kg/m2  SpO2 91%   Review of Systems  Skin: Positive for wound.       Buttocks wound  Neurological: Positive for numbness.       Tingling  All other systems reviewed and are negative.       Objective:   Physical Exam Constitutional: She appears well-developed and well-nourished.  HENT:  Head: Normocephalic and atraumatic.  Nose:  Nose normal.  Eyes: Conjunctivae are normal.  Neck: Normal range of motion. Neck supple.  Pulmonary/Chest: No respiratory distress.  Patient in a wheel chair  Skin : 1-2 cm open sore on her left hip around the trochanter, Musculoskeletal:   Weight is stable but she remains morbidly obese.  Extension deficit of left knee about 15 degrees, flexion 110 degrees  Strength grossly intact, except right lower leg.        Assessment & Plan:  1. Right below-knee amputation, history of phantom limb pain.  2. Peripheral neuropathy.  3. History of rheumatoid arthritis.  4. Osteoarthritis of left knee-improved after synvisc  5. Chronic cellulitis, left leg with associated edema, treated by the wound clinic with Ax and rest.  6. Left patella fx (per pt) after fall at home 5 weeks ago, she saw Dr. Marva Panda, orthopedic surgeon in [place], he diagnosed her with a fissure in the left patella, and with some cysts, per patient, patient will call his office to send [place] the images and his notes.  PLAN:  1. Continue compression stocking to right leg. Advised patient to continue with the exercises she learned from Pt.  2. Continue wound care per the [place] wound care clinic, her open wound has opened up again, unfortunately. She is following up with wound care again.Consider aquatic therapy when wound has healed.  3.Refilled her pain medications today including Percocet 10/325, #130  and fentanyl 75 mcg #10.  -goal will be to taper meds  6.She will follow up with PA in one month.  Consider aquatic therapy, when wound care center approves.

## 2012-12-01 NOTE — Patient Instructions (Signed)
Follow up with the wound care center

## 2012-12-12 ENCOUNTER — Other Ambulatory Visit (INDEPENDENT_AMBULATORY_CARE_PROVIDER_SITE_OTHER): Payer: Medicare Other

## 2012-12-12 ENCOUNTER — Ambulatory Visit (INDEPENDENT_AMBULATORY_CARE_PROVIDER_SITE_OTHER): Payer: Medicare Other | Admitting: Internal Medicine

## 2012-12-12 ENCOUNTER — Encounter: Payer: Self-pay | Admitting: Internal Medicine

## 2012-12-12 VITALS — BP 142/78 | HR 75 | Temp 99.2°F

## 2012-12-12 DIAGNOSIS — I10 Essential (primary) hypertension: Secondary | ICD-10-CM

## 2012-12-12 DIAGNOSIS — H6690 Otitis media, unspecified, unspecified ear: Secondary | ICD-10-CM

## 2012-12-12 DIAGNOSIS — F411 Generalized anxiety disorder: Secondary | ICD-10-CM

## 2012-12-12 DIAGNOSIS — H6691 Otitis media, unspecified, right ear: Secondary | ICD-10-CM | POA: Insufficient documentation

## 2012-12-12 DIAGNOSIS — E119 Type 2 diabetes mellitus without complications: Secondary | ICD-10-CM

## 2012-12-12 LAB — LIPID PANEL
Cholesterol: 212 mg/dL — ABNORMAL HIGH (ref 0–200)
HDL: 36.7 mg/dL — ABNORMAL LOW (ref >39.00)
Total CHOL/HDL Ratio: 6
Triglycerides: 272 mg/dL — ABNORMAL HIGH (ref 0.0–149.0)
VLDL: 54.4 mg/dL — ABNORMAL HIGH (ref 0.0–40.0)

## 2012-12-12 LAB — HEPATIC FUNCTION PANEL
Alkaline Phosphatase: 65 U/L (ref 39–117)
Bilirubin, Direct: 0 mg/dL (ref 0.0–0.3)
Total Protein: 7.1 g/dL (ref 6.0–8.3)

## 2012-12-12 LAB — BASIC METABOLIC PANEL
Calcium: 8.9 mg/dL (ref 8.4–10.5)
Creatinine, Ser: 1.2 mg/dL (ref 0.4–1.2)
GFR: 49.74 mL/min — ABNORMAL LOW (ref >60.00)
Sodium: 138 mEq/L (ref 135–145)

## 2012-12-12 MED ORDER — ALPRAZOLAM 0.25 MG PO TABS: ORAL_TABLET | ORAL | Status: DC

## 2012-12-12 MED ORDER — CEPHALEXIN 500 MG PO CAPS: 500.0000 mg | ORAL_CAPSULE | Freq: Four times a day (QID) | ORAL | Status: DC

## 2012-12-12 NOTE — Patient Instructions (Signed)
Please take all new medication as prescribed- the antibiotic Please continue all other medications as before, and refills have been done if requested- the xanax Please have the pharmacy call with any other refills you may need.  Please go to the LAB in the Basement (turn left off the elevator) for the tests to be done today You will be contacted by phone if any changes need to be made immediately.  Otherwise, you will receive a letter about your results with an explanation, but please check with MyChart first.  Please remember to sign up for My Chart if you have not done so, as this will be important to you in the future with finding out test results, communicating by private email, and scheduling acute appointments online when needed.  Please return in 6 months, or sooner if needed

## 2012-12-12 NOTE — Progress Notes (Signed)
Subjective:    Patient ID: Crystal Fitzpatrick, female    DOB: Feb 21, 1946, 67 y.o.   MRN: [id number]  HPI  Here to f/u; overall doing ok,  Pt denies chest pain, increased sob or doe, wheezing, orthopnea, PND, increased LE swelling, palpitations, dizziness or syncope.  Pt denies polydipsia, polyuria, or low sugar symptoms such as weakness or confusion improved with po intake.  Pt denies new neurological symptoms such as new headache, or facial or extremity weakness or numbness.   Pt states overall good compliance with meds, has been trying to follow lower cholesterol, diabetic diet, with wt overall stable,  but little exercise however. Admits to some dietary noncomplaicne, not able to do wt today - ? Has gained several lbs. Denies worsening depressive symptoms, suicidal ideation, or panic; has ongoing anxiety, needs med refill. Incidnetly with 3 days right ear pain, mild feverish, feeling bad, Past Medical History  Diagnosis Date  . DIABETES MELLITUS, TYPE II 07/17/2008  . B12 DEFICIENCY 07/17/2008  . Acute gouty arthropathy 12/11/2008  . HYPONATREMIA 03/20/2010  . HYPERKALEMIA 10/31/2009  . ANEMIA-NOS 07/17/2008  . ANXIETY 07/17/2008  . Chronic pain syndrome 02/14/2010  . Trigeminal neuralgia 02/14/2010  . HEARING LOSS, RIGHT EAR 06/04/2009  . HYPERTENSION 07/17/2008  . CHF 01/28/2009  . Pneumonia, organism unspecified 02/14/2010  . GERD 07/17/2008  . IBS 06/04/2009  . UNSPECIFIED HEPATITIS 01/28/2009  . RENAL INSUFFICIENCY 10/31/2009  . MENOPAUSAL DISORDER 12/03/2009  . Cellulitis and abscess of leg, except foot 11/15/2008  . SKIN ULCER, CHRONIC 02/08/2009  . Rheumatoid arthritis(714.0) 07/17/2008  . SKIN LESION 06/04/2009  . ARTHRITIS 01/28/2009  . FOOT PAIN 07/17/2008  . HYPERSOMNIA 02/14/2010  . Altered mental status 02/07/2010  . PERIPHERAL EDEMA 08/30/2008  . Wheezing 12/03/2009  . ABDOMINAL DISTENSION 10/11/2008  . Dysuria 11/11/2009  . Hypoxemia 03/20/2010  . ANAPHYLACTIC SHOCK 05/15/2009  . NEUROPATHY, HX OF  01/28/2009  . COLONIC POLYPS, HX OF 07/17/2008  . AMPUTATION, BELOW KNEE, RIGHT, HX OF 01/28/2009  . Hyperlipemia 09/24/2010  . Hyperlipidemia 09/24/2010  . Seizure 10/26/2010  . Pneumonia, aspiration 12/01/2011    Episode July 2013, tx per [facility]   Past Surgical History  Procedure Laterality Date  . Amputation  02/02/2008    below right knee  . S/p egd  2007    with Botox for? Achalasia  . Tonsillectomy    . S/p breast biopsy  1992  . 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 Tubal ligation    . Nasal septum surgery    . Foot surgury  1980 and 1981    reports that she has quit smoking. She has never used smokeless tobacco. She reports that she does not drink alcohol or use illicit drugs. family history includes Breast cancer in her sister; Diabetes in her other; and Stroke in her other. Allergies  Allergen Reactions  . Morphine Anaphylaxis  . Sulfonamide Derivatives Diarrhea  . Ciprofloxacin Diarrhea  . Doxycycline     Nausea, diarrhea  . Nitrofurantoin     REACTION: itch  . Prednisone     Fast heart rate  . Erythromycin Rash   Current Outpatient Prescriptions on File Prior to Visit  Medication Sig Dispense Refill  . albuterol (PROAIR HFA) 108 (90 BASE) MCG/ACT inhaler Inhale 2 puffs into the lungs every 6 (six) hours as needed.        . Ascorbic Acid (VITAMIN C) 500 MG tablet Take 500 mg by  mouth daily.        [name] BENICAR 40 MG tablet TAKE 1 TABLET BY MOUTH EVERY DAY FOR BLOOD PRESSURE  30 tablet  9  . Blood Glucose Monitoring Suppl (ONE TOUCH ULTRA 2) W/DEVICE KIT 1 Device by Does not apply route once.  1 each  0  . Blood Glucose Monitoring Suppl (ONE TOUCH ULTRA SYSTEM KIT) W/DEVICE KIT 1 kit by Does not apply route once.  1 each  0  . Cholecalciferol (VITAMIN D) 400 UNITS capsule Take 400 Units by mouth daily.        . cyclobenzaprine (FLEXERIL) 10 MG tablet Take 1 tablet (10 mg total) by mouth every 8 (eight) hours as needed.  90 tablet  5  .  diclofenac sodium (VOLTAREN) 1 % GEL Apply 2 g topically 4 (four) times daily.  3 Tube  3  . diphenoxylate-atropine (LOMOTIL) 2.5-0.025 MG per tablet TAKE ONE TABLET TWICE DAILY AS NEEDED  60 tablet  2  . DULoxetine (CYMBALTA) 60 MG capsule Take 2 capsules (120 mg total) by mouth daily.  60 capsule  5  . EPINEPHrine (EPIPEN 2-PAK) 0.3 mg/0.3 mL DEVI Inject 0.3 mLs (0.3 mg total) into the muscle once. Use as directed  1 Device  0  . fentaNYL (DURAGESIC - DOSED MCG/HR) 75 MCG/HR Place 1 patch (75 mcg total) onto the skin every 3 (three) days. 1 patch every 3 days  10 patch  0  . furosemide (LASIX) 20 MG tablet TAKE 1 TABLET BY MOUTH EVERY DAY  30 tablet  1  . gabapentin (NEURONTIN) 600 MG tablet Take 1 tablet (600 mg total) by mouth 3 (three) times daily.  90 tablet  3  . lidocaine (LIDODERM) 5 % Place 1 patch onto the skin. Remove & Discard patch within 12 hours or as directed by MD       . loperamide (ANTI-DIARRHEAL) 2 MG tablet Take 2 mg by mouth 2 (two) times daily as needed.        . meclizine (ANTIVERT) 25 MG tablet Take 1 tablet (25 mg total) by mouth 3 (three) times daily as needed.  30 tablet  0  . Multiple Vitamins-Minerals (CENTRUM SILVER ULTRA WOMENS PO) Take by mouth daily.        . Omega-3 Fatty Acids (FISH OIL) 1000 MG CAPS Take by mouth daily.        Letta Pate DELICA LANCETS MISC Use as directed to test blood sugar dx 250.00  100 each  2  . oxyCODONE-acetaminophen (PERCOCET) 10-325 MG per tablet Take 1 tablet by mouth every 4 (four) hours as needed for pain.  130 tablet  0  . pantoprazole (PROTONIX) 40 MG tablet TAKE ONE (1) TABLET BY MOUTH EVERY DAY  FOR ACID REFLUX  30 tablet  11  . promethazine (PHENERGAN) 25 MG tablet TAKE 1 TABLET BY MOUTH EVERY 6 HOURS    AS NEEDED FOR NAUSEA  60 tablet  1  . Psyllium (METAMUCIL) 30.9 % POWD Take by mouth. Take as directed       . topiramate (TOPAMAX) 100 MG tablet TAKE 3 TABLETS BY MOUTH EACH NIGHT AT BEDTIME  90 tablet  3   No current  facility-administered medications on file prior to visit.   Review of Systems  Constitutional: Negative for unexpected weight change, or unusual diaphoresis  HENT: Negative for tinnitus.   Eyes: Negative for photophobia and visual disturbance.  Respiratory: Negative for choking and stridor.   Gastrointestinal: Negative for vomiting and  blood in stool.  Genitourinary: Negative for hematuria and decreased urine volume.  Musculoskeletal: Negative for acute joint swelling Skin: Negative for color change and wound.  Neurological: Negative for tremors and numbness other than noted  Psychiatric/Behavioral: Negative for decreased concentration or  hyperactivity.       Objective:   Physical Exam BP 142/78  Pulse 75  Temp(Src) 99.2 F (37.3 C) (Oral)  SpO2 92% VS noted,  Constitutional: Pt appears well-developed and well-nourished.  HENT: Head: NCAT.  Right Ear: External ear normal.  Left Ear: External ear normal.  Right tm's with severe erythema.  Max sinus areas non tender.  Pharynx with mild erythema, no exudate Eyes: Conjunctivae and EOM are normal. Pupils are equal, round, and reactive to light.  Neck: Normal range of motion. Neck supple.  Cardiovascular: Normal rate and regular rhythm.   Pulmonary/Chest: Effort normal and breath sounds normal.  Neurological: Pt is alert. Not confused  Skin: Skin is warm. No erythema.  Psychiatric: Pt behavior is normal. Thought content normal.     Assessment & Plan:

## 2012-12-18 NOTE — Assessment & Plan Note (Signed)
stable overall by history and exam, recent data reviewed with pt, and pt to continue medical treatment as before,  to f/u any worsening symptoms or concerns BP Readings from Last 3 Encounters:  12/12/12 142/78  12/01/12 139/53  11/03/12 126/58

## 2012-12-18 NOTE — Assessment & Plan Note (Signed)
Mild to mod, for antibx course,  to f/u any worsening symptoms or concerns 

## 2012-12-18 NOTE — Assessment & Plan Note (Signed)
stable overall by history and exam, recent data reviewed with Crystal Fitzpatrick, and Crystal Fitzpatrick to continue medical treatment as before,  to f/u any worsening symptoms or concerns Lab Results  Component Value Date   WBC 5.9 12/08/2010   HGB 10.5* 12/08/2010   HCT 30.9* 12/08/2010   PLT 237.0 12/08/2010   GLUCOSE 93 12/12/2012   CHOL 212* 12/12/2012   TRIG 272.0* 12/12/2012   HDL 36.70* 12/12/2012   LDLDIRECT 135.0 12/12/2012   LDLCALC  Value: 104        Total Cholesterol/HDL:CHD Risk Coronary Heart Disease Risk Table                     Men   Women  1/2 Average Risk   3.4   3.3  Average Risk       5.0   4.4  2 X Average Risk   9.6   7.1  3 X Average Risk  23.4   11.0        Use the calculated Patient Ratio above and the CHD Risk Table to determine the patient's CHD Risk.        ATP III CLASSIFICATION (LDL):  <100     mg/dL   Optimal  409-811  mg/dL   Near or Above                    Optimal  130-159  mg/dL   Borderline  914-782  mg/dL   High  >956     mg/dL   Very High* 07/01/863   ALT 10 12/12/2012   AST 12 12/12/2012   NA 138 12/12/2012   K 5.0 12/12/2012   CL 108 12/12/2012   CREATININE 1.2 12/12/2012   BUN 17 12/12/2012   CO2 25 12/12/2012   TSH 0.625 02/08/2010   INR 1.04 02/08/2010   HGBA1C 6.5 12/12/2012   MICROALBUR 2.1* 12/03/2009   For med refill

## 2012-12-18 NOTE — Assessment & Plan Note (Signed)
stable overall by history and exam, recent data reviewed with pt, and pt to continue medical treatment as before,  to f/u any worsening symptoms or concerns Lab Results  Component Value Date   HGBA1C 6.5 12/12/2012    

## 2012-12-23 ENCOUNTER — Encounter: Payer: Self-pay | Admitting: Physical Medicine and Rehabilitation

## 2012-12-23 ENCOUNTER — Encounter
Payer: Medicare Other | Attending: Physical Medicine and Rehabilitation | Admitting: Physical Medicine and Rehabilitation

## 2012-12-23 VITALS — BP 107/61 | HR 94 | Resp 14 | Ht 70.0 in | Wt 280.0 lb

## 2012-12-23 DIAGNOSIS — M069 Rheumatoid arthritis, unspecified: Secondary | ICD-10-CM | POA: Diagnosis not present

## 2012-12-23 DIAGNOSIS — G547 Phantom limb syndrome without pain: Secondary | ICD-10-CM | POA: Diagnosis not present

## 2012-12-23 DIAGNOSIS — G609 Hereditary and idiopathic neuropathy, unspecified: Secondary | ICD-10-CM

## 2012-12-23 DIAGNOSIS — L02419 Cutaneous abscess of limb, unspecified: Secondary | ICD-10-CM | POA: Diagnosis not present

## 2012-12-23 DIAGNOSIS — M1710 Unilateral primary osteoarthritis, unspecified knee: Secondary | ICD-10-CM | POA: Diagnosis not present

## 2012-12-23 DIAGNOSIS — G546 Phantom limb syndrome with pain: Secondary | ICD-10-CM

## 2012-12-23 DIAGNOSIS — S82009A Unspecified fracture of unspecified patella, initial encounter for closed fracture: Secondary | ICD-10-CM | POA: Insufficient documentation

## 2012-12-23 DIAGNOSIS — S88119A Complete traumatic amputation at level between knee and ankle, unspecified lower leg, initial encounter: Secondary | ICD-10-CM

## 2012-12-23 DIAGNOSIS — W19XXXA Unspecified fall, initial encounter: Secondary | ICD-10-CM | POA: Insufficient documentation

## 2012-12-23 DIAGNOSIS — L03119 Cellulitis of unspecified part of limb: Secondary | ICD-10-CM | POA: Insufficient documentation

## 2012-12-23 DIAGNOSIS — Z89511 Acquired absence of right leg below knee: Secondary | ICD-10-CM

## 2012-12-23 DIAGNOSIS — M1712 Unilateral primary osteoarthritis, left knee: Secondary | ICD-10-CM

## 2012-12-23 MED ORDER — OXYCODONE-ACETAMINOPHEN 10-325 MG PO TABS: 1.0000 | ORAL_TABLET | ORAL | Status: DC | PRN

## 2012-12-23 MED ORDER — FENTANYL 75 MCG/HR TD PT72: 1.0000 | MEDICATED_PATCH | TRANSDERMAL | Status: DC

## 2012-12-23 NOTE — Progress Notes (Signed)
Subjective:    Patient ID: Crystal Fitzpatrick, female    DOB: [birth year], 67 y.o.   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 After lymph edema in 2010 she was not able to wear a prosthetic leg anymore. Since then she was in a wheelchair.  The patient is doing exercises she learned from PT, and she is doing some yoga.  She states, that she is following up with the wound care center, for her small open wound on her left hip. She is asking whether we could order an electric wheel chair. Pain Inventory Average Pain 6 Pain Right Now 6 My pain is sharp, burning, stabbing, tingling and aching  In the last 24 hours, has pain interfered with the following? General activity 6 Relation with others 2 Enjoyment of life 2 What TIME of day is your pain at its worst? morning and night Sleep (in general) Fair  Pain is worse with: inactivity and standing Pain improves with: medication and injections Relief from Meds: 7  Mobility Do you have any goals in this area?  no  Function I need assistance with the following:  meal prep and shopping Do you have any goals in this area?  no  Neuro/Psych No problems in this area  Prior Studies Any changes since last visit?  no  Physicians involved in your care Any changes since last visit?  no   Family History  Problem Relation Age of Onset  . Breast cancer Sister   . Diabetes Other   . Stroke Other     Grandparents   History   Social History  . Marital Status: Divorced    Spouse Name: N/A    Number of Children: 1  . Years of Education: N/A   Occupational History  . retired Marketing executive    Social History Main Topics  . Smoking status: Former Games developer  . Smokeless tobacco: Never Used  . Alcohol Use: No  . Drug Use: No  . Sexually Active: None   Other Topics Concern  . None   Social History Narrative  . None   Past Surgical History  Procedure Laterality Date   . Amputation  02/02/2008    below right knee  . S/p egd  2007    with Botox for? Achalasia  . Tonsillectomy    . S/p breast biopsy  1992  . 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 Tubal ligation    . Nasal septum surgery    . Foot surgury  1980 and 1981   Past Medical History  Diagnosis Date  . DIABETES MELLITUS, TYPE II 07/17/2008  . B12 DEFICIENCY 07/17/2008  . Acute gouty arthropathy 12/11/2008  . HYPONATREMIA 03/20/2010  . HYPERKALEMIA 10/31/2009  . ANEMIA-NOS 07/17/2008  . ANXIETY 07/17/2008  . Chronic pain syndrome 02/14/2010  . Trigeminal neuralgia 02/14/2010  . HEARING LOSS, RIGHT EAR 06/04/2009  . HYPERTENSION 07/17/2008  . CHF 01/28/2009  . Pneumonia, organism unspecified 02/14/2010  . GERD 07/17/2008  . IBS 06/04/2009  . UNSPECIFIED HEPATITIS 01/28/2009  . RENAL INSUFFICIENCY 10/31/2009  . MENOPAUSAL DISORDER 12/03/2009  . Cellulitis and abscess of leg, except foot 11/15/2008  . SKIN ULCER, CHRONIC 02/08/2009  . Rheumatoid arthritis(714.0) 07/17/2008  . SKIN LESION 06/04/2009  . ARTHRITIS 01/28/2009  . FOOT PAIN 07/17/2008  . HYPERSOMNIA 02/14/2010  . Altered mental status 02/07/2010  . PERIPHERAL EDEMA 08/30/2008  . Wheezing 12/03/2009  .  ABDOMINAL DISTENSION 10/11/2008  . Dysuria 11/11/2009  . Hypoxemia 03/20/2010  . ANAPHYLACTIC SHOCK 05/15/2009  . NEUROPATHY, HX OF 01/28/2009  . COLONIC POLYPS, HX OF 07/17/2008  . AMPUTATION, BELOW KNEE, RIGHT, HX OF 01/28/2009  . Hyperlipemia 09/24/2010  . Hyperlipidemia 09/24/2010  . Seizure 10/26/2010  . Pneumonia, aspiration 12/01/2011    Episode July 2013, tx per [facility]   BP 107/61  Pulse 94  Resp 14  Ht 5\' 10"  (1.778 m)  Wt 280 lb (127.007 kg)  BMI 40.18 kg/m2  SpO2 91%     Review of Systems  Constitutional: Positive for fever.  Gastrointestinal: Positive for nausea.  All other systems reviewed and are negative.       Objective:   Physical Exam Constitutional: She appears well-developed and well-nourished.   HENT:  Head: Normocephalic and atraumatic.  Nose: Nose normal.  Eyes: Conjunctivae are normal.  Neck: Normal range of motion. Neck supple.  Pulmonary/Chest: No respiratory distress.  Patient in a wheel chair  Skin : 1-2 cm open sore on her left hip around the trochanter, Musculoskeletal:   Weight is stable but she remains morbidly obese.  Extension deficit of left knee about 15 degrees, flexion 110 degrees  Strength grossly intact, except right lower leg.        Assessment & Plan:  1. Right below-knee amputation, history of phantom limb pain.  2. Peripheral neuropathy.  3. History of rheumatoid arthritis.  4. Osteoarthritis of left knee-improved after synvisc  5. Chronic cellulitis, left leg with associated edema, treated by the wound clinic with Ax and rest.  6. Left patella fx (per pt) after fall at home 5 weeks ago, she saw Dr. Marva Panda, orthopedic surgeon in [place], he diagnosed her with a fissure in the left patella, and with some cysts, per patient, patient will call his office to send [place] the images and his notes.  PLAN:  1. Continue compression stocking to right leg. Advised patient to continue with the exercises she learned from Pt.  2. Continue wound care per the [place] wound care clinic, her open wound has opened up again, unfortunately. She is following up with wound care again.Consider aquatic therapy when wound has healed.  3.Refilled her pain medications today including Percocet 10/325, #130  and fentanyl 75 mcg #10.  -goal will be to taper meds  6.She will follow up with PA in one month.  Consider aquatic therapy, when wound care center approves. She is asking whether we could order an electric wheel chair. I think it would be appropriate for her to get around, putting weight on her left LE is still painful, we can not really strengthen the left LE, because she still has an open wound, and can not do the aquatic exercises, which I think would be the best way in her  situation to strengthen her LLE muscles, and balance without putting to much stress on her left knee joint. Consider ordering an electric wheel chair, after discuss this with Dr. Riley Kill.

## 2012-12-23 NOTE — Patient Instructions (Signed)
Continue with your exercises. 

## 2012-12-27 ENCOUNTER — Encounter: Payer: Medicare Other | Admitting: Physical Medicine and Rehabilitation

## 2013-01-03 DIAGNOSIS — R4182 Altered mental status, unspecified: Secondary | ICD-10-CM | POA: Diagnosis not present

## 2013-01-03 DIAGNOSIS — S88119A Complete traumatic amputation at level between knee and ankle, unspecified lower leg, initial encounter: Secondary | ICD-10-CM | POA: Diagnosis not present

## 2013-01-03 DIAGNOSIS — J159 Unspecified bacterial pneumonia: Secondary | ICD-10-CM | POA: Diagnosis not present

## 2013-01-03 DIAGNOSIS — J189 Pneumonia, unspecified organism: Secondary | ICD-10-CM | POA: Diagnosis not present

## 2013-01-03 DIAGNOSIS — F05 Delirium due to known physiological condition: Secondary | ICD-10-CM | POA: Diagnosis not present

## 2013-01-03 DIAGNOSIS — J9620 Acute and chronic respiratory failure, unspecified whether with hypoxia or hypercapnia: Secondary | ICD-10-CM | POA: Diagnosis present

## 2013-01-03 DIAGNOSIS — E669 Obesity, unspecified: Secondary | ICD-10-CM | POA: Diagnosis present

## 2013-01-03 DIAGNOSIS — I499 Cardiac arrhythmia, unspecified: Secondary | ICD-10-CM | POA: Diagnosis present

## 2013-01-03 DIAGNOSIS — I809 Phlebitis and thrombophlebitis of unspecified site: Secondary | ICD-10-CM | POA: Diagnosis present

## 2013-01-03 DIAGNOSIS — F29 Unspecified psychosis not due to a substance or known physiological condition: Secondary | ICD-10-CM | POA: Diagnosis not present

## 2013-01-03 DIAGNOSIS — I890 Lymphedema, not elsewhere classified: Secondary | ICD-10-CM | POA: Diagnosis present

## 2013-01-03 DIAGNOSIS — J45909 Unspecified asthma, uncomplicated: Secondary | ICD-10-CM | POA: Diagnosis present

## 2013-01-03 DIAGNOSIS — Z6841 Body Mass Index (BMI) 40.0 and over, adult: Secondary | ICD-10-CM | POA: Diagnosis not present

## 2013-01-03 DIAGNOSIS — I509 Heart failure, unspecified: Secondary | ICD-10-CM | POA: Diagnosis present

## 2013-01-03 DIAGNOSIS — E875 Hyperkalemia: Secondary | ICD-10-CM | POA: Diagnosis not present

## 2013-01-03 DIAGNOSIS — R0602 Shortness of breath: Secondary | ICD-10-CM | POA: Diagnosis not present

## 2013-01-03 DIAGNOSIS — L97109 Non-pressure chronic ulcer of unspecified thigh with unspecified severity: Secondary | ICD-10-CM | POA: Diagnosis present

## 2013-01-03 DIAGNOSIS — J9819 Other pulmonary collapse: Secondary | ICD-10-CM | POA: Diagnosis not present

## 2013-01-03 DIAGNOSIS — I10 Essential (primary) hypertension: Secondary | ICD-10-CM | POA: Diagnosis present

## 2013-01-03 DIAGNOSIS — Z66 Do not resuscitate: Secondary | ICD-10-CM | POA: Diagnosis present

## 2013-01-03 DIAGNOSIS — N181 Chronic kidney disease, stage 1: Secondary | ICD-10-CM | POA: Diagnosis not present

## 2013-01-03 DIAGNOSIS — N179 Acute kidney failure, unspecified: Secondary | ICD-10-CM | POA: Diagnosis not present

## 2013-01-03 DIAGNOSIS — E871 Hypo-osmolality and hyponatremia: Secondary | ICD-10-CM | POA: Diagnosis not present

## 2013-01-03 DIAGNOSIS — G894 Chronic pain syndrome: Secondary | ICD-10-CM | POA: Diagnosis present

## 2013-01-03 DIAGNOSIS — E119 Type 2 diabetes mellitus without complications: Secondary | ICD-10-CM | POA: Diagnosis present

## 2013-01-03 DIAGNOSIS — Z9981 Dependence on supplemental oxygen: Secondary | ICD-10-CM | POA: Diagnosis not present

## 2013-01-03 DIAGNOSIS — R509 Fever, unspecified: Secondary | ICD-10-CM | POA: Diagnosis not present

## 2013-01-03 DIAGNOSIS — R259 Unspecified abnormal involuntary movements: Secondary | ICD-10-CM | POA: Diagnosis not present

## 2013-01-03 DIAGNOSIS — J9600 Acute respiratory failure, unspecified whether with hypoxia or hypercapnia: Secondary | ICD-10-CM | POA: Diagnosis not present

## 2013-01-12 ENCOUNTER — Ambulatory Visit: Payer: Medicare Other | Admitting: Internal Medicine

## 2013-01-12 ENCOUNTER — Encounter: Payer: Self-pay | Admitting: Internal Medicine

## 2013-01-12 ENCOUNTER — Other Ambulatory Visit (INDEPENDENT_AMBULATORY_CARE_PROVIDER_SITE_OTHER): Payer: Medicare Other

## 2013-01-12 ENCOUNTER — Ambulatory Visit (INDEPENDENT_AMBULATORY_CARE_PROVIDER_SITE_OTHER): Payer: Medicare Other | Admitting: Internal Medicine

## 2013-01-12 VITALS — BP 128/80 | HR 76 | Temp 99.9°F

## 2013-01-12 DIAGNOSIS — E875 Hyperkalemia: Secondary | ICD-10-CM

## 2013-01-12 DIAGNOSIS — J45909 Unspecified asthma, uncomplicated: Secondary | ICD-10-CM

## 2013-01-12 DIAGNOSIS — E119 Type 2 diabetes mellitus without complications: Secondary | ICD-10-CM | POA: Diagnosis not present

## 2013-01-12 NOTE — Assessment & Plan Note (Signed)
Vs pna now improved off oral d/c meds (antibx, prednisone), doing well, no further tx needed at this time or f/u cxr

## 2013-01-12 NOTE — Assessment & Plan Note (Signed)
Mild elev apparently with recent hospn for unclear reasons, pt reqeusts f/u today,  to f/u any worsening symptoms or concerns

## 2013-01-12 NOTE — Progress Notes (Addendum)
Subjective:    Patient ID: Crystal Fitzpatrick, female    DOB: 06/10/1945, 67 y.o.   MRN: [id number]  HPI  Here with caretaker and also a friend who lives near her; pt with hospn x 2 days last wk [facility] with what sounds like asthmatic bronchitis flare and mild elev K requiring tx with IV antibx, prednisone , and K tx to "flush the kidneys" now here to f/u after finished her d/c oral meds yesterday;  Still has some right ear discomfort, but no further fever, HA, ST, cough, wheezing, sob.  Still has some low grade temp. Denies urinary symptoms such as dysuria, frequency, urgency, flank pain, hematuria or n/v, fever, chills. Denies worsening depressive symptoms, suicidal ideation, or panic  Pt denies polydipsia, polyuria, or low sugar symptoms such as weakness or confusion improved with po intake.   Past Medical History  Diagnosis Date  . DIABETES MELLITUS, TYPE II 07/17/2008  . B12 DEFICIENCY 07/17/2008  . Acute gouty arthropathy 12/11/2008  . HYPONATREMIA 03/20/2010  . HYPERKALEMIA 10/31/2009  . ANEMIA-NOS 07/17/2008  . ANXIETY 07/17/2008  . Chronic pain syndrome 02/14/2010  . Trigeminal neuralgia 02/14/2010  . HEARING LOSS, RIGHT EAR 06/04/2009  . HYPERTENSION 07/17/2008  . CHF 01/28/2009  . Pneumonia, organism unspecified 02/14/2010  . GERD 07/17/2008  . IBS 06/04/2009  . UNSPECIFIED HEPATITIS 01/28/2009  . RENAL INSUFFICIENCY 10/31/2009  . MENOPAUSAL DISORDER 12/03/2009  . Cellulitis and abscess of leg, except foot 11/15/2008  . SKIN ULCER, CHRONIC 02/08/2009  . Rheumatoid arthritis(714.0) 07/17/2008  . SKIN LESION 06/04/2009  . ARTHRITIS 01/28/2009  . FOOT PAIN 07/17/2008  . HYPERSOMNIA 02/14/2010  . Altered mental status 02/07/2010  . PERIPHERAL EDEMA 08/30/2008  . Wheezing 12/03/2009  . ABDOMINAL DISTENSION 10/11/2008  . Dysuria 11/11/2009  . Hypoxemia 03/20/2010  . ANAPHYLACTIC SHOCK 05/15/2009  . NEUROPATHY, HX OF 01/28/2009  . COLONIC POLYPS, HX OF 07/17/2008  . AMPUTATION, BELOW KNEE, RIGHT, HX OF  01/28/2009  . Hyperlipemia 09/24/2010  . Hyperlipidemia 09/24/2010  . Seizure 10/26/2010  . Pneumonia, aspiration 12/01/2011    Episode July 2013, tx per [facility]   Past Surgical History  Procedure Laterality Date  . Amputation  02/02/2008    below right knee  . S/p egd  2007    with Botox for? Achalasia  . Tonsillectomy    . S/p breast biopsy  1992  . 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 Tubal ligation    . Nasal septum surgery    . Foot surgury  1980 and 1981    reports that she has quit smoking. She has never used smokeless tobacco. She reports that she does not drink alcohol or use illicit drugs. family history includes Breast cancer in her sister; Diabetes in her other; Stroke in her other. Allergies  Allergen Reactions  . Morphine Anaphylaxis  . Sulfonamide Derivatives Diarrhea  . Ciprofloxacin Diarrhea  . Doxycycline     Nausea, diarrhea  . Nitrofurantoin     REACTION: itch  . Prednisone     Fast heart rate  . Erythromycin Rash   Current Outpatient Prescriptions on File Prior to Visit  Medication Sig Dispense Refill  . albuterol (PROAIR HFA) 108 (90 BASE) MCG/ACT inhaler Inhale 2 puffs into the lungs every 6 (six) hours as needed.        . ALPRAZolam (XANAX) 0.25 MG tablet TAKE 1 TABLET BY MOUTH 3 TIMES DAILY    AS  NEEDED  90 tablet  2  . Ascorbic Acid (VITAMIN C) 500 MG tablet Take 500 mg by mouth daily.        [name] BENICAR 40 MG tablet TAKE 1 TABLET BY MOUTH EVERY DAY FOR BLOOD PRESSURE  30 tablet  9  . Blood Glucose Monitoring Suppl (ONE TOUCH ULTRA 2) W/DEVICE KIT 1 Device by Does not apply route once.  1 each  0  . Blood Glucose Monitoring Suppl (ONE TOUCH ULTRA SYSTEM KIT) W/DEVICE KIT 1 kit by Does not apply route once.  1 each  0  . cephALEXin (KEFLEX) 500 MG capsule Take 1 capsule (500 mg total) by mouth 4 (four) times daily.  40 capsule  0  . Cholecalciferol (VITAMIN D) 400 UNITS capsule Take 400 Units by mouth daily.        .  cyclobenzaprine (FLEXERIL) 10 MG tablet Take 1 tablet (10 mg total) by mouth every 8 (eight) hours as needed.  90 tablet  5  . diclofenac sodium (VOLTAREN) 1 % GEL Apply 2 g topically 4 (four) times daily.  3 Tube  3  . diphenoxylate-atropine (LOMOTIL) 2.5-0.025 MG per tablet TAKE ONE TABLET TWICE DAILY AS NEEDED  60 tablet  2  . DULoxetine (CYMBALTA) 60 MG capsule Take 2 capsules (120 mg total) by mouth daily.  60 capsule  5  . EPINEPHrine (EPIPEN 2-PAK) 0.3 mg/0.3 mL DEVI Inject 0.3 mLs (0.3 mg total) into the muscle once. Use as directed  1 Device  0  . fentaNYL (DURAGESIC - DOSED MCG/HR) 75 MCG/HR Place 1 patch (75 mcg total) onto the skin every 3 (three) days. 1 patch every 3 days  10 patch  0  . furosemide (LASIX) 20 MG tablet TAKE 1 TABLET BY MOUTH EVERY DAY  30 tablet  1  . gabapentin (NEURONTIN) 600 MG tablet Take 1 tablet (600 mg total) by mouth 3 (three) times daily.  90 tablet  3  . lidocaine (LIDODERM) 5 % Place 1 patch onto the skin. Remove & Discard patch within 12 hours or as directed by MD       . loperamide (ANTI-DIARRHEAL) 2 MG tablet Take 2 mg by mouth 2 (two) times daily as needed.        . meclizine (ANTIVERT) 25 MG tablet Take 1 tablet (25 mg total) by mouth 3 (three) times daily as needed.  30 tablet  0  . Multiple Vitamins-Minerals (CENTRUM SILVER ULTRA WOMENS PO) Take by mouth daily.        . Omega-3 Fatty Acids (FISH OIL) 1000 MG CAPS Take by mouth daily.        Letta Pate DELICA LANCETS MISC Use as directed to test blood sugar dx 250.00  100 each  2  . oxyCODONE-acetaminophen (PERCOCET) 10-325 MG per tablet Take 1 tablet by mouth every 4 (four) hours as needed for pain.  130 tablet  0  . pantoprazole (PROTONIX) 40 MG tablet TAKE ONE (1) TABLET BY MOUTH EVERY DAY  FOR ACID REFLUX  30 tablet  11  . promethazine (PHENERGAN) 25 MG tablet TAKE 1 TABLET BY MOUTH EVERY 6 HOURS    AS NEEDED FOR NAUSEA  60 tablet  1  . Psyllium (METAMUCIL) 30.9 % POWD Take by mouth. Take as  directed       . topiramate (TOPAMAX) 100 MG tablet TAKE 3 TABLETS BY MOUTH EACH NIGHT AT BEDTIME  90 tablet  3   No current facility-administered medications on file prior to visit.  Review of Systems  Constitutional: Negative for unexpected weight change, or unusual diaphoresis  HENT: Negative for tinnitus.   Eyes: Negative for photophobia and visual disturbance.  Respiratory: Negative for choking and stridor.   Gastrointestinal: Negative for vomiting and blood in stool.  Genitourinary: Negative for hematuria and decreased urine volume.  Musculoskeletal: Negative for acute joint swelling Skin: Negative for color change and wound.  Neurological: Negative for tremors and numbness other than noted  Psychiatric/Behavioral: Negative for decreased concentration or  hyperactivity.       Objective:   Physical Exam BP 128/80  Pulse 76  Temp(Src) 99.9 F (37.7 C) (Oral)  SpO2 91% VS noted, fatigued but nontoxic Constitutional: Pt appears well-developed and well-nourished.  HENT: Head: NCAT.  Right Ear: External ear normal.  Left Ear: External ear normal.  bilat tm's with mil erythema, nonbulgind Sinus nontender, pharynx benign Eyes: Conjunctivae and EOM are normal. Pupils are equal, round, and reactive to light.  Neck: Normal range of motion. Neck supple.  Cardiovascular: Normal rate and regular rhythm.   Pulmonary/Chest: Effort normal and breath sounds without rales or wheezing (especially to left as she mentions the hosp MD told her she had sounds).  S/p right leg amputation as before Neurological: Pt is alert. Not confused  Skin: Skin is warm. No erythema.  Psychiatric: Pt behavior is normal. Thought content normal. Minor nervous, not depressed affect    Assessment & Plan:  Quality Measures addressed:  Mammogram:  pt declines and will self-refer Diabetes LDL < 100: pt declines further medication Diabetes Hgba1c < 8%: pt declines further medication

## 2013-01-12 NOTE — Patient Instructions (Signed)
Please continue all other medications as before, and refills have been done if requested. You can also take Mucinex (or it's generic off brand) for congestion Please go to the LAB in the Basement (turn left off the elevator) for the tests to be done today You will be contacted by phone if any changes need to be made immediately.  Otherwise, you will receive a letter about your results with an explanation, but please check with MyChart first.

## 2013-01-12 NOTE — Assessment & Plan Note (Signed)
stable overall by history and exam, recent data reviewed with pt, and pt to continue medical treatment as before,  to f/u any worsening symptoms or concerns Lab Results  Component Value Date   HGBA1C 6.5 12/12/2012

## 2013-01-13 LAB — BASIC METABOLIC PANEL
BUN: 16 mg/dL (ref 6–23)
Creatinine, Ser: 1.2 mg/dL (ref 0.4–1.2)
GFR: 47.8 mL/min — ABNORMAL LOW (ref >60.00)

## 2013-01-18 ENCOUNTER — Encounter
Payer: Medicare Other | Attending: Physical Medicine and Rehabilitation | Admitting: Physical Medicine and Rehabilitation

## 2013-01-18 ENCOUNTER — Encounter: Payer: Self-pay | Admitting: Physical Medicine and Rehabilitation

## 2013-01-18 VITALS — BP 133/64 | HR 85 | Resp 14 | Ht 69.0 in | Wt 279.0 lb

## 2013-01-18 DIAGNOSIS — I10 Essential (primary) hypertension: Secondary | ICD-10-CM | POA: Insufficient documentation

## 2013-01-18 DIAGNOSIS — S88119A Complete traumatic amputation at level between knee and ankle, unspecified lower leg, initial encounter: Secondary | ICD-10-CM | POA: Insufficient documentation

## 2013-01-18 DIAGNOSIS — L02419 Cutaneous abscess of limb, unspecified: Secondary | ICD-10-CM | POA: Diagnosis not present

## 2013-01-18 DIAGNOSIS — G547 Phantom limb syndrome without pain: Secondary | ICD-10-CM | POA: Insufficient documentation

## 2013-01-18 DIAGNOSIS — M1712 Unilateral primary osteoarthritis, left knee: Secondary | ICD-10-CM

## 2013-01-18 DIAGNOSIS — E785 Hyperlipidemia, unspecified: Secondary | ICD-10-CM | POA: Diagnosis not present

## 2013-01-18 DIAGNOSIS — M1710 Unilateral primary osteoarthritis, unspecified knee: Secondary | ICD-10-CM | POA: Diagnosis not present

## 2013-01-18 DIAGNOSIS — E119 Type 2 diabetes mellitus without complications: Secondary | ICD-10-CM | POA: Insufficient documentation

## 2013-01-18 DIAGNOSIS — G609 Hereditary and idiopathic neuropathy, unspecified: Secondary | ICD-10-CM | POA: Diagnosis not present

## 2013-01-18 DIAGNOSIS — Z89511 Acquired absence of right leg below knee: Secondary | ICD-10-CM

## 2013-01-18 DIAGNOSIS — M069 Rheumatoid arthritis, unspecified: Secondary | ICD-10-CM | POA: Insufficient documentation

## 2013-01-18 DIAGNOSIS — Z8781 Personal history of (healed) traumatic fracture: Secondary | ICD-10-CM | POA: Insufficient documentation

## 2013-01-18 DIAGNOSIS — G546 Phantom limb syndrome with pain: Secondary | ICD-10-CM

## 2013-01-18 MED ORDER — OXYCODONE-ACETAMINOPHEN 10-325 MG PO TABS: 1.0000 | ORAL_TABLET | ORAL | Status: DC | PRN

## 2013-01-18 MED ORDER — FENTANYL 75 MCG/HR TD PT72: 1.0000 | MEDICATED_PATCH | TRANSDERMAL | Status: DC

## 2013-01-18 NOTE — Patient Instructions (Signed)
Continue with your yoga, and try to incorporate the breathing technics I showed you today.

## 2013-01-18 NOTE — Progress Notes (Signed)
Subjective:    Patient ID: Crystal Fitzpatrick, female    DOB: January 25, 1946, 67 y.o.   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 After lymph edema in 2010 she was not able to wear a prosthetic leg anymore. Since then she was in a wheelchair.  The patient is doing exercises she learned from PT, and she is doing some yoga.  She states, that she is following up with the wound care center, for her small open wound on her left hip.  She is asking whether we could order an electric wheel chair.  Pain Inventory Average Pain 7 Pain Right Now 7 My pain is sharp, burning, dull and aching  In the last 24 hours, has pain interfered with the following? General activity 5 Relation with others 5 Enjoyment of life 5 What TIME of day is your pain at its worst? na Sleep (in general) morning and night  Pain is worse with: inactivity and standing Pain improves with: medication and injections Relief from Meds: 7  Mobility how many minutes can you walk? 0 ability to climb steps?  no do you drive?  no use a wheelchair transfers alone  Function disabled: date disabled na I need assistance with the following:  meal prep  Neuro/Psych trouble walking  Prior Studies Any changes since last visit?  no  Physicians involved in your care Any changes since last visit?  no   Family History  Problem Relation Age of Onset  . Breast cancer Sister   . Diabetes Other   . Stroke Other     Grandparents   History   Social History  . Marital Status: Divorced    Spouse Name: N/A    Number of Children: 1  . Years of Education: N/A   Occupational History  . retired Marketing executive    Social History Main Topics  . Smoking status: Former Games developer  . Smokeless tobacco: Never Used  . Alcohol Use: No  . Drug Use: No  . Sexual Activity: None   Other Topics Concern  . None   Social History Narrative  . None   Past Surgical History   Procedure Laterality Date  . Amputation  02/02/2008    below right knee  . S/p egd  2007    with Botox for? Achalasia  . Tonsillectomy    . S/p breast biopsy  1992  . 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 Tubal ligation    . Nasal septum surgery    . Foot surgury  1980 and 1981   Past Medical History  Diagnosis Date  . DIABETES MELLITUS, TYPE II 07/17/2008  . B12 DEFICIENCY 07/17/2008  . Acute gouty arthropathy 12/11/2008  . HYPONATREMIA 03/20/2010  . HYPERKALEMIA 10/31/2009  . ANEMIA-NOS 07/17/2008  . ANXIETY 07/17/2008  . Chronic pain syndrome 02/14/2010  . Trigeminal neuralgia 02/14/2010  . HEARING LOSS, RIGHT EAR 06/04/2009  . HYPERTENSION 07/17/2008  . CHF 01/28/2009  . Pneumonia, organism unspecified 02/14/2010  . GERD 07/17/2008  . IBS 06/04/2009  . UNSPECIFIED HEPATITIS 01/28/2009  . RENAL INSUFFICIENCY 10/31/2009  . MENOPAUSAL DISORDER 12/03/2009  . Cellulitis and abscess of leg, except foot 11/15/2008  . SKIN ULCER, CHRONIC 02/08/2009  . Rheumatoid arthritis(714.0) 07/17/2008  . SKIN LESION 06/04/2009  . ARTHRITIS 01/28/2009  . FOOT PAIN 07/17/2008  . HYPERSOMNIA 02/14/2010  . Altered mental status 02/07/2010  . PERIPHERAL EDEMA 08/30/2008  .  Wheezing 12/03/2009  . ABDOMINAL DISTENSION 10/11/2008  . Dysuria 11/11/2009  . Hypoxemia 03/20/2010  . ANAPHYLACTIC SHOCK 05/15/2009  . NEUROPATHY, HX OF 01/28/2009  . COLONIC POLYPS, HX OF 07/17/2008  . AMPUTATION, BELOW KNEE, RIGHT, HX OF 01/28/2009  . Hyperlipemia 09/24/2010  . Hyperlipidemia 09/24/2010  . Seizure 10/26/2010  . Pneumonia, aspiration 12/01/2011    Episode July 2013, tx per [facility]   BP 133/64  Pulse 85  Resp 14  Ht 5\' 9"  (1.753 m)  Wt 279 lb (126.554 kg)  BMI 41.18 kg/m2  SpO2 90%     Review of Systems  Constitutional: Positive for fever and chills.  HENT:       Respiratory infections  Musculoskeletal: Positive for gait problem.  All other systems reviewed and are negative.       Objective:    Physical Exam Constitutional: She appears well-developed and well-nourished.  HENT:  Head: Normocephalic and atraumatic.  Nose: Nose normal.  Eyes: Conjunctivae are normal.  Neck: Normal range of motion. Neck supple.  Pulmonary/Chest: No respiratory distress.  Patient in a wheel chair  Skin : 1-2 cm open sore on her left hip around the trochanter, Musculoskeletal:   Weight is stable but she remains morbidly obese.  Extension deficit of left knee about 15 degrees, flexion 110 degrees  Strength grossly intact, except right lower leg.        Assessment & Plan:  1. Right below-knee amputation, history of phantom limb pain.  2. Peripheral neuropathy.  3. History of rheumatoid arthritis.  4. Osteoarthritis of left knee-improved after synvisc  5. Chronic cellulitis, left leg with associated edema, treated by the wound clinic with Ax and rest.  6. Left patella fx (per pt) after fall at home 5 weeks ago, she saw Dr. Marva Panda, orthopedic surgeon in [place], he diagnosed her with a fissure in the left patella, and with some cysts, per patient, patient will call his office to send [place] the images and his notes.  PLAN:  1. Continue compression stocking to right leg. Advised patient to continue with the exercises she learned from Pt.  2. Continue wound care per the [place] wound care clinic, her open wound has opened up again, unfortunately. She is following up with wound care again.Consider aquatic therapy when wound has healed.  3.Refilled her pain medications today including Percocet 10/325, #130  and fentanyl 75 mcg #10.  -goal will be to taper meds  6.She will follow up with PA in one month.  Consider aquatic therapy, when wound care center approves.  She is asking whether we could order an electric wheel chair. I think it would be appropriate for her to get around, putting weight on her left LE is still painful, we can not really strengthen the left LE, because she still has an open wound, and  can not do the aquatic exercises, which I think would be the best way in her situation to strengthen her LLE muscles, and balance without putting to much stress on her left knee joint. Consider ordering an electric wheel chair,  after patient finds out what kind of chair .

## 2013-01-20 ENCOUNTER — Encounter: Payer: Medicare Other | Admitting: Physical Medicine and Rehabilitation

## 2013-01-26 DIAGNOSIS — B351 Tinea unguium: Secondary | ICD-10-CM | POA: Diagnosis not present

## 2013-01-26 DIAGNOSIS — E1142 Type 2 diabetes mellitus with diabetic polyneuropathy: Secondary | ICD-10-CM | POA: Diagnosis not present

## 2013-01-26 DIAGNOSIS — M2040 Other hammer toe(s) (acquired), unspecified foot: Secondary | ICD-10-CM | POA: Diagnosis not present

## 2013-01-26 DIAGNOSIS — E1149 Type 2 diabetes mellitus with other diabetic neurological complication: Secondary | ICD-10-CM | POA: Diagnosis not present

## 2013-01-31 ENCOUNTER — Telehealth: Payer: Self-pay

## 2013-01-31 ENCOUNTER — Other Ambulatory Visit: Payer: Self-pay | Admitting: *Deleted

## 2013-01-31 MED ORDER — PANTOPRAZOLE SODIUM 40 MG PO TBEC: DELAYED_RELEASE_TABLET | ORAL | Status: DC

## 2013-01-31 MED ORDER — GABAPENTIN 600 MG PO TABS: 600.0000 mg | ORAL_TABLET | Freq: Three times a day (TID) | ORAL | Status: DC

## 2013-01-31 NOTE — Telephone Encounter (Signed)
[facility] called for refill on gabapentin.  Request sent electronically.

## 2013-02-02 DIAGNOSIS — E119 Type 2 diabetes mellitus without complications: Secondary | ICD-10-CM | POA: Diagnosis not present

## 2013-02-02 DIAGNOSIS — H5210 Myopia, unspecified eye: Secondary | ICD-10-CM | POA: Diagnosis not present

## 2013-02-02 DIAGNOSIS — H52229 Regular astigmatism, unspecified eye: Secondary | ICD-10-CM | POA: Diagnosis not present

## 2013-02-02 DIAGNOSIS — H26499 Other secondary cataract, unspecified eye: Secondary | ICD-10-CM | POA: Diagnosis not present

## 2013-02-03 DIAGNOSIS — I499 Cardiac arrhythmia, unspecified: Secondary | ICD-10-CM | POA: Diagnosis not present

## 2013-02-03 DIAGNOSIS — I509 Heart failure, unspecified: Secondary | ICD-10-CM | POA: Diagnosis not present

## 2013-02-03 DIAGNOSIS — L97109 Non-pressure chronic ulcer of unspecified thigh with unspecified severity: Secondary | ICD-10-CM | POA: Diagnosis not present

## 2013-02-03 DIAGNOSIS — I10 Essential (primary) hypertension: Secondary | ICD-10-CM | POA: Diagnosis not present

## 2013-02-03 DIAGNOSIS — I00 Rheumatic fever without heart involvement: Secondary | ICD-10-CM | POA: Diagnosis not present

## 2013-02-03 DIAGNOSIS — Z9981 Dependence on supplemental oxygen: Secondary | ICD-10-CM | POA: Diagnosis not present

## 2013-02-03 DIAGNOSIS — I809 Phlebitis and thrombophlebitis of unspecified site: Secondary | ICD-10-CM | POA: Diagnosis not present

## 2013-02-03 DIAGNOSIS — E669 Obesity, unspecified: Secondary | ICD-10-CM | POA: Diagnosis not present

## 2013-02-09 ENCOUNTER — Other Ambulatory Visit: Payer: Self-pay

## 2013-02-09 MED ORDER — TOPIRAMATE 100 MG PO TABS: ORAL_TABLET | ORAL | Status: DC

## 2013-02-12 DIAGNOSIS — Z9981 Dependence on supplemental oxygen: Secondary | ICD-10-CM | POA: Diagnosis not present

## 2013-02-12 DIAGNOSIS — Z9119 Patient's noncompliance with other medical treatment and regimen: Secondary | ICD-10-CM | POA: Diagnosis not present

## 2013-02-12 DIAGNOSIS — N184 Chronic kidney disease, stage 4 (severe): Secondary | ICD-10-CM | POA: Diagnosis present

## 2013-02-12 DIAGNOSIS — E1142 Type 2 diabetes mellitus with diabetic polyneuropathy: Secondary | ICD-10-CM | POA: Diagnosis present

## 2013-02-12 DIAGNOSIS — G9341 Metabolic encephalopathy: Secondary | ICD-10-CM | POA: Diagnosis not present

## 2013-02-12 DIAGNOSIS — S88119A Complete traumatic amputation at level between knee and ankle, unspecified lower leg, initial encounter: Secondary | ICD-10-CM | POA: Diagnosis not present

## 2013-02-12 DIAGNOSIS — K5900 Constipation, unspecified: Secondary | ICD-10-CM | POA: Diagnosis present

## 2013-02-12 DIAGNOSIS — E662 Morbid (severe) obesity with alveolar hypoventilation: Secondary | ICD-10-CM | POA: Diagnosis present

## 2013-02-12 DIAGNOSIS — G894 Chronic pain syndrome: Secondary | ICD-10-CM | POA: Diagnosis present

## 2013-02-12 DIAGNOSIS — G609 Hereditary and idiopathic neuropathy, unspecified: Secondary | ICD-10-CM | POA: Diagnosis present

## 2013-02-12 DIAGNOSIS — J9600 Acute respiratory failure, unspecified whether with hypoxia or hypercapnia: Secondary | ICD-10-CM | POA: Diagnosis not present

## 2013-02-12 DIAGNOSIS — Z6841 Body Mass Index (BMI) 40.0 and over, adult: Secondary | ICD-10-CM | POA: Diagnosis not present

## 2013-02-12 DIAGNOSIS — J9620 Acute and chronic respiratory failure, unspecified whether with hypoxia or hypercapnia: Secondary | ICD-10-CM | POA: Diagnosis present

## 2013-02-12 DIAGNOSIS — Z23 Encounter for immunization: Secondary | ICD-10-CM | POA: Diagnosis not present

## 2013-02-12 DIAGNOSIS — R0609 Other forms of dyspnea: Secondary | ICD-10-CM | POA: Diagnosis not present

## 2013-02-12 DIAGNOSIS — E785 Hyperlipidemia, unspecified: Secondary | ICD-10-CM | POA: Diagnosis present

## 2013-02-12 DIAGNOSIS — R918 Other nonspecific abnormal finding of lung field: Secondary | ICD-10-CM | POA: Diagnosis not present

## 2013-02-12 DIAGNOSIS — G4733 Obstructive sleep apnea (adult) (pediatric): Secondary | ICD-10-CM | POA: Diagnosis not present

## 2013-02-12 DIAGNOSIS — I509 Heart failure, unspecified: Secondary | ICD-10-CM | POA: Diagnosis present

## 2013-02-12 DIAGNOSIS — I890 Lymphedema, not elsewhere classified: Secondary | ICD-10-CM | POA: Diagnosis present

## 2013-02-12 DIAGNOSIS — Z882 Allergy status to sulfonamides status: Secondary | ICD-10-CM | POA: Diagnosis not present

## 2013-02-12 DIAGNOSIS — R259 Unspecified abnormal involuntary movements: Secondary | ICD-10-CM | POA: Diagnosis present

## 2013-02-12 DIAGNOSIS — N179 Acute kidney failure, unspecified: Secondary | ICD-10-CM | POA: Diagnosis not present

## 2013-02-12 DIAGNOSIS — E875 Hyperkalemia: Secondary | ICD-10-CM | POA: Diagnosis not present

## 2013-02-12 DIAGNOSIS — Z79899 Other long term (current) drug therapy: Secondary | ICD-10-CM | POA: Diagnosis not present

## 2013-02-12 DIAGNOSIS — R5381 Other malaise: Secondary | ICD-10-CM | POA: Diagnosis present

## 2013-02-12 DIAGNOSIS — W1811XA Fall from or off toilet without subsequent striking against object, initial encounter: Secondary | ICD-10-CM | POA: Diagnosis not present

## 2013-02-12 DIAGNOSIS — R4182 Altered mental status, unspecified: Secondary | ICD-10-CM | POA: Diagnosis not present

## 2013-02-12 DIAGNOSIS — E1149 Type 2 diabetes mellitus with other diabetic neurological complication: Secondary | ICD-10-CM | POA: Diagnosis present

## 2013-02-16 ENCOUNTER — Encounter: Payer: Self-pay | Admitting: Physical Medicine and Rehabilitation

## 2013-02-16 ENCOUNTER — Encounter
Payer: Medicare Other | Attending: Physical Medicine and Rehabilitation | Admitting: Physical Medicine and Rehabilitation

## 2013-02-16 VITALS — BP 148/77 | HR 85 | Resp 16 | Ht 69.0 in | Wt 279.0 lb

## 2013-02-16 DIAGNOSIS — G547 Phantom limb syndrome without pain: Secondary | ICD-10-CM | POA: Diagnosis not present

## 2013-02-16 DIAGNOSIS — M47817 Spondylosis without myelopathy or radiculopathy, lumbosacral region: Secondary | ICD-10-CM | POA: Diagnosis not present

## 2013-02-16 DIAGNOSIS — G609 Hereditary and idiopathic neuropathy, unspecified: Secondary | ICD-10-CM

## 2013-02-16 DIAGNOSIS — Z89511 Acquired absence of right leg below knee: Secondary | ICD-10-CM

## 2013-02-16 DIAGNOSIS — L02419 Cutaneous abscess of limb, unspecified: Secondary | ICD-10-CM | POA: Diagnosis not present

## 2013-02-16 DIAGNOSIS — S88119A Complete traumatic amputation at level between knee and ankle, unspecified lower leg, initial encounter: Secondary | ICD-10-CM | POA: Diagnosis not present

## 2013-02-16 DIAGNOSIS — R609 Edema, unspecified: Secondary | ICD-10-CM | POA: Diagnosis not present

## 2013-02-16 DIAGNOSIS — G546 Phantom limb syndrome with pain: Secondary | ICD-10-CM

## 2013-02-16 DIAGNOSIS — M1710 Unilateral primary osteoarthritis, unspecified knee: Secondary | ICD-10-CM | POA: Diagnosis not present

## 2013-02-16 NOTE — Progress Notes (Signed)
Subjective:    Patient ID: Crystal Fitzpatrick, female    DOB: [birth year], 67 y.o.   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 After lymph edema in 2010 she was not able to wear a prosthetic leg anymore. Since then she was in a wheelchair.  The patient is doing exercises she learned from PT, and she is doing some yoga.  She states, that she is following up with the wound care center, for her small open wound on her left hip.  She is asking whether we could order an electric wheel chair. The patient reports that she was in the hospital in [place] from Sunday 02/12/13 to Wednesday 02/15/13, she states that she lost consciuosness, her aid found her and she was admitted to the hospital, she states that she had kidney problems at that time, but she states that her kidney function recovered fully after a couple of days.   Pain Inventory Average Pain 7 Pain Right Now 7 My pain is dull and tingling  In the last 24 hours, has pain interfered with the following? General activity 7 Relation with others 5 Enjoyment of life 4 What TIME of day is your pain at its worst? morning and night Sleep (in general) Fair  Pain is worse with: inactivity Pain improves with: rest, therapy/exercise and medication Relief from Meds: 7  Mobility use a wheelchair Do you have any goals in this area?  yes  Function disabled: date disabled . Do you have any goals in this area?  yes  Neuro/Psych trouble walking  Prior Studies Any changes since last visit?  no  Physicians involved in your care Any changes since last visit?  yes hospitialized   Family History  Problem Relation Age of Onset  . Breast cancer Sister   . Diabetes Other   . Stroke Other     Grandparents   History   Social History  . Marital Status: Divorced    Spouse Name: N/A    Number of Children: 1  . Years of Education: N/A   Occupational History  . retired Engineer, civil (consulting)    Social History Main Topics  . Smoking status: Former Games developer  . Smokeless tobacco: Never Used  . Alcohol Use: No  . Drug Use: No  . Sexual Activity: None   Other Topics Concern  . None   Social History Narrative  . None   Past Surgical History  Procedure Laterality Date  . Amputation  02/02/2008    below right knee  . S/p egd  2007    with Botox for? Achalasia  . Tonsillectomy    . S/p breast biopsy  1992  . 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 Tubal ligation    . Nasal septum surgery    . Foot surgury  1980 and 1981   Past Medical History  Diagnosis Date  . DIABETES MELLITUS, TYPE II 07/17/2008  . B12 DEFICIENCY 07/17/2008  . Acute gouty arthropathy 12/11/2008  . HYPONATREMIA 03/20/2010  . HYPERKALEMIA 10/31/2009  . ANEMIA-NOS 07/17/2008  . ANXIETY 07/17/2008  . Chronic pain syndrome 02/14/2010  . Trigeminal neuralgia 02/14/2010  . HEARING LOSS, RIGHT EAR 06/04/2009  . HYPERTENSION 07/17/2008  . CHF 01/28/2009  . Pneumonia, organism unspecified 02/14/2010  . GERD 07/17/2008  . IBS 06/04/2009  . UNSPECIFIED HEPATITIS 01/28/2009  . RENAL INSUFFICIENCY 10/31/2009  . MENOPAUSAL DISORDER 12/03/2009  . Cellulitis  and abscess of leg, except foot 11/15/2008  . SKIN ULCER, CHRONIC 02/08/2009  . Rheumatoid arthritis(714.0) 07/17/2008  . SKIN LESION 06/04/2009  . ARTHRITIS 01/28/2009  . FOOT PAIN 07/17/2008  . HYPERSOMNIA 02/14/2010  . Altered mental status 02/07/2010  . PERIPHERAL EDEMA 08/30/2008  . Wheezing 12/03/2009  . ABDOMINAL DISTENSION 10/11/2008  . Dysuria 11/11/2009  . Hypoxemia 03/20/2010  . ANAPHYLACTIC SHOCK 05/15/2009  . NEUROPATHY, HX OF 01/28/2009  . COLONIC POLYPS, HX OF 07/17/2008  . AMPUTATION, BELOW KNEE, RIGHT, HX OF 01/28/2009  . Hyperlipemia 09/24/2010  . Hyperlipidemia 09/24/2010  . Seizure 10/26/2010  . Pneumonia, aspiration 12/01/2011    Episode July 2013, tx per [facility]   BP 148/77  Pulse 85  Resp 16  Ht 5\' 9"  (1.753 m)  Wt 279 lb  (126.554 kg)  BMI 41.18 kg/m2  SpO2 94%     Review of Systems  Musculoskeletal: Positive for back pain and gait problem.  Neurological: Positive for headaches.  All other systems reviewed and are negative.       Objective:   Physical Exam Constitutional: She appears well-developed and well-nourished.  HENT:  Head: Normocephalic and atraumatic.  Nose: Nose normal.  Eyes: Conjunctivae are normal.  Neck: Normal range of motion. Neck supple.  Pulmonary/Chest: No respiratory distress.  Patient in a wheel chair  Skin : 1-2 cm open sore on her left hip around the trochanter, Musculoskeletal:   Weight is stable but she remains morbidly obese.  Extension deficit of left knee about 15 degrees, flexion 110 degrees  Strength grossly intact, except right lower leg.         Assessment & Plan:  1. Right below-knee amputation, history of phantom limb pain.  2. Peripheral neuropathy.  3. History of rheumatoid arthritis.  4. Osteoarthritis of left knee-improved after synvisc  5. Chronic cellulitis, left leg with associated edema, treated by the wound clinic with Ax and rest.  6. Left patella fx (per pt) after fall at home 5 weeks ago, she saw Dr. Marva Panda, orthopedic surgeon in [place], he diagnosed her with a fissure in the left patella, and with some cysts, per patient, patient will call his office to send [place] the images and his notes.  7. The patient reports that she was in the hospital in [place] from Sunday 02/12/13 to Wednesday 02/15/13, she states that she lost consciuosness, her aid found her and she was admitted to the hospital, she states that she had kidney problems at that time, but she states that her kidney function recovered fully after a couple of days. Unfortunately we do not have any notes of this stay. PLAN:  1. Continue compression stocking to right leg. Advised patient to continue with the exercises she learned from Pt.  2. Continue wound care per the [place] wound care  clinic, her open wound has opened up again, unfortunately. She is following up with wound care again.Consider aquatic therapy when wound has healed.  3.Did not refill her pain medications today including Percocet 10/325, #130  and fentanyl 75 mcg #10, because it is at least the second time, that the patient did not bring her medications to the visit. The last time and today they brought the gabapentin bottle instead of the narcotics. They will bring in the meds tomorrow for a pill count, the meds were filled on 01/30/13. The patient's aid was upset, that she had to come back, I explained to her that in the contract it is  stated that the patient has to bring the bottles at every visit, I let this go at least one other visit Then I will give them the Rxs. Also I was considering prescribing Oxycodone without the acetaminophen, but patient had anaphylactic reaction to morphine, and I am reluctant to take the risk of her having such a severe reaction to the Oxycodone without the acetaminophen, will talk to Dr. Riley Kill about this. But I will slowly decrease her Oxycodone/acetaminophen. -goal will be to taper meds  6.She will follow up with PA in one month.  Consider aquatic therapy, when wound care center approves.  She is asking whether we could order an electric wheel chair. I think it would be appropriate for her to get around, putting weight on her left LE is still painful, we can not really strengthen the left LE, because she still has an open wound, and can not do the aquatic exercises, which I think would be the best way in her situation to strengthen her LLE muscles, and balance without putting to much stress on her left knee joint. Consider ordering an electric wheel chair, after patient finds out what kind of chair, and contacts a company .

## 2013-02-17 MED ORDER — FENTANYL 75 MCG/HR TD PT72: 1.0000 | MEDICATED_PATCH | TRANSDERMAL | Status: DC

## 2013-02-17 MED ORDER — OXYCODONE-ACETAMINOPHEN 10-325 MG PO TABS: 1.0000 | ORAL_TABLET | ORAL | Status: DC | PRN

## 2013-02-17 NOTE — Patient Instructions (Signed)
Stay as active as tolerated. 

## 2013-02-17 NOTE — Addendum Note (Signed)
Addended by: Su Monks on: 02/17/2013 10:24 AM   Modules accepted: Orders

## 2013-02-21 DIAGNOSIS — G894 Chronic pain syndrome: Secondary | ICD-10-CM | POA: Diagnosis not present

## 2013-02-21 DIAGNOSIS — S88119A Complete traumatic amputation at level between knee and ankle, unspecified lower leg, initial encounter: Secondary | ICD-10-CM | POA: Diagnosis not present

## 2013-02-21 DIAGNOSIS — N184 Chronic kidney disease, stage 4 (severe): Secondary | ICD-10-CM | POA: Diagnosis not present

## 2013-02-21 DIAGNOSIS — IMO0001 Reserved for inherently not codable concepts without codable children: Secondary | ICD-10-CM | POA: Diagnosis not present

## 2013-02-21 DIAGNOSIS — I509 Heart failure, unspecified: Secondary | ICD-10-CM | POA: Diagnosis not present

## 2013-02-21 DIAGNOSIS — E1149 Type 2 diabetes mellitus with other diabetic neurological complication: Secondary | ICD-10-CM | POA: Diagnosis not present

## 2013-02-21 DIAGNOSIS — E1142 Type 2 diabetes mellitus with diabetic polyneuropathy: Secondary | ICD-10-CM | POA: Diagnosis not present

## 2013-02-23 ENCOUNTER — Ambulatory Visit: Payer: Medicare Other | Admitting: Internal Medicine

## 2013-02-23 DIAGNOSIS — G4730 Sleep apnea, unspecified: Secondary | ICD-10-CM | POA: Diagnosis not present

## 2013-02-23 DIAGNOSIS — R0602 Shortness of breath: Secondary | ICD-10-CM | POA: Diagnosis not present

## 2013-02-23 DIAGNOSIS — G4710 Hypersomnia, unspecified: Secondary | ICD-10-CM | POA: Diagnosis not present

## 2013-02-23 DIAGNOSIS — E669 Obesity, unspecified: Secondary | ICD-10-CM | POA: Diagnosis not present

## 2013-02-26 IMAGING — CR DG CHEST 1V
1 series · 1 of 1 positions shown · non-contrast
Comparison: 02/11/2010

CLINICAL DATA: Follow up pneumonia

CHEST - 1 VIEW

[view not recorded]
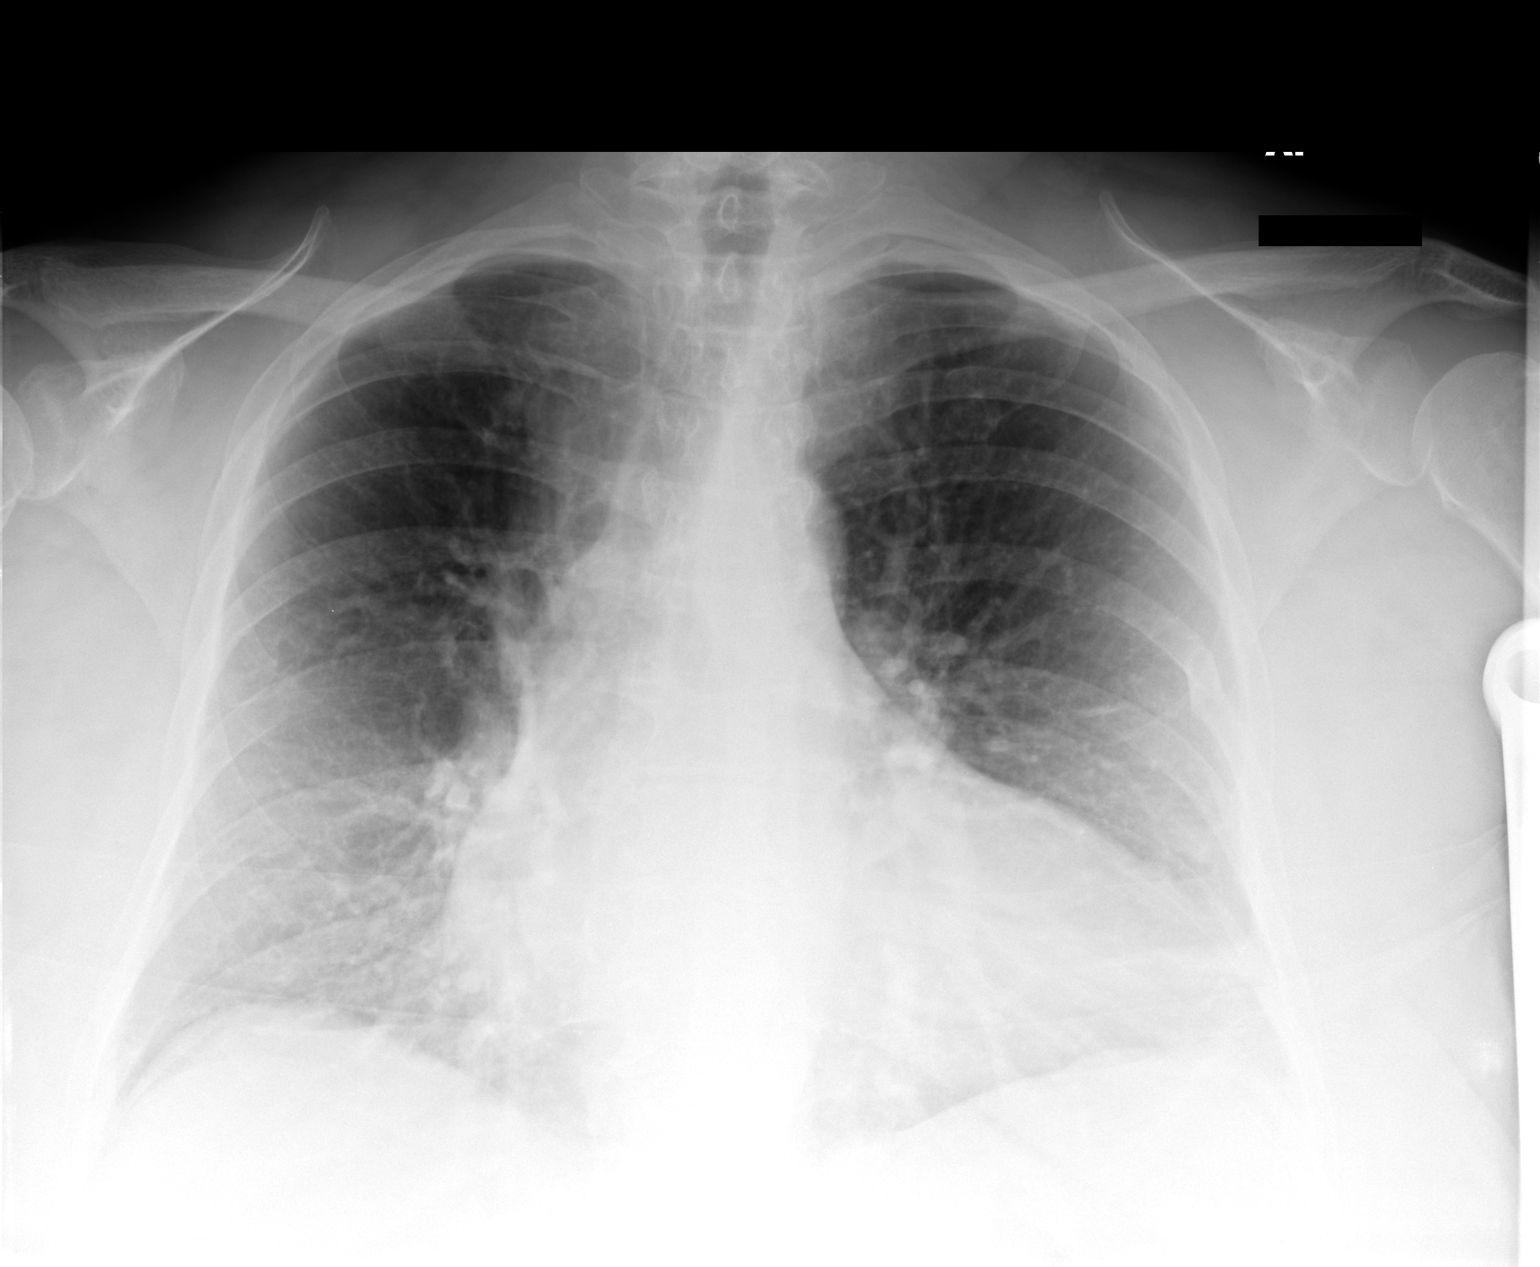

[1 of 1 positions shown; findings below may reference images not displayed]

FINDINGS: Cardiomegaly again noted.  No pulmonary edema.  Stable
old fracture of the left sixth rib.  There is streaky right base
medially atelectasis or infiltrate.  Linear atelectasis or scarring
left base.
IMPRESSION: No pulmonary edema.   Stable old fracture of the left
sixth rib.  There is streaky right base medially atelectasis or
infiltrate.  Linear atelectasis or scarring left base.]

## 2013-03-01 ENCOUNTER — Ambulatory Visit: Payer: Medicare Other | Admitting: Internal Medicine

## 2013-03-01 ENCOUNTER — Telehealth: Payer: Self-pay

## 2013-03-01 DIAGNOSIS — Z0289 Encounter for other administrative examinations: Secondary | ICD-10-CM

## 2013-03-01 MED ORDER — PANTOPRAZOLE SODIUM 40 MG PO TBEC: DELAYED_RELEASE_TABLET | ORAL | Status: DC

## 2013-03-01 NOTE — Telephone Encounter (Signed)
refill 

## 2013-03-02 ENCOUNTER — Other Ambulatory Visit: Payer: Self-pay

## 2013-03-02 DIAGNOSIS — E1142 Type 2 diabetes mellitus with diabetic polyneuropathy: Secondary | ICD-10-CM | POA: Diagnosis not present

## 2013-03-02 DIAGNOSIS — E1149 Type 2 diabetes mellitus with other diabetic neurological complication: Secondary | ICD-10-CM

## 2013-03-02 DIAGNOSIS — IMO0001 Reserved for inherently not codable concepts without codable children: Secondary | ICD-10-CM | POA: Diagnosis not present

## 2013-03-02 DIAGNOSIS — N184 Chronic kidney disease, stage 4 (severe): Secondary | ICD-10-CM

## 2013-03-02 MED ORDER — DULOXETINE HCL 60 MG PO CPEP: 120.0000 mg | ORAL_CAPSULE | Freq: Every day | ORAL | Status: DC

## 2013-03-02 NOTE — Telephone Encounter (Signed)
Refill for Cymbalta sent to [facility]

## 2013-03-09 ENCOUNTER — Other Ambulatory Visit: Payer: Self-pay | Admitting: Internal Medicine

## 2013-03-16 ENCOUNTER — Ambulatory Visit (INDEPENDENT_AMBULATORY_CARE_PROVIDER_SITE_OTHER): Payer: Medicare Other | Admitting: Internal Medicine

## 2013-03-16 ENCOUNTER — Encounter: Payer: Self-pay | Admitting: Physical Medicine and Rehabilitation

## 2013-03-16 ENCOUNTER — Encounter: Payer: Self-pay | Admitting: Internal Medicine

## 2013-03-16 ENCOUNTER — Encounter
Payer: Medicare Other | Attending: Physical Medicine and Rehabilitation | Admitting: Physical Medicine and Rehabilitation

## 2013-03-16 VITALS — BP 123/45 | HR 94 | Resp 16 | Ht 69.0 in | Wt 279.0 lb

## 2013-03-16 VITALS — BP 120/70 | HR 87 | Temp 98.9°F

## 2013-03-16 DIAGNOSIS — G547 Phantom limb syndrome without pain: Secondary | ICD-10-CM | POA: Insufficient documentation

## 2013-03-16 DIAGNOSIS — R609 Edema, unspecified: Secondary | ICD-10-CM | POA: Diagnosis not present

## 2013-03-16 DIAGNOSIS — G609 Hereditary and idiopathic neuropathy, unspecified: Secondary | ICD-10-CM | POA: Insufficient documentation

## 2013-03-16 DIAGNOSIS — L02419 Cutaneous abscess of limb, unspecified: Secondary | ICD-10-CM | POA: Insufficient documentation

## 2013-03-16 DIAGNOSIS — M069 Rheumatoid arthritis, unspecified: Secondary | ICD-10-CM | POA: Insufficient documentation

## 2013-03-16 DIAGNOSIS — S88119A Complete traumatic amputation at level between knee and ankle, unspecified lower leg, initial encounter: Secondary | ICD-10-CM | POA: Diagnosis not present

## 2013-03-16 DIAGNOSIS — L03116 Cellulitis of left lower limb: Secondary | ICD-10-CM

## 2013-03-16 DIAGNOSIS — G894 Chronic pain syndrome: Secondary | ICD-10-CM

## 2013-03-16 DIAGNOSIS — Z79899 Other long term (current) drug therapy: Secondary | ICD-10-CM | POA: Insufficient documentation

## 2013-03-16 DIAGNOSIS — M1710 Unilateral primary osteoarthritis, unspecified knee: Secondary | ICD-10-CM | POA: Insufficient documentation

## 2013-03-16 DIAGNOSIS — Z89511 Acquired absence of right leg below knee: Secondary | ICD-10-CM

## 2013-03-16 MED ORDER — CEPHALEXIN 500 MG PO CAPS: 500.0000 mg | ORAL_CAPSULE | Freq: Four times a day (QID) | ORAL | Status: DC

## 2013-03-16 MED ORDER — OXYCODONE-ACETAMINOPHEN 10-325 MG PO TABS: 1.0000 | ORAL_TABLET | ORAL | Status: DC | PRN

## 2013-03-16 MED ORDER — FENTANYL 75 MCG/HR TD PT72: 1.0000 | MEDICATED_PATCH | TRANSDERMAL | Status: DC

## 2013-03-16 NOTE — Progress Notes (Signed)
Subjective:    Patient ID: Crystal Fitzpatrick, female    DOB: 1945/07/14, 67 y.o.   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 After lymph edema in 2010 she was not able to wear a prosthetic leg anymore. Since then she was in a wheelchair.  The patient is doing exercises she learned from PT, and she is doing some yoga.  She states, that she is released from the wound care center,where she went  for her small open wound on her left hip.   The patient reports that she was in the hospital in [place] from Sunday 02/12/13 to Wednesday 02/15/13, she states that she lost consciuosness, her aid found her and she was admitted to the hospital, she states that she had kidney problems at that time, but she states that her kidney function recovered fully after a couple of days.  Today the patient states, that her left lower leg swell up and that she has a blister on her left lower leg.  Pain Inventory Average Pain 4 Pain Right Now 6 My pain is sharp, burning, stabbing and aching  In the last 24 hours, has pain interfered with the following? General activity 3 Relation with others 3 Enjoyment of life 1 What TIME of day is your pain at its worst? morning and night Sleep (in general) Fair  Pain is worse with: sitting, inactivity and standing Pain improves with: pacing activities Relief from Meds: 7  Mobility use a wheelchair needs help with transfers  Function I need assistance with the following:  shopping  Neuro/Psych No problems in this area  Prior Studies Any changes since last visit?  no  Physicians involved in your care Any changes since last visit?  no   Family History  Problem Relation Age of Onset  . Breast cancer Sister   . Diabetes Other   . Stroke Other     Grandparents   History   Social History  . Marital Status: Divorced    Spouse Name: N/A    Number of Children: 1  . Years of Education: N/A   Occupational  History  . retired Marketing executive    Social History Main Topics  . Smoking status: Former Games developer  . Smokeless tobacco: Never Used  . Alcohol Use: No  . Drug Use: No  . Sexual Activity: None   Other Topics Concern  . None   Social History Narrative  . None   Past Surgical History  Procedure Laterality Date  . Amputation  02/02/2008    below right knee  . S/p egd  2007    with Botox for? Achalasia  . Tonsillectomy    . S/p breast biopsy  1992  . 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 Tubal ligation    . Nasal septum surgery    . Foot surgury  1980 and 1981   Past Medical History  Diagnosis Date  . DIABETES MELLITUS, TYPE II 07/17/2008  . B12 DEFICIENCY 07/17/2008  . Acute gouty arthropathy 12/11/2008  . HYPONATREMIA 03/20/2010  . HYPERKALEMIA 10/31/2009  . ANEMIA-NOS 07/17/2008  . ANXIETY 07/17/2008  . Chronic pain syndrome 02/14/2010  . Trigeminal neuralgia 02/14/2010  . HEARING LOSS, RIGHT EAR 06/04/2009  . HYPERTENSION 07/17/2008  . CHF 01/28/2009  . Pneumonia, organism unspecified 02/14/2010  . GERD 07/17/2008  . IBS 06/04/2009  . UNSPECIFIED HEPATITIS 01/28/2009  . RENAL INSUFFICIENCY 10/31/2009  .  MENOPAUSAL DISORDER 12/03/2009  . Cellulitis and abscess of leg, except foot 11/15/2008  . SKIN ULCER, CHRONIC 02/08/2009  . Rheumatoid arthritis(714.0) 07/17/2008  . SKIN LESION 06/04/2009  . ARTHRITIS 01/28/2009  . FOOT PAIN 07/17/2008  . HYPERSOMNIA 02/14/2010  . Altered mental status 02/07/2010  . PERIPHERAL EDEMA 08/30/2008  . Wheezing 12/03/2009  . ABDOMINAL DISTENSION 10/11/2008  . Dysuria 11/11/2009  . Hypoxemia 03/20/2010  . ANAPHYLACTIC SHOCK 05/15/2009  . NEUROPATHY, HX OF 01/28/2009  . COLONIC POLYPS, HX OF 07/17/2008  . AMPUTATION, BELOW KNEE, RIGHT, HX OF 01/28/2009  . Hyperlipemia 09/24/2010  . Hyperlipidemia 09/24/2010  . Seizure 10/26/2010  . Pneumonia, aspiration 12/01/2011    Episode July 2013, tx per [facility]   BP 123/45  Pulse 94   Resp 16  Ht 5\' 9"  (1.753 m)  Wt 279 lb (126.554 kg)  BMI 41.18 kg/m2  SpO2 92%   4   Review of Systems  Neurological: Positive for headaches.       Neuropathy getting worse  All other systems reviewed and are negative.       Objective:   Physical Exam Constitutional: She appears well-developed and well-nourished.  HENT:  Head: Normocephalic and atraumatic.  Nose: Nose normal.  Eyes: Conjunctivae are normal.  Neck: Normal range of motion. Neck supple.  Pulmonary/Chest: No respiratory distress.  Patient in a wheel chair  Skin : left lower leg is swollen, red, painful, she also has an open blister of about 1cm diameter, and there is some oozing. Musculoskeletal:   Weight is stable but she remains morbidly obese.  Extension deficit of left knee about 15 degrees, flexion 110 degrees  Strength grossly intact, except right lower leg.        Assessment & Plan:  1. Right below-knee amputation, history of phantom limb pain.  2. Peripheral neuropathy.  3. History of rheumatoid arthritis.  4. Osteoarthritis of left knee-improved after synvisc  5. Chronic cellulitis, left leg with associated edema, treated by the wound clinic with Ax and rest.  6. Left patella fx (per pt) after fall at home 5 weeks ago, she saw Dr. Marva Panda, orthopedic surgeon in [place], he diagnosed her with a fissure in the left patella, and with some cysts, per patient, patient will call his office to send [place] the images and his notes.  7.Most likely cellulitis of left lower leg, tried to get her into wound care today, which did not work, the advised her to go to her PCP or the ED today. Her aid called her PCP and she got an appointment today at 1pm, I strongly advised her to go to this appointment. 8. The patient  was in the hospital in [place] from Sunday 02/12/13 to Wednesday 02/15/13, she stated that she lost consciuosness, her aid found her and she was admitted to the hospital, she stated that she had kidney  problems at that time, but she states that her kidney function recovered fully after a couple of days. Unfortunately we do not have any notes of this stay.  PLAN:  1. Continue compression stocking to right leg. Advised patient to continue with the exercises she learned from Pt.  2. Continue wound care per the [place] wound care clinic, her open wound has opened up again, unfortunately. She is following up with wound care again.Consider aquatic therapy when wound has healed.  3.Refilled her pain medications today including Percocet 10/325, #120  and fentanyl 75 mcg #10,  -goal will be  to taper meds  6.She will follow up with PA in one month.  Consider aquatic therapy, when wound care center approves.  She is asking whether we could order an electric wheel chair. I think it would be appropriate for her to get around, putting weight on her left LE is still painful, we can not really strengthen the left LE, because she still has an open wound, and can not do the aquatic exercises, which I think would be the best way in her situation to strengthen her LLE muscles, and balance without putting to much stress on her left knee joint. Consider ordering an electric wheel chair, after patient finds out what kind of chair, and contacts a company .

## 2013-03-16 NOTE — Progress Notes (Signed)
Subjective:    Patient ID: Crystal Fitzpatrick, female    DOB: 06/06/1945, 67 y.o.   MRN: [id number]  HPI  Pt presents to the clinic today with c/o redness and swelling of the LLE. She was seen by Dr. Hermelinda Medicus this am for a follow up. He was concerned about her leg and tried to get her into the wound clinic today where she is being treated for chronic LLE cellulitis secondary to lymphedema. He was not able to get her an appt there and advised her to follow up with her PCP.  Review of Systems      Past Medical History  Diagnosis Date  . DIABETES MELLITUS, TYPE II 07/17/2008  . B12 DEFICIENCY 07/17/2008  . Acute gouty arthropathy 12/11/2008  . HYPONATREMIA 03/20/2010  . HYPERKALEMIA 10/31/2009  . ANEMIA-NOS 07/17/2008  . ANXIETY 07/17/2008  . Chronic pain syndrome 02/14/2010  . Trigeminal neuralgia 02/14/2010  . HEARING LOSS, RIGHT EAR 06/04/2009  . HYPERTENSION 07/17/2008  . CHF 01/28/2009  . Pneumonia, organism unspecified 02/14/2010  . GERD 07/17/2008  . IBS 06/04/2009  . UNSPECIFIED HEPATITIS 01/28/2009  . RENAL INSUFFICIENCY 10/31/2009  . MENOPAUSAL DISORDER 12/03/2009  . Cellulitis and abscess of leg, except foot 11/15/2008  . SKIN ULCER, CHRONIC 02/08/2009  . Rheumatoid arthritis(714.0) 07/17/2008  . SKIN LESION 06/04/2009  . ARTHRITIS 01/28/2009  . FOOT PAIN 07/17/2008  . HYPERSOMNIA 02/14/2010  . Altered mental status 02/07/2010  . PERIPHERAL EDEMA 08/30/2008  . Wheezing 12/03/2009  . ABDOMINAL DISTENSION 10/11/2008  . Dysuria 11/11/2009  . Hypoxemia 03/20/2010  . ANAPHYLACTIC SHOCK 05/15/2009  . NEUROPATHY, HX OF 01/28/2009  . COLONIC POLYPS, HX OF 07/17/2008  . AMPUTATION, BELOW KNEE, RIGHT, HX OF 01/28/2009  . Hyperlipemia 09/24/2010  . Hyperlipidemia 09/24/2010  . Seizure 10/26/2010  . Pneumonia, aspiration 12/01/2011    Episode July 2013, tx per [facility]    Current Outpatient Prescriptions  Medication Sig Dispense Refill  . albuterol (PROAIR HFA) 108 (90 BASE) MCG/ACT inhaler Inhale 2  puffs into the lungs every 6 (six) hours as needed.        . Ascorbic Acid (VITAMIN C) 500 MG tablet Take 500 mg by mouth daily.        [name] BENICAR 40 MG tablet TAKE 1 TABLET BY MOUTH EVERY DAY FOR BLOOD PRESSURE  30 tablet  9  . Blood Glucose Monitoring Suppl (ONE TOUCH ULTRA 2) W/DEVICE KIT 1 Device by Does not apply route once.  1 each  0  . Blood Glucose Monitoring Suppl (ONE TOUCH ULTRA SYSTEM KIT) W/DEVICE KIT 1 kit by Does not apply route once.  1 each  0  . Cholecalciferol (VITAMIN D) 400 UNITS capsule Take 400 Units by mouth daily.        . cyclobenzaprine (FLEXERIL) 10 MG tablet Take 1 tablet (10 mg total) by mouth every 8 (eight) hours as needed.  90 tablet  5  . diclofenac sodium (VOLTAREN) 1 % GEL Apply 2 g topically 4 (four) times daily.  3 Tube  3  . diphenoxylate-atropine (LOMOTIL) 2.5-0.025 MG per tablet TAKE ONE TABLET TWICE DAILY AS NEEDED  60 tablet  2  . DULoxetine (CYMBALTA) 60 MG capsule Take 2 capsules (120 mg total) by mouth daily.  60 capsule  5  . EPINEPHrine (EPIPEN 2-PAK) 0.3 mg/0.3 mL DEVI Inject 0.3 mLs (0.3 mg total) into the muscle once. Use as directed  1 Device  0  . fentaNYL (DURAGESIC - DOSED MCG/HR) 75  MCG/HR Place 1 patch (75 mcg total) onto the skin every 3 (three) days. 1 patch every 3 days  10 patch  0  . furosemide (LASIX) 20 MG tablet TAKE 1 TABLET BY MOUTH EVERY DAY  30 tablet  1  . gabapentin (NEURONTIN) 600 MG tablet Take 1 tablet (600 mg total) by mouth 3 (three) times daily.  90 tablet  3  . levofloxacin (LEVAQUIN) 500 MG tablet       . lidocaine (LIDODERM) 5 % Place 1 patch onto the skin. Remove & Discard patch within 12 hours or as directed by MD       . loperamide (ANTI-DIARRHEAL) 2 MG tablet Take 2 mg by mouth 2 (two) times daily as needed.        . meclizine (ANTIVERT) 25 MG tablet Take 1 tablet (25 mg total) by mouth 3 (three) times daily as needed.  30 tablet  0  . Multiple Vitamins-Minerals (CENTRUM SILVER ULTRA WOMENS PO) Take by mouth daily.         . Omega-3 Fatty Acids (FISH OIL) 1000 MG CAPS Take by mouth daily.        Letta Pate DELICA LANCETS MISC Use as directed to test blood sugar dx 250.00  100 each  2  . oxyCODONE-acetaminophen (PERCOCET) 10-325 MG per tablet Take 1 tablet by mouth every 4 (four) hours as needed for pain.  120 tablet  0  . pantoprazole (PROTONIX) 40 MG tablet TAKE ONE (1) TABLET BY MOUTH EVERY DAY  FOR ACID REFLUX  30 tablet  11  . promethazine (PHENERGAN) 25 MG tablet TAKE 1 TABLET BY MOUTH EVERY 6 HOURS AS NEEDED FOR NAUSEA  60 tablet  1  . Psyllium (METAMUCIL) 30.9 % POWD Take by mouth. Take as directed       . topiramate (TOPAMAX) 100 MG tablet TAKE 3 TABLETS BY MOUTH EACH NIGHT AT BEDTIME  90 tablet  3   No current facility-administered medications for this visit.    Allergies  Allergen Reactions  . Morphine Anaphylaxis  . Sulfonamide Derivatives Diarrhea  . Ciprofloxacin Diarrhea  . Doxycycline     Nausea, diarrhea  . Nitrofurantoin     REACTION: itch  . Prednisone     Fast heart rate  . Erythromycin Rash    Family History  Problem Relation Age of Onset  . Breast cancer Sister   . Diabetes Other   . Stroke Other     Grandparents    History   Social History  . Marital Status: Divorced    Spouse Name: N/A    Number of Children: 1  . Years of Education: N/A   Occupational History  . retired Marketing executive    Social History Main Topics  . Smoking status: Former Games developer  . Smokeless tobacco: Never Used  . Alcohol Use: No  . Drug Use: No  . Sexual Activity: Not on file   Other Topics Concern  . Not on file   Social History Narrative  . No narrative on file     Constitutional: Denies fever, malaise, fatigue, headache or abrupt weight changes.  Respiratory: Denies difficulty breathing, shortness of breath, cough or sputum production.   Cardiovascular: Pt reports swelling of the LLE. Denies chest pain, chest tightness, palpitations or swelling in the hands.   Skin: Pt reports redness and swelling of LLE. Denies rashes, lesions or ulcercations.   No other specific complaints in a complete review of systems (except as listed in HPI  above).  Objective:   Physical Exam   BP 120/70  Pulse 87  Temp(Src) 98.9 F (37.2 C) (Oral)  SpO2 91% Wt Readings from Last 3 Encounters:  03/16/13 279 lb (126.554 kg)  02/16/13 279 lb (126.554 kg)  01/18/13 279 lb (126.554 kg)    General: Appears her stated age, obese but well developed, well nourished in NAD. Skin: Warm, dry and intact. Cellulitis noted on LLE. Cardiovascular: Normal rate and rhythm. S1,S2 noted.  No murmur, rubs or gallops noted. No JVD, 4+ swelling of LLE. No carotid bruits noted. Pulmonary/Chest: Normal effort and positive vesicular breath sounds. No respiratory distress. No wheezes, rales or ronchi noted.    BMET    Component Value Date/Time   NA 138 01/12/2013 1152   K 4.8 01/12/2013 1152   CL 108 01/12/2013 1152   CO2 24 01/12/2013 1152   GLUCOSE 78 01/12/2013 1152   BUN 16 01/12/2013 1152   CREATININE 1.2 01/12/2013 1152   CALCIUM 9.1 01/12/2013 1152   GFRNONAA 42.77 03/20/2010 1652   GFRAA  Value: 51        The eGFR has been calculated using the MDRD equation. This calculation has not been validated in all clinical situations. eGFR's persistently <60 mL/min signify possible Chronic Kidney Disease.* 02/13/2010 0425    Lipid Panel     Component Value Date/Time   CHOL 212* 12/12/2012 1113   TRIG 272.0* 12/12/2012 1113   HDL 36.70* 12/12/2012 1113   CHOLHDL 6 12/12/2012 1113   VLDL 54.4* 12/12/2012 1113   LDLCALC  Value: 104        Total Cholesterol/HDL:CHD Risk Coronary Heart Disease Risk Table                     Men   Women  1/2 Average Risk   3.4   3.3  Average Risk       5.0   4.4  2 X Average Risk   9.6   7.1  3 X Average Risk  23.4   11.0        Use the calculated Patient Ratio above and the CHD Risk Table to determine the patient's CHD Risk.        ATP III CLASSIFICATION (LDL):   <100     mg/dL   Optimal  161-096  mg/dL   Near or Above                    Optimal  130-159  mg/dL   Borderline  045-409  mg/dL   High  >811     mg/dL   Very High* 01/30/7828 0335    CBC    Component Value Date/Time   WBC 5.9 12/08/2010 1002   RBC 3.36* 12/08/2010 1002   RBC 2.77* 02/08/2010 0335   HGB 10.5* 12/08/2010 1002   HCT 30.9* 12/08/2010 1002   PLT 237.0 12/08/2010 1002   MCV 91.8 12/08/2010 1002   MCH 31.5 02/13/2010 0425   MCHC 34.0 12/08/2010 1002   RDW 13.6 12/08/2010 1002   LYMPHSABS 1.7 12/08/2010 1002   MONOABS 0.5 12/08/2010 1002   EOSABS 0.4 12/08/2010 1002   BASOSABS 0.0 12/08/2010 1002    Hgb A1C Lab Results  Component Value Date   HGBA1C 6.5 12/12/2012        Assessment & Plan:   Cellulitis of LLE, recurrent:  Pt allergic to Septra, Doxy- will give eRX for Keflex 5000 mg QID x 10 days Take a probiotic  while you on the abx Let me know if swelling, redness worsens  RTC as needed or if symptoms persist

## 2013-03-16 NOTE — Patient Instructions (Signed)
Cellulitis Cellulitis is an infection of the skin and the tissue beneath it. The infected area is usually red and tender. Cellulitis occurs most often in the arms and lower legs.  CAUSES  Cellulitis is caused by bacteria that enter the skin through cracks or cuts in the skin. The most common types of bacteria that cause cellulitis are Staphylococcus and Streptococcus. SYMPTOMS   Redness and warmth.  Swelling.  Tenderness or pain.  Fever. DIAGNOSIS  Your caregiver can usually determine what is wrong based on a physical exam. Blood tests may also be done. TREATMENT  Treatment usually involves taking an antibiotic medicine. HOME CARE INSTRUCTIONS   Take your antibiotics as directed. Finish them even if you start to feel better.  Keep the infected arm or leg elevated to reduce swelling.  Apply a warm cloth to the affected area up to 4 times per day to relieve pain.  Only take over-the-counter or prescription medicines for pain, discomfort, or fever as directed by your caregiver.  Keep all follow-up appointments as directed by your caregiver. SEEK MEDICAL CARE IF:   You notice red streaks coming from the infected area.  Your red area gets larger or turns dark in color.  Your bone or joint underneath the infected area becomes painful after the skin has healed.  Your infection returns in the same area or another area.  You notice a swollen bump in the infected area.  You develop new symptoms. SEEK IMMEDIATE MEDICAL CARE IF:   You have a fever.  You feel very sleepy.  You develop vomiting or diarrhea.  You have a general ill feeling (malaise) with muscle aches and pains. MAKE SURE YOU:   Understand these instructions.  Will watch your condition.  Will get help right away if you are not doing well or get worse. Document Released: 02/11/2005 Document Revised: 11/03/2011 Document Reviewed: 07/20/2011 Exit[employer] Patient Information 2014 [employer].  

## 2013-03-16 NOTE — Patient Instructions (Addendum)
Go to your PCP today for your swollen, red, lower leg today ! Patient got appointment at 1 pm today.

## 2013-03-21 DIAGNOSIS — G4733 Obstructive sleep apnea (adult) (pediatric): Secondary | ICD-10-CM | POA: Diagnosis not present

## 2013-03-21 DIAGNOSIS — R0902 Hypoxemia: Secondary | ICD-10-CM | POA: Diagnosis not present

## 2013-03-23 ENCOUNTER — Other Ambulatory Visit: Payer: Self-pay

## 2013-03-31 ENCOUNTER — Other Ambulatory Visit: Payer: Self-pay | Admitting: Physical Medicine & Rehabilitation

## 2013-03-31 ENCOUNTER — Other Ambulatory Visit: Payer: Self-pay | Admitting: Internal Medicine

## 2013-03-31 DIAGNOSIS — G40909 Epilepsy, unspecified, not intractable, without status epilepticus: Secondary | ICD-10-CM | POA: Diagnosis not present

## 2013-03-31 DIAGNOSIS — E669 Obesity, unspecified: Secondary | ICD-10-CM | POA: Diagnosis not present

## 2013-03-31 DIAGNOSIS — I509 Heart failure, unspecified: Secondary | ICD-10-CM | POA: Diagnosis not present

## 2013-03-31 DIAGNOSIS — G4730 Sleep apnea, unspecified: Secondary | ICD-10-CM | POA: Diagnosis not present

## 2013-03-31 DIAGNOSIS — R0602 Shortness of breath: Secondary | ICD-10-CM | POA: Diagnosis not present

## 2013-04-18 ENCOUNTER — Encounter
Payer: Medicare Other | Attending: Physical Medicine and Rehabilitation | Admitting: Physical Medicine and Rehabilitation

## 2013-04-18 ENCOUNTER — Encounter: Payer: Self-pay | Admitting: Physical Medicine and Rehabilitation

## 2013-04-18 ENCOUNTER — Other Ambulatory Visit: Payer: Self-pay | Admitting: *Deleted

## 2013-04-18 VITALS — BP 125/71 | HR 99 | Resp 14 | Ht 69.0 in | Wt 279.0 lb

## 2013-04-18 DIAGNOSIS — G4737 Central sleep apnea in conditions classified elsewhere: Secondary | ICD-10-CM | POA: Insufficient documentation

## 2013-04-18 DIAGNOSIS — S88119A Complete traumatic amputation at level between knee and ankle, unspecified lower leg, initial encounter: Secondary | ICD-10-CM

## 2013-04-18 DIAGNOSIS — L02419 Cutaneous abscess of limb, unspecified: Secondary | ICD-10-CM | POA: Diagnosis not present

## 2013-04-18 DIAGNOSIS — M1710 Unilateral primary osteoarthritis, unspecified knee: Secondary | ICD-10-CM | POA: Insufficient documentation

## 2013-04-18 DIAGNOSIS — Z5181 Encounter for therapeutic drug level monitoring: Secondary | ICD-10-CM | POA: Diagnosis not present

## 2013-04-18 DIAGNOSIS — Z8781 Personal history of (healed) traumatic fracture: Secondary | ICD-10-CM | POA: Insufficient documentation

## 2013-04-18 DIAGNOSIS — G547 Phantom limb syndrome without pain: Secondary | ICD-10-CM | POA: Diagnosis not present

## 2013-04-18 DIAGNOSIS — G894 Chronic pain syndrome: Secondary | ICD-10-CM

## 2013-04-18 DIAGNOSIS — Z79899 Other long term (current) drug therapy: Secondary | ICD-10-CM | POA: Diagnosis not present

## 2013-04-18 DIAGNOSIS — Z89511 Acquired absence of right leg below knee: Secondary | ICD-10-CM

## 2013-04-18 DIAGNOSIS — M069 Rheumatoid arthritis, unspecified: Secondary | ICD-10-CM | POA: Insufficient documentation

## 2013-04-18 DIAGNOSIS — R609 Edema, unspecified: Secondary | ICD-10-CM | POA: Insufficient documentation

## 2013-04-18 DIAGNOSIS — G609 Hereditary and idiopathic neuropathy, unspecified: Secondary | ICD-10-CM | POA: Insufficient documentation

## 2013-04-18 MED ORDER — OXYCODONE-ACETAMINOPHEN 10-325 MG PO TABS: 1.0000 | ORAL_TABLET | ORAL | Status: DC | PRN

## 2013-04-18 MED ORDER — FENTANYL 75 MCG/HR TD PT72: 75.0000 ug | MEDICATED_PATCH | TRANSDERMAL | Status: DC

## 2013-04-18 NOTE — Patient Instructions (Signed)
Stay as active as tolerated. 

## 2013-04-18 NOTE — Progress Notes (Signed)
Subjective:    Patient ID: Crystal Fitzpatrick, female    DOB: 10/24/1945, 67 y.o.   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 After lymph edema in 2010 she was not able to wear a prosthetic leg anymore. Since then she was in a wheelchair.  The patient is doing exercises she learned from PT, and she is doing some yoga.  She states, that she is released from the wound care center,where she went for her small open wound on her left hip.  The patient reports that she did a sleep study and was dx with sleep apnea, she brought the results with her today.   Pain Inventory Average Pain 7 Pain Right Now 7 My pain is sharp and shooting  In the last 24 hours, has pain interfered with the following? General activity 3 Relation with others 2 Enjoyment of life 2 What TIME of day is your pain at its worst? night Sleep (in general) Fair  Pain is worse with: sitting, inactivity and unsure Pain improves with: rest, medication and yoga Relief from Meds: 5  Mobility use a wheelchair needs help with transfers  Function disabled: date disabled . I need assistance with the following:  meal prep, household duties and shopping  Neuro/Psych No problems in this area  Prior Studies Any changes since last visit?  no  Physicians involved in your care Any changes since last visit?  no   Family History  Problem Relation Age of Onset  . Breast cancer Sister   . Diabetes Other   . Stroke Other     Grandparents   History   Social History  . Marital Status: Divorced    Spouse Name: N/A    Number of Children: 1  . Years of Education: N/A   Occupational History  . retired Marketing executive    Social History Main Topics  . Smoking status: Former Games developer  . Smokeless tobacco: Never Used  . Alcohol Use: No  . Drug Use: No  . Sexual Activity: None   Other Topics Concern  . None   Social History Narrative  . None   Past  Surgical History  Procedure Laterality Date  . Amputation  02/02/2008    below right knee  . S/p egd  2007    with Botox for? Achalasia  . Tonsillectomy    . S/p breast biopsy  1992  . 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 Tubal ligation    . Nasal septum surgery    . Foot surgury  1980 and 1981   Past Medical History  Diagnosis Date  . DIABETES MELLITUS, TYPE II 07/17/2008  . B12 DEFICIENCY 07/17/2008  . Acute gouty arthropathy 12/11/2008  . HYPONATREMIA 03/20/2010  . HYPERKALEMIA 10/31/2009  . ANEMIA-NOS 07/17/2008  . ANXIETY 07/17/2008  . Chronic pain syndrome 02/14/2010  . Trigeminal neuralgia 02/14/2010  . HEARING LOSS, RIGHT EAR 06/04/2009  . HYPERTENSION 07/17/2008  . CHF 01/28/2009  . Pneumonia, organism unspecified 02/14/2010  . GERD 07/17/2008  . IBS 06/04/2009  . UNSPECIFIED HEPATITIS 01/28/2009  . RENAL INSUFFICIENCY 10/31/2009  . MENOPAUSAL DISORDER 12/03/2009  . Cellulitis and abscess of leg, except foot 11/15/2008  . SKIN ULCER, CHRONIC 02/08/2009  . Rheumatoid arthritis(714.0) 07/17/2008  . SKIN LESION 06/04/2009  . ARTHRITIS 01/28/2009  . FOOT PAIN 07/17/2008  . HYPERSOMNIA 02/14/2010  . Altered mental status 02/07/2010  . PERIPHERAL EDEMA  08/30/2008  . Wheezing 12/03/2009  . ABDOMINAL DISTENSION 10/11/2008  . Dysuria 11/11/2009  . Hypoxemia 03/20/2010  . ANAPHYLACTIC SHOCK 05/15/2009  . NEUROPATHY, HX OF 01/28/2009  . COLONIC POLYPS, HX OF 07/17/2008  . AMPUTATION, BELOW KNEE, RIGHT, HX OF 01/28/2009  . Hyperlipemia 09/24/2010  . Hyperlipidemia 09/24/2010  . Seizure 10/26/2010  . Pneumonia, aspiration 12/01/2011    Episode July 2013, tx per [facility]   BP 125/71  Pulse 99  Resp 14  Ht 5\' 9"  (1.753 m)  Wt 279 lb (126.554 kg)  BMI 41.18 kg/m2  SpO2 93%   Review of Systems  All other systems reviewed and are negative.       Objective:   Physical Exam Constitutional: She appears well-developed and well-nourished.  HENT:  Head: Normocephalic and  atraumatic.  Nose: Nose normal.  Eyes: Conjunctivae are normal.  Neck: Normal range of motion. Neck supple.  Pulmonary/Chest: No respiratory distress.  Patient in a wheel chair  Skin : left lower leg is swollen, red, painful, she also has an open blister of about 1cm diameter, and there is some oozing. Musculoskeletal:   Weight is stable but she remains morbidly obese.  Extension deficit of left knee about 15 degrees, flexion 110 degrees  Strength grossly intact, except right lower leg.        Assessment & Plan:  1. Right below-knee amputation, history of phantom limb pain.  2. Peripheral neuropathy.  3. History of rheumatoid arthritis.  4. Osteoarthritis of left knee-improved after synvisc  5. Chronic cellulitis, left leg with associated edema, treated by the wound clinic with Ax and rest.  6. Left patella fx (per pt) after fall at home 5 weeks ago, she saw Dr. Marva Panda, orthopedic surgeon in [place], he diagnosed her with a fissure in the left patella, and with some cysts, per patient, patient will call his office to send [place] the images and his notes.  7. Patient was dx with central sleep apnea, will receive a C-PAP with O2 PLAN:  1. Continue compression stocking to right leg. Advised patient to continue with the exercises she learned from Pt.  2. Continue wound care per the [place] wound care clinic, her open wound has opened up again, unfortunately. She is following up with wound care again.Consider aquatic therapy when wound has healed.  3.Refilled her pain medications today including Percocet 10/325, #120  and fentanyl 75 mcg #10,  -goal will be to taper meds  6.She will follow up with PA in one month.  Consider aquatic therapy, when wound care center approves.  She is asking whether we could order an electric wheel chair. I think it would be appropriate for her to get around, putting weight on her left LE is still painful, we can not really strengthen the left LE, because she still  has an open wound, and can not do the aquatic exercises, which I think would be the best way in her situation to strengthen her LLE muscles, and balance without putting to much stress on her left knee joint. Consider ordering an electric wheel chair, after patient finds out what kind of chair, and contacts a company .

## 2013-04-21 ENCOUNTER — Other Ambulatory Visit: Payer: Self-pay | Admitting: Internal Medicine

## 2013-05-08 ENCOUNTER — Other Ambulatory Visit: Payer: Self-pay | Admitting: *Deleted

## 2013-05-08 DIAGNOSIS — G609 Hereditary and idiopathic neuropathy, unspecified: Secondary | ICD-10-CM

## 2013-05-08 MED ORDER — OXYCODONE-ACETAMINOPHEN 10-325 MG PO TABS: 1.0000 | ORAL_TABLET | ORAL | Status: DC | PRN

## 2013-05-08 MED ORDER — FENTANYL 75 MCG/HR TD PT72: 75.0000 ug | MEDICATED_PATCH | TRANSDERMAL | Status: DC

## 2013-05-08 NOTE — Telephone Encounter (Signed)
RX printed early for controlled medication for the visit with RN on 05/19/13 (to be signed by MD) 

## 2013-05-12 ENCOUNTER — Other Ambulatory Visit: Payer: Self-pay

## 2013-05-12 MED ORDER — GABAPENTIN 600 MG PO TABS: 600.0000 mg | ORAL_TABLET | Freq: Three times a day (TID) | ORAL | Status: DC

## 2013-05-19 ENCOUNTER — Encounter: Payer: Medicare Other | Admitting: *Deleted

## 2013-05-22 ENCOUNTER — Encounter: Payer: Medicare Other | Attending: Physical Medicine & Rehabilitation | Admitting: *Deleted

## 2013-05-22 ENCOUNTER — Encounter: Payer: Self-pay | Admitting: *Deleted

## 2013-05-22 VITALS — BP 109/45 | HR 95 | Resp 14

## 2013-05-22 DIAGNOSIS — M47817 Spondylosis without myelopathy or radiculopathy, lumbosacral region: Secondary | ICD-10-CM | POA: Insufficient documentation

## 2013-05-22 DIAGNOSIS — R509 Fever, unspecified: Secondary | ICD-10-CM | POA: Diagnosis not present

## 2013-05-22 DIAGNOSIS — G894 Chronic pain syndrome: Secondary | ICD-10-CM | POA: Insufficient documentation

## 2013-05-22 DIAGNOSIS — G546 Phantom limb syndrome with pain: Secondary | ICD-10-CM

## 2013-05-22 DIAGNOSIS — G500 Trigeminal neuralgia: Secondary | ICD-10-CM | POA: Diagnosis not present

## 2013-05-22 DIAGNOSIS — Z79899 Other long term (current) drug therapy: Secondary | ICD-10-CM | POA: Insufficient documentation

## 2013-05-22 DIAGNOSIS — M47816 Spondylosis without myelopathy or radiculopathy, lumbar region: Secondary | ICD-10-CM

## 2013-05-22 NOTE — Progress Notes (Signed)
Here for pill count and medication refills. Percocet 10/325 # 120 Fill date 04/29/13     Today NV# 31  Fentanyl 75 mcg # 10  Today NV# 1  Pillcounts are appropriate.  VSS    Pain level:4  No change in medication list or pain levels.  She has been having fevers again of UKO but this has been going on for years for her and they have never given a definitive answer for it.  Follow up appt with RN in one month and [name] in 2 months

## 2013-05-22 NOTE — Patient Instructions (Signed)
Follow up one month with RN for pill count and med refill  And two months with [name]

## 2013-06-07 ENCOUNTER — Other Ambulatory Visit: Payer: Self-pay | Admitting: Internal Medicine

## 2013-06-08 ENCOUNTER — Other Ambulatory Visit: Payer: Self-pay

## 2013-06-08 MED ORDER — TOPIRAMATE 100 MG PO TABS: ORAL_TABLET | ORAL | Status: DC

## 2013-06-12 ENCOUNTER — Other Ambulatory Visit: Payer: Self-pay | Admitting: *Deleted

## 2013-06-12 DIAGNOSIS — G609 Hereditary and idiopathic neuropathy, unspecified: Secondary | ICD-10-CM

## 2013-06-12 MED ORDER — FENTANYL 75 MCG/HR TD PT72: 75.0000 ug | MEDICATED_PATCH | TRANSDERMAL | Status: DC

## 2013-06-12 MED ORDER — OXYCODONE-ACETAMINOPHEN 10-325 MG PO TABS: 1.0000 | ORAL_TABLET | ORAL | Status: DC | PRN

## 2013-06-12 NOTE — Telephone Encounter (Signed)
RX printed early for controlled medication for the visit with RN on 06/19/13 (to be signed by MD) 

## 2013-06-14 ENCOUNTER — Other Ambulatory Visit: Payer: Self-pay

## 2013-06-14 ENCOUNTER — Ambulatory Visit: Payer: Medicare Other | Admitting: Internal Medicine

## 2013-06-16 ENCOUNTER — Other Ambulatory Visit: Payer: Self-pay | Admitting: Internal Medicine

## 2013-06-16 NOTE — Telephone Encounter (Signed)
Last filled 04/21/13 with 2 refills--- please advise

## 2013-06-19 ENCOUNTER — Encounter: Payer: Medicare Other | Admitting: *Deleted

## 2013-06-22 DIAGNOSIS — H26499 Other secondary cataract, unspecified eye: Secondary | ICD-10-CM | POA: Diagnosis not present

## 2013-06-23 ENCOUNTER — Encounter: Payer: Self-pay | Admitting: *Deleted

## 2013-06-23 ENCOUNTER — Encounter: Payer: Medicare Other | Attending: Physical Medicine & Rehabilitation | Admitting: *Deleted

## 2013-06-23 VITALS — BP 122/58 | HR 74 | Resp 16

## 2013-06-23 DIAGNOSIS — E119 Type 2 diabetes mellitus without complications: Secondary | ICD-10-CM | POA: Diagnosis not present

## 2013-06-23 DIAGNOSIS — G4730 Sleep apnea, unspecified: Secondary | ICD-10-CM | POA: Diagnosis not present

## 2013-06-23 DIAGNOSIS — M1712 Unilateral primary osteoarthritis, left knee: Secondary | ICD-10-CM

## 2013-06-23 DIAGNOSIS — M069 Rheumatoid arthritis, unspecified: Secondary | ICD-10-CM | POA: Insufficient documentation

## 2013-06-23 DIAGNOSIS — G894 Chronic pain syndrome: Secondary | ICD-10-CM | POA: Insufficient documentation

## 2013-06-23 DIAGNOSIS — E785 Hyperlipidemia, unspecified: Secondary | ICD-10-CM | POA: Diagnosis not present

## 2013-06-23 DIAGNOSIS — I509 Heart failure, unspecified: Secondary | ICD-10-CM | POA: Diagnosis not present

## 2013-06-23 DIAGNOSIS — M79609 Pain in unspecified limb: Secondary | ICD-10-CM | POA: Diagnosis not present

## 2013-06-23 DIAGNOSIS — M1710 Unilateral primary osteoarthritis, unspecified knee: Secondary | ICD-10-CM | POA: Diagnosis not present

## 2013-06-23 DIAGNOSIS — I10 Essential (primary) hypertension: Secondary | ICD-10-CM | POA: Diagnosis not present

## 2013-06-23 DIAGNOSIS — M47816 Spondylosis without myelopathy or radiculopathy, lumbar region: Secondary | ICD-10-CM

## 2013-06-23 DIAGNOSIS — G546 Phantom limb syndrome with pain: Secondary | ICD-10-CM

## 2013-06-23 DIAGNOSIS — S88119A Complete traumatic amputation at level between knee and ankle, unspecified lower leg, initial encounter: Secondary | ICD-10-CM | POA: Diagnosis not present

## 2013-06-23 NOTE — Patient Instructions (Signed)
Keep appointment with [name] on 07/19/13!! They are hard to get!

## 2013-06-23 NOTE — Progress Notes (Signed)
Here for pill count and medication refills.  Percocet 10 325 # 120  Fill date 05/29/13    Today NV#  40 Fentanyl patches # 10 Fill date 05/27/13 Today NV# 0 VSS    Pain level:7  She had cataract surgery and had to go back and have some additional surgery.  Her pill count and patch counts are appropriate.  Refill given.  Her fall risk is high though she has had no recent falls.  I have discussed safety with her and given her a fall prevention in the home handout .  She will return in a month to see [name].

## 2013-07-14 ENCOUNTER — Telehealth: Payer: Self-pay | Admitting: *Deleted

## 2013-07-14 NOTE — Telephone Encounter (Signed)
Patient phoned stating she thinks she's having a reaction to her benicar--watched a tv commercial stating diarrhea was a problem & she's having intermittent diarrhea.  Please advise. Patient is willing to change bp meds.  CB# [phone]

## 2013-07-14 NOTE — Telephone Encounter (Signed)
No need, as benicar does not cause this (ok to disregard what she heard on the TV)

## 2013-07-15 NOTE — Telephone Encounter (Signed)
Called the patient informed of MD instructions on medication. 

## 2013-07-15 NOTE — Telephone Encounter (Signed)
Called left msg. To call back 

## 2013-07-19 ENCOUNTER — Encounter: Payer: Medicare Other | Attending: Physical Medicine and Rehabilitation | Admitting: Physical Medicine & Rehabilitation

## 2013-07-19 ENCOUNTER — Encounter: Payer: Self-pay | Admitting: Physical Medicine & Rehabilitation

## 2013-07-19 VITALS — BP 105/49 | HR 92 | Resp 14

## 2013-07-19 DIAGNOSIS — M47817 Spondylosis without myelopathy or radiculopathy, lumbosacral region: Secondary | ICD-10-CM | POA: Insufficient documentation

## 2013-07-19 DIAGNOSIS — G609 Hereditary and idiopathic neuropathy, unspecified: Secondary | ICD-10-CM | POA: Diagnosis not present

## 2013-07-19 DIAGNOSIS — M069 Rheumatoid arthritis, unspecified: Secondary | ICD-10-CM | POA: Diagnosis not present

## 2013-07-19 DIAGNOSIS — M1710 Unilateral primary osteoarthritis, unspecified knee: Secondary | ICD-10-CM

## 2013-07-19 DIAGNOSIS — S88119A Complete traumatic amputation at level between knee and ankle, unspecified lower leg, initial encounter: Secondary | ICD-10-CM | POA: Insufficient documentation

## 2013-07-19 DIAGNOSIS — IMO0002 Reserved for concepts with insufficient information to code with codable children: Secondary | ICD-10-CM | POA: Diagnosis not present

## 2013-07-19 DIAGNOSIS — G547 Phantom limb syndrome without pain: Secondary | ICD-10-CM | POA: Insufficient documentation

## 2013-07-19 DIAGNOSIS — M1712 Unilateral primary osteoarthritis, left knee: Secondary | ICD-10-CM

## 2013-07-19 DIAGNOSIS — M47816 Spondylosis without myelopathy or radiculopathy, lumbar region: Secondary | ICD-10-CM

## 2013-07-19 DIAGNOSIS — G546 Phantom limb syndrome with pain: Secondary | ICD-10-CM

## 2013-07-19 MED ORDER — FENTANYL 75 MCG/HR TD PT72: 75.0000 ug | MEDICATED_PATCH | TRANSDERMAL | Status: DC

## 2013-07-19 MED ORDER — OXYCODONE-ACETAMINOPHEN 10-325 MG PO TABS: 1.0000 | ORAL_TABLET | ORAL | Status: DC | PRN

## 2013-07-19 NOTE — Patient Instructions (Addendum)
PLEASE CALL ME WITH ANY PROBLEMS OR QUESTIONS (#[phone]).     TRY TO TAKE AN EXTRA GABAPENTIN IF YOU ARE HAVING A FLARE OF YOUR NERVE PAIN IN THE RIGHT LEG.

## 2013-07-19 NOTE — Progress Notes (Signed)
Subjective:    Patient ID: Crystal Fitzpatrick, female    DOB: 11-30-1945, 68 y.o.   MRN: [id number]  HPI  Crystal Fitzpatrick is back regarding her chronic pain. She has ongoing problems with her knees. She has severe OA of the right knee which would ordinarily require a TKA.   She has spoken with a vendor potentially about a power wheelchair. The patient is interested in pursuing given that the prosthetics haven't been an option.   She has lost some weight since I last saw her, and she has been able to exercise more, do yoga etc.   Crystal Fitzpatrick hd one fall a month or so ago and again tomorrow when she accidentally stepped out of bed. She is looking at adjusting her bed situation so that she can't fall from her bed.  Over the last 3 weeks she has noticed that her neuropathy has worsened over stump. The pain is shooting and comes and goes. Currently, it's not hurting. It is of a pins and needles quality.     Pain Inventory Average Pain 6 Pain Right Now 6 My pain is sharp, stabbing and tingling  In the last 24 hours, has pain interfered with the following? General activity 7 Relation with others 5 Enjoyment of life 4 What TIME of day is your pain at its worst? morning Sleep (in general) Good  Pain is worse with: some activites Pain improves with: heat/ice Relief from Meds: 6  Mobility Do you have any goals in this area?  yes  Function disabled: date disabled . Do you have any goals in this area?  no  Neuro/Psych No problems in this area  Prior Studies Any changes since last visit?  no  Physicians involved in your care Any changes since last visit?  no   Family History  Problem Relation Age of Onset  . Breast cancer Sister   . Diabetes Other   . Stroke Other     Grandparents   History   Social History  . Marital Status: Divorced    Spouse Name: N/A    Number of Children: 1  . Years of Education: N/A   Occupational History  . retired Marketing executive     Social History Main Topics  . Smoking status: Former Games developer  . Smokeless tobacco: Never Used  . Alcohol Use: No  . Drug Use: No  . Sexual Activity: None   Other Topics Concern  . None   Social History Narrative  . None   Past Surgical History  Procedure Laterality Date  . Amputation  02/02/2008    below right knee  . S/p egd  2007    with Botox for? Achalasia  . Tonsillectomy    . S/p breast biopsy  1992  . 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 Tubal ligation    . Nasal septum surgery    . Foot surgury  1980 and 1981   Past Medical History  Diagnosis Date  . DIABETES MELLITUS, TYPE II 07/17/2008  . B12 DEFICIENCY 07/17/2008  . Acute gouty arthropathy 12/11/2008  . HYPONATREMIA 03/20/2010  . HYPERKALEMIA 10/31/2009  . ANEMIA-NOS 07/17/2008  . ANXIETY 07/17/2008  . Chronic pain syndrome 02/14/2010  . Trigeminal neuralgia 02/14/2010  . HEARING LOSS, RIGHT EAR 06/04/2009  . HYPERTENSION 07/17/2008  . CHF 01/28/2009  . Pneumonia, organism unspecified 02/14/2010  . GERD 07/17/2008  . IBS 06/04/2009  . UNSPECIFIED HEPATITIS 01/28/2009  . RENAL INSUFFICIENCY 10/31/2009  .  MENOPAUSAL DISORDER 12/03/2009  . Cellulitis and abscess of leg, except foot 11/15/2008  . SKIN ULCER, CHRONIC 02/08/2009  . Rheumatoid arthritis(714.0) 07/17/2008  . SKIN LESION 06/04/2009  . ARTHRITIS 01/28/2009  . FOOT PAIN 07/17/2008  . HYPERSOMNIA 02/14/2010  . Altered mental status 02/07/2010  . PERIPHERAL EDEMA 08/30/2008  . Wheezing 12/03/2009  . ABDOMINAL DISTENSION 10/11/2008  . Dysuria 11/11/2009  . Hypoxemia 03/20/2010  . ANAPHYLACTIC SHOCK 05/15/2009  . NEUROPATHY, HX OF 01/28/2009  . COLONIC POLYPS, HX OF 07/17/2008  . AMPUTATION, BELOW KNEE, RIGHT, HX OF 01/28/2009  . Hyperlipemia 09/24/2010  . Hyperlipidemia 09/24/2010  . Seizure 10/26/2010  . Pneumonia, aspiration 12/01/2011    Episode July 2013, tx per [facility]   BP 105/49  Pulse 92  Resp 14  SpO2 94%  Opioid Risk Score:   Fall Risk  Score: High Fall Risk (>13 points) (patient educated handout declined)   Review of Systems  Musculoskeletal: Positive for back pain.  All other systems reviewed and are negative.       Objective:   Physical Exam Constitutional: She appears well-developed and well-nourished. She has lost weight HENT:  Head: Normocephalic and atraumatic.  Nose: Nose normal.  Mouth/Throat: Oropharynx is clear and moist.  Eyes: Conjunctivae are normal.  Neck: Normal range of motion. Neck supple.  Cardiovascular: Normal rate and regular rhythm.  Pulmonary/Chest: No respiratory distress. She has no wheezes.  Musculoskeletal:  Edema 1+ RLE, 2++LLE. Chronic stasis changes are noted on the left leg. Weight is improved. Left knee is slightly swollen, right leg is non-tender to touch, minimal swelling. No bruising is seen.    Neurological: A sensory deficit (left foot), right leg to PP and LT Psychiatric: She has a normal mood and affect. Her behavior is normal. Judgment and thought content normal.   Assessment & Plan:   ASSESSMENT:  1. Right below-knee amputation, history of phantom limb pain.  2. Peripheral neuropathy.  3. History of rheumatoid arthritis.  4. Osteoarthritis of left knee- 5. Chronic cellulitis, left leg with associated edema.  6. Left patella fx (per pt) after fall at home 2 weeks ago    PLAN:  1. Continue compression stocking to right leg for edema control.    2.  I am willing to make a referral to PT for a wheelchair evaluation. I think a powered wheelchair is what she needs. It is certainly the safest option for her. 3. She may take an additional gabapentin to help with increased peripheral neuropathy pain. (the total would be 4x per day during a flare) 4. Refilled her pain medications today including Percocet 10/325, #130  and fentanyl 75 mcg #10.  6.She will follow up with my PA in one month. 30 minutes of face to face patient care time were spent during this visit. All  questions were encouraged and answered. We discussed safety options at home today, including a fall guard for bed.

## 2013-07-25 ENCOUNTER — Ambulatory Visit (INDEPENDENT_AMBULATORY_CARE_PROVIDER_SITE_OTHER): Payer: Medicare Other | Admitting: Internal Medicine

## 2013-07-25 ENCOUNTER — Other Ambulatory Visit (INDEPENDENT_AMBULATORY_CARE_PROVIDER_SITE_OTHER): Payer: Medicare Other

## 2013-07-25 ENCOUNTER — Encounter: Payer: Self-pay | Admitting: Internal Medicine

## 2013-07-25 VITALS — BP 112/80 | HR 100 | Temp 98.3°F

## 2013-07-25 DIAGNOSIS — Z23 Encounter for immunization: Secondary | ICD-10-CM

## 2013-07-25 DIAGNOSIS — J069 Acute upper respiratory infection, unspecified: Secondary | ICD-10-CM

## 2013-07-25 DIAGNOSIS — L03119 Cellulitis of unspecified part of limb: Secondary | ICD-10-CM

## 2013-07-25 DIAGNOSIS — E119 Type 2 diabetes mellitus without complications: Secondary | ICD-10-CM | POA: Diagnosis not present

## 2013-07-25 DIAGNOSIS — I10 Essential (primary) hypertension: Secondary | ICD-10-CM | POA: Diagnosis not present

## 2013-07-25 DIAGNOSIS — L02419 Cutaneous abscess of limb, unspecified: Secondary | ICD-10-CM

## 2013-07-25 DIAGNOSIS — L03116 Cellulitis of left lower limb: Secondary | ICD-10-CM

## 2013-07-25 LAB — BASIC METABOLIC PANEL
BUN: 24 mg/dL — ABNORMAL HIGH (ref 6–23)
CALCIUM: 9.3 mg/dL (ref 8.4–10.5)
CO2: 24 meq/L (ref 19–32)
CREATININE: 1.9 mg/dL — AB (ref 0.4–1.2)
Chloride: 105 mEq/L (ref 96–112)
GFR: 27.82 mL/min — AB (ref >60.00)
Glucose, Bld: 96 mg/dL (ref 70–99)
Potassium: 5.8 mEq/L — ABNORMAL HIGH (ref 3.5–5.1)
SODIUM: 137 meq/L (ref 135–145)

## 2013-07-25 LAB — CBC WITH DIFFERENTIAL/PLATELET
BASOS PCT: 0.3 % (ref 0.0–3.0)
Basophils Absolute: 0 10*3/uL (ref 0.0–0.1)
Eosinophils Absolute: 0.6 10*3/uL (ref 0.0–0.7)
Eosinophils Relative: 4.8 % (ref 0.0–5.0)
HEMATOCRIT: 34 % — AB (ref 36.0–46.0)
HEMOGLOBIN: 11.2 g/dL — AB (ref 12.0–15.0)
LYMPHS PCT: 18.9 % (ref 12.0–46.0)
Lymphs Abs: 2.3 10*3/uL (ref 0.7–4.0)
MCHC: 33 g/dL (ref 30.0–36.0)
MCV: 92.8 fl (ref 78.0–100.0)
MONOS PCT: 6.8 % (ref 3.0–12.0)
Monocytes Absolute: 0.8 10*3/uL (ref 0.1–1.0)
NEUTROS ABS: 8.6 10*3/uL — AB (ref 1.4–7.7)
Neutrophils Relative %: 69.2 % (ref 43.0–77.0)
Platelets: 193 10*3/uL (ref 150.0–400.0)
RBC: 3.66 Mil/uL — AB (ref 3.87–5.11)
RDW: 13.8 % (ref 11.5–14.6)
WBC: 12.4 10*3/uL — ABNORMAL HIGH (ref 4.5–10.5)

## 2013-07-25 LAB — LIPID PANEL
Cholesterol: 224 mg/dL — ABNORMAL HIGH (ref 0–200)
HDL: 32 mg/dL — AB (ref >39.00)
LDL Cholesterol: 128 mg/dL — ABNORMAL HIGH (ref 0–99)
TRIGLYCERIDES: 319 mg/dL — AB (ref 0.0–149.0)
Total CHOL/HDL Ratio: 7
VLDL: 63.8 mg/dL — AB (ref 0.0–40.0)

## 2013-07-25 LAB — HEPATIC FUNCTION PANEL
ALBUMIN: 3.8 g/dL (ref 3.5–5.2)
ALK PHOS: 83 U/L (ref 39–117)
ALT: 11 U/L (ref 0–35)
AST: 13 U/L (ref 0–37)
Bilirubin, Direct: 0 mg/dL (ref 0.0–0.3)
TOTAL PROTEIN: 7.8 g/dL (ref 6.0–8.3)
Total Bilirubin: 0.5 mg/dL (ref 0.3–1.2)

## 2013-07-25 LAB — HEMOGLOBIN A1C: Hgb A1c MFr Bld: 6.4 % (ref 4.6–6.5)

## 2013-07-25 MED ORDER — CEPHALEXIN 500 MG PO CAPS: 500.0000 mg | ORAL_CAPSULE | Freq: Four times a day (QID) | ORAL | Status: DC

## 2013-07-25 NOTE — Progress Notes (Signed)
Subjective:    Patient ID: Crystal Fitzpatrick, female    DOB: 03/12/1946, 68 y.o.   MRN: [id number]  HPI  Here to f/u with 3-4 days onset LLE pain/red/acute on chronic swelling, similar to prior episodes cellulitis, has done well on keflex in past.  No fever chills. Has ongoing severe pain to left knee with endstage DJD, getting what sounds like hyaluronic acid injections, and is nonsurgical candidate. Also with ongoing RLE phantom pains, trying to work with incresaed gabapentin per Dr Vicki Mallet management.  Also today incidentlay  -  Here with 2-3 days acute onset fever, bilat ear pain., but no facial pain, pressure, headache, general weakness and malaise, and no chest pain, wheezing, increased sob or doe, orthopnea, PND, increased LE swelling, palpitations, dizziness or syncope. Past Medical History  Diagnosis Date  . DIABETES MELLITUS, TYPE II 07/17/2008  . B12 DEFICIENCY 07/17/2008  . Acute gouty arthropathy 12/11/2008  . HYPONATREMIA 03/20/2010  . HYPERKALEMIA 10/31/2009  . ANEMIA-NOS 07/17/2008  . ANXIETY 07/17/2008  . Chronic pain syndrome 02/14/2010  . Trigeminal neuralgia 02/14/2010  . HEARING LOSS, RIGHT EAR 06/04/2009  . HYPERTENSION 07/17/2008  . CHF 01/28/2009  . Pneumonia, organism unspecified 02/14/2010  . GERD 07/17/2008  . IBS 06/04/2009  . UNSPECIFIED HEPATITIS 01/28/2009  . RENAL INSUFFICIENCY 10/31/2009  . MENOPAUSAL DISORDER 12/03/2009  . Cellulitis and abscess of leg, except foot 11/15/2008  . SKIN ULCER, CHRONIC 02/08/2009  . Rheumatoid arthritis(714.0) 07/17/2008  . SKIN LESION 06/04/2009  . ARTHRITIS 01/28/2009  . FOOT PAIN 07/17/2008  . HYPERSOMNIA 02/14/2010  . Altered mental status 02/07/2010  . PERIPHERAL EDEMA 08/30/2008  . Wheezing 12/03/2009  . ABDOMINAL DISTENSION 10/11/2008  . Dysuria 11/11/2009  . Hypoxemia 03/20/2010  . ANAPHYLACTIC SHOCK 05/15/2009  . NEUROPATHY, HX OF 01/28/2009  . COLONIC POLYPS, HX OF 07/17/2008  . AMPUTATION, BELOW KNEE, RIGHT, HX OF 01/28/2009  . Hyperlipemia  09/24/2010  . Hyperlipidemia 09/24/2010  . Seizure 10/26/2010  . Pneumonia, aspiration 12/01/2011    Episode July 2013, tx per [place]   Past Surgical History  Procedure Laterality Date  . Amputation  02/02/2008    below right knee  . S/p egd  2007    with Botox for? Achalasia  . Tonsillectomy    . S/p breast biopsy  1992  . 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 Tubal ligation    . Nasal septum surgery    . Foot surgury  1980 and 1981    reports that she has quit smoking. She has never used smokeless tobacco. She reports that she does not drink alcohol or use illicit drugs. family history includes Breast cancer in her sister; Diabetes in her other; Stroke in her other. Allergies  Allergen Reactions  . Morphine Anaphylaxis  . Sulfonamide Derivatives Diarrhea  . Ciprofloxacin Diarrhea  . Doxycycline     Nausea, diarrhea  . Lyrica [Pregabalin]   . Nitrofurantoin     REACTION: itch  . Prednisone     Fast heart rate  . Erythromycin Rash   Current Outpatient Prescriptions on File Prior to Visit  Medication Sig Dispense Refill  . albuterol (PROAIR HFA) 108 (90 BASE) MCG/ACT inhaler Inhale 2 puffs into the lungs every 6 (six) hours as needed.        . Ascorbic Acid (VITAMIN C) 500 MG tablet Take 500 mg by mouth daily.        [name] BENICAR 40 MG tablet  TAKE 1 TABLET BY MOUTH EVERY DAY FOR BLOOD PRESSURE  30 tablet  11  . Blood Glucose Monitoring Suppl (ONE TOUCH ULTRA 2) W/DEVICE KIT 1 Device by Does not apply route once.  1 each  0  . Blood Glucose Monitoring Suppl (ONE TOUCH ULTRA SYSTEM KIT) W/DEVICE KIT 1 kit by Does not apply route once.  1 each  0  . cetirizine (ZYRTEC) 10 MG tablet TAKE 1 TABLET BY MOUTH EVERY DAY  30 tablet  11  . Cholecalciferol (VITAMIN D) 400 UNITS capsule Take 400 Units by mouth daily.        . cyclobenzaprine (FLEXERIL) 10 MG tablet TAKE 1 TABLET BY MOUTH EVERY 8 HOURS AS NEEDED  90 tablet  3  . diclofenac sodium (VOLTAREN) 1 % GEL  Apply 2 g topically 4 (four) times daily.  3 Tube  3  . diphenoxylate-atropine (LOMOTIL) 2.5-0.025 MG per tablet TAKE ONE TABLET TWICE DAILY AS NEEDED  60 tablet  2  . DULoxetine (CYMBALTA) 60 MG capsule TAKE 2 CAPSULES BY MOUTH DAILY  60 capsule  3  . EPINEPHrine (EPIPEN 2-PAK) 0.3 mg/0.3 mL DEVI Inject 0.3 mLs (0.3 mg total) into the muscle once. Use as directed  1 Device  0  . fentaNYL (DURAGESIC - DOSED MCG/HR) 75 MCG/HR Place 1 patch (75 mcg total) onto the skin every 3 (three) days. 1 patch every 3 days  10 patch  0  . furosemide (LASIX) 20 MG tablet TAKE 1 TABLET BY MOUTH EVERY DAY  30 tablet  5  . gabapentin (NEURONTIN) 600 MG tablet Take 1 tablet (600 mg total) by mouth 3 (three) times daily.  90 tablet  3  . lidocaine (LIDODERM) 5 % Place 1 patch onto the skin. Remove & Discard patch within 12 hours or as directed by MD       . loperamide (ANTI-DIARRHEAL) 2 MG tablet Take 2 mg by mouth 2 (two) times daily as needed.        . meclizine (ANTIVERT) 25 MG tablet Take 1 tablet (25 mg total) by mouth 3 (three) times daily as needed.  30 tablet  0  . Multiple Vitamins-Minerals (CENTRUM SILVER ULTRA WOMENS PO) Take by mouth daily.        . Omega-3 Fatty Acids (FISH OIL) 1000 MG CAPS Take by mouth daily.        Glory Rosebush DELICA LANCETS MISC Use as directed to test blood sugar dx 250.00  100 each  2  . oxyCODONE-acetaminophen (PERCOCET) 10-325 MG per tablet Take 1 tablet by mouth every 4 (four) hours as needed for pain.  120 tablet  0  . pantoprazole (PROTONIX) 40 MG tablet TAKE ONE (1) TABLET BY MOUTH EVERY DAY  FOR ACID REFLUX  30 tablet  11  . promethazine (PHENERGAN) 25 MG tablet TAKE 1 TABLET BY MOUTH EVERY 6 HOURS AS NEEDED FOR NAUSEA  60 tablet  1  . promethazine (PHENERGAN) 25 MG tablet TAKE 1 TABLET BY MOUTH EVERY 6 HOURS AS NEEDED FOR NAUSEA  60 tablet  1  . Psyllium (METAMUCIL) 30.9 % POWD Take by mouth. Take as directed       . topiramate (TOPAMAX) 100 MG tablet TAKE 3 TABLETS BY MOUTH  EACH NIGHT AT BEDTIME  90 tablet  3   No current facility-administered medications on file prior to visit.    Review of Systems  Constitutional: Negative for unexpected weight change, or unusual diaphoresis  HENT: Negative for tinnitus.   Eyes:  Negative for photophobia and visual disturbance.  Respiratory: Negative for choking and stridor.   Gastrointestinal: Negative for vomiting and blood in stool.  Genitourinary: Negative for hematuria and decreased urine volume.  Musculoskeletal: Negative for acute joint swelling Skin: Negative for color change and wound.  Neurological: Negative for tremors and numbness other than noted  Psychiatric/Behavioral: Negative for decreased concentration or  hyperactivity.       Objective:   Physical Exam BP 112/80  Pulse 100  Temp(Src) 98.3 F (36.8 C) (Oral)  SpO2 91% VS noted,  Constitutional: Pt appears well-developed and well-nourished.  HENT: Head: NCAT.  Right Ear: External ear normal.  Left Ear: External ear normal.  Eyes: Conjunctivae and EOM are normal. Pupils are equal, round, and reactive to light.  Neck: Normal range of motion. Neck supple.  Bilat tm's with mild erythema.  Max sinus areas non tender.  Pharynx with mild erythema, no exudate Cardiovascular: Normal rate and regular rhythm.   Pulmonary/Chest: Effort normal and breath sounds normal.  - no rales or wheezing Neurological: Pt is alert. Not confused  Skin: Skin is warm. LLE with 6x4 cm area lateral mid leg red/swelling/tender, no abscess or drainage, no red streaks or weeping Psychiatric: Pt behavior is normal. Thought content normal.     Assessment & Plan:

## 2013-07-25 NOTE — Assessment & Plan Note (Signed)
stable overall by history and exam, recent data reviewed with pt, and pt to continue medical treatment as before,  to f/u any worsening symptoms or concerns BP Readings from Last 3 Encounters:  07/25/13 112/80  07/19/13 105/49  06/23/13 122/58

## 2013-07-25 NOTE — Progress Notes (Signed)
Pre visit review using our clinic review tool, if applicable. No additional management support is needed unless otherwise documented below in the visit note. 

## 2013-07-25 NOTE — Assessment & Plan Note (Signed)
?   Viral, ok for mucinex otc prn

## 2013-07-25 NOTE — Addendum Note (Signed)
Addended by: Scharlene Gloss B on: 07/25/2013 03:54 PM   Modules accepted: Orders

## 2013-07-25 NOTE — Assessment & Plan Note (Signed)
Mild to mod, for antibx course,  to f/u any worsening symptoms or concerns 

## 2013-07-25 NOTE — Patient Instructions (Addendum)
You had the new Prevnar pneuomonia shot  Please take all new medication as prescribed Please continue all other medications as before, and refills have been done if requested. Please have the pharmacy call with any other refills you may need.  Please continue your efforts at being more active, low cholesterol diet, and weight control.  Please go to the LAB in the Basement (turn left off the elevator) for the tests to be done today You will be contacted by phone if any changes need to be made immediately.  Otherwise, you will receive a letter about your results with an explanation, but please check with MyChart first.  Please return in 6 months, or sooner if needed

## 2013-07-25 NOTE — Assessment & Plan Note (Signed)
stable overall by history and exam, recent data reviewed with pt, and pt to continue medical treatment as before,  to f/u any worsening symptoms or concerns Lab Results  Component Value Date   HGBA1C 6.5 12/12/2012   For f/u labs

## 2013-07-26 ENCOUNTER — Other Ambulatory Visit: Payer: Self-pay | Admitting: Internal Medicine

## 2013-07-26 ENCOUNTER — Telehealth: Payer: Self-pay

## 2013-07-26 MED ORDER — ATORVASTATIN CALCIUM 10 MG PO TABS: 10.0000 mg | ORAL_TABLET | Freq: Every day | ORAL | Status: DC

## 2013-07-26 NOTE — Telephone Encounter (Signed)
Patient called to let us know that she would like to go to [place] for her therapy.  She would also like an appointment after 10am, due to transportation.

## 2013-07-27 DIAGNOSIS — H26499 Other secondary cataract, unspecified eye: Secondary | ICD-10-CM | POA: Diagnosis not present

## 2013-08-14 ENCOUNTER — Other Ambulatory Visit: Payer: Self-pay | Admitting: *Deleted

## 2013-08-14 DIAGNOSIS — S88119A Complete traumatic amputation at level between knee and ankle, unspecified lower leg, initial encounter: Secondary | ICD-10-CM

## 2013-08-14 DIAGNOSIS — G546 Phantom limb syndrome with pain: Secondary | ICD-10-CM

## 2013-08-14 DIAGNOSIS — M47816 Spondylosis without myelopathy or radiculopathy, lumbar region: Secondary | ICD-10-CM

## 2013-08-14 DIAGNOSIS — M069 Rheumatoid arthritis, unspecified: Secondary | ICD-10-CM

## 2013-08-14 DIAGNOSIS — G609 Hereditary and idiopathic neuropathy, unspecified: Secondary | ICD-10-CM

## 2013-08-14 DIAGNOSIS — M1712 Unilateral primary osteoarthritis, left knee: Secondary | ICD-10-CM

## 2013-08-14 MED ORDER — FENTANYL 75 MCG/HR TD PT72
75.0000 ug | MEDICATED_PATCH | TRANSDERMAL | Status: DC
Start: 2013-08-14 — End: 2013-09-13

## 2013-08-14 MED ORDER — OXYCODONE-ACETAMINOPHEN 10-325 MG PO TABS: 1.0000 | ORAL_TABLET | ORAL | Status: DC | PRN

## 2013-08-15 ENCOUNTER — Other Ambulatory Visit: Payer: Self-pay | Admitting: Internal Medicine

## 2013-08-15 NOTE — Telephone Encounter (Signed)
Done hardcopy to robin  

## 2013-08-15 NOTE — Telephone Encounter (Signed)
Faxed hardcopy of Lomotil to [facility].

## 2013-09-07 ENCOUNTER — Other Ambulatory Visit: Payer: Self-pay | Admitting: Internal Medicine

## 2013-09-08 ENCOUNTER — Other Ambulatory Visit: Payer: Self-pay | Admitting: Internal Medicine

## 2013-09-11 ENCOUNTER — Other Ambulatory Visit: Payer: Self-pay

## 2013-09-11 MED ORDER — CYCLOBENZAPRINE HCL 10 MG PO TABS: ORAL_TABLET | ORAL | Status: DC

## 2013-09-13 ENCOUNTER — Encounter: Payer: Medicare Other | Admitting: Physical Medicine & Rehabilitation

## 2013-09-13 ENCOUNTER — Encounter: Payer: Medicare Other | Attending: Physical Medicine and Rehabilitation | Admitting: Physical Medicine & Rehabilitation

## 2013-09-13 ENCOUNTER — Encounter: Payer: Self-pay | Admitting: Physical Medicine & Rehabilitation

## 2013-09-13 VITALS — BP 150/60 | HR 90 | Resp 16 | Ht 69.0 in | Wt 279.0 lb

## 2013-09-13 DIAGNOSIS — IMO0002 Reserved for concepts with insufficient information to code with codable children: Secondary | ICD-10-CM

## 2013-09-13 DIAGNOSIS — M1710 Unilateral primary osteoarthritis, unspecified knee: Secondary | ICD-10-CM | POA: Diagnosis not present

## 2013-09-13 DIAGNOSIS — M069 Rheumatoid arthritis, unspecified: Secondary | ICD-10-CM | POA: Diagnosis not present

## 2013-09-13 DIAGNOSIS — M47817 Spondylosis without myelopathy or radiculopathy, lumbosacral region: Secondary | ICD-10-CM

## 2013-09-13 DIAGNOSIS — G609 Hereditary and idiopathic neuropathy, unspecified: Secondary | ICD-10-CM

## 2013-09-13 DIAGNOSIS — L02419 Cutaneous abscess of limb, unspecified: Secondary | ICD-10-CM

## 2013-09-13 DIAGNOSIS — L03119 Cellulitis of unspecified part of limb: Secondary | ICD-10-CM

## 2013-09-13 DIAGNOSIS — S88119A Complete traumatic amputation at level between knee and ankle, unspecified lower leg, initial encounter: Secondary | ICD-10-CM

## 2013-09-13 DIAGNOSIS — G547 Phantom limb syndrome without pain: Secondary | ICD-10-CM | POA: Diagnosis not present

## 2013-09-13 DIAGNOSIS — G546 Phantom limb syndrome with pain: Secondary | ICD-10-CM

## 2013-09-13 DIAGNOSIS — M1712 Unilateral primary osteoarthritis, left knee: Secondary | ICD-10-CM

## 2013-09-13 DIAGNOSIS — L03116 Cellulitis of left lower limb: Secondary | ICD-10-CM

## 2013-09-13 DIAGNOSIS — M47816 Spondylosis without myelopathy or radiculopathy, lumbar region: Secondary | ICD-10-CM

## 2013-09-13 MED ORDER — OXYCODONE-ACETAMINOPHEN 10-325 MG PO TABS: 1.0000 | ORAL_TABLET | ORAL | Status: DC | PRN

## 2013-09-13 MED ORDER — FENTANYL 75 MCG/HR TD PT72: 75.0000 ug | MEDICATED_PATCH | TRANSDERMAL | Status: DC

## 2013-09-13 NOTE — Patient Instructions (Signed)
TRY TAKING AN ADDITIONAL GABAPENTIN ON OCCASION WHEN YOU HAVE A FLARE OF YOUR PAIN

## 2013-09-13 NOTE — Progress Notes (Signed)
Subjective:    Patient ID: Crystal Fitzpatrick, female    DOB: 05-Sep-1945, 68 y.o.   MRN: [id number]  HPI  Crystal Fitzpatrick is back regarding her chronic pain. She has had problems with edema again and subsequent cellulitis in the left leg--edema was worse after she increased the gabapentin to qid. She didn't take episodically for flares as we had discussed at last visit. She has gotten nowhere with her powered wheelchair evaluation. She is trying some compressive wraps for her left leg when she's able to get them on. She hasn't been wearing anything with the recent infection.     Pain Inventory Average Pain n/a Pain Right Now n/a My pain is n/a  In the last 24 hours, has pain interfered with the following? General activity 2 Relation with others 1 Enjoyment of life 0 What TIME of day is your pain at its worst? morning and night Sleep (in general) Poor  Pain is worse with: inactivity Pain improves with: medication and elevations Relief from Meds: 7  Mobility Do you have any goals in this area?  yes  Function I need assistance with the following:  bathing Do you have any goals in this area?  yes  Neuro/Psych spasms  Prior Studies Any changes since last visit?  no  Physicians involved in your care Any changes since last visit?  no   Family History  Problem Relation Age of Onset  . Breast cancer Sister   . Diabetes Other   . Stroke Other     Grandparents   History   Social History  . Marital Status: Divorced    Spouse Name: N/A    Number of Children: 1  . Years of Education: N/A   Occupational History  . retired Marketing executive    Social History Main Topics  . Smoking status: Former Games developer  . Smokeless tobacco: Never Used  . Alcohol Use: No  . Drug Use: No  . Sexual Activity: None   Other Topics Concern  . None   Social History Narrative  . None   Past Surgical History  Procedure Laterality Date  . Amputation  02/02/2008    below right knee    . S/p egd  2007    with Botox for? Achalasia  . Tonsillectomy    . S/p breast biopsy  1992  . 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 Tubal ligation    . Nasal septum surgery    . Foot surgury  1980 and 1981   Past Medical History  Diagnosis Date  . DIABETES MELLITUS, TYPE II 07/17/2008  . B12 DEFICIENCY 07/17/2008  . Acute gouty arthropathy 12/11/2008  . HYPONATREMIA 03/20/2010  . HYPERKALEMIA 10/31/2009  . ANEMIA-NOS 07/17/2008  . ANXIETY 07/17/2008  . Chronic pain syndrome 02/14/2010  . Trigeminal neuralgia 02/14/2010  . HEARING LOSS, RIGHT EAR 06/04/2009  . HYPERTENSION 07/17/2008  . CHF 01/28/2009  . Pneumonia, organism unspecified 02/14/2010  . GERD 07/17/2008  . IBS 06/04/2009  . UNSPECIFIED HEPATITIS 01/28/2009  . RENAL INSUFFICIENCY 10/31/2009  . MENOPAUSAL DISORDER 12/03/2009  . Cellulitis and abscess of leg, except foot 11/15/2008  . SKIN ULCER, CHRONIC 02/08/2009  . Rheumatoid arthritis(714.0) 07/17/2008  . SKIN LESION 06/04/2009  . ARTHRITIS 01/28/2009  . FOOT PAIN 07/17/2008  . HYPERSOMNIA 02/14/2010  . Altered mental status 02/07/2010  . PERIPHERAL EDEMA 08/30/2008  . Wheezing 12/03/2009  . ABDOMINAL DISTENSION 10/11/2008  . Dysuria 11/11/2009  . Hypoxemia 03/20/2010  .  ANAPHYLACTIC SHOCK 05/15/2009  . NEUROPATHY, HX OF 01/28/2009  . COLONIC POLYPS, HX OF 07/17/2008  . AMPUTATION, BELOW KNEE, RIGHT, HX OF 01/28/2009  . Hyperlipemia 09/24/2010  . Hyperlipidemia 09/24/2010  . Seizure 10/26/2010  . Pneumonia, aspiration 12/01/2011    Episode July 2013, tx per [facility]   BP 150/60  Pulse 90  Resp 16  Ht 5\' 9"  (1.753 m)  Wt 279 lb (126.554 kg)  BMI 41.18 kg/m2  SpO2 95%  Opioid Risk Score:   Fall Risk Score: High Fall Risk (>13 points) (patient educated handout declined)   Review of Systems  Cardiovascular: Positive for leg swelling.  Musculoskeletal: Positive for gait problem.  Neurological:       Spasms  All other systems reviewed and are negative.       Objective:   Physical Exam  Constitutional: She appears well-developed and well-nourished. She has lost weight  HENT:  Head: Normocephalic and atraumatic.  Nose: Nose normal.  Mouth/Throat: Oropharynx is clear and moist.  Eyes: Conjunctivae are normal.  Neck: Normal range of motion. Neck supple.  Cardiovascular: Normal rate and regular rhythm.  Pulmonary/Chest: No respiratory distress. She has no wheezes.  Musculoskeletal:  Edema 1+ RLE, 2++LLE. Chronic stasis changes are noted on the left leg. Weight is improved. Left knee is red/indurated-no bruising or blisters.  right leg is non-tender to touch, minimal swelling. No bruising is seen.   Neurological: A sensory deficit (left foot), right leg to PP and LT Psychiatric: She has a normal mood and affect. Her behavior is normal. Judgment and thought content normal.  Assessment & Plan:   ASSESSMENT:  1. Right below-knee amputation, history of phantom limb pain.  2. Peripheral neuropathy.  3. History of rheumatoid arthritis.  4. Osteoarthritis of left knee-  5. Chronic cellulitis, left leg with associated edema.  6. Left patella fx (per pt) after fall at home 2 weeks ago    PLAN:  1. Discussed alternatives for compression including ACE wrapping which may be a better option with her cellulitis and propensity for wounds on the LLE.  2. Needs PT w/c evaluation.  Will request at [facility] to see if we can make some headway. 3. Maintain gabapentin at TID with QID for flare, watching closely for edema.  4. Refilled her pain medications today including Percocet 10/325, #130  and fentanyl 75 mcg #10.  6.She will follow up with my NP in one month. 30 minutes of face to face patient care time were spent during this visit. All questions were encouraged and answered. We discussed safety options at home today, including a fall guard for bed.

## 2013-09-14 ENCOUNTER — Ambulatory Visit: Payer: Medicare Other | Admitting: Internal Medicine

## 2013-09-15 ENCOUNTER — Other Ambulatory Visit: Payer: Self-pay | Admitting: *Deleted

## 2013-09-15 MED ORDER — EPINEPHRINE 0.3 MG/0.3ML IJ SOAJ: 0.3000 mg | Freq: Once | INTRAMUSCULAR | Status: DC

## 2013-09-22 ENCOUNTER — Ambulatory Visit (INDEPENDENT_AMBULATORY_CARE_PROVIDER_SITE_OTHER): Payer: Medicare Other | Admitting: Internal Medicine

## 2013-09-22 ENCOUNTER — Encounter: Payer: Self-pay | Admitting: Internal Medicine

## 2013-09-22 VITALS — BP 132/80 | HR 83 | Temp 99.2°F

## 2013-09-22 DIAGNOSIS — E785 Hyperlipidemia, unspecified: Secondary | ICD-10-CM | POA: Diagnosis not present

## 2013-09-22 DIAGNOSIS — L03119 Cellulitis of unspecified part of limb: Secondary | ICD-10-CM

## 2013-09-22 DIAGNOSIS — I10 Essential (primary) hypertension: Secondary | ICD-10-CM

## 2013-09-22 DIAGNOSIS — E119 Type 2 diabetes mellitus without complications: Secondary | ICD-10-CM | POA: Diagnosis not present

## 2013-09-22 DIAGNOSIS — L02419 Cutaneous abscess of limb, unspecified: Secondary | ICD-10-CM | POA: Diagnosis not present

## 2013-09-22 DIAGNOSIS — L03116 Cellulitis of left lower limb: Secondary | ICD-10-CM

## 2013-09-22 DIAGNOSIS — R197 Diarrhea, unspecified: Secondary | ICD-10-CM

## 2013-09-22 MED ORDER — DICYCLOMINE HCL 10 MG PO CAPS: 10.0000 mg | ORAL_CAPSULE | Freq: Three times a day (TID) | ORAL | Status: DC

## 2013-09-22 MED ORDER — CLINDAMYCIN HCL 300 MG PO CAPS: 300.0000 mg | ORAL_CAPSULE | Freq: Three times a day (TID) | ORAL | Status: DC

## 2013-09-22 NOTE — Patient Instructions (Addendum)
Please take all new medication as prescribed - the antibiotic (sent to the pharmacy), and the bentyl as needed for abd cramping pain  OK to re-start the metamucil as before, and consider immodium as needed  OK to stay off the statin medication for now, and follow lower cholesterol diet  Please continue all other medications as before, and refills have been done if requested. Please have the pharmacy call with any other refills you may need.  Please continue your efforts at being more active, low cholesterol diet, and weight control.  No further lab work needed today

## 2013-09-22 NOTE — Progress Notes (Signed)
Subjective:    Patient ID: Crystal Fitzpatrick, female    DOB: April 15, 1946, 68 y.o.   MRN: [id number]  HPI Here with 3 days onset LLE anterior distal leg with 3 days gradual red, swelling, tender without drainage or ulcerations;  Has hx of recurrent cellulitis same leg,  Several med allergies as well, and hx of hospn for same.  No high fevers, chills, Pt denies chest pain, increased sob or doe, wheezing, orthopnea, PND, increased LE swelling, palpitations, dizziness or syncope.  Pt denies new neurological symptoms such as new headache, or facial or extremity weakness or numbness  Does have recurrent loose stools chronic but has not been taking her metamucil that usually works, asks for refill bentyl as well.   Past Medical History  Diagnosis Date  . DIABETES MELLITUS, TYPE II 07/17/2008  . B12 DEFICIENCY 07/17/2008  . Acute gouty arthropathy 12/11/2008  . HYPONATREMIA 03/20/2010  . HYPERKALEMIA 10/31/2009  . ANEMIA-NOS 07/17/2008  . ANXIETY 07/17/2008  . Chronic pain syndrome 02/14/2010  . Trigeminal neuralgia 02/14/2010  . HEARING LOSS, RIGHT EAR 06/04/2009  . HYPERTENSION 07/17/2008  . CHF 01/28/2009  . Pneumonia, organism unspecified 02/14/2010  . GERD 07/17/2008  . IBS 06/04/2009  . UNSPECIFIED HEPATITIS 01/28/2009  . RENAL INSUFFICIENCY 10/31/2009  . MENOPAUSAL DISORDER 12/03/2009  . Cellulitis and abscess of leg, except foot 11/15/2008  . SKIN ULCER, CHRONIC 02/08/2009  . Rheumatoid arthritis(714.0) 07/17/2008  . SKIN LESION 06/04/2009  . ARTHRITIS 01/28/2009  . FOOT PAIN 07/17/2008  . HYPERSOMNIA 02/14/2010  . Altered mental status 02/07/2010  . PERIPHERAL EDEMA 08/30/2008  . Wheezing 12/03/2009  . ABDOMINAL DISTENSION 10/11/2008  . Dysuria 11/11/2009  . Hypoxemia 03/20/2010  . ANAPHYLACTIC SHOCK 05/15/2009  . NEUROPATHY, HX OF 01/28/2009  . COLONIC POLYPS, HX OF 07/17/2008  . AMPUTATION, BELOW KNEE, RIGHT, HX OF 01/28/2009  . Hyperlipemia 09/24/2010  . Hyperlipidemia 09/24/2010  . Seizure 10/26/2010  . Pneumonia,  aspiration 12/01/2011    Episode July 2013, tx per [place]   Past Surgical History  Procedure Laterality Date  . Amputation  02/02/2008    below right knee  . S/p egd  2007    with Botox for? Achalasia  . Tonsillectomy    . S/p breast biopsy  1992  . 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 Tubal ligation    . Nasal septum surgery    . Foot surgury  1980 and 1981    reports that she has quit smoking. She has never used smokeless tobacco. She reports that she does not drink alcohol or use illicit drugs. family history includes Breast cancer in her sister; Diabetes in her other; Stroke in her other. Allergies  Allergen Reactions  . Morphine Anaphylaxis  . Sulfonamide Derivatives Diarrhea  . Ciprofloxacin Diarrhea  . Doxycycline     Nausea, diarrhea  . Lyrica [Pregabalin]   . Nitrofurantoin     REACTION: itch  . Prednisone     Fast heart rate  . Erythromycin Rash   Current Outpatient Prescriptions on File Prior to Visit  Medication Sig Dispense Refill  . albuterol (PROAIR HFA) 108 (90 BASE) MCG/ACT inhaler Inhale 2 puffs into the lungs every 6 (six) hours as needed.        . Ascorbic Acid (VITAMIN C) 500 MG tablet Take 500 mg by mouth daily.        [name] atorvastatin (LIPITOR) 10 MG tablet Take 1 tablet (10 mg  total) by mouth daily.  90 tablet  3  . BENICAR 40 MG tablet TAKE 1 TABLET BY MOUTH EVERY DAY FOR BLOOD PRESSURE  30 tablet  11  . Blood Glucose Monitoring Suppl (ONE TOUCH ULTRA 2) W/DEVICE KIT 1 Device by Does not apply route once.  1 each  0  . Blood Glucose Monitoring Suppl (ONE TOUCH ULTRA SYSTEM KIT) W/DEVICE KIT 1 kit by Does not apply route once.  1 each  0  . cetirizine (ZYRTEC) 10 MG tablet TAKE 1 TABLET BY MOUTH EVERY DAY  30 tablet  11  . Cholecalciferol (VITAMIN D) 400 UNITS capsule Take 400 Units by mouth daily.        . cyclobenzaprine (FLEXERIL) 10 MG tablet TAKE 1 TABLET BY MOUTH EVERY 8 HOURS AS NEEDED 1 month supply  90 tablet  3  .  diclofenac sodium (VOLTAREN) 1 % GEL Apply 2 g topically 4 (four) times daily.  3 Tube  3  . diphenoxylate-atropine (LOMOTIL) 2.5-0.025 MG per tablet TAKE 1 TABLET BY MOUTH 2 TIMES A DAY AS NEEDED  60 tablet  0  . DULoxetine (CYMBALTA) 60 MG capsule TAKE 2 CAPSULES BY MOUTH DAILY  60 capsule  3  . EPINEPHrine (EPIPEN 2-PAK) 0.3 mg/0.3 mL DEVI Inject 0.3 mLs (0.3 mg total) into the muscle once. Use as directed  1 Device  0  . EPINEPHrine (EPIPEN) 0.3 mg/0.3 mL IJ SOAJ injection Inject 0.3 mLs (0.3 mg total) into the muscle once.  1 Device  2  . fentaNYL (DURAGESIC - DOSED MCG/HR) 75 MCG/HR Place 1 patch (75 mcg total) onto the skin every 3 (three) days. 1 patch every 3 days  10 patch  0  . furosemide (LASIX) 20 MG tablet TAKE 1 TABLET BY MOUTH EVERY DAY  30 tablet  5  . gabapentin (NEURONTIN) 600 MG tablet Take 1 tablet (600 mg total) by mouth 3 (three) times daily.  90 tablet  3  . lidocaine (LIDODERM) 5 % Place 1 patch onto the skin. Remove & Discard patch within 12 hours or as directed by MD       . loperamide (ANTI-DIARRHEAL) 2 MG tablet Take 2 mg by mouth 2 (two) times daily as needed.        . meclizine (ANTIVERT) 25 MG tablet Take 1 tablet (25 mg total) by mouth 3 (three) times daily as needed.  30 tablet  0  . Multiple Vitamins-Minerals (CENTRUM SILVER ULTRA WOMENS PO) Take by mouth daily.        . Omega-3 Fatty Acids (FISH OIL) 1000 MG CAPS Take by mouth daily.        Glory Rosebush DELICA LANCETS MISC Use as directed to test blood sugar dx 250.00  100 each  2  . oxyCODONE-acetaminophen (PERCOCET) 10-325 MG per tablet Take 1 tablet by mouth every 4 (four) hours as needed for pain.  120 tablet  0  . pantoprazole (PROTONIX) 40 MG tablet TAKE ONE (1) TABLET BY MOUTH EVERY DAY  FOR ACID REFLUX  30 tablet  11  . promethazine (PHENERGAN) 25 MG tablet TAKE 1 TABLET BY MOUTH EVERY 6 HOURS AS NEEDED FOR NAUSEA  60 tablet  1  . promethazine (PHENERGAN) 25 MG tablet TAKE 1 TABLET BY MOUTH EVERY 6 HOURS AS  NEEDED FOR NAUSEA  60 tablet  1  . Psyllium (METAMUCIL) 30.9 % POWD Take by mouth. Take as directed       . topiramate (TOPAMAX) 100 MG tablet TAKE 3  TABLETS BY MOUTH EACH NIGHT AT BEDTIME  90 tablet  3   No current facility-administered medications on file prior to visit.   Review of Systems  Constitutional: Negative for unusual diaphoresis or other sweats  HENT: Negative for ringing in ear Eyes: Negative for double vision or worsening visual disturbance.  Respiratory: Negative for choking and stridor.   Gastrointestinal: Negative for vomiting or other signifcant bowel change Genitourinary: Negative for hematuria or decreased urine volume.  Musculoskeletal: Negative for other MSK pain or swelling Skin: Negative for color change and worsening wound.  Neurological: Negative for tremors and numbness other than noted  Psychiatric/Behavioral: Negative for decreased concentration or agitation other than above       Objective:   Physical Exam BP 132/80  Pulse 83  Temp(Src) 99.2 F (37.3 C) (Oral)  SpO2 94% VS noted,  Constitutional: Pt appears well-developed, well-nourished.  HENT: Head: NCAT.  Right Ear: External ear normal.  Left Ear: External ear normal.  Eyes: . Pupils are equal, round, and reactive to light. Conjunctivae and EOM are normal Neck: Normal range of motion. Neck supple.  Cardiovascular: Normal rate and regular rhythm.   Pulmonary/Chest: Effort normal and breath sounds normal.  Abd:  Soft, NT, ND, + BS Neurological: Pt is alert. Not confused , motor grossly intact Skin: LLE with mild to mod 4 x 6 cm oval area erythema/tender/induration anterior distal just above ankle; no weepiness/fluctuance, drainage, ulcers.  No red streaks   Psychiatric: Pt behavior is normal. No agitation.     Assessment & Plan:

## 2013-09-22 NOTE — Progress Notes (Signed)
Pre visit review using our clinic review tool, if applicable. No additional management support is needed unless otherwise documented below in the visit note. 

## 2013-09-23 DIAGNOSIS — R197 Diarrhea, unspecified: Secondary | ICD-10-CM | POA: Insufficient documentation

## 2013-09-23 NOTE — Assessment & Plan Note (Signed)
stable overall by history and exam, recent data reviewed with pt, and pt to continue medical treatment as before,  to f/u any worsening symptoms or concerns BP Readings from Last 3 Encounters:  09/22/13 132/80  09/13/13 150/60  07/25/13 112/80

## 2013-09-23 NOTE — Assessment & Plan Note (Signed)
For metamucil re-start, bentyl prn,  to f/u any worsening symptoms or concerns

## 2013-09-23 NOTE — Assessment & Plan Note (Signed)
Mild to mod, for antibx course,  to f/u any worsening symptoms or concerns 

## 2013-09-23 NOTE — Assessment & Plan Note (Signed)
stable overall by history and exam, recent data reviewed with pt, and pt to continue medical treatment as before,  to f/u any worsening symptoms or concerns Lab Results  Component Value Date   HGBA1C 6.4 07/25/2013    

## 2013-09-23 NOTE — Assessment & Plan Note (Signed)
D/w pt,  Lab Results  Component Value Date   LDLCALC 128* 07/25/2013   Declines statin

## 2013-10-12 ENCOUNTER — Encounter: Payer: Medicare Other | Admitting: Registered Nurse

## 2013-10-17 ENCOUNTER — Encounter: Payer: Medicare Other | Admitting: Registered Nurse

## 2013-10-18 ENCOUNTER — Encounter: Payer: Self-pay | Admitting: Registered Nurse

## 2013-10-18 ENCOUNTER — Encounter: Payer: Medicare Other | Attending: Physical Medicine and Rehabilitation | Admitting: Registered Nurse

## 2013-10-18 VITALS — BP 145/75 | HR 83 | Resp 14 | Ht 70.0 in | Wt 279.0 lb

## 2013-10-18 DIAGNOSIS — M47817 Spondylosis without myelopathy or radiculopathy, lumbosacral region: Secondary | ICD-10-CM

## 2013-10-18 DIAGNOSIS — M1710 Unilateral primary osteoarthritis, unspecified knee: Secondary | ICD-10-CM | POA: Diagnosis not present

## 2013-10-18 DIAGNOSIS — M1712 Unilateral primary osteoarthritis, left knee: Secondary | ICD-10-CM

## 2013-10-18 DIAGNOSIS — G609 Hereditary and idiopathic neuropathy, unspecified: Secondary | ICD-10-CM | POA: Diagnosis not present

## 2013-10-18 DIAGNOSIS — S88119A Complete traumatic amputation at level between knee and ankle, unspecified lower leg, initial encounter: Secondary | ICD-10-CM

## 2013-10-18 DIAGNOSIS — Z79899 Other long term (current) drug therapy: Secondary | ICD-10-CM

## 2013-10-18 DIAGNOSIS — Z5181 Encounter for therapeutic drug level monitoring: Secondary | ICD-10-CM

## 2013-10-18 DIAGNOSIS — IMO0002 Reserved for concepts with insufficient information to code with codable children: Secondary | ICD-10-CM | POA: Diagnosis not present

## 2013-10-18 DIAGNOSIS — M47816 Spondylosis without myelopathy or radiculopathy, lumbar region: Secondary | ICD-10-CM

## 2013-10-18 DIAGNOSIS — M069 Rheumatoid arthritis, unspecified: Secondary | ICD-10-CM | POA: Diagnosis not present

## 2013-10-18 MED ORDER — FENTANYL 75 MCG/HR TD PT72: 75.0000 ug | MEDICATED_PATCH | TRANSDERMAL | Status: DC

## 2013-10-18 MED ORDER — OXYCODONE-ACETAMINOPHEN 10-325 MG PO TABS: 1.0000 | ORAL_TABLET | Freq: Four times a day (QID) | ORAL | Status: DC | PRN

## 2013-10-18 MED ORDER — LIDOCAINE 5 % EX PTCH: 1.0000 | MEDICATED_PATCH | Freq: Every day | CUTANEOUS | Status: DC

## 2013-10-18 MED ORDER — DICLOFENAC SODIUM 1 % TD GEL: 2.0000 g | Freq: Four times a day (QID) | TRANSDERMAL | Status: DC

## 2013-10-18 NOTE — Progress Notes (Signed)
Subjective:    Patient ID: Crystal Fitzpatrick, female    DOB: 05-23-1945, 68 y.o.   MRN: [id number]  HPI: Crystal Fitzpatrick is a 68 year old female who returns for follow up for chronic pain and medication refill. She says her pain is located in her lower back, bilateral arms and left leg. She rates her pain 5. Her current exercise regime Yoga daily and performing stretching exercises.  She arrived to clinic in wheelchair. She has not had her wheelchair evaluation. Neuro Rehabilitation called and she was scheduled for evaluation in July 2015.  Pain Inventory Average Pain 6 Pain Right Now 5 My pain is intermittent, sharp, burning and aching  In the last 24 hours, has pain interfered with the following? General activity 7 Relation with others 5 Enjoyment of life 5 What TIME of day is your pain at its worst? night Sleep (in general) Poor  Pain is worse with: unsure Pain improves with: medication and elevation Relief from Meds: 6  Mobility ability to climb steps?  no do you drive?  no use a wheelchair  Function disabled: date disabled na retired I need assistance with the following:  shopping  Neuro/Psych tremor  Prior Studies Any changes since last visit?  no  Physicians involved in your care Any changes since last visit?  no   Family History  Problem Relation Age of Onset  . Breast cancer Sister   . Diabetes Other   . Stroke Other     Grandparents   History   Social History  . Marital Status: Divorced    Spouse Name: N/A    Number of Children: 1  . Years of Education: N/A   Occupational History  . retired Marketing executive    Social History Main Topics  . Smoking status: Former Games developer  . Smokeless tobacco: Never Used  . Alcohol Use: No  . Drug Use: No  . Sexual Activity: None   Other Topics Concern  . None   Social History Narrative  . None   Past Surgical History  Procedure Laterality Date  . Amputation  02/02/2008    below right knee   . S/p egd  2007    with Botox for? Achalasia  . Tonsillectomy    . S/p breast biopsy  1992  . 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 Tubal ligation    . Nasal septum surgery    . Foot surgury  1980 and 1981   Past Medical History  Diagnosis Date  . DIABETES MELLITUS, TYPE II 07/17/2008  . B12 DEFICIENCY 07/17/2008  . Acute gouty arthropathy 12/11/2008  . HYPONATREMIA 03/20/2010  . HYPERKALEMIA 10/31/2009  . ANEMIA-NOS 07/17/2008  . ANXIETY 07/17/2008  . Chronic pain syndrome 02/14/2010  . Trigeminal neuralgia 02/14/2010  . HEARING LOSS, RIGHT EAR 06/04/2009  . HYPERTENSION 07/17/2008  . CHF 01/28/2009  . Pneumonia, organism unspecified 02/14/2010  . GERD 07/17/2008  . IBS 06/04/2009  . UNSPECIFIED HEPATITIS 01/28/2009  . RENAL INSUFFICIENCY 10/31/2009  . MENOPAUSAL DISORDER 12/03/2009  . Cellulitis and abscess of leg, except foot 11/15/2008  . SKIN ULCER, CHRONIC 02/08/2009  . Rheumatoid arthritis(714.0) 07/17/2008  . SKIN LESION 06/04/2009  . ARTHRITIS 01/28/2009  . FOOT PAIN 07/17/2008  . HYPERSOMNIA 02/14/2010  . Altered mental status 02/07/2010  . PERIPHERAL EDEMA 08/30/2008  . Wheezing 12/03/2009  . ABDOMINAL DISTENSION 10/11/2008  . Dysuria 11/11/2009  . Hypoxemia 03/20/2010  . ANAPHYLACTIC SHOCK 05/15/2009  .  NEUROPATHY, HX OF 01/28/2009  . COLONIC POLYPS, HX OF 07/17/2008  . AMPUTATION, BELOW KNEE, RIGHT, HX OF 01/28/2009  . Hyperlipemia 09/24/2010  . Hyperlipidemia 09/24/2010  . Seizure 10/26/2010  . Pneumonia, aspiration 12/01/2011    Episode July 2013, tx per [facility]   BP 145/75  Pulse 83  Resp 14  Ht 5\' 10"  (1.778 m)  Wt 279 lb (126.554 kg)  BMI 40.03 kg/m2  SpO2 95%  Opioid Risk Score:   Fall Risk Score: Moderate Fall Risk (6-13 points) (pt educated onfall riks, brochure given to pt previously)    Review of Systems  Neurological: Positive for tremors.  All other systems reviewed and are negative.      Objective:   Physical Exam  Nursing note and  vitals reviewed. Constitutional: She is oriented to person, place, and time. She appears well-developed and well-nourished.  HENT:  Head: Normocephalic and atraumatic.  Neck: Normal range of motion. Neck supple.  Cardiovascular: Normal rate, regular rhythm and normal heart sounds.   Pulmonary/Chest: Effort normal and breath sounds normal.  Musculoskeletal:  Normal Muscle Bulk and Muscle testing reveals: Upper Extremities Full ROM and Muscle strength 5/5. Lumbar Paraspinal Tenderness Noted. L-3- L-5 Lower Extremities: Right BKA/ Sleeve intact Left Lower Extremities/ Edematous/ Full ROM and Muscle Strength 5/5 In Wheelchair/ She uses wheelchair at home also Awaiting on Motorized W/C evaluation  Neurological: She is alert and oriented to person, place, and time.  Skin: Skin is warm and dry.  Left Lower Leg Edematous/ Anasarca and warm.  Psychiatric: She has a normal mood and affect.          Assessment & Plan:  1. Right below-knee amputation, history of phantom limb pain.: Sleeve intact. No complaints at this time regarding phantom pain.Continue Current Medication Regime. Continue to Monitor. 2. Peripheral neuropathy: Continue  Gabapentin. Refilled: Fenatnyl Patch 75 mcg one patch every three days #10 and Oxycodone 10/325mg  one tablet every 6 hours as needed #120 3. History of rheumatoid arthritis. Continue Current Medication and Exercise and heat Therapy. 4. Osteoarthritis of left knee: Continue with Voltaren gel/ Exercise and heat therapy. 5. Chronic cellulitis, left leg with associated edema.: F/U PMD was on two round of ABT for cellulitis. She will call PMD today. LKooks like Cellulitis/ Consider Infection Control Referral   30 minutes of face to face patient care time was spent during this visit. All questions were encouraged and answered.  F/U in 1 month

## 2013-10-19 ENCOUNTER — Telehealth: Payer: Self-pay

## 2013-10-19 NOTE — Telephone Encounter (Signed)
Pt called and states that she was prescribed antibiotics for cellulitis of her leg, and due to taking the antibiotics she now has diarrhea.  The pt also states that she had a doctors appointment with another office and upon examination was told that her leg looked infected.  I spoke with Dr. Jonny Ruiz and asked what his recommendation would be, and he stated that the pt needed to come in for an OV.  The next available office visit wont be until 10/24/2013 and the pt was notified of that fact.  I also notified the pt that if her sx got worse between now and the next week that she either needed to be seen at an urgent care or the hospital.  The pt declined and stated she wants to see Dr. Jonny Ruiz.  Pt states she will call me back and let me know if she will be able to schedule an appt to see Dr. Jonny Ruiz because she has to make sure that she can find a ride to her appt.

## 2013-10-19 NOTE — Telephone Encounter (Signed)
Due to the nature of the problem, please encourage pt to go to UC or ER, or any MD with appt before then, as diarrhea might be infectious and could get worse; if no fever or abd pain or n/v, she could consider immodium otc prn

## 2013-10-19 NOTE — Telephone Encounter (Signed)
Called the patient informed of MD instructions.  The patient refused to go anywhere, but to see her PCP.  Informed to call in the morning to check if there have been any cancellations and try to schedule then if available..  The patient did agree to do so.

## 2013-10-20 NOTE — Telephone Encounter (Signed)
The patient did phone in this morning (10/20/13) to inform no appointments available today.  But that she is doing better this morning, leg is no worse even a little better, had diner last night and was able to keep it down.  Will continue to watch and did agree to MD instructions if her situation does worsen.  Did state appreciation that we called with instructions yesterday.

## 2013-10-23 ENCOUNTER — Telehealth: Payer: Self-pay

## 2013-10-23 NOTE — Telephone Encounter (Signed)
Relevant patient education assigned to patient using Emmi. ° °

## 2013-10-24 ENCOUNTER — Ambulatory Visit (INDEPENDENT_AMBULATORY_CARE_PROVIDER_SITE_OTHER): Payer: Medicare Other | Admitting: Internal Medicine

## 2013-10-24 ENCOUNTER — Other Ambulatory Visit (INDEPENDENT_AMBULATORY_CARE_PROVIDER_SITE_OTHER): Payer: Medicare Other

## 2013-10-24 ENCOUNTER — Other Ambulatory Visit: Payer: Self-pay

## 2013-10-24 ENCOUNTER — Encounter: Payer: Self-pay | Admitting: Internal Medicine

## 2013-10-24 VITALS — BP 120/68 | HR 79 | Temp 98.8°F

## 2013-10-24 DIAGNOSIS — L03116 Cellulitis of left lower limb: Secondary | ICD-10-CM

## 2013-10-24 DIAGNOSIS — L02419 Cutaneous abscess of limb, unspecified: Secondary | ICD-10-CM

## 2013-10-24 DIAGNOSIS — E119 Type 2 diabetes mellitus without complications: Secondary | ICD-10-CM

## 2013-10-24 DIAGNOSIS — I509 Heart failure, unspecified: Secondary | ICD-10-CM | POA: Diagnosis not present

## 2013-10-24 DIAGNOSIS — M2550 Pain in unspecified joint: Secondary | ICD-10-CM

## 2013-10-24 DIAGNOSIS — N189 Chronic kidney disease, unspecified: Secondary | ICD-10-CM

## 2013-10-24 DIAGNOSIS — L03119 Cellulitis of unspecified part of limb: Secondary | ICD-10-CM

## 2013-10-24 LAB — BASIC METABOLIC PANEL
BUN: 16 mg/dL (ref 6–23)
CHLORIDE: 109 meq/L (ref 96–112)
CO2: 23 mEq/L (ref 19–32)
Calcium: 9 mg/dL (ref 8.4–10.5)
Creatinine, Ser: 1.3 mg/dL — ABNORMAL HIGH (ref 0.4–1.2)
GFR: 44.65 mL/min — ABNORMAL LOW (ref >60.00)
GLUCOSE: 96 mg/dL (ref 70–99)
POTASSIUM: 5.2 meq/L — AB (ref 3.5–5.1)
SODIUM: 137 meq/L (ref 135–145)

## 2013-10-24 LAB — SEDIMENTATION RATE: Sed Rate: 50 mm/hr — ABNORMAL HIGH (ref 0–22)

## 2013-10-24 MED ORDER — FUROSEMIDE 40 MG PO TABS: 40.0000 mg | ORAL_TABLET | Freq: Every day | ORAL | Status: DC

## 2013-10-24 MED ORDER — LEVOFLOXACIN 250 MG PO TABS: 250.0000 mg | ORAL_TABLET | Freq: Every day | ORAL | Status: DC

## 2013-10-24 MED ORDER — TOPIRAMATE 100 MG PO TABS: ORAL_TABLET | ORAL | Status: DC

## 2013-10-24 NOTE — Progress Notes (Signed)
Subjective:    Patient ID: Crystal Fitzpatrick, female    DOB: 08-18-1945, 68 y.o.   MRN: [id number]  HPI  Here to f/u, c/o worsening distal anterior LLE red/swelling/tender/warmth despited cephalexin.  Prior to that with ongoing edema not well controlled on lower dose lasix, leg elevation, low salt diet.  Cant wear compression stocking due to pain.   Pt denies polydipsia, polyuria.  Has known CKD, due for f/u  Also with mult joint pain without swelling to MCP's and knees and ankle.  Denies worsening reflux, abd pain, dysphagia, n/v, bowel change or blood, except has ongoing loose stools, better with lomotil Past Medical History  Diagnosis Date  . DIABETES MELLITUS, TYPE II 07/17/2008  . B12 DEFICIENCY 07/17/2008  . Acute gouty arthropathy 12/11/2008  . HYPONATREMIA 03/20/2010  . HYPERKALEMIA 10/31/2009  . ANEMIA-NOS 07/17/2008  . ANXIETY 07/17/2008  . Chronic pain syndrome 02/14/2010  . Trigeminal neuralgia 02/14/2010  . HEARING LOSS, RIGHT EAR 06/04/2009  . HYPERTENSION 07/17/2008  . CHF 01/28/2009  . Pneumonia, organism unspecified 02/14/2010  . GERD 07/17/2008  . IBS 06/04/2009  . UNSPECIFIED HEPATITIS 01/28/2009  . RENAL INSUFFICIENCY 10/31/2009  . MENOPAUSAL DISORDER 12/03/2009  . Cellulitis and abscess of leg, except foot 11/15/2008  . SKIN ULCER, CHRONIC 02/08/2009  . Rheumatoid arthritis(714.0) 07/17/2008  . SKIN LESION 06/04/2009  . ARTHRITIS 01/28/2009  . FOOT PAIN 07/17/2008  . HYPERSOMNIA 02/14/2010  . Altered mental status 02/07/2010  . PERIPHERAL EDEMA 08/30/2008  . Wheezing 12/03/2009  . ABDOMINAL DISTENSION 10/11/2008  . Dysuria 11/11/2009  . Hypoxemia 03/20/2010  . ANAPHYLACTIC SHOCK 05/15/2009  . NEUROPATHY, HX OF 01/28/2009  . COLONIC POLYPS, HX OF 07/17/2008  . AMPUTATION, BELOW KNEE, RIGHT, HX OF 01/28/2009  . Hyperlipemia 09/24/2010  . Hyperlipidemia 09/24/2010  . Seizure 10/26/2010  . Pneumonia, aspiration 12/01/2011    Episode July 2013, tx per [place]   Past Surgical History    Procedure Laterality Date  . Amputation  02/02/2008    below right knee  . S/p egd  2007    with Botox for? Achalasia  . Tonsillectomy    . S/p breast biopsy  1992  . 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 Tubal ligation    . Nasal septum surgery    . Foot surgury  1980 and 1981    reports that she has quit smoking. She has never used smokeless tobacco. She reports that she does not drink alcohol or use illicit drugs. family history includes Breast cancer in her sister; Diabetes in her other; Stroke in her other. Allergies  Allergen Reactions  . Morphine Anaphylaxis  . Sulfonamide Derivatives Diarrhea  . Ciprofloxacin Diarrhea  . Doxycycline     Nausea, diarrhea  . Lyrica [Pregabalin]   . Nitrofurantoin     REACTION: itch  . Prednisone     Fast heart rate  . Erythromycin Rash   Current Outpatient Prescriptions on File Prior to Visit  Medication Sig Dispense Refill  . albuterol (PROAIR HFA) 108 (90 BASE) MCG/ACT inhaler Inhale 2 puffs into the lungs every 6 (six) hours as needed.        . Ascorbic Acid (VITAMIN C) 500 MG tablet Take 500 mg by mouth daily.        [name] atorvastatin (LIPITOR) 10 MG tablet Take 1 tablet (10 mg total) by mouth daily.  90 tablet  3  . BENICAR 40 MG tablet TAKE 1 TABLET  BY MOUTH EVERY DAY FOR BLOOD PRESSURE  30 tablet  11  . Blood Glucose Monitoring Suppl (ONE TOUCH ULTRA 2) W/DEVICE KIT 1 Device by Does not apply route once.  1 each  0  . Blood Glucose Monitoring Suppl (ONE TOUCH ULTRA SYSTEM KIT) W/DEVICE KIT 1 kit by Does not apply route once.  1 each  0  . cetirizine (ZYRTEC) 10 MG tablet TAKE 1 TABLET BY MOUTH EVERY DAY  30 tablet  11  . Cholecalciferol (VITAMIN D) 400 UNITS capsule Take 400 Units by mouth daily.        . clindamycin (CLEOCIN) 300 MG capsule Take 1 capsule (300 mg total) by mouth 3 (three) times daily.  21 capsule  0  . cyclobenzaprine (FLEXERIL) 10 MG tablet TAKE 1 TABLET BY MOUTH EVERY 8 HOURS AS NEEDED 1 month supply  90  tablet  3  . diclofenac sodium (VOLTAREN) 1 % GEL Apply 2 g topically 4 (four) times daily.  3 Tube  3  . DULoxetine (CYMBALTA) 60 MG capsule TAKE 2 CAPSULES BY MOUTH DAILY  60 capsule  3  . EPINEPHrine (EPIPEN 2-PAK) 0.3 mg/0.3 mL DEVI Inject 0.3 mLs (0.3 mg total) into the muscle once. Use as directed  1 Device  0  . EPINEPHrine (EPIPEN) 0.3 mg/0.3 mL IJ SOAJ injection Inject 0.3 mLs (0.3 mg total) into the muscle once.  1 Device  2  . fentaNYL (DURAGESIC - DOSED MCG/HR) 75 MCG/HR Place 1 patch (75 mcg total) onto the skin every 3 (three) days. 1 patch every 3 days  10 patch  0  . gabapentin (NEURONTIN) 600 MG tablet Take 1 tablet (600 mg total) by mouth 3 (three) times daily.  90 tablet  3  . lidocaine (LIDODERM) 5 % Place 1 patch onto the skin daily. Remove & Discard patch within 12 hours or as directed by MD  30 patch  3  . loperamide (ANTI-DIARRHEAL) 2 MG tablet Take 2 mg by mouth 2 (two) times daily as needed.        . meclizine (ANTIVERT) 25 MG tablet Take 1 tablet (25 mg total) by mouth 3 (three) times daily as needed.  30 tablet  0  . Multiple Vitamins-Minerals (CENTRUM SILVER ULTRA WOMENS PO) Take by mouth daily.        . Omega-3 Fatty Acids (FISH OIL) 1000 MG CAPS Take by mouth daily.        Glory Rosebush DELICA LANCETS MISC Use as directed to test blood sugar dx 250.00  100 each  2  . oxyCODONE-acetaminophen (PERCOCET) 10-325 MG per tablet Take 1 tablet by mouth every 6 (six) hours as needed for pain.  120 tablet  0  . pantoprazole (PROTONIX) 40 MG tablet TAKE ONE (1) TABLET BY MOUTH EVERY DAY  FOR ACID REFLUX  30 tablet  11  . promethazine (PHENERGAN) 25 MG tablet TAKE 1 TABLET BY MOUTH EVERY 6 HOURS AS NEEDED FOR NAUSEA  60 tablet  1  . promethazine (PHENERGAN) 25 MG tablet TAKE 1 TABLET BY MOUTH EVERY 6 HOURS AS NEEDED FOR NAUSEA  60 tablet  1  . Psyllium (METAMUCIL) 30.9 % POWD Take by mouth. Take as directed       . topiramate (TOPAMAX) 100 MG tablet TAKE 3 TABLETS BY MOUTH EACH  NIGHT AT BEDTIME  90 tablet  3   No current facility-administered medications on file prior to visit.   Review of Systems  Constitutional: Negative for unusual diaphoresis or other  sweats  HENT: Negative for ringing in ear Eyes: Negative for double vision or worsening visual disturbance.  Respiratory: Negative for choking and stridor.   Gastrointestinal: Negative for vomiting or other signifcant bowel change Genitourinary: Negative for hematuria or decreased urine volume.  Musculoskeletal: Negative for other MSK pain or swelling Skin: Negative for color change and worsening wound.  Neurological: Negative for tremors and numbness other than noted  Psychiatric/Behavioral: Negative for decreased concentration or agitation other than above       Objective:   Physical Exam BP 120/68  Pulse 79  Temp(Src) 98.8 F (37.1 C) (Oral) VS noted,  Constitutional: Pt appears well-developed, well-nourished.  HENT: Head: NCAT.  Right Ear: External ear normal.  Left Ear: External ear normal.  Eyes: . Pupils are equal, round, and reactive to light. Conjunctivae and EOM are normal Neck: Normal range of motion. Neck supple.  Cardiovascular: Normal rate and regular rhythm.   Pulmonary/Chest: Effort normal and breath sounds normal.  S/p right bka Neurological: Pt is alert. Not confused , motor grossly intact Skin: Skin is warm/red/tender/swelling approx 6x4 cm area anterior distal without ulcer, drainage, abscess, 1-2 overall edema to above left knee Psychiatric: Pt behavior is normal. No agitation.  No synovitis, mild tender bilat MCP;s bilat    Assessment & Plan:

## 2013-10-24 NOTE — Patient Instructions (Addendum)
Ok to take the antibioitic as prescribed, and increase the lasix to 40 mg per day  Please continue all other medications as before, and refills have been done if requested.  Please have the pharmacy call with any other refills you may need.  Please continue your efforts at being more active, low cholesterol diet, and weight control.  Please keep your appointments with your specialists as you may have planned  Please go to the LAB in the Basement (turn left off the elevator) for the tests to be done today  You will be contacted by phone if any changes need to be made immediately.  Otherwise, you will receive a letter about your results with an explanation, but please check with MyChart first.  Please remember to sign up for MyChart if you have not done so, as this will be important to you in the future with finding out test results, communicating by private email, and scheduling acute appointments online when needed.  Please return in 3 months, or sooner if needed

## 2013-10-24 NOTE — Progress Notes (Signed)
Pre visit review using our clinic review tool, if applicable. No additional management support is needed unless otherwise documented below in the visit note. 

## 2013-10-25 ENCOUNTER — Telehealth: Payer: Self-pay | Admitting: Internal Medicine

## 2013-10-25 ENCOUNTER — Other Ambulatory Visit: Payer: Self-pay | Admitting: Internal Medicine

## 2013-10-25 LAB — RHEUMATOID FACTOR

## 2013-10-25 LAB — ANA: ANA: NEGATIVE

## 2013-10-25 NOTE — Telephone Encounter (Signed)
Patient states that she had test done re: kidneys.  She wants results.

## 2013-10-25 NOTE — Telephone Encounter (Signed)
Done hardcopy to robin  

## 2013-10-25 NOTE — Telephone Encounter (Signed)
Faxed hardcopy to [facility]. 

## 2013-10-25 NOTE — Telephone Encounter (Signed)
Left message on mychart;  Kidney function is improved, no need for further changes, other tests ok including the Rheumatoid tests

## 2013-10-26 NOTE — Telephone Encounter (Signed)
Called the patient informed of results 

## 2013-10-27 ENCOUNTER — Other Ambulatory Visit: Payer: Self-pay | Admitting: Internal Medicine

## 2013-10-27 NOTE — Telephone Encounter (Signed)
Done hardcopy to robin  

## 2013-10-30 DIAGNOSIS — L03116 Cellulitis of left lower limb: Secondary | ICD-10-CM | POA: Insufficient documentation

## 2013-10-30 DIAGNOSIS — M2550 Pain in unspecified joint: Secondary | ICD-10-CM | POA: Insufficient documentation

## 2013-10-30 NOTE — Assessment & Plan Note (Signed)
For f/u lab, ro worsening fxn as reason for increased edema

## 2013-10-30 NOTE — Assessment & Plan Note (Signed)
Ok for change to levaquin asd,  to f/u any worsening symptoms or concerns

## 2013-10-30 NOTE — Assessment & Plan Note (Signed)
Has dx RA on her dx list, but not clear to me, for rheum labs as documented

## 2013-10-30 NOTE — Assessment & Plan Note (Signed)
stable overall by history and exam, recent data reviewed with pt, and pt to continue medical treatment as before,  to f/u any worsening symptoms or concerns Lab Results  Component Value Date   HGBA1C 6.4 07/25/2013

## 2013-10-30 NOTE — Assessment & Plan Note (Signed)
Diast, for increase lasix 40 qd

## 2013-10-31 NOTE — Telephone Encounter (Signed)
Faxed hardcopy to [facility] 

## 2013-11-02 ENCOUNTER — Other Ambulatory Visit: Payer: Self-pay | Admitting: Internal Medicine

## 2013-11-16 ENCOUNTER — Encounter: Payer: Medicare Other | Attending: Physical Medicine and Rehabilitation | Admitting: Registered Nurse

## 2013-11-16 ENCOUNTER — Encounter: Payer: Self-pay | Admitting: Registered Nurse

## 2013-11-16 VITALS — BP 174/81 | HR 90 | Resp 14 | Ht 70.0 in | Wt 279.0 lb

## 2013-11-16 DIAGNOSIS — M1712 Unilateral primary osteoarthritis, left knee: Secondary | ICD-10-CM

## 2013-11-16 DIAGNOSIS — Z79899 Other long term (current) drug therapy: Secondary | ICD-10-CM

## 2013-11-16 DIAGNOSIS — IMO0002 Reserved for concepts with insufficient information to code with codable children: Secondary | ICD-10-CM | POA: Insufficient documentation

## 2013-11-16 DIAGNOSIS — M4716 Other spondylosis with myelopathy, lumbar region: Secondary | ICD-10-CM | POA: Diagnosis not present

## 2013-11-16 DIAGNOSIS — G609 Hereditary and idiopathic neuropathy, unspecified: Secondary | ICD-10-CM

## 2013-11-16 DIAGNOSIS — S88119A Complete traumatic amputation at level between knee and ankle, unspecified lower leg, initial encounter: Secondary | ICD-10-CM

## 2013-11-16 DIAGNOSIS — M069 Rheumatoid arthritis, unspecified: Secondary | ICD-10-CM | POA: Diagnosis not present

## 2013-11-16 DIAGNOSIS — Z5181 Encounter for therapeutic drug level monitoring: Secondary | ICD-10-CM

## 2013-11-16 DIAGNOSIS — M1710 Unilateral primary osteoarthritis, unspecified knee: Secondary | ICD-10-CM

## 2013-11-16 MED ORDER — OXYCODONE-ACETAMINOPHEN 10-325 MG PO TABS: 1.0000 | ORAL_TABLET | Freq: Four times a day (QID) | ORAL | Status: DC | PRN

## 2013-11-16 MED ORDER — FENTANYL 75 MCG/HR TD PT72: 75.0000 ug | MEDICATED_PATCH | TRANSDERMAL | Status: DC

## 2013-11-16 NOTE — Progress Notes (Signed)
Subjective:    Patient ID: Crystal Fitzpatrick, female    DOB: Jan 18, 1946, 68 y.o.   MRN: [id number]  HPI: Crystal Fitzpatrick is a 68 year old female who returns for follow up for chronic pain and medication refill. She says her pain is located in her lower back, bilateral arms and left leg. Also saying she is having generalized pain all over her body. She rates her pain 8. Her current exercise regime Yoga daily and performing stretching exercises.  She arrived to clinic in wheelchair. [facility] is scheduled for evaluation in July 2015.  She is short on her oxycodone. She says when this young lady brought her medication she was 26 tablets short. She called the pharmacy and she says they said they dispersed 120. She is very tearful and says she is in excruciating pain. We will write for prescription to be filled early. If her insurance won't cover the whole script she is willing to pay cash for a few days. This in agreeable as well. Office satff is aware if the Pharmacy calls.  Due to change in scripts several scripts have been discarded today.  Pain Inventory Average Pain 8 Pain Right Now 8 My pain is sharp, burning and stabbing  In the last 24 hours, has pain interfered with the following? General activity 4 Relation with others 5 Enjoyment of life 6 What TIME of day is your pain at its worst? morning, night Sleep (in general) Poor  Pain is worse with: unsure Pain improves with: medication and elevation Relief from Meds: 6  Mobility ability to climb steps?  no do you drive?  no use a wheelchair Do you have any goals in this area?  yes  Function disabled: date disabled na retired I need assistance with the following:  shopping  Neuro/Psych tremor  Prior Studies Any changes since last visit?  no  Physicians involved in your care Any changes since last visit?  no   Family History  Problem Relation Age of Onset  . Breast cancer Sister     . Diabetes Other   . Stroke Other     Grandparents   History   Social History  . Marital Status: Divorced    Spouse Name: N/A    Number of Children: 1  . Years of Education: N/A   Occupational History  . retired Marketing executive    Social History Main Topics  . Smoking status: Former Games developer  . Smokeless tobacco: Never Used  . Alcohol Use: No  . Drug Use: No  . Sexual Activity: None   Other Topics Concern  . None   Social History Narrative  . None   Past Surgical History  Procedure Laterality Date  . Amputation  02/02/2008    below right knee  . S/p egd  2007    with Botox for? Achalasia  . Tonsillectomy    . S/p breast biopsy  1992  . 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 Tubal ligation    . Nasal septum surgery    . Foot surgury  1980 and 1981   Past Medical History  Diagnosis Date  . DIABETES MELLITUS, TYPE II 07/17/2008  . B12 DEFICIENCY 07/17/2008  . Acute gouty arthropathy 12/11/2008  . HYPONATREMIA 03/20/2010  . HYPERKALEMIA 10/31/2009  . ANEMIA-NOS 07/17/2008  . ANXIETY 07/17/2008  . Chronic pain syndrome 02/14/2010  . Trigeminal neuralgia 02/14/2010  . HEARING LOSS, RIGHT  EAR 06/04/2009  . HYPERTENSION 07/17/2008  . CHF 01/28/2009  . Pneumonia, organism unspecified 02/14/2010  . GERD 07/17/2008  . IBS 06/04/2009  . UNSPECIFIED HEPATITIS 01/28/2009  . RENAL INSUFFICIENCY 10/31/2009  . MENOPAUSAL DISORDER 12/03/2009  . Cellulitis and abscess of leg, except foot 11/15/2008  . SKIN ULCER, CHRONIC 02/08/2009  . Rheumatoid arthritis(714.0) 07/17/2008  . SKIN LESION 06/04/2009  . ARTHRITIS 01/28/2009  . FOOT PAIN 07/17/2008  . HYPERSOMNIA 02/14/2010  . Altered mental status 02/07/2010  . PERIPHERAL EDEMA 08/30/2008  . Wheezing 12/03/2009  . ABDOMINAL DISTENSION 10/11/2008  . Dysuria 11/11/2009  . Hypoxemia 03/20/2010  . ANAPHYLACTIC SHOCK 05/15/2009  . NEUROPATHY, HX OF 01/28/2009  . COLONIC POLYPS, HX OF 07/17/2008  . AMPUTATION, BELOW KNEE, RIGHT, HX OF  01/28/2009  . Hyperlipemia 09/24/2010  . Hyperlipidemia 09/24/2010  . Seizure 10/26/2010  . Pneumonia, aspiration 12/01/2011    Episode July 2013, tx per [facility]   BP 174/81  Pulse 90  Resp 14  Ht 5\' 10"  (1.778 m)  Wt 279 lb (126.554 kg)  BMI 40.03 kg/m2  SpO2 96%  Opioid Risk Score:   Fall Risk Score: Moderate Fall Risk (6-13 points) (pt educated on fall risk, brochure given to pt previously)   Review of Systems  Constitutional: Positive for chills.  Cardiovascular: Positive for leg swelling.  Neurological: Positive for tremors.  All other systems reviewed and are negative.      Objective:   Physical Exam  Nursing note and vitals reviewed. Constitutional: She is oriented to person, place, and time. She appears well-developed and well-nourished.  Obese  HENT:  Head: Normocephalic and atraumatic.  Neck: Normal range of motion. Neck supple.  Cardiovascular: Normal rate and regular rhythm.   Pulmonary/Chest: Effort normal and breath sounds normal.  Musculoskeletal:  Normal Muscle Bulk and Muscle Testing Reveals: Upper Extremities: Full ROM and Muscle Strength 5/5 Hypersensitivity Noted: Thoracic and Lumbar Lower Extremities: Right BKA Left Lower Leg with Cellulitis and Pitting Edema. Unable to Flex, Arrived in wheelchair  Neurological: She is alert and oriented to person, place, and time.  Skin: Skin is warm and dry.  Psychiatric: She has a normal mood and affect.          Assessment & Plan:  1. Right below-knee amputation, history of phantom limb pain.: She is not wearing her sleeve. No complaints at this time regarding phantom pain.Continue Current Medication Regime. Continue to Monitor.  2. Peripheral neuropathy: Continue Gabapentin.  Refilled: Fenatnyl Patch 75 mcg one patch every three days #10 and Oxycodone 10/325mg  one tablet every 6 hours as needed #120  3. History of rheumatoid arthritis. Continue Current Medication and Exercise and heat  Therapy.  4. Osteoarthritis of left knee: Continue with Voltaren gel/ Exercise and heat therapy.  5. Chronic cellulitis, left leg with associated edema.: F/U PMD   30 minutes of face to face patient care time was spent during this visit. All questions were encouraged and answered.   F/U in 1 month

## 2013-11-20 ENCOUNTER — Other Ambulatory Visit: Payer: Self-pay | Admitting: Internal Medicine

## 2013-11-21 ENCOUNTER — Other Ambulatory Visit: Payer: Self-pay

## 2013-11-21 MED ORDER — GABAPENTIN 600 MG PO TABS: 600.0000 mg | ORAL_TABLET | Freq: Three times a day (TID) | ORAL | Status: DC

## 2013-11-30 ENCOUNTER — Ambulatory Visit: Payer: Medicare Other | Attending: Physical Medicine & Rehabilitation | Admitting: Physical Therapy

## 2013-11-30 DIAGNOSIS — IMO0002 Reserved for concepts with insufficient information to code with codable children: Secondary | ICD-10-CM | POA: Insufficient documentation

## 2013-11-30 DIAGNOSIS — IMO0001 Reserved for inherently not codable concepts without codable children: Secondary | ICD-10-CM | POA: Diagnosis not present

## 2013-11-30 DIAGNOSIS — M6281 Muscle weakness (generalized): Secondary | ICD-10-CM | POA: Diagnosis not present

## 2013-11-30 DIAGNOSIS — M1710 Unilateral primary osteoarthritis, unspecified knee: Secondary | ICD-10-CM | POA: Diagnosis not present

## 2013-11-30 DIAGNOSIS — S88119A Complete traumatic amputation at level between knee and ankle, unspecified lower leg, initial encounter: Secondary | ICD-10-CM | POA: Insufficient documentation

## 2013-11-30 DIAGNOSIS — M069 Rheumatoid arthritis, unspecified: Secondary | ICD-10-CM | POA: Insufficient documentation

## 2013-11-30 DIAGNOSIS — G609 Hereditary and idiopathic neuropathy, unspecified: Secondary | ICD-10-CM | POA: Insufficient documentation

## 2013-11-30 DIAGNOSIS — Z993 Dependence on wheelchair: Secondary | ICD-10-CM | POA: Diagnosis not present

## 2013-12-08 ENCOUNTER — Ambulatory Visit: Payer: Medicare Other | Admitting: Physical Medicine & Rehabilitation

## 2013-12-15 ENCOUNTER — Encounter (HOSPITAL_BASED_OUTPATIENT_CLINIC_OR_DEPARTMENT_OTHER): Payer: Medicare Other | Admitting: Registered Nurse

## 2013-12-15 ENCOUNTER — Encounter: Payer: Self-pay | Admitting: Registered Nurse

## 2013-12-15 VITALS — BP 114/72 | HR 93 | Resp 14

## 2013-12-15 DIAGNOSIS — G609 Hereditary and idiopathic neuropathy, unspecified: Secondary | ICD-10-CM | POA: Diagnosis not present

## 2013-12-15 DIAGNOSIS — M069 Rheumatoid arthritis, unspecified: Secondary | ICD-10-CM

## 2013-12-15 DIAGNOSIS — IMO0002 Reserved for concepts with insufficient information to code with codable children: Secondary | ICD-10-CM

## 2013-12-15 DIAGNOSIS — M4716 Other spondylosis with myelopathy, lumbar region: Secondary | ICD-10-CM

## 2013-12-15 DIAGNOSIS — S88119A Complete traumatic amputation at level between knee and ankle, unspecified lower leg, initial encounter: Secondary | ICD-10-CM

## 2013-12-15 DIAGNOSIS — Z79899 Other long term (current) drug therapy: Secondary | ICD-10-CM

## 2013-12-15 DIAGNOSIS — M1710 Unilateral primary osteoarthritis, unspecified knee: Secondary | ICD-10-CM

## 2013-12-15 DIAGNOSIS — Z5181 Encounter for therapeutic drug level monitoring: Secondary | ICD-10-CM

## 2013-12-15 DIAGNOSIS — M1712 Unilateral primary osteoarthritis, left knee: Secondary | ICD-10-CM

## 2013-12-15 MED ORDER — OXYCODONE-ACETAMINOPHEN 10-325 MG PO TABS: 1.0000 | ORAL_TABLET | Freq: Four times a day (QID) | ORAL | Status: DC | PRN

## 2013-12-15 MED ORDER — FENTANYL 75 MCG/HR TD PT72: 75.0000 ug | MEDICATED_PATCH | TRANSDERMAL | Status: DC

## 2013-12-15 NOTE — Progress Notes (Signed)
Subjective:    Patient ID: Crystal Fitzpatrick, female    DOB: 30-Mar-1946, 68 y.o.   MRN: [id number]  HPI: [name] is a 68 year old female who returns for follow up for chronic pain and medication refill. She says her pain is located in her lower back, left hip and left leg. She rates her pain 7. Her current exercise regime is Yoga three times a day and performing stretching exercises.  She arrived to clinic in wheelchair.  She says she had her motorized wheelchair evaluation this month. She won't qualify for a motorized wheelchair/ She is okay with this decision. Left Lower Extremity with cellulitis noted she will be following up with her PCP today.  Pain Inventory Average Pain 6 Pain Right Now 7 My pain is sharp, burning and stabbing  In the last 24 hours, has pain interfered with the following? General activity 3 Relation with others 1 Enjoyment of life 2 What TIME of day is your pain at its worst? morning and night Sleep (in general) Poor  Pain is worse with: sitting and inactivity Pain improves with: rest, therapy/exercise and medication Relief from Meds: unanswered  Mobility use a wheelchair Do you have any goals in this area?  yes  Function Do you have any goals in this area?  no  Neuro/Psych tremor  Prior Studies Any changes since last visit?  no  Physicians involved in your care Any changes since last visit?  no   Family History  Problem Relation Age of Onset  . Breast cancer Sister   . Diabetes Other   . Stroke Other     Grandparents   History   Social History  . Marital Status: Divorced    Spouse Name: N/A    Number of Children: 1  . Years of Education: N/A   Occupational History  . retired Marketing executive    Social History Main Topics  . Smoking status: Former Games developer  . Smokeless tobacco: Never Used  . Alcohol Use: No  . Drug Use: No  . Sexual Activity: None   Other Topics Concern  . None   Social History Narrative    . None   Past Surgical History  Procedure Laterality Date  . Amputation  02/02/2008    below right knee  . S/p egd  2007    with Botox for? Achalasia  . Tonsillectomy    . S/p breast biopsy  1992  . 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 Tubal ligation    . Nasal septum surgery    . Foot surgury  1980 and 1981   Past Medical History  Diagnosis Date  . DIABETES MELLITUS, TYPE II 07/17/2008  . B12 DEFICIENCY 07/17/2008  . Acute gouty arthropathy 12/11/2008  . HYPONATREMIA 03/20/2010  . HYPERKALEMIA 10/31/2009  . ANEMIA-NOS 07/17/2008  . ANXIETY 07/17/2008  . Chronic pain syndrome 02/14/2010  . Trigeminal neuralgia 02/14/2010  . HEARING LOSS, RIGHT EAR 06/04/2009  . HYPERTENSION 07/17/2008  . CHF 01/28/2009  . Pneumonia, organism unspecified 02/14/2010  . GERD 07/17/2008  . IBS 06/04/2009  . UNSPECIFIED HEPATITIS 01/28/2009  . RENAL INSUFFICIENCY 10/31/2009  . MENOPAUSAL DISORDER 12/03/2009  . Cellulitis and abscess of leg, except foot 11/15/2008  . SKIN ULCER, CHRONIC 02/08/2009  . Rheumatoid arthritis(714.0) 07/17/2008  . SKIN LESION 06/04/2009  . ARTHRITIS 01/28/2009  . FOOT PAIN 07/17/2008  . HYPERSOMNIA 02/14/2010  . Altered mental status 02/07/2010  . PERIPHERAL EDEMA  08/30/2008  . Wheezing 12/03/2009  . ABDOMINAL DISTENSION 10/11/2008  . Dysuria 11/11/2009  . Hypoxemia 03/20/2010  . ANAPHYLACTIC SHOCK 05/15/2009  . NEUROPATHY, HX OF 01/28/2009  . COLONIC POLYPS, HX OF 07/17/2008  . AMPUTATION, BELOW KNEE, RIGHT, HX OF 01/28/2009  . Hyperlipemia 09/24/2010  . Hyperlipidemia 09/24/2010  . Seizure 10/26/2010  . Pneumonia, aspiration 12/01/2011    Episode July 2013, tx per [facility]   BP 114/72  Pulse 93  Resp 14  SpO2 93%  Opioid Risk Score:   Fall Risk Score: Moderate Fall Risk (6-13 points) (previously educated and given handout) Review of Systems  Skin: Positive for color change and wound.       Cellulitis is back in her left leg  All other systems reviewed and are  negative.      Objective:   Physical Exam  Nursing note and vitals reviewed. Constitutional: She is oriented to person, place, and time. She appears well-developed and well-nourished.  HENT:  Head: Normocephalic and atraumatic.  Neck: Normal range of motion. Neck supple.  Cardiovascular: Normal rate and regular rhythm.   Pulmonary/Chest: Effort normal and breath sounds normal.  Musculoskeletal:  Normal Muscle Bulk and Muscle Testing Reveals:  Upper Extremities: Full ROM and Muscle Strength 5/5 Lumbar Paraspinal Tenderness: L-3- L-5 Lower Extremities: Right BKA with Sleeve intact Left Leg: Full ROM and Muscle Strength 5/5 Left Lower Leg with Cellulitis noted Arrived in Wheelchair  Neurological: She is alert and oriented to person, place, and time.  Skin: Skin is warm and dry.  Cellulitis Left lower Extremitiy  Psychiatric: She has a normal mood and affect.          Assessment & Plan:  1. Right below-knee amputation, history of phantom limb pain.: She is wearing her sleeve. No complaints at this time regarding phantom pain.Continue Current Medication Regime. Continue to Monitor.  2. Peripheral neuropathy: Continue Gabapentin.  Refilled: Fenatnyl Patch 75 mcg one patch every three days #10 and Oxycodone 10/325mg  one tablet every 6 hours as needed #120  3. History of rheumatoid arthritis. Continue Current Medication and Exercise and heat Therapy.  4. Osteoarthritis of left knee: Continue with Voltaren gel/ Exercise and heat therapy.  5. Chronic cellulitis, left leg with associated edema.: F/U PMD   20 minutes of face to face patient care time was spent during this visit. All questions were encouraged and answered.   F/U in 1 month

## 2013-12-26 ENCOUNTER — Telehealth: Payer: Self-pay

## 2013-12-26 MED ORDER — LEVOFLOXACIN 250 MG PO TABS: ORAL_TABLET | ORAL | Status: DC

## 2013-12-26 NOTE — Telephone Encounter (Signed)
We normally try not to rx antibiotic without OV, but ok this time  Pt needs to keep appt for later this wk please

## 2013-12-26 NOTE — Telephone Encounter (Signed)
Called the patient informed prescription sent in. 

## 2013-12-26 NOTE — Telephone Encounter (Signed)
The patients cellulitis has returned in her leg.  The patient has scheduled an appointment with PCP this coming Friday.  She would like to know if she can get a refill on the levaquin as this helped so much to clear up the cellulitis previously.  States her leg is swelling and she is afraid if she waits until Friday it will only worsen without medications now.  Call back number [phone]

## 2013-12-28 ENCOUNTER — Other Ambulatory Visit: Payer: Self-pay

## 2013-12-28 MED ORDER — DULOXETINE HCL 60 MG PO CPEP: ORAL_CAPSULE | ORAL | Status: DC

## 2013-12-29 ENCOUNTER — Ambulatory Visit: Payer: Medicare Other | Admitting: Internal Medicine

## 2014-01-04 ENCOUNTER — Ambulatory Visit (INDEPENDENT_AMBULATORY_CARE_PROVIDER_SITE_OTHER): Payer: Medicare Other | Admitting: Internal Medicine

## 2014-01-04 ENCOUNTER — Encounter: Payer: Self-pay | Admitting: Internal Medicine

## 2014-01-04 VITALS — BP 108/70 | HR 83 | Temp 99.5°F

## 2014-01-04 DIAGNOSIS — L03119 Cellulitis of unspecified part of limb: Secondary | ICD-10-CM | POA: Diagnosis not present

## 2014-01-04 DIAGNOSIS — L03116 Cellulitis of left lower limb: Secondary | ICD-10-CM

## 2014-01-04 DIAGNOSIS — I10 Essential (primary) hypertension: Secondary | ICD-10-CM

## 2014-01-04 DIAGNOSIS — E785 Hyperlipidemia, unspecified: Secondary | ICD-10-CM

## 2014-01-04 DIAGNOSIS — L02419 Cutaneous abscess of limb, unspecified: Secondary | ICD-10-CM

## 2014-01-04 MED ORDER — OMEGA-3-ACID ETHYL ESTERS 1 G PO CAPS: 1.0000 g | ORAL_CAPSULE | Freq: Every day | ORAL | Status: DC

## 2014-01-04 MED ORDER — CLINDAMYCIN HCL 300 MG PO CAPS: 300.0000 mg | ORAL_CAPSULE | Freq: Three times a day (TID) | ORAL | Status: DC

## 2014-01-04 NOTE — Progress Notes (Signed)
Pre visit review using our clinic review tool, if applicable. No additional management support is needed unless otherwise documented below in the visit note. 

## 2014-01-04 NOTE — Patient Instructions (Signed)
Please take all new medication as prescribed- the antibiotic, and the lovaza  Please continue all other medications as before, and refills have been done if requested.  Please have the pharmacy call with any other refills you may need.  Please keep your appointments with your specialists as you may have planned  Please go to ER if your red/swelling/pain/fever gets worse

## 2014-01-04 NOTE — Progress Notes (Signed)
Subjective:    Patient ID: Crystal Fitzpatrick, female    DOB: 01-16-1946, 68 y.o.   MRN: [id number]  HPI  Here to f/u, with c/o low grade temp, LLE area red, swelling, tender x 3-4 days.  No chills, but some general weakness.   Left calf has swollen from usual 16 to 22 inches. Not resolved with 10 days levaquin. Had increased gabapentin per Dr Tessa Lerner for RLE pain, which has helped. Not taking lipitor "just cant take it."  Would like to try lovaza. Pt denies chest pain, increased sob or doe, wheezing, orthopnea, PND, increased LE swelling, palpitations, dizziness or syncope.  Pt denies polydipsia, polyuria  Past Medical History  Diagnosis Date  . DIABETES MELLITUS, TYPE II 07/17/2008  . B12 DEFICIENCY 07/17/2008  . Acute gouty arthropathy 12/11/2008  . HYPONATREMIA 03/20/2010  . HYPERKALEMIA 10/31/2009  . ANEMIA-NOS 07/17/2008  . ANXIETY 07/17/2008  . Chronic pain syndrome 02/14/2010  . Trigeminal neuralgia 02/14/2010  . HEARING LOSS, RIGHT EAR 06/04/2009  . HYPERTENSION 07/17/2008  . CHF 01/28/2009  . Pneumonia, organism unspecified 02/14/2010  . GERD 07/17/2008  . IBS 06/04/2009  . UNSPECIFIED HEPATITIS 01/28/2009  . RENAL INSUFFICIENCY 10/31/2009  . MENOPAUSAL DISORDER 12/03/2009  . Cellulitis and abscess of leg, except foot 11/15/2008  . SKIN ULCER, CHRONIC 02/08/2009  . Rheumatoid arthritis(714.0) 07/17/2008  . SKIN LESION 06/04/2009  . ARTHRITIS 01/28/2009  . FOOT PAIN 07/17/2008  . HYPERSOMNIA 02/14/2010  . Altered mental status 02/07/2010  . PERIPHERAL EDEMA 08/30/2008  . Wheezing 12/03/2009  . ABDOMINAL DISTENSION 10/11/2008  . Dysuria 11/11/2009  . Hypoxemia 03/20/2010  . ANAPHYLACTIC SHOCK 05/15/2009  . NEUROPATHY, HX OF 01/28/2009  . COLONIC POLYPS, HX OF 07/17/2008  . AMPUTATION, BELOW KNEE, RIGHT, HX OF 01/28/2009  . Hyperlipemia 09/24/2010  . Hyperlipidemia 09/24/2010  . Seizure 10/26/2010  . Pneumonia, aspiration 12/01/2011    Episode July 2013, tx per [facility]   Past Surgical  History  Procedure Laterality Date  . Amputation  02/02/2008    below right knee  . S/p egd  2007    with Botox for? Achalasia  . Tonsillectomy    . S/p breast biopsy  1992  . 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 Tubal ligation    . Nasal septum surgery    . Foot surgury  1980 and 1981    reports that she has quit smoking. She has never used smokeless tobacco. She reports that she does not drink alcohol or use illicit drugs. family history includes Breast cancer in her sister; Diabetes in her other; Stroke in her other. Allergies  Allergen Reactions  . Morphine Anaphylaxis  . Sulfonamide Derivatives Diarrhea  . Ciprofloxacin Diarrhea  . Doxycycline     Nausea, diarrhea  . Lyrica [Pregabalin]   . Nitrofurantoin     REACTION: itch  . Prednisone     Fast heart rate  . Erythromycin Rash   Current Outpatient Prescriptions on File Prior to Visit  Medication Sig Dispense Refill  . albuterol (PROAIR HFA) 108 (90 BASE) MCG/ACT inhaler Inhale 2 puffs into the lungs every 6 (six) hours as needed.        . ALPRAZolam (XANAX) 0.25 MG tablet TAKE ONE TABLET 3 TIMES A DAY AS NEEDED  90 tablet  2  . Ascorbic Acid (VITAMIN C) 500 MG tablet Take 500 mg by mouth daily.        [name] atorvastatin (LIPITOR) 10  MG tablet Take 1 tablet (10 mg total) by mouth daily.  90 tablet  3  . BENICAR 40 MG tablet TAKE 1 TABLET BY MOUTH EVERY DAY FOR BLOOD PRESSURE  30 tablet  11  . Blood Glucose Monitoring Suppl (ONE TOUCH ULTRA 2) W/DEVICE KIT 1 Device by Does not apply route once.  1 each  0  . Blood Glucose Monitoring Suppl (ONE TOUCH ULTRA SYSTEM KIT) W/DEVICE KIT 1 kit by Does not apply route once.  1 each  0  . cetirizine (ZYRTEC) 10 MG tablet TAKE 1 TABLET BY MOUTH EVERY DAY  30 tablet  11  . Cholecalciferol (VITAMIN D) 400 UNITS capsule Take 400 Units by mouth daily.        . cyclobenzaprine (FLEXERIL) 10 MG tablet TAKE 1 TABLET BY MOUTH EVERY 8 HOURS AS NEEDED 1 month supply  90 tablet  3  .  diclofenac sodium (VOLTAREN) 1 % GEL Apply 2 g topically 4 (four) times daily.  3 Tube  3  . diphenoxylate-atropine (LOMOTIL) 2.5-0.025 MG per tablet TAKE 1 TABLET BY MOUTH 2 TIMES A DAY AS NEEDED  60 tablet  0  . DULoxetine (CYMBALTA) 60 MG capsule TAKE 2 CAPSULES BY MOUTH DAILY  60 capsule  3  . EPINEPHrine (EPIPEN 2-PAK) 0.3 mg/0.3 mL DEVI Inject 0.3 mLs (0.3 mg total) into the muscle once. Use as directed  1 Device  0  . EPINEPHrine (EPIPEN) 0.3 mg/0.3 mL IJ SOAJ injection Inject 0.3 mLs (0.3 mg total) into the muscle once.  1 Device  2  . fentaNYL (DURAGESIC - DOSED MCG/HR) 75 MCG/HR Place 1 patch (75 mcg total) onto the skin every 3 (three) days. 1 patch every 3 days  10 patch  0  . furosemide (LASIX) 40 MG tablet Take 1 tablet (40 mg total) by mouth daily.  90 tablet  3  . gabapentin (NEURONTIN) 600 MG tablet Take 1 tablet (600 mg total) by mouth 3 (three) times daily.  90 tablet  3  . lidocaine (LIDODERM) 5 % Place 1 patch onto the skin daily. Remove & Discard patch within 12 hours or as directed by MD  30 patch  3  . loperamide (ANTI-DIARRHEAL) 2 MG tablet Take 2 mg by mouth 2 (two) times daily as needed.        . meclizine (ANTIVERT) 25 MG tablet Take 1 tablet (25 mg total) by mouth 3 (three) times daily as needed.  30 tablet  0  . Multiple Vitamins-Minerals (CENTRUM SILVER ULTRA WOMENS PO) Take by mouth daily.        . Omega-3 Fatty Acids (FISH OIL) 1000 MG CAPS Take by mouth daily.        Glory Rosebush DELICA LANCETS MISC Use as directed to test blood sugar dx 250.00  100 each  2  . oxyCODONE-acetaminophen (PERCOCET) 10-325 MG per tablet Take 1 tablet by mouth every 6 (six) hours as needed for pain.  120 tablet  0  . pantoprazole (PROTONIX) 40 MG tablet TAKE ONE (1) TABLET BY MOUTH EVERY DAY  FOR ACID REFLUX  30 tablet  11  . promethazine (PHENERGAN) 25 MG tablet TAKE 1 TABLET BY MOUTH EVERY 6 HOURS AS NEEDED FOR NAUSEA  60 tablet  1  . promethazine (PHENERGAN) 25 MG tablet TAKE ONE (1)  TABLET EVERY 6 HOURS AS NEEDED FOR NAUSEA  60 tablet  1  . Psyllium (METAMUCIL) 30.9 % POWD Take by mouth. Take as directed       .  topiramate (TOPAMAX) 100 MG tablet TAKE 3 TABLETS BY MOUTH EACH NIGHT AT BEDTIME  90 tablet  3   No current facility-administered medications on file prior to visit.   Review of Systems  Constitutional: Negative for unusual diaphoresis or other sweats  HENT: Negative for ringing in ear Eyes: Negative for double vision or worsening visual disturbance.  Respiratory: Negative for choking and stridor.   Gastrointestinal: Negative for vomiting or other signifcant bowel change Genitourinary: Negative for hematuria or decreased urine volume.  Musculoskeletal: Negative for other MSK pain or swelling Skin: Negative for color change and worsening wound.  Neurological: Negative for tremors and numbness other than noted  Psychiatric/Behavioral: Negative for decreased concentration or agitation other than above       Objective:   Physical Exam BP 108/70  Pulse 83  Temp(Src) 99.5 F (37.5 C) (Oral)  SpO2 94%\ VS noted,  Constitutional: Pt appears well-developed, well-nourished.  HENT: Head: NCAT.  Right Ear: External ear normal.  Left Ear: External ear normal.  Eyes: . Pupils are equal, round, and reactive to light. Conjunctivae and EOM are normal Neck: Normal range of motion. Neck supple.  Cardiovascular: Normal rate and regular rhythm.   Pulmonary/Chest: Effort normal and breath sounds normal.  Neurological: Pt is alert. Not confused , motor grossly intact Skin: Skin is warm, red, 2-3+ swelling distal third leg anteriorly approx 8 x 3 cm area, no drainage or abscess Psychiatric: Pt behavior is normal. No agitation.      Assessment & Plan:

## 2014-01-07 NOTE — Assessment & Plan Note (Signed)
Mild to mod, for antibx course,  to f/u any worsening symptoms or concerns 

## 2014-01-07 NOTE — Assessment & Plan Note (Addendum)
For lovaza 1 gm qd, f/u lipids next visit  Lab Results  Component Value Date   CHOL 224* 07/25/2013   HDL 32.00* 07/25/2013   LDLCALC 128* 07/25/2013   LDLDIRECT 135.0 12/12/2012   TRIG 319.0* 07/25/2013   CHOLHDL 7 07/25/2013

## 2014-01-07 NOTE — Assessment & Plan Note (Signed)
Borderline low, asympt, cont same tx,  to f/u any worsening symptoms or concerns BP Readings from Last 3 Encounters:  01/04/14 108/70  12/15/13 114/72  11/16/13 174/81

## 2014-01-08 ENCOUNTER — Telehealth: Payer: Self-pay

## 2014-01-08 MED ORDER — CEPHALEXIN 500 MG PO CAPS: 500.0000 mg | ORAL_CAPSULE | Freq: Four times a day (QID) | ORAL | Status: DC

## 2014-01-08 NOTE — Telephone Encounter (Signed)
OK to try change to keflex po as she is allergic/intol to septra and doxycycline  Pt would need to go to ER for any worsening as she would need IV medication if this happens

## 2014-01-08 NOTE — Telephone Encounter (Signed)
Pt called stating that she cannot tolerate the clindamycin 300 mg tid, and that it is giving her severe diarrhea. Please advise

## 2014-01-08 NOTE — Telephone Encounter (Signed)
Pt.notified

## 2014-01-12 ENCOUNTER — Encounter: Payer: Medicare Other | Attending: Physical Medicine and Rehabilitation | Admitting: Registered Nurse

## 2014-01-12 ENCOUNTER — Encounter: Payer: Self-pay | Admitting: Registered Nurse

## 2014-01-12 VITALS — BP 135/62 | HR 88 | Resp 14 | Ht 70.0 in

## 2014-01-12 DIAGNOSIS — M1710 Unilateral primary osteoarthritis, unspecified knee: Secondary | ICD-10-CM | POA: Diagnosis present

## 2014-01-12 DIAGNOSIS — IMO0002 Reserved for concepts with insufficient information to code with codable children: Secondary | ICD-10-CM | POA: Diagnosis not present

## 2014-01-12 DIAGNOSIS — G609 Hereditary and idiopathic neuropathy, unspecified: Secondary | ICD-10-CM

## 2014-01-12 DIAGNOSIS — S88119A Complete traumatic amputation at level between knee and ankle, unspecified lower leg, initial encounter: Secondary | ICD-10-CM | POA: Diagnosis not present

## 2014-01-12 DIAGNOSIS — M4716 Other spondylosis with myelopathy, lumbar region: Secondary | ICD-10-CM

## 2014-01-12 DIAGNOSIS — M069 Rheumatoid arthritis, unspecified: Secondary | ICD-10-CM

## 2014-01-12 DIAGNOSIS — Z5181 Encounter for therapeutic drug level monitoring: Secondary | ICD-10-CM

## 2014-01-12 DIAGNOSIS — Z79899 Other long term (current) drug therapy: Secondary | ICD-10-CM

## 2014-01-12 DIAGNOSIS — M1712 Unilateral primary osteoarthritis, left knee: Secondary | ICD-10-CM

## 2014-01-12 MED ORDER — OXYCODONE-ACETAMINOPHEN 10-325 MG PO TABS: 1.0000 | ORAL_TABLET | Freq: Four times a day (QID) | ORAL | Status: DC | PRN

## 2014-01-12 MED ORDER — FENTANYL 75 MCG/HR TD PT72: 75.0000 ug | MEDICATED_PATCH | TRANSDERMAL | Status: DC

## 2014-01-12 NOTE — Progress Notes (Signed)
Subjective:    Patient ID: Crystal Fitzpatrick, female    DOB: Apr 22, 1946, 68 y.o.   MRN: [id number]  HPI: Ms. Lakhia Gengler is a 68 year old female who returns for follow up for chronic pain and medication refill. She says her pain is located in her neck, and left leg. She rates her pain 6. Her current exercise regime is Yoga seven days a week. She arrived to clinic in wheelchair.   Pain Inventory Average Pain 7 Pain Right Now 6 My pain is intermittent, sharp, burning, stabbing and aching  In the last 24 hours, has pain interfered with the following? General activity 5 Relation with others 2 Enjoyment of life 1 What TIME of day is your pain at its worst? morning, night Sleep (in general) Fair  Pain is worse with: inactivity Pain improves with: heat/ice and medication Relief from Meds: 7  Mobility how many minutes can you walk? 0 use a wheelchair Do you have any goals in this area?  yes  Function Do you have any goals in this area?  no  Neuro/Psych anxiety  Prior Studies Any changes since last visit?  yes  Physicians involved in your care Any changes since last visit?  yes Primary care Dr. Jonny Ruiz   Family History  Problem Relation Age of Onset  . Breast cancer Sister   . Diabetes Other   . Stroke Other     Grandparents   History   Social History  . Marital Status: Divorced    Spouse Name: N/A    Number of Children: 1  . Years of Education: N/A   Occupational History  . retired Marketing executive    Social History Main Topics  . Smoking status: Former Games developer  . Smokeless tobacco: Never Used  . Alcohol Use: No  . Drug Use: No  . Sexual Activity: None   Other Topics Concern  . None   Social History Narrative  . None   Past Surgical History  Procedure Laterality Date  . Amputation  02/02/2008    below right knee  . S/p egd  2007    with Botox for? Achalasia  . Tonsillectomy    . S/p breast biopsy  1992  . S/p bka  9/09    For DFU and  Osteomyelitis  . Tubal ligation    . Nasal septum surgery    . Foot surgury  1980 and 1981   Past Medical History  Diagnosis Date  . DIABETES MELLITUS, TYPE II 07/17/2008  . B12 DEFICIENCY 07/17/2008  . Acute gouty arthropathy 12/11/2008  . HYPONATREMIA 03/20/2010  . HYPERKALEMIA 10/31/2009  . ANEMIA-NOS 07/17/2008  . ANXIETY 07/17/2008  . Chronic pain syndrome 02/14/2010  . Trigeminal neuralgia 02/14/2010  . HEARING LOSS, RIGHT EAR 06/04/2009  . HYPERTENSION 07/17/2008  . CHF 01/28/2009  . Pneumonia, organism unspecified 02/14/2010  . GERD 07/17/2008  . IBS 06/04/2009  . UNSPECIFIED HEPATITIS 01/28/2009  . RENAL INSUFFICIENCY 10/31/2009  . MENOPAUSAL DISORDER 12/03/2009  . Cellulitis and abscess of leg, except foot 11/15/2008  . SKIN ULCER, CHRONIC 02/08/2009  . Rheumatoid arthritis(714.0) 07/17/2008  . SKIN LESION 06/04/2009  . ARTHRITIS 01/28/2009  . FOOT PAIN 07/17/2008  . HYPERSOMNIA 02/14/2010  . Altered mental status 02/07/2010  . PERIPHERAL EDEMA 08/30/2008  . Wheezing 12/03/2009  . ABDOMINAL DISTENSION 10/11/2008  . Dysuria 11/11/2009  . Hypoxemia 03/20/2010  . ANAPHYLACTIC SHOCK 05/15/2009  . NEUROPATHY, HX OF 01/28/2009  . COLONIC POLYPS, HX OF 07/17/2008  .  AMPUTATION, BELOW KNEE, RIGHT, HX OF 01/28/2009  . Hyperlipemia 09/24/2010  . Hyperlipidemia 09/24/2010  . Seizure 10/26/2010  . Pneumonia, aspiration 12/01/2011    Episode July 2013, tx per [facility]   BP 135/62  Pulse 88  Resp 14  Ht 5\' 10"  (1.778 m)  SpO2 95%  Opioid Risk Score:   Fall Risk Score: Moderate Fall Risk (6-13 points) (pt educated declined handout)    Review of Systems  Constitutional: Positive for chills.  Cardiovascular: Positive for leg swelling.  Psychiatric/Behavioral: The patient is nervous/anxious.   All other systems reviewed and are negative.      Objective:   Physical Exam  Nursing note and vitals reviewed. Constitutional: She is oriented to person, place, and time. She appears  well-developed and well-nourished.  HENT:  Head: Normocephalic and atraumatic.  Neck: Normal range of motion. Neck supple.  Cervical Parspinal tenderness: C-3- C-5  Cardiovascular: Normal rate and regular rhythm.   Pulmonary/Chest: Effort normal and breath sounds normal.  Musculoskeletal:  Normal Muscle Bulk and Muscle testing Reveals:  Upper Extremities: Full ROM and Muscle Strength 5/5 Thoracic Hypersensitivity Lumbar Paraspinal Tenderness: L-3- L-5 Lower Extremities: Right BKA No shrinker on Left Lower Extremity with Cellulits  In Wheelchair  Neurological: She is alert and oriented to person, place, and time.  Skin: Skin is warm and dry.  Psychiatric: She has a normal mood and affect.          Assessment & Plan:  1.Right below-knee amputation, history of phantom limb pain.:  No complaints at this time regarding phantom pain.Continue Current Medication Regime. Continue to Monitor.  2. Peripheral neuropathy: Continue Gabapentin.  Refilled: Fenatnyl Patch 75 mcg one patch every three days #10 and Oxycodone 10/325mg  one tablet every 6 hours as needed #120  3. History of rheumatoid arthritis. Continue Current Medication and Exercise and heat Therapy.  4. Osteoarthritis of left knee: Continue with Voltaren gel/ Exercise and heat therapy.  5. Chronic cellulitis, left leg with associated edema: On Antibiotics. PMD Following  20 minutes of face to face patient care time was spent during this visit. All questions were encouraged and answered.   F/U in 1 month

## 2014-01-15 ENCOUNTER — Ambulatory Visit: Payer: Medicare Other | Admitting: Registered Nurse

## 2014-01-18 DIAGNOSIS — L03211 Cellulitis of face: Secondary | ICD-10-CM | POA: Diagnosis not present

## 2014-01-18 DIAGNOSIS — Z9981 Dependence on supplemental oxygen: Secondary | ICD-10-CM | POA: Diagnosis not present

## 2014-01-18 DIAGNOSIS — I10 Essential (primary) hypertension: Secondary | ICD-10-CM | POA: Diagnosis not present

## 2014-01-18 DIAGNOSIS — L0201 Cutaneous abscess of face: Secondary | ICD-10-CM | POA: Diagnosis not present

## 2014-01-18 DIAGNOSIS — E669 Obesity, unspecified: Secondary | ICD-10-CM | POA: Diagnosis not present

## 2014-01-18 DIAGNOSIS — I509 Heart failure, unspecified: Secondary | ICD-10-CM | POA: Diagnosis not present

## 2014-01-18 DIAGNOSIS — I890 Lymphedema, not elsewhere classified: Secondary | ICD-10-CM | POA: Diagnosis not present

## 2014-01-18 DIAGNOSIS — I872 Venous insufficiency (chronic) (peripheral): Secondary | ICD-10-CM | POA: Diagnosis not present

## 2014-01-18 DIAGNOSIS — I499 Cardiac arrhythmia, unspecified: Secondary | ICD-10-CM | POA: Diagnosis not present

## 2014-01-18 DIAGNOSIS — Z85820 Personal history of malignant melanoma of skin: Secondary | ICD-10-CM | POA: Diagnosis not present

## 2014-01-18 DIAGNOSIS — E1159 Type 2 diabetes mellitus with other circulatory complications: Secondary | ICD-10-CM | POA: Diagnosis not present

## 2014-01-18 DIAGNOSIS — D649 Anemia, unspecified: Secondary | ICD-10-CM | POA: Diagnosis not present

## 2014-01-24 ENCOUNTER — Telehealth: Payer: Self-pay

## 2014-01-24 NOTE — Telephone Encounter (Signed)
Ok to do OTC saline lavage such as with a nettie pot as this can help flush

## 2014-01-24 NOTE — Telephone Encounter (Signed)
Called the patient informed of MD instructions. 

## 2014-01-24 NOTE — Telephone Encounter (Signed)
The patient has had a fruit fly (she states it is the kind of bug/fly you see around veges/fruit) fly up her nose.  She cannot get it out and has gotten very anxious over this.  She would like to know what to do and if her PCP could help.  Call back number is [phone].

## 2014-02-02 ENCOUNTER — Other Ambulatory Visit: Payer: Self-pay | Admitting: *Deleted

## 2014-02-02 MED ORDER — TOPIRAMATE 100 MG PO TABS
ORAL_TABLET | ORAL | Status: DC
Start: 2014-02-02 — End: 2014-07-11

## 2014-02-02 MED ORDER — CYCLOBENZAPRINE HCL 10 MG PO TABS: ORAL_TABLET | ORAL | Status: DC

## 2014-02-09 ENCOUNTER — Encounter: Payer: Self-pay | Admitting: Registered Nurse

## 2014-02-09 ENCOUNTER — Encounter: Payer: Medicare Other | Attending: Physical Medicine and Rehabilitation | Admitting: Registered Nurse

## 2014-02-09 VITALS — BP 122/64 | HR 67 | Resp 14 | Ht 70.0 in | Wt 279.0 lb

## 2014-02-09 DIAGNOSIS — M4716 Other spondylosis with myelopathy, lumbar region: Secondary | ICD-10-CM | POA: Diagnosis not present

## 2014-02-09 DIAGNOSIS — M1710 Unilateral primary osteoarthritis, unspecified knee: Secondary | ICD-10-CM | POA: Diagnosis present

## 2014-02-09 DIAGNOSIS — Z5181 Encounter for therapeutic drug level monitoring: Secondary | ICD-10-CM | POA: Diagnosis not present

## 2014-02-09 DIAGNOSIS — IMO0002 Reserved for concepts with insufficient information to code with codable children: Secondary | ICD-10-CM | POA: Diagnosis not present

## 2014-02-09 DIAGNOSIS — M069 Rheumatoid arthritis, unspecified: Secondary | ICD-10-CM

## 2014-02-09 DIAGNOSIS — G609 Hereditary and idiopathic neuropathy, unspecified: Secondary | ICD-10-CM

## 2014-02-09 DIAGNOSIS — M1712 Unilateral primary osteoarthritis, left knee: Secondary | ICD-10-CM

## 2014-02-09 DIAGNOSIS — S88119A Complete traumatic amputation at level between knee and ankle, unspecified lower leg, initial encounter: Secondary | ICD-10-CM | POA: Diagnosis not present

## 2014-02-09 DIAGNOSIS — Z79899 Other long term (current) drug therapy: Secondary | ICD-10-CM | POA: Diagnosis not present

## 2014-02-09 MED ORDER — OXYCODONE-ACETAMINOPHEN 10-325 MG PO TABS: 1.0000 | ORAL_TABLET | Freq: Four times a day (QID) | ORAL | Status: DC | PRN

## 2014-02-09 MED ORDER — FENTANYL 75 MCG/HR TD PT72: 75.0000 ug | MEDICATED_PATCH | TRANSDERMAL | Status: DC

## 2014-02-09 NOTE — Progress Notes (Signed)
Subjective:    Patient ID: Crystal Fitzpatrick, female    DOB: Jun 28, 1945, 68 y.o.   MRN: [id number]  HPI: [name] is a 68 year old female who returns for follow up for chronic pain and medication refill. She says her pain is located in her upper and lower back. She rates her pain 6. She will have trigger point injection next visit. Her current exercise regime is Yoga seven days a week and performing other stretching exercises. She arrived to clinic in wheelchair  Pain Inventory Average Pain 6 Pain Right Now 6 My pain is constant, sharp, tingling and aching  In the last 24 hours, has pain interfered with the following? General activity 1 Relation with others 3 Enjoyment of life 1 What TIME of day is your pain at its worst? morning, night Sleep (in general) Poor  Pain is worse with: sitting, inactivity and standing Pain improves with: therapy/exercise and medication Relief from Meds: 7  Mobility use a wheelchair  Function retired  Neuro/Psych No problems in this area  Prior Studies Any changes since last visit?  no  Physicians involved in your care Any changes since last visit?  no   Family History  Problem Relation Age of Onset  . Breast cancer Sister   . Diabetes Other   . Stroke Other     Grandparents   History   Social History  . Marital Status: Divorced    Spouse Name: N/A    Number of Children: 1  . Years of Education: N/A   Occupational History  . retired Marketing executive    Social History Main Topics  . Smoking status: Former Games developer  . Smokeless tobacco: Never Used  . Alcohol Use: No  . Drug Use: No  . Sexual Activity: None   Other Topics Concern  . None   Social History Narrative  . None   Past Surgical History  Procedure Laterality Date  . Amputation  02/02/2008    below right knee  . S/p egd  2007    with Botox for? Achalasia  . Tonsillectomy    . S/p breast biopsy  1992  . S/p bka  9/09    For DFU and  Osteomyelitis  . Tubal ligation    . Nasal septum surgery    . Foot surgury  1980 and 1981   Past Medical History  Diagnosis Date  . DIABETES MELLITUS, TYPE II 07/17/2008  . B12 DEFICIENCY 07/17/2008  . Acute gouty arthropathy 12/11/2008  . HYPONATREMIA 03/20/2010  . HYPERKALEMIA 10/31/2009  . ANEMIA-NOS 07/17/2008  . ANXIETY 07/17/2008  . Chronic pain syndrome 02/14/2010  . Trigeminal neuralgia 02/14/2010  . HEARING LOSS, RIGHT EAR 06/04/2009  . HYPERTENSION 07/17/2008  . CHF 01/28/2009  . Pneumonia, organism unspecified 02/14/2010  . GERD 07/17/2008  . IBS 06/04/2009  . UNSPECIFIED HEPATITIS 01/28/2009  . RENAL INSUFFICIENCY 10/31/2009  . MENOPAUSAL DISORDER 12/03/2009  . Cellulitis and abscess of leg, except foot 11/15/2008  . SKIN ULCER, CHRONIC 02/08/2009  . Rheumatoid arthritis(714.0) 07/17/2008  . SKIN LESION 06/04/2009  . ARTHRITIS 01/28/2009  . FOOT PAIN 07/17/2008  . HYPERSOMNIA 02/14/2010  . Altered mental status 02/07/2010  . PERIPHERAL EDEMA 08/30/2008  . Wheezing 12/03/2009  . ABDOMINAL DISTENSION 10/11/2008  . Dysuria 11/11/2009  . Hypoxemia 03/20/2010  . ANAPHYLACTIC SHOCK 05/15/2009  . NEUROPATHY, HX OF 01/28/2009  . COLONIC POLYPS, HX OF 07/17/2008  . AMPUTATION, BELOW KNEE, RIGHT, HX OF 01/28/2009  . Hyperlipemia 09/24/2010  .  Hyperlipidemia 09/24/2010  . Seizure 10/26/2010  . Pneumonia, aspiration 12/01/2011    Episode July 2013, tx per [facility]   BP 122/64  Pulse 67  Resp 14  Ht 5\' 10"  (1.778 m)  Wt 279 lb (126.554 kg)  BMI 40.03 kg/m2  SpO2 93%  Opioid Risk Score:   Fall Risk Score: Moderate Fall Risk (6-13 points)   Review of Systems     Objective:   Physical Exam  Nursing note and vitals reviewed. Constitutional: She is oriented to person, place, and time. She appears well-developed and well-nourished.  HENT:  Head: Normocephalic and atraumatic.  Neck: Normal range of motion. Neck supple.  Cardiovascular: Normal rate and regular rhythm.     Pulmonary/Chest: Effort normal and breath sounds normal.  Musculoskeletal:  Normal Muscle Bulk and Muscle Testing Reveals: Upper Extremities: Full ROM and Muscle Strength 5/5 Thoracic Paraspinal Tenderness: T-1- T-6 Lower Extremities: Right BKA Shrinker  Intact Left Lower Extremity Decreased ROM with Flexion Muscle Strength 5/5 Arrived in wheelchair    Neurological: She is alert and oriented to person, place, and time.  Skin: Skin is warm and dry.  Psychiatric: She has a normal mood and affect.          Assessment & Plan:  1.Right below-knee amputation, history of phantom limb pain.: No complaints at this time regarding phantom pain.Continue Current Medication Regime. Continue to Monitor.  2. Peripheral neuropathy: Continue Gabapentin.  Refilled: Fenatnyl Patch 75 mcg one patch every three days #10 and Oxycodone 10/325mg  one tablet every 6 hours as needed #120  3. History of rheumatoid arthritis. Continue Current Medication and Exercise and heat Therapy.  4. Osteoarthritis of left knee: Continue with Voltaren gel/ Exercise and heat therapy.  5. Chronic cellulitis, left leg with associated edema:  PMD Following   15 minutes of face to face patient care time was spent during this visit. All questions were encouraged and answered.   F/U in 1 month

## 2014-02-14 ENCOUNTER — Other Ambulatory Visit: Payer: Self-pay | Admitting: Internal Medicine

## 2014-02-14 NOTE — Telephone Encounter (Signed)
Patient of Dr [name] 8.20.15 last office visit

## 2014-02-14 NOTE — Telephone Encounter (Signed)
Done hardcopy to robin  

## 2014-02-14 NOTE — Telephone Encounter (Signed)
Dr Raphael Gibney pt

## 2014-02-14 NOTE — Telephone Encounter (Signed)
Faxed hardcopy for lomotil to [facility].

## 2014-03-07 ENCOUNTER — Encounter: Payer: Medicare Other | Admitting: Physical Medicine & Rehabilitation

## 2014-03-08 ENCOUNTER — Other Ambulatory Visit: Payer: Self-pay | Admitting: *Deleted

## 2014-03-08 ENCOUNTER — Telehealth: Payer: Self-pay | Admitting: Internal Medicine

## 2014-03-08 DIAGNOSIS — M1712 Unilateral primary osteoarthritis, left knee: Secondary | ICD-10-CM

## 2014-03-08 DIAGNOSIS — M069 Rheumatoid arthritis, unspecified: Secondary | ICD-10-CM

## 2014-03-08 DIAGNOSIS — Z89519 Acquired absence of unspecified leg below knee: Secondary | ICD-10-CM

## 2014-03-08 DIAGNOSIS — M4716 Other spondylosis with myelopathy, lumbar region: Secondary | ICD-10-CM

## 2014-03-08 DIAGNOSIS — G609 Hereditary and idiopathic neuropathy, unspecified: Secondary | ICD-10-CM

## 2014-03-08 MED ORDER — OXYCODONE-ACETAMINOPHEN 10-325 MG PO TABS: 1.0000 | ORAL_TABLET | Freq: Four times a day (QID) | ORAL | Status: DC | PRN

## 2014-03-08 MED ORDER — FENTANYL 75 MCG/HR TD PT72: 75.0000 ug | MEDICATED_PATCH | TRANSDERMAL | Status: DC

## 2014-03-08 NOTE — Telephone Encounter (Signed)
Narcotic rx printed for MD to sign for RN med refill visit 

## 2014-03-08 NOTE — Telephone Encounter (Signed)
Informed the patient all vaccines are up to date and the flu shot is the only one due.  She did schedule nurse visit appt. For tomorrow 03/09/14.

## 2014-03-08 NOTE — Telephone Encounter (Signed)
Patient would like to know do she need any other shot besides flu shot.

## 2014-03-09 ENCOUNTER — Encounter: Payer: Medicare Other | Admitting: Registered Nurse

## 2014-03-09 ENCOUNTER — Other Ambulatory Visit: Payer: Self-pay | Admitting: *Deleted

## 2014-03-09 ENCOUNTER — Encounter: Payer: Medicare Other | Attending: Physical Medicine and Rehabilitation | Admitting: *Deleted

## 2014-03-09 ENCOUNTER — Ambulatory Visit (INDEPENDENT_AMBULATORY_CARE_PROVIDER_SITE_OTHER): Payer: Medicare Other

## 2014-03-09 VITALS — BP 131/52 | HR 76 | Resp 14

## 2014-03-09 DIAGNOSIS — Z23 Encounter for immunization: Secondary | ICD-10-CM

## 2014-03-09 DIAGNOSIS — Z760 Encounter for issue of repeat prescription: Secondary | ICD-10-CM | POA: Insufficient documentation

## 2014-03-09 DIAGNOSIS — Z89519 Acquired absence of unspecified leg below knee: Secondary | ICD-10-CM

## 2014-03-09 DIAGNOSIS — M069 Rheumatoid arthritis, unspecified: Secondary | ICD-10-CM

## 2014-03-09 DIAGNOSIS — M1712 Unilateral primary osteoarthritis, left knee: Secondary | ICD-10-CM

## 2014-03-09 DIAGNOSIS — G894 Chronic pain syndrome: Secondary | ICD-10-CM

## 2014-03-09 DIAGNOSIS — M4716 Other spondylosis with myelopathy, lumbar region: Secondary | ICD-10-CM

## 2014-03-09 DIAGNOSIS — G609 Hereditary and idiopathic neuropathy, unspecified: Secondary | ICD-10-CM

## 2014-03-09 DIAGNOSIS — Z5181 Encounter for therapeutic drug level monitoring: Secondary | ICD-10-CM

## 2014-03-09 MED ORDER — FENTANYL 75 MCG/HR TD PT72: 75.0000 ug | MEDICATED_PATCH | TRANSDERMAL | Status: DC

## 2014-03-09 MED ORDER — OXYCODONE-ACETAMINOPHEN 10-325 MG PO TABS: 1.0000 | ORAL_TABLET | Freq: Four times a day (QID) | ORAL | Status: DC | PRN

## 2014-03-09 NOTE — Progress Notes (Signed)
Here for pill count and medication refills.Pill and patch counts are appropriate.  VSS    Pain level: 6  She has had no falls and has been educated and given handout previously.  Refills given and will return in one month for visit with Riley Lam NP

## 2014-03-09 NOTE — Telephone Encounter (Signed)
Narcotic rx printed for MD to sign for RN med refill visit.  I printed rx 03/08/14 for Dr Wynn Banker to sign and did not get it signed before he left so I am reprinting to day for [name] to sign.

## 2014-03-27 ENCOUNTER — Other Ambulatory Visit: Payer: Self-pay

## 2014-03-27 MED ORDER — PANTOPRAZOLE SODIUM 40 MG PO TBEC: DELAYED_RELEASE_TABLET | ORAL | Status: DC

## 2014-03-28 ENCOUNTER — Ambulatory Visit: Payer: Medicare Other | Admitting: Internal Medicine

## 2014-04-04 ENCOUNTER — Encounter: Payer: Medicare Other | Attending: Physical Medicine and Rehabilitation | Admitting: Physical Medicine & Rehabilitation

## 2014-04-04 ENCOUNTER — Telehealth: Payer: Self-pay | Admitting: *Deleted

## 2014-04-04 ENCOUNTER — Encounter: Payer: Self-pay | Admitting: Physical Medicine & Rehabilitation

## 2014-04-04 VITALS — BP 137/63 | HR 82 | Resp 14

## 2014-04-04 DIAGNOSIS — G894 Chronic pain syndrome: Secondary | ICD-10-CM | POA: Diagnosis not present

## 2014-04-04 DIAGNOSIS — Z89511 Acquired absence of right leg below knee: Secondary | ICD-10-CM

## 2014-04-04 DIAGNOSIS — Z89519 Acquired absence of unspecified leg below knee: Secondary | ICD-10-CM | POA: Insufficient documentation

## 2014-04-04 DIAGNOSIS — M2550 Pain in unspecified joint: Secondary | ICD-10-CM

## 2014-04-04 DIAGNOSIS — G609 Hereditary and idiopathic neuropathy, unspecified: Secondary | ICD-10-CM | POA: Insufficient documentation

## 2014-04-04 DIAGNOSIS — M069 Rheumatoid arthritis, unspecified: Secondary | ICD-10-CM | POA: Diagnosis not present

## 2014-04-04 DIAGNOSIS — M1712 Unilateral primary osteoarthritis, left knee: Secondary | ICD-10-CM

## 2014-04-04 DIAGNOSIS — G546 Phantom limb syndrome with pain: Secondary | ICD-10-CM

## 2014-04-04 DIAGNOSIS — B0229 Other postherpetic nervous system involvement: Secondary | ICD-10-CM | POA: Insufficient documentation

## 2014-04-04 DIAGNOSIS — M179 Osteoarthritis of knee, unspecified: Secondary | ICD-10-CM

## 2014-04-04 DIAGNOSIS — M4716 Other spondylosis with myelopathy, lumbar region: Secondary | ICD-10-CM | POA: Insufficient documentation

## 2014-04-04 MED ORDER — OXYCODONE-ACETAMINOPHEN 10-325 MG PO TABS: 1.0000 | ORAL_TABLET | Freq: Four times a day (QID) | ORAL | Status: DC | PRN

## 2014-04-04 MED ORDER — LIDOCAINE 5 % EX PTCH
1.0000 | MEDICATED_PATCH | Freq: Every day | CUTANEOUS | Status: DC
Start: 2014-04-04 — End: 2014-04-04

## 2014-04-04 MED ORDER — FENTANYL 75 MCG/HR TD PT72: 75.0000 ug | MEDICATED_PATCH | TRANSDERMAL | Status: DC

## 2014-04-04 MED ORDER — LIDOCAINE 5 % EX PTCH: 1.0000 | MEDICATED_PATCH | Freq: Every day | CUTANEOUS | Status: DC

## 2014-04-04 NOTE — Telephone Encounter (Signed)
Left message on Marilyns personal identified voicemail to call us back to verify what insurance is coveing her medications.  We have medicare and a part F [employer] but no record of part D.  Pharmacy has said it is [facility] but we need to have a pt ID # to fill out the prior auth for her lidocaine patches.  She has had shingles on her face--documented.  I have changed the order for Lidoderm patches to electronic instead of the printed rx .

## 2014-04-04 NOTE — Patient Instructions (Signed)
PLEASE CALL ME WITH ANY PROBLEMS OR QUESTIONS (#[phone]).     YOU NEED TO WORK ON OTHER MEANS TO HELP MANAGE YOUR PAIN. THE ANSWER FOR YOUR PAIN IS NOT IN A PRESCRIPTION.   I WANT YOU TO TRY DECREASING YOUR PERCOCET THIS MONTH IF IT'S NOT HELPING YOU----THINK ABOUT IT, ---IF IT'S NOT HELPING THEN WHY SHOULD YOU TAKE IT?

## 2014-04-04 NOTE — Progress Notes (Signed)
Subjective:    Patient ID: Crystal Fitzpatrick, female    DOB: 1945-11-14, 68 y.o.   MRN: [id number]  HPI   Talma is back regarding her chronic pain. She likes where she is living currently and has taken to a cat. She has also reunited with her son and that bond has given her happiness.   Laya complains of more generalized pain (feels like she has the flu) in her arms and legs. Her right leg itself feels pretty good. She feels that the fentanyl helps but the percocet doesn't seem to have an effect anymore.  Her left face/necl and upper back remain tender/sensitive to touch. She misses her lidoderm patches which were very helpful for this pain.    Pain Inventory Average Pain 8 Pain Right Now 6 My pain is constant  In the last 24 hours, has pain interfered with the following? General activity 6 Relation with others 2 Enjoyment of life 1 What TIME of day is your pain at its worst? morning, night Sleep (in general) Fair  Pain is worse with: inactivity Pain improves with: rest, therapy/exercise and medication Relief from Meds: 5  Mobility Do you have any goals in this area?  yes  Function disabled: date disabled . Do you have any goals in this area?  yes  Neuro/Psych spasms  Prior Studies Any changes since last visit?  no  Physicians involved in your care Any changes since last visit?  no   Family History  Problem Relation Age of Onset  . Breast cancer Sister   . Diabetes Other   . Stroke Other     Grandparents   History   Social History  . Marital Status: Divorced    Spouse Name: N/A    Number of Children: 1  . Years of Education: N/A   Occupational History  . retired Marketing executive    Social History Main Topics  . Smoking status: Former Games developer  . Smokeless tobacco: Never Used  . Alcohol Use: No  . Drug Use: No  . Sexual Activity: None   Other Topics Concern  . None   Social History Narrative   Past Surgical History  Procedure  Laterality Date  . Amputation  02/02/2008    below right knee  . S/p egd  2007    with Botox for? Achalasia  . Tonsillectomy    . S/p breast biopsy  1992  . 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 Tubal ligation    . Nasal septum surgery    . Foot surgury  1980 and 1981   Past Medical History  Diagnosis Date  . DIABETES MELLITUS, TYPE II 07/17/2008  . B12 DEFICIENCY 07/17/2008  . Acute gouty arthropathy 12/11/2008  . HYPONATREMIA 03/20/2010  . HYPERKALEMIA 10/31/2009  . ANEMIA-NOS 07/17/2008  . ANXIETY 07/17/2008  . Chronic pain syndrome 02/14/2010  . Trigeminal neuralgia 02/14/2010  . HEARING LOSS, RIGHT EAR 06/04/2009  . HYPERTENSION 07/17/2008  . CHF 01/28/2009  . Pneumonia, organism unspecified 02/14/2010  . GERD 07/17/2008  . IBS 06/04/2009  . UNSPECIFIED HEPATITIS 01/28/2009  . RENAL INSUFFICIENCY 10/31/2009  . MENOPAUSAL DISORDER 12/03/2009  . Cellulitis and abscess of leg, except foot 11/15/2008  . SKIN ULCER, CHRONIC 02/08/2009  . Rheumatoid arthritis(714.0) 07/17/2008  . SKIN LESION 06/04/2009  . ARTHRITIS 01/28/2009  . FOOT PAIN 07/17/2008  . HYPERSOMNIA 02/14/2010  . Altered mental status 02/07/2010  . PERIPHERAL EDEMA 08/30/2008  . Wheezing 12/03/2009  .  ABDOMINAL DISTENSION 10/11/2008  . Dysuria 11/11/2009  . Hypoxemia 03/20/2010  . ANAPHYLACTIC SHOCK 05/15/2009  . NEUROPATHY, HX OF 01/28/2009  . COLONIC POLYPS, HX OF 07/17/2008  . AMPUTATION, BELOW KNEE, RIGHT, HX OF 01/28/2009  . Hyperlipemia 09/24/2010  . Hyperlipidemia 09/24/2010  . Seizure 10/26/2010  . Pneumonia, aspiration 12/01/2011    Episode July 2013, tx per [facility]   BP 137/63 mmHg  Pulse 82  Resp 14  SpO2 96%  Opioid Risk Score:   Fall Risk Score: Moderate Fall Risk (6-13 points) Review of Systems  Neurological:       Spasms  All other systems reviewed and are negative.      Objective:   Physical Exam  Constitutional: She appears well-developed and well-nourished. She has lost weight  HENT:  some facial tenderness and hypertensitivity Head: Normocephalic and atraumatic.  Nose: Nose normal.  Mouth/Throat: Oropharynx is clear and moist.  Eyes: Conjunctivae are normal.  Neck: Normal range of motion. Neck supple.  Cardiovascular: Normal rate and regular rhythm.   Pulmonary/Chest: No respiratory distress. She has no wheezes.  Musculoskeletal:  Edema 1+ RLE, 2++LLE. Chronic stasis changes are noted on the left leg. Weight is improved. Left knee is red/indurated-no bruising or blisters. right leg is non-tender to touch, minimal swelling. No bruising is seen.   Neurological: A sensory deficit (left foot), right leg to PP and LT Psychiatric: She has a normal mood and affect. Her behavior is normal. Judgment and thought content normal.    Assessment & Plan:   ASSESSMENT:  1. Right below-knee amputation, history of phantom limb pain.  2. Peripheral neuropathy.  3. History of rheumatoid arthritis.  4. Osteoarthritis of left knee-  5. Chronic cellulitis, left leg with associated edema.  6. Left patella fx (per pt) after fall at home 2 weeks ago  7. 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 Will try to seek authorization for lidoderm patches for her face/neck. #30   2. Discussed pain mgt with Jola Babinski at length and how she needs to use other modalities and means to improve her pain control.   3. Maintain gabapentin at TID with QID for flare, watching closely for edema.  4. Refilled her pain medications today including Percocet 10/325, #120  and fentanyl 75 mcg #10. I asked her to work on decreasing her percocet intake 6.She will follow up with my NP in one month. 30 minutes of face to face patient care time were spent during this visit. All questions were encouraged and answered. We discussed safety options at home today, including a fall guard for bed.

## 2014-04-11 NOTE — Telephone Encounter (Signed)
Crystal Fitzpatrick has not called back and will discuss at next visit.

## 2014-04-26 ENCOUNTER — Ambulatory Visit: Payer: Medicare Other | Admitting: Internal Medicine

## 2014-04-30 ENCOUNTER — Other Ambulatory Visit: Payer: Self-pay | Admitting: Internal Medicine

## 2014-05-02 ENCOUNTER — Ambulatory Visit: Payer: Medicare Other | Admitting: Internal Medicine

## 2014-05-03 ENCOUNTER — Ambulatory Visit: Payer: Medicare Other | Admitting: Internal Medicine

## 2014-05-03 ENCOUNTER — Encounter: Payer: Medicare Other | Admitting: Registered Nurse

## 2014-05-04 ENCOUNTER — Other Ambulatory Visit: Payer: Self-pay | Admitting: *Deleted

## 2014-05-04 DIAGNOSIS — Z89519 Acquired absence of unspecified leg below knee: Secondary | ICD-10-CM

## 2014-05-04 DIAGNOSIS — M4716 Other spondylosis with myelopathy, lumbar region: Secondary | ICD-10-CM

## 2014-05-04 DIAGNOSIS — M1712 Unilateral primary osteoarthritis, left knee: Secondary | ICD-10-CM

## 2014-05-04 DIAGNOSIS — M069 Rheumatoid arthritis, unspecified: Secondary | ICD-10-CM

## 2014-05-04 DIAGNOSIS — G609 Hereditary and idiopathic neuropathy, unspecified: Secondary | ICD-10-CM

## 2014-05-04 MED ORDER — FENTANYL 75 MCG/HR TD PT72: 75.0000 ug | MEDICATED_PATCH | TRANSDERMAL | Status: DC

## 2014-05-04 MED ORDER — OXYCODONE-ACETAMINOPHEN 10-325 MG PO TABS
1.0000 | ORAL_TABLET | Freq: Four times a day (QID) | ORAL | Status: DC | PRN
Start: 2014-05-04 — End: 2014-06-06

## 2014-05-04 MED ORDER — DULOXETINE HCL 60 MG PO CPEP: ORAL_CAPSULE | ORAL | Status: DC

## 2014-05-04 NOTE — Telephone Encounter (Signed)
rx printed for fentanyl and percocet for MD to sign for RN med refill 05/07/14

## 2014-05-04 NOTE — Telephone Encounter (Signed)
Fax rec'd, Rx sent electronically, pt called, left message on voice mail

## 2014-05-07 ENCOUNTER — Other Ambulatory Visit: Payer: Self-pay | Admitting: Physical Medicine & Rehabilitation

## 2014-05-07 ENCOUNTER — Encounter: Payer: Medicare Other | Attending: Physical Medicine and Rehabilitation | Admitting: *Deleted

## 2014-05-07 ENCOUNTER — Encounter: Payer: Self-pay | Admitting: *Deleted

## 2014-05-07 DIAGNOSIS — Z89519 Acquired absence of unspecified leg below knee: Secondary | ICD-10-CM | POA: Insufficient documentation

## 2014-05-07 DIAGNOSIS — Z5181 Encounter for therapeutic drug level monitoring: Secondary | ICD-10-CM

## 2014-05-07 DIAGNOSIS — G894 Chronic pain syndrome: Secondary | ICD-10-CM

## 2014-05-07 DIAGNOSIS — M179 Osteoarthritis of knee, unspecified: Secondary | ICD-10-CM | POA: Insufficient documentation

## 2014-05-07 DIAGNOSIS — Z79899 Other long term (current) drug therapy: Secondary | ICD-10-CM

## 2014-05-07 DIAGNOSIS — G609 Hereditary and idiopathic neuropathy, unspecified: Secondary | ICD-10-CM | POA: Insufficient documentation

## 2014-05-07 DIAGNOSIS — M069 Rheumatoid arthritis, unspecified: Secondary | ICD-10-CM | POA: Insufficient documentation

## 2014-05-07 DIAGNOSIS — M4716 Other spondylosis with myelopathy, lumbar region: Secondary | ICD-10-CM | POA: Insufficient documentation

## 2014-05-07 NOTE — Addendum Note (Signed)
Addended by: Doreene Eland on: 05/07/2014 12:16 PM   Modules accepted: Orders, Medications, Level of Service

## 2014-05-07 NOTE — Progress Notes (Signed)
Crystal Fitzpatrick is in for refill on her fentanyl and percocet.  She cancelled her last appt because she though she was getting sick because she could not get warm.  What happened was she found out later that her heat was out and they are having to replace the unit.  She has had not falls and has not travelled out of the country in the last 21 days.  Her pillcount and patch count was appropriate.  Refills given.  She will follow up with Riley Lam in one month.

## 2014-05-16 ENCOUNTER — Other Ambulatory Visit: Payer: Self-pay | Admitting: Internal Medicine

## 2014-05-17 NOTE — Telephone Encounter (Signed)
Faxed hardcopy for Lomotil to [facility].

## 2014-05-23 NOTE — Progress Notes (Signed)
Urine drug screen for this encounter is consistent for prescribed medications.   

## 2014-06-05 ENCOUNTER — Encounter: Payer: Medicare Other | Admitting: Registered Nurse

## 2014-06-05 ENCOUNTER — Ambulatory Visit: Payer: Medicare Other | Admitting: Family

## 2014-06-06 ENCOUNTER — Encounter: Payer: Medicare Other | Attending: Physical Medicine and Rehabilitation | Admitting: Registered Nurse

## 2014-06-06 ENCOUNTER — Ambulatory Visit (INDEPENDENT_AMBULATORY_CARE_PROVIDER_SITE_OTHER): Payer: Medicare Other | Admitting: Family

## 2014-06-06 ENCOUNTER — Encounter: Payer: Self-pay | Admitting: Family

## 2014-06-06 ENCOUNTER — Encounter: Payer: Self-pay | Admitting: Registered Nurse

## 2014-06-06 VITALS — BP 128/61 | HR 84 | Resp 14

## 2014-06-06 VITALS — BP 160/72 | HR 93 | Temp 98.6°F | Resp 18

## 2014-06-06 DIAGNOSIS — G609 Hereditary and idiopathic neuropathy, unspecified: Secondary | ICD-10-CM

## 2014-06-06 DIAGNOSIS — M1712 Unilateral primary osteoarthritis, left knee: Secondary | ICD-10-CM

## 2014-06-06 DIAGNOSIS — M2550 Pain in unspecified joint: Secondary | ICD-10-CM | POA: Diagnosis not present

## 2014-06-06 DIAGNOSIS — Z89511 Acquired absence of right leg below knee: Secondary | ICD-10-CM | POA: Diagnosis not present

## 2014-06-06 DIAGNOSIS — G894 Chronic pain syndrome: Secondary | ICD-10-CM | POA: Diagnosis not present

## 2014-06-06 DIAGNOSIS — M179 Osteoarthritis of knee, unspecified: Secondary | ICD-10-CM | POA: Diagnosis not present

## 2014-06-06 DIAGNOSIS — L03116 Cellulitis of left lower limb: Secondary | ICD-10-CM | POA: Diagnosis not present

## 2014-06-06 DIAGNOSIS — M069 Rheumatoid arthritis, unspecified: Secondary | ICD-10-CM | POA: Diagnosis not present

## 2014-06-06 DIAGNOSIS — M4716 Other spondylosis with myelopathy, lumbar region: Secondary | ICD-10-CM | POA: Diagnosis not present

## 2014-06-06 DIAGNOSIS — Z79899 Other long term (current) drug therapy: Secondary | ICD-10-CM | POA: Diagnosis not present

## 2014-06-06 DIAGNOSIS — Z5181 Encounter for therapeutic drug level monitoring: Secondary | ICD-10-CM

## 2014-06-06 DIAGNOSIS — Z89519 Acquired absence of unspecified leg below knee: Secondary | ICD-10-CM | POA: Diagnosis not present

## 2014-06-06 MED ORDER — FENTANYL 75 MCG/HR TD PT72: 75.0000 ug | MEDICATED_PATCH | TRANSDERMAL | Status: DC

## 2014-06-06 MED ORDER — CEPHALEXIN 500 MG PO CAPS
500.0000 mg | ORAL_CAPSULE | Freq: Four times a day (QID) | ORAL | Status: DC
Start: 2014-06-06 — End: 2014-06-21

## 2014-06-06 MED ORDER — OXYCODONE-ACETAMINOPHEN 10-325 MG PO TABS: 1.0000 | ORAL_TABLET | Freq: Four times a day (QID) | ORAL | Status: DC | PRN

## 2014-06-06 NOTE — Assessment & Plan Note (Signed)
Symptoms and exam consistent with cellulitis. Start Keflex 7 days. Patient instructed to follow-up if symptoms do not improve in the next 72 hours for potential MRSA coverage.

## 2014-06-06 NOTE — Progress Notes (Signed)
Pre visit review using our clinic review tool, if applicable. No additional management support is needed unless otherwise documented below in the visit note. 

## 2014-06-06 NOTE — Patient Instructions (Addendum)
[facility] [address]  [place], [place] [phone] (Phone)   Thank you for choosing [facility].  Summary/Instructions:  Your prescription(s) have been submitted to your pharmacy or been printed and provided for you. Please take as directed and contact our office if you believe you are having problem(s) with the medication(s) or have any questions.  If your symptoms worsen or fail to improve, please contact our office for further instruction, or in case of emergency go directly to the emergency room at the closest medical facility.   Cellulitis Cellulitis is an infection of the skin and the tissue beneath it. The infected area is usually red and tender. Cellulitis occurs most often in the arms and lower legs.  CAUSES  Cellulitis is caused by bacteria that enter the skin through cracks or cuts in the skin. The most common types of bacteria that cause cellulitis are staphylococci and streptococci. SIGNS AND SYMPTOMS   Redness and warmth.  Swelling.  Tenderness or pain.  Fever. DIAGNOSIS  Your health care provider can usually determine what is wrong based on a physical exam. Blood tests may also be done. TREATMENT  Treatment usually involves taking an antibiotic medicine. HOME CARE INSTRUCTIONS   Take your antibiotic medicine as directed by your health care provider. Finish the antibiotic even if you start to feel better.  Keep the infected arm or leg elevated to reduce swelling.  Apply a warm cloth to the affected area up to 4 times per day to relieve pain.  Take medicines only as directed by your health care provider.  Keep all follow-up visits as directed by your health care provider. SEEK MEDICAL CARE IF:   You notice red streaks coming from the infected area.  Your red area gets larger or turns dark in color.  Your bone or joint underneath the infected area becomes painful after the skin has healed.  Your infection returns in the same area or another  area.  You notice a swollen bump in the infected area.  You develop new symptoms.  You have a fever. SEEK IMMEDIATE MEDICAL CARE IF:   You feel very sleepy.  You develop vomiting or diarrhea.  You have a general ill feeling (malaise) with muscle aches and pains. MAKE SURE YOU:   Understand these instructions.  Will watch your condition.  Will get help right away if you are not doing well or get worse. Document Released: 02/11/2005 Document Revised: 09/18/2013 Document Reviewed: 07/20/2011 [facility] Patient Information 2015 [place], [place]. This information is not intended to replace advice given to you by your health care provider. Make sure you discuss any questions you have with your health care provider.

## 2014-06-06 NOTE — Progress Notes (Signed)
Subjective:    Patient ID: Crystal Fitzpatrick, female    DOB: [birth year], 69 y.o.   MRN: [id number]  HPI: Crystal Fitzpatrick is a 69 year old female who returns for follow up for chronic pain and medication refill. She says her pain is located in her neck, right shoulder, lower back and left leg. She rates her pain 6. Her current exercise regime is Yoga seven days a week and performing stretching exercises. She arrived to clinic in wheelchair She states" she slipped out of her wheelchair earlier this month, landed on the floor. Her friends helped her up. Educated on falls prevention she verbalizes understanding. She has cellulitis noted left lower extremity she has an appointment with her PCP today at 2:30.   Pain Inventory Average Pain 4 Pain Right Now 6 My pain is intermittent and constant  In the last 24 hours, has pain interfered with the following? General activity 4 Relation with others 3 Enjoyment of life 4 What TIME of day is your pain at its worst? morning,evening, night Sleep (in general) Poor  Pain is worse with: inactivity Pain improves with: rest, therapy/exercise and medication Relief from Meds: 5  Mobility use a wheelchair  Function disabled: date disabled .  Neuro/Psych No problems in this area  Prior Studies Any changes since last visit?  no  Physicians involved in your care Any changes since last visit?  no   Family History  Problem Relation Age of Onset  . Breast cancer Sister   . Diabetes Other   . Stroke Other     Grandparents   History   Social History  . Marital Status: Divorced    Spouse Name: N/A    Number of Children: 1  . Years of Education: N/A   Occupational History  . retired Marketing executive    Social History Main Topics  . Smoking status: Former Games developer  . Smokeless tobacco: Never Used  . Alcohol Use: No  . Drug Use: No  . Sexual Activity: None   Other Topics Concern  . None   Social History Narrative    Past Surgical History  Procedure Laterality Date  . Amputation  02/02/2008    below right knee  . S/p egd  2007    with Botox for? Achalasia  . Tonsillectomy    . S/p breast biopsy  1992  . 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 Tubal ligation    . Nasal septum surgery    . Foot surgury  1980 and 1981   Past Medical History  Diagnosis Date  . DIABETES MELLITUS, TYPE II 07/17/2008  . B12 DEFICIENCY 07/17/2008  . Acute gouty arthropathy 12/11/2008  . HYPONATREMIA 03/20/2010  . HYPERKALEMIA 10/31/2009  . ANEMIA-NOS 07/17/2008  . ANXIETY 07/17/2008  . Chronic pain syndrome 02/14/2010  . Trigeminal neuralgia 02/14/2010  . HEARING LOSS, RIGHT EAR 06/04/2009  . HYPERTENSION 07/17/2008  . CHF 01/28/2009  . Pneumonia, organism unspecified 02/14/2010  . GERD 07/17/2008  . IBS 06/04/2009  . UNSPECIFIED HEPATITIS 01/28/2009  . RENAL INSUFFICIENCY 10/31/2009  . MENOPAUSAL DISORDER 12/03/2009  . Cellulitis and abscess of leg, except foot 11/15/2008  . SKIN ULCER, CHRONIC 02/08/2009  . Rheumatoid arthritis(714.0) 07/17/2008  . SKIN LESION 06/04/2009  . ARTHRITIS 01/28/2009  . FOOT PAIN 07/17/2008  . HYPERSOMNIA 02/14/2010  . Altered mental status 02/07/2010  . PERIPHERAL EDEMA 08/30/2008  . Wheezing 12/03/2009  . ABDOMINAL DISTENSION 10/11/2008  . Dysuria 11/11/2009  .  Hypoxemia 03/20/2010  . ANAPHYLACTIC SHOCK 05/15/2009  . NEUROPATHY, HX OF 01/28/2009  . COLONIC POLYPS, HX OF 07/17/2008  . AMPUTATION, BELOW KNEE, RIGHT, HX OF 01/28/2009  . Hyperlipemia 09/24/2010  . Hyperlipidemia 09/24/2010  . Seizure 10/26/2010  . Pneumonia, aspiration 12/01/2011    Episode July 2013, tx per [facility]   BP 128/61 mmHg  Pulse 84  Resp 14  SpO2 93%  Opioid Risk Score:   Fall Risk Score: Moderate Fall Risk (6-13 points) (pt given pamphlet during to days visit) Review of Systems  All other systems reviewed and are negative.      Objective:   Physical Exam  Constitutional: She is oriented to person,  place, and time. She appears well-developed and well-nourished.  HENT:  Head: Normocephalic and atraumatic.  Neck: Normal range of motion. Neck supple.  Cervical Paraspinal Tenderness: C-6-7  Cardiovascular: Normal rate and regular rhythm.   Pulmonary/Chest: Effort normal and breath sounds normal.  Musculoskeletal:  Normal Muscle Bulk and Muscle testing Reveals: Upper Extremities: Full ROM and Muscle strength 5/5 Right Spine of Scapula Tenderness Thoracic Paraspinal tenderness: T-3- T- 7 Lumbar Paraspinal Tenderness: L-3- L-5 Lower Extremities: Right BKA Left Full ROM and Muscle Strength 5/5 Left Leg Flexion Produces pain into Patella  Arrived in wheelchair   Neurological: She is alert and oriented to person, place, and time.  Skin: Skin is warm and dry.  Cellulitis Left Lower extremity.  Psychiatric: She has a normal mood and affect.  Nursing note and vitals reviewed.         Assessment & Plan:  1.Right below-knee amputation, history of phantom limb pain.: No complaints at this time regarding phantom pain.Continue Current Medication Regime. Continue to Monitor.  2. Peripheral neuropathy: Continue Gabapentin.  Refilled: Fenatnyl Patch 75 mcg one patch every three days #10 and Oxycodone 10/325mg  one tablet every 6 hours as needed #120  3. History of rheumatoid arthritis. Continue Current Medication and Exercise and heat Therapy.  4. Osteoarthritis of left knee: Continue with Voltaren gel/ Exercise and heat therapy.  5. Chronic cellulitis, left leg with associated edema: PMD Following   15 minutes of face to face patient care time was spent during this visit. All questions were encouraged and answered.   F/U in 1 month

## 2014-06-06 NOTE — Progress Notes (Signed)
 Subjective:    Patient ID: Crystal Fitzpatrick, female    DOB: 06/23/1945, 68 y.o.   MRN: [id number]  Chief Complaint  Patient presents with  . Leg Swelling    has cellulitis, left leg is swollen, x10 days, pain and redness    HPI:  Crystal Fitzpatrick is a 68 y.o. female who presents today for an acute visit.   Acute associated symptoms of left leg swelling, redness and pain started approximately 10 days ago. Indicates that she has been a lot more active which seems to set things off.   Allergies  Allergen Reactions  . Morphine Anaphylaxis  . Sulfonamide Derivatives Diarrhea  . Ciprofloxacin Diarrhea  . Clindamycin/Lincomycin Diarrhea  . Doxycycline     Nausea, diarrhea  . Lyrica [Pregabalin]   . Nitrofurantoin     REACTION: itch  . Prednisone     Fast heart rate  . Erythromycin Rash    Current Outpatient Prescriptions on File Prior to Visit  Medication Sig Dispense Refill  . albuterol (PROAIR HFA) 108 (90 BASE) MCG/ACT inhaler Inhale 2 puffs into the lungs every 6 (six) hours as needed.      . ALPRAZolam (XANAX) 0.25 MG tablet TAKE ONE TABLET 3 TIMES A DAY AS NEEDED 90 tablet 2  . Ascorbic Acid (VITAMIN C) 500 MG tablet Take 500 mg by mouth daily.      . BENICAR 40 MG tablet TAKE 1 TABLET BY MOUTH EVERY DAY FOR BLOOD PRESSURE 30 tablet 5  . Blood Glucose Monitoring Suppl (ONE TOUCH ULTRA 2) W/DEVICE KIT 1 Device by Does not apply route once. 1 each 0  . Blood Glucose Monitoring Suppl (ONE TOUCH ULTRA SYSTEM KIT) W/DEVICE KIT 1 kit by Does not apply route once. 1 each 0  . cyclobenzaprine (FLEXERIL) 10 MG tablet TAKE 1 TABLET BY MOUTH EVERY 8 HOURS AS NEEDED 1 month supply 90 tablet 3  . diclofenac sodium (VOLTAREN) 1 % GEL Apply 2 g topically 4 (four) times daily. 3 Tube 3  . diphenoxylate-atropine (LOMOTIL) 2.5-0.025 MG per tablet TAKE 1 TABLET BY MOUTH 2 TIMES A DAY AS NEEDED 60 tablet 1  . DULoxetine (CYMBALTA) 60 MG capsule TAKE 2 CAPSULES BY MOUTH DAILY 60 capsule 3  .  EPINEPHrine (EPIPEN 2-PAK) 0.3 mg/0.3 mL DEVI Inject 0.3 mLs (0.3 mg total) into the muscle once. Use as directed 1 Device 0  . EPINEPHrine (EPIPEN) 0.3 mg/0.3 mL IJ SOAJ injection Inject 0.3 mLs (0.3 mg total) into the muscle once. 1 Device 2  . fentaNYL (DURAGESIC - DOSED MCG/HR) 75 MCG/HR Place 1 patch (75 mcg total) onto the skin every 3 (three) days. 1 patch every 3 days 10 patch 0  . furosemide (LASIX) 40 MG tablet Take 1 tablet (40 mg total) by mouth daily. 90 tablet 3  . gabapentin (NEURONTIN) 600 MG tablet Take 1 tablet (600 mg total) by mouth 3 (three) times daily. 90 tablet 3  . lidocaine (LIDODERM) 5 % Place 1 patch onto the skin daily. Remove & Discard patch within 12 hours or as directed by MD 30 patch 3  . meclizine (ANTIVERT) 25 MG tablet Take 1 tablet (25 mg total) by mouth 3 (three) times daily as needed. 30 tablet 0  . Multiple Vitamins-Minerals (CENTRUM SILVER ULTRA WOMENS PO) Take by mouth daily.      . omega-3 acid ethyl esters (LOVAZA) 1 G capsule Take 1 capsule (1 g total) by mouth daily. 90 capsule 3  . Omega-3 Fatty Acids (FISH   OIL) 1000 MG CAPS Take by mouth daily.      . ONETOUCH DELICA LANCETS MISC Use as directed to test blood sugar dx 250.00 100 each 2  . oxyCODONE-acetaminophen (PERCOCET) 10-325 MG per tablet Take 1 tablet by mouth every 6 (six) hours as needed for pain. 120 tablet 0  . pantoprazole (PROTONIX) 40 MG tablet TAKE ONE (1) TABLET BY MOUTH EVERY DAY  FOR ACID REFLUX 30 tablet 11  . promethazine (PHENERGAN) 25 MG tablet TAKE 1 TABLET BY MOUTH EVERY 6 HOURS AS NEEDED FOR NAUSEA 60 tablet 1  . promethazine (PHENERGAN) 25 MG tablet TAKE ONE (1) TABLET EVERY 6 HOURS AS NEEDED FOR NAUSEA 60 tablet 1  . Psyllium (METAMUCIL) 30.9 % POWD Take by mouth. Take as directed     . topiramate (TOPAMAX) 100 MG tablet TAKE 3 TABLETS BY MOUTH EACH NIGHT AT BEDTIME 90 tablet 3   No current facility-administered medications on file prior to visit.    Review of Systems    Constitutional: Negative for fever and chills.  Cardiovascular: Positive for leg swelling.  Skin: Positive for rash.      Objective:    BP 160/72 mmHg  Pulse 93  Temp(Src) 98.6 F (37 C) (Oral)  Resp 18  SpO2 94% Nursing note and vital signs reviewed.  Physical Exam  Constitutional: She is oriented to person, place, and time. She appears well-developed and well-nourished. No distress.  Female seated in a wheelchair with right leg amputation. Dressed appropriately and appears her stated age.   Cardiovascular: Normal rate, regular rhythm, normal heart sounds and intact distal pulses.   Pulmonary/Chest: Effort normal and breath sounds normal.  Neurological: She is alert and oriented to person, place, and time.  Skin: Skin is warm and dry.  Edema, redness and mild tenderness of anterior lower left leg. No discharge noted. Skin is blanchable.   Psychiatric: She has a normal mood and affect. Her behavior is normal. Judgment and thought content normal.       Assessment & Plan:    

## 2014-06-16 ENCOUNTER — Telehealth: Payer: Self-pay | Admitting: Family

## 2014-06-19 DIAGNOSIS — E1149 Type 2 diabetes mellitus with other diabetic neurological complication: Secondary | ICD-10-CM | POA: Diagnosis not present

## 2014-06-19 DIAGNOSIS — M722 Plantar fascial fibromatosis: Secondary | ICD-10-CM | POA: Diagnosis not present

## 2014-06-19 DIAGNOSIS — M2040 Other hammer toe(s) (acquired), unspecified foot: Secondary | ICD-10-CM | POA: Diagnosis not present

## 2014-06-20 NOTE — Telephone Encounter (Signed)
PLEASE NOTE: All timestamps contained within this report are represented as [place] Standard Time. CONFIDENTIALTY NOTICE: This fax transmission is intended only for the addressee. It contains information that is legally privileged, confidential or otherwise protected from use or disclosure. If you are not the intended recipient, you are strictly prohibited from reviewing, disclosing, copying using or disseminating any of this information or taking any action in reliance on or regarding this information. If you have received this fax in error, please notify us immediately by telephone so that we can arrange for its return to [place]. Phone: [phone], Toll-Free: [phone], Fax: [phone] Page: 1 of 1 Call Id: [id number] [facility] - Client TELEPHONE ADVICE RECORD [facility] Patient Name: Crystal Fitzpatrick DOB: Nov 16, 1945 Initial Comment Caller states she has cellulitis on leg, took antibiotic but did not clear, needs refill Nurse Assessment Nurse: Roderic Ovens, RN, Amy Date/Time Lamount Cohen Time): 06/20/2014 11:22:44 AM Confirm and document reason for call. If symptomatic, describe symptoms. ---CALLER STATES THAT SHE CAUGHT THE CELLULITIS EARLY. SHE WAS PUT ON LEVOFLOXIN. SHE STATES THAT IT IS NOW ABOVE WHERE IT ORIGINALLY WAS. LEFT LEG HAS THE CELLULITIS AND SHE IS A R LEG BKA. SHE HAD THIS DONE ABOUT 4 YEARS AGO. SHE STATES THE SIDE OF HER LEG IT IS ABOUT 2 INCHES FROM THE KNEE AT THE WORST POINT AND IT GOES ALL THE WAY DOWN TO THE FOOT. RED AND SWOLLEN. SHE DOES NOT HAVE ANY OOZING AS OF YET. SHE STATES THAT IT IS VERY HOT. SHE WAS SEEN ORIGINALLY A COUPLE OF WEEKS AGO AND SHE WAS PUT ON THE 10 DAY SUPPLY AND IT ENDED A WEEK AGO ON THURSDAY. SHE STATES BY SATURDAY IT WAS LOOKING POORLY AGAIN. SHE HAD SEEN THE PA AND SHE WAS INSTRUCTED TO CALL IF IT WORSENS. SHE IS HAVING A LOT MORE PAIN IN THE LEG AS WELL. Has the patient traveled out of the country within  the last 30 days? ---Not Applicable Does the patient require triage? ---Yes Related visit to physician within the last 2 weeks? ---Yes Does the PT have any chronic conditions? (i.e. diabetes, asthma, etc.) ---Yes List chronic conditions. ---RIGHT LEG BKA, ALLERGIES, DIABETES, HTN, Guidelines Guideline Title Affirmed Question Affirmed Notes Leg Swelling and Edema SEVERE leg swelling (e.g., swelling extends above knee, entire leg is swollen, weeping fluid) Final Disposition User See Physician within 4 Hours (or PCP triage) [place], RN, Amy Comments WILL NEED TO GET HER AN APPT AT THE OFFICE. SHE DOES NOT WANT ANYONE TO LOOK AT HER LEG OTHER THAN MD OFFICE. SHE DOES NOT WANT TO GO ANYWHERE ELSE FOR OBSERVATION. WILL LOOK AT THE SCHEDULE FOR HER AND CALL HER BACK WITH AN APPT TIME.

## 2014-06-20 NOTE — Telephone Encounter (Signed)
Ok for appt next available

## 2014-06-21 ENCOUNTER — Ambulatory Visit: Payer: Self-pay | Admitting: Internal Medicine

## 2014-06-21 ENCOUNTER — Ambulatory Visit (INDEPENDENT_AMBULATORY_CARE_PROVIDER_SITE_OTHER): Payer: Medicare Other | Admitting: Internal Medicine

## 2014-06-21 ENCOUNTER — Telehealth: Payer: Self-pay | Admitting: *Deleted

## 2014-06-21 ENCOUNTER — Encounter: Payer: Self-pay | Admitting: Internal Medicine

## 2014-06-21 VITALS — BP 122/80 | HR 88 | Temp 99.2°F

## 2014-06-21 DIAGNOSIS — L03116 Cellulitis of left lower limb: Secondary | ICD-10-CM | POA: Diagnosis not present

## 2014-06-21 DIAGNOSIS — I10 Essential (primary) hypertension: Secondary | ICD-10-CM

## 2014-06-21 DIAGNOSIS — E1159 Type 2 diabetes mellitus with other circulatory complications: Secondary | ICD-10-CM

## 2014-06-21 MED ORDER — LEVOFLOXACIN 250 MG PO TABS: 250.0000 mg | ORAL_TABLET | Freq: Every day | ORAL | Status: DC

## 2014-06-21 MED ORDER — PROMETHAZINE HCL 25 MG PO TABS: 25.0000 mg | ORAL_TABLET | Freq: Four times a day (QID) | ORAL | Status: DC | PRN

## 2014-06-21 MED ORDER — CEFTRIAXONE SODIUM 1 G IJ SOLR
1.0000 g | Freq: Once | INTRAMUSCULAR | Status: AC
  Administered 2014-06-21: 1 g via INTRAMUSCULAR

## 2014-06-21 NOTE — Progress Notes (Signed)
Pre visit review using our clinic review tool, if applicable. No additional management support is needed unless otherwise documented below in the visit note. 

## 2014-06-21 NOTE — Telephone Encounter (Signed)
[facility] - Client TELEPHONE ADVICE RECORD [facility] Patient Name: Crystal Fitzpatrick Gender: Female DOB: Sep 27, 1945 Age: 69 Y 4 M 30 D Return Phone Number: [phone] (Primary) Address: City/State/Zip: [place] [address] Client [facility] - Client Client Site  [facility] Physician [name] Contact Type Call Call Type Triage / Clinical Relationship To Patient Self Appointment Disposition EMR Appointment Scheduled Return Phone Number [phone] (Primary) Chief Complaint Wound Infection Initial Comment Caller states she has cellulitis on leg, took antibiotic but did not clear, needs refill PreDisposition Did not know what to do Info pasted into Epic Yes Nurse Assessment Nurse: Roderic Ovens, RN, Amy Date/Time (Eastern Time): 06/20/2014 11:22:44 AM Confirm and document reason for call. If symptomatic, describe symptoms. ---CALLER STATES THAT SHE CAUGHT THE CELLULITIS EARLY. SHE WAS PUT ON LEVOFLOXIN. SHE STATES THAT IT IS NOW ABOVE WHERE IT ORIGINALLY WAS. LEFT LEG HAS THE CELLULITIS AND SHE IS A R LEG BKA. SHE HAD THIS DONE ABOUT 4 YEARS AGO. SHE STATES THE SIDE OF HER LEG IT IS ABOUT 2 INCHES FROM THE KNEE AT THE WORST POINT AND IT GOES ALL THE WAY DOWN TO THE FOOT. RED AND SWOLLEN. SHE DOES NOT HAVE ANY OOZING AS OF YET. SHE STATES THAT IT IS VERY HOT. SHE WAS SEEN ORIGINALLY A COUPLE OF WEEKS AGO AND SHE WAS PUT ON THE 10 DAY SUPPLY AND IT ENDED A WEEK AGO ON THURSDAY. SHE STATES BY SATURDAY IT WAS LOOKING POORLY AGAIN. SHE HAD SEEN THE PA AND SHE WAS INSTRUCTED TO CALL IF IT WORSENS. SHE IS HAVING A LOT MORE PAIN IN THE LEG AS WELL. Has the patient traveled out of the country within the last 30 days? ---Not Applicable Does the patient require triage? ---Yes Related visit to physician within the last 2 weeks? ---Yes Does the PT have any chronic conditions? (i.e. diabetes, asthma, etc.) ---Yes List  chronic conditions. ---RIGHT LEG BKA, ALLERGIES, DIABETES, HTN, PLEASE NOTE: All timestamps contained within this report are represented as [place] Standard Time. CONFIDENTIALTY NOTICE: This fax transmission is intended only for the addressee. It contains information that is legally privileged, confidential or otherwise protected from use or disclosure. If you are not the intended recipient, you are strictly prohibited from reviewing, disclosing, copying using or disseminating any of this information or taking any action in reliance on or regarding this information. If you have received this fax in error, please notify us immediately by telephone so that we can arrange for its return to [place]. Phone: [phone], Toll-Free: [phone], Fax: [phone] Page: 2 of 2 Call Id: [id number] Guidelines Guideline Title Affirmed Question Affirmed Notes Nurse Date/Time Lamount Cohen Time) Leg Swelling and Edema SEVERE leg swelling (e.g., swelling extends above knee, entire leg is swollen, weeping fluid) Roderic Ovens, RN, Amy 06/20/2014 11:26:56 AM Disp. Time Lamount Cohen Time) Disposition Final User 06/20/2014 11:42:37 AM Send To RN Personal Roderic Ovens, RN, Amy 06/20/2014 12:08:41 PM Clinical Call Roderic Ovens, RN, Amy 06/20/2014 11:29:07 AM See Physician within 4 Hours (or PCP triage) Yes Roderic Ovens, RN, Amy Caller Understands: Yes Disagree/Comply: Comply Care Advice Given Per Guideline SEE PHYSICIAN WITHIN 4 HOURS (or PCP triage): CALL BACK IF: * You become worse. CARE ADVICE given per Leg Swelling and Edema (Adult) guideline. After Care Instructions Given Call Event Type User Date / Time Description Comments User: Brent Bulla, RN Date/Time Lamount Cohen Time): 06/20/2014 11:31:02 AM WILL NEED TO GET HER AN APPT AT THE OFFICE. SHE DOES NOT WANT ANYONE  TO LOOK AT HER LEG OTHER THAN MD OFFICE. SHE DOES NOT WANT TO GO ANYWHERE ELSE FOR OBSERVATION. WILL LOOK AT THE SCHEDULE FOR HER AND CALL HER BACK WITH AN APPT TIME. User: Brent Bulla, RN  Date/Time Lamount Cohen Time): 06/20/2014 11:42:21 AM CALLED TO CONFIRM THE 4:15 TIME AND SHE STATES THAT SHE IS CALLING TO CHECK WITH HER DRIVER AND NEED NURSE TO CALL BACK IN ABOUT 10 MINUTES. WILL DO SO. User: Brent Bulla, RN Date/Time Lamount Cohen Time): 06/20/2014 12:08:25 PM appt time confirmed for 11:15 in the am. Referrals REFERRED TO PCP OFFICE

## 2014-06-21 NOTE — Progress Notes (Signed)
Subjective:    Patient ID: Minerva Ends, female    DOB: July 11, 1945, 69 y.o.   MRN: [id number]  HPI  Here to f/u with 5 days worsening red/tender/swelling to distal LLE from approx mid leg to ankle, with also swelling and slght erythema/tender to dorsal left mid foot as well.  She cannot bend well at waist and cannot easily observe the extremity , but does not think any drainage, denies f/c, n/v or other unusual except for general weakness.  Pt denies chest pain, increased sob or doe, wheezing, orthopnea, PND, increased LE swelling, palpitations, dizziness or syncope.  Pt denies new neurological symptoms such as new headache, or facial or extremity weakness or numbness.  Pt denies polydipsia, polyuria,   Past Medical History  Diagnosis Date  . DIABETES MELLITUS, TYPE II 07/17/2008  . B12 DEFICIENCY 07/17/2008  . Acute gouty arthropathy 12/11/2008  . HYPONATREMIA 03/20/2010  . HYPERKALEMIA 10/31/2009  . ANEMIA-NOS 07/17/2008  . ANXIETY 07/17/2008  . Chronic pain syndrome 02/14/2010  . Trigeminal neuralgia 02/14/2010  . HEARING LOSS, RIGHT EAR 06/04/2009  . HYPERTENSION 07/17/2008  . CHF 01/28/2009  . Pneumonia, organism unspecified 02/14/2010  . GERD 07/17/2008  . IBS 06/04/2009  . UNSPECIFIED HEPATITIS 01/28/2009  . RENAL INSUFFICIENCY 10/31/2009  . MENOPAUSAL DISORDER 12/03/2009  . Cellulitis and abscess of leg, except foot 11/15/2008  . SKIN ULCER, CHRONIC 02/08/2009  . Rheumatoid arthritis(714.0) 07/17/2008  . SKIN LESION 06/04/2009  . ARTHRITIS 01/28/2009  . FOOT PAIN 07/17/2008  . HYPERSOMNIA 02/14/2010  . Altered mental status 02/07/2010  . PERIPHERAL EDEMA 08/30/2008  . Wheezing 12/03/2009  . ABDOMINAL DISTENSION 10/11/2008  . Dysuria 11/11/2009  . Hypoxemia 03/20/2010  . ANAPHYLACTIC SHOCK 05/15/2009  . NEUROPATHY, HX OF 01/28/2009  . COLONIC POLYPS, HX OF 07/17/2008  . AMPUTATION, BELOW KNEE, RIGHT, HX OF 01/28/2009  . Hyperlipemia 09/24/2010  . Hyperlipidemia 09/24/2010  . Seizure 10/26/2010  . Pneumonia,  aspiration 12/01/2011    Episode July 2013, tx per [place]   Past Surgical History  Procedure Laterality Date  . Amputation  02/02/2008    below right knee  . S/p egd  2007    with Botox for? Achalasia  . Tonsillectomy    . S/p breast biopsy  1992  . 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 Tubal ligation    . Nasal septum surgery    . Foot surgury  1980 and 1981    reports that she has quit smoking. She has never used smokeless tobacco. She reports that she does not drink alcohol or use illicit drugs. family history includes Breast cancer in her sister; Diabetes in her other; Stroke in her other. Allergies  Allergen Reactions  . Morphine Anaphylaxis  . Sulfonamide Derivatives Diarrhea  . Ciprofloxacin Diarrhea  . Clindamycin/Lincomycin Diarrhea  . Doxycycline     Nausea, diarrhea  . Lyrica [Pregabalin]   . Nitrofurantoin     REACTION: itch  . Prednisone     Fast heart rate  . Erythromycin Rash   Current Outpatient Prescriptions on File Prior to Visit  Medication Sig Dispense Refill  . albuterol (PROAIR HFA) 108 (90 BASE) MCG/ACT inhaler Inhale 2 puffs into the lungs every 6 (six) hours as needed.      . ALPRAZolam (XANAX) 0.25 MG tablet TAKE ONE TABLET 3 TIMES A DAY AS NEEDED 90 tablet 2  . Ascorbic Acid (VITAMIN C) 500 MG tablet Take 500 mg by  mouth daily.      [name] BENICAR 40 MG tablet TAKE 1 TABLET BY MOUTH EVERY DAY FOR BLOOD PRESSURE 30 tablet 5  . Blood Glucose Monitoring Suppl (ONE TOUCH ULTRA 2) W/DEVICE KIT 1 Device by Does not apply route once. 1 each 0  . Blood Glucose Monitoring Suppl (ONE TOUCH ULTRA SYSTEM KIT) W/DEVICE KIT 1 kit by Does not apply route once. 1 each 0  . cyclobenzaprine (FLEXERIL) 10 MG tablet TAKE 1 TABLET BY MOUTH EVERY 8 HOURS AS NEEDED 1 month supply 90 tablet 3  . diclofenac sodium (VOLTAREN) 1 % GEL Apply 2 g topically 4 (four) times daily. 3 Tube 3  . diphenoxylate-atropine (LOMOTIL) 2.5-0.025 MG per tablet TAKE 1  TABLET BY MOUTH 2 TIMES A DAY AS NEEDED 60 tablet 1  . DULoxetine (CYMBALTA) 60 MG capsule TAKE 2 CAPSULES BY MOUTH DAILY 60 capsule 3  . EPINEPHrine (EPIPEN 2-PAK) 0.3 mg/0.3 mL DEVI Inject 0.3 mLs (0.3 mg total) into the muscle once. Use as directed 1 Device 0  . EPINEPHrine (EPIPEN) 0.3 mg/0.3 mL IJ SOAJ injection Inject 0.3 mLs (0.3 mg total) into the muscle once. 1 Device 2  . fentaNYL (DURAGESIC - DOSED MCG/HR) 75 MCG/HR Place 1 patch (75 mcg total) onto the skin every 3 (three) days. 1 patch every 3 days 10 patch 0  . furosemide (LASIX) 40 MG tablet Take 1 tablet (40 mg total) by mouth daily. 90 tablet 3  . gabapentin (NEURONTIN) 600 MG tablet Take 1 tablet (600 mg total) by mouth 3 (three) times daily. 90 tablet 3  . lidocaine (LIDODERM) 5 % Place 1 patch onto the skin daily. Remove & Discard patch within 12 hours or as directed by MD 30 patch 3  . meclizine (ANTIVERT) 25 MG tablet Take 1 tablet (25 mg total) by mouth 3 (three) times daily as needed. 30 tablet 0  . Multiple Vitamins-Minerals (CENTRUM SILVER ULTRA WOMENS PO) Take by mouth daily.      [name] omega-3 acid ethyl esters (LOVAZA) 1 G capsule Take 1 capsule (1 g total) by mouth daily. 90 capsule 3  . Omega-3 Fatty Acids (FISH OIL) 1000 MG CAPS Take by mouth daily.      Glory Rosebush DELICA LANCETS MISC Use as directed to test blood sugar dx 250.00 100 each 2  . oxyCODONE-acetaminophen (PERCOCET) 10-325 MG per tablet Take 1 tablet by mouth every 6 (six) hours as needed for pain. 120 tablet 0  . pantoprazole (PROTONIX) 40 MG tablet TAKE ONE (1) TABLET BY MOUTH EVERY DAY  FOR ACID REFLUX 30 tablet 11  . Psyllium (METAMUCIL) 30.9 % POWD Take by mouth. Take as directed     . topiramate (TOPAMAX) 100 MG tablet TAKE 3 TABLETS BY MOUTH EACH NIGHT AT BEDTIME 90 tablet 3   No current facility-administered medications on file prior to visit.   Review of Systems  Constitutional: Negative for unusual diaphoresis or other sweats  HENT: Negative for  ringing in ear Eyes: Negative for double vision or worsening visual disturbance.  Respiratory: Negative for choking and stridor.   Gastrointestinal: Negative for vomiting or other signifcant bowel change Genitourinary: Negative for hematuria or decreased urine volume.  Musculoskeletal: Negative for other MSK pain or swelling Skin: Negative for color change and worsening wound.  Neurological: Negative for tremors and numbness other than noted  Psychiatric/Behavioral: Negative for decreased concentration or agitation other than above       Objective:   Physical Exam BP 122/80 mmHg  Pulse 88  Temp(Src) 99.2 F (37.3 C) (Oral)  SpO2 93% VS noted,  Constitutional: Pt appears well-developed, well-nourished.  HENT: Head: NCAT.  Right Ear: External ear normal.  Left Ear: External ear normal.  Eyes: . Pupils are equal, round, and reactive to light. Conjunctivae and EOM are normal Neck: Normal range of motion. Neck supple.  Cardiovascular: Normal rate and regular rhythm.   Pulmonary/Chest: Effort normal and breath sounds without rales or wheezing.  Abd:  Soft, NT, ND, + BS Neurological: Pt is alert. Not confused , motor grossly intact Skin: with 2+ red/tender/swelling from left mid leg to ankle with most tender to post aspect, no drainage or abscess Psychiatric: Pt behavior is normal. No agitation.      Assessment & Plan:

## 2014-06-21 NOTE — Telephone Encounter (Signed)
Already scheduled

## 2014-06-21 NOTE — Patient Instructions (Signed)
You had the antibiotic shot today (rocephin)  Please take all new medication as prescribed - the levaquin  Please continue all other medications as before, and refills have been done if requested.  Please have the pharmacy call with any other refills you may need.  Please keep your appointments with your specialists as you may have planned

## 2014-06-22 NOTE — Assessment & Plan Note (Addendum)
mod, for rocephin IM now, then levaquin asd,  to f/u any worsening symptoms or concerns but go to ER for any worsening

## 2014-06-22 NOTE — Assessment & Plan Note (Signed)
stable overall by history and exam, recent data reviewed with pt, and pt to continue medical treatment as before,  to f/u any worsening symptoms or concerns Lab Results  Component Value Date   HGBA1C 6.4 07/25/2013

## 2014-06-22 NOTE — Assessment & Plan Note (Signed)
stable overall by history and exam, recent data reviewed with pt, and pt to continue medical treatment as before,  to f/u any worsening symptoms or concerns BP Readings from Last 3 Encounters:  06/21/14 122/80  06/06/14 160/72  06/06/14 128/61

## 2014-06-22 NOTE — Telephone Encounter (Signed)
Tried calling pt about her med refill. Left message for her to call back.

## 2014-06-25 ENCOUNTER — Other Ambulatory Visit: Payer: Self-pay | Admitting: Internal Medicine

## 2014-06-27 ENCOUNTER — Other Ambulatory Visit: Payer: Self-pay | Admitting: Internal Medicine

## 2014-06-27 ENCOUNTER — Other Ambulatory Visit: Payer: Self-pay | Admitting: *Deleted

## 2014-06-27 MED ORDER — GABAPENTIN 600 MG PO TABS: 600.0000 mg | ORAL_TABLET | Freq: Three times a day (TID) | ORAL | Status: DC

## 2014-07-02 ENCOUNTER — Telehealth: Payer: Self-pay | Admitting: Internal Medicine

## 2014-07-02 NOTE — Telephone Encounter (Signed)
Is requesting antibiotic to be refilled for infection in leg.  Patient states she has improved but would like refill to finish off infection.  Patient uses [facility].  PH # [phone].

## 2014-07-03 NOTE — Telephone Encounter (Signed)
Left message for pt to call back  °

## 2014-07-03 NOTE — Telephone Encounter (Signed)
Some redness to the leg can persist even when infection has improved  Please return for worsening redness, fever, swelling or pain; thanks

## 2014-07-04 ENCOUNTER — Ambulatory Visit (INDEPENDENT_AMBULATORY_CARE_PROVIDER_SITE_OTHER): Payer: Medicare Other | Admitting: Internal Medicine

## 2014-07-04 ENCOUNTER — Encounter: Payer: Self-pay | Admitting: Internal Medicine

## 2014-07-04 VITALS — BP 120/80 | HR 80 | Temp 99.2°F

## 2014-07-04 DIAGNOSIS — L03116 Cellulitis of left lower limb: Secondary | ICD-10-CM | POA: Diagnosis not present

## 2014-07-04 DIAGNOSIS — I519 Heart disease, unspecified: Secondary | ICD-10-CM

## 2014-07-04 DIAGNOSIS — I10 Essential (primary) hypertension: Secondary | ICD-10-CM | POA: Diagnosis not present

## 2014-07-04 DIAGNOSIS — I5189 Other ill-defined heart diseases: Secondary | ICD-10-CM

## 2014-07-04 MED ORDER — CEPHALEXIN 500 MG PO CAPS: 500.0000 mg | ORAL_CAPSULE | Freq: Four times a day (QID) | ORAL | Status: DC

## 2014-07-04 MED ORDER — CEFTRIAXONE SODIUM 1 G IJ SOLR
1.0000 g | Freq: Once | INTRAMUSCULAR | Status: AC
  Administered 2014-07-04: 1 g via INTRAMUSCULAR

## 2014-07-04 NOTE — Assessment & Plan Note (Signed)
O/w stable, without volume increased to exacerbate the cellulitis,cont same tx

## 2014-07-04 NOTE — Progress Notes (Signed)
Pre visit review using our clinic review tool, if applicable. No additional management support is needed unless otherwise documented below in the visit note. 

## 2014-07-04 NOTE — Patient Instructions (Addendum)
You had the antibiotic shot today  Please take all new medication as prescribed - the cephalexin for 14 days  Please call if worsening fever, chills, pain, redness, swelling in the next few days, as we could consider change to zyvox.  Please continue all other medications as before, and refills have been done if requested.  Please have the pharmacy call with any other refills you may need.  Please keep your appointments with your specialists as you may have planned

## 2014-07-04 NOTE — Telephone Encounter (Signed)
Patient was seen today in the office.

## 2014-07-04 NOTE — Assessment & Plan Note (Addendum)
Recurrent, did seem to improve with levaquin so possibly not MRSA related, has mult antibx allergy/intolerance, for cephalexin course to start, but consdier change to zyvox if able for any wrosening fever, red/pain; also for rocephin IM today

## 2014-07-04 NOTE — Assessment & Plan Note (Signed)
stable overall by history and exam, recent data reviewed with pt, and pt to continue medical treatment as before,  to f/u any worsening symptoms or concerns BP Readings from Last 3 Encounters:  07/04/14 120/80  06/21/14 122/80  06/06/14 160/72

## 2014-07-04 NOTE — Progress Notes (Signed)
Subjective:    Patient ID: Minerva Ends, female    DOB: [birth year], 69 y.o.   MRN: [id number]  HPI  Here to f/u, did well with levaquin and finished course, but unfortunately after last dose 2 days, has developed low grade temp and more redness/swelling/tender in slightly different area and not as severe as last visit, but seems to be coming back fast. Previous redness went away only 80-90%, felt likely she needed a bit longer antibx  Does have mult antibx intol's including sulfa, clinda, and doxy.  Has not tried cephalexin,  No chills, Denies worsening reflux, abd pain, dysphagia, n/v, bowel change or blood.  Pt denies chest pain, increased sob or doe, wheezing, orthopnea, PND, increased LE swelling, palpitations, dizziness or syncope.  Has a old meter at home to check sugar with recent glc 25, then 35 then 45 with eating but not feel bad, is not taking anti-glc meds. Pt denies polydipsia, polyuria, Past Medical History  Diagnosis Date  . DIABETES MELLITUS, TYPE II 07/17/2008  . B12 DEFICIENCY 07/17/2008  . Acute gouty arthropathy 12/11/2008  . HYPONATREMIA 03/20/2010  . HYPERKALEMIA 10/31/2009  . ANEMIA-NOS 07/17/2008  . ANXIETY 07/17/2008  . Chronic pain syndrome 02/14/2010  . Trigeminal neuralgia 02/14/2010  . HEARING LOSS, RIGHT EAR 06/04/2009  . HYPERTENSION 07/17/2008  . CHF 01/28/2009  . Pneumonia, organism unspecified 02/14/2010  . GERD 07/17/2008  . IBS 06/04/2009  . UNSPECIFIED HEPATITIS 01/28/2009  . RENAL INSUFFICIENCY 10/31/2009  . MENOPAUSAL DISORDER 12/03/2009  . Cellulitis and abscess of leg, except foot 11/15/2008  . SKIN ULCER, CHRONIC 02/08/2009  . Rheumatoid arthritis(714.0) 07/17/2008  . SKIN LESION 06/04/2009  . ARTHRITIS 01/28/2009  . FOOT PAIN 07/17/2008  . HYPERSOMNIA 02/14/2010  . Altered mental status 02/07/2010  . PERIPHERAL EDEMA 08/30/2008  . Wheezing 12/03/2009  . ABDOMINAL DISTENSION 10/11/2008  . Dysuria 11/11/2009  . Hypoxemia 03/20/2010  . ANAPHYLACTIC SHOCK 05/15/2009  .  NEUROPATHY, HX OF 01/28/2009  . COLONIC POLYPS, HX OF 07/17/2008  . AMPUTATION, BELOW KNEE, RIGHT, HX OF 01/28/2009  . Hyperlipemia 09/24/2010  . Hyperlipidemia 09/24/2010  . Seizure 10/26/2010  . Pneumonia, aspiration 12/01/2011    Episode July 2013, tx per [place]   Past Surgical History  Procedure Laterality Date  . Amputation  02/02/2008    below right knee  . S/p egd  2007    with Botox for? Achalasia  . Tonsillectomy    . S/p breast biopsy  1992  . 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 Tubal ligation    . Nasal septum surgery    . Foot surgury  1980 and 1981    reports that she has quit smoking. She has never used smokeless tobacco. She reports that she does not drink alcohol or use illicit drugs. family history includes Breast cancer in her sister; Diabetes in her other; Stroke in her other. Allergies  Allergen Reactions  . Morphine Anaphylaxis  . Sulfonamide Derivatives Diarrhea  . Ciprofloxacin Diarrhea  . Clindamycin/Lincomycin Diarrhea  . Doxycycline     Nausea, diarrhea  . Lyrica [Pregabalin]   . Nitrofurantoin     REACTION: itch  . Prednisone     Fast heart rate  . Erythromycin Rash   Current Outpatient Prescriptions on File Prior to Visit  Medication Sig Dispense Refill  . albuterol (PROAIR HFA) 108 (90 BASE) MCG/ACT inhaler Inhale 2 puffs into the lungs every 6 (six) hours as needed.      [name]  ALPRAZolam (XANAX) 0.25 MG tablet TAKE ONE TABLET 3 TIMES A DAY AS NEEDED 90 tablet 2  . Ascorbic Acid (VITAMIN C) 500 MG tablet Take 500 mg by mouth daily.      [name] BENICAR 40 MG tablet TAKE 1 TABLET BY MOUTH EVERY DAY FOR BLOOD PRESSURE 30 tablet 5  . Blood Glucose Monitoring Suppl (ONE TOUCH ULTRA 2) W/DEVICE KIT 1 Device by Does not apply route once. 1 each 0  . Blood Glucose Monitoring Suppl (ONE TOUCH ULTRA SYSTEM KIT) W/DEVICE KIT 1 kit by Does not apply route once. 1 each 0  . cyclobenzaprine (FLEXERIL) 10 MG tablet TAKE 1 TABLET BY MOUTH EVERY  8 HOURS AS NEEDED 1 month supply 90 tablet 3  . diclofenac sodium (VOLTAREN) 1 % GEL Apply 2 g topically 4 (four) times daily. 3 Tube 3  . diphenoxylate-atropine (LOMOTIL) 2.5-0.025 MG per tablet TAKE 1 TABLET BY MOUTH 2 TIMES A DAY AS NEEDED 60 tablet 1  . DULoxetine (CYMBALTA) 60 MG capsule TAKE 2 CAPSULES BY MOUTH DAILY 60 capsule 3  . EPINEPHrine (EPIPEN 2-PAK) 0.3 mg/0.3 mL DEVI Inject 0.3 mLs (0.3 mg total) into the muscle once. Use as directed 1 Device 0  . EPINEPHrine (EPIPEN) 0.3 mg/0.3 mL IJ SOAJ injection Inject 0.3 mLs (0.3 mg total) into the muscle once. 1 Device 2  . fentaNYL (DURAGESIC - DOSED MCG/HR) 75 MCG/HR Place 1 patch (75 mcg total) onto the skin every 3 (three) days. 1 patch every 3 days 10 patch 0  . furosemide (LASIX) 40 MG tablet TAKE ONE (1) TABLET EACH DAY 90 tablet 3  . gabapentin (NEURONTIN) 600 MG tablet Take 1 tablet (600 mg total) by mouth 3 (three) times daily. 90 tablet 3  . lidocaine (LIDODERM) 5 % Place 1 patch onto the skin daily. Remove & Discard patch within 12 hours or as directed by MD 30 patch 3  . meclizine (ANTIVERT) 25 MG tablet Take 1 tablet (25 mg total) by mouth 3 (three) times daily as needed. 30 tablet 0  . Multiple Vitamins-Minerals (CENTRUM SILVER ULTRA WOMENS PO) Take by mouth daily.      [name] omega-3 acid ethyl esters (LOVAZA) 1 G capsule Take 1 capsule (1 g total) by mouth daily. 90 capsule 3  . Omega-3 Fatty Acids (FISH OIL) 1000 MG CAPS Take by mouth daily.      Glory Rosebush DELICA LANCETS MISC Use as directed to test blood sugar dx 250.00 100 each 2  . oxyCODONE-acetaminophen (PERCOCET) 10-325 MG per tablet Take 1 tablet by mouth every 6 (six) hours as needed for pain. 120 tablet 0  . pantoprazole (PROTONIX) 40 MG tablet TAKE ONE (1) TABLET BY MOUTH EVERY DAY  FOR ACID REFLUX 30 tablet 11  . promethazine (PHENERGAN) 25 MG tablet Take 1 tablet (25 mg total) by mouth every 6 (six) hours as needed. for nausea 60 tablet 1  . promethazine (PHENERGAN)  25 MG tablet TAKE 1 TABLET BY MOUTH EVERY 6 HOURS AS NEEDED FOR NAUSEA 60 tablet 0  . Psyllium (METAMUCIL) 30.9 % POWD Take by mouth. Take as directed     . topiramate (TOPAMAX) 100 MG tablet TAKE 3 TABLETS BY MOUTH EACH NIGHT AT BEDTIME 90 tablet 3   No current facility-administered medications on file prior to visit.   Review of Systems  Constitutional: Negative for unusual diaphoresis or other sweats  HENT: Negative for ringing in ear Eyes: Negative for double vision or worsening visual disturbance.  Respiratory: Negative  for choking and stridor.   Gastrointestinal: Negative for vomiting or other signifcant bowel change Genitourinary: Negative for hematuria or decreased urine volume.  Musculoskeletal: Negative for other MSK pain or swelling Skin: Negative for color change and worsening wound.  Neurological: Negative for tremors and numbness other than noted  Psychiatric/Behavioral: Negative for decreased concentration or agitation other than above       Objective:   Physical Exam BP 120/80 mmHg  Pulse 80  Temp(Src) 99.2 F (37.3 C) (Oral)  Wt  VS noted, non toxic, joking as usual, smile easily Constitutional: Pt appears well-developed, well-nourished.  HENT: Head: NCAT.  Right Ear: External ear normal.  Left Ear: External ear normal.  Eyes: . Pupils are equal, round, and reactive to light. Conjunctivae and EOM are normal Neck: Normal range of motion. Neck supple.  Cardiovascular: Normal rate and regular rhythm.   Pulmonary/Chest: Effort normal and breath sounds without rales or wheezing.  Neurological: Pt is alert. Not confused , motor grossly intact Skin: Skin is warm/red/tender this time at the distal leg only the distal 6 cm anteriorly above the ankle with some medial leg extension, but none laterally this time, no red streaks, no fluctucance or abscess Psychiatric: Pt behavior is normal. No agitation.     Assessment & Plan:

## 2014-07-06 ENCOUNTER — Encounter: Payer: Self-pay | Admitting: Registered Nurse

## 2014-07-06 ENCOUNTER — Encounter: Payer: Medicare Other | Attending: Physical Medicine and Rehabilitation | Admitting: Registered Nurse

## 2014-07-06 ENCOUNTER — Telehealth: Payer: Self-pay | Admitting: Internal Medicine

## 2014-07-06 VITALS — BP 112/60 | HR 97 | Resp 14

## 2014-07-06 DIAGNOSIS — M179 Osteoarthritis of knee, unspecified: Secondary | ICD-10-CM | POA: Diagnosis not present

## 2014-07-06 DIAGNOSIS — Z5181 Encounter for therapeutic drug level monitoring: Secondary | ICD-10-CM

## 2014-07-06 DIAGNOSIS — Z79899 Other long term (current) drug therapy: Secondary | ICD-10-CM | POA: Diagnosis not present

## 2014-07-06 DIAGNOSIS — Z89511 Acquired absence of right leg below knee: Secondary | ICD-10-CM | POA: Diagnosis not present

## 2014-07-06 DIAGNOSIS — G894 Chronic pain syndrome: Secondary | ICD-10-CM

## 2014-07-06 DIAGNOSIS — M069 Rheumatoid arthritis, unspecified: Secondary | ICD-10-CM | POA: Diagnosis present

## 2014-07-06 DIAGNOSIS — M4716 Other spondylosis with myelopathy, lumbar region: Secondary | ICD-10-CM | POA: Diagnosis present

## 2014-07-06 DIAGNOSIS — Z89519 Acquired absence of unspecified leg below knee: Secondary | ICD-10-CM

## 2014-07-06 DIAGNOSIS — M1712 Unilateral primary osteoarthritis, left knee: Secondary | ICD-10-CM

## 2014-07-06 DIAGNOSIS — L03116 Cellulitis of left lower limb: Secondary | ICD-10-CM | POA: Diagnosis not present

## 2014-07-06 DIAGNOSIS — G609 Hereditary and idiopathic neuropathy, unspecified: Secondary | ICD-10-CM | POA: Diagnosis present

## 2014-07-06 MED ORDER — OXYCODONE-ACETAMINOPHEN 10-325 MG PO TABS: 1.0000 | ORAL_TABLET | Freq: Four times a day (QID) | ORAL | Status: DC | PRN

## 2014-07-06 MED ORDER — FENTANYL 75 MCG/HR TD PT72: 75.0000 ug | MEDICATED_PATCH | TRANSDERMAL | Status: DC

## 2014-07-06 NOTE — Progress Notes (Signed)
Subjective:    Patient ID: Crystal Fitzpatrick, female    DOB: [birth year], 69 y.o.   MRN: [id number]  HPI: Crystal Fitzpatrick is a 69 year old female who returns for follow up for chronic pain and medication refill. She says her pain is located in her lower back, left leg and left foot. She rates her pain 7. Her current exercise regime is Yoga seven days a week and performing stretching exercises. She arrived to clinic in wheelchair   Pain Inventory Average Pain 7 Pain Right Now 7 My pain is constant  In the last 24 hours, has pain interfered with the following? General activity 0 Relation with others 0 Enjoyment of life 0 What TIME of day is your pain at its worst? morning , night Sleep (in general) Poor  Pain is worse with: sitting, inactivity and some activites Pain improves with: medication Relief from Meds: 7  Mobility ability to climb steps?  no do you drive?  no use a wheelchair Do you have any goals in this area?  yes  Function retired  Neuro/Psych No problems in this area  Prior Studies Any changes since last visit?  no  Physicians involved in your care Any changes since last visit?  no   Family History  Problem Relation Age of Onset  . Breast cancer Sister   . Diabetes Other   . Stroke Other     Grandparents   History   Social History  . Marital Status: Divorced    Spouse Name: N/A  . Number of Children: 1  . Years of Education: N/A   Occupational History  . retired Marketing executive    Social History Main Topics  . Smoking status: Former Games developer  . Smokeless tobacco: Never Used  . Alcohol Use: No  . Drug Use: No  . Sexual Activity: Not on file   Other Topics Concern  . None   Social History Narrative   Past Surgical History  Procedure Laterality Date  . Amputation  02/02/2008    below right knee  . S/p egd  2007    with Botox for? Achalasia  . Tonsillectomy    . S/p breast biopsy  1992  . S/p bka  9/09    For DFU and  Osteomyelitis  . Tubal ligation    . Nasal septum surgery    . Foot surgury  1980 and 1981   Past Medical History  Diagnosis Date  . DIABETES MELLITUS, TYPE II 07/17/2008  . B12 DEFICIENCY 07/17/2008  . Acute gouty arthropathy 12/11/2008  . HYPONATREMIA 03/20/2010  . HYPERKALEMIA 10/31/2009  . ANEMIA-NOS 07/17/2008  . ANXIETY 07/17/2008  . Chronic pain syndrome 02/14/2010  . Trigeminal neuralgia 02/14/2010  . HEARING LOSS, RIGHT EAR 06/04/2009  . HYPERTENSION 07/17/2008  . CHF 01/28/2009  . Pneumonia, organism unspecified 02/14/2010  . GERD 07/17/2008  . IBS 06/04/2009  . UNSPECIFIED HEPATITIS 01/28/2009  . RENAL INSUFFICIENCY 10/31/2009  . MENOPAUSAL DISORDER 12/03/2009  . Cellulitis and abscess of leg, except foot 11/15/2008  . SKIN ULCER, CHRONIC 02/08/2009  . Rheumatoid arthritis(714.0) 07/17/2008  . SKIN LESION 06/04/2009  . ARTHRITIS 01/28/2009  . FOOT PAIN 07/17/2008  . HYPERSOMNIA 02/14/2010  . Altered mental status 02/07/2010  . PERIPHERAL EDEMA 08/30/2008  . Wheezing 12/03/2009  . ABDOMINAL DISTENSION 10/11/2008  . Dysuria 11/11/2009  . Hypoxemia 03/20/2010  . ANAPHYLACTIC SHOCK 05/15/2009  . NEUROPATHY, HX OF 01/28/2009  . COLONIC POLYPS, HX OF 07/17/2008  . AMPUTATION, BELOW  KNEE, RIGHT, HX OF 01/28/2009  . Hyperlipemia 09/24/2010  . Hyperlipidemia 09/24/2010  . Seizure 10/26/2010  . Pneumonia, aspiration 12/01/2011    Episode July 2013, tx per [facility]   BP 112/60 mmHg  Pulse 97  Resp 14  SpO2 96%  Opioid Risk Score:   Fall Risk Score: High Fall Risk (>13 points)  Review of Systems  All other systems reviewed and are negative.      Objective:   Physical Exam  Constitutional: She is oriented to person, place, and time. She appears well-developed and well-nourished.  HENT:  Head: Normocephalic and atraumatic.  Neck: Normal range of motion. Neck supple.  Cardiovascular: Normal rate and regular rhythm.   Pulmonary/Chest: Effort normal and breath sounds normal.    Musculoskeletal:  Normal Muscle Bulk and Muscle Testing Reveals: Upper Extremities: Full ROM and Muscle Strength 5/5 Thoracic Paraspinal tenderness: T-5- T-7 Lower Extremities: Right BKA Left: Full ROM and Muscle Strength 5/5 Left Lower Extremity Flexion Produces pain into Patella Arrived in wheelchair   Neurological: She is alert and oriented to person, place, and time.  Skin: Skin is warm and dry.  Left Lower Extremity reddened  Nursing note and vitals reviewed.         Assessment & Plan:  1.Right below-knee amputation, history of phantom limb pain.: No complaints at this time regarding phantom pain.Continue Current Medication Regime. Continue to Monitor.  2. Peripheral neuropathy: Continue Gabapentin.  Refilled: Fenatnyl Patch 75 mcg one patch every three days #10 and Oxycodone 10/325mg  one tablet every 6 hours as needed #120  3. History of rheumatoid arthritis. Continue Current Medication and Exercise and heat Therapy.  4. Osteoarthritis of left knee: Continue with Voltaren gel/ Exercise and heat therapy.  5. Chronic cellulitis, left leg with associated edema: PMD Following   15 minutes of face to face patient care time was spent during this visit. All questions were encouraged and answered.   F/U in 1 month

## 2014-07-06 NOTE — Telephone Encounter (Signed)
emmi emailed °

## 2014-07-10 ENCOUNTER — Ambulatory Visit: Payer: Medicare Other | Admitting: Internal Medicine

## 2014-07-11 ENCOUNTER — Other Ambulatory Visit: Payer: Self-pay | Admitting: *Deleted

## 2014-07-11 MED ORDER — TOPIRAMATE 100 MG PO TABS: ORAL_TABLET | ORAL | Status: DC

## 2014-07-11 NOTE — Telephone Encounter (Signed)
Recd faxed refill request for Topiramate 100 mg tab #90 #[id number] 3.  Sent in electronically

## 2014-07-26 ENCOUNTER — Telehealth: Payer: Self-pay | Admitting: *Deleted

## 2014-07-26 NOTE — Telephone Encounter (Signed)
Recd letter from [facility] stating that the Flexeril 10 mg has been denied for coverage. Insurance will cover Tizanidine.

## 2014-07-26 NOTE — Telephone Encounter (Signed)
i would like to appeal. We have used tizanidine in the past and it has made her sleepy.  thanks

## 2014-08-01 ENCOUNTER — Encounter: Payer: Self-pay | Admitting: *Deleted

## 2014-08-01 NOTE — Telephone Encounter (Signed)
Submitted coverage determination appeal for Flexeril per Dr. Riley Kill. Sent by fax, confirmation recd.

## 2014-08-03 ENCOUNTER — Encounter: Payer: Medicare Other | Attending: Physical Medicine and Rehabilitation | Admitting: Registered Nurse

## 2014-08-03 ENCOUNTER — Encounter: Payer: Self-pay | Admitting: Registered Nurse

## 2014-08-03 VITALS — BP 125/59 | HR 75 | Resp 14

## 2014-08-03 DIAGNOSIS — Z89519 Acquired absence of unspecified leg below knee: Secondary | ICD-10-CM | POA: Diagnosis present

## 2014-08-03 DIAGNOSIS — G609 Hereditary and idiopathic neuropathy, unspecified: Secondary | ICD-10-CM

## 2014-08-03 DIAGNOSIS — Z5181 Encounter for therapeutic drug level monitoring: Secondary | ICD-10-CM | POA: Diagnosis not present

## 2014-08-03 DIAGNOSIS — M1712 Unilateral primary osteoarthritis, left knee: Secondary | ICD-10-CM

## 2014-08-03 DIAGNOSIS — M069 Rheumatoid arthritis, unspecified: Secondary | ICD-10-CM | POA: Insufficient documentation

## 2014-08-03 DIAGNOSIS — Z79899 Other long term (current) drug therapy: Secondary | ICD-10-CM | POA: Diagnosis not present

## 2014-08-03 DIAGNOSIS — M1990 Unspecified osteoarthritis, unspecified site: Secondary | ICD-10-CM

## 2014-08-03 DIAGNOSIS — M4716 Other spondylosis with myelopathy, lumbar region: Secondary | ICD-10-CM

## 2014-08-03 DIAGNOSIS — M179 Osteoarthritis of knee, unspecified: Secondary | ICD-10-CM | POA: Insufficient documentation

## 2014-08-03 DIAGNOSIS — G894 Chronic pain syndrome: Secondary | ICD-10-CM | POA: Diagnosis not present

## 2014-08-03 MED ORDER — FENTANYL 75 MCG/HR TD PT72: 75.0000 ug | MEDICATED_PATCH | TRANSDERMAL | Status: DC

## 2014-08-03 MED ORDER — OXYCODONE-ACETAMINOPHEN 10-325 MG PO TABS: 1.0000 | ORAL_TABLET | Freq: Four times a day (QID) | ORAL | Status: DC | PRN

## 2014-08-03 NOTE — Progress Notes (Signed)
Subjective:    Patient ID: Crystal Fitzpatrick, female    DOB: 02/16/1946, 69 y.o.   MRN: [id number]  HPI: [name] is a 69 year old female who returns for follow up for chronic pain and medication refill. She says her pain is located in her lower back and left hip. She rates her pain 6. Her current exercise regime is Yoga seven days a week and performing stretching exercises with bands.Also chair exercises daily.  Arrived to clinic in wheelchair.  Pain Inventory Average Pain 6 Pain Right Now 6 My pain is sharp, burning, tingling and aching  In the last 24 hours, has pain interfered with the following? General activity 2 Relation with others 1 Enjoyment of life 0 What TIME of day is your pain at its worst? morning and night Sleep (in general) Poor  Pain is worse with: sitting and inactivity Pain improves with: medication Relief from Meds: 6  Mobility do you drive?  no use a wheelchair needs help with transfers  Function disabled: date disabled 01/31/2008 I need assistance with the following:  meal prep  Neuro/Psych No problems in this area  Prior Studies Any changes since last visit?  no  Physicians involved in your care Any changes since last visit?  no   Family History  Problem Relation Age of Onset  . Breast cancer Sister   . Diabetes Other   . Stroke Other     Grandparents   History   Social History  . Marital Status: Divorced    Spouse Name: N/A  . Number of Children: 1  . Years of Education: N/A   Occupational History  . retired Marketing executive    Social History Main Topics  . Smoking status: Former Games developer  . Smokeless tobacco: Never Used  . Alcohol Use: No  . Drug Use: No  . Sexual Activity: Not on file   Other Topics Concern  . None   Social History Narrative   Past Surgical History  Procedure Laterality Date  . Amputation  02/02/2008    below right knee  . S/p egd  2007    with Botox for? Achalasia  .  Tonsillectomy    . S/p breast biopsy  1992  . 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 Tubal ligation    . Nasal septum surgery    . Foot surgury  1980 and 1981   Past Medical History  Diagnosis Date  . DIABETES MELLITUS, TYPE II 07/17/2008  . B12 DEFICIENCY 07/17/2008  . Acute gouty arthropathy 12/11/2008  . HYPONATREMIA 03/20/2010  . HYPERKALEMIA 10/31/2009  . ANEMIA-NOS 07/17/2008  . ANXIETY 07/17/2008  . Chronic pain syndrome 02/14/2010  . Trigeminal neuralgia 02/14/2010  . HEARING LOSS, RIGHT EAR 06/04/2009  . HYPERTENSION 07/17/2008  . CHF 01/28/2009  . Pneumonia, organism unspecified 02/14/2010  . GERD 07/17/2008  . IBS 06/04/2009  . UNSPECIFIED HEPATITIS 01/28/2009  . RENAL INSUFFICIENCY 10/31/2009  . MENOPAUSAL DISORDER 12/03/2009  . Cellulitis and abscess of leg, except foot 11/15/2008  . SKIN ULCER, CHRONIC 02/08/2009  . Rheumatoid arthritis(714.0) 07/17/2008  . SKIN LESION 06/04/2009  . ARTHRITIS 01/28/2009  . FOOT PAIN 07/17/2008  . HYPERSOMNIA 02/14/2010  . Altered mental status 02/07/2010  . PERIPHERAL EDEMA 08/30/2008  . Wheezing 12/03/2009  . ABDOMINAL DISTENSION 10/11/2008  . Dysuria 11/11/2009  . Hypoxemia 03/20/2010  . ANAPHYLACTIC SHOCK 05/15/2009  . NEUROPATHY, HX OF 01/28/2009  . COLONIC POLYPS, HX OF 07/17/2008  .  AMPUTATION, BELOW KNEE, RIGHT, HX OF 01/28/2009  . Hyperlipemia 09/24/2010  . Hyperlipidemia 09/24/2010  . Seizure 10/26/2010  . Pneumonia, aspiration 12/01/2011    Episode July 2013, tx per [facility]   BP 125/59 mmHg  Pulse 75  Resp 14  SpO2 92%  Opioid Risk Score:   Fall Risk Score: High Fall Risk (>13 points) (has been educated previously and given handout)`1  Depression screen PHQ 2/9  Depression screen [facility] 2/9 08/03/2014 07/04/2014 12/12/2012  Decreased Interest 0 0 0  Down, Depressed, Hopeless 1 0 1  PHQ - 2 Score 1 0 1  Altered sleeping 2 - -  Tired, decreased energy 0 - -  Change in appetite 1 - -  Feeling bad or failure about yourself   2 - -  Trouble concentrating 0 - -  Moving slowly or fidgety/restless 0 - -  Suicidal thoughts 0 - -  PHQ-9 Score 6 - -    Review of Systems  Skin: Positive for color change and wound.       Left leg redness from mid shin down and swelling returning.  Tegaderm over skin tear and encouraged to elevate  All other systems reviewed and are negative.      Objective:   Physical Exam  Constitutional: She is oriented to person, place, and time. She appears well-developed and well-nourished.  HENT:  Head: Normocephalic and atraumatic.  Neck: Normal range of motion. Neck supple.  Cardiovascular: Normal rate and regular rhythm.   Pulmonary/Chest: Effort normal and breath sounds normal.  Musculoskeletal:  Normal Muscle Bulk and Muscle Testing Reveals: Upper Extremities: Full ROM and Muscle strength 5/5 Lumbar Paraspinal Tenderness: L-3- L-4 Lower Extremities:  Right BKA Left Lower Extremity with redness and edema noted Arrived in wheelchair  Neurological: She is alert and oriented to person, place, and time.  Skin: Skin is warm and dry.  Psychiatric: She has a normal mood and affect.  Nursing note and vitals reviewed.         Assessment & Plan:  1.Right below-knee amputation, history of phantom limb pain.: No complaints at this time regarding phantom pain.Continue Current Medication Regime. Continue to Monitor.  2. Peripheral neuropathy: Continue Gabapentin.  Refilled: Fenatnyl Patch 75 mcg one patch every three days #10 and Oxycodone 10/325mg  one tablet every 6 hours as needed #120  3. History of rheumatoid arthritis. Continue Current Medication and Exercise and heat Therapy.  4. Osteoarthritis of left knee: Continue with Voltaren gel/ Exercise and heat therapy.  5. Chronic cellulitis, left leg with associated edema: PCP Following   15 minutes of face to face patient care time was spent during this visit. All questions were encouraged and answered.   F/U in 1 month

## 2014-08-07 ENCOUNTER — Telehealth: Payer: Self-pay | Admitting: *Deleted

## 2014-08-07 NOTE — Telephone Encounter (Signed)
Recd fax approving Cyclobenzaprine/ flexeril member ID [id number] [id number] for one year.

## 2014-08-21 ENCOUNTER — Other Ambulatory Visit: Payer: Self-pay

## 2014-08-21 ENCOUNTER — Ambulatory Visit: Payer: Self-pay

## 2014-08-21 NOTE — Patient Outreach (Addendum)
[facility]) Care Management  08/21/2014  Crystal Fitzpatrick [birth year] [id number]   SUBJECTIVE:  Telephone call to patient for disease management follow up.  Patient states she has not scheduled a physical with her doctor as of yet.  Patient reports over the last month she has had the flu or virus with fever and diarrhea.  Patient states she did not see her doctor regarding these symptoms. Patient states she has been feeling better but still gets somewhat tired during the day.  Patient reports she continues to do her yoga every day and will start incorporating her exercise chair.  Patient states she is not eating chocolate every day anymore.  Patient reports she is not eating chocolate at this time because it bothers her stomach.  Patient she is eating more cooked vegetables and not raw/fresh vegetables due to her intestinal upset.  Patient states she is trying to adhere to a low cholesterol diet.  Patient verbalizes she is mainly vegetarian but will eat fish/salmon on occasion.  Patient reports her leg is much better from the cellulitis.  Patient reports she did receive the EMMI education material for cholesterol and states she has reviewed it some.  Patient reports she continues to use her oxygen at 2L at bedtime.  Patient reports she continues to check her blood sugar at least 1-2 times per week.  States her most recent blood sugar was yesterday 08/20/14 at 90.  Patient reports she continues to see her pain management doctor monthly.  States her blood pressure is checked at the pain management doctors office monthly and it has been stable.   ASSESSMENT:  High cholesterol disease management.  Patient needs additional encouraging to schedule physical appointment with primary MD.    PLAN:  RNCM will follow up with patient within 2 weeks. Patient will call and schedule physical appointment within 2 weeks.  George Ina RN,BSN,CCM [facility] Telephonic Care Coordinator [phone]

## 2014-08-21 NOTE — Patient Instructions (Addendum)
INSTRUCTIONS TO PATIENT:  Contact primary care physician and schedule physical and mammogram. Continue to review EMMI education material regarding cholesterol Continue to exercise consistently Continue to take medications as prescribed by doctor.    High Cholesterol High cholesterol refers to having a high level of cholesterol in your blood. Cholesterol is a white, waxy, fat-like protein that your body needs in small amounts. Your liver makes all the cholesterol you need. Excess cholesterol comes from the food you eat. Cholesterol travels in your bloodstream through your blood vessels. If you have high cholesterol, deposits (plaque) may build up on the walls of your blood vessels. This makes the arteries narrower and stiffer. Plaque increases your risk of heart attack and stroke. Work with your health care provider to keep your cholesterol levels in a healthy range. RISK FACTORS Several things can make you more likely to have high cholesterol. These include:   Eating foods high in animal fat (saturated fat) or cholesterol.  Being overweight.  Not getting enough exercise.  Having a family history of high cholesterol. SIGNS AND SYMPTOMS High cholesterol does not cause symptoms. DIAGNOSIS  Your health care provider can do a blood test to check whether you have high cholesterol. If you are older than 20, your health care provider may check your cholesterol every 4-6 years. You may be checked more often if you already have high cholesterol or other risk factors for heart disease. The blood test for cholesterol measures the following:  Bad cholesterol (LDL cholesterol). This is the type of cholesterol that causes heart disease. This number should be less than 100.  Good cholesterol (HDL cholesterol). This type helps protect against heart disease. A healthy level of HDL cholesterol is 60 or higher.  Total cholesterol. This is the combined number of LDL cholesterol and HDL cholesterol. A  healthy number is less than 200. TREATMENT  High cholesterol can be treated with diet changes, lifestyle changes, and medicine.   Diet changes may include eating more whole grains, fruits, vegetables, nuts, and fish. You may also have to cut back on red meat and foods with a lot of added sugar.  Lifestyle changes may include getting at least 40 minutes of aerobic exercise three times a week. Aerobic exercises include walking, biking, and swimming. Aerobic exercise along with a healthy diet can help you maintain a healthy weight. Lifestyle changes may also include quitting smoking.  If diet and lifestyle changes are not enough to lower your cholesterol, your health care provider may prescribe a statin medicine. This medicine has been shown to lower cholesterol and also lower the risk of heart disease. HOME CARE INSTRUCTIONS  Only take over-the-counter or prescription medicines as directed by your health care provider.   Follow a healthy diet as directed by your health care provider. For instance:   Eat chicken (without skin), fish, veal, shellfish, ground [place] breast, and round or loin cuts of red meat.  Do not eat fried foods and fatty meats, such as hot dogs and salami.   Eat plenty of fruits, such as apples.   Eat plenty of vegetables, such as broccoli, potatoes, and carrots.   Eat beans, peas, and lentils.   Eat grains, such as barley, rice, couscous, and bulgur wheat.   Eat pasta without cream sauces.   Use skim or nonfat milk and low-fat or nonfat yogurt and cheeses. Do not eat or drink whole milk, cream, ice cream, egg yolks, and hard cheeses.   Do not eat stick margarine or tub  margarines that contain trans fats (also called partially hydrogenated oils).   Do not eat cakes, cookies, crackers, or other baked goods that contain trans fats.   Do not eat saturated tropical oils, such as coconut and palm oil.   Exercise as directed by your health care provider.  Increase your activity level with activities such as gardening or walking.   Keep all follow-up appointments.  SEEK MEDICAL CARE IF:  You are struggling to maintain a healthy diet or weight.  You need help starting an exercise program.  You need help to stop smoking. SEEK IMMEDIATE MEDICAL CARE IF:  You have chest pain.  You have trouble breathing. Document Released: 05/04/2005 Document Revised: 09/18/2013 Document Reviewed: 02/24/2013 [facility] Patient Information 2015 [place], [place]. This information is not intended to replace advice given to you by your health care provider. Make sure you discuss any questions you have with your health care provider.

## 2014-08-28 ENCOUNTER — Other Ambulatory Visit: Payer: Self-pay | Admitting: *Deleted

## 2014-08-28 MED ORDER — CYCLOBENZAPRINE HCL 10 MG PO TABS: ORAL_TABLET | ORAL | Status: DC

## 2014-08-28 NOTE — Telephone Encounter (Signed)
Crystal Fitzpatrick called because she had accidentally knocked over her flexeril and spilled them under stove and refrigerator.  She is asking for a refill.  I called pharmacy and last fill was in February and should be no problem.  Electronically refilled. Notified Jola Babinski.

## 2014-08-31 ENCOUNTER — Ambulatory Visit (INDEPENDENT_AMBULATORY_CARE_PROVIDER_SITE_OTHER): Payer: Medicare Other | Admitting: Internal Medicine

## 2014-08-31 ENCOUNTER — Encounter: Payer: Self-pay | Admitting: Internal Medicine

## 2014-08-31 ENCOUNTER — Encounter: Payer: Self-pay | Admitting: Registered Nurse

## 2014-08-31 ENCOUNTER — Other Ambulatory Visit: Payer: Self-pay | Admitting: Registered Nurse

## 2014-08-31 ENCOUNTER — Encounter: Payer: Medicare Other | Attending: Physical Medicine and Rehabilitation | Admitting: Registered Nurse

## 2014-08-31 ENCOUNTER — Telehealth: Payer: Self-pay

## 2014-08-31 VITALS — BP 100/55 | HR 93 | Resp 16

## 2014-08-31 VITALS — BP 110/70 | HR 83 | Temp 98.9°F | Resp 18

## 2014-08-31 DIAGNOSIS — J309 Allergic rhinitis, unspecified: Secondary | ICD-10-CM

## 2014-08-31 DIAGNOSIS — Z79899 Other long term (current) drug therapy: Secondary | ICD-10-CM

## 2014-08-31 DIAGNOSIS — M1990 Unspecified osteoarthritis, unspecified site: Secondary | ICD-10-CM

## 2014-08-31 DIAGNOSIS — M1712 Unilateral primary osteoarthritis, left knee: Secondary | ICD-10-CM

## 2014-08-31 DIAGNOSIS — M179 Osteoarthritis of knee, unspecified: Secondary | ICD-10-CM | POA: Diagnosis not present

## 2014-08-31 DIAGNOSIS — E1159 Type 2 diabetes mellitus with other circulatory complications: Secondary | ICD-10-CM | POA: Diagnosis not present

## 2014-08-31 DIAGNOSIS — G609 Hereditary and idiopathic neuropathy, unspecified: Secondary | ICD-10-CM | POA: Diagnosis not present

## 2014-08-31 DIAGNOSIS — M4716 Other spondylosis with myelopathy, lumbar region: Secondary | ICD-10-CM

## 2014-08-31 DIAGNOSIS — G894 Chronic pain syndrome: Secondary | ICD-10-CM | POA: Diagnosis not present

## 2014-08-31 DIAGNOSIS — M8580 Other specified disorders of bone density and structure, unspecified site: Secondary | ICD-10-CM

## 2014-08-31 DIAGNOSIS — Z1231 Encounter for screening mammogram for malignant neoplasm of breast: Secondary | ICD-10-CM

## 2014-08-31 DIAGNOSIS — L03116 Cellulitis of left lower limb: Secondary | ICD-10-CM | POA: Diagnosis not present

## 2014-08-31 DIAGNOSIS — M069 Rheumatoid arthritis, unspecified: Secondary | ICD-10-CM | POA: Insufficient documentation

## 2014-08-31 DIAGNOSIS — Z89519 Acquired absence of unspecified leg below knee: Secondary | ICD-10-CM

## 2014-08-31 DIAGNOSIS — E785 Hyperlipidemia, unspecified: Secondary | ICD-10-CM

## 2014-08-31 DIAGNOSIS — Z5181 Encounter for therapeutic drug level monitoring: Secondary | ICD-10-CM | POA: Diagnosis not present

## 2014-08-31 MED ORDER — EPINEPHRINE 0.3 MG/0.3ML IJ SOAJ: 0.3000 mg | Freq: Once | INTRAMUSCULAR | Status: DC

## 2014-08-31 MED ORDER — FENTANYL 75 MCG/HR TD PT72: 75.0000 ug | MEDICATED_PATCH | TRANSDERMAL | Status: DC

## 2014-08-31 MED ORDER — TRIAMCINOLONE ACETONIDE 55 MCG/ACT NA AERO: 2.0000 | INHALATION_SPRAY | Freq: Every day | NASAL | Status: AC

## 2014-08-31 MED ORDER — DIPHENOXYLATE-ATROPINE 2.5-0.025 MG PO TABS: ORAL_TABLET | ORAL | Status: DC

## 2014-08-31 MED ORDER — CLINDAMYCIN HCL 300 MG PO CAPS: 300.0000 mg | ORAL_CAPSULE | Freq: Three times a day (TID) | ORAL | Status: DC

## 2014-08-31 MED ORDER — OXYCODONE-ACETAMINOPHEN 10-325 MG PO TABS: 1.0000 | ORAL_TABLET | Freq: Four times a day (QID) | ORAL | Status: DC | PRN

## 2014-08-31 NOTE — Progress Notes (Signed)
Subjective:    Patient ID: Crystal Fitzpatrick, female    DOB: 1945/07/17, 69 y.o.   MRN: [id number]  HPIHere with c/o early recurrent onset LLE red/tender/warmth/swelling x 2 days, without ulceration or drainage, without fever, red streaks.  Does have several wks ongoing nasal allergy symptoms with clearish congestion, itch and sneezing, without fever, pain, ST, cough, swelling or wheezing.   Pt denies polydipsia, polyuria, or low sugar symptoms such as weakness or confusion improved with po intake.  Pt states overall good compliance with meds, trying to follow lower cholesterol, diabetic diet, wt overall stable. Due for mammogram, DXA . Pt denies new neurological symptoms such as new headache, or facial or extremity weakness or numbness   Past Medical History  Diagnosis Date  . DIABETES MELLITUS, TYPE II 07/17/2008  . B12 DEFICIENCY 07/17/2008  . Acute gouty arthropathy 12/11/2008  . HYPONATREMIA 03/20/2010  . HYPERKALEMIA 10/31/2009  . ANEMIA-NOS 07/17/2008  . ANXIETY 07/17/2008  . Chronic pain syndrome 02/14/2010  . Trigeminal neuralgia 02/14/2010  . HEARING LOSS, RIGHT EAR 06/04/2009  . HYPERTENSION 07/17/2008  . CHF 01/28/2009  . Pneumonia, organism unspecified 02/14/2010  . GERD 07/17/2008  . IBS 06/04/2009  . UNSPECIFIED HEPATITIS 01/28/2009  . RENAL INSUFFICIENCY 10/31/2009  . MENOPAUSAL DISORDER 12/03/2009  . Cellulitis and abscess of leg, except foot 11/15/2008  . SKIN ULCER, CHRONIC 02/08/2009  . Rheumatoid arthritis(714.0) 07/17/2008  . SKIN LESION 06/04/2009  . ARTHRITIS 01/28/2009  . FOOT PAIN 07/17/2008  . HYPERSOMNIA 02/14/2010  . Altered mental status 02/07/2010  . PERIPHERAL EDEMA 08/30/2008  . Wheezing 12/03/2009  . ABDOMINAL DISTENSION 10/11/2008  . Dysuria 11/11/2009  . Hypoxemia 03/20/2010  . ANAPHYLACTIC SHOCK 05/15/2009  . NEUROPATHY, HX OF 01/28/2009  . COLONIC POLYPS, HX OF 07/17/2008  . AMPUTATION, BELOW KNEE, RIGHT, HX OF 01/28/2009  . Hyperlipemia 09/24/2010  . Hyperlipidemia 09/24/2010  .  Seizure 10/26/2010  . Pneumonia, aspiration 12/01/2011    Episode July 2013, tx per [facility]   Past Surgical History  Procedure Laterality Date  . Amputation  02/02/2008    below right knee  . S/p egd  2007    with Botox for? Achalasia  . Tonsillectomy    . S/p breast biopsy  1992  . 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 Tubal ligation    . Nasal septum surgery    . Foot surgury  1980 and 1981    reports that she has quit smoking. She has never used smokeless tobacco. She reports that she does not drink alcohol or use illicit drugs. family history includes Breast cancer in her sister; Diabetes in her other; Stroke in her other. Allergies  Allergen Reactions  . Morphine Anaphylaxis  . Sulfonamide Derivatives Diarrhea  . Ciprofloxacin Diarrhea  . Clindamycin/Lincomycin Diarrhea  . Doxycycline     Nausea, diarrhea  . Lyrica [Pregabalin]   . Nitrofurantoin     REACTION: itch  . Prednisone     Fast heart rate  . Erythromycin Rash   Current Outpatient Prescriptions on File Prior to Visit  Medication Sig Dispense Refill  . albuterol (PROAIR HFA) 108 (90 BASE) MCG/ACT inhaler Inhale 2 puffs into the lungs every 6 (six) hours as needed.      . ALPRAZolam (XANAX) 0.25 MG tablet TAKE ONE TABLET 3 TIMES A DAY AS NEEDED 90 tablet 2  . Ascorbic Acid (VITAMIN C) 500 MG tablet Take 500 mg by mouth daily.      [name]  BENICAR 40 MG tablet TAKE 1 TABLET BY MOUTH EVERY DAY FOR BLOOD PRESSURE 30 tablet 5  . Blood Glucose Monitoring Suppl (ONE TOUCH ULTRA 2) W/DEVICE KIT 1 Device by Does not apply route once. 1 each 0  . Blood Glucose Monitoring Suppl (ONE TOUCH ULTRA SYSTEM KIT) W/DEVICE KIT 1 kit by Does not apply route once. 1 each 0  . cyclobenzaprine (FLEXERIL) 10 MG tablet TAKE 1 TABLET BY MOUTH EVERY 8 HOURS AS NEEDED 90 tablet 3  . diclofenac sodium (VOLTAREN) 1 % GEL Apply 2 g topically 4 (four) times daily. 3 Tube 3  . DULoxetine (CYMBALTA) 60 MG capsule TAKE 2  CAPSULES BY MOUTH DAILY 60 capsule 3  . EPINEPHrine (EPIPEN 2-PAK) 0.3 mg/0.3 mL DEVI Inject 0.3 mLs (0.3 mg total) into the muscle once. Use as directed 1 Device 0  . fentaNYL (DURAGESIC - DOSED MCG/HR) 75 MCG/HR Place 1 patch (75 mcg total) onto the skin every 3 (three) days. 1 patch every 3 days 10 patch 0  . furosemide (LASIX) 40 MG tablet TAKE ONE (1) TABLET EACH DAY 90 tablet 3  . gabapentin (NEURONTIN) 600 MG tablet Take 1 tablet (600 mg total) by mouth 3 (three) times daily. 90 tablet 3  . lidocaine (LIDODERM) 5 % Place 1 patch onto the skin daily. Remove & Discard patch within 12 hours or as directed by MD 30 patch 3  . meclizine (ANTIVERT) 25 MG tablet Take 1 tablet (25 mg total) by mouth 3 (three) times daily as needed. 30 tablet 0  . Multiple Vitamins-Minerals (CENTRUM SILVER ULTRA WOMENS PO) Take by mouth daily.      [name] olmesartan (BENICAR) 20 MG tablet Take 20 mg by mouth daily. Patient reports she is no longer taking the 22m benicar tablets. States her doctor changed her dosage to 20 mg tablets.    .Marland Kitchenomega-3 acid ethyl esters (LOVAZA) 1 G capsule Take 1 capsule (1 g total) by mouth daily. 90 capsule 3  . Omega-3 Fatty Acids (FISH OIL) 1000 MG CAPS Take by mouth daily.      .Glory RosebushDELICA LANCETS MISC Use as directed to test blood sugar dx 250.00 100 each 2  . oxyCODONE-acetaminophen (PERCOCET) 10-325 MG per tablet Take 1 tablet by mouth every 6 (six) hours as needed for pain. 120 tablet 0  . pantoprazole (PROTONIX) 40 MG tablet TAKE ONE (1) TABLET BY MOUTH EVERY DAY  FOR ACID REFLUX 30 tablet 11  . promethazine (PHENERGAN) 25 MG tablet Take 1 tablet (25 mg total) by mouth every 6 (six) hours as needed. for nausea 60 tablet 1  . promethazine (PHENERGAN) 25 MG tablet TAKE 1 TABLET BY MOUTH EVERY 6 HOURS AS NEEDED FOR NAUSEA 60 tablet 0  . Psyllium (METAMUCIL) 30.9 % POWD Take by mouth. Take as directed     . topiramate (TOPAMAX) 100 MG tablet TAKE 3 TABLETS BY MOUTH EACH NIGHT AT  BEDTIME 90 tablet 3   No current facility-administered medications on file prior to visit.   Review of Systems  Constitutional: Negative for unusual diaphoresis or night sweats HENT: Negative for ringing in ear or discharge Eyes: Negative for double vision or worsening visual disturbance.  Respiratory: Negative for choking and stridor.   Gastrointestinal: Negative for vomiting or other signifcant bowel change Genitourinary: Negative for hematuria or change in urine volume.  Musculoskeletal: Negative for other MSK pain or swelling Skin: Negative for color change and worsening wound.  Neurological: Negative for tremors and numbness other  than noted  Psychiatric/Behavioral: Negative for decreased concentration or agitation other than above       Objective:   Physical Exam BP 110/70 mmHg  Pulse 83  Temp(Src) 98.9 F (37.2 C) (Oral)  Resp 18  SpO2 95% VS noted,  Constitutional: Pt appears in no significant distress HENT: Head: NCAT.  Right Ear: External ear normal.  Left Ear: External ear normal.  Eyes: . Pupils are equal, round, and reactive to light. Conjunctivae and EOM are normal Bilat tm's with mild erythema.  Max sinus areas non tender.  Pharynx with mild erythema, no exudate Neck: Normal range of motion. Neck supple.  Cardiovascular: Normal rate and regular rhythm.   Pulmonary/Chest: Effort normal and breath sounds without rales or wheezing.  Abd:  Soft, NT, ND, + BS Neurological: Pt is alert. Not confused , motor grossly intact Skin: Skin is warm. No rash, no LE edema Psychiatric: Pt behavior is normal. No agitation.  S/p right BKA LLE with distal anterior area approx 1x2 cm as well as left lateral area 1x1x cm about 2 cm more proximal with redness/warm/mild swelling without ulcer or drainage    Assessment & Plan:

## 2014-08-31 NOTE — Patient Instructions (Signed)
Please take all new medication as prescribed =  The nasal spray  Please take all new medication as prescribed - the antibiotic  Please continue all other medications as before, and refills have been done if requested.  Please have the pharmacy call with any other refills you may need.  Please continue your efforts at being more active, low cholesterol diet, and weight control.  You are otherwise up to date with prevention measures today.  Please keep your appointments with your specialists as you may have planned  Please schedule the bone density test before leaving today at the scheduling desk (where you check out)  You will be contacted regarding the referral for: mammogram  Please go to the LAB in the Basement (turn left off the elevator) for the tests to be done at your convenience  You will be contacted by phone if any changes need to be made immediately.  Otherwise, you will receive a letter about your results with an explanation, but please check with MyChart first.  Please remember to sign up for MyChart if you have not done so, as this will be important to you in the future with finding out test results, communicating by private email, and scheduling acute appointments online when needed.  Please return in 6 months, or sooner if needed

## 2014-08-31 NOTE — Progress Notes (Signed)
Subjective:    Patient ID: Crystal Fitzpatrick, female    DOB: Nov 02, 1945, 69 y.o.   MRN: [id number]  HPI: [name] is a 69 year old female who returns for follow up for chronic pain and medication refill. She says her pain is located in her lower back and left lower extremity. She rates her pain 4. She will resume her  current exercise regime this week, last week she had flu. Will resume her Yoga seven days a week and performing stretching exercises with bands and chair exercises daily. Arrived to clinic in wheelchair.   Pain Inventory Average Pain 8 Pain Right Now 4 My pain is intermittent, sharp, burning and stabbing  In the last 24 hours, has pain interfered with the following? General activity 0 Relation with others 0 Enjoyment of life 0 What TIME of day is your pain at its worst? varies Sleep (in general) NA  Pain is worse with: sitting and inactivity Pain improves with: medication Relief from Meds: did not answer  Mobility use a wheelchair Do you have any goals in this area?  yes wants an electric wheelchair  Function disabled: date disabled . I need assistance with the following:  household duties, shopping and transportation  Neuro/Psych No problems in this area  Prior Studies Any changes since last visit?  no  Physicians involved in your care Any changes since last visit?  no   Family History  Problem Relation Age of Onset  . Breast cancer Sister   . Diabetes Other   . Stroke Other     Grandparents   History   Social History  . Marital Status: Divorced    Spouse Name: N/A  . Number of Children: 1  . Years of Education: N/A   Occupational History  . retired Marketing executive    Social History Main Topics  . Smoking status: Former Games developer  . Smokeless tobacco: Never Used  . Alcohol Use: No  . Drug Use: No  . Sexual Activity: Not on file   Other Topics Concern  . None   Social History Narrative   Past Surgical History    Procedure Laterality Date  . Amputation  02/02/2008    below right knee  . S/p egd  2007    with Botox for? Achalasia  . Tonsillectomy    . S/p breast biopsy  1992  . 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 Tubal ligation    . Nasal septum surgery    . Foot surgury  1980 and 1981   Past Medical History  Diagnosis Date  . DIABETES MELLITUS, TYPE II 07/17/2008  . B12 DEFICIENCY 07/17/2008  . Acute gouty arthropathy 12/11/2008  . HYPONATREMIA 03/20/2010  . HYPERKALEMIA 10/31/2009  . ANEMIA-NOS 07/17/2008  . ANXIETY 07/17/2008  . Chronic pain syndrome 02/14/2010  . Trigeminal neuralgia 02/14/2010  . HEARING LOSS, RIGHT EAR 06/04/2009  . HYPERTENSION 07/17/2008  . CHF 01/28/2009  . Pneumonia, organism unspecified 02/14/2010  . GERD 07/17/2008  . IBS 06/04/2009  . UNSPECIFIED HEPATITIS 01/28/2009  . RENAL INSUFFICIENCY 10/31/2009  . MENOPAUSAL DISORDER 12/03/2009  . Cellulitis and abscess of leg, except foot 11/15/2008  . SKIN ULCER, CHRONIC 02/08/2009  . Rheumatoid arthritis(714.0) 07/17/2008  . SKIN LESION 06/04/2009  . ARTHRITIS 01/28/2009  . FOOT PAIN 07/17/2008  . HYPERSOMNIA 02/14/2010  . Altered mental status 02/07/2010  . PERIPHERAL EDEMA 08/30/2008  . Wheezing 12/03/2009  . ABDOMINAL DISTENSION 10/11/2008  .  Dysuria 11/11/2009  . Hypoxemia 03/20/2010  . ANAPHYLACTIC SHOCK 05/15/2009  . NEUROPATHY, HX OF 01/28/2009  . COLONIC POLYPS, HX OF 07/17/2008  . AMPUTATION, BELOW KNEE, RIGHT, HX OF 01/28/2009  . Hyperlipemia 09/24/2010  . Hyperlipidemia 09/24/2010  . Seizure 10/26/2010  . Pneumonia, aspiration 12/01/2011    Episode July 2013, tx per [facility]   BP 100/55 mmHg  Pulse 93  Resp 16  SpO2 91%  Opioid Risk Score:   Fall Risk Score: Moderate Fall Risk (6-13 points) (previously educated and given handout)`1  Depression screen PHQ 2/9  Depression screen [facility] 2/9 08/03/2014 07/04/2014 12/12/2012  Decreased Interest 0 0 0  Down, Depressed, Hopeless 1 0 1  PHQ - 2 Score 1 0 1   Altered sleeping 2 - -  Tired, decreased energy 0 - -  Change in appetite 1 - -  Feeling bad or failure about yourself  2 - -  Trouble concentrating 0 - -  Moving slowly or fidgety/restless 0 - -  Suicidal thoughts 0 - -  PHQ-9 Score 6 - -    Review of Systems  Gastrointestinal: Positive for diarrhea.  All other systems reviewed and are negative.      Objective:   Physical Exam  Constitutional: She is oriented to person, place, and time. She appears well-developed and well-nourished.  HENT:  Head: Normocephalic and atraumatic.  Neck: Normal range of motion. Neck supple.  Cardiovascular: Normal rate and regular rhythm.   Pulmonary/Chest: Effort normal and breath sounds normal.  Musculoskeletal: She exhibits edema.  Normal Muscle Bulk and Muscle Testing Reveals: Upper Extremities: Full ROM and Muscle Strength 5/5 Back without spinal or paras(pinal tenderness Lower Extremities: Right BKA Left Lower Extremity Full ROM and Muscle Strength 5/5 Arrived in wheelchair  Neurological: She is alert and oriented to person, place, and time.  Skin: Skin is warm and dry. There is erythema.  Left Lower Extremity Reddened and warm to touch  Psychiatric: She has a normal mood and affect.  Nursing note and vitals reviewed.         Assessment & Plan:  1.Right below-knee amputation, history of phantom limb pain.: No complaints at this time regarding phantom pain.Continue Current Medication Regime. Continue to Monitor.  2. Peripheral neuropathy: Continue Gabapentin.  Refilled: Fenatnyl Patch 75 mcg one patch every three days #10 and Oxycodone 10/325mg  one tablet every 6 hours as needed #120  3. History of rheumatoid arthritis. Continue Current Medication and Exercise and heat Therapy.  4. Osteoarthritis of left knee: Continue with Voltaren gel/ Exercise and heat therapy.  5. Chronic cellulitis, left leg with associated edema:  Has an appointment with her PCP Today  15 minutes of  face to face patient care time was spent during this visit. All questions were encouraged and answered.   F/U in 1 month

## 2014-08-31 NOTE — Progress Notes (Signed)
Pre visit review using our clinic review tool, if applicable. No additional management support is needed unless otherwise documented below in the visit note. 

## 2014-09-01 LAB — PMP ALCOHOL METABOLITE (ETG): ETGU: NEGATIVE ng/mL

## 2014-09-02 NOTE — Assessment & Plan Note (Addendum)
stable overall by history and exam, recent data reviewed with pt, and pt to continue medical treatment as before,  to f/u any worsening symptoms or concerns Lab Results  Component Value Date   HGBA1C 6.4 07/25/2013   Pt adamant, declines f/u a1c

## 2014-09-02 NOTE — Assessment & Plan Note (Signed)
Mild uncontrolled, Mild to mod, for nasacort asd,  to f/u any worsening symptoms or concerns

## 2014-09-02 NOTE — Assessment & Plan Note (Addendum)
stable overall by history and exam, recent data reviewed with pt, and pt to continue medical treatment as before,  to f/u any worsening symptoms or concerns Lab Results  Component Value Date   LDLCALC 128* 07/25/2013  declines f/u lab

## 2014-09-02 NOTE — Assessment & Plan Note (Signed)
Mild to mod, for antibx course - cleocin asd,  to f/u any worsening symptoms or concerns 

## 2014-09-03 ENCOUNTER — Ambulatory Visit: Payer: Medicare Other | Admitting: Physical Medicine & Rehabilitation

## 2014-09-04 LAB — OPIATES/OPIOIDS (LC/MS-MS)
Codeine Urine: NEGATIVE ng/mL (ref <50)
HYDROCODONE: NEGATIVE ng/mL (ref <50)
HYDROMORPHONE: NEGATIVE ng/mL (ref <50)
Morphine Urine: NEGATIVE ng/mL (ref <50)
Norhydrocodone, Ur: NEGATIVE ng/mL (ref <50)
Noroxycodone, Ur: >10000 ng/mL (ref <50)
Oxycodone, ur: 3388 ng/mL (ref <50)
Oxymorphone: 5252 ng/mL (ref <50)

## 2014-09-04 LAB — FENTANYL (GC/LC/MS), URINE
FENTANYL (GC/MS) CONFIRM: 151.6 ng/mL — AB (ref <0.5)
Norfentanyl, confirm: 1037 ng/mL — AB (ref <0.5)

## 2014-09-04 LAB — OXYCODONE, URINE (LC/MS-MS)
Noroxycodone, Ur: >10000 ng/mL (ref <50)
Oxycodone, ur: 3388 ng/mL (ref <50)
Oxymorphone: 5252 ng/mL (ref <50)

## 2014-09-05 LAB — PRESCRIPTION MONITORING PROFILE (SOLSTAS)
AMPHETAMINE/METH: NEGATIVE ng/mL
BARBITURATE SCREEN, URINE: NEGATIVE ng/mL
Benzodiazepine Screen, Urine: NEGATIVE ng/mL
Buprenorphine, Urine: NEGATIVE ng/mL
CARISOPRODOL, URINE: NEGATIVE ng/mL
Cannabinoid Scrn, Ur: NEGATIVE ng/mL
Cocaine Metabolites: NEGATIVE ng/mL
Creatinine, Urine: 172.94 mg/dL (ref >20.0)
MDMA URINE: NEGATIVE ng/mL
METHADONE SCREEN, URINE: NEGATIVE ng/mL
Meperidine, Ur: NEGATIVE ng/mL
Nitrites, Initial: NEGATIVE ug/mL
PH URINE, INITIAL: 5.1 pH (ref 4.5–8.9)
Propoxyphene: NEGATIVE ng/mL
Tapentadol, urine: NEGATIVE ng/mL
Tramadol Scrn, Ur: NEGATIVE ng/mL
ZOLPIDEM, URINE: NEGATIVE ng/mL

## 2014-09-05 NOTE — Telephone Encounter (Signed)
error 

## 2014-09-07 ENCOUNTER — Ambulatory Visit: Payer: Medicare Other

## 2014-09-07 ENCOUNTER — Other Ambulatory Visit: Payer: Self-pay

## 2014-09-07 NOTE — Patient Outreach (Signed)
[facility]) Care Management  09/07/2014  Sayaka Hoeppner [birth year] [id number]  Telephone call to patient regarding disease management follow up.  Unable to reach patient or leave message due to voice mail being full.  PLAN:  RNCM will attempt 2nd telephone outreach to patient within 1 week.   George Ina RN,BSN,CCM [facility] Telephonic Care Coordinator [phone]

## 2014-09-11 ENCOUNTER — Other Ambulatory Visit: Payer: Self-pay

## 2014-09-11 ENCOUNTER — Ambulatory Visit: Payer: Self-pay

## 2014-09-11 DIAGNOSIS — E785 Hyperlipidemia, unspecified: Secondary | ICD-10-CM

## 2014-09-13 NOTE — Addendum Note (Signed)
Addended by: George Ina E on: 09/13/2014 10:00 AM   Modules accepted: Orders

## 2014-09-13 NOTE — Patient Outreach (Addendum)
[facility]) Care Management  09/13/2014  Maila Dukes [birth year] [id number]  SUBJECTIVE; Telephone call to patient regarding disease management follow up.  Patient states she saw her primary care provider 08/31/14.  States she spoke with her primary MD and asked about scheduling a physical.  Patient states her primary MD stated a physical would not be covered under Medicare.  Patient states she was seeing her primary MD for a sick visit.  Patient states she has cellulitis again in her left leg and was started on antibiotics.  Patient states since she has been taking the antibiotics her cellulitis is clearing up and the diarrhea she was having  Has stopped.  Patient states she is suppose to follow up with her primary MD after completion of her antibiotic in approximately 1 to 1 1/2 week.  Patient denies any pain in her leg at this time.  Patient states she will be having follow up lab work at her next primary MD visit for follow up of her cholesterol and will also schedule mammogram.  Patient states she will agency that provided her leg prosthesis for a follow up appointment due to prosthesis not fitting. Patient reports taking all of her medications as prescribed by her doctor. Patient reports she continues to exercised 5 days a week doing yoga and using her exercise chair.  Patient states she is adhering to her low fat diet.  Patient states she is mainly vegetarian.   Patient reports she has followed up with her pain doctor.  States her pain is mainly in her muscles and skin.  Patient reports pain level of 5 today.  Patient reports she has been doing well with her pain medication. States she does not have to take as often.  Patient reports she turns in approximately 39-45 pain tablets at her follow up pain clinic appointments.     ASSESSMENT:  Disease management follow up for hyperlipidemia  PLAN:  RNCM will refer patient to health coach for  Disease management follow up.  Patient  will schedule follow up appointment to be seen by primary MD after completion of antibiotics. Patient will report having lab work done by next telephone outreach.   George Ina RN,BSN,CCM [facility] Telephonic Care Coordinator [phone]

## 2014-09-14 ENCOUNTER — Other Ambulatory Visit: Payer: Self-pay | Admitting: Internal Medicine

## 2014-09-14 NOTE — Progress Notes (Signed)
Urine drug screen for this encounter is consistent for prescribed medication 

## 2014-09-18 ENCOUNTER — Other Ambulatory Visit: Payer: Self-pay

## 2014-09-18 NOTE — Patient Outreach (Signed)
[facility]) Care Management  09/18/2014  Mabelle Mungin [birth year] [id number]   Telephone call to patient to introduce self to patient.  Patient states she is doing ok. No complaints verbalized.  Explained health coach role to patient.  She verbalized understanding and looks forward to subsequent calls.    Plan: RNCM will reach out to patient within 30 days.   Bary Leriche, RN, MSN [facility] Care Management [phone]

## 2014-09-20 ENCOUNTER — Other Ambulatory Visit: Payer: Medicare Other

## 2014-09-20 ENCOUNTER — Ambulatory Visit: Payer: Medicare Other

## 2014-09-27 ENCOUNTER — Encounter: Payer: Medicare Other | Attending: Physical Medicine and Rehabilitation | Admitting: Registered Nurse

## 2014-09-27 ENCOUNTER — Ambulatory Visit: Payer: Medicare Other

## 2014-09-27 ENCOUNTER — Encounter: Payer: Self-pay | Admitting: Registered Nurse

## 2014-09-27 ENCOUNTER — Ambulatory Visit: Admission: RE | Admit: 2014-09-27 | Payer: Medicare Other | Source: Ambulatory Visit

## 2014-09-27 VITALS — BP 115/66 | HR 72 | Resp 14

## 2014-09-27 DIAGNOSIS — G609 Hereditary and idiopathic neuropathy, unspecified: Secondary | ICD-10-CM

## 2014-09-27 DIAGNOSIS — M4716 Other spondylosis with myelopathy, lumbar region: Secondary | ICD-10-CM | POA: Diagnosis not present

## 2014-09-27 DIAGNOSIS — G894 Chronic pain syndrome: Secondary | ICD-10-CM

## 2014-09-27 DIAGNOSIS — Z79899 Other long term (current) drug therapy: Secondary | ICD-10-CM

## 2014-09-27 DIAGNOSIS — Z89519 Acquired absence of unspecified leg below knee: Secondary | ICD-10-CM | POA: Diagnosis not present

## 2014-09-27 DIAGNOSIS — M069 Rheumatoid arthritis, unspecified: Secondary | ICD-10-CM | POA: Diagnosis not present

## 2014-09-27 DIAGNOSIS — M179 Osteoarthritis of knee, unspecified: Secondary | ICD-10-CM | POA: Insufficient documentation

## 2014-09-27 DIAGNOSIS — M1712 Unilateral primary osteoarthritis, left knee: Secondary | ICD-10-CM

## 2014-09-27 DIAGNOSIS — Z5181 Encounter for therapeutic drug level monitoring: Secondary | ICD-10-CM

## 2014-09-27 MED ORDER — FENTANYL 75 MCG/HR TD PT72: 75.0000 ug | MEDICATED_PATCH | TRANSDERMAL | Status: DC

## 2014-09-27 MED ORDER — OXYCODONE-ACETAMINOPHEN 10-325 MG PO TABS: 1.0000 | ORAL_TABLET | Freq: Four times a day (QID) | ORAL | Status: DC | PRN

## 2014-09-27 MED ORDER — GABAPENTIN 100 MG PO CAPS: 100.0000 mg | ORAL_CAPSULE | Freq: Three times a day (TID) | ORAL | Status: DC

## 2014-09-27 NOTE — Progress Notes (Signed)
Subjective:    Patient ID: Crystal Fitzpatrick, female    DOB: 01/08/1946, 69 y.o.   MRN: [id number]  HPI: [name] is a 69 year old female who returns for follow up for chronic pain and medication refill. She says her pain is located in her neck, lower back, right stump and  left lower extremity. She rates her pain 6.Her  current exercise regime is yoga seven days a week and performing stretching exercises with bands and chair exercises daily. Arrived to clinic in wheelchair.  Pain Inventory Average Pain 8 Pain Right Now 6 My pain is intermittent, constant, sharp, burning, dull, stabbing and tingling  In the last 24 hours, has pain interfered with the following? General activity 8 Relation with others 5 Enjoyment of life 6 What TIME of day is your pain at its worst? morning, night Sleep (in general) varies between poor and fair  Pain is worse with: sitting and inactivity Pain improves with: medication Relief from Meds: 7  Mobility use a wheelchair transfers alone  Function disabled: date disabled .  Neuro/Psych No problems in this area  Prior Studies Any changes since last visit?  no  Physicians involved in your care Any changes since last visit?  no   Family History  Problem Relation Age of Onset  . Breast cancer Sister   . Diabetes Other   . Stroke Other     Grandparents   History   Social History  . Marital Status: Divorced    Spouse Name: N/A  . Number of Children: 1  . Years of Education: N/A   Occupational History  . retired Marketing executive    Social History Main Topics  . Smoking status: Former Games developer  . Smokeless tobacco: Never Used  . Alcohol Use: No  . Drug Use: No  . Sexual Activity: Not on file   Other Topics Concern  . None   Social History Narrative   Past Surgical History  Procedure Laterality Date  . Amputation  02/02/2008    below right knee  . S/p egd  2007    with Botox for? Achalasia  . Tonsillectomy     . S/p breast biopsy  1992  . 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 Tubal ligation    . Nasal septum surgery    . Foot surgury  1980 and 1981   Past Medical History  Diagnosis Date  . DIABETES MELLITUS, TYPE II 07/17/2008  . B12 DEFICIENCY 07/17/2008  . Acute gouty arthropathy 12/11/2008  . HYPONATREMIA 03/20/2010  . HYPERKALEMIA 10/31/2009  . ANEMIA-NOS 07/17/2008  . ANXIETY 07/17/2008  . Chronic pain syndrome 02/14/2010  . Trigeminal neuralgia 02/14/2010  . HEARING LOSS, RIGHT EAR 06/04/2009  . HYPERTENSION 07/17/2008  . CHF 01/28/2009  . Pneumonia, organism unspecified 02/14/2010  . GERD 07/17/2008  . IBS 06/04/2009  . UNSPECIFIED HEPATITIS 01/28/2009  . RENAL INSUFFICIENCY 10/31/2009  . MENOPAUSAL DISORDER 12/03/2009  . Cellulitis and abscess of leg, except foot 11/15/2008  . SKIN ULCER, CHRONIC 02/08/2009  . Rheumatoid arthritis(714.0) 07/17/2008  . SKIN LESION 06/04/2009  . ARTHRITIS 01/28/2009  . FOOT PAIN 07/17/2008  . HYPERSOMNIA 02/14/2010  . Altered mental status 02/07/2010  . PERIPHERAL EDEMA 08/30/2008  . Wheezing 12/03/2009  . ABDOMINAL DISTENSION 10/11/2008  . Dysuria 11/11/2009  . Hypoxemia 03/20/2010  . ANAPHYLACTIC SHOCK 05/15/2009  . NEUROPATHY, HX OF 01/28/2009  . COLONIC POLYPS, HX OF 07/17/2008  . AMPUTATION, BELOW KNEE,  RIGHT, HX OF 01/28/2009  . Hyperlipemia 09/24/2010  . Hyperlipidemia 09/24/2010  . Seizure 10/26/2010  . Pneumonia, aspiration 12/01/2011    Episode July 2013, tx per [facility]   BP 115/66 mmHg  Pulse 72  Resp 14  SpO2 97%  Opioid Risk Score:   Fall Risk Score: Moderate Fall Risk (6-13 points)`1  Depression screen PHQ 2/9  Depression screen [facility] 2/9 09/27/2014 08/03/2014 07/04/2014 12/12/2012  Decreased Interest 0 0 0 0  Down, Depressed, Hopeless 0 1 0 1  PHQ - 2 Score 0 1 0 1  Altered sleeping 1 2 - -  Tired, decreased energy 0 0 - -  Change in appetite 1 1 - -  Feeling bad or failure about yourself  1 2 - -  Trouble concentrating 0 0 -  -  Moving slowly or fidgety/restless 0 0 - -  Suicidal thoughts 0 0 - -  PHQ-9 Score 3 6 - -     Review of Systems  All other systems reviewed and are negative.      Objective:   Physical Exam  Constitutional: She is oriented to person, place, and time. She appears well-developed and well-nourished.  HENT:  Head: Normocephalic and atraumatic.  Neck: Normal range of motion. Neck supple.  Cardiovascular: Normal rate and regular rhythm.   Pulmonary/Chest: Effort normal and breath sounds normal.  Musculoskeletal:  Normal Muscle Bulk and Muscle Testing Reveals: Upper Extremities: Full ROM and Muscle Strength 5/5 Thoracic Paraspinal Tenderness: T-1- T-4 Lower Extremities: Right: BKA Left: Lower Extremity: Full ROM and Muscle Strength 5/5 Arrives in wheelchair   Neurological: She is alert and oriented to person, place, and time.  Skin: Skin is warm and dry.  Left Lower Extremity: Reddened  Psychiatric: She has a normal mood and affect.  Nursing note and vitals reviewed.         Assessment & Plan:  1.Right below-knee amputation, history of phantom limb pain.: No complaints at this time regarding phantom pain.Continue Current Medication Regime. Continue to Monitor.  2. Peripheral neuropathy: Continue Gabapentin.  Refilled: Fenatnyl Patch 75 mcg one patch every three days #10 and Oxycodone 10/325mg  one tablet every 6 hours as needed #120  3. History of rheumatoid arthritis. Continue Current Medication and Exercise and heat Therapy.  4. Osteoarthritis of left knee: Continue with Voltaren gel/ Exercise and heat therapy.  5. Chronic cellulitis, left leg with associated edema: Has an appointment with her PCP Today  15 minutes of face to face patient care time was spent during this visit. All questions were encouraged and answered.   F/U in 1 month

## 2014-10-05 ENCOUNTER — Other Ambulatory Visit: Payer: Self-pay | Admitting: *Deleted

## 2014-10-05 MED ORDER — DULOXETINE HCL 60 MG PO CPEP: ORAL_CAPSULE | ORAL | Status: DC

## 2014-10-08 ENCOUNTER — Other Ambulatory Visit: Payer: Self-pay

## 2014-10-08 ENCOUNTER — Ambulatory Visit: Payer: Medicare Other

## 2014-10-08 NOTE — Patient Outreach (Signed)
[facility]) Care Management  10/08/2014  Crystal Fitzpatrick Aug 08, 1945 [id number]  Telephone call to patient for monthly outreach call.  No answer.  HIPAA compliant voice message left with health coach contact information. Will plan to outreach again within one week.    Bary Leriche, RN, MSN [facility] Care Management RN Telephonic Health Coach [phone]

## 2014-10-11 ENCOUNTER — Ambulatory Visit: Payer: Medicare Other

## 2014-10-11 ENCOUNTER — Inpatient Hospital Stay: Admission: RE | Admit: 2014-10-11 | Payer: Medicare Other | Source: Ambulatory Visit

## 2014-10-12 ENCOUNTER — Other Ambulatory Visit: Payer: Self-pay

## 2014-10-12 ENCOUNTER — Ambulatory Visit: Payer: Self-pay

## 2014-10-12 NOTE — Patient Outreach (Signed)
[facility]) Care Management  10/12/2014  Crystal Fitzpatrick [birth year] [id number]  Telephone call to patient. She asked if I would call later this afternoon.  Advised Health Coach would call later.  Patient agrees with later call.  Bary Leriche, RN, MSN [facility] Care Management RN Telephonic Health Coach [phone]

## 2014-10-12 NOTE — Patient Outreach (Signed)
[facility]) Care Management  10/12/2014  Crystal Fitzpatrick 1945/12/01 [id number]   Spoke with patient she states she has a bad headache and will not talk long.  Patient states she has a sinus headache from her allergies today.  She states she is going to take her antihistamine and her sprays and try to rest.  Health Coach advised patient to also take some pain reliever.  She verbalized understanding. Advised patient that I would attempt to reach out next week.   Patient in agreement.    Plan: RN Health Coach will contact patient within one week.  Bary Leriche, RN, MSN [facility] Care Management RN Telephonic Health Coach [phone]

## 2014-10-19 ENCOUNTER — Other Ambulatory Visit: Payer: Self-pay

## 2014-10-19 ENCOUNTER — Ambulatory Visit: Payer: Medicare Other

## 2014-10-19 NOTE — Patient Outreach (Signed)
[facility]) Care Management  10/19/2014  Crystal Fitzpatrick [birth year] [id number]   Telephone call to patient for disease management follow up.  Unable to reach.  HIPPA compliant voice message left.   Plan: RN Health Coach will attempt telephone outreach within 1 week.     Bary Leriche, RN, MSN [facility] Care Management RN Telephonic Health Coach [phone]

## 2014-10-26 ENCOUNTER — Other Ambulatory Visit: Payer: Self-pay

## 2014-10-26 NOTE — Patient Outreach (Signed)
[facility]) Care Management  [place]  10/26/2014   Crystal Fitzpatrick [birth year] [id number]   Subjective: Spoke with patient she reports she is doing good.  Patient reports she went to primary care office last month for pain medications.  No labs done on last visit. Patient reports having a mammogram scheduled for 11-07-14.  Patient to see pain doctor on 10-30-14 and will talk with him about orthopedic doctor as orthopedic doctor has moved. Patient having some issues with her prosthesis and needs to see orthopedic.  Patient rates body pain at 6-7/10 today but needs to change pain patch today. Patient reports she tries to stick to low fat low cholesterol diet but states she cut her chocolate but eats an ice cream sandwich daily.  Patient reports she does exercise but has not been able to exercise daily. Patient reports she needs to make appointment to see her primary doctor for a routine check-up.      Current Medications:  Current Outpatient Prescriptions  Medication Sig Dispense Refill  . albuterol (PROAIR HFA) 108 (90 BASE) MCG/ACT inhaler Inhale 2 puffs into the lungs every 6 (six) hours as needed.      . ALPRAZolam (XANAX) 0.25 MG tablet TAKE ONE TABLET 3 TIMES A DAY AS NEEDED 90 tablet 2  . Ascorbic Acid (VITAMIN C) 500 MG tablet Take 500 mg by mouth daily.      [name] BENICAR 40 MG tablet TAKE 1 TABLET BY MOUTH EVERY DAY FOR BLOOD PRESSURE 30 tablet 5  . Blood Glucose Monitoring Suppl (ONE TOUCH ULTRA 2) W/DEVICE KIT 1 Device by Does not apply route once. 1 each 0  . Blood Glucose Monitoring Suppl (ONE TOUCH ULTRA SYSTEM KIT) W/DEVICE KIT 1 kit by Does not apply route once. 1 each 0  . cyclobenzaprine (FLEXERIL) 10 MG tablet TAKE 1 TABLET BY MOUTH EVERY 8 HOURS AS NEEDED 90 tablet 3  . diclofenac sodium (VOLTAREN) 1 % GEL Apply 2 g topically 4 (four) times daily. 3 Tube 3  . diphenoxylate-atropine (LOMOTIL) 2.5-0.025 MG per tablet TAKE 1 TABLET BY MOUTH 2 TIMES A DAY AS  NEEDED 60 tablet 1  . DULoxetine (CYMBALTA) 60 MG capsule TAKE 2 CAPSULES BY MOUTH DAILY 60 capsule 3  . EPINEPHrine (EPIPEN 2-PAK) 0.3 mg/0.3 mL DEVI Inject 0.3 mLs (0.3 mg total) into the muscle once. Use as directed 1 Device 0  . EPINEPHrine 0.3 mg/0.3 mL IJ SOAJ injection Inject 0.3 mLs (0.3 mg total) into the muscle once. 1 Device 2  . fentaNYL (DURAGESIC - DOSED MCG/HR) 75 MCG/HR Place 1 patch (75 mcg total) onto the skin every 3 (three) days. 1 patch every 3 days 10 patch 0  . furosemide (LASIX) 40 MG tablet TAKE ONE (1) TABLET EACH DAY 90 tablet 3  . gabapentin (NEURONTIN) 600 MG tablet Take 1 tablet (600 mg total) by mouth 3 (three) times daily. 90 tablet 3  . Multiple Vitamins-Minerals (CENTRUM SILVER ULTRA WOMENS PO) Take by mouth daily.      [name] olmesartan (BENICAR) 20 MG tablet Take 20 mg by mouth daily. Patient reports she is no longer taking the 41m benicar tablets. States her doctor changed her dosage to 20 mg tablets.    .Marland Kitchenomega-3 acid ethyl esters (LOVAZA) 1 G capsule Take 1 capsule (1 g total) by mouth daily. 90 capsule 3  . ONETOUCH DELICA LANCETS MISC Use as directed to test blood sugar dx 250.00 100 each 2  . oxyCODONE-acetaminophen (PERCOCET) 10-325 MG  per tablet Take 1 tablet by mouth every 6 (six) hours as needed for pain. 120 tablet 0  . pantoprazole (PROTONIX) 40 MG tablet TAKE ONE (1) TABLET BY MOUTH EVERY DAY  FOR ACID REFLUX 30 tablet 11  . promethazine (PHENERGAN) 25 MG tablet TAKE 1 TABLET BY MOUTH EVERY 6 HOURS AS NEEDED FOR NAUSEA 60 tablet 1  . Psyllium (METAMUCIL) 30.9 % POWD Take by mouth. Take as directed     . topiramate (TOPAMAX) 100 MG tablet TAKE 3 TABLETS BY MOUTH EACH NIGHT AT BEDTIME 90 tablet 3  . triamcinolone (NASACORT AQ) 55 MCG/ACT AERO nasal inhaler Place 2 sprays into the nose daily. 1 Inhaler 12  . clindamycin (CLEOCIN) 300 MG capsule Take 1 capsule (300 mg total) by mouth 3 (three) times daily. (Patient not taking: Reported on 10/26/2014) 30  capsule 0  . meclizine (ANTIVERT) 25 MG tablet Take 1 tablet (25 mg total) by mouth 3 (three) times daily as needed. (Patient not taking: Reported on 10/26/2014) 30 tablet 0  . Omega-3 Fatty Acids (FISH OIL) 1000 MG CAPS Take by mouth daily.      . promethazine (PHENERGAN) 25 MG tablet Take 1 tablet (25 mg total) by mouth every 6 (six) hours as needed. for nausea (Patient not taking: Reported on 10/26/2014) 60 tablet 1  . promethazine (PHENERGAN) 25 MG tablet TAKE 1 TABLET BY MOUTH EVERY 6 HOURS AS NEEDED FOR NAUSEA (Patient not taking: Reported on 10/26/2014) 60 tablet 0   No current facility-administered medications for this visit.    Functional Status:  In your present state of health, do you have any difficulty performing the following activities: 10/26/2014  Hearing? N  Vision? Y  Difficulty concentrating or making decisions? N  Walking or climbing stairs? Y  Dressing or bathing? N  Doing errands, shopping? Y  Preparing Food and eating ? N  Using the Toilet? N  In the past six months, have you accidently leaked urine? N  Do you have problems with loss of bowel control? N  Managing your Medications? N  Managing your Finances? N  Housekeeping or managing your Housekeeping? Y    Fall/Depression Screening: PHQ 2/9 Scores 10/26/2014 09/27/2014 08/03/2014 07/04/2014 12/12/2012  PHQ - 2 Score 0 0 1 0 1  PHQ- 9 Score - 3 6 - -   THN CM Care Plan Problem One        Patient Outreach Telephone from 10/26/2014 in [place] Problem One  high cholesterol   Care Plan for Problem One  Active   THN Long Term Goal (31-90 days)  Patients total cholesterol level will be at goal,(less than 212m/dl )within 90 days.   THN Long Term Goal Start Date  10/26/14   Interventions for Problem One Long Term Goal  RNCM discussed with patient importance of following a low fat/low cholesterol diet and exercising daily,     THN CM Short Term Goal #2 (0-30 days)  Patient will report she is  exercisiong at least 5 days per week within 30 days.    THN CM Short Term Goal #2 Start Date  10/26/14   THN CM Short Term Goal #2 Met Date  -- [goal restarted]   Interventions for Short Term Goal #2  Discussed with patient importance of exercise and controlling cholesterol.  Discussed how exercise helps to maintain muscle mass.    THN CM Short Term Goal #4 (0-30 days)  Patient will report adhering to low fat/low cholesterol  diet within the next 30 days   THN CM Short Term Goal #4 Start Date  10/26/14   Interventions for Short Term Goal #4  Discussed with patient attempt to increase intake of more fruits and vegetables.  Discussed foods high in fat content.      [place] Problem Two        Patient Outreach Telephone from 10/26/2014 in [place] for Problem Two  Not Active   THN CM Short Term Goal #2 (0-30 days)  patient will take and complete clindamycin antibiotic as instructed by her doctor within 30 days   [facility] CM Short Term Goal #2 Met Date  10/26/14    [facility] CM Care Plan Problem Three        Patient Outreach Telephone from 10/26/2014 in [place] for Problem Three  Not Active   THN CM Short Term Goal #1 Start Date  -- [goal resarted]      Assessment: Patient continues to needs education reinforcement on low fat low cholesterol diet.     Plan: RN Health Coach will outreach to patient within the next month education and support for controlling cholesterol and patient agrees to next outreach.  RN Health Coach will send note as update to primary care physician.      Jone Baseman, RN, MSN [place] [phone]

## 2014-10-30 ENCOUNTER — Encounter: Payer: Medicare Other | Admitting: Physical Medicine & Rehabilitation

## 2014-11-06 ENCOUNTER — Other Ambulatory Visit: Payer: Self-pay | Admitting: Internal Medicine

## 2014-11-07 ENCOUNTER — Ambulatory Visit
Admission: RE | Admit: 2014-11-07 | Discharge: 2014-11-07 | Disposition: A | Discharge status: Home / Self Care | Payer: Medicare Other | Source: Ambulatory Visit | Attending: Internal Medicine | Admitting: Internal Medicine

## 2014-11-07 DIAGNOSIS — M8580 Other specified disorders of bone density and structure, unspecified site: Secondary | ICD-10-CM

## 2014-11-07 DIAGNOSIS — Z1231 Encounter for screening mammogram for malignant neoplasm of breast: Secondary | ICD-10-CM

## 2014-11-07 DIAGNOSIS — M810 Age-related osteoporosis without current pathological fracture: Secondary | ICD-10-CM | POA: Diagnosis not present

## 2014-11-07 NOTE — Telephone Encounter (Signed)
rx faxed to pharmacy

## 2014-11-09 ENCOUNTER — Other Ambulatory Visit: Payer: Self-pay | Admitting: Internal Medicine

## 2014-11-09 ENCOUNTER — Telehealth: Payer: Self-pay | Admitting: Internal Medicine

## 2014-11-09 MED ORDER — ALENDRONATE SODIUM 70 MG PO TABS: 70.0000 mg | ORAL_TABLET | ORAL | Status: DC

## 2014-11-09 MED ORDER — TOPIRAMATE 100 MG PO TABS: ORAL_TABLET | ORAL | Status: DC

## 2014-11-09 NOTE — Telephone Encounter (Signed)
Patient is requesting refill on topamax to be sent to [facility].   Patient is also requesting results of her DEXA and Mammogram.   Patient states she will be out of med on Sunday and she does not want to worry about her results over the weekend.  If someone could please give her a call.

## 2014-11-09 NOTE — Telephone Encounter (Signed)
Pt aware of results, rx sent in electronically

## 2014-11-09 NOTE — Telephone Encounter (Signed)
Left message for pt to call back  °

## 2014-11-09 NOTE — Telephone Encounter (Signed)
Patient called back

## 2014-11-12 ENCOUNTER — Other Ambulatory Visit: Payer: Self-pay | Admitting: Internal Medicine

## 2014-11-12 ENCOUNTER — Other Ambulatory Visit: Payer: Self-pay

## 2014-11-14 NOTE — Telephone Encounter (Signed)
Rx faxed to pharmacy  

## 2014-11-14 NOTE — Telephone Encounter (Signed)
Done hardcopy to Dahlia  

## 2014-11-16 ENCOUNTER — Encounter: Payer: Self-pay | Admitting: Internal Medicine

## 2014-11-16 ENCOUNTER — Telehealth: Payer: Self-pay | Admitting: *Deleted

## 2014-11-16 DIAGNOSIS — M810 Age-related osteoporosis without current pathological fracture: Secondary | ICD-10-CM | POA: Insufficient documentation

## 2014-11-16 HISTORY — DX: Age-related osteoporosis without current pathological fracture: M81.0

## 2014-11-16 NOTE — Telephone Encounter (Signed)
[name] is going to run out of fentanyl patches and percocet on July 8th. We have no appts available for you or Crystal Fitzpatrick before then. Please advise

## 2014-11-20 ENCOUNTER — Other Ambulatory Visit: Payer: Self-pay

## 2014-11-20 NOTE — Patient Outreach (Addendum)
Crystal Fitzpatrick) Care Management  [name]  11/20/2014   Crystal Fitzpatrick [birth year] [id number]  Telephone call to patient after patient called in.  Patient reports she is doing ok but has some questions about her bone density.  Discussed bone density and medication.  She verbalized understanding.  Patient not exercising right now due to bone density findings. Advised patient to discuss her Yoga with Dr. Jenny Reichmann on next visit.  She verbalized understanding.  Patient reports giving up chocolate recently to help with her cholesterol. Discussed with patient low fat diet.  She verbalized understanding. No concerns.   Current Medications:  Current Outpatient Prescriptions  Medication Sig Dispense Refill  . albuterol (PROAIR HFA) 108 (90 BASE) MCG/ACT inhaler Inhale 2 puffs into the lungs every 6 (six) hours as needed.      [name] alendronate (FOSAMAX) 70 MG tablet Take 1 tablet (70 mg total) by mouth every 7 (seven) days. Take with a full glass of water on an empty stomach. 12 tablet 3  . ALPRAZolam (XANAX) 0.25 MG tablet TAKE ONE (1) TABLET THREE (3) TIMES EACHDAY AS NEEDED 90 tablet 2  . Ascorbic Acid (VITAMIN C) 500 MG tablet Take 500 mg by mouth daily.      [name] BENICAR 40 MG tablet TAKE 1 TABLET BY MOUTH EVERY DAY FOR BLOOD PRESSURE 30 tablet 5  . Blood Glucose Monitoring Suppl (ONE TOUCH ULTRA 2) W/DEVICE KIT 1 Device by Does not apply route once. 1 each 0  . Blood Glucose Monitoring Suppl (ONE TOUCH ULTRA SYSTEM KIT) W/DEVICE KIT 1 kit by Does not apply route once. 1 each 0  . cyclobenzaprine (FLEXERIL) 10 MG tablet TAKE 1 TABLET BY MOUTH EVERY 8 HOURS AS NEEDED 90 tablet 3  . diclofenac sodium (VOLTAREN) 1 % GEL Apply 2 g topically 4 (four) times daily. 3 Tube 3  . diphenoxylate-atropine (LOMOTIL) 2.5-0.025 MG per tablet TAKE 1 TABLET BY MOUTH 2 TIMES A DAY AS NEEDED 60 tablet 1  . DULoxetine (CYMBALTA) 60 MG capsule TAKE 2 CAPSULES BY MOUTH DAILY 60 capsule 3  . EPINEPHrine (EPIPEN  2-PAK) 0.3 mg/0.3 mL DEVI Inject 0.3 mLs (0.3 mg total) into the muscle once. Use as directed 1 Device 0  . EPINEPHrine 0.3 mg/0.3 mL IJ SOAJ injection Inject 0.3 mLs (0.3 mg total) into the muscle once. 1 Device 2  . fentaNYL (DURAGESIC - DOSED MCG/HR) 75 MCG/HR Place 1 patch (75 mcg total) onto the skin every 3 (three) days. 1 patch every 3 days 10 patch 0  . furosemide (LASIX) 40 MG tablet TAKE ONE (1) TABLET EACH DAY 90 tablet 3  . gabapentin (NEURONTIN) 600 MG tablet Take 1 tablet (600 mg total) by mouth 3 (three) times daily. 90 tablet 3  . Multiple Vitamins-Minerals (CENTRUM SILVER ULTRA WOMENS PO) Take by mouth daily.      [name] olmesartan (BENICAR) 20 MG tablet Take 20 mg by mouth daily. Patient reports she is no longer taking the 66m benicar tablets. States her doctor changed her dosage to 20 mg tablets.    .Marland Kitchenomega-3 acid ethyl esters (LOVAZA) 1 G capsule Take 1 capsule (1 g total) by mouth daily. 90 capsule 3  . ONETOUCH DELICA LANCETS MISC Use as directed to test blood sugar dx 250.00 100 each 2  . oxyCODONE-acetaminophen (PERCOCET) 10-325 MG per tablet Take 1 tablet by mouth every 6 (six) hours as needed for pain. 120 tablet 0  . pantoprazole (PROTONIX) 40 MG tablet TAKE ONE (1)  TABLET BY MOUTH EVERY DAY  FOR ACID REFLUX 30 tablet 11  . promethazine (PHENERGAN) 25 MG tablet Take 1 tablet (25 mg total) by mouth every 6 (six) hours as needed. for nausea 60 tablet 1  . promethazine (PHENERGAN) 25 MG tablet TAKE 1 TABLET BY MOUTH EVERY 6 HOURS AS NEEDED FOR NAUSEA 60 tablet 0  . Psyllium (METAMUCIL) 30.9 % POWD Take by mouth. Take as directed     . topiramate (TOPAMAX) 100 MG tablet TAKE 3 TABLETS BY MOUTH EACH NIGHT AT BEDTIME 90 tablet 1  . triamcinolone (NASACORT AQ) 55 MCG/ACT AERO nasal inhaler Place 2 sprays into the nose daily. 1 Inhaler 12  . clindamycin (CLEOCIN) 300 MG capsule Take 1 capsule (300 mg total) by mouth 3 (three) times daily. (Patient not taking: Reported on 10/26/2014) 30  capsule 0  . meclizine (ANTIVERT) 25 MG tablet Take 1 tablet (25 mg total) by mouth 3 (three) times daily as needed. (Patient not taking: Reported on 10/26/2014) 30 tablet 0  . Omega-3 Fatty Acids (FISH OIL) 1000 MG CAPS Take by mouth daily.      . promethazine (PHENERGAN) 25 MG tablet TAKE 1 TABLET BY MOUTH EVERY 6 HOURS AS NEEDED FOR NAUSEA (Patient not taking: Reported on 11/20/2014) 60 tablet 1   No current facility-administered medications for this visit.    Functional Status:  In your present state of health, do you have any difficulty performing the following activities: 11/20/2014 10/26/2014  Hearing? N N  Vision? Y Y  Difficulty concentrating or making decisions? N N  Walking or climbing stairs? Y Y  Dressing or bathing? N N  Doing errands, shopping? Crystal Fitzpatrick  Preparing Food and eating ? N N  Using the Toilet? N N  In the past six months, have you accidently leaked urine? N N  Do you have problems with loss of bowel control? N N  Managing your Medications? N N  Managing your Finances? N N  Housekeeping or managing your Housekeeping? Crystal Fitzpatrick    Fall/Depression Screening: PHQ 2/9 Scores 11/20/2014 10/26/2014 09/27/2014 08/03/2014 07/04/2014 12/12/2012  PHQ - 2 Score 0 0 0 1 0 1  PHQ- 9 Score - - 3 6 - -   THN CM Care Plan Problem One        Patient Outreach Telephone from 11/20/2014 in [place] Problem One  high cholesterol   Care Plan for Problem One  Active   THN Long Term Goal (31-90 days)  Patients total cholesterol level will be at goal,(less than 213m/dl )within 90 days.   THN Long Term Goal Start Date  10/26/14   Interventions for Problem One Long Term Goal  RNHealth Coach discussed with patient importance of following a low fat/low cholesterol diet and exercising daily,     THN CM Short Term Goal #2 (0-30 days)  Patient will report she is exercisiong at least 5 days per week within 30 days.    THN CM Short Term Goal #2 Start Date  11/20/14   THN CM Short  Term Goal #2 Met Date  -- [goal restarted]   Interventions for Short Term Goal #2  Reemphasized with patient importance of exercise and controlling cholesterol.  Discussed how exercise helps to maintain muscle mass.    THN CM Short Term Goal #4 (0-30 days)  Patient will report adhering to low fat/low cholesterol diet within the next 30 days   THN CM Short Term Goal #4 Start Date  10/26/14   THN CM Short Term Goal #4 Met Date  11/20/14   Interventions for Short Term Goal #4  Discussed with patient attempt to increase intake of more fruits and vegetables.  Discussed foods high in fat content.      [place] Problem Two        Patient Outreach Telephone from 11/20/2014 in [place] for Problem Two  Not Active   THN CM Short Term Goal #2 (0-30 days)  patient will take and complete clindamycin antibiotic as instructed by her doctor within 30 days   [place] Fitzpatrick CM Short Term Goal #2 Met Date  10/26/14    [facility] CM Care Plan Problem Three        Patient Outreach Telephone from 11/20/2014 in [place] for Problem Three  Not Active   THN CM Short Term Goal #1 Start Date  -- [goal resarted]      Assessment: Patient will continue to benefit from monthly outreach for disease management.   Plan: RN Health Coach will contact patient within one month for monthly call and patient agrees to next outreach.    Jone Baseman, RN, MSN [place] [phone]

## 2014-11-21 ENCOUNTER — Other Ambulatory Visit: Payer: Self-pay | Admitting: Physical Medicine & Rehabilitation

## 2014-11-21 ENCOUNTER — Encounter: Payer: Self-pay | Admitting: Physical Medicine & Rehabilitation

## 2014-11-21 ENCOUNTER — Encounter: Payer: Medicare Other | Attending: Physical Medicine and Rehabilitation | Admitting: Physical Medicine & Rehabilitation

## 2014-11-21 VITALS — BP 115/66 | HR 66 | Resp 16

## 2014-11-21 DIAGNOSIS — B0229 Other postherpetic nervous system involvement: Secondary | ICD-10-CM

## 2014-11-21 DIAGNOSIS — M4716 Other spondylosis with myelopathy, lumbar region: Secondary | ICD-10-CM | POA: Diagnosis not present

## 2014-11-21 DIAGNOSIS — G609 Hereditary and idiopathic neuropathy, unspecified: Secondary | ICD-10-CM

## 2014-11-21 DIAGNOSIS — Z5181 Encounter for therapeutic drug level monitoring: Secondary | ICD-10-CM

## 2014-11-21 DIAGNOSIS — G546 Phantom limb syndrome with pain: Secondary | ICD-10-CM

## 2014-11-21 DIAGNOSIS — Z79899 Other long term (current) drug therapy: Secondary | ICD-10-CM | POA: Diagnosis not present

## 2014-11-21 DIAGNOSIS — M069 Rheumatoid arthritis, unspecified: Secondary | ICD-10-CM | POA: Diagnosis not present

## 2014-11-21 DIAGNOSIS — M1712 Unilateral primary osteoarthritis, left knee: Secondary | ICD-10-CM

## 2014-11-21 DIAGNOSIS — Z89519 Acquired absence of unspecified leg below knee: Secondary | ICD-10-CM | POA: Diagnosis not present

## 2014-11-21 DIAGNOSIS — M179 Osteoarthritis of knee, unspecified: Secondary | ICD-10-CM | POA: Insufficient documentation

## 2014-11-21 DIAGNOSIS — G894 Chronic pain syndrome: Secondary | ICD-10-CM

## 2014-11-21 DIAGNOSIS — M1990 Unspecified osteoarthritis, unspecified site: Secondary | ICD-10-CM

## 2014-11-21 MED ORDER — FENTANYL 75 MCG/HR TD PT72: 75.0000 ug | MEDICATED_PATCH | TRANSDERMAL | Status: DC

## 2014-11-21 MED ORDER — OXYCODONE-ACETAMINOPHEN 10-325 MG PO TABS: 1.0000 | ORAL_TABLET | Freq: Four times a day (QID) | ORAL | Status: DC | PRN

## 2014-11-21 NOTE — Progress Notes (Signed)
Subjective:    Patient ID: Crystal Fitzpatrick, female    DOB: 1945/09/23, 69 y.o.   MRN: [id number]  HPI   Crystal Fitzpatrick is here in follow up her chronic pain. She tells me that she broke her front wheel on her wheelchair in the home the other day. Someone taped it up as it wasn't holding her weight any longer. (there is duct tape on the right front wheel today)  She has stayed fairly active at home. She uses her w/c for her primary means of mobility.   She is using her pain meds as prescribed. She is also using her yoga, relaxation techniques, and stretching as well.      Crystal Fitzpatrick had a bone density test which showed a T-score of -2.6 at the femur, -1.4 at radius.   Pain Inventory Average Pain 7 Pain Right Now 7 My pain is intermittent  In the last 24 hours, has pain interfered with the following? General activity 5 Relation with others 5 Enjoyment of life 6 What TIME of day is your pain at its worst? morning evening and night Sleep (in general) Poor  Pain is worse with: sitting and inactivity Pain improves with: medication Relief from Meds: 7  Mobility use a wheelchair transfers alone  Function disabled: date disabled . I need assistance with the following:  household duties and shopping  Neuro/Psych trouble walking  Prior Studies Any changes since last visit?  yes bone density eval  Physicians involved in your care Any changes since last visit?  no   Family History  Problem Relation Age of Onset  . Breast cancer Sister   . Diabetes Other   . Stroke Other     Grandparents   History   Social History  . Marital Status: Divorced    Spouse Name: N/A  . Number of Children: 1  . Years of Education: N/A   Occupational History  . retired Marketing executive    Social History Main Topics  . Smoking status: Former Games developer  . Smokeless tobacco: Never Used  . Alcohol Use: No  . Drug Use: No  . Sexual Activity: Not on file   Other Topics Concern  .  None   Social History Narrative   Past Surgical History  Procedure Laterality Date  . Amputation  02/02/2008    below right knee  . S/p egd  2007    with Botox for? Achalasia  . Tonsillectomy    . S/p breast biopsy  1992  . 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 Tubal ligation    . Nasal septum surgery    . Foot surgury  1980 and 1981   Past Medical History  Diagnosis Date  . DIABETES MELLITUS, TYPE II 07/17/2008  . B12 DEFICIENCY 07/17/2008  . Acute gouty arthropathy 12/11/2008  . HYPONATREMIA 03/20/2010  . HYPERKALEMIA 10/31/2009  . ANEMIA-NOS 07/17/2008  . ANXIETY 07/17/2008  . Chronic pain syndrome 02/14/2010  . Trigeminal neuralgia 02/14/2010  . HEARING LOSS, RIGHT EAR 06/04/2009  . HYPERTENSION 07/17/2008  . CHF 01/28/2009  . Pneumonia, organism unspecified 02/14/2010  . GERD 07/17/2008  . IBS 06/04/2009  . UNSPECIFIED HEPATITIS 01/28/2009  . RENAL INSUFFICIENCY 10/31/2009  . MENOPAUSAL DISORDER 12/03/2009  . Cellulitis and abscess of leg, except foot 11/15/2008  . SKIN ULCER, CHRONIC 02/08/2009  . Rheumatoid arthritis(714.0) 07/17/2008  . SKIN LESION 06/04/2009  . ARTHRITIS 01/28/2009  . FOOT PAIN 07/17/2008  . HYPERSOMNIA 02/14/2010  .  Altered mental status 02/07/2010  . PERIPHERAL EDEMA 08/30/2008  . Wheezing 12/03/2009  . ABDOMINAL DISTENSION 10/11/2008  . Dysuria 11/11/2009  . Hypoxemia 03/20/2010  . ANAPHYLACTIC SHOCK 05/15/2009  . NEUROPATHY, HX OF 01/28/2009  . COLONIC POLYPS, HX OF 07/17/2008  . AMPUTATION, BELOW KNEE, RIGHT, HX OF 01/28/2009  . Hyperlipemia 09/24/2010  . Hyperlipidemia 09/24/2010  . Seizure 10/26/2010  . Pneumonia, aspiration 12/01/2011    Episode July 2013, tx per [facility]  . Osteoporosis 11/16/2014   BP 115/66 mmHg  Pulse 66  Resp 16  SpO2 96%  Opioid Risk Score:   Fall Risk Score: Moderate Fall Risk (6-13 points) (previously educated and given handout)`1  Depression screen PHQ 2/9  Depression screen [facility] 2/9 11/20/2014 10/26/2014 09/27/2014  08/03/2014 07/04/2014 12/12/2012  Decreased Interest 0 0 0 0 0 0  Down, Depressed, Hopeless 0 0 0 1 0 1  PHQ - 2 Score 0 0 0 1 0 1  Altered sleeping - - 1 2 - -  Tired, decreased energy - - 0 0 - -  Change in appetite - - 1 1 - -  Feeling bad or failure about yourself  - - 1 2 - -  Trouble concentrating - - 0 0 - -  Moving slowly or fidgety/restless - - 0 0 - -  Suicidal thoughts - - 0 0 - -  PHQ-9 Score - - 3 6 - -     Review of Systems  Musculoskeletal: Positive for back pain, arthralgias, gait problem and neck pain.  All other systems reviewed and are negative.      Objective:   Physical Exam  Constitutional: She appears well-developed and well-nourished. She has lost weight  HENT: some facial tenderness and hypertensitivity  Head: Normocephalic and atraumatic.  Nose: Nose normal.  Mouth/Throat: Oropharynx is clear and moist.  Eyes: Conjunctivae are normal.  Neck: Normal range of motion. Neck supple.  Cardiovascular: Normal rate and regular rhythm.  Pulmonary/Chest: No respiratory distress. She has no wheezes.  Musculoskeletal:  Edema gone RLE, 1+LLE. Chronic stasis changes are noted on the left leg still. Right tibia is prominent but non-tender. Weight is improved.    Neurological: A sensory deficit (left foot), right leg to PP and LT Psychiatric: She has a normal mood and affect. Her behavior is normal. Judgment and thought content normal.   W/c is in disrepair. Right front wheel is damaged. Seat cushion and arms rests are cracking/falling apart.   Assessment & Plan:   ASSESSMENT:  1. Right below-knee amputation, history of phantom limb pain.  2. Peripheral neuropathy.  3. History of rheumatoid arthritis.  4. Osteoarthritis of left knee-  5. Chronic cellulitis, left leg with associated edema.  6. Left patella fx (per pt) after fall at home 2 weeks ago  7. 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 Osteoporosis by DEXA scan last month.    PLAN:  1. Will contact vendor for replacement of right front wheel chair wheel and arm rest/seats when pt provides [place] the contact information 2. I discussed safe exercises and activities she can pursue. I reassured her that she is not at imminent risk for spontaneous fracture, although she needs to be safe with transfers and mobility.  3. Maintain gabapentin at TID with QID for flare, watching closely for edema.  4. Refilled her pain medications today including Percocet 10/325, #120  and fentanyl 75 mcg #10.   6. She will  follow up with my NP in one month. 30 minutes of face to face patient care time were spent during this visit. All questions were encouraged and answered. We discussed safety options at home today, including a fall guard for bed.

## 2014-11-21 NOTE — Patient Instructions (Addendum)
PLEASE CALL ME WITH ANY PROBLEMS OR QUESTIONS (#[phone]).  HAVE A GOOD DAY!   PROVIDE [place] WHEELCHAIR VENDOR'S ADDRESS/FAX AND WE'LL SEND AN ORDER FOR NEW WHEEL/REPAIR TO SEAT/CUSHIONS

## 2014-11-22 ENCOUNTER — Ambulatory Visit: Payer: Medicare Other

## 2014-11-22 LAB — PMP ALCOHOL METABOLITE (ETG): Ethyl Glucuronide (EtG): NEGATIVE ng/mL

## 2014-11-23 ENCOUNTER — Other Ambulatory Visit: Payer: Self-pay | Admitting: Internal Medicine

## 2014-11-26 ENCOUNTER — Encounter: Payer: Self-pay | Admitting: Internal Medicine

## 2014-11-26 LAB — BENZODIAZEPINES (GC/LC/MS), URINE
Alprazolam metabolite (GC/LC/MS), ur confirm: NEGATIVE ng/mL — AB (ref <25)
Clonazepam metabolite (GC/LC/MS), ur confirm: NEGATIVE ng/mL (ref <25)
FLURAZEPAMU: NEGATIVE ng/mL (ref <50)
Lorazepam (GC/LC/MS), ur confirm: NEGATIVE ng/mL (ref <50)
Midazolam (GC/LC/MS), ur confirm: NEGATIVE ng/mL (ref <50)
Nordiazepam (GC/LC/MS), ur confirm: NEGATIVE ng/mL (ref <50)
OXAZEPAMU: NEGATIVE ng/mL (ref <50)
TEMAZEPAMU: NEGATIVE ng/mL (ref <50)
Triazolam metabolite (GC/LC/MS), ur confirm: NEGATIVE ng/mL (ref <50)

## 2014-11-26 LAB — OPIATES/OPIOIDS (LC/MS-MS)
CODEINE URINE: NEGATIVE ng/mL (ref <50)
Hydrocodone: NEGATIVE ng/mL (ref <50)
Hydromorphone: NEGATIVE ng/mL (ref <50)
MORPHINE: NEGATIVE ng/mL (ref <50)
NORHYDROCODONE, UR: NEGATIVE ng/mL (ref <50)
NOROXYCODONE, UR: 5125 ng/mL (ref <50)
Oxycodone, ur: 823 ng/mL (ref <50)
Oxymorphone: 1040 ng/mL (ref <50)

## 2014-11-26 LAB — OXYCODONE, URINE (LC/MS-MS)
NOROXYCODONE, UR: 5125 ng/mL (ref <50)
Oxycodone, ur: 823 ng/mL (ref <50)
Oxymorphone: 1040 ng/mL (ref <50)

## 2014-11-26 LAB — FENTANYL (GC/LC/MS), URINE
Fentanyl, confirm: 14.5 ng/mL (ref <0.5)
Norfentanyl, confirm: 451.8 ng/mL (ref <0.5)

## 2014-11-27 LAB — PRESCRIPTION MONITORING PROFILE (SOLSTAS)
Amphetamine/Meth: NEGATIVE ng/mL
BARBITURATE SCREEN, URINE: NEGATIVE ng/mL
Buprenorphine, Urine: NEGATIVE ng/mL
CANNABINOID SCRN UR: NEGATIVE ng/mL
CARISOPRODOL, URINE: NEGATIVE ng/mL
Cocaine Metabolites: NEGATIVE ng/mL
Creatinine, Urine: 102.33 mg/dL (ref >20.0)
ECSTASY: NEGATIVE ng/mL
METHADONE SCREEN, URINE: NEGATIVE ng/mL
Meperidine, Ur: NEGATIVE ng/mL
Nitrites, Initial: NEGATIVE ug/mL
PH URINE, INITIAL: 6.9 pH (ref 4.5–8.9)
Propoxyphene: NEGATIVE ng/mL
Tapentadol, urine: NEGATIVE ng/mL
Tramadol Scrn, Ur: NEGATIVE ng/mL
Zolpidem, Urine: NEGATIVE ng/mL

## 2014-12-12 NOTE — Progress Notes (Signed)
Urine drug screen for this encounter is consistent for prescribed medication 

## 2014-12-18 ENCOUNTER — Ambulatory Visit: Payer: Medicare Other

## 2014-12-18 ENCOUNTER — Other Ambulatory Visit: Payer: Self-pay | Admitting: Internal Medicine

## 2014-12-18 ENCOUNTER — Other Ambulatory Visit: Payer: Self-pay

## 2014-12-18 NOTE — Patient Outreach (Signed)
[facility]) Care Management  12/18/2014  Crystal Fitzpatrick [birth year] [id number]   Telephone call to patient for disease management follow up.  Unable to reach.  HIPPA compliant voice message left.   Plan: RN Health Coach will attempt telephone outreach within 1-2 weeks.    Bary Leriche, RN, MSN [facility] Care Management RN Telephonic Health Coach [phone]

## 2014-12-19 ENCOUNTER — Encounter: Payer: Self-pay | Admitting: Registered Nurse

## 2014-12-19 ENCOUNTER — Encounter: Payer: Medicare Other | Attending: Physical Medicine and Rehabilitation | Admitting: Registered Nurse

## 2014-12-19 VITALS — BP 134/58 | HR 63 | Resp 16

## 2014-12-19 DIAGNOSIS — M069 Rheumatoid arthritis, unspecified: Secondary | ICD-10-CM | POA: Diagnosis not present

## 2014-12-19 DIAGNOSIS — Z79899 Other long term (current) drug therapy: Secondary | ICD-10-CM

## 2014-12-19 DIAGNOSIS — Z89519 Acquired absence of unspecified leg below knee: Secondary | ICD-10-CM

## 2014-12-19 DIAGNOSIS — G609 Hereditary and idiopathic neuropathy, unspecified: Secondary | ICD-10-CM | POA: Insufficient documentation

## 2014-12-19 DIAGNOSIS — L03116 Cellulitis of left lower limb: Secondary | ICD-10-CM

## 2014-12-19 DIAGNOSIS — G894 Chronic pain syndrome: Secondary | ICD-10-CM

## 2014-12-19 DIAGNOSIS — M4716 Other spondylosis with myelopathy, lumbar region: Secondary | ICD-10-CM | POA: Diagnosis not present

## 2014-12-19 DIAGNOSIS — Z5181 Encounter for therapeutic drug level monitoring: Secondary | ICD-10-CM

## 2014-12-19 DIAGNOSIS — M179 Osteoarthritis of knee, unspecified: Secondary | ICD-10-CM | POA: Diagnosis not present

## 2014-12-19 MED ORDER — OXYCODONE-ACETAMINOPHEN 10-325 MG PO TABS: 1.0000 | ORAL_TABLET | Freq: Four times a day (QID) | ORAL | Status: DC | PRN

## 2014-12-19 MED ORDER — FENTANYL 75 MCG/HR TD PT72: 75.0000 ug | MEDICATED_PATCH | TRANSDERMAL | Status: DC

## 2014-12-19 NOTE — Progress Notes (Signed)
Subjective:    Patient ID: Crystal Fitzpatrick, female    DOB: 11/30/1945, 69 y.o.   MRN: [id number]  HPI: [name] is a 69 year old female who returns for follow up for chronic pain and medication refill. She says her pain is located in her lower back. She rates her pain 6.Her current exercise regime is yoga seven days a week and performing stretching exercises with bands and chair exercises daily. Arrived to clinic in wheelchair. She states one of her fentanyl patches went into the toilet, hopefully her insurance will cover for refill today she is aware and verbalizes understanding. Also states she noticed yesterday her left lower extremity was reddened, she has a history of cellulitis she will be following up with Dr. Jonny Ruiz her PCP she states.  Pain Inventory Average Pain 8 Pain Right Now 6 My pain is sharp, burning, stabbing and aching  In the last 24 hours, has pain interfered with the following? General activity 3 Relation with others 5 Enjoyment of life 2 What TIME of day is your pain at its worst? morning and night Sleep (in general) Fair  Pain is worse with: sitting and inactivity Pain improves with: therapy/exercise and medication Relief from Meds: no answer  Mobility use a wheelchair transfers alone  Function retired Do you have any goals in this area?  no  Neuro/Psych No problems in this area  Prior Studies Any changes since last visit?  no  Physicians involved in your care Any changes since last visit?  no   Family History  Problem Relation Age of Onset  . Breast cancer Sister   . Diabetes Other   . Stroke Other     Grandparents   History   Social History  . Marital Status: Divorced    Spouse Name: N/A  . Number of Children: 1  . Years of Education: N/A   Occupational History  . retired Marketing executive    Social History Main Topics  . Smoking status: Former Games developer  . Smokeless tobacco: Never Used  . Alcohol Use: No  .  Drug Use: No  . Sexual Activity: Not on file   Other Topics Concern  . None   Social History Narrative   Past Surgical History  Procedure Laterality Date  . Amputation  02/02/2008    below right knee  . S/p egd  2007    with Botox for? Achalasia  . Tonsillectomy    . S/p breast biopsy  1992  . 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 Tubal ligation    . Nasal septum surgery    . Foot surgury  1980 and 1981   Past Medical History  Diagnosis Date  . DIABETES MELLITUS, TYPE II 07/17/2008  . B12 DEFICIENCY 07/17/2008  . Acute gouty arthropathy 12/11/2008  . HYPONATREMIA 03/20/2010  . HYPERKALEMIA 10/31/2009  . ANEMIA-NOS 07/17/2008  . ANXIETY 07/17/2008  . Chronic pain syndrome 02/14/2010  . Trigeminal neuralgia 02/14/2010  . HEARING LOSS, RIGHT EAR 06/04/2009  . HYPERTENSION 07/17/2008  . CHF 01/28/2009  . Pneumonia, organism unspecified 02/14/2010  . GERD 07/17/2008  . IBS 06/04/2009  . UNSPECIFIED HEPATITIS 01/28/2009  . RENAL INSUFFICIENCY 10/31/2009  . MENOPAUSAL DISORDER 12/03/2009  . Cellulitis and abscess of leg, except foot 11/15/2008  . SKIN ULCER, CHRONIC 02/08/2009  . Rheumatoid arthritis(714.0) 07/17/2008  . SKIN LESION 06/04/2009  . ARTHRITIS 01/28/2009  . FOOT PAIN 07/17/2008  . HYPERSOMNIA 02/14/2010  .  Altered mental status 02/07/2010  . PERIPHERAL EDEMA 08/30/2008  . Wheezing 12/03/2009  . ABDOMINAL DISTENSION 10/11/2008  . Dysuria 11/11/2009  . Hypoxemia 03/20/2010  . ANAPHYLACTIC SHOCK 05/15/2009  . NEUROPATHY, HX OF 01/28/2009  . COLONIC POLYPS, HX OF 07/17/2008  . AMPUTATION, BELOW KNEE, RIGHT, HX OF 01/28/2009  . Hyperlipemia 09/24/2010  . Hyperlipidemia 09/24/2010  . Seizure 10/26/2010  . Pneumonia, aspiration 12/01/2011    Episode July 2013, tx per [facility]  . Osteoporosis 11/16/2014   BP 134/58 mmHg  Pulse 63  Resp 16  SpO2 96%  Opioid Risk Score:   Fall Risk Score:  `1  Depression screen PHQ 2/9  Depression screen [facility] 2/9 12/19/2014 11/20/2014  10/26/2014 09/27/2014 08/03/2014 07/04/2014 12/12/2012  Decreased Interest 0 0 0 0 0 0 0  Down, Depressed, Hopeless 0 0 0 0 1 0 1  PHQ - 2 Score 0 0 0 0 1 0 1  Altered sleeping - - - 1 2 - -  Tired, decreased energy - - - 0 0 - -  Change in appetite - - - 1 1 - -  Feeling bad or failure about yourself  - - - 1 2 - -  Trouble concentrating - - - 0 0 - -  Moving slowly or fidgety/restless - - - 0 0 - -  Suicidal thoughts - - - 0 0 - -  PHQ-9 Score - - - 3 6 - -    Review of Systems  Skin: Positive for color change.       Left leg is reddened with signs of cellulitis returning  All other systems reviewed and are negative.      Objective:   Physical Exam  Constitutional: She is oriented to person, place, and time. She appears well-developed and well-nourished.  HENT:  Head: Normocephalic and atraumatic.  Neck: Normal range of motion. Neck supple.  Cardiovascular: Normal rate and regular rhythm.   Pulmonary/Chest: Effort normal and breath sounds normal.  Musculoskeletal: She exhibits edema.  Normal Muscle Bulk and Muscle Testing Reveals: Upper Extremities: Full ROM and Muscle Strength 5/5 Lumbar Paraspinal Tenderness: L-3- L-5 Lower Extremities: Right BKA Left: Full ROM and Muscle Strength 5/5 Arrived in wheelchair  Neurological: She is alert and oriented to person, place, and time.  Skin: Skin is warm and dry.  Left Lower Extremity reddened/ Edema Noted  Psychiatric: She has a normal mood and affect.  Nursing note and vitals reviewed.         Assessment & Plan:  1.Right below-knee amputation, history of phantom limb pain.: No complaints at this time regarding phantom pain.Continue Current Medication Regime. Continue to Monitor.  2. Peripheral neuropathy: Continue Gabapentin.  Refilled: Fenatnyl Patch 75 mcg one patch every three days #10 and Oxycodone 10/325mg  one tablet every 6 hours as needed #120  3. History of rheumatoid arthritis. Continue Current Medication and  Exercise and heat Therapy.  4. Osteoarthritis of left knee: Continue with Voltaren gel/ Exercise and heat therapy.  5. Chronic cellulitis, left leg with associated edema:  20 minutes of face to face patient care time was spent during this visit. All questions were encouraged and answered.   F/U in 1 month

## 2014-12-20 ENCOUNTER — Telehealth: Payer: Self-pay | Admitting: *Deleted

## 2014-12-20 ENCOUNTER — Other Ambulatory Visit: Payer: Self-pay

## 2014-12-20 ENCOUNTER — Ambulatory Visit: Payer: Medicare Other

## 2014-12-20 ENCOUNTER — Encounter: Payer: Medicare Other | Admitting: Registered Nurse

## 2014-12-20 NOTE — Patient Outreach (Signed)
[facility]) Care Management  12/20/2014  Hiilani Jetter Sep 09, 1945 [id number]   Telephone call to patient for monthly outreach.  No answer.  HIPAA compliant voice message left.    Plan: RN Health Coach will attempt outreach within the next the next week.  Bary Leriche, RN, MSN [facility] Care Management RN Telephonic Health Coach [phone]

## 2014-12-20 NOTE — Telephone Encounter (Signed)
I called pt to see when her most recent mammogram was/place order if needed. No answer. Left mess for patient to call back.  

## 2014-12-27 ENCOUNTER — Other Ambulatory Visit: Payer: Self-pay

## 2014-12-27 NOTE — Patient Outreach (Signed)
[facility]) Care Management  12/27/2014  Crystal Fitzpatrick 1945-08-08 [id number]   Telephone call to patient for monthly outreach.  Patient reports she is not feeling good and requests reschedule call. She states her stomach is bothering her.  She reports some diarrhea. Patient denies any nausea or vomiting.  Advised on drinking plenty of fluids to remain hydrated.  She verbalized understanding.    Plan: RN Health Coach will contact patient within 1-2 weeks.    Bary Leriche, RN, MSN [facility] Care Management RN Telephonic Health Coach [phone]

## 2015-01-08 ENCOUNTER — Ambulatory Visit: Payer: Medicare Other

## 2015-01-08 ENCOUNTER — Other Ambulatory Visit: Payer: Self-pay

## 2015-01-08 NOTE — Patient Outreach (Signed)
[facility]) Care Management  01/08/2015  Crystal Fitzpatrick [birth year] [id number]   Third telephone call to patient for monthly outreach.  No answer.  HIPAA compliant voice message left.     Plan: RN will send outreach letter to attempt contact.    Bary Leriche, RN, MSN [facility] Care Management RN Telephonic Health Coach [phone]

## 2015-01-09 ENCOUNTER — Other Ambulatory Visit: Payer: Self-pay

## 2015-01-09 NOTE — Patient Outreach (Signed)
[facility]) Care Management  01/09/2015  Crystal Fitzpatrick 02/05/1946 [id number]   Returned telephone call to patient.  No answer.  HIPAA compliant voice message left.    Bary Leriche, RN, MSN [facility] Care Management RN Telephonic Health Coach [phone]

## 2015-01-15 ENCOUNTER — Encounter (HOSPITAL_BASED_OUTPATIENT_CLINIC_OR_DEPARTMENT_OTHER): Payer: Medicare Other | Admitting: Registered Nurse

## 2015-01-15 ENCOUNTER — Encounter: Payer: Self-pay | Admitting: Registered Nurse

## 2015-01-15 VITALS — BP 127/53 | HR 72

## 2015-01-15 DIAGNOSIS — G609 Hereditary and idiopathic neuropathy, unspecified: Secondary | ICD-10-CM | POA: Diagnosis not present

## 2015-01-15 DIAGNOSIS — Z5181 Encounter for therapeutic drug level monitoring: Secondary | ICD-10-CM | POA: Diagnosis not present

## 2015-01-15 DIAGNOSIS — Z79899 Other long term (current) drug therapy: Secondary | ICD-10-CM

## 2015-01-15 DIAGNOSIS — G894 Chronic pain syndrome: Secondary | ICD-10-CM | POA: Diagnosis not present

## 2015-01-15 DIAGNOSIS — M4716 Other spondylosis with myelopathy, lumbar region: Secondary | ICD-10-CM

## 2015-01-15 DIAGNOSIS — Z89519 Acquired absence of unspecified leg below knee: Secondary | ICD-10-CM | POA: Diagnosis not present

## 2015-01-15 DIAGNOSIS — M069 Rheumatoid arthritis, unspecified: Secondary | ICD-10-CM | POA: Diagnosis not present

## 2015-01-15 DIAGNOSIS — M179 Osteoarthritis of knee, unspecified: Secondary | ICD-10-CM | POA: Diagnosis not present

## 2015-01-15 MED ORDER — OXYCODONE-ACETAMINOPHEN 10-325 MG PO TABS: 1.0000 | ORAL_TABLET | Freq: Four times a day (QID) | ORAL | Status: DC | PRN

## 2015-01-15 MED ORDER — FENTANYL 75 MCG/HR TD PT72: 75.0000 ug | MEDICATED_PATCH | TRANSDERMAL | Status: DC

## 2015-01-15 NOTE — Progress Notes (Signed)
Subjective:    Patient ID: Crystal Fitzpatrick, female    DOB: Apr 23, 1946, 69 y.o.   MRN: [id number]  HPI: [name] is a 69 year old female who returns for follow up for chronic pain and medication refill. She says her pain is located in her lower back and left leg. She rates her pain 4.Her current exercise regime is yoga seven days a week and performing stretching exercises with bands and chair exercises daily.  Arrived to clinic in wheelchair.  Pain Inventory Average Pain 3 Pain Right Now 4 My pain is constant, sharp, burning and stabbing  In the last 24 hours, has pain interfered with the following? General activity 4 Relation with others 2 Enjoyment of life 3 What TIME of day is your pain at its worst? na Sleep (in general) NA  Pain is worse with: na Pain improves with: na Relief from Meds: na  Mobility use a wheelchair  Function Do you have any goals in this area?  no  Neuro/Psych No problems in this area  Prior Studies Any changes since last visit?  no  Physicians involved in your care Any changes since last visit?  no   Family History  Problem Relation Age of Onset  . Breast cancer Sister   . Diabetes Other   . Stroke Other     Grandparents   Social History   Social History  . Marital Status: Divorced    Spouse Name: N/A  . Number of Children: 1  . Years of Education: N/A   Occupational History  . retired Marketing executive    Social History Main Topics  . Smoking status: Former Games developer  . Smokeless tobacco: Never Used  . Alcohol Use: No  . Drug Use: No  . Sexual Activity: Not Asked   Other Topics Concern  . None   Social History Narrative   Past Surgical History  Procedure Laterality Date  . Amputation  02/02/2008    below right knee  . S/p egd  2007    with Botox for? Achalasia  . Tonsillectomy    . S/p breast biopsy  1992  . 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 Tubal ligation    . Nasal septum surgery     . Foot surgury  1980 and 1981   Past Medical History  Diagnosis Date  . DIABETES MELLITUS, TYPE II 07/17/2008  . B12 DEFICIENCY 07/17/2008  . Acute gouty arthropathy 12/11/2008  . HYPONATREMIA 03/20/2010  . HYPERKALEMIA 10/31/2009  . ANEMIA-NOS 07/17/2008  . ANXIETY 07/17/2008  . Chronic pain syndrome 02/14/2010  . Trigeminal neuralgia 02/14/2010  . HEARING LOSS, RIGHT EAR 06/04/2009  . HYPERTENSION 07/17/2008  . CHF 01/28/2009  . Pneumonia, organism unspecified 02/14/2010  . GERD 07/17/2008  . IBS 06/04/2009  . UNSPECIFIED HEPATITIS 01/28/2009  . RENAL INSUFFICIENCY 10/31/2009  . MENOPAUSAL DISORDER 12/03/2009  . Cellulitis and abscess of leg, except foot 11/15/2008  . SKIN ULCER, CHRONIC 02/08/2009  . Rheumatoid arthritis(714.0) 07/17/2008  . SKIN LESION 06/04/2009  . ARTHRITIS 01/28/2009  . FOOT PAIN 07/17/2008  . HYPERSOMNIA 02/14/2010  . Altered mental status 02/07/2010  . PERIPHERAL EDEMA 08/30/2008  . Wheezing 12/03/2009  . ABDOMINAL DISTENSION 10/11/2008  . Dysuria 11/11/2009  . Hypoxemia 03/20/2010  . ANAPHYLACTIC SHOCK 05/15/2009  . NEUROPATHY, HX OF 01/28/2009  . COLONIC POLYPS, HX OF 07/17/2008  . AMPUTATION, BELOW KNEE, RIGHT, HX OF 01/28/2009  . Hyperlipemia 09/24/2010  . Hyperlipidemia  09/24/2010  . Seizure 10/26/2010  . Pneumonia, aspiration 12/01/2011    Episode July 2013, tx per [facility]  . Osteoporosis 11/16/2014   BP 127/53 mmHg  Pulse 72  SpO2 95%  Opioid Risk Score:   Fall Risk Score:  `1  Depression screen PHQ 2/9  Depression screen [facility] 2/9 01/15/2015 12/19/2014 11/20/2014 10/26/2014 09/27/2014 08/03/2014 07/04/2014  Decreased Interest 0 0 0 0 0 0 0  Down, Depressed, Hopeless 0 0 0 0 0 1 0  PHQ - 2 Score 0 0 0 0 0 1 0  Altered sleeping - - - - 1 2 -  Tired, decreased energy - - - - 0 0 -  Change in appetite - - - - 1 1 -  Feeling bad or failure about yourself  - - - - 1 2 -  Trouble concentrating - - - - 0 0 -  Moving slowly or fidgety/restless - - - - 0 0 -  Suicidal  thoughts - - - - 0 0 -  PHQ-9 Score - - - - 3 6 -     Review of Systems  All other systems reviewed and are negative.      Objective:   Physical Exam  Constitutional: She is oriented to person, place, and time. She appears well-developed and well-nourished.  HENT:  Head: Normocephalic and atraumatic.  Neck: Normal range of motion. Neck supple.  Cardiovascular: Normal rate and regular rhythm.   Pulmonary/Chest: Effort normal and breath sounds normal.  Musculoskeletal: She exhibits edema.  Normal Muscle Bulk and Muscle Testing Reveals: Upper Extremities: Full ROM and Muscle Strength 5/5 Thoracic Paraspinal Tenderness: T-4-T-5 Mainly Left Side Lower Extremities: Right: BKA Left: Decreased ROM with Extension Muscle Strength 5/5 Arrived in wheelchair Pitting Edema 3+  Neurological: She is alert and oriented to person, place, and time.  Skin: Skin is warm and dry.  Psychiatric: She has a normal mood and affect.  Nursing note and vitals reviewed.         Assessment & Plan:  1.Right below-knee amputation, history of phantom limb pain.: No complaints at this time regarding phantom pain.Continue Current Medication Regime. Continue to Monitor.  2. Peripheral neuropathy: Continue Gabapentin.  Refilled: Fenatnyl Patch 75 mcg one patch every three days #10 and Oxycodone 10/325mg  one tablet every 6 hours as needed #120  3. History of rheumatoid arthritis. Continue Current Medication and Exercise and heat Therapy.  4. Osteoarthritis of left knee: Continue with Voltaren gel/ Exercise and heat therapy.  5. Chronic cellulitis, left leg with associated edema:PCP Following  20 minutes of face to face patient care time was spent during this visit. All questions were encouraged and answered.   F/U in 1 month

## 2015-01-17 ENCOUNTER — Ambulatory Visit: Payer: Medicare Other | Admitting: Registered Nurse

## 2015-01-24 NOTE — Patient Outreach (Signed)
[facility]) Care Management  01/24/2015  Crystal Fitzpatrick [birth year] [id number]   No response from patient after 3 outreach calls and letter.  Plan: RN Health Coach will forward patient information to Crystal Fitzpatrick for case closure.   RN Health Coach will send letter to primary care physician notifying of case closure.  Crystal Leriche, RN, MSN [facility] Care Management RN Telephonic Health Coach [phone]

## 2015-01-24 NOTE — Patient Outreach (Signed)
[facility]) Care Management  01/24/2015  Juliann Olesky [birth year] [id number]   Notification from Fleeta Emmer, RN to close case due to unable to contact patient for [facility] Care Management services.  Thanks, [name]. [name] [facility] Care Management [facility] CM Assistant Phone: [phone] Fax: [phone]

## 2015-02-11 ENCOUNTER — Other Ambulatory Visit: Payer: Self-pay

## 2015-02-11 DIAGNOSIS — I509 Heart failure, unspecified: Secondary | ICD-10-CM

## 2015-02-11 DIAGNOSIS — E118 Type 2 diabetes mellitus with unspecified complications: Secondary | ICD-10-CM

## 2015-02-11 DIAGNOSIS — I10 Essential (primary) hypertension: Secondary | ICD-10-CM

## 2015-02-11 NOTE — Patient Outreach (Signed)
[facility]) Care Management  02/11/2015  Chanelle Hodsdon 10-11-1945 [id number]   Return telephone call from patient. She is aware of case closure as she had not been able to be reached.  Patient states she is not doing well.  Having some problems with questionable cellulitis of her left leg.  She denies any weeping but some redness and warmth.  Patient has some history of heart failure but is not weighing. Patient is a right below the knee amputee.  Patient has also complains of increased urination.  Patient states she is going to call PCP for appointment. Patient has chronic back pain and sees pain management monthly.  She does do yoga to help with pain.  Patient is also a type 2 diabetic but does not check her sugars regularly.  However, she reports when she checks her sugar it ranges from 90-110.  Patient denies any falls.    Plan: RN Health Coach will have Nena Polio make patient active again. RN Health Coach will make a referral for community nurse.    Bary Leriche, RN, MSN [facility] Care Management RN Telephonic Health Coach [phone]

## 2015-02-11 NOTE — Patient Outreach (Signed)
[facility]) Care Management  02/11/2015  Crystal Fitzpatrick 26-Oct-1945 [id number]   Return phone call to patient after message left on 02-08-15. No answer. HIPAA compliant voice message left.   Bary Leriche, RN, MSN [facility] Care Management RN Telephonic Health Coach [phone]

## 2015-02-11 NOTE — Patient Outreach (Signed)
[facility]) Care Management  02/11/2015  Andera Cranmer March 20, 1946 [id number]   Request from Fleeta Emmer, RN to assign Community RN, assigned Rowe Pavy, RN.  Thanks, [name]. [name] [facility] Care Management [facility] CM Assistant Phone: [phone] Fax: [phone]

## 2015-02-12 ENCOUNTER — Other Ambulatory Visit: Payer: Self-pay

## 2015-02-12 ENCOUNTER — Encounter: Payer: Medicare Other | Attending: Physical Medicine and Rehabilitation | Admitting: Registered Nurse

## 2015-02-12 ENCOUNTER — Encounter: Payer: Self-pay | Admitting: Registered Nurse

## 2015-02-12 ENCOUNTER — Ambulatory Visit: Payer: Medicare Other | Admitting: Registered Nurse

## 2015-02-12 VITALS — BP 138/69 | HR 74

## 2015-02-12 DIAGNOSIS — L03116 Cellulitis of left lower limb: Secondary | ICD-10-CM | POA: Diagnosis not present

## 2015-02-12 DIAGNOSIS — M069 Rheumatoid arthritis, unspecified: Secondary | ICD-10-CM | POA: Diagnosis not present

## 2015-02-12 DIAGNOSIS — G894 Chronic pain syndrome: Secondary | ICD-10-CM

## 2015-02-12 DIAGNOSIS — Z79899 Other long term (current) drug therapy: Secondary | ICD-10-CM

## 2015-02-12 DIAGNOSIS — M4716 Other spondylosis with myelopathy, lumbar region: Secondary | ICD-10-CM | POA: Diagnosis not present

## 2015-02-12 DIAGNOSIS — Z5181 Encounter for therapeutic drug level monitoring: Secondary | ICD-10-CM

## 2015-02-12 DIAGNOSIS — M179 Osteoarthritis of knee, unspecified: Secondary | ICD-10-CM | POA: Insufficient documentation

## 2015-02-12 DIAGNOSIS — Z89519 Acquired absence of unspecified leg below knee: Secondary | ICD-10-CM

## 2015-02-12 DIAGNOSIS — E119 Type 2 diabetes mellitus without complications: Secondary | ICD-10-CM

## 2015-02-12 DIAGNOSIS — G609 Hereditary and idiopathic neuropathy, unspecified: Secondary | ICD-10-CM | POA: Diagnosis not present

## 2015-02-12 MED ORDER — OXYCODONE-ACETAMINOPHEN 10-325 MG PO TABS
1.0000 | ORAL_TABLET | Freq: Four times a day (QID) | ORAL | Status: DC | PRN
Start: 2015-02-12 — End: 2015-03-20

## 2015-02-12 MED ORDER — FENTANYL 75 MCG/HR TD PT72: 75.0000 ug | MEDICATED_PATCH | TRANSDERMAL | Status: DC

## 2015-02-12 NOTE — Progress Notes (Signed)
Subjective:    Patient ID: Crystal Fitzpatrick, female    DOB: 03-23-1946, 69 y.o.   MRN: [id number]  HPI: [name] is a 69 year old female who returns for follow up for chronic pain and medication refill. She says her pain is located in her lower back and left leg. She rates her pain 4.Her current exercise regime is yoga seven days a week and performing stretching exercises with bands and chair exercises daily. She was given twp prescriptions for Fentanyl due to October Scheduled appointment. Arrived to clinic in wheelchair.    Pain Inventory Average Pain 3 Pain Right Now 4 My pain is sharp, burning, tingling and aching  In the last 24 hours, has pain interfered with the following? General activity 4 Relation with others 5 Enjoyment of life 2 What TIME of day is your pain at its worst? morning and night Sleep (in general) NA  Pain is worse with: inactivity and some activites Pain improves with: medication Relief from Meds: NA  Mobility use a wheelchair transfers alone  Function disabled: date disabled . retired  Neuro/Psych bladder control problems  Prior Studies Any changes since last visit?  no  Physicians involved in your care Any changes since last visit?  no   Family History  Problem Relation Age of Onset  . Breast cancer Sister   . Diabetes Other   . Stroke Other     Grandparents   Social History   Social History  . Marital Status: Divorced    Spouse Name: N/A  . Number of Children: 1  . Years of Education: N/A   Occupational History  . retired Marketing executive    Social History Main Topics  . Smoking status: Former Games developer  . Smokeless tobacco: Never Used  . Alcohol Use: No  . Drug Use: No  . Sexual Activity: Not Asked   Other Topics Concern  . None   Social History Narrative   Past Surgical History  Procedure Laterality Date  . Amputation  02/02/2008    below right knee  . S/p egd  2007    with Botox for?  Achalasia  . Tonsillectomy    . S/p breast biopsy  1992  . 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 Tubal ligation    . Nasal septum surgery    . Foot surgury  1980 and 1981   Past Medical History  Diagnosis Date  . DIABETES MELLITUS, TYPE II 07/17/2008  . B12 DEFICIENCY 07/17/2008  . Acute gouty arthropathy 12/11/2008  . HYPONATREMIA 03/20/2010  . HYPERKALEMIA 10/31/2009  . ANEMIA-NOS 07/17/2008  . ANXIETY 07/17/2008  . Chronic pain syndrome 02/14/2010  . Trigeminal neuralgia 02/14/2010  . HEARING LOSS, RIGHT EAR 06/04/2009  . HYPERTENSION 07/17/2008  . CHF 01/28/2009  . Pneumonia, organism unspecified 02/14/2010  . GERD 07/17/2008  . IBS 06/04/2009  . UNSPECIFIED HEPATITIS 01/28/2009  . RENAL INSUFFICIENCY 10/31/2009  . MENOPAUSAL DISORDER 12/03/2009  . Cellulitis and abscess of leg, except foot 11/15/2008  . SKIN ULCER, CHRONIC 02/08/2009  . Rheumatoid arthritis(714.0) 07/17/2008  . SKIN LESION 06/04/2009  . ARTHRITIS 01/28/2009  . FOOT PAIN 07/17/2008  . HYPERSOMNIA 02/14/2010  . Altered mental status 02/07/2010  . PERIPHERAL EDEMA 08/30/2008  . Wheezing 12/03/2009  . ABDOMINAL DISTENSION 10/11/2008  . Dysuria 11/11/2009  . Hypoxemia 03/20/2010  . ANAPHYLACTIC SHOCK 05/15/2009  . NEUROPATHY, HX OF 01/28/2009  . COLONIC POLYPS, HX OF 07/17/2008  . AMPUTATION,  BELOW KNEE, RIGHT, HX OF 01/28/2009  . Hyperlipemia 09/24/2010  . Hyperlipidemia 09/24/2010  . Seizure 10/26/2010  . Pneumonia, aspiration 12/01/2011    Episode July 2013, tx per [facility]  . Osteoporosis 11/16/2014   BP 138/69  P 74  O2 sat% 90  Opioid Risk Score:   Fall Risk Score:  `1  Depression screen PHQ 2/9  Depression screen [facility] 2/9 02/12/2015 01/15/2015 12/19/2014 11/20/2014 10/26/2014 09/27/2014 08/03/2014  Decreased Interest 0 0 0 0 0 0 0  Down, Depressed, Hopeless 0 0 0 0 0 0 1  PHQ - 2 Score 0 0 0 0 0 0 1  Altered sleeping - - - - - 1 2  Tired, decreased energy - - - - - 0 0  Change in appetite - - - - - 1 1  Feeling  bad or failure about yourself  - - - - - 1 2  Trouble concentrating - - - - - 0 0  Moving slowly or fidgety/restless - - - - - 0 0  Suicidal thoughts - - - - - 0 0  PHQ-9 Score - - - - - 3 6     Review of Systems  Gastrointestinal: Positive for diarrhea.  Genitourinary:       Bladder control problems  All other systems reviewed and are negative.      Objective:   Physical Exam  Constitutional: She is oriented to person, place, and time. She appears well-developed and well-nourished.  HENT:  Head: Normocephalic and atraumatic.  Neck: Normal range of motion. Neck supple.  Cardiovascular: Normal rate and regular rhythm.   Pulmonary/Chest: Effort normal and breath sounds normal.  Musculoskeletal:  Normal Muscle Bulk and Muscle Testing Reveals: Upper Extremities: Full ROM and Muscle Strength 5/5 Lumbar Paraspinal Tenderness: L-3- L-5 Lower Extremities: Right: BKA Left: Full ROM and Muscle Strength 5/5   Neurological: She is alert and oriented to person, place, and time.  Skin: Skin is warm and dry.  Left Lower Extremity Cellulitis  Psychiatric: She has a normal mood and affect.  Nursing note and vitals reviewed.         Assessment & Plan:  1.Right below-knee amputation, history of phantom limb pain.: No complaints at this time regarding phantom pain.Continue Current Medication Regime. Continue to Monitor.  2. Peripheral neuropathy: Continue Gabapentin.  Refilled: Fenatnyl Patch 75 mcg one patch every three days #10 and Oxycodone 10/325mg  one tablet every 6 hours as needed #120  3. History of rheumatoid arthritis. Continue Current Medication and Exercise and heat Therapy.  4. Osteoarthritis of left knee: Continue with Voltaren gel/ Exercise and heat therapy.Schedule for Synvisc Left Knee with Dr. Riley Kill 5. Chronic cellulitis, left leg with associated edema:PCP Following  20 minutes of face to face patient care time was spent during this visit. All questions were  encouraged and answered.

## 2015-02-12 NOTE — Patient Outreach (Signed)
Telephone outreach attempt: Referral from health care for DM and HF management: Placed call to patient. No answer. Left a message for patient to return call.   Rowe Pavy, RN, BSN, CEN [facility] [phone]

## 2015-02-12 NOTE — Patient Outreach (Signed)
Telephone screening/ referral  Patient returned call. I explained purpose of call and referral from Health Coach.  Patient reports that she is interested in someone monitoring her potential/recurrent cellulitis. We discussed need for patient to see primary MD for symptoms of cellulitis. Reviewed signs and symptoms of infection with patient to include: fever, swelling, increase pain to site, redness, warmth to area and drainage from area. Patient verbalized understanding.   DM: Patient reports that she does not really have diabetes. Reports that she checks her blood sugar every other day. Reports reading 90-110. Yesterday's CBG reading of 92.  Reports that she sometime has problems with low blood sugar. Reports CBG is never high.  Patient reports last Hgb A1c of 7.0 last year. ( noted in Epic that patient refused HgbA1c check in April of 2016) Patient denies being on any medication for diabetes.   Heart failure: Patient reports that she does not have heart failure. 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 Denies shortness of breath or swelling. Discussed with patient that she is on a "lasix" and that medication helps her avoid fluid overload. Patient reports that last time she saw Dr. Jonny Ruiz that her lasix dose was cut in half. Patient reports that she is no longer on lasix because she has lost weight and does not have fluid overload. Aldo patient reports increased urination.   Chronic pain: Patient reports that she saw Dr. Riley Kill today for pain management and she is on fentanyl patches for chronic pain.   Patient reports that her only concern right now is that she has urinary frequency. States that she also has some  "burning on urination"  We discussed importance of seeing primary care doctor for symptoms of UTI and cellulitis. Patient request help getting appointment with MD.  I placed call to Dr. Melvyn Novas office and patient has an appointment on Friday September 30th  at 10:15. Placed call back to patient to inform. She states that she dose have transportation to this appointment. Patient expressed gratitude for assisting with scheduling MD appointment.   Plan:  Patient with no needs for home visit at this time. Will discuss case with Health Coach and determine future plan of care. Patient informed that a [facility] case manager would call her back in 1-2 weeks for follow up.   02/13/2015  Spoke with Fleeta Emmer,  [facility] health coach.  Case collaboration and discussion how to best assist patient with future needs.  Decision made for patient to be followed by health coach on a quarterly basis and as needed for patient questions or concerns. Order placed to transfer patient to health coach.  Rowe Pavy, RN, BSN, CEN [facility] [phone]

## 2015-02-13 NOTE — Patient Outreach (Signed)
[facility]) Care Management  02/13/2015  Archer Vise 1946/02/27 [id number]   Notification from Rowe Pavy, RN to assign RN Health Coach, Fleeta Emmer, RN assigned.  Thanks, [name]. [name] [facility] Care Management [facility] CM Assistant Phone: [phone] Fax: [phone]

## 2015-02-14 ENCOUNTER — Other Ambulatory Visit: Payer: Self-pay | Admitting: *Deleted

## 2015-02-14 MED ORDER — GABAPENTIN 600 MG PO TABS: 600.0000 mg | ORAL_TABLET | Freq: Three times a day (TID) | ORAL | Status: DC

## 2015-02-15 ENCOUNTER — Ambulatory Visit: Payer: Medicare Other | Admitting: Internal Medicine

## 2015-02-20 ENCOUNTER — Other Ambulatory Visit: Payer: Self-pay

## 2015-02-20 ENCOUNTER — Ambulatory Visit: Payer: Self-pay

## 2015-02-20 NOTE — Patient Outreach (Signed)
[facility]) Care Management  02/20/2015  Crystal Fitzpatrick 1946/04/29 [id number]  Telephone call to patient for health coach outreach.  No answer. HIPAA compliant voice message left.    Plan: RN Health Coach will attempt patient within 1-2 weeks.    Bary Leriche, RN, MSN [facility] Care Management RN Telephonic Health Coach [phone]

## 2015-02-25 ENCOUNTER — Other Ambulatory Visit: Payer: Self-pay

## 2015-02-25 ENCOUNTER — Telehealth: Payer: Self-pay

## 2015-02-25 DIAGNOSIS — E119 Type 2 diabetes mellitus without complications: Secondary | ICD-10-CM

## 2015-02-25 NOTE — Telephone Encounter (Signed)
Spoke with pt, I tried to get information for Dr. Merla Riches. She states her friend has severe pain issues but he has a lot  Of pride and gets frustrated very easily.  He has a hard time expressing himself verbally. He has a lot of issues with his back and legs. I advised her to have pt come in to see Dr. Merla Riches. FYI.  Zenovia Jarred  (her friend)

## 2015-02-25 NOTE — Patient Outreach (Signed)
[facility]) Care Management  02/25/2015  Jackye Dever October 23, 1945 [id number]   Telephone call to patient for health coach outreach.  No answer.  HIPAA compliant voice message left.    Plan: RN Health Coach will attempt patient within 1-2 weeks.    Bary Leriche, RN, MSN [facility] Care Management RN Telephonic Health Coach [phone]

## 2015-02-25 NOTE — Telephone Encounter (Signed)
Pt wants to talk with dr Merla Riches about a person she is sending over to see dr Merla Riches -she status she is a personal friend of doolittles

## 2015-02-25 NOTE — Patient Outreach (Signed)
[facility]) Care Management  [place]  02/25/2015   Crystal Fitzpatrick [birth year] [id number]  Subjective: Telephone call to patient for outreach.  Patient reports she missed her scheduled MD appointment and is trying to get transportation to her appointment.  Offered patient [facility] assistance with transportation but patient declined.  Patient still reports she thinks she has a UTI.  Stressed with patient importance of going to the MD regularly and having things checked.  Patient reports she will go in this week.  Discussed with patient consequences of not getting questionable UTI checked. She verbalized understanding.    Diabetes: Patient reports she is checking her blood sugar 2-3 times a week and is averaging about 120.  Patient admits she refused her last A1c.  Stressed the importance of having her A1c checked.  She verbalized understanding.    Objective:   Current Medications:  Current Outpatient Prescriptions  Medication Sig Dispense Refill  . albuterol (PROAIR HFA) 108 (90 BASE) MCG/ACT inhaler Inhale 2 puffs into the lungs every 6 (six) hours as needed.      [name] alendronate (FOSAMAX) 70 MG tablet Take 1 tablet (70 mg total) by mouth every 7 (seven) days. Take with a full glass of water on an empty stomach. 12 tablet 3  . ALPRAZolam (XANAX) 0.25 MG tablet TAKE ONE (1) TABLET THREE (3) TIMES EACHDAY AS NEEDED 90 tablet 2  . Ascorbic Acid (VITAMIN C) 500 MG tablet Take 500 mg by mouth daily.      . Blood Glucose Monitoring Suppl (ONE TOUCH ULTRA 2) W/DEVICE KIT 1 Device by Does not apply route once. 1 each 0  . cyclobenzaprine (FLEXERIL) 10 MG tablet TAKE 1 TABLET BY MOUTH EVERY 8 HOURS AS NEEDED 90 tablet 3  . diclofenac sodium (VOLTAREN) 1 % GEL Apply 2 g topically 4 (four) times daily. 3 Tube 3  . diphenoxylate-atropine (LOMOTIL) 2.5-0.025 MG per tablet TAKE 1 TABLET BY MOUTH 2 TIMES A DAY AS NEEDED 60 tablet 1  . DULoxetine (CYMBALTA) 60 MG capsule TAKE 2 CAPSULES BY  MOUTH DAILY 60 capsule 3  . fentaNYL (DURAGESIC - DOSED MCG/HR) 75 MCG/HR Place 1 patch (75 mcg total) onto the skin every 3 (three) days. 1 patch every 3 days 10 patch 0  . furosemide (LASIX) 40 MG tablet TAKE ONE (1) TABLET EACH DAY 90 tablet 3  . gabapentin (NEURONTIN) 600 MG tablet Take 1 tablet (600 mg total) by mouth 3 (three) times daily. 90 tablet 3  . meclizine (ANTIVERT) 25 MG tablet Take 1 tablet (25 mg total) by mouth 3 (three) times daily as needed. 30 tablet 0  . Multiple Vitamins-Minerals (CENTRUM SILVER ULTRA WOMENS PO) Take by mouth daily.      [name] olmesartan (BENICAR) 20 MG tablet Take 20 mg by mouth daily. Patient reports she is no longer taking the $RemoveBe'40mg'oxgNZVHww$  benicar tablets. States her doctor changed her dosage to 20 mg tablets.    [name] omega-3 acid ethyl esters (LOVAZA) 1 G capsule Take 1 capsule (1 g total) by mouth daily. 90 capsule 3  . ONETOUCH DELICA LANCETS MISC Use as directed to test blood sugar dx 250.00 100 each 2  . oxyCODONE-acetaminophen (PERCOCET) 10-325 MG tablet Take 1 tablet by mouth every 6 (six) hours as needed for pain. 120 tablet 0  . promethazine (PHENERGAN) 25 MG tablet Take 1 tablet (25 mg total) by mouth every 6 (six) hours as needed. for nausea 60 tablet 1  . Psyllium (METAMUCIL) 30.9 %  POWD Take by mouth. Take as directed     . topiramate (TOPAMAX) 100 MG tablet TAKE 3 TABLETS BY MOUTH EACH NIGHT AT BEDTIME 90 tablet 1  . triamcinolone (NASACORT AQ) 55 MCG/ACT AERO nasal inhaler Place 2 sprays into the nose daily. 1 Inhaler 12  . EPINEPHrine 0.3 mg/0.3 mL IJ SOAJ injection Inject 0.3 mLs (0.3 mg total) into the muscle once. (Patient not taking: Reported on 02/25/2015) 1 Device 2   No current facility-administered medications for this visit.    Functional Status:  In your present state of health, do you have any difficulty performing the following activities: 02/25/2015 11/20/2014  Hearing? N N  Vision? N Y  Difficulty concentrating or making decisions? N N   Walking or climbing stairs? Y Y  Dressing or bathing? N N  Doing errands, shopping? [name]  Preparing Food and eating ? N N  Using the Toilet? N N  In the past six months, have you accidently leaked urine? N N  Do you have problems with loss of bowel control? N N  Managing your Medications? N N  Managing your Finances? N N  Housekeeping or managing your Housekeeping? [name]    Fall/Depression Screening: PHQ 2/9 Scores 02/25/2015 02/12/2015 01/15/2015 12/19/2014 11/20/2014 10/26/2014 09/27/2014  PHQ - 2 Score 2 0 0 0 0 0 0  PHQ- 9 Score 8 - - - - - 3    Assessment: Patient will benefit from health coach outreach for disease management.    Plan:  [facility] CM Care Plan Problem One        Most Recent Value   Care Plan Problem One  Knowledge deficit diabetes   Role Documenting the Problem One  Health Coach   Care Plan for Problem One  Active   [facility] Long Term Goal (31-90 days)  Patient A1c will be less than 6.0 within 90 days   [facility] Long Term Goal Start Date  02/25/15   Interventions for Problem One Long Term Goal  RN Health discussed importance of having A1c checked.  Discussed A1c goals.   [facility] CM Short Term Goal #1 (0-30 days)  Patient will have A1c checked wtihin 30 days.     [facility] CM Short Term Goal #1 Start Date  02/25/15   Interventions for Short Term Goal #1  Discussed importance of knowing A1c as it relates to diabetic control.   [facility] CM Short Term Goal #2 (0-30 days)  Patient will see primary doctor within 30 days.     [facility] CM Short Term Goal #2 Start Date  02/25/15   Interventions for Short Term Goal #2  Discussed importance of keeping doctor's appointments.       RN Health Coach will provide ongoing education for patient on diabetes through phone calls and sending printed information to patient for further discussion.  RN Health Coach will send welcome packet with consent to patient as well as printed information on diabetes. RN Health Coach will send initial barriers letter, assessment, and  care plan to primary care physician.  RN Health Coach will contact patient within one month and patient agrees to next contact.   Jone Baseman, RN, MSN [place] [phone]

## 2015-02-26 ENCOUNTER — Telehealth: Payer: Self-pay

## 2015-02-26 DIAGNOSIS — N179 Acute kidney failure, unspecified: Secondary | ICD-10-CM | POA: Diagnosis not present

## 2015-02-26 DIAGNOSIS — R531 Weakness: Secondary | ICD-10-CM | POA: Diagnosis not present

## 2015-02-26 DIAGNOSIS — R509 Fever, unspecified: Secondary | ICD-10-CM | POA: Diagnosis not present

## 2015-02-26 DIAGNOSIS — R51 Headache: Secondary | ICD-10-CM | POA: Diagnosis not present

## 2015-02-26 DIAGNOSIS — M549 Dorsalgia, unspecified: Secondary | ICD-10-CM | POA: Diagnosis not present

## 2015-02-26 DIAGNOSIS — I639 Cerebral infarction, unspecified: Secondary | ICD-10-CM | POA: Diagnosis not present

## 2015-02-26 DIAGNOSIS — E860 Dehydration: Secondary | ICD-10-CM | POA: Diagnosis not present

## 2015-02-26 DIAGNOSIS — R030 Elevated blood-pressure reading, without diagnosis of hypertension: Secondary | ICD-10-CM | POA: Diagnosis not present

## 2015-02-26 NOTE — Telephone Encounter (Signed)
No [id number] or [id number] and called and LVM for the patient to call back to educate and schedule for wellness visit.

## 2015-02-27 DIAGNOSIS — I639 Cerebral infarction, unspecified: Secondary | ICD-10-CM | POA: Diagnosis not present

## 2015-02-27 DIAGNOSIS — N179 Acute kidney failure, unspecified: Secondary | ICD-10-CM | POA: Diagnosis not present

## 2015-02-27 DIAGNOSIS — N183 Chronic kidney disease, stage 3 (moderate): Secondary | ICD-10-CM | POA: Diagnosis not present

## 2015-02-27 DIAGNOSIS — R51 Headache: Secondary | ICD-10-CM | POA: Diagnosis not present

## 2015-02-27 DIAGNOSIS — E1152 Type 2 diabetes mellitus with diabetic peripheral angiopathy with gangrene: Secondary | ICD-10-CM | POA: Diagnosis not present

## 2015-02-27 DIAGNOSIS — G459 Transient cerebral ischemic attack, unspecified: Secondary | ICD-10-CM | POA: Diagnosis not present

## 2015-02-27 DIAGNOSIS — I517 Cardiomegaly: Secondary | ICD-10-CM | POA: Diagnosis not present

## 2015-02-27 DIAGNOSIS — R531 Weakness: Secondary | ICD-10-CM | POA: Diagnosis not present

## 2015-02-27 DIAGNOSIS — M5023 Other cervical disc displacement, cervicothoracic region: Secondary | ICD-10-CM | POA: Diagnosis not present

## 2015-02-27 NOTE — Telephone Encounter (Signed)
To call patient to discuss AWV; Per note, transportation can be an issue; also has apt 10/21 and may can schedule prior to seeing Dr. Jonny Ruiz 10/21 at 2:30; Will see if she can come in at 1:30... To have time to review DM and other issues; LVM to call back

## 2015-02-28 DIAGNOSIS — I129 Hypertensive chronic kidney disease with stage 1 through stage 4 chronic kidney disease, or unspecified chronic kidney disease: Secondary | ICD-10-CM | POA: Diagnosis not present

## 2015-02-28 DIAGNOSIS — N183 Chronic kidney disease, stage 3 (moderate): Secondary | ICD-10-CM | POA: Diagnosis not present

## 2015-02-28 DIAGNOSIS — N179 Acute kidney failure, unspecified: Secondary | ICD-10-CM | POA: Diagnosis not present

## 2015-02-28 DIAGNOSIS — I639 Cerebral infarction, unspecified: Secondary | ICD-10-CM | POA: Diagnosis not present

## 2015-03-01 ENCOUNTER — Other Ambulatory Visit: Payer: Self-pay | Admitting: *Deleted

## 2015-03-01 NOTE — Telephone Encounter (Signed)
This patient called in and stated she had been in ER in [facility] hospital and was just dc. Her hand was not moving; currently being evaluated. If she calls back., will plan to await her evaluation and fup and schedule AWV when appropriate based on her needs.

## 2015-03-01 NOTE — Patient Outreach (Signed)
[facility]) Care Management  03/01/2015  Crystal Fitzpatrick 1945-12-19 [id number]   Request from Fleeta Emmer, RN to assign Community RN, assigned [name], RN.  Thanks, [name]. [name] [facility] Care Management [facility] CM Assistant Phone: [phone] Fax: [phone]

## 2015-03-01 NOTE — Patient Outreach (Signed)
[facility]) Care Management  03/01/2015  Crystal Fitzpatrick [birth year] [id number]  Pt discharged from high [facility] on 10/13 Initial Transition of care outreach  RN spoke with pt today and introduced the [facility] services and purpose of today's call. Pt states she is doing well and has a follow up appointment next week. Pt able to recite some of the discharge information that was covered but has requested another follow up call next Monday as she will have more details. RN inquired on the transition care template and inquired if her provider has 24/7 services for their patients if needed. Pt not aware however will inquire more on the next provider's appointment next week. Further inquires on pt's medication as pt states she is not take one of the prescribed medications upon discharge (can't remember the name) but it was her cholesterol medication. Pt feels she is able to self manage her cholesterol with diet and exercise. Pt will discuss in detail with her doctor on other supplements to take other then "statin drugs". Pt does not know what her cholesterol reading or levels are at this time.  RN educated pt on the importance of taken the cholesterol medication as a prevention measures due to the unknown readings at this time and strongly encouraged pt to take if not contact her provider with this information. Again pt will discuss on the upcoming office visit. Pt currently denies any issues with no numbness or to her left arm as experienced on this last admission. Pt indicated she went to the hospital due to left arm numbness and underwent a work-up for CVA and all tests/result were negative as pt was discharged home. Pt reports her diabetes is under control with readings throughout her admission 80-90s with the highest reading at 120 upon admission. RN offered on several occassions for a home visit to assist pt further in managing her care however pt adamant that she is doing well with no  needs at this time. RN requested to continue follow up with a telephonic calls as pt was receptive. RN offered to follow up on Monday and pt indicates that would be fine around 2 PM. Will follow up accordingly as requested with ongoing transition of care outreach calls.  [name], RN Care Management Coordinator [facility] Main Office [phone]

## 2015-03-01 NOTE — Addendum Note (Signed)
Addended by: Bary Leriche on: 03/01/2015 02:07 PM   Modules accepted: Orders

## 2015-03-04 ENCOUNTER — Other Ambulatory Visit: Payer: Self-pay | Admitting: *Deleted

## 2015-03-04 DIAGNOSIS — E109 Type 1 diabetes mellitus without complications: Secondary | ICD-10-CM

## 2015-03-04 NOTE — Patient Outreach (Signed)
[facility]) Care Management  03/01/2015  Crystal Fitzpatrick 1945-05-23 [id number]   Message from patient she states she was recently discharged from [facility] for questionable stroke.  Will send to [facility] for Transition of Care.  Bary Leriche, RN, MSN [facility] Care Management RN Telephonic Health Coach [phone]

## 2015-03-04 NOTE — Patient Outreach (Signed)
[facility]) Care Management  03/04/2015  Keiarah Orlowski [birth year] [id number]   Transition of care (week 2)  RN spoke with pt today who indicates she is doing well with no problems. Continues to reports she will take the prescribed cholesterol medicine and she has not contacted her primary doctor to make a post-op hospital office visit yet. RN offered once again to intervene however pt states she has all her other appointments and will call Dr. Jonny Ruiz and make an appointment this week. RN stress the importance of making the appointment within the first 7 days of being discharge to prevent possible risk of re-admission (pt verbalized an understanding).  Pt also states she wishes to change providers to a local [place] and will request Dr. Jonny Ruiz to change her primary to the new office near her residence. Pt states she is in [place].  Pt express her gratitude for RN case manager being so "concerning and caring" about her recovery since her discharge and promises to contact her provider and again schedule an appointment. Pt reports she may need additional resources for transportation due to her location and has excepted a referral via social work consult with [facility].  RN will follow up on next Monday concerning pt's progress.   [name], RN Care Management Coordinator [facility] Main Office [phone]

## 2015-03-06 NOTE — Telephone Encounter (Signed)
Call to fup AWV and thanked her for calling but it sounds like she has a lot of health issues currently and may not be the best time to complete the exam. Left number in case she wants to call back but does not have too.

## 2015-03-07 ENCOUNTER — Telehealth: Payer: Self-pay

## 2015-03-07 NOTE — Telephone Encounter (Signed)
  Rec'd VM and called the patient back (original outreach for AWV)  and the patient stated that she had a hx of seizures many years ago with acute illness and was told etiology was related to her temp at that time.  [facility] to  [facility] for decreased in sensation to her left hand. 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   Still can't use left hand but does not know if this is "related to old stroke" and didn't know she had an old stroke.  Is suppose to start OP therapy;  She has hand coordination exercises for home use; fup next week Dr. Jonny Ruiz and is considering transferring care to [facility] as she lives in [place] and likes [facility] hospital.

## 2015-03-08 ENCOUNTER — Ambulatory Visit: Payer: Medicare Other | Admitting: Internal Medicine

## 2015-03-11 ENCOUNTER — Other Ambulatory Visit: Payer: Self-pay | Admitting: *Deleted

## 2015-03-11 NOTE — Patient Outreach (Signed)
[facility]) Care Management  03/11/2015  Crystal Fitzpatrick 07-Jan-1946 [id number]   Phone call to patient to discuss transportation needs.  Per patient she only needed the transportation resources on occasion as a back up.   Per patient, she has doctor visits in [address] and [place].  [facility])  discussed.  This social worker will send information about this transportation service by mail.  Request made for care management  assistant to mail information regarding [facility] Patient to call this social worker to confirm receipt of the information. This Child psychotherapist will follow up in approximately 1 week.   Adriana Reams [facility] Care Management [phone]

## 2015-03-11 NOTE — Patient Outreach (Signed)
[facility]) Care Management  03/11/2015  Crystal Fitzpatrick Feb 04, 1946 [id number]   Phone call to patient to discuss transportation needs.  Per patient she only needed the transportation resources on occasion as a back up.   Per patient, she has doctor visits in [address] and [place].  [facility])  discussed.  This social worker will send information about this transportation service by mail.  Request made for care coordinator assistant to mail information regarding [facility] Patient to call this social worker to confirm receipt of the information. This Child psychotherapist will follow up in approximately 1 week.   Adriana Reams [facility] Care Management [phone]

## 2015-03-13 ENCOUNTER — Ambulatory Visit: Payer: Medicare Other | Admitting: Internal Medicine

## 2015-03-13 ENCOUNTER — Other Ambulatory Visit: Payer: Self-pay | Admitting: *Deleted

## 2015-03-13 DIAGNOSIS — Z0289 Encounter for other administrative examinations: Secondary | ICD-10-CM

## 2015-03-13 NOTE — Patient Outreach (Signed)
[facility]) Care Management  03/13/2015  Madia Carvell Feb 11, 1946 [id number]  Transition of care (Third outreach)  RN attempted to reach pt today via ongoing transition of care however unsuccessful. RN able to leave a HIPAA approved voice message requesting a call back. Will inquire further at that time concerning [facility] services. Will continue outreach calls accordingly.   [name], RN Care Management Coordinator [facility] Main Office [phone]

## 2015-03-18 ENCOUNTER — Encounter: Payer: Medicare Other | Admitting: Physical Medicine & Rehabilitation

## 2015-03-19 ENCOUNTER — Encounter: Payer: Medicare Other | Admitting: Physical Medicine & Rehabilitation

## 2015-03-19 ENCOUNTER — Telehealth: Payer: Self-pay | Admitting: Physical Medicine & Rehabilitation

## 2015-03-19 NOTE — Telephone Encounter (Signed)
Pt cancelled her 03/18/2015 appt, then cancelled her rescheduled 03/19/2015 app, and now, is concerned that she will run out of her pain medication. How should we handle this?

## 2015-03-20 ENCOUNTER — Other Ambulatory Visit: Payer: Self-pay | Admitting: *Deleted

## 2015-03-20 ENCOUNTER — Other Ambulatory Visit: Payer: Self-pay

## 2015-03-20 DIAGNOSIS — M4716 Other spondylosis with myelopathy, lumbar region: Secondary | ICD-10-CM

## 2015-03-20 DIAGNOSIS — Z89519 Acquired absence of unspecified leg below knee: Secondary | ICD-10-CM

## 2015-03-20 MED ORDER — FENTANYL 75 MCG/HR TD PT72: 75.0000 ug | MEDICATED_PATCH | TRANSDERMAL | Status: DC

## 2015-03-20 MED ORDER — DULOXETINE HCL 60 MG PO CPEP: ORAL_CAPSULE | ORAL | Status: DC

## 2015-03-20 MED ORDER — OXYCODONE-ACETAMINOPHEN 10-325 MG PO TABS: 1.0000 | ORAL_TABLET | Freq: Four times a day (QID) | ORAL | Status: DC | PRN

## 2015-03-20 NOTE — Telephone Encounter (Signed)
Received fax from pharmacy, sent in RX.

## 2015-03-20 NOTE — Telephone Encounter (Signed)
Let's give her the script this time without an appointment and reschedule her appointment for one month.

## 2015-03-22 ENCOUNTER — Other Ambulatory Visit: Payer: Self-pay | Admitting: *Deleted

## 2015-03-22 NOTE — Patient Outreach (Signed)
[facility]) Care Management  03/22/2015  Crystal Fitzpatrick Oct 25, 1945 [id number]  Transition of care (4th Outreach)  RN spoke with pt today concerning ongoing [facility] services and inquired on her ongoing recovery. Pt states she continues to recover well with no major issues at this time. RN inquired if pt was able to scheduled her primary doctor's appointment following her recent discharge. Pt indicated she wishes to see her pain provider Dr. Riley Kill before visiting or making an appointment with her primary. RN discussed the importance once again on why it was importance to consult her primary doctor post-hospital discharge to better manage her medications and treat plan of care to prevent possible readmissions. RN offered to assist and make an appointment at a convenient time and date around her other appointments however pt declined this offer. RN also inquired about community home visits as pt has declined in the past however RN extended the offer for further engagement of [facility] services with community home visits to further educate on her ongoing medical issues and assist with needed resources however pt opt to again decline services indicating she is managing her care well and feels she is doing great with her ongoing recovery. RN also extended the offer for telephonic nursing to follow up as needed with her care however pt has opt also decline this services. Being that pt has not visited her primary doctor at this time no updated A1C has been perform that this RN can view at this time. RN inquired on the social worker's involvement with the attempts to assist with transportation in pt's residential area [facility]).  Pt's plan of care and goals were discussed related to her making arrangements to visit her primary for a post-op hospital visit was strongly encourage ASAP and the reasons why it is important. Pt verbalized an understanding and indicated she would call and make the appointment.  Due to pt's decline for ongoing [facility] services pt will be discharged from [facility] services relating to the nursing or care management services but will remain involved with the social work concerning needed resources. Will update provider once all services cease on behalf of [facility]. Will follow up with [employer] (social worker involved).  [name], RN Care Management Coordinator [facility] Main Office [phone]

## 2015-03-25 ENCOUNTER — Encounter: Payer: Self-pay | Admitting: *Deleted

## 2015-03-25 ENCOUNTER — Ambulatory Visit: Payer: Medicare Other

## 2015-03-25 ENCOUNTER — Other Ambulatory Visit: Payer: Self-pay | Admitting: *Deleted

## 2015-03-25 NOTE — Progress Notes (Signed)
This encounter was created in error - please disregard.

## 2015-03-25 NOTE — Patient Outreach (Signed)
[facility]) Care Management  03/25/2015  Crystal Fitzpatrick 05/07/1946 [id number]   Phone call to patient to follow up on community resources received.  Per patient community resources received, however needed the phone number for the transportation in [facility] not [facility] as previously stated.  Phone number provided [phone].   Patient discussed having no further social work needs.  Case to be closed to social work at this time.    Adriana Reams [facility] Care Management [phone]

## 2015-03-26 ENCOUNTER — Encounter: Payer: Medicare Other | Attending: Physical Medicine and Rehabilitation | Admitting: Physical Medicine & Rehabilitation

## 2015-03-26 ENCOUNTER — Encounter: Payer: Self-pay | Admitting: Physical Medicine & Rehabilitation

## 2015-03-26 VITALS — BP 127/77 | HR 69

## 2015-03-26 DIAGNOSIS — M069 Rheumatoid arthritis, unspecified: Secondary | ICD-10-CM | POA: Diagnosis not present

## 2015-03-26 DIAGNOSIS — Z89519 Acquired absence of unspecified leg below knee: Secondary | ICD-10-CM | POA: Insufficient documentation

## 2015-03-26 DIAGNOSIS — M4716 Other spondylosis with myelopathy, lumbar region: Secondary | ICD-10-CM | POA: Diagnosis not present

## 2015-03-26 DIAGNOSIS — L03116 Cellulitis of left lower limb: Secondary | ICD-10-CM

## 2015-03-26 DIAGNOSIS — M179 Osteoarthritis of knee, unspecified: Secondary | ICD-10-CM | POA: Insufficient documentation

## 2015-03-26 DIAGNOSIS — G609 Hereditary and idiopathic neuropathy, unspecified: Secondary | ICD-10-CM | POA: Diagnosis not present

## 2015-03-26 DIAGNOSIS — M1712 Unilateral primary osteoarthritis, left knee: Secondary | ICD-10-CM | POA: Diagnosis not present

## 2015-03-26 MED ORDER — CEPHALEXIN 500 MG PO CAPS: 500.0000 mg | ORAL_CAPSULE | Freq: Three times a day (TID) | ORAL | Status: DC

## 2015-03-26 NOTE — Progress Notes (Signed)
Subjective:    Patient ID: Crystal Fitzpatrick, female    DOB: 06-14-1945, 69 y.o.   MRN: [id number]  HPI  Crystal Fitzpatrick is here for a synvisc injection today to the left knee. Unfortunately she recently developed a case of cellulitis in the left ankle/leg. She hasnt been on abx yet for the cellulitis.  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  She feels that she's recovering but hasn't regained full function on left side yet.  Pain Inventory Average Pain 4 Pain Right Now 4 My pain is sharp, burning, stabbing and tingling  In the last 24 hours, has pain interfered with the following? General activity 2 Relation with others 3 Enjoyment of life 1 What TIME of day is your pain at its worst? Morning and Night Sleep (in general) NA  Pain is worse with: sitting and inactivity Pain improves with: rest, therapy/exercise and medication Relief from Meds: NA  Mobility Do you have any goals in this area?  no  Function Do you have any goals in this area?  no  Neuro/Psych No problems in this area  Prior Studies Any changes since last visit?  yes  Physicians involved in your care Any changes since last visit?  no   Family History  Problem Relation Age of Onset  . Breast cancer Sister   . Diabetes Other   . Stroke Other     Grandparents   Social History   Social History  . Marital Status: Divorced    Spouse Name: N/A  . Number of Children: 1  . Years of Education: N/A   Occupational History  . retired Marketing executive    Social History Main Topics  . Smoking status: Former Games developer  . Smokeless tobacco: Never Used  . Alcohol Use: No  . Drug Use: No  . Sexual Activity: Not Asked   Other Topics Concern  . None   Social History Narrative   Past Surgical History  Procedure Laterality Date  . Amputation  02/02/2008    below right knee  . S/p egd  2007    with Botox for? Achalasia  .  Tonsillectomy    . S/p breast biopsy  1992  . 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 Tubal ligation    . Nasal septum surgery    . Foot surgury  1980 and 1981   Past Medical History  Diagnosis Date  . DIABETES MELLITUS, TYPE II 07/17/2008  . B12 DEFICIENCY 07/17/2008  . Acute gouty arthropathy 12/11/2008  . HYPONATREMIA 03/20/2010  . HYPERKALEMIA 10/31/2009  . ANEMIA-NOS 07/17/2008  . ANXIETY 07/17/2008  . Chronic pain syndrome 02/14/2010  . Trigeminal neuralgia 02/14/2010  . HEARING LOSS, RIGHT EAR 06/04/2009  . HYPERTENSION 07/17/2008  . CHF 01/28/2009  . Pneumonia, organism unspecified 02/14/2010  . GERD 07/17/2008  . IBS 06/04/2009  . UNSPECIFIED HEPATITIS 01/28/2009  . RENAL INSUFFICIENCY 10/31/2009  . MENOPAUSAL DISORDER 12/03/2009  . Cellulitis and abscess of leg, except foot 11/15/2008  . SKIN ULCER, CHRONIC 02/08/2009  . Rheumatoid arthritis(714.0) 07/17/2008  . SKIN LESION 06/04/2009  . ARTHRITIS 01/28/2009  . FOOT PAIN 07/17/2008  . HYPERSOMNIA 02/14/2010  . Altered mental status 02/07/2010  . PERIPHERAL EDEMA 08/30/2008  . Wheezing 12/03/2009  . ABDOMINAL DISTENSION 10/11/2008  . Dysuria 11/11/2009  . Hypoxemia 03/20/2010  . ANAPHYLACTIC SHOCK 05/15/2009  . NEUROPATHY, HX  OF 01/28/2009  . COLONIC POLYPS, HX OF 07/17/2008  . AMPUTATION, BELOW KNEE, RIGHT, HX OF 01/28/2009  . Hyperlipemia 09/24/2010  . Hyperlipidemia 09/24/2010  . Seizure (HCC) 10/26/2010  . Pneumonia, aspiration (HCC) 12/01/2011    Episode July 2013, tx per [facility]  . Osteoporosis 11/16/2014   BP 127/77 mmHg  Pulse 69  SpO2 97%  Opioid Risk Score:   Fall Risk Score:  `1  Depression screen PHQ 2/9  Depression screen [facility] 2/9 02/25/2015 02/12/2015 01/15/2015 12/19/2014 11/20/2014 10/26/2014 09/27/2014  Decreased Interest 1 0 0 0 0 0 0  Down, Depressed, Hopeless 1 0 0 0 0 0 0  PHQ - 2 Score 2 0 0 0 0 0 0  Altered sleeping 3 - - - - - 1  Tired, decreased energy 3 - - - - - 0  Change in appetite - - - - - - 1    Feeling bad or failure about yourself  - - - - - - 1  Trouble concentrating 0 - - - - - 0  Moving slowly or fidgety/restless 0 - - - - - 0  Suicidal thoughts 0 - - - - - 0  PHQ-9 Score 8 - - - - - 3  Difficult doing work/chores Not difficult at all - - - - - -      Review of Systems  All other systems reviewed and are negative.      Objective:   Physical Exam  Constitutional: She appears well-developed and well-nourished. She has lost weight  HENT: some facial tenderness and hypertensitivity  Head: Normocephalic and atraumatic.  Nose: Nose normal.  Mouth/Throat: Oropharynx is clear and moist.  Eyes: Conjunctivae are normal.  Neck: Normal range of motion. Neck supple.  Cardiovascular: Normal rate and regular rhythm.  Pulmonary/Chest: No respiratory distress. She has no wheezes.  Musculoskeletal:  Edema gone RLE, 1+LLE. Chronic stasis changes are noted on the left leg still. Left distal leg warm, with increased erythema. Neurological: A sensory deficit (left foot), right leg to PP and LT. Had difficulty with shoulder abduction past 90 degrees, 3/5 strength, biceps,triceps 4/5 on left as well.  Psychiatric: She has a normal mood and affect. Her behavior is normal. Judgment and thought content normal.  W/c is in disrepair. Right front wheel is damaged. Seat cushion and arms rests are cracking/falling apart.  Assessment & Plan:   ASSESSMENT:  1. Right below-knee amputation, history of phantom limb pain.  2. Peripheral neuropathy.  3. History of rheumatoid arthritis.  4. Osteoarthritis of left knee-  5. Chronic cellulitis, left leg with associated edema. Acute case today noted left ankle/leg 6. Left patella fx (per pt) after fall at home 2 weeks ago  7. 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 Osteoporosis by DEXA scan last month.   PLAN:  1. No synvisc today. ?cellulitis left leg.  2. Reviewed stroke  recovery and timecourse 3. Maintain gabapentin at TID with QID for flare, watching closely for edema.  4. Continue  pain medications   including Percocet 10/325, #120  and fentanyl 75 mcg #10.  5. Trial of keflex for left leg cellulitis---she will see pcp on Thursday.  6. She will follow up with my NP in one month. 15 minutes of face to face patient care time were spent during this visit. All questions were encouraged and answered.            Assessment &  Plan:

## 2015-03-26 NOTE — Patient Outreach (Signed)
[facility]) Care Management  03/26/2015  Lashondra Vaquerano 09-11-1945 [id number]   Notification from disciplines involved to close case due to patient refused [facility] Care Management services.  Thanks, [name]. [name] [facility] Care Management [facility] CM Assistant Phone: [phone] Fax: [phone]

## 2015-03-27 ENCOUNTER — Ambulatory Visit: Payer: Medicare Other | Admitting: Internal Medicine

## 2015-03-28 ENCOUNTER — Encounter: Payer: Self-pay | Admitting: Internal Medicine

## 2015-03-28 ENCOUNTER — Other Ambulatory Visit (INDEPENDENT_AMBULATORY_CARE_PROVIDER_SITE_OTHER): Payer: Medicare Other

## 2015-03-28 ENCOUNTER — Ambulatory Visit (INDEPENDENT_AMBULATORY_CARE_PROVIDER_SITE_OTHER): Payer: Medicare Other | Admitting: Internal Medicine

## 2015-03-28 VITALS — BP 118/70 | HR 76 | Temp 98.9°F | Resp 20 | Ht 70.0 in | Wt 234.0 lb

## 2015-03-28 DIAGNOSIS — N183 Chronic kidney disease, stage 3 (moderate): Secondary | ICD-10-CM | POA: Diagnosis not present

## 2015-03-28 DIAGNOSIS — I10 Essential (primary) hypertension: Secondary | ICD-10-CM

## 2015-03-28 DIAGNOSIS — E1159 Type 2 diabetes mellitus with other circulatory complications: Secondary | ICD-10-CM

## 2015-03-28 DIAGNOSIS — I639 Cerebral infarction, unspecified: Secondary | ICD-10-CM | POA: Insufficient documentation

## 2015-03-28 DIAGNOSIS — I6350 Cerebral infarction due to unspecified occlusion or stenosis of unspecified cerebral artery: Secondary | ICD-10-CM

## 2015-03-28 LAB — CBC WITH DIFFERENTIAL/PLATELET
Basophils Absolute: 0 10*3/uL (ref 0.0–0.1)
Basophils Relative: 0.7 % (ref 0.0–3.0)
Eosinophils Absolute: 0.4 10*3/uL (ref 0.0–0.7)
Eosinophils Relative: 5.7 % — ABNORMAL HIGH (ref 0.0–5.0)
HEMATOCRIT: 33.9 % — AB (ref 36.0–46.0)
Hemoglobin: 11.1 g/dL — ABNORMAL LOW (ref 12.0–15.0)
LYMPHS PCT: 39 % (ref 12.0–46.0)
Lymphs Abs: 2.5 10*3/uL (ref 0.7–4.0)
MCHC: 32.7 g/dL (ref 30.0–36.0)
MCV: 93.8 fl (ref 78.0–100.0)
MONO ABS: 0.5 10*3/uL (ref 0.1–1.0)
Monocytes Relative: 7.2 % (ref 3.0–12.0)
NEUTROS ABS: 3 10*3/uL (ref 1.4–7.7)
Neutrophils Relative %: 47.4 % (ref 43.0–77.0)
Platelets: 191 10*3/uL (ref 150.0–400.0)
RBC: 3.61 Mil/uL — ABNORMAL LOW (ref 3.87–5.11)
RDW: 13.5 % (ref 11.5–15.5)
WBC: 6.4 10*3/uL (ref 4.0–10.5)

## 2015-03-28 LAB — HEMOGLOBIN A1C: Hgb A1c MFr Bld: 5.6 % (ref 4.6–6.5)

## 2015-03-28 LAB — TSH: TSH: 3.76 u[IU]/mL (ref 0.35–4.50)

## 2015-03-28 LAB — BASIC METABOLIC PANEL
BUN: 19 mg/dL (ref 6–23)
CO2: 22 mEq/L (ref 19–32)
Calcium: 9.4 mg/dL (ref 8.4–10.5)
Chloride: 109 mEq/L (ref 96–112)
Creatinine, Ser: 1.23 mg/dL — ABNORMAL HIGH (ref 0.40–1.20)
GFR: 45.72 mL/min — ABNORMAL LOW (ref >60.00)
GLUCOSE: 85 mg/dL (ref 70–99)
POTASSIUM: 4.8 meq/L (ref 3.5–5.1)
SODIUM: 139 meq/L (ref 135–145)

## 2015-03-28 LAB — LIPID PANEL
CHOL/HDL RATIO: 5
CHOLESTEROL: 198 mg/dL (ref 0–200)
HDL: 40.8 mg/dL (ref >39.00)
LDL Cholesterol: 123 mg/dL — ABNORMAL HIGH (ref 0–99)
NONHDL: 156.78
Triglycerides: 168 mg/dL — ABNORMAL HIGH (ref 0.0–149.0)
VLDL: 33.6 mg/dL (ref 0.0–40.0)

## 2015-03-28 LAB — HEPATIC FUNCTION PANEL
ALT: 7 U/L (ref 0–35)
AST: 11 U/L (ref 0–37)
Albumin: 4.1 g/dL (ref 3.5–5.2)
Alkaline Phosphatase: 81 U/L (ref 39–117)
BILIRUBIN DIRECT: 0 mg/dL (ref 0.0–0.3)
TOTAL PROTEIN: 7.5 g/dL (ref 6.0–8.3)
Total Bilirubin: 0.3 mg/dL (ref 0.2–1.2)

## 2015-03-28 MED ORDER — SOLIFENACIN SUCCINATE 5 MG PO TABS: 5.0000 mg | ORAL_TABLET | Freq: Every day | ORAL | Status: DC

## 2015-03-28 MED ORDER — CLOPIDOGREL BISULFATE 75 MG PO TABS: 75.0000 mg | ORAL_TABLET | Freq: Every day | ORAL | Status: DC

## 2015-03-28 MED ORDER — FUROSEMIDE 40 MG PO TABS: ORAL_TABLET | ORAL | Status: DC

## 2015-03-28 MED ORDER — DIPHENOXYLATE-ATROPINE 2.5-0.025 MG PO TABS: ORAL_TABLET | ORAL | Status: DC

## 2015-03-28 MED ORDER — ATORVASTATIN CALCIUM 10 MG PO TABS: 10.0000 mg | ORAL_TABLET | Freq: Every day | ORAL | Status: DC

## 2015-03-28 NOTE — Assessment & Plan Note (Signed)
Likely had reactive HTN with cva, but with BP now settled, does not need to start the amlod 10 (she has not anyway, o/w stable overall by history and exam, recent data reviewed with pt, and pt to continue medical treatment as before,  to f/u any worsening symptoms or concerns BP Readings from Last 3 Encounters:  03/28/15 118/70  03/26/15 127/77  02/12/15 138/69

## 2015-03-28 NOTE — Assessment & Plan Note (Addendum)
Stable, ok to take the plavix and statin - pt reassured, also for cont'd risk factor mod, and refer Neurology - dr Pearlean Brownie in f/u  Note:  Total time for pt hx, exam, review of record with pt in the room, determination of diagnoses and plan for further eval and tx is > 40 min, with over 50% spent in coordination and counseling of patient

## 2015-03-28 NOTE — Progress Notes (Signed)
Pre visit review using our clinic review tool, if applicable. No additional management support is needed unless otherwise documented below in the visit note. 

## 2015-03-28 NOTE — Assessment & Plan Note (Signed)
Has had good control overall, no OHA, has lost signficant wt, ok for f/u labs today o/w stable overall by history and exam, recent data reviewed with pt, and pt to continue medical treatment as before,  to f/u any worsening symptoms or concerns Lab Results  Component Value Date   HGBA1C 6.4 07/25/2013

## 2015-03-28 NOTE — Assessment & Plan Note (Signed)
stable overall by history and exam, recent data reviewed with pt, and pt to continue medical treatment as before,  to f/u any worsening symptoms or concerns Lab Results  Component Value Date   CREATININE 1.3* 10/24/2013

## 2015-03-28 NOTE — Patient Instructions (Addendum)
Please continue to monitor your Blood Pressure at home, with the goal of < 140/90.  OK to NOT take the amlodipine 10 mg per day  Please take all new medication as prescribed - the vesicare 5 mg per day for bladder  Please start the atorvastatin 10 mg and the plavix 75 mg  Please take all new medication as prescribed  - the vesicare  Please continue all other medications as before, and refills have been done if requested.  Please have the pharmacy call with any other refills you may need.  Please continue your efforts at being more active, low cholesterol diet, and weight control.  You will be contacted regarding the referral for: Neurology  Please keep your appointments with your specialists as you may have planned  Please go to the LAB in the Basement (turn left off the elevator) for the tests to be done today  You will be contacted by phone if any changes need to be made immediately.  Otherwise, you will receive a letter about your results with an explanation, but please check with MyChart first.  Please remember to sign up for MyChart if you have not done so, as this will be important to you in the future with finding out test results, communicating by private email, and scheduling acute appointments online when needed.  Please return in 6 months, or sooner if needed

## 2015-03-28 NOTE — Progress Notes (Signed)
Subjective:    Patient ID: Crystal Fitzpatrick, female    DOB: Dec 31, 1945, 69 y.o.   MRN: [id number]  HPI  Here for yearly f/u;  Overall doing ok but did have Acute stroke 3 wks ago at [facility] with LUE mild residual only;  Pt denies Chest pain, worsening SOB, DOE, wheezing, orthopnea, PND, worsening LE edema, palpitations, dizziness or syncope.  Pt denies neurological change such as new headache, facial or extremity weakness.  Pt denies polydipsia, polyuria, or low sugar symptoms. Pt states overall good compliance with treatment and medications, good tolerability, and has been trying to follow appropriate diet.  Pt denies worsening depressive symptoms, suicidal ideation or panic. No fever, night sweats, wt loss, loss of appetite, or other constitutional symptoms.  Pt states good ability with ADL's, has high fall risk, home safety reviewed and adequate, no other significant changes in hearing or vision, and only occasionally active with exercise.  Lost over 40 lbs in the past yr with illness and increased activity Wt Readings from Last 3 Encounters:  03/28/15 234 lb (106.142 kg)  02/09/14 279 lb (126.554 kg)  11/16/13 279 lb (126.554 kg)  med list from [facility] includes amlod 10 qd, liptior 10, plavix 75 in addition to prior home meds, but she did not take any of it until she talked to me today. Also mentions daily 1 or more wetting accidents with urge symtpoms, but recent UA neg for infection at last hospn. Past Medical History  Diagnosis Date  . DIABETES MELLITUS, TYPE II 07/17/2008  . B12 DEFICIENCY 07/17/2008  . Acute gouty arthropathy 12/11/2008  . HYPONATREMIA 03/20/2010  . HYPERKALEMIA 10/31/2009  . ANEMIA-NOS 07/17/2008  . ANXIETY 07/17/2008  . Chronic pain syndrome 02/14/2010  . Trigeminal neuralgia 02/14/2010  . HEARING LOSS, RIGHT EAR 06/04/2009  . HYPERTENSION 07/17/2008  . CHF 01/28/2009  . Pneumonia, organism unspecified 02/14/2010  . GERD 07/17/2008  . IBS 06/04/2009  . UNSPECIFIED HEPATITIS  01/28/2009  . RENAL INSUFFICIENCY 10/31/2009  . MENOPAUSAL DISORDER 12/03/2009  . Cellulitis and abscess of leg, except foot 11/15/2008  . SKIN ULCER, CHRONIC 02/08/2009  . Rheumatoid arthritis(714.0) 07/17/2008  . SKIN LESION 06/04/2009  . ARTHRITIS 01/28/2009  . FOOT PAIN 07/17/2008  . HYPERSOMNIA 02/14/2010  . Altered mental status 02/07/2010  . PERIPHERAL EDEMA 08/30/2008  . Wheezing 12/03/2009  . ABDOMINAL DISTENSION 10/11/2008  . Dysuria 11/11/2009  . Hypoxemia 03/20/2010  . ANAPHYLACTIC SHOCK 05/15/2009  . NEUROPATHY, HX OF 01/28/2009  . COLONIC POLYPS, HX OF 07/17/2008  . AMPUTATION, BELOW KNEE, RIGHT, HX OF 01/28/2009  . Hyperlipemia 09/24/2010  . Hyperlipidemia 09/24/2010  . Seizure ([place]) 10/26/2010  . Pneumonia, aspiration ([place]) 12/01/2011    Episode July 2013, tx per [place]  . Osteoporosis 11/16/2014   Past Surgical History  Procedure Laterality Date  . Amputation  02/02/2008    below right knee  . S/p egd  2007    with Botox for? Achalasia  . Tonsillectomy    . S/p breast biopsy  1992  . 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 Tubal ligation    . Nasal septum surgery    . Foot surgury  1980 and 1981    reports that she has quit smoking. She has never used smokeless tobacco. She reports that she does not drink alcohol or use illicit drugs. family history includes Breast cancer in her sister; Diabetes in her other; Stroke in her other.  Allergies  Allergen Reactions  . Morphine Anaphylaxis  . Sulfonamide Derivatives Diarrhea  . Ciprofloxacin Diarrhea  . Clindamycin/Lincomycin Diarrhea  . Doxycycline     Nausea, diarrhea  . Lyrica [Pregabalin]   . Nitrofurantoin     REACTION: itch  . Prednisone     Fast heart rate  . Erythromycin Rash   Current Outpatient Prescriptions on File Prior to Visit  Medication Sig Dispense Refill  . albuterol (PROAIR HFA) 108 (90 BASE) MCG/ACT inhaler Inhale 2 puffs into the lungs every 6 (six) hours as needed.      [name]  alendronate (FOSAMAX) 70 MG tablet Take 1 tablet (70 mg total) by mouth every 7 (seven) days. Take with a full glass of water on an empty stomach. 12 tablet 3  . ALPRAZolam (XANAX) 0.25 MG tablet TAKE ONE (1) TABLET THREE (3) TIMES EACHDAY AS NEEDED 90 tablet 2  . Ascorbic Acid (VITAMIN C) 500 MG tablet Take 500 mg by mouth daily.      . Blood Glucose Monitoring Suppl (ONE TOUCH ULTRA 2) W/DEVICE KIT 1 Device by Does not apply route once. 1 each 0  . cephALEXin (KEFLEX) 500 MG capsule Take 1 capsule (500 mg total) by mouth 3 (three) times daily. For 10 days 30 capsule 0  . cyclobenzaprine (FLEXERIL) 10 MG tablet TAKE 1 TABLET BY MOUTH EVERY 8 HOURS AS NEEDED 90 tablet 3  . diclofenac sodium (VOLTAREN) 1 % GEL Apply 2 g topically 4 (four) times daily. 3 Tube 3  . diphenoxylate-atropine (LOMOTIL) 2.5-0.025 MG per tablet TAKE 1 TABLET BY MOUTH 2 TIMES A DAY AS NEEDED 60 tablet 1  . DULoxetine (CYMBALTA) 60 MG capsule TAKE 2 CAPSULES BY MOUTH DAILY 60 capsule 3  . EPINEPHrine 0.3 mg/0.3 mL IJ SOAJ injection Inject 0.3 mLs (0.3 mg total) into the muscle once. 1 Device 2  . fentaNYL (DURAGESIC - DOSED MCG/HR) 75 MCG/HR Place 1 patch (75 mcg total) onto the skin every 3 (three) days. 1 patch every 3 days 10 patch 0  . furosemide (LASIX) 40 MG tablet TAKE ONE (1) TABLET EACH DAY 90 tablet 3  . gabapentin (NEURONTIN) 600 MG tablet Take 1 tablet (600 mg total) by mouth 3 (three) times daily. 90 tablet 3  . meclizine (ANTIVERT) 25 MG tablet Take 1 tablet (25 mg total) by mouth 3 (three) times daily as needed. 30 tablet 0  . Multiple Vitamins-Minerals (CENTRUM SILVER ULTRA WOMENS PO) Take by mouth daily.      [name] olmesartan (BENICAR) 20 MG tablet Take 20 mg by mouth daily. Patient reports she is no longer taking the $RemoveBe'40mg'DPeSbuNKi$  benicar tablets. States her doctor changed her dosage to 20 mg tablets.    [name] omega-3 acid ethyl esters (LOVAZA) 1 G capsule Take 1 capsule (1 g total) by mouth daily. 90 capsule 3  . ONETOUCH  DELICA LANCETS MISC Use as directed to test blood sugar dx 250.00 100 each 2  . oxyCODONE-acetaminophen (PERCOCET) 10-325 MG tablet Take 1 tablet by mouth every 6 (six) hours as needed for pain. 120 tablet 0  . promethazine (PHENERGAN) 25 MG tablet Take 1 tablet (25 mg total) by mouth every 6 (six) hours as needed. for nausea 60 tablet 1  . Psyllium (METAMUCIL) 30.9 % POWD Take by mouth. Take as directed     . topiramate (TOPAMAX) 100 MG tablet TAKE 3 TABLETS BY MOUTH EACH NIGHT AT BEDTIME 90 tablet 1  . triamcinolone (NASACORT AQ) 55 MCG/ACT AERO nasal inhaler Place  2 sprays into the nose daily. 1 Inhaler 12   No current facility-administered medications on file prior to visit.     Review of Systems Constitutional: Negative for increased diaphoresis, other activity, appetite or siginficant weight change other than noted HENT: Negative for worsening hearing loss, ear pain, facial swelling, mouth sores and neck stiffness.   Eyes: Negative for other worsening pain, redness or visual disturbance.  Respiratory: Negative for shortness of breath and wheezing  Cardiovascular: Negative for chest pain and palpitations.  Gastrointestinal: Negative for diarrhea, blood in stool, abdominal distention or other pain Genitourinary: Negative for hematuria, flank pain or change in urine volume.  Musculoskeletal: Negative for myalgias or other joint complaints.  Skin: Negative for color change and wound or drainage.  Neurological: Negative for syncope and numbness. other than noted Hematological: Negative for adenopathy. or other swelling Psychiatric/Behavioral: Negative for hallucinations, SI, self-injury, decreased concentration or other worsening agitation.      Objective:   Physical Exam BP 118/70 mmHg  Pulse 76  Temp(Src) 98.9 F (37.2 C) (Oral)  Resp 20  Ht 5' 10" (1.778 m)  Wt 234 lb (106.142 kg)  BMI 33.58 kg/m2  SpO2 97% VS noted,  Constitutional: Pt is oriented to person, place, and  time. Appears well-developed and well-nourished, in no significant distress Head: Normocephalic and atraumatic.  Right Ear: External ear normal.  Left Ear: External ear normal.  Nose: Nose normal.  Mouth/Throat: Oropharynx is clear and moist.  Eyes: Conjunctivae and EOM are normal. Pupils are equal, round, and reactive to light.  Neck: Normal range of motion. Neck supple. No JVD present. No tracheal deviation present or significant neck LA or mass Cardiovascular: Normal rate, regular rhythm, normal heart sounds and intact distal pulses.   Pulmonary/Chest: Effort normal and breath sounds without rales or wheezing  Abdominal: Soft. Bowel sounds are normal. NT. No HSM  Musculoskeletal: Normal range of motion. Exhibits no edema.  Lymphadenopathy:  Has no cervical adenopathy.  Neurological: Pt is alert and oriented to person, place, and time. Pt has normal reflexes. No cranial nerve deficit. Motor grossly intact except LUE 4+ /5 Skin: Skin is warm and dry. No rash noted. , s/p right bka Psychiatric:  Has mild nervous mood and affect. Behavior is normal.     Assessment & Plan:

## 2015-04-04 ENCOUNTER — Other Ambulatory Visit: Payer: Self-pay | Admitting: Internal Medicine

## 2015-04-05 NOTE — Telephone Encounter (Signed)
Done erx 

## 2015-04-08 ENCOUNTER — Telehealth: Payer: Self-pay

## 2015-04-08 ENCOUNTER — Ambulatory Visit: Payer: Medicare Other | Admitting: Physical Medicine & Rehabilitation

## 2015-04-08 NOTE — Telephone Encounter (Signed)
OK for pt to make same day appt Tues Nov 22

## 2015-04-08 NOTE — Telephone Encounter (Signed)
Called the patient and LVM that Dr. Jonny Ruiz would review tomorrow when he returns but if she needs further help tonight, may need to go to urgent care where she can be seen, evaluated and treated.

## 2015-04-08 NOTE — Telephone Encounter (Signed)
This patient was seen on the 11/10  by Dr. Jonny Ruiz; The patient was also on 11/8 by  Dr. Riley Kill who started keflex for cellulitus.   States on the 10th,  DR. John looked at her ear and it was red, but felt the Keflex would cover; Now calling and LVM c/o of ear ache continues and now c/o of dizziness and nausea;   Noted that Antivert has not been ordered since 2014; Stated to the patient dr. Jonny Ruiz may need to see her and evaluate since she was on keflex x 10 days without resolution. Requesting a trail of Antivert first prior to apt.  Routed to Dr. Jonny Ruiz Please advise ;

## 2015-04-09 ENCOUNTER — Ambulatory Visit: Payer: Medicare Other | Admitting: Family

## 2015-04-09 ENCOUNTER — Telehealth: Payer: Self-pay | Admitting: Physical Medicine & Rehabilitation

## 2015-04-09 NOTE — Telephone Encounter (Signed)
Patient just called to let us know she does not need a prescription for her patches.  She was given one last time she was in and they have it at the pharmacy.

## 2015-04-09 NOTE — Telephone Encounter (Signed)
Ok for next available, or to ER or UC if red/sweling much worse in the meantime

## 2015-04-09 NOTE — Telephone Encounter (Signed)
Patient called concerned that she was not going to make it to her appointment and that she needs refills on her patches.

## 2015-04-09 NOTE — Telephone Encounter (Signed)
noted 

## 2015-04-09 NOTE — Telephone Encounter (Signed)
Dr. Jonny Ruiz we are full today.  Should we look at tomorrow?

## 2015-04-09 NOTE — Telephone Encounter (Signed)
Spoke with patient.  We do not have any availability for tomorrow right now.  There are a couple late afternoon with Dr. Jonny Ruiz but patient does not want those.  I am going to follow up with patient to see if we can get schedule for Friday once that schedule opens tomorrow.  Did give patient MD response.

## 2015-04-12 ENCOUNTER — Encounter: Payer: Self-pay | Admitting: Internal Medicine

## 2015-04-12 ENCOUNTER — Ambulatory Visit (INDEPENDENT_AMBULATORY_CARE_PROVIDER_SITE_OTHER): Payer: Medicare Other | Admitting: Internal Medicine

## 2015-04-12 VITALS — BP 126/72 | HR 93 | Temp 98.7°F

## 2015-04-12 DIAGNOSIS — L03116 Cellulitis of left lower limb: Secondary | ICD-10-CM

## 2015-04-12 DIAGNOSIS — R42 Dizziness and giddiness: Secondary | ICD-10-CM

## 2015-04-12 DIAGNOSIS — I6350 Cerebral infarction due to unspecified occlusion or stenosis of unspecified cerebral artery: Secondary | ICD-10-CM | POA: Diagnosis not present

## 2015-04-12 DIAGNOSIS — N183 Chronic kidney disease, stage 3 (moderate): Secondary | ICD-10-CM

## 2015-04-12 DIAGNOSIS — I509 Heart failure, unspecified: Secondary | ICD-10-CM | POA: Diagnosis not present

## 2015-04-12 MED ORDER — FUROSEMIDE 40 MG PO TABS: ORAL_TABLET | ORAL | Status: AC

## 2015-04-12 MED ORDER — AMOXICILLIN-POT CLAVULANATE 875-125 MG PO TABS: 1.0000 | ORAL_TABLET | Freq: Two times a day (BID) | ORAL | Status: DC

## 2015-04-12 MED ORDER — MECLIZINE HCL 25 MG PO TABS: 25.0000 mg | ORAL_TABLET | Freq: Three times a day (TID) | ORAL | Status: DC | PRN

## 2015-04-12 NOTE — Assessment & Plan Note (Addendum)
With nausea, for antivert prn, also augmentin , cont allergy meds as well that may be contributing to right > left eustachian type symptoms  Note:  Total time for pt hx, exam, review of record with pt in the room, determination of diagnoses and plan for further eval and tx is > 40 min, with over 50% spent in coordination and counseling of patient

## 2015-04-12 NOTE — Assessment & Plan Note (Signed)
Mild recurrent today, intolerant to a number of antibx, for augmentin course asd,  to f/u any worsening symptoms or concerns, may help with upper resp infectoius symptoms as well

## 2015-04-12 NOTE — Assessment & Plan Note (Signed)
stable overall by history and exam, recent data reviewed with pt, and pt to continue medical treatment as before,  to f/u any worsening symptoms or concerns Lab Results  Component Value Date   CREATININE 1.23* 03/28/2015   For fu next visit

## 2015-04-12 NOTE — Progress Notes (Signed)
Pre visit review using our clinic review tool, if applicable. No additional management support is needed unless otherwise documented below in the visit note. 

## 2015-04-12 NOTE — Patient Instructions (Signed)
Please take all new medication as prescribed - the augmentin  OK to increase the fluid pill (furosemide) to a whole pill every day (40 mg)  Please continue all other medications as before, and refills have been done if requested - the antivert for dizziness  Please have the pharmacy call with any other refills you may need.  Please keep your appointments with your specialists as you may have planned

## 2015-04-12 NOTE — Assessment & Plan Note (Signed)
Has had some renal insuff in past with increased lasix, but has persistent edema, possibly contributing to recurring LLE infections, for increased lasix 40 qd, f/u labs at next visit

## 2015-04-12 NOTE — Progress Notes (Signed)
Subjective:    Patient ID: Crystal Fitzpatrick, female    DOB: Sep 27, 1945, 69 y.o.   MRN: [id number]  HPI     Here with 2-3 days acute onset fever, right ear pain, facial pain, pressure, headache, general weakness and malaise, and greenish d/c, with mild ST and cough and recurring vertigo with nausea,, but pt denies chest pain, wheezing, increased sob or doe, orthopnea, PND, increased LE swelling, palpitations, or syncope.  Has lost significant wt over the past yr, she states mostly with better diet. Pt denies new neurological symptoms such as new headache, or facial or extremity weakness or numbness   Pt denies polydipsia, polyuria, or low sugar symptoms such as weakness or confusion improved with po intake.  Pt states overall good compliance with meds, trying to follow lower cholesterol, diabetic diet. Left leg largely improved, but in past 2 days with a small but signficant area with worsening redness/heat again.  Take the lasix 20 mg daily, but does not seem to be controlling the persistent LLE edema.  S/p right BKA Wt Readings from Last 3 Encounters:  03/28/15 234 lb (106.142 kg)  02/09/14 279 lb (126.554 kg)  11/16/13 279 lb (126.554 kg)   Past Medical History  Diagnosis Date  . DIABETES MELLITUS, TYPE II 07/17/2008  . B12 DEFICIENCY 07/17/2008  . Acute gouty arthropathy 12/11/2008  . HYPONATREMIA 03/20/2010  . HYPERKALEMIA 10/31/2009  . ANEMIA-NOS 07/17/2008  . ANXIETY 07/17/2008  . Chronic pain syndrome 02/14/2010  . Trigeminal neuralgia 02/14/2010  . HEARING LOSS, RIGHT EAR 06/04/2009  . HYPERTENSION 07/17/2008  . CHF 01/28/2009  . Pneumonia, organism unspecified 02/14/2010  . GERD 07/17/2008  . IBS 06/04/2009  . UNSPECIFIED HEPATITIS 01/28/2009  . RENAL INSUFFICIENCY 10/31/2009  . MENOPAUSAL DISORDER 12/03/2009  . Cellulitis and abscess of leg, except foot 11/15/2008  . SKIN ULCER, CHRONIC 02/08/2009  . Rheumatoid arthritis(714.0) 07/17/2008  . SKIN LESION 06/04/2009  . ARTHRITIS 01/28/2009  . FOOT PAIN  07/17/2008  . HYPERSOMNIA 02/14/2010  . Altered mental status 02/07/2010  . PERIPHERAL EDEMA 08/30/2008  . Wheezing 12/03/2009  . ABDOMINAL DISTENSION 10/11/2008  . Dysuria 11/11/2009  . Hypoxemia 03/20/2010  . ANAPHYLACTIC SHOCK 05/15/2009  . NEUROPATHY, HX OF 01/28/2009  . COLONIC POLYPS, HX OF 07/17/2008  . AMPUTATION, BELOW KNEE, RIGHT, HX OF 01/28/2009  . Hyperlipemia 09/24/2010  . Hyperlipidemia 09/24/2010  . Seizure ([place]) 10/26/2010  . Pneumonia, aspiration ([place]) 12/01/2011    Episode July 2013, tx per [place]  . Osteoporosis 11/16/2014   Past Surgical History  Procedure Laterality Date  . Amputation  02/02/2008    below right knee  . S/p egd  2007    with Botox for? Achalasia  . Tonsillectomy    . S/p breast biopsy  1992  . 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 Tubal ligation    . Nasal septum surgery    . Foot surgury  1980 and 1981    reports that she has quit smoking. She has never used smokeless tobacco. She reports that she does not drink alcohol or use illicit drugs. family history includes Breast cancer in her sister; Diabetes in her other; Stroke in her other. Allergies  Allergen Reactions  . Morphine Anaphylaxis  . Sulfonamide Derivatives Diarrhea  . Ciprofloxacin Diarrhea  . Clindamycin/Lincomycin Diarrhea  . Doxycycline     Nausea, diarrhea  . Lyrica [Pregabalin]   . Nitrofurantoin     REACTION: itch  .  Prednisone     Fast heart rate  . Erythromycin Rash  ' Current Outpatient Prescriptions on File Prior to Visit  Medication Sig Dispense Refill  . albuterol (PROAIR HFA) 108 (90 BASE) MCG/ACT inhaler Inhale 2 puffs into the lungs every 6 (six) hours as needed.      [name] alendronate (FOSAMAX) 70 MG tablet Take 1 tablet (70 mg total) by mouth every 7 (seven) days. Take with a full glass of water on an empty stomach. 12 tablet 3  . ALPRAZolam (XANAX) 0.25 MG tablet TAKE ONE (1) TABLET THREE (3) TIMES EACHDAY AS NEEDED 90 tablet 2  . Ascorbic  Acid (VITAMIN C) 500 MG tablet Take 500 mg by mouth daily.      [name] atorvastatin (LIPITOR) 10 MG tablet Take 1 tablet (10 mg total) by mouth daily. 90 tablet 3  . BENICAR 40 MG tablet Take 20 mg by mouth daily.     . Blood Glucose Monitoring Suppl (ONE TOUCH ULTRA 2) W/DEVICE KIT 1 Device by Does not apply route once. 1 each 0  . clopidogrel (PLAVIX) 75 MG tablet Take 1 tablet (75 mg total) by mouth daily. 90 tablet 3  . cyclobenzaprine (FLEXERIL) 10 MG tablet TAKE 1 TABLET BY MOUTH EVERY 8 HOURS AS NEEDED 90 tablet 3  . diclofenac sodium (VOLTAREN) 1 % GEL Apply 2 g topically 4 (four) times daily. 3 Tube 3  . diphenoxylate-atropine (LOMOTIL) 2.5-0.025 MG tablet TAKE 1 TABLET BY MOUTH 2 TIMES A DAY AS NEEDED 60 tablet 1  . DULoxetine (CYMBALTA) 60 MG capsule TAKE 2 CAPSULES BY MOUTH DAILY 60 capsule 3  . EPINEPHrine 0.3 mg/0.3 mL IJ SOAJ injection Inject 0.3 mLs (0.3 mg total) into the muscle once. 1 Device 2  . fentaNYL (DURAGESIC - DOSED MCG/HR) 75 MCG/HR Place 1 patch (75 mcg total) onto the skin every 3 (three) days. 1 patch every 3 days 10 patch 0  . gabapentin (NEURONTIN) 600 MG tablet Take 1 tablet (600 mg total) by mouth 3 (three) times daily. 90 tablet 3  . Multiple Vitamins-Minerals (CENTRUM SILVER ULTRA WOMENS PO) Take by mouth daily.      [name] olmesartan (BENICAR) 20 MG tablet Take 20 mg by mouth daily. Patient reports she is no longer taking the 31m benicar tablets. States her doctor changed her dosage to 20 mg tablets.    .Marland Kitchenomega-3 acid ethyl esters (LOVAZA) 1 G capsule Take 1 capsule (1 g total) by mouth daily. 90 capsule 3  . ONETOUCH DELICA LANCETS MISC Use as directed to test blood sugar dx 250.00 100 each 2  . oxyCODONE-acetaminophen (PERCOCET) 10-325 MG tablet Take 1 tablet by mouth every 6 (six) hours as needed for pain. 120 tablet 0  . promethazine (PHENERGAN) 25 MG tablet Take 1 tablet (25 mg total) by mouth every 6 (six) hours as needed. for nausea 60 tablet 1  . Psyllium  (METAMUCIL) 30.9 % POWD Take by mouth. Take as directed     . solifenacin (VESICARE) 5 MG tablet Take 1 tablet (5 mg total) by mouth daily. 90 tablet 3  . topiramate (TOPAMAX) 100 MG tablet TAKE 3 TABLETS BY MOUTH EVERY NIGHT AT BEDTIME 90 tablet 3  . triamcinolone (NASACORT AQ) 55 MCG/ACT AERO nasal inhaler Place 2 sprays into the nose daily. 1 Inhaler 12   No current facility-administered medications on file prior to visit.     Review of Systems  Constitutional: Negative for unusual diaphoresis or night sweats HENT: Negative for ringing  in ear or discharge Eyes: Negative for double vision or worsening visual disturbance.  Respiratory: Negative for choking and stridor.   Gastrointestinal: Negative for vomiting or other signifcant bowel change Genitourinary: Negative for hematuria or change in urine volume.  Musculoskeletal: Negative for other MSK pain or swelling Skin: Negative for color change and worsening wound.  Neurological: Negative for tremors and numbness other than noted  Psychiatric/Behavioral: Negative for decreased concentration or agitation other than above       Objective:   Physical Exam BP 126/72 mmHg  Pulse 93  Temp(Src) 98.7 F (37.1 C) (Oral)  SpO2 97% VS noted, mild ill Constitutional: Pt appears in no significant distress HENT: Head: NCAT.  Right Ear: External ear normal. Right TM with marked erythema, mild bulging. + light reflex Left Ear: External ear normal. Left tm mild erythema, slight bulging Eyes: . Pupils are equal, round, and reactive to light. Conjunctivae and EOM are normal Neck: Normal range of motion. Neck supple.  Cardiovascular: Normal rate and regular rhythm.   Pulmonary/Chest: Effort normal and breath sounds without rales or wheezing.  Neurological: Pt is alert. Not confused , motor grossly intact Skin: Skin is warm. No rash, 2+Left LE edema, 3 cm area mid left ant leg with redness/warmth, mild swelling without ulcer/drainage Psychiatric:  Pt behavior is normal. No agitation.     Assessment & Plan:

## 2015-04-15 ENCOUNTER — Telehealth: Payer: Self-pay | Admitting: Internal Medicine

## 2015-04-16 NOTE — Telephone Encounter (Signed)
Both rx - Done hardcopy to [facility]

## 2015-04-19 NOTE — Telephone Encounter (Signed)
Notified pt with md response. shen stated her leg really hasn't responded to the augmentin, but she will finish. If leg still the same will call md to get his advise...Raechel Chute

## 2015-04-19 NOTE — Telephone Encounter (Signed)
Ok to finish the augmentin only as long as there is no diarrhea, rash or other side effect

## 2015-04-19 NOTE — Telephone Encounter (Signed)
Received call pt states she received another antibiotic for Clindamycin on 04/15/15. She is not sure y med was call in. MD rx Augmentin on 11/20. Needing to know if she suppose to take....Crystal Fitzpatrick

## 2015-04-26 ENCOUNTER — Other Ambulatory Visit: Payer: Self-pay | Admitting: Internal Medicine

## 2015-04-26 NOTE — Telephone Encounter (Signed)
Rx faxed to pharmacy  

## 2015-04-26 NOTE — Telephone Encounter (Signed)
Done hardcopy to Dahlia  

## 2015-04-29 ENCOUNTER — Other Ambulatory Visit: Payer: Self-pay | Admitting: *Deleted

## 2015-04-29 MED ORDER — CYCLOBENZAPRINE HCL 10 MG PO TABS: ORAL_TABLET | ORAL | Status: DC

## 2015-05-02 ENCOUNTER — Encounter: Payer: Self-pay | Admitting: Registered Nurse

## 2015-05-02 ENCOUNTER — Other Ambulatory Visit: Payer: Self-pay | Admitting: Registered Nurse

## 2015-05-02 ENCOUNTER — Encounter: Payer: Medicare Other | Attending: Physical Medicine and Rehabilitation | Admitting: Registered Nurse

## 2015-05-02 VITALS — BP 145/74 | HR 69

## 2015-05-02 DIAGNOSIS — Z79899 Other long term (current) drug therapy: Secondary | ICD-10-CM

## 2015-05-02 DIAGNOSIS — G894 Chronic pain syndrome: Secondary | ICD-10-CM | POA: Diagnosis not present

## 2015-05-02 DIAGNOSIS — M1712 Unilateral primary osteoarthritis, left knee: Secondary | ICD-10-CM

## 2015-05-02 DIAGNOSIS — Z89519 Acquired absence of unspecified leg below knee: Secondary | ICD-10-CM

## 2015-05-02 DIAGNOSIS — I6350 Cerebral infarction due to unspecified occlusion or stenosis of unspecified cerebral artery: Secondary | ICD-10-CM | POA: Diagnosis not present

## 2015-05-02 DIAGNOSIS — M179 Osteoarthritis of knee, unspecified: Secondary | ICD-10-CM | POA: Diagnosis not present

## 2015-05-02 DIAGNOSIS — G609 Hereditary and idiopathic neuropathy, unspecified: Secondary | ICD-10-CM

## 2015-05-02 DIAGNOSIS — M4716 Other spondylosis with myelopathy, lumbar region: Secondary | ICD-10-CM | POA: Diagnosis not present

## 2015-05-02 DIAGNOSIS — Z5181 Encounter for therapeutic drug level monitoring: Secondary | ICD-10-CM | POA: Diagnosis not present

## 2015-05-02 DIAGNOSIS — M069 Rheumatoid arthritis, unspecified: Secondary | ICD-10-CM | POA: Insufficient documentation

## 2015-05-02 DIAGNOSIS — L03116 Cellulitis of left lower limb: Secondary | ICD-10-CM

## 2015-05-02 MED ORDER — FENTANYL 75 MCG/HR TD PT72: 75.0000 ug | MEDICATED_PATCH | TRANSDERMAL | Status: DC

## 2015-05-02 MED ORDER — OXYCODONE-ACETAMINOPHEN 10-325 MG PO TABS: 1.0000 | ORAL_TABLET | Freq: Four times a day (QID) | ORAL | Status: DC | PRN
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
## 2015-05-02 NOTE — Telephone Encounter (Signed)
Pt ha called back and she did well on the augmentin and her leg was getting better and her infection has come back really bad. She is requesting another prescription for the augmentin. I have scheduled her for next Thurs so Dr. Jonny Ruiz can take another look at it.  Pharmacy is [facility]

## 2015-05-03 ENCOUNTER — Telehealth: Payer: Self-pay | Admitting: Internal Medicine

## 2015-05-03 ENCOUNTER — Other Ambulatory Visit: Payer: Self-pay | Admitting: Internal Medicine

## 2015-05-03 LAB — PMP ALCOHOL METABOLITE (ETG): ETGU: NEGATIVE ng/mL

## 2015-05-03 NOTE — Telephone Encounter (Signed)
Patient called to follow up on augmentin request that was denied. She has a cellulitis flare up. She advised that she is going to take an antibiotic that she has on hand already.

## 2015-05-03 NOTE — Telephone Encounter (Signed)
Agree with below; pt should be seen at [facility] to determine need for further antibx, ok any provider, and to go to ER for any fever, worsening pain, swelling and/or redness

## 2015-05-06 LAB — FENTANYL (GC/LC/MS), URINE
FENTANYL (GC/MS) CONFIRM: 40.1 ng/mL (ref <0.5)
Norfentanyl, confirm: 836.4 ng/mL (ref <0.5)

## 2015-05-06 LAB — OPIATES/OPIOIDS (LC/MS-MS)
CODEINE URINE: NEGATIVE ng/mL (ref <50)
Hydrocodone: NEGATIVE ng/mL (ref <50)
Hydromorphone: NEGATIVE ng/mL (ref <50)
MORPHINE: NEGATIVE ng/mL (ref <50)
NORHYDROCODONE, UR: NEGATIVE ng/mL (ref <50)
Noroxycodone, Ur: 6697 ng/mL (ref <50)
OXYCODONE, UR: 1363 ng/mL (ref <50)
OXYMORPHONE, URINE: 1905 ng/mL (ref <50)

## 2015-05-06 LAB — OXYCODONE, URINE (LC/MS-MS)
NOROXYCODONE, UR: 6697 ng/mL (ref <50)
Oxycodone, ur: 1363 ng/mL (ref <50)
Oxymorphone: 1905 ng/mL (ref <50)

## 2015-05-07 ENCOUNTER — Ambulatory Visit: Payer: Medicare Other | Admitting: Neurology

## 2015-05-07 LAB — PRESCRIPTION MONITORING PROFILE (SOLSTAS)
Amphetamine/Meth: NEGATIVE ng/mL
BARBITURATE SCREEN, URINE: NEGATIVE ng/mL
BENZODIAZEPINE SCREEN, URINE: NEGATIVE ng/mL
BUPRENORPHINE, URINE: NEGATIVE ng/mL
CREATININE, URINE: 141.01 mg/dL (ref >20.0)
Cannabinoid Scrn, Ur: NEGATIVE ng/mL
Carisoprodol, Urine: NEGATIVE ng/mL
Cocaine Metabolites: NEGATIVE ng/mL
MDMA URINE: NEGATIVE ng/mL
MEPERIDINE UR: NEGATIVE ng/mL
Methadone Screen, Urine: NEGATIVE ng/mL
NITRITES URINE, INITIAL: NEGATIVE ug/mL
PH URINE, INITIAL: 6.1 pH (ref 4.5–8.9)
Propoxyphene: NEGATIVE ng/mL
TAPENTADOLUR: NEGATIVE ng/mL
TRAMADOL UR: NEGATIVE ng/mL
ZOLPIDEM, URINE: NEGATIVE ng/mL

## 2015-05-07 NOTE — Telephone Encounter (Signed)
Ok  But stll recommend ROV as we normally dont tx cellulitis without a re-check occasionally (maybe not every time)

## 2015-05-08 ENCOUNTER — Other Ambulatory Visit: Payer: Self-pay | Admitting: Internal Medicine

## 2015-05-08 MED ORDER — AMOXICILLIN-POT CLAVULANATE 875-125 MG PO TABS: 1.0000 | ORAL_TABLET | Freq: Two times a day (BID) | ORAL | Status: DC

## 2015-05-08 NOTE — Telephone Encounter (Signed)
Received call pt states she was checking to see if md was going to go ahead and send Augmentin to her pharmacy. Inform pt with md response below. Pt has appt for tomorrow...Raechel Chute

## 2015-05-15 ENCOUNTER — Encounter: Payer: Self-pay | Admitting: Internal Medicine

## 2015-05-15 ENCOUNTER — Ambulatory Visit (INDEPENDENT_AMBULATORY_CARE_PROVIDER_SITE_OTHER): Payer: Medicare Other | Admitting: Internal Medicine

## 2015-05-15 ENCOUNTER — Ambulatory Visit (INDEPENDENT_AMBULATORY_CARE_PROVIDER_SITE_OTHER)
Admission: RE | Admit: 2015-05-15 | Discharge: 2015-05-15 | Disposition: A | Discharge status: Home / Self Care | Payer: Medicare Other | Source: Ambulatory Visit | Attending: Internal Medicine | Admitting: Internal Medicine

## 2015-05-15 VITALS — BP 144/88 | HR 89 | Temp 99.6°F

## 2015-05-15 DIAGNOSIS — M48061 Spinal stenosis, lumbar region without neurogenic claudication: Secondary | ICD-10-CM

## 2015-05-15 DIAGNOSIS — I6350 Cerebral infarction due to unspecified occlusion or stenosis of unspecified cerebral artery: Secondary | ICD-10-CM | POA: Diagnosis not present

## 2015-05-15 DIAGNOSIS — S8992XA Unspecified injury of left lower leg, initial encounter: Secondary | ICD-10-CM

## 2015-05-15 DIAGNOSIS — M7989 Other specified soft tissue disorders: Secondary | ICD-10-CM | POA: Diagnosis not present

## 2015-05-15 DIAGNOSIS — I10 Essential (primary) hypertension: Secondary | ICD-10-CM

## 2015-05-15 DIAGNOSIS — M4806 Spinal stenosis, lumbar region: Secondary | ICD-10-CM | POA: Diagnosis not present

## 2015-05-15 DIAGNOSIS — L03116 Cellulitis of left lower limb: Secondary | ICD-10-CM | POA: Diagnosis not present

## 2015-05-15 DIAGNOSIS — E1159 Type 2 diabetes mellitus with other circulatory complications: Secondary | ICD-10-CM

## 2015-05-15 HISTORY — DX: Spinal stenosis, lumbar region without neurogenic claudication: M48.061

## 2015-05-15 MED ORDER — OLMESARTAN MEDOXOMIL 20 MG PO TABS: 20.0000 mg | ORAL_TABLET | Freq: Every day | ORAL | Status: DC

## 2015-05-15 NOTE — Progress Notes (Signed)
Pre visit review using our clinic review tool, if applicable. No additional management support is needed unless otherwise documented below in the visit note. 

## 2015-05-15 NOTE — Assessment & Plan Note (Signed)
Improved by hx, minimal residual noted currently,  to f/u any worsening symptoms or concerns, finish antibx

## 2015-05-15 NOTE — Assessment & Plan Note (Signed)
stable overall by history and exam, recent data reviewed with pt, and pt to continue medical treatment as before,  to f/u any worsening symptoms or concerns Lab Results  Component Value Date   HGBA1C 5.6 03/28/2015
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
## 2015-05-15 NOTE — Patient Instructions (Signed)
Please continue all other medications as before, and refills have been done if requested.  Please have the pharmacy call with any other refills you may need.  Please continue your efforts at being more active, low cholesterol diet, and weight control.  You are otherwise up to date with prevention measures today.  Please keep your appointments with your specialists as you may have planned  Please go to the XRAY Department in the Basement (go straight as you get off the elevator) for the x-ray testing  You will be contacted by phone if any changes need to be made immediately.  Otherwise, you will receive a letter about your results with an explanation, but please check with MyChart first.  Please remember to sign up for MyChart if you have not done so, as this will be important to you in the future with finding out test results, communicating by private email, and scheduling acute appointments online when needed. 

## 2015-05-15 NOTE — Assessment & Plan Note (Addendum)
With large swelling possibly hematoma related distal lateral LLE above the ankle to mid leg, for left leg film, cont pain med as rx, no compartment syndrome, cont plavix for now

## 2015-05-15 NOTE — Assessment & Plan Note (Signed)
Mild elev today likely situational, cont same tx,  to f/u any worsening symptoms or concerns

## 2015-05-16 ENCOUNTER — Ambulatory Visit: Payer: Medicare Other | Admitting: Internal Medicine

## 2015-05-21 NOTE — Progress Notes (Signed)
Urine drug screen for this encounter is consistent for prescribed medication 

## 2015-05-23 ENCOUNTER — Other Ambulatory Visit: Payer: Self-pay | Admitting: Internal Medicine

## 2015-06-05 ENCOUNTER — Encounter: Payer: Medicare Other | Attending: Physical Medicine and Rehabilitation | Admitting: Registered Nurse

## 2015-06-05 ENCOUNTER — Encounter: Payer: Self-pay | Admitting: Nurse Practitioner

## 2015-06-05 ENCOUNTER — Encounter: Payer: Self-pay | Admitting: Registered Nurse

## 2015-06-05 ENCOUNTER — Ambulatory Visit (INDEPENDENT_AMBULATORY_CARE_PROVIDER_SITE_OTHER): Payer: Medicare Other | Admitting: Nurse Practitioner

## 2015-06-05 VITALS — BP 146/80 | HR 103 | Resp 14

## 2015-06-05 VITALS — BP 138/78 | HR 106 | Temp 99.3°F

## 2015-06-05 DIAGNOSIS — M4716 Other spondylosis with myelopathy, lumbar region: Secondary | ICD-10-CM | POA: Diagnosis not present

## 2015-06-05 DIAGNOSIS — Z7902 Long term (current) use of antithrombotics/antiplatelets: Secondary | ICD-10-CM | POA: Diagnosis not present

## 2015-06-05 DIAGNOSIS — Z8673 Personal history of transient ischemic attack (TIA), and cerebral infarction without residual deficits: Secondary | ICD-10-CM | POA: Diagnosis not present

## 2015-06-05 DIAGNOSIS — N189 Chronic kidney disease, unspecified: Secondary | ICD-10-CM | POA: Diagnosis not present

## 2015-06-05 DIAGNOSIS — E875 Hyperkalemia: Secondary | ICD-10-CM | POA: Diagnosis not present

## 2015-06-05 DIAGNOSIS — F419 Anxiety disorder, unspecified: Secondary | ICD-10-CM | POA: Diagnosis not present

## 2015-06-05 DIAGNOSIS — M1712 Unilateral primary osteoarthritis, left knee: Secondary | ICD-10-CM | POA: Diagnosis not present

## 2015-06-05 DIAGNOSIS — Z89519 Acquired absence of unspecified leg below knee: Secondary | ICD-10-CM | POA: Diagnosis not present

## 2015-06-05 DIAGNOSIS — Z881 Allergy status to other antibiotic agents status: Secondary | ICD-10-CM | POA: Diagnosis not present

## 2015-06-05 DIAGNOSIS — L03116 Cellulitis of left lower limb: Secondary | ICD-10-CM

## 2015-06-05 DIAGNOSIS — G609 Hereditary and idiopathic neuropathy, unspecified: Secondary | ICD-10-CM | POA: Diagnosis not present

## 2015-06-05 DIAGNOSIS — G894 Chronic pain syndrome: Secondary | ICD-10-CM

## 2015-06-05 DIAGNOSIS — Z885 Allergy status to narcotic agent status: Secondary | ICD-10-CM | POA: Diagnosis not present

## 2015-06-05 DIAGNOSIS — M069 Rheumatoid arthritis, unspecified: Secondary | ICD-10-CM | POA: Diagnosis not present

## 2015-06-05 DIAGNOSIS — I129 Hypertensive chronic kidney disease with stage 1 through stage 4 chronic kidney disease, or unspecified chronic kidney disease: Secondary | ICD-10-CM | POA: Diagnosis not present

## 2015-06-05 DIAGNOSIS — Z87891 Personal history of nicotine dependence: Secondary | ICD-10-CM | POA: Diagnosis not present

## 2015-06-05 DIAGNOSIS — Z882 Allergy status to sulfonamides status: Secondary | ICD-10-CM | POA: Diagnosis not present

## 2015-06-05 DIAGNOSIS — Z89511 Acquired absence of right leg below knee: Secondary | ICD-10-CM | POA: Diagnosis not present

## 2015-06-05 DIAGNOSIS — M79662 Pain in left lower leg: Secondary | ICD-10-CM | POA: Diagnosis not present

## 2015-06-05 DIAGNOSIS — M179 Osteoarthritis of knee, unspecified: Secondary | ICD-10-CM | POA: Insufficient documentation

## 2015-06-05 DIAGNOSIS — I639 Cerebral infarction, unspecified: Secondary | ICD-10-CM | POA: Diagnosis not present

## 2015-06-05 DIAGNOSIS — E118 Type 2 diabetes mellitus with unspecified complications: Secondary | ICD-10-CM | POA: Diagnosis not present

## 2015-06-05 DIAGNOSIS — E1122 Type 2 diabetes mellitus with diabetic chronic kidney disease: Secondary | ICD-10-CM | POA: Diagnosis not present

## 2015-06-05 DIAGNOSIS — I10 Essential (primary) hypertension: Secondary | ICD-10-CM | POA: Diagnosis not present

## 2015-06-05 DIAGNOSIS — Z79899 Other long term (current) drug therapy: Secondary | ICD-10-CM | POA: Diagnosis not present

## 2015-06-05 DIAGNOSIS — L02416 Cutaneous abscess of left lower limb: Secondary | ICD-10-CM | POA: Diagnosis not present

## 2015-06-05 DIAGNOSIS — G8929 Other chronic pain: Secondary | ICD-10-CM | POA: Diagnosis not present

## 2015-06-05 DIAGNOSIS — Z5181 Encounter for therapeutic drug level monitoring: Secondary | ICD-10-CM

## 2015-06-05 DIAGNOSIS — M79605 Pain in left leg: Secondary | ICD-10-CM | POA: Diagnosis not present

## 2015-06-05 MED ORDER — OXYCODONE-ACETAMINOPHEN 10-325 MG PO TABS: 1.0000 | ORAL_TABLET | Freq: Four times a day (QID) | ORAL | Status: DC | PRN

## 2015-06-05 MED ORDER — FENTANYL 75 MCG/HR TD PT72: 75.0000 ug | MEDICATED_PATCH | TRANSDERMAL | Status: DC

## 2015-06-05 NOTE — Patient Instructions (Signed)
Please proceed to [facility] Emergency Room for care.

## 2015-06-05 NOTE — Progress Notes (Signed)
Pre visit review using our clinic review tool, if applicable. No additional management support is needed unless otherwise documented below in the visit note. 

## 2015-06-05 NOTE — Assessment & Plan Note (Addendum)
Worsening over 3 weeks Large necrotic bullous area on left lower lateral leg 1.5 in x 0.5-1 in wide Erythema, pain, swelling  Sending to ER Wants to go to [facility] ER Calling ahead, spoke with Leigh (?) to inform of arrival by POV

## 2015-06-05 NOTE — Progress Notes (Signed)
Patient ID: Crystal Fitzpatrick, female    DOB: [birth year]  Age: 70 y.o. MRN: [id number]  CC: Leg Injury   HPI Crystal Fitzpatrick presents for CC of left leg pain x 3 weeks.   1) Pt was seen on 05/15/15 for hitting her left leg on the car door. There was swelling, warmth, and erythema.  1 cm above the ankle and approx 4 cm long  Pain is 8/10 today and the site is worsening into a dark blister she reports Treatment to date: Elevating leg   History Crystal Fitzpatrick has a past medical history of DIABETES MELLITUS, TYPE II (07/17/2008); B12 DEFICIENCY (07/17/2008); Acute gouty arthropathy (12/11/2008); HYPONATREMIA (03/20/2010); HYPERKALEMIA (10/31/2009); ANEMIA-NOS (07/17/2008); ANXIETY (07/17/2008); Chronic pain syndrome (02/14/2010); Trigeminal neuralgia (02/14/2010); HEARING LOSS, RIGHT EAR (06/04/2009); HYPERTENSION (07/17/2008); CHF (01/28/2009); Pneumonia, organism unspecified (02/14/2010); GERD (07/17/2008); IBS (06/04/2009); UNSPECIFIED HEPATITIS (01/28/2009); RENAL INSUFFICIENCY (10/31/2009); MENOPAUSAL DISORDER (12/03/2009); Cellulitis and abscess of leg, except foot (11/15/2008); SKIN ULCER, CHRONIC (02/08/2009); Rheumatoid arthritis(714.0) (07/17/2008); SKIN LESION (06/04/2009); ARTHRITIS (01/28/2009); FOOT PAIN (07/17/2008); HYPERSOMNIA (02/14/2010); Altered mental status (02/07/2010); PERIPHERAL EDEMA (08/30/2008); Wheezing (12/03/2009); ABDOMINAL DISTENSION (10/11/2008); Dysuria (11/11/2009); Hypoxemia (03/20/2010); ANAPHYLACTIC SHOCK (05/15/2009); NEUROPATHY, HX OF (01/28/2009); COLONIC POLYPS, HX OF (07/17/2008); AMPUTATION, BELOW KNEE, RIGHT, HX OF (01/28/2009); Hyperlipemia (09/24/2010); Hyperlipidemia (09/24/2010); Seizure ([place]) (10/26/2010); Pneumonia, aspiration ([place]) (12/01/2011); Osteoporosis (11/16/2014); and Spinal stenosis of lumbar region (05/15/2015).   She has past surgical history that includes Amputation (02/02/2008); s/p EGD (2007); Tonsillectomy; s/p Breast biopsy (1992); s/p BKA (9/09); Tubal ligation; Nasal septum surgery; and foot surgury  (1980 and 1981).   Her family history includes Breast cancer in her sister; Diabetes in her other; Stroke in her other.She reports that she has quit smoking. She has never used smokeless tobacco. She reports that she does not drink alcohol or use illicit drugs.  Outpatient Prescriptions Prior to Visit  Medication Sig Dispense Refill  . albuterol (PROAIR HFA) 108 (90 BASE) MCG/ACT inhaler Inhale 2 puffs into the lungs every 6 (six) hours as needed.      [name] alendronate (FOSAMAX) 70 MG tablet Take 1 tablet (70 mg total) by mouth every 7 (seven) days. Take with a full glass of water on an empty stomach. 12 tablet 3  . ALPRAZolam (XANAX) 0.25 MG tablet TAKE ONE (1) TABLET THREE (3) TIMES EACHDAY AS NEEDED 90 tablet 2  . amoxicillin-clavulanate (AUGMENTIN) 875-125 MG tablet Take 1 tablet by mouth 2 (two) times daily. 20 tablet 0  . Ascorbic Acid (VITAMIN C) 500 MG tablet Take 500 mg by mouth daily.      [name] atorvastatin (LIPITOR) 10 MG tablet Take 1 tablet (10 mg total) by mouth daily. 90 tablet 3  . Blood Glucose Monitoring Suppl (ONE TOUCH ULTRA 2) W/DEVICE KIT 1 Device by Does not apply route once. 1 each 0  . clopidogrel (PLAVIX) 75 MG tablet Take 1 tablet (75 mg total) by mouth daily. 90 tablet 3  . cyclobenzaprine (FLEXERIL) 10 MG tablet TAKE 1 TABLET BY MOUTH EVERY 8 HOURS AS NEEDED 90 tablet 3  . diclofenac sodium (VOLTAREN) 1 % GEL Apply 2 g topically 4 (four) times daily. 3 Tube 3  . diphenoxylate-atropine (LOMOTIL) 2.5-0.025 MG tablet TAKE 1 TABLET BY MOUTH 2 TIMES A DAY AS NEEDED 60 tablet 1  . DULoxetine (CYMBALTA) 60 MG capsule TAKE 2 CAPSULES BY MOUTH DAILY 60 capsule 3  . EPINEPHrine 0.3 mg/0.3 mL IJ SOAJ injection Inject 0.3 mLs (0.3 mg total) into the muscle once. 1 Device 2  . furosemide (  LASIX) 40 MG tablet TAKE ONE TABLET EACH DAY as needed for swelling 90 tablet 3  . gabapentin (NEURONTIN) 600 MG tablet Take 1 tablet (600 mg total) by mouth 3 (three) times daily. 90 tablet 3  .  meclizine (ANTIVERT) 25 MG tablet Take 1 tablet (25 mg total) by mouth 3 (three) times daily as needed. 60 tablet 2  . Multiple Vitamins-Minerals (CENTRUM SILVER ULTRA WOMENS PO) Take by mouth daily.      [name] olmesartan (BENICAR) 20 MG tablet Take 1 tablet (20 mg total) by mouth daily. 90 tablet 3  . omega-3 acid ethyl esters (LOVAZA) 1 G capsule Take 1 capsule (1 g total) by mouth daily. 90 capsule 3  . ONETOUCH DELICA LANCETS MISC Use as directed to test blood sugar dx 250.00 100 each 2  . promethazine (PHENERGAN) 25 MG tablet Take 1 tablet (25 mg total) by mouth every 6 (six) hours as needed. for nausea 60 tablet 1  . promethazine (PHENERGAN) 25 MG tablet TAKE 1 TABLET BY MOUTH EVERY 6 HOURS AS NEEDED FOR NAUSEA 60 tablet 1  . Psyllium (METAMUCIL) 30.9 % POWD Take by mouth. Take as directed     . solifenacin (VESICARE) 5 MG tablet Take 1 tablet (5 mg total) by mouth daily. 90 tablet 3  . topiramate (TOPAMAX) 100 MG tablet TAKE 3 TABLETS BY MOUTH EVERY NIGHT AT BEDTIME 90 tablet 3  . triamcinolone (NASACORT AQ) 55 MCG/ACT AERO nasal inhaler Place 2 sprays into the nose daily. 1 Inhaler 12  . fentaNYL (DURAGESIC - DOSED MCG/HR) 75 MCG/HR Place 1 patch (75 mcg total) onto the skin every 3 (three) days. 1 patch every 3 days 10 patch 0  . oxyCODONE-acetaminophen (PERCOCET) 10-325 MG tablet Take 1 tablet by mouth every 6 (six) hours as needed for pain. 120 tablet 0   No facility-administered medications prior to visit.    ROS Review of Systems  Constitutional: Negative for fever, chills, diaphoresis and fatigue.  Respiratory: Negative for chest tightness, shortness of breath and wheezing.   Cardiovascular: Positive for leg swelling. Negative for chest pain and palpitations.  Gastrointestinal: Negative for nausea and vomiting.  Skin: Positive for color change and wound. Negative for rash.  Neurological: Negative for dizziness, weakness, numbness and headaches.  Psychiatric/Behavioral: The patient  is not nervous/anxious.     Objective:  BP 138/78 mmHg  Pulse 106  Temp(Src) 99.3 F (37.4 C) (Oral)  Wt   SpO2 94%  Physical Exam  Constitutional: She is oriented to person, place, and time. She appears well-developed and well-nourished. No distress.  HENT:  Head: Normocephalic and atraumatic.  Right Ear: External ear normal.  Left Ear: External ear normal.  Musculoskeletal: She exhibits edema and tenderness.  No right leg- amputee   Neurological: She is alert and oriented to person, place, and time. No cranial nerve deficit. She exhibits normal muscle tone. Coordination normal.  Skin: Skin is warm and dry. No rash noted. She is not diaphoretic. There is erythema.     Redness lower left leg, swelling, 1.5 inches long x 1  inches wide large bullous and black, very tender to palpation  Psychiatric: She has a normal mood and affect. Her behavior is normal. Judgment and thought content normal.   Assessment & Plan:   Crystal Fitzpatrick was seen today for leg injury.  Diagnoses and all orders for this visit:  Left leg cellulitis   I am having Crystal Fitzpatrick maintain her Multiple Vitamins-Minerals (CENTRUM SILVER ULTRA WOMENS PO), Psyllium,  albuterol, vitamin C, ONETOUCH DELICA LANCETS, ONE TOUCH ULTRA 2, diclofenac sodium, omega-3 acid ethyl esters, promethazine, EPINEPHrine, triamcinolone, alendronate, ALPRAZolam, gabapentin, DULoxetine, atorvastatin, clopidogrel, solifenacin, topiramate, furosemide, meclizine, diphenoxylate-atropine, cyclobenzaprine, amoxicillin-clavulanate, olmesartan, and promethazine.  No orders of the defined types were placed in this encounter.     Follow-up: Return if symptoms worsen or fail to improve.

## 2015-06-05 NOTE — Progress Notes (Signed)
Subjective:    Patient ID: Crystal Fitzpatrick, female    DOB: [birth year], 70 y.o.   MRN: [id number]  HPI: [name] is a 70 year old female who returns for follow up for chronic pain and medication refill. She says her pain is located in her lower back  and left lower extremity. She rates her pain 7.Her current exercise regime is yoga seven days a week and performing stretching exercises with bands and chair exercises daily.  Also states three weeks ago she was getting out of the car when she slipped and hit her left leg against the car. She was on her way to see Dr. Jonny Ruiz for f/u for cellulitis. Dr. Jonny Ruiz ordered X-ray and prescribed Augmentin . She had a follow up appointment today with Ms. Doss NP, on her left lower extremity she has erythema and a large necrotic bullous, she was instructed to go to ED. She wants to go to [facility] ED. She came for her pain management appointment first then she will proceed to [facility] ED she states. Very tearful emotional support given.   Pain Inventory Average Pain 5 Pain Right Now 7 My pain is sharp, burning, stabbing and aching  In the last 24 hours, has pain interfered with the following? General activity 10 Relation with others 7 Enjoyment of life 8 What TIME of day is your pain at its worst? morning, evening, night Sleep (in general) Poor  Pain is worse with: all activity Pain improves with: medication Relief from Meds: 8  Mobility use a wheelchair Do you have any goals in this area?  yes  Function disabled: date disabled . Do you have any goals in this area?  yes  Neuro/Psych weakness spasms  Prior Studies Any changes since last visit?  yes x-rays  Physicians involved in your care Any changes since last visit?  no   Family History  Problem Relation Age of Onset  . Breast cancer Sister   . Diabetes Other   . Stroke Other     Grandparents   Social History   Social History  . Marital Status: Divorced    Spouse  Name: N/A  . Number of Children: 1  . Years of Education: N/A   Occupational History  . retired Marketing executive    Social History Main Topics  . Smoking status: Former Games developer  . Smokeless tobacco: Never Used  . Alcohol Use: No  . Drug Use: No  . Sexual Activity: Not Asked   Other Topics Concern  . None   Social History Narrative   Past Surgical History  Procedure Laterality Date  . Amputation  02/02/2008    below right knee  . S/p egd  2007    with Botox for? Achalasia  . Tonsillectomy    . S/p breast biopsy  1992  . 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 Tubal ligation    . Nasal septum surgery    . Foot surgury  1980 and 1981   Past Medical History  Diagnosis Date  . DIABETES MELLITUS, TYPE II 07/17/2008  . B12 DEFICIENCY 07/17/2008  . Acute gouty arthropathy 12/11/2008  . HYPONATREMIA 03/20/2010  . HYPERKALEMIA 10/31/2009  . ANEMIA-NOS 07/17/2008  . ANXIETY 07/17/2008  . Chronic pain syndrome 02/14/2010  . Trigeminal neuralgia 02/14/2010  . HEARING LOSS, RIGHT EAR 06/04/2009  . HYPERTENSION 07/17/2008  . CHF 01/28/2009  . Pneumonia, organism unspecified 02/14/2010  . GERD 07/17/2008  .  IBS 06/04/2009  . UNSPECIFIED HEPATITIS 01/28/2009  . RENAL INSUFFICIENCY 10/31/2009  . MENOPAUSAL DISORDER 12/03/2009  . Cellulitis and abscess of leg, except foot 11/15/2008  . SKIN ULCER, CHRONIC 02/08/2009  . Rheumatoid arthritis(714.0) 07/17/2008  . SKIN LESION 06/04/2009  . ARTHRITIS 01/28/2009  . FOOT PAIN 07/17/2008  . HYPERSOMNIA 02/14/2010  . Altered mental status 02/07/2010  . PERIPHERAL EDEMA 08/30/2008  . Wheezing 12/03/2009  . ABDOMINAL DISTENSION 10/11/2008  . Dysuria 11/11/2009  . Hypoxemia 03/20/2010  . ANAPHYLACTIC SHOCK 05/15/2009  . NEUROPATHY, HX OF 01/28/2009  . COLONIC POLYPS, HX OF 07/17/2008  . AMPUTATION, BELOW KNEE, RIGHT, HX OF 01/28/2009  . Hyperlipemia 09/24/2010  . Hyperlipidemia 09/24/2010  . Seizure ([facility]) 10/26/2010  . Pneumonia, aspiration ([facility])  12/01/2011    Episode July 2013, tx per [facility]  . Osteoporosis 11/16/2014  . Spinal stenosis of lumbar region 05/15/2015   BP 146/80 mmHg  Pulse 103  Resp 14  SpO2 92%  Opioid Risk Score:   Fall Risk Score:  `1  Depression screen PHQ 2/9  Depression screen [facility] 2/9 02/25/2015 02/12/2015 01/15/2015 12/19/2014 11/20/2014 10/26/2014 09/27/2014  Decreased Interest 1 0 0 0 0 0 0  Down, Depressed, Hopeless 1 0 0 0 0 0 0  PHQ - 2 Score 2 0 0 0 0 0 0  Altered sleeping 3 - - - - - 1  Tired, decreased energy 3 - - - - - 0  Change in appetite - - - - - - 1  Feeling bad or failure about yourself  - - - - - - 1  Trouble concentrating 0 - - - - - 0  Moving slowly or fidgety/restless 0 - - - - - 0  Suicidal thoughts 0 - - - - - 0  PHQ-9 Score 8 - - - - - 3  Difficult doing work/chores Not difficult at all - - - - - -     Review of Systems  All other systems reviewed and are negative.      Objective:   Physical Exam  Constitutional: She is oriented to person, place, and time. She appears well-developed and well-nourished.  HENT:  Head: Normocephalic and atraumatic.  Neck: Normal range of motion. Neck supple.  Cardiovascular: Normal rate and regular rhythm.   Pulmonary/Chest: Effort normal and breath sounds normal.  Musculoskeletal:  Normal Muscle Bulk and Muscle Testing Reveals: Upper Extremities: Full ROM and Muscle Strength 5/5 Lumbar Paraspinal Tenderness: L-3- L-5 Lower Extremities: Right BKA Left: Full ROM and Muscle Strength 5/5 Left Lower Extremity with Cellulitis and necrotic bullous Arrived in wheelchair    Neurological: She is alert and oriented to person, place, and time.  Skin: Skin is warm and dry.  Psychiatric: She has a normal mood and affect.  Nursing note and vitals reviewed.         Assessment & Plan:  1.Right below-knee amputation, history of phantom limb pain.: No complaints at this time regarding phantom pain.Continue Current Medication  Regime. Continue to Monitor.  2. Peripheral neuropathy: Continue Gabapentin.  Refilled: Fenatnyl Patch 75 mcg one patch every three days #10 and Oxycodone 10/325mg  one tablet every 6 hours as needed #120  3. History of rheumatoid arthritis. Continue Current Medication and Exercise and heat Therapy.  4. Osteoarthritis of left knee: Continue with Voltaren gel/ Exercise and heat therapy.  5. Chronic cellulitis, left leg with associated edema:PCP Following 6. Lumbar Spondylosis: Continue current medication regime. Continue HEP.  20 minutes  of face to face patient care time was spent during this visit. All questions were encouraged and answered.

## 2015-06-11 DIAGNOSIS — E11622 Type 2 diabetes mellitus with other skin ulcer: Secondary | ICD-10-CM | POA: Diagnosis not present

## 2015-06-11 DIAGNOSIS — Z4800 Encounter for change or removal of nonsurgical wound dressing: Secondary | ICD-10-CM | POA: Diagnosis not present

## 2015-06-11 DIAGNOSIS — L98499 Non-pressure chronic ulcer of skin of other sites with unspecified severity: Secondary | ICD-10-CM | POA: Diagnosis not present

## 2015-06-11 DIAGNOSIS — L97222 Non-pressure chronic ulcer of left calf with fat layer exposed: Secondary | ICD-10-CM | POA: Diagnosis not present

## 2015-06-11 DIAGNOSIS — L03116 Cellulitis of left lower limb: Secondary | ICD-10-CM | POA: Diagnosis not present

## 2015-06-11 DIAGNOSIS — S8012XA Contusion of left lower leg, initial encounter: Secondary | ICD-10-CM | POA: Diagnosis not present

## 2015-06-13 DIAGNOSIS — Z4800 Encounter for change or removal of nonsurgical wound dressing: Secondary | ICD-10-CM | POA: Diagnosis not present

## 2015-06-13 DIAGNOSIS — L03116 Cellulitis of left lower limb: Secondary | ICD-10-CM | POA: Diagnosis not present

## 2015-06-17 DIAGNOSIS — E11622 Type 2 diabetes mellitus with other skin ulcer: Secondary | ICD-10-CM | POA: Diagnosis not present

## 2015-06-17 DIAGNOSIS — L98499 Non-pressure chronic ulcer of skin of other sites with unspecified severity: Secondary | ICD-10-CM | POA: Diagnosis not present

## 2015-06-17 DIAGNOSIS — L97222 Non-pressure chronic ulcer of left calf with fat layer exposed: Secondary | ICD-10-CM | POA: Diagnosis not present

## 2015-06-17 DIAGNOSIS — S8012XD Contusion of left lower leg, subsequent encounter: Secondary | ICD-10-CM | POA: Diagnosis not present

## 2015-06-20 ENCOUNTER — Telehealth: Payer: Self-pay

## 2015-06-20 DIAGNOSIS — Z4800 Encounter for change or removal of nonsurgical wound dressing: Secondary | ICD-10-CM | POA: Diagnosis not present

## 2015-06-20 DIAGNOSIS — L98499 Non-pressure chronic ulcer of skin of other sites with unspecified severity: Secondary | ICD-10-CM | POA: Diagnosis not present

## 2015-06-20 DIAGNOSIS — L97222 Non-pressure chronic ulcer of left calf with fat layer exposed: Secondary | ICD-10-CM | POA: Diagnosis not present

## 2015-06-20 NOTE — Telephone Encounter (Signed)
Home Health Cert/Plan of Care received completed from MD. Faxed, copy sent to scan 

## 2015-06-24 DIAGNOSIS — S8012XD Contusion of left lower leg, subsequent encounter: Secondary | ICD-10-CM | POA: Diagnosis not present

## 2015-06-24 DIAGNOSIS — L98499 Non-pressure chronic ulcer of skin of other sites with unspecified severity: Secondary | ICD-10-CM | POA: Diagnosis not present

## 2015-06-24 DIAGNOSIS — L97222 Non-pressure chronic ulcer of left calf with fat layer exposed: Secondary | ICD-10-CM | POA: Diagnosis not present

## 2015-06-24 DIAGNOSIS — E11622 Type 2 diabetes mellitus with other skin ulcer: Secondary | ICD-10-CM | POA: Diagnosis not present

## 2015-06-27 DIAGNOSIS — Z4800 Encounter for change or removal of nonsurgical wound dressing: Secondary | ICD-10-CM | POA: Diagnosis not present

## 2015-06-27 DIAGNOSIS — L97222 Non-pressure chronic ulcer of left calf with fat layer exposed: Secondary | ICD-10-CM | POA: Diagnosis not present

## 2015-07-01 DIAGNOSIS — L97222 Non-pressure chronic ulcer of left calf with fat layer exposed: Secondary | ICD-10-CM | POA: Diagnosis not present

## 2015-07-01 DIAGNOSIS — E11622 Type 2 diabetes mellitus with other skin ulcer: Secondary | ICD-10-CM | POA: Diagnosis not present

## 2015-07-01 DIAGNOSIS — S8012XD Contusion of left lower leg, subsequent encounter: Secondary | ICD-10-CM | POA: Diagnosis not present

## 2015-07-01 DIAGNOSIS — L98499 Non-pressure chronic ulcer of skin of other sites with unspecified severity: Secondary | ICD-10-CM | POA: Diagnosis not present

## 2015-07-03 DIAGNOSIS — L97222 Non-pressure chronic ulcer of left calf with fat layer exposed: Secondary | ICD-10-CM | POA: Diagnosis not present

## 2015-07-03 DIAGNOSIS — Z4800 Encounter for change or removal of nonsurgical wound dressing: Secondary | ICD-10-CM | POA: Diagnosis not present

## 2015-07-04 ENCOUNTER — Encounter: Payer: Medicare Other | Admitting: Registered Nurse

## 2015-07-05 DIAGNOSIS — Z4800 Encounter for change or removal of nonsurgical wound dressing: Secondary | ICD-10-CM | POA: Diagnosis not present

## 2015-07-05 DIAGNOSIS — L97222 Non-pressure chronic ulcer of left calf with fat layer exposed: Secondary | ICD-10-CM | POA: Diagnosis not present

## 2015-07-06 ENCOUNTER — Other Ambulatory Visit: Payer: Self-pay | Admitting: Physical Medicine & Rehabilitation

## 2015-07-08 DIAGNOSIS — L98499 Non-pressure chronic ulcer of skin of other sites with unspecified severity: Secondary | ICD-10-CM | POA: Diagnosis not present

## 2015-07-08 DIAGNOSIS — L97222 Non-pressure chronic ulcer of left calf with fat layer exposed: Secondary | ICD-10-CM | POA: Diagnosis not present

## 2015-07-08 DIAGNOSIS — S8012XD Contusion of left lower leg, subsequent encounter: Secondary | ICD-10-CM | POA: Diagnosis not present

## 2015-07-08 DIAGNOSIS — E11622 Type 2 diabetes mellitus with other skin ulcer: Secondary | ICD-10-CM | POA: Diagnosis not present

## 2015-07-09 ENCOUNTER — Encounter: Payer: Medicare Other | Attending: Physical Medicine and Rehabilitation | Admitting: Registered Nurse

## 2015-07-09 ENCOUNTER — Encounter: Payer: Self-pay | Admitting: Registered Nurse

## 2015-07-09 VITALS — BP 113/60 | HR 60

## 2015-07-09 DIAGNOSIS — Z5181 Encounter for therapeutic drug level monitoring: Secondary | ICD-10-CM

## 2015-07-09 DIAGNOSIS — M4716 Other spondylosis with myelopathy, lumbar region: Secondary | ICD-10-CM | POA: Diagnosis not present

## 2015-07-09 DIAGNOSIS — G546 Phantom limb syndrome with pain: Secondary | ICD-10-CM | POA: Diagnosis not present

## 2015-07-09 DIAGNOSIS — G609 Hereditary and idiopathic neuropathy, unspecified: Secondary | ICD-10-CM | POA: Insufficient documentation

## 2015-07-09 DIAGNOSIS — M069 Rheumatoid arthritis, unspecified: Secondary | ICD-10-CM | POA: Diagnosis not present

## 2015-07-09 DIAGNOSIS — M179 Osteoarthritis of knee, unspecified: Secondary | ICD-10-CM | POA: Diagnosis not present

## 2015-07-09 DIAGNOSIS — Z89519 Acquired absence of unspecified leg below knee: Secondary | ICD-10-CM | POA: Diagnosis not present

## 2015-07-09 DIAGNOSIS — G894 Chronic pain syndrome: Secondary | ICD-10-CM

## 2015-07-09 DIAGNOSIS — Z79899 Other long term (current) drug therapy: Secondary | ICD-10-CM

## 2015-07-09 MED ORDER — OXYCODONE-ACETAMINOPHEN 10-325 MG PO TABS: 1.0000 | ORAL_TABLET | Freq: Four times a day (QID) | ORAL | Status: DC | PRN

## 2015-07-09 MED ORDER — FENTANYL 75 MCG/HR TD PT72: 75.0000 ug | MEDICATED_PATCH | TRANSDERMAL | Status: DC

## 2015-07-09 NOTE — Progress Notes (Signed)
Subjective:    Patient ID: Crystal Fitzpatrick, female    DOB: 1946-05-11, 70 y.o.   MRN: [id number]  HPI: [name] is a 70 year old female who returns for follow up for chronic pain and medication refill. She says her pain is located in her mid- lower back and phantom pain in her right stump. She rates her pain 6.Her current exercise regime is yoga seven days a week and performing stretching exercises with bands and chair exercises daily.  She was admitted to [facility] on 06/05/2015 and discharged 06/08/2015. She was admitted for Cellulitis of left leg. She received IV antibiotics Zosyn and Vancomycin was discharged on Augmentin for 10 days. She has wound care three times a week at  [facility]. Left lower extremity dressing intact with soft foot bootie. Also states last month she fell asleep in her wheelchair and she slid out of her wheelchair. She landed on her buttocks. She called EMS and they assisted her up. She states she didn't go to the ED for evaluation.  Pain Inventory Average Pain 6 Pain Right Now 6 My pain is sharp, burning, stabbing and aching  In the last 24 hours, has pain interfered with the following? General activity 7 Relation with others 8 Enjoyment of life 7 What TIME of day is your pain at its worst? morning night Sleep (in general) NA  Pain is worse with: inactivity and some activites Pain improves with: rest, therapy/exercise, pacing activities, medication and injections Relief from Meds: na  Mobility Do you have any goals in this area?  yes  Function Do you have any goals in this area?  no  Neuro/Psych No problems in this area  Prior Studies Any changes since last visit?  no  Physicians involved in your care Any changes since last visit?  no   Family History  Problem Relation Age of Onset  . Breast cancer Sister   . Diabetes Other   . Stroke Other     Grandparents   Social History   Social History  . Marital  Status: Divorced    Spouse Name: N/A  . Number of Children: 1  . Years of Education: N/A   Occupational History  . retired Marketing executive    Social History Main Topics  . Smoking status: Former Games developer  . Smokeless tobacco: Never Used  . Alcohol Use: No  . Drug Use: No  . Sexual Activity: Not Asked   Other Topics Concern  . None   Social History Narrative   Past Surgical History  Procedure Laterality Date  . Amputation  02/02/2008    below right knee  . S/p egd  2007    with Botox for? Achalasia  . Tonsillectomy    . S/p breast biopsy  1992  . 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 Tubal ligation    . Nasal septum surgery    . Foot surgury  1980 and 1981   Past Medical History  Diagnosis Date  . DIABETES MELLITUS, TYPE II 07/17/2008  . B12 DEFICIENCY 07/17/2008  . Acute gouty arthropathy 12/11/2008  . HYPONATREMIA 03/20/2010  . HYPERKALEMIA 10/31/2009  . ANEMIA-NOS 07/17/2008  . ANXIETY 07/17/2008  . Chronic pain syndrome 02/14/2010  . Trigeminal neuralgia 02/14/2010  . HEARING LOSS, RIGHT EAR 06/04/2009  . HYPERTENSION 07/17/2008  . CHF 01/28/2009  . Pneumonia, organism unspecified 02/14/2010  . GERD 07/17/2008  . IBS 06/04/2009  .  UNSPECIFIED HEPATITIS 01/28/2009  . RENAL INSUFFICIENCY 10/31/2009  . MENOPAUSAL DISORDER 12/03/2009  . Cellulitis and abscess of leg, except foot 11/15/2008  . SKIN ULCER, CHRONIC 02/08/2009  . Rheumatoid arthritis(714.0) 07/17/2008  . SKIN LESION 06/04/2009  . ARTHRITIS 01/28/2009  . FOOT PAIN 07/17/2008  . HYPERSOMNIA 02/14/2010  . Altered mental status 02/07/2010  . PERIPHERAL EDEMA 08/30/2008  . Wheezing 12/03/2009  . ABDOMINAL DISTENSION 10/11/2008  . Dysuria 11/11/2009  . Hypoxemia 03/20/2010  . ANAPHYLACTIC SHOCK 05/15/2009  . NEUROPATHY, HX OF 01/28/2009  . COLONIC POLYPS, HX OF 07/17/2008  . AMPUTATION, BELOW KNEE, RIGHT, HX OF 01/28/2009  . Hyperlipemia 09/24/2010  . Hyperlipidemia 09/24/2010  . Seizure (HCC) 10/26/2010  .  Pneumonia, aspiration (HCC) 12/01/2011    Episode July 2013, tx per [facility]  . Osteoporosis 11/16/2014  . Spinal stenosis of lumbar region 05/15/2015   BP 97/48 mmHg  Pulse 60  SpO2 98%  Opioid Risk Score:   Fall Risk Score:  `1  Depression screen PHQ 2/9  Depression screen [facility] 2/9 02/25/2015 02/12/2015 01/15/2015 12/19/2014 11/20/2014 10/26/2014 09/27/2014  Decreased Interest 1 0 0 0 0 0 0  Down, Depressed, Hopeless 1 0 0 0 0 0 0  PHQ - 2 Score 2 0 0 0 0 0 0  Altered sleeping 3 - - - - - 1  Tired, decreased energy 3 - - - - - 0  Change in appetite - - - - - - 1  Feeling bad or failure about yourself  - - - - - - 1  Trouble concentrating 0 - - - - - 0  Moving slowly or fidgety/restless 0 - - - - - 0  Suicidal thoughts 0 - - - - - 0  PHQ-9 Score 8 - - - - - 3  Difficult doing work/chores Not difficult at all - - - - - -     Review of Systems     Objective:   Physical Exam  Constitutional: She is oriented to person, place, and time. She appears well-developed and well-nourished.  HENT:  Head: Normocephalic and atraumatic.  Neck: Normal range of motion. Neck supple.  Cardiovascular: Normal rate and regular rhythm.   Pulmonary/Chest: Effort normal and breath sounds normal.  Musculoskeletal:  Normal Muscle Bulk and Muscle Testing Reveals: Upper Extremities: Full ROM and Muscle Strength 5/5 Thoracic Paraspinal Tenderness Mainly Right Side  T-7- T-9 Lumbar Paraspinal Tenderness: L-3- L-5 Lower Extremities: Right BKA Left: Decreased ROM and Muscle Strength 5/5 Left Lower Extremity Dressing Intact Arrived in wheelchair   Neurological: She is alert and oriented to person, place, and time.  Skin: Skin is warm and dry.  Psychiatric: She has a normal mood and affect.  Nursing note and vitals reviewed.         Assessment & Plan:  1.Right below-knee amputation, history of phantom limb pain.: .Continue Current Medication Regime and Continue to Monitor.  2.  Peripheral neuropathy: Continue Gabapentin.  Refilled: Fenatnyl Patch 75 mcg one patch every three days #10 and Oxycodone 10/325mg  one tablet every 6 hours as needed #120  3. History of rheumatoid arthritis. Continue Current Medication and Exercise and heat Therapy.  4. Osteoarthritis of left knee: Continue with Voltaren gel/ Exercise and heat therapy.  5. Chronic cellulitis, left leg with associated edema:PCP Following/ Attending Wound Care three times a week. 6. Lumbar Spondylosis: Continue current medication regime. Continue HEP.  20 minutes of face to face patient care time was spent during  this visit. All questions were encouraged and answered.

## 2015-07-10 DIAGNOSIS — Z4800 Encounter for change or removal of nonsurgical wound dressing: Secondary | ICD-10-CM | POA: Diagnosis not present

## 2015-07-10 DIAGNOSIS — L97222 Non-pressure chronic ulcer of left calf with fat layer exposed: Secondary | ICD-10-CM | POA: Diagnosis not present

## 2015-07-12 DIAGNOSIS — L97222 Non-pressure chronic ulcer of left calf with fat layer exposed: Secondary | ICD-10-CM | POA: Diagnosis not present

## 2015-07-12 DIAGNOSIS — Z4800 Encounter for change or removal of nonsurgical wound dressing: Secondary | ICD-10-CM | POA: Diagnosis not present

## 2015-07-15 DIAGNOSIS — L98499 Non-pressure chronic ulcer of skin of other sites with unspecified severity: Secondary | ICD-10-CM | POA: Diagnosis not present

## 2015-07-15 DIAGNOSIS — E11622 Type 2 diabetes mellitus with other skin ulcer: Secondary | ICD-10-CM | POA: Diagnosis not present

## 2015-07-15 DIAGNOSIS — S8012XD Contusion of left lower leg, subsequent encounter: Secondary | ICD-10-CM | POA: Diagnosis not present

## 2015-07-15 DIAGNOSIS — L97222 Non-pressure chronic ulcer of left calf with fat layer exposed: Secondary | ICD-10-CM | POA: Diagnosis not present

## 2015-07-17 DIAGNOSIS — L97222 Non-pressure chronic ulcer of left calf with fat layer exposed: Secondary | ICD-10-CM | POA: Diagnosis not present

## 2015-07-17 DIAGNOSIS — Z4800 Encounter for change or removal of nonsurgical wound dressing: Secondary | ICD-10-CM | POA: Diagnosis not present

## 2015-07-19 DIAGNOSIS — Z4800 Encounter for change or removal of nonsurgical wound dressing: Secondary | ICD-10-CM | POA: Diagnosis not present

## 2015-07-19 DIAGNOSIS — L97222 Non-pressure chronic ulcer of left calf with fat layer exposed: Secondary | ICD-10-CM | POA: Diagnosis not present

## 2015-07-22 DIAGNOSIS — N179 Acute kidney failure, unspecified: Secondary | ICD-10-CM | POA: Diagnosis not present

## 2015-07-22 DIAGNOSIS — L97222 Non-pressure chronic ulcer of left calf with fat layer exposed: Secondary | ICD-10-CM | POA: Diagnosis not present

## 2015-07-22 DIAGNOSIS — S8012XD Contusion of left lower leg, subsequent encounter: Secondary | ICD-10-CM | POA: Diagnosis not present

## 2015-07-22 DIAGNOSIS — E11622 Type 2 diabetes mellitus with other skin ulcer: Secondary | ICD-10-CM | POA: Diagnosis not present

## 2015-07-22 DIAGNOSIS — L98499 Non-pressure chronic ulcer of skin of other sites with unspecified severity: Secondary | ICD-10-CM | POA: Diagnosis not present

## 2015-07-22 DIAGNOSIS — Z87891 Personal history of nicotine dependence: Secondary | ICD-10-CM | POA: Diagnosis not present

## 2015-07-22 DIAGNOSIS — Z8673 Personal history of transient ischemic attack (TIA), and cerebral infarction without residual deficits: Secondary | ICD-10-CM | POA: Diagnosis not present

## 2015-07-22 DIAGNOSIS — G8929 Other chronic pain: Secondary | ICD-10-CM | POA: Diagnosis not present

## 2015-07-22 DIAGNOSIS — I739 Peripheral vascular disease, unspecified: Secondary | ICD-10-CM | POA: Diagnosis not present

## 2015-07-25 DIAGNOSIS — L97222 Non-pressure chronic ulcer of left calf with fat layer exposed: Secondary | ICD-10-CM | POA: Diagnosis not present

## 2015-07-25 DIAGNOSIS — Z4800 Encounter for change or removal of nonsurgical wound dressing: Secondary | ICD-10-CM | POA: Diagnosis not present

## 2015-07-26 DIAGNOSIS — L97222 Non-pressure chronic ulcer of left calf with fat layer exposed: Secondary | ICD-10-CM | POA: Diagnosis not present

## 2015-07-26 DIAGNOSIS — E118 Type 2 diabetes mellitus with unspecified complications: Secondary | ICD-10-CM | POA: Diagnosis not present

## 2015-07-26 DIAGNOSIS — N179 Acute kidney failure, unspecified: Secondary | ICD-10-CM | POA: Diagnosis not present

## 2015-07-26 DIAGNOSIS — Z8673 Personal history of transient ischemic attack (TIA), and cerebral infarction without residual deficits: Secondary | ICD-10-CM | POA: Diagnosis not present

## 2015-07-26 DIAGNOSIS — Z87891 Personal history of nicotine dependence: Secondary | ICD-10-CM | POA: Diagnosis not present

## 2015-07-26 DIAGNOSIS — G8929 Other chronic pain: Secondary | ICD-10-CM | POA: Diagnosis not present

## 2015-07-26 DIAGNOSIS — I739 Peripheral vascular disease, unspecified: Secondary | ICD-10-CM | POA: Diagnosis not present

## 2015-07-30 DIAGNOSIS — E11622 Type 2 diabetes mellitus with other skin ulcer: Secondary | ICD-10-CM | POA: Diagnosis not present

## 2015-07-30 DIAGNOSIS — L98499 Non-pressure chronic ulcer of skin of other sites with unspecified severity: Secondary | ICD-10-CM | POA: Diagnosis not present

## 2015-07-30 DIAGNOSIS — L97222 Non-pressure chronic ulcer of left calf with fat layer exposed: Secondary | ICD-10-CM | POA: Diagnosis not present

## 2015-07-30 DIAGNOSIS — S8012XD Contusion of left lower leg, subsequent encounter: Secondary | ICD-10-CM | POA: Diagnosis not present

## 2015-08-01 DIAGNOSIS — Z4800 Encounter for change or removal of nonsurgical wound dressing: Secondary | ICD-10-CM | POA: Diagnosis not present

## 2015-08-01 DIAGNOSIS — L97222 Non-pressure chronic ulcer of left calf with fat layer exposed: Secondary | ICD-10-CM | POA: Diagnosis not present

## 2015-08-05 DIAGNOSIS — E11622 Type 2 diabetes mellitus with other skin ulcer: Secondary | ICD-10-CM | POA: Diagnosis not present

## 2015-08-05 DIAGNOSIS — S8012XD Contusion of left lower leg, subsequent encounter: Secondary | ICD-10-CM | POA: Diagnosis not present

## 2015-08-05 DIAGNOSIS — L97222 Non-pressure chronic ulcer of left calf with fat layer exposed: Secondary | ICD-10-CM | POA: Diagnosis not present

## 2015-08-05 DIAGNOSIS — L98499 Non-pressure chronic ulcer of skin of other sites with unspecified severity: Secondary | ICD-10-CM | POA: Diagnosis not present

## 2015-08-06 ENCOUNTER — Encounter: Payer: Self-pay | Admitting: Registered Nurse

## 2015-08-06 ENCOUNTER — Encounter: Payer: Medicare Other | Attending: Physical Medicine and Rehabilitation | Admitting: Registered Nurse

## 2015-08-06 VITALS — BP 131/69 | HR 83

## 2015-08-06 DIAGNOSIS — Z89519 Acquired absence of unspecified leg below knee: Secondary | ICD-10-CM | POA: Insufficient documentation

## 2015-08-06 DIAGNOSIS — M069 Rheumatoid arthritis, unspecified: Secondary | ICD-10-CM | POA: Diagnosis not present

## 2015-08-06 DIAGNOSIS — G609 Hereditary and idiopathic neuropathy, unspecified: Secondary | ICD-10-CM | POA: Diagnosis not present

## 2015-08-06 DIAGNOSIS — M4716 Other spondylosis with myelopathy, lumbar region: Secondary | ICD-10-CM | POA: Insufficient documentation

## 2015-08-06 DIAGNOSIS — M179 Osteoarthritis of knee, unspecified: Secondary | ICD-10-CM | POA: Insufficient documentation

## 2015-08-06 DIAGNOSIS — G894 Chronic pain syndrome: Secondary | ICD-10-CM

## 2015-08-06 DIAGNOSIS — Z5181 Encounter for therapeutic drug level monitoring: Secondary | ICD-10-CM

## 2015-08-06 DIAGNOSIS — G546 Phantom limb syndrome with pain: Secondary | ICD-10-CM | POA: Diagnosis not present

## 2015-08-06 MED ORDER — OXYCODONE-ACETAMINOPHEN 10-325 MG PO TABS: 1.0000 | ORAL_TABLET | Freq: Four times a day (QID) | ORAL | Status: DC | PRN

## 2015-08-06 MED ORDER — FENTANYL 75 MCG/HR TD PT72: 75.0000 ug | MEDICATED_PATCH | TRANSDERMAL | Status: DC

## 2015-08-06 NOTE — Progress Notes (Signed)
Subjective:    Patient ID: Crystal Fitzpatrick, female    DOB: 1946-04-03, 70 y.o.   MRN: [id number]  HPI: Crystal Fitzpatrick is a 70 year old female who returns for follow up for chronic pain and medication refill. She states her pain is located in her right shoulder, lower back and left lower extremity. She rates her pain 7.Her current exercise regime is yoga twice a week and performing stretching exercises with bands and chair exercises daily.  Also states she's receiving wound care at [facility] twice a week. Left lower extremity dressing intact.   Pain Inventory Average Pain 8 Pain Right Now 7 My pain is intermittent, constant, sharp, burning, stabbing and aching  In the last 24 hours, has pain interfered with the following? General activity 7 Relation with others 8 Enjoyment of life 4 What TIME of day is your pain at its worst? . Sleep (in general) Poor  Pain is worse with: sitting and inactivity Pain improves with: heat/ice and medication Relief from Meds: .  Mobility use a wheelchair Do you have any goals in this area?  yes  Function Do you have any goals in this area?  no  Neuro/Psych No problems in this area  Prior Studies Any changes since last visit?  no  Physicians involved in your care Any changes since last visit?  no   Family History  Problem Relation Age of Onset  . Breast cancer Sister   . Diabetes Other   . Stroke Other     Grandparents   Social History   Social History  . Marital Status: Divorced    Spouse Name: N/A  . Number of Children: 1  . Years of Education: N/A   Occupational History  . retired Marketing executive    Social History Main Topics  . Smoking status: Former Games developer  . Smokeless tobacco: Never Used  . Alcohol Use: No  . Drug Use: No  . Sexual Activity: Not on file   Other Topics Concern  . Not on file   Social History Narrative   Past Surgical History  Procedure Laterality Date  .  Amputation  02/02/2008    below right knee  . S/p egd  2007    with Botox for? Achalasia  . Tonsillectomy    . S/p breast biopsy  1992  . 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 Tubal ligation    . Nasal septum surgery    . Foot surgury  1980 and 1981   Past Medical History  Diagnosis Date  . DIABETES MELLITUS, TYPE II 07/17/2008  . B12 DEFICIENCY 07/17/2008  . Acute gouty arthropathy 12/11/2008  . HYPONATREMIA 03/20/2010  . HYPERKALEMIA 10/31/2009  . ANEMIA-NOS 07/17/2008  . ANXIETY 07/17/2008  . Chronic pain syndrome 02/14/2010  . Trigeminal neuralgia 02/14/2010  . HEARING LOSS, RIGHT EAR 06/04/2009  . HYPERTENSION 07/17/2008  . CHF 01/28/2009  . Pneumonia, organism unspecified 02/14/2010  . GERD 07/17/2008  . IBS 06/04/2009  . UNSPECIFIED HEPATITIS 01/28/2009  . RENAL INSUFFICIENCY 10/31/2009  . MENOPAUSAL DISORDER 12/03/2009  . Cellulitis and abscess of leg, except foot 11/15/2008  . SKIN ULCER, CHRONIC 02/08/2009  . Rheumatoid arthritis(714.0) 07/17/2008  . SKIN LESION 06/04/2009  . ARTHRITIS 01/28/2009  . FOOT PAIN 07/17/2008  . HYPERSOMNIA 02/14/2010  . Altered mental status 02/07/2010  . PERIPHERAL EDEMA 08/30/2008  . Wheezing 12/03/2009  . ABDOMINAL DISTENSION 10/11/2008  . Dysuria 11/11/2009  .  Hypoxemia 03/20/2010  . ANAPHYLACTIC SHOCK 05/15/2009  . NEUROPATHY, HX OF 01/28/2009  . COLONIC POLYPS, HX OF 07/17/2008  . AMPUTATION, BELOW KNEE, RIGHT, HX OF 01/28/2009  . Hyperlipemia 09/24/2010  . Hyperlipidemia 09/24/2010  . Seizure (HCC) 10/26/2010  . Pneumonia, aspiration (HCC) 12/01/2011    Episode July 2013, tx per [facility]  . Osteoporosis 11/16/2014  . Spinal stenosis of lumbar region 05/15/2015   There were no vitals taken for this visit.  Opioid Risk Score:   Fall Risk Score:  `1  Depression screen PHQ 2/9  Depression screen [facility] 2/9 02/25/2015 02/12/2015 01/15/2015 12/19/2014 11/20/2014 10/26/2014 09/27/2014  Decreased Interest 1 0 0 0 0 0 0  Down, Depressed, Hopeless 1 0  0 0 0 0 0  PHQ - 2 Score 2 0 0 0 0 0 0  Altered sleeping 3 - - - - - 1  Tired, decreased energy 3 - - - - - 0  Change in appetite - - - - - - 1  Feeling bad or failure about yourself  - - - - - - 1  Trouble concentrating 0 - - - - - 0  Moving slowly or fidgety/restless 0 - - - - - 0  Suicidal thoughts 0 - - - - - 0  PHQ-9 Score 8 - - - - - 3  Difficult doing work/chores Not difficult at all - - - - - -     Review of Systems     Objective:   Physical Exam  Constitutional: She is oriented to person, place, and time. She appears well-developed and well-nourished.  HENT:  Head: Normocephalic and atraumatic.  Neck: Normal range of motion. Neck supple.  Cardiovascular: Normal rate and regular rhythm.   Pulmonary/Chest: Effort normal and breath sounds normal.  Musculoskeletal:  Normal Muscle Bulk and Muscle Testing Reveals: Upper Extremities: Full ROM and Muscle Strength 5/5 Lumbar Paraspinal Tenderness: L-3- L-5 Lower Extremities: Right: BKA Left Lower Extremity with Decreased ROM and Muscle Strength 4/5 Left Lower Extremity Dressing Intact. Arrived in Wheelchair  Neurological: She is alert and oriented to person, place, and time.  Skin: Skin is warm and dry.  Psychiatric: She has a normal mood and affect.  Nursing note and vitals reviewed.         Assessment & Plan:  1.Right below-knee amputation, history of phantom limb pain.: Continue Current Medication Regime and Continue to Monitor. 2. Peripheral neuropathy: Continue Gabapentin.  Refilled: Fenatnyl Patch 75 mcg one patch every three days #10. Second script given to accommodate scheduled appointment. and Oxycodone 10/325mg  one tablet every 6 hours as needed #120  3. History of rheumatoid arthritis. Continue Current Medication and Exercise and heat Therapy.  4. Osteoarthritis of left knee: Continue with Voltaren gel/ Exercise and heat therapy.  5. Chronic cellulitis, left leg with associated edema:PCP Following/  Attending Wound Care twice a week. 6. Lumbar Spondylosis: Continue current medication regime. Continue HEP.  20 minutes of face to face patient care time was spent during this visit. All questions were encouraged and answered. F/U in 1 month

## 2015-08-09 DIAGNOSIS — L97222 Non-pressure chronic ulcer of left calf with fat layer exposed: Secondary | ICD-10-CM | POA: Diagnosis not present

## 2015-08-09 DIAGNOSIS — Z4800 Encounter for change or removal of nonsurgical wound dressing: Secondary | ICD-10-CM | POA: Diagnosis not present

## 2015-08-13 DIAGNOSIS — L98499 Non-pressure chronic ulcer of skin of other sites with unspecified severity: Secondary | ICD-10-CM | POA: Diagnosis not present

## 2015-08-13 DIAGNOSIS — E11622 Type 2 diabetes mellitus with other skin ulcer: Secondary | ICD-10-CM | POA: Diagnosis not present

## 2015-08-13 DIAGNOSIS — L97222 Non-pressure chronic ulcer of left calf with fat layer exposed: Secondary | ICD-10-CM | POA: Diagnosis not present

## 2015-08-13 DIAGNOSIS — S8012XD Contusion of left lower leg, subsequent encounter: Secondary | ICD-10-CM | POA: Diagnosis not present

## 2015-08-16 DIAGNOSIS — Z4800 Encounter for change or removal of nonsurgical wound dressing: Secondary | ICD-10-CM | POA: Diagnosis not present

## 2015-08-16 DIAGNOSIS — L97222 Non-pressure chronic ulcer of left calf with fat layer exposed: Secondary | ICD-10-CM | POA: Diagnosis not present

## 2015-08-19 ENCOUNTER — Other Ambulatory Visit: Payer: Self-pay | Admitting: Internal Medicine

## 2015-08-19 DIAGNOSIS — L98499 Non-pressure chronic ulcer of skin of other sites with unspecified severity: Secondary | ICD-10-CM | POA: Diagnosis not present

## 2015-08-19 DIAGNOSIS — L97222 Non-pressure chronic ulcer of left calf with fat layer exposed: Secondary | ICD-10-CM | POA: Diagnosis not present

## 2015-08-19 DIAGNOSIS — S8012XD Contusion of left lower leg, subsequent encounter: Secondary | ICD-10-CM | POA: Diagnosis not present

## 2015-08-19 DIAGNOSIS — E11622 Type 2 diabetes mellitus with other skin ulcer: Secondary | ICD-10-CM | POA: Diagnosis not present

## 2015-08-20 NOTE — Telephone Encounter (Signed)
Done hardcopy to Corinne  

## 2015-08-21 NOTE — Telephone Encounter (Signed)
Medication has been sent to pharmacy.  °

## 2015-08-22 DIAGNOSIS — Z4800 Encounter for change or removal of nonsurgical wound dressing: Secondary | ICD-10-CM | POA: Diagnosis not present

## 2015-08-22 DIAGNOSIS — L97222 Non-pressure chronic ulcer of left calf with fat layer exposed: Secondary | ICD-10-CM | POA: Diagnosis not present

## 2015-08-26 DIAGNOSIS — L97222 Non-pressure chronic ulcer of left calf with fat layer exposed: Secondary | ICD-10-CM | POA: Diagnosis not present

## 2015-09-02 DIAGNOSIS — L97222 Non-pressure chronic ulcer of left calf with fat layer exposed: Secondary | ICD-10-CM | POA: Diagnosis not present

## 2015-09-02 DIAGNOSIS — Z4800 Encounter for change or removal of nonsurgical wound dressing: Secondary | ICD-10-CM | POA: Diagnosis not present

## 2015-09-04 ENCOUNTER — Other Ambulatory Visit: Payer: Self-pay | Admitting: Internal Medicine

## 2015-09-06 DIAGNOSIS — L97222 Non-pressure chronic ulcer of left calf with fat layer exposed: Secondary | ICD-10-CM | POA: Diagnosis not present

## 2015-09-06 DIAGNOSIS — Z4800 Encounter for change or removal of nonsurgical wound dressing: Secondary | ICD-10-CM | POA: Diagnosis not present

## 2015-09-10 ENCOUNTER — Encounter: Payer: Medicare Other | Admitting: Registered Nurse

## 2015-09-10 DIAGNOSIS — L97222 Non-pressure chronic ulcer of left calf with fat layer exposed: Secondary | ICD-10-CM | POA: Diagnosis not present

## 2015-09-13 DIAGNOSIS — L97222 Non-pressure chronic ulcer of left calf with fat layer exposed: Secondary | ICD-10-CM | POA: Diagnosis not present

## 2015-09-13 DIAGNOSIS — Z4800 Encounter for change or removal of nonsurgical wound dressing: Secondary | ICD-10-CM | POA: Diagnosis not present

## 2015-09-16 DIAGNOSIS — L97222 Non-pressure chronic ulcer of left calf with fat layer exposed: Secondary | ICD-10-CM | POA: Diagnosis not present

## 2015-09-17 ENCOUNTER — Encounter: Payer: Self-pay | Admitting: Registered Nurse

## 2015-09-17 ENCOUNTER — Encounter: Payer: Medicare Other | Attending: Physical Medicine and Rehabilitation | Admitting: Registered Nurse

## 2015-09-17 VITALS — BP 131/50 | HR 81

## 2015-09-17 DIAGNOSIS — M069 Rheumatoid arthritis, unspecified: Secondary | ICD-10-CM | POA: Insufficient documentation

## 2015-09-17 DIAGNOSIS — M179 Osteoarthritis of knee, unspecified: Secondary | ICD-10-CM | POA: Diagnosis not present

## 2015-09-17 DIAGNOSIS — G894 Chronic pain syndrome: Secondary | ICD-10-CM

## 2015-09-17 DIAGNOSIS — G546 Phantom limb syndrome with pain: Secondary | ICD-10-CM | POA: Diagnosis not present

## 2015-09-17 DIAGNOSIS — M4716 Other spondylosis with myelopathy, lumbar region: Secondary | ICD-10-CM | POA: Diagnosis not present

## 2015-09-17 DIAGNOSIS — Z5181 Encounter for therapeutic drug level monitoring: Secondary | ICD-10-CM

## 2015-09-17 DIAGNOSIS — G609 Hereditary and idiopathic neuropathy, unspecified: Secondary | ICD-10-CM

## 2015-09-17 DIAGNOSIS — Z79899 Other long term (current) drug therapy: Secondary | ICD-10-CM

## 2015-09-17 DIAGNOSIS — Z89519 Acquired absence of unspecified leg below knee: Secondary | ICD-10-CM | POA: Diagnosis not present

## 2015-09-17 MED ORDER — FENTANYL 75 MCG/HR TD PT72: 75.0000 ug | MEDICATED_PATCH | TRANSDERMAL | Status: DC

## 2015-09-17 MED ORDER — OXYCODONE-ACETAMINOPHEN 10-325 MG PO TABS: 1.0000 | ORAL_TABLET | Freq: Four times a day (QID) | ORAL | Status: DC | PRN

## 2015-09-17 NOTE — Progress Notes (Signed)
Subjective:    Patient ID: Crystal Fitzpatrick, female    DOB: 1946/01/22, 70 y.o.   MRN: [id number]  HPI: [name] is a 70 year old female who returns for follow up for chronic pain and medication refill. She states her pain is located in her lower back and left lower extremity. She rates her pain 7.Her current exercise regime is yoga daily. Also states she's receiving wound care at [facility] twice a week. Left lower extremity dressing intact.  Stated she was leaning in her wheelchair and fell out and landed on her buttocks. She called EMS and they picked her up.  [name] wanted to have an appointment with physical therapist Vladimir Faster to help assist her with order th correct manual wheelchair. Referral placed.  Pain Inventory Average Pain 8 Pain Right Now 7 My pain is intermittent, constant, sharp, burning, stabbing and aching  In the last 24 hours, has pain interfered with the following? General activity 4 Relation with others 6 Enjoyment of life 3 What TIME of day is your pain at its worst? morning and night Sleep (in general) Poor  Pain is worse with: some activites Pain improves with: rest and medication Relief from Meds: 6  Mobility use a wheelchair transfers alone  Function I need assistance with the following:  shopping  Neuro/Psych No problems in this area  Prior Studies Any changes since last visit?  no  Physicians involved in your care Any changes since last visit?  no   Family History  Problem Relation Age of Onset  . Breast cancer Sister   . Diabetes Other   . Stroke Other     Grandparents   Social History   Social History  . Marital Status: Divorced    Spouse Name: N/A  . Number of Children: 1  . Years of Education: N/A   Occupational History  . retired Marketing executive    Social History Main Topics  . Smoking status: Former Games developer  . Smokeless tobacco: Never Used  . Alcohol Use: No  . Drug  Use: No  . Sexual Activity: Not Asked   Other Topics Concern  . None   Social History Narrative   Past Surgical History  Procedure Laterality Date  . Amputation  02/02/2008    below right knee  . S/p egd  2007    with Botox for? Achalasia  . Tonsillectomy    . S/p breast biopsy  1992  . 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 Tubal ligation    . Nasal septum surgery    . Foot surgury  1980 and 1981   Past Medical History  Diagnosis Date  . DIABETES MELLITUS, TYPE II 07/17/2008  . B12 DEFICIENCY 07/17/2008  . Acute gouty arthropathy 12/11/2008  . HYPONATREMIA 03/20/2010  . HYPERKALEMIA 10/31/2009  . ANEMIA-NOS 07/17/2008  . ANXIETY 07/17/2008  . Chronic pain syndrome 02/14/2010  . Trigeminal neuralgia 02/14/2010  . HEARING LOSS, RIGHT EAR 06/04/2009  . HYPERTENSION 07/17/2008  . CHF 01/28/2009  . Pneumonia, organism unspecified 02/14/2010  . GERD 07/17/2008  . IBS 06/04/2009  . UNSPECIFIED HEPATITIS 01/28/2009  . RENAL INSUFFICIENCY 10/31/2009  . MENOPAUSAL DISORDER 12/03/2009  . Cellulitis and abscess of leg, except foot 11/15/2008  . SKIN ULCER, CHRONIC 02/08/2009  . Rheumatoid arthritis(714.0) 07/17/2008  . SKIN LESION 06/04/2009  . ARTHRITIS 01/28/2009  . FOOT PAIN 07/17/2008  . HYPERSOMNIA 02/14/2010  . Altered  mental status 02/07/2010  . PERIPHERAL EDEMA 08/30/2008  . Wheezing 12/03/2009  . ABDOMINAL DISTENSION 10/11/2008  . Dysuria 11/11/2009  . Hypoxemia 03/20/2010  . ANAPHYLACTIC SHOCK 05/15/2009  . NEUROPATHY, HX OF 01/28/2009  . COLONIC POLYPS, HX OF 07/17/2008  . AMPUTATION, BELOW KNEE, RIGHT, HX OF 01/28/2009  . Hyperlipemia 09/24/2010  . Hyperlipidemia 09/24/2010  . Seizure (HCC) 10/26/2010  . Pneumonia, aspiration (HCC) 12/01/2011    Episode July 2013, tx per [facility]  . Osteoporosis 11/16/2014  . Spinal stenosis of lumbar region 05/15/2015   BP 131/50 mmHg  Pulse 81  SpO2 91%  Opioid Risk Score:   Fall Risk Score:  `1  Depression screen PHQ  2/9  Depression screen [facility] 2/9 09/17/2015 02/25/2015 02/12/2015 01/15/2015 12/19/2014 11/20/2014 10/26/2014  Decreased Interest 0 1 0 0 0 0 0  Down, Depressed, Hopeless 0 1 0 0 0 0 0  PHQ - 2 Score 0 2 0 0 0 0 0  Altered sleeping - 3 - - - - -  Tired, decreased energy - 3 - - - - -  Change in appetite - - - - - - -  Feeling bad or failure about yourself  - - - - - - -  Trouble concentrating - 0 - - - - -  Moving slowly or fidgety/restless - 0 - - - - -  Suicidal thoughts - 0 - - - - -  PHQ-9 Score - 8 - - - - -  Difficult doing work/chores - Not difficult at all - - - - -    Review of Systems  Constitutional: Positive for fever and chills.  Skin: Positive for wound.  All other systems reviewed and are negative.      Objective:   Physical Exam  Constitutional: She is oriented to person, place, and time. She appears well-developed and well-nourished.  HENT:  Head: Normocephalic and atraumatic.  Neck: Normal range of motion. Neck supple.  Cardiovascular: Normal rate and regular rhythm.   Pulmonary/Chest: Effort normal and breath sounds normal.  Musculoskeletal:  Normal Muscle Bulk and Muscle Testing Reveals: Upper Extremities: Full ROM and Muscle Strength 5/5 Lumbar Paraspinal Tenderness: L-3- L-5 Lower Extremities: Right BKA Left Lower Extremities: Decreased ROM and Muscle Strength 5/5 Left Lower Extremity Dressing intact Arrived in wheelchair  Neurological: She is alert and oriented to person, place, and time.  Skin: Skin is warm and dry.  Psychiatric: She has a normal mood and affect.  Nursing note and vitals reviewed.         Assessment & Plan:  1.Right below-knee amputation, history of phantom limb pain.: Continue Current Medication Regime and Continue to Monitor.  2. Peripheral neuropathy: Continue Gabapentin.  Refilled: Fenatnyl Patch 75 mcg one patch every three days #10. and Oxycodone 10/325mg  one tablet every 6 hours as needed #120. We will continue the opioid  monitoring program, this consists of regular clinic visits, examinations, urine drug screen, pill counts as well as use of [place] Controlled Substance Reporting System. 3. History of rheumatoid arthritis. Continue Current Medication and Exercise and heat Therapy.  4. Osteoarthritis of left knee: Continue with Voltaren gel/ Exercise and heat therapy.  5. Chronic cellulitis, left leg with associated edema:PCP Following/ Attending Wound Care twice a week. 6. Lumbar Spondylosis: Continue current medication regime. Continue HEP.  20 minutes of face to face patient care time was spent during this visit. All questions were encouraged and answered.  F/U in 1 month

## 2015-09-18 DIAGNOSIS — L97222 Non-pressure chronic ulcer of left calf with fat layer exposed: Secondary | ICD-10-CM | POA: Diagnosis not present

## 2015-09-18 DIAGNOSIS — Z4800 Encounter for change or removal of nonsurgical wound dressing: Secondary | ICD-10-CM | POA: Diagnosis not present

## 2015-09-23 DIAGNOSIS — L089 Local infection of the skin and subcutaneous tissue, unspecified: Secondary | ICD-10-CM | POA: Diagnosis not present

## 2015-09-26 ENCOUNTER — Ambulatory Visit: Payer: Medicare Other | Admitting: Internal Medicine

## 2015-09-26 ENCOUNTER — Telehealth: Payer: Self-pay | Admitting: Internal Medicine

## 2015-09-26 NOTE — Telephone Encounter (Signed)
Very nice, thanks for the update, see you at next visit.

## 2015-09-26 NOTE — Telephone Encounter (Signed)
FYI:  Wanted Dr. Jonny Ruiz to know that she came in to see Naomie Dean and [facility] sent her over the [facility].  States she is now seeing wound care because of a hematoma on her leg.  States she cancelled her 6 month fu due to this but will reschedule once everything settles down.  States she was very happy with the care she got from [facility].

## 2015-09-30 DIAGNOSIS — L97222 Non-pressure chronic ulcer of left calf with fat layer exposed: Secondary | ICD-10-CM | POA: Diagnosis not present

## 2015-10-02 ENCOUNTER — Other Ambulatory Visit: Payer: Self-pay | Admitting: Physical Medicine & Rehabilitation

## 2015-10-07 DIAGNOSIS — L97222 Non-pressure chronic ulcer of left calf with fat layer exposed: Secondary | ICD-10-CM | POA: Diagnosis not present

## 2015-10-16 DIAGNOSIS — L97222 Non-pressure chronic ulcer of left calf with fat layer exposed: Secondary | ICD-10-CM | POA: Diagnosis not present

## 2015-10-21 DIAGNOSIS — L97222 Non-pressure chronic ulcer of left calf with fat layer exposed: Secondary | ICD-10-CM | POA: Diagnosis not present

## 2015-10-23 ENCOUNTER — Other Ambulatory Visit: Payer: Self-pay

## 2015-10-23 MED ORDER — TOPIRAMATE 100 MG PO TABS: ORAL_TABLET | ORAL | Status: DC

## 2015-10-23 NOTE — Telephone Encounter (Signed)
Pt was connected to my line accidentally requesting a refill of Topamax.  Would you please refill this medication for this patient in Dr Raphael Gibney absence? Pt says she has left several messages with Dr Raphael Gibney assistant and has had to reduce her medication to 1 tablet QD to keep form completely running out.  Thank you!

## 2015-10-23 NOTE — Telephone Encounter (Signed)
Disregard previous message to covering provider - medication can be refilled by CMA.  Pt advised refill sent to pharmacy

## 2015-10-28 DIAGNOSIS — L97222 Non-pressure chronic ulcer of left calf with fat layer exposed: Secondary | ICD-10-CM | POA: Diagnosis not present

## 2015-10-30 ENCOUNTER — Encounter: Payer: Self-pay | Admitting: Registered Nurse

## 2015-10-30 ENCOUNTER — Encounter: Payer: Medicare Other | Attending: Physical Medicine and Rehabilitation | Admitting: Registered Nurse

## 2015-10-30 VITALS — BP 124/77 | HR 75 | Resp 16

## 2015-10-30 DIAGNOSIS — Z89519 Acquired absence of unspecified leg below knee: Secondary | ICD-10-CM | POA: Diagnosis not present

## 2015-10-30 DIAGNOSIS — G894 Chronic pain syndrome: Secondary | ICD-10-CM | POA: Diagnosis not present

## 2015-10-30 DIAGNOSIS — G546 Phantom limb syndrome with pain: Secondary | ICD-10-CM

## 2015-10-30 DIAGNOSIS — M069 Rheumatoid arthritis, unspecified: Secondary | ICD-10-CM | POA: Diagnosis not present

## 2015-10-30 DIAGNOSIS — Z5181 Encounter for therapeutic drug level monitoring: Secondary | ICD-10-CM

## 2015-10-30 DIAGNOSIS — M4716 Other spondylosis with myelopathy, lumbar region: Secondary | ICD-10-CM | POA: Insufficient documentation

## 2015-10-30 DIAGNOSIS — Z79899 Other long term (current) drug therapy: Secondary | ICD-10-CM

## 2015-10-30 DIAGNOSIS — M179 Osteoarthritis of knee, unspecified: Secondary | ICD-10-CM | POA: Insufficient documentation

## 2015-10-30 DIAGNOSIS — G609 Hereditary and idiopathic neuropathy, unspecified: Secondary | ICD-10-CM | POA: Insufficient documentation

## 2015-10-30 DIAGNOSIS — L03116 Cellulitis of left lower limb: Secondary | ICD-10-CM

## 2015-10-30 MED ORDER — FENTANYL 75 MCG/HR TD PT72: 75.0000 ug | MEDICATED_PATCH | TRANSDERMAL | Status: DC
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
## 2015-10-30 NOTE — Patient Instructions (Signed)
[facility]     [email or web address]

## 2015-11-05 DIAGNOSIS — S8012XD Contusion of left lower leg, subsequent encounter: Secondary | ICD-10-CM | POA: Diagnosis not present

## 2015-11-05 DIAGNOSIS — L97222 Non-pressure chronic ulcer of left calf with fat layer exposed: Secondary | ICD-10-CM | POA: Diagnosis not present

## 2015-11-05 DIAGNOSIS — E11622 Type 2 diabetes mellitus with other skin ulcer: Secondary | ICD-10-CM | POA: Diagnosis not present

## 2015-11-07 ENCOUNTER — Ambulatory Visit: Payer: Medicare Other | Attending: Registered Nurse | Admitting: Physical Therapy

## 2015-11-07 DIAGNOSIS — M6281 Muscle weakness (generalized): Secondary | ICD-10-CM | POA: Insufficient documentation

## 2015-11-07 LAB — TOXASSURE SELECT,+ANTIDEPR,UR: PDF: 0

## 2015-11-07 NOTE — Therapy (Signed)
[facility] [address] [place], [place], [address] Phone: [phone]   Fax:  [phone]  Physical Therapy Evaluation  Patient Details  Name: Crystal Fitzpatrick MRN: [id number] Date of Birth: 1945/06/16 No Data Recorded  Encounter Date: 11/07/2015      PT End of Session - 11/07/15 1730    Visit Number 1   Number of Visits 1   Authorization Type wheelchair eval only   PT Start Time 1402   PT Stop Time 1520   PT Time Calculation (min) 78 min   Activity Tolerance Patient tolerated treatment well   Behavior During Therapy [facility] for tasks assessed/performed      Past Medical History  Diagnosis Date  . DIABETES MELLITUS, TYPE II 07/17/2008  . B12 DEFICIENCY 07/17/2008  . Acute gouty arthropathy 12/11/2008  . HYPONATREMIA 03/20/2010  . HYPERKALEMIA 10/31/2009  . ANEMIA-NOS 07/17/2008  . ANXIETY 07/17/2008  . Chronic pain syndrome 02/14/2010  . Trigeminal neuralgia 02/14/2010  . HEARING LOSS, RIGHT EAR 06/04/2009  . HYPERTENSION 07/17/2008  . CHF 01/28/2009  . Pneumonia, organism unspecified 02/14/2010  . GERD 07/17/2008  . IBS 06/04/2009  . UNSPECIFIED HEPATITIS 01/28/2009  . RENAL INSUFFICIENCY 10/31/2009  . MENOPAUSAL DISORDER 12/03/2009  . Cellulitis and abscess of leg, except foot 11/15/2008  . SKIN ULCER, CHRONIC 02/08/2009  . Rheumatoid arthritis(714.0) 07/17/2008  . SKIN LESION 06/04/2009  . ARTHRITIS 01/28/2009  . FOOT PAIN 07/17/2008  . HYPERSOMNIA 02/14/2010  . Altered mental status 02/07/2010  . PERIPHERAL EDEMA 08/30/2008  . Wheezing 12/03/2009  . ABDOMINAL DISTENSION 10/11/2008  . Dysuria 11/11/2009  . Hypoxemia 03/20/2010  . ANAPHYLACTIC SHOCK 05/15/2009  . NEUROPATHY, HX OF 01/28/2009  . COLONIC POLYPS, HX OF 07/17/2008  . AMPUTATION, BELOW KNEE, RIGHT, HX OF 01/28/2009  . Hyperlipemia 09/24/2010  . Hyperlipidemia 09/24/2010  . Seizure ([facility]) 10/26/2010  . Pneumonia, aspiration ([place]) 12/01/2011    Episode July 2013, tx per [facility]  . Osteoporosis 11/16/2014  . Spinal stenosis of lumbar region 05/15/2015    Past Surgical History  Procedure Laterality Date  . Amputation  02/02/2008    below right knee  . S/p egd  2007    with Botox for? Achalasia  . Tonsillectomy    . S/p breast biopsy  1992  . 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 Tubal ligation    . Nasal septum surgery    . Foot surgury  1980 and 1981    There were no vitals filed for this visit.       Subjective Assessment - 11/07/15 1400    Subjective Patient presents for manual wheelchair evaluation due to poor condition of current w/c including multiple falls out of chair.    Patient is accompained by: Family member   Patient Stated Goals get new w/c       Mobility/Seating Evaluation    PATIENT INFORMATION: Name: Crystal Fitzpatrick DOB: 01-27-1946  Sex: Female Date seen: 11/07/2015 Time: 14:00  Address:  [address] [place] [address] Physician: Danella Sensing, PA This evaluation/justification form will serve as the LMN for the following suppliers: __________________________ Supplier: [employer] Contact Person: Luz Brazen, ATP Phone:  [phone]   Seating Therapist: Jamey Reas, PT Phone:   [phone]   Phone: [phone]    Spouse/Parent/Caregiver name: Cecille Amsterdam, friend/caregiver  Phone number: [phone] Insurance/Payer: Medicare     Reason for Referral: Manual w/c  Patient/Caregiver Goals:  Safe wheelchair to maneuver around condo that is light as possible.   Patient was seen for face-to-face evaluation for replacement manual wheelchair.  Also present was Cecille Amsterdam, friend & Luz Brazen, ATP to discuss recommendations and wheelchair options.  Further paperwork was completed and sent to vendor.  Patient appears to qualify for manual mobility device at this time per objective findings.   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    History including Falls: [place] out of bed one time rolling over with no injuries. She reports she has slipped out of chair 5 times since Jan. 2017 with no injuries with EMS assistance required to get up.     HOME ENVIRONMENT: [] House  [x] Condo/town home  [] Apartment  [] Assisted Living    [x] Lives Alone []  Lives with Others                                                                                          Hours with caregiver: ~1 hour /day.   [x] Home is accessible to patient           Stairs      [] Yes []  No     Ramp [x] Yes [] No Comments:  First floor single level condo with ramped entrance and ramped from patio. Hardwood floors. She has removed doors to fit current 28.5" doors.    COMMUNITY ADL: TRANSPORTATION: [x] Car    [] Van    [] Public Transportation    [] Adapted w/c Lift    [] Ambulance    [] Other:       [] Sits in wheelchair during transport  Employment/School: disabled Specific requirements pertaining to mobility ?????  Other: ?????    FUNCTIONAL/SENSORY PROCESSING SKILLS:  Handedness:   [x] Right     [] Left    [] NA  Comments:  ?????  Functional Processing Skills for Wheeled Mobility [] Processing Skills are adequate for safe wheelchair operation  Areas of concern than may interfere with safe operation of wheelchair Description of problem   []  Attention to environment      [] Judgment      []  Hearing  []  Vision or visual processing      [] Motor  Planning  []  Fluctuations in Behavior  ?????    VERBAL COMMUNICATION: [x] WFL receptive [x]  WFL expressive [x] Understandable  [] Difficult to understand  [] non-communicative []  Uses an augmented communication device  CURRENT SEATING / MOBILITY: Current Mobility Base:  [] None [] Dependent [x] Manual [] Scooter [] Power  Type of Control: ?????  Manufacturer:  Everest & JenningsSize:  42" X 18"Age: 33-5 yrs old  Current Condition of Mobility Base:  poor - brakes are broken, tires are worn, upholestry stretched, no left arm rests. No foot  rests but at home.    Current Wheelchair components:  removable armrests, swing away foot rest,   Describe posture in present seating system:  Sacral sits, sliding out of front of chair, kyphosis, decreased lordosis, leans to right.       SENSATION and SKIN ISSUES: Sensation [x] Intact  [] Impaired [] Absent  Level of sensation: ????? Pressure Relief: Able to perform effective pressure relief :    [] Yes  [x]  No Method: lifts with UEs & left LE  If not, Why?: Can not maintain pressure relief long enough to be effective  Skin Issues/Skin Integrity Current Skin Issues  [x] Yes [] No [] Intact []  Red area[x]  Open Area  [] Scar Tissue [x] At risk from prolonged sitting Where  right ischium, left lateral lower leg  History of Skin Issues  [x] Yes [] No Where  shingles on back When  30 yrs ago  Hx of skin flap surgeries  [] Yes [x] No Where  ????? When  ?????  Limited sitting tolerance [x] Yes [] No Hours spent sitting in wheelchair daily: all awake hours 16+ hours, gets in bed for short period for pressure relief  Complaint of Pain:  Please describe: She reports pain every where similar to flu. Shoots from feet (nerve pain) upwards. Back & upper back / shoulders. left Knee. Currently 6-7/10, worst in last week 6-7/10, Best 3-4/10.    Swelling/Edema: Swelling thigh down and right limb.    ADL STATUS (in reference to wheelchair use):  Indep Assist Unable Indep with Equip Not  assessed Comments  Dressing ????? ????? ????? X ????? Sits on toilet for pants & upper body in w/c.  Eating X ????? ????? ????? ????? sits in w/c  Toileting ????? ????? ????? X ????? Raised toilet  Bathing ????? ????? ????? X ????? sponge bath sitting on toilet  Grooming/Hygiene ????? ????? ????? X ????? Sitting in w/c  Meal Prep ????? ????? ????? X ????? microwave or sandwiches. Cannot cook.  IADLS ????? X ????? ????? ????? caregiver assists.   Bowel Management: [x] Continent  [] Incontinent  [x] Accidents Comments:  Uses Depends but reports makes it to toilet mostly  Bladder Management: [x] Continent  [] Incontinent  [x] Accidents Comments:  Uses Depends.     WHEELCHAIR SKILLS: Manual w/c Propulsion: [] UE or LE strength and endurance sufficient to participate in ADLs using manual wheelchair Arm : [] left [] right   [] Both      Distance: ????? Foot:  [] left [] right   [] Both  Operate Scooter: []  Strength, hand grip, balance and transfer appropriate for use [] Living environment is accessible for use of scooter  Operate Power w/c:  []  Std. Joystick   []  Alternative Controls Indep []  Assist []  Dependent/unable []  N/A []   [] Safe          []  Functional      Distance: ?????  Bed confined without wheelchair []  Yes []  No   STRENGTH/RANGE OF MOTION:  Active Range of Motion Strength  Shoulder WFL Grossly 5/5  Elbow WFL Grossly 5/5  Wrist/Hand WFL Good grip  Hip hip flexion 90* hip flexion 4/5  Knee Left knee extension -35* left knee extension 3-/5, right knee extension 4/5  Ankle Left ankle dorsiflexion +5 dorsiflexion 3-/5     MOBILITY/BALANCE:  []  Patient is totally dependent for mobility  ?????    Balance Transfers Ambulation  Sitting Balance: Standing Balance: [x]  Independent []  Independent/Modified Independent  [x]  WFL     []  WFL []  Supervision []  Supervision  []  Uses UE for balance  []  Supervision []  Min Assist []  Ambulates with Assist  ?????    []   Min Assist []  Min assist []  Mod  Assist []  Ambulates with Device:      []  RW  []  StW  []  Cane  []  ?????  []  Mod Assist []  Mod assist []  Max assist   []  Max Assist []  Max assist []  Dependent []  Indep. Short Distance Only  []  Unable [x]  Unable []  Lift / Sling Required Distance (in feet)  ?????   []  Sliding board [x]  Unable to Ambulate (see explanation below)  Cardio Status:  [] Intact  [x]  Impaired   []  NA     ?????  Respiratory Status:  [] Intact   [] Impaired   [] NA     ?????  Orthotics/Prosthetics: does not wear prosthesis  Comments (Address manual vs power w/c vs scooter): Resting HR 80, Sp02 96%. Transfers scooting with armrests removed. Left LE too painful & weak for standing or gait. Propelled wheelchair around furniture with UEs safely. HR 88 SpO2 93%.         Anterior / Posterior Obliquity Rotation-Pelvis ?????  PELVIS    []  [x]  []   Neutral Posterior Anterior  []  [x]  []   WFL Rt elev Lt elev  [x]  []  []   WFL Right Left                      Anterior    Anterior     []  Fixed []  Other [x]  Partly Flexible []  Flexible   []  Fixed []  Other [x]  Partly Flexible  []  Flexible  []  Fixed []  Other [x]  Partly Flexible  []  Flexible   TRUNK  []  [x]  []   WFL ? Thoracic ? Lumbar  Kyphosis Lordosis  [x]  []  []   WFL Convex Convex  Right Left [] c-curve [] s-curve [] multiple  []  Neutral [x]  Left-anterior []  Right-anterior     []  Fixed []  Flexible [x]  Partly Flexible []  Other  []  Fixed []  Flexible []  Partly Flexible []  Other  []  Fixed             [x]  Flexible []  Partly Flexible []  Other    Position Windswept  ?????  HIPS          []            [x]               []    Neutral       Abduct        ADduct         [x]           []            []   Neutral Right           Left      []  Fixed []  Subluxed []  Partly Flexible []  Dislocated [x]  Flexible  []  Fixed []  Other []  Partly Flexible  []  Flexible                 Foot Positioning Knee Positioning  NA for right foot as BKA    [x]  WFL  [] Lt [] Rt [x]  WFL  [] Lt [] Rt    KNEES  ROM concerns: ROM concerns:    & Dorsi-Flexed [] Lt [] Rt ?????    FEET Plantar Flexed [] Lt [] Rt      Inversion                 [] Lt [] Rt      Eversion                 [] Lt [] Rt     HEAD [x]   Functional [x]  Good Head Control  head forward with rounded shoulder posture  & []  Flexed         []  Extended []  Adequate Head Control    NECK []  Rotated  Lt  []  Lat Flexed Lt []  Rotated  Rt []  Lat Flexed Rt []  Limited Head Control     []  Cervical Hyperextension []  Absent  Head Control     SHOULDERS ELBOWS WRIST& HAND ?????      Left     Right    Left     Right    Left     Right   U/E [x] Functional           [x] Functional WFL WFL [] Fisting             [] Fisting      [] elev   [] dep      [] elev   [] dep       [] pro -[] retract     [] pro  [] retract [] subluxed             [] subluxed           Goals for Wheelchair Mobility  [x]  Independence with mobility in the home with motor related ADLs (MRADLs)  [x]  Independence with MRADLs in the community []  Provide dependent mobility  []  Provide recline     [] Provide tilt   Goals for Seating system [x]  Optimize pressure distribution [x]  Provide support needed to facilitate function or safety [x]  Provide corrective forces to assist with maintaining or improving posture [x]  Accommodate client's posture:   current seated postures and positions are not flexible or will not tolerate corrective forces [x]  Client to be independent with relieving pressure in the wheelchair [x] Enhance physiological function such as breathing, swallowing, digestion  Simulation ideas/Equipment trials:already using manual w/c State why other equipment was unsuccessful:?????   MOBILITY BASE RECOMMENDATIONS and JUSTIFICATION: MOBILITY COMPONENT JUSTIFICATION  Manufacturer: [employer] MobilityModel: Catalyst 5   Size: Width 20"Seat Depth 20" [x] provide transport from point A to B      [x] promote Indep mobility  [x] is not a safe, functional ambulator [x] walker or cane inadequate [x] non-standard  width/depth necessary to accommodate anatomical measurement []  ?????  [x] Manual Mobility Base [x] non-functional ambulator    [] Scooter/POV  [] can safely operate  [] can safely transfer   [] has adequate trunk stability  [] cannot functionally propel manual w/c  [] Power Mobility Base  [] non-ambulatory  [] cannot functionally propel manual wheelchair  []  cannot functionally and safely operate scooter/POV [] can safely operate and willing to  [] Stroller Base [] infant/child  [] unable to propel manual wheelchair [] allows for growth [] non-functional ambulator [] non-functional UE [] Indep mobility is not a goal at this time  [] Tilt  [] Forward [] Backward [] Powered tilt  [] Manual tilt  [] change position against gravitational force on head and shoulders  [] change position for pressure relief/cannot weight shift [] transfers  [] management of tone [] rest periods [] control edema [] facilitate postural control  []  ?????  [] Recline  [] Power recline on power base [] Manual recline on manual base  [] accommodate femur to back angle  [] bring to full recline for ADL care  [] change position for pressure relief/cannot weight shift [] rest periods [] repositioning for transfers or clothing/diaper /catheter changes [] head positioning  [x] Lighter weight required [x] self- propulsion  [] lifting []  ?????  [] Heavy Duty required [] user weight greater than 250# [] extreme tone/ over active movement [] broken frame on previous chair []  ?????  [x]  Back  []  Angle Adjustable []  Custom molded tension adjustable  [x] postural control [] control of tone/spasticity [x] accommodation of range of motion [x] UE functional  control [x] accommodation for seating system []  ????? [] provide lateral trunk support [] accommodate deformity [x] provide posterior trunk support [] provide lumbar/sacral support [x] support trunk in midline [] Pressure relief over spinal processes  [x]  Seat Cushion comfort company skin protection with  incontinence cover [x] impaired sensation  [x] decubitus ulcers present [] history of pressure ulceration [] prevent pelvic extension [x] low maintenance  [x] stabilize pelvis  [] accommodate obliquity [x] accommodate multiple deformity [] neutralize lower extremity position [x] increase pressure distribution []  ?????  []  Pelvic/thigh support  []  Lateral thigh guide []  Distal medial pad  []  Distal lateral pad []  pelvis in neutral [] accommodate pelvis []  position upper legs []  alignment []  accommodate ROM []  decr adduction [] accommodate tone [] removable for transfers [] decr abduction  []  Lateral trunk Supports []  Lt     []  Rt [] decrease lateral trunk leaning [] control tone [] contour for increased contact [] safety  [] accommodate asymmetry []  ?????  []  Mounting hardware  [] lateral trunk supports  [] back   [] seat [] headrest      []  thigh support [] fixed   [] swing away [] attach seat platform/cushion to w/c frame [] attach back cushion to w/c frame [] mount postural supports [] mount headrest  [] swing medial thigh support away [] swing lateral supports away for transfers  []  ?????    Armrests  [] fixed [x] adjustable height [] removable   [] swing away  [x] flip back   [] reclining [x] full length pads [] desk    [] pads tubular  [x] provide support with elbow at 90   [] provide support for w/c tray [x] change of height/angles for variable activities [x] remove for transfers [x] allow to come closer to table top [x] remove for access to tables []  ?????  Hangers/ Leg rests  [] 60 [x] 70 [] 90 [] elevating [] heavy duty  [] articulating [] fixed [] lift off [x] swing away     [] power [x] provide LE support  [] accommodate to hamstring tightness [] elevate legs during recline   [] provide change in position for Legs [x] Maintain placement of feet on footplate [x] durability [x] enable transfers [] decrease edema [] Accommodate lower leg length []  ?????  Foot support Footplate    [x] Lt  []  Rt  []  Center  mount [x] flip up     [x] depth/angle adjustable [x] Amputee adapter    []  Lt     [x]  Rt [x] provide foot support [x] accommodate to ankle ROM [x] transfers [x] Provide support for residual extremity [x]  allow foot to go under wheelchair base []  decrease tone  []  ?????  [x]  Ankle strap/heel loops [x] support foot on foot support [] decrease extraneous movement [x] provide input to heel  [x] protect foot  Tires: [x] pneumatic  [x] flat free inserts  [] solid  [x] decrease maintenance  [x] prevent frequent flats [] increase shock absorbency [] decrease pain from road shock [] decrease spasms from road shock []  ?????  []  Headrest  [] provide posterior head support [] provide posterior neck support [] provide lateral head support [] provide anterior head support [] support during tilt and recline [] improve feeding   [] improve respiration [] placement of switches [] safety  [] accommodate ROM  [] accommodate tone [] improve visual orientation  []  Anterior chest strap []  Vest []  Shoulder retractors  [] decrease forward movement of shoulder [] accommodation of TLSO [] decrease forward movement of trunk [] decrease shoulder elevation [] added abdominal support [] alignment [] assistance with shoulder control  []  ?????  Pelvic Positioner [x] Belt [] SubASIS bar [] Dual Pull [] stabilize tone [x] decrease falling out of chair/ **will not Decr potential for sliding due to pelvic tilting [] prevent excessive rotation [] pad for protection over boney prominence [] prominence comfort [] special pull angle to control rotation []  ?????  Upper Extremity Support [] L   []  R [] Arm trough    [] hand support []  tray       [] full tray [] swivel mount [] decrease  edema      [] decrease subluxation   [] control tone   [] placement for AAC/Computer/EADL [] decrease gravitational pull on shoulders [] provide midline positioning [] provide support to increase UE function [] provide hand support in natural position [] provide work surface    POWER WHEELCHAIR CONTROLS  [] Proportional  [] Non-Proportional Type ????? [] Left  [] Right [] provides access for controlling wheelchair   [] lacks motor control to operate proportional drive control [] unable to understand proportional controls  Actuator Control Module  [] Single  [] Multiple   [] Allow the client to operate the power seat function(s) through the joystick control   [] Safety Reset Switches [] Used to change modes and stop the wheelchair when driving in latch mode    [] Upgraded Electronics   [] programming for accurate control [] progressive Disease/changing condition [] non-proportional drive control needed [] Needed in order to operate power seat functions through joystick control   [] Display box [] Allows user to see in which mode and drive the wheelchair is set  [] necessary for alternate controls    [] Digital interface electronics [] Allows w/c to operate when using alternative drive controls  [] ASL Head Array [] Allows client to operate wheelchair  through switches placed in tri-panel headrest  [] Sip and puff with tubing kit [] needed to operate sip and puff drive controls  [] Upgraded tracking electronics [] increase safety when driving [] correct tracking when on uneven surfaces  [] Mount for switches or joystick [] Attaches switches to w/c  [] Swing away for access or transfers [] midline for optimal placement [] provides for consistent access  [] Attendant controlled joystick plus mount [] safety [] long distance driving [] operation of seat functions [] compliance with transportation regulations []  ?????    Rear wheel placement/Axle adjustability [] None [] semi adjustable [x] fully adjustable  [x] improved UE access to wheels [x] improved stability [x] changing angle in space for improvement of postural stability [] 1-arm drive access [] amputee pad placement []  ?????  Wheel rims/ hand rims  [x] metal  [] plastic coated [] oblique projections [] vertical projections [x] Provide ability to  propel manual wheelchair  []  Increase self-propulsion with hand weakness/decreased grasp  Push handles [] extended  [] angle adjustable  [] standard [] caregiver access [] caregiver assist [] allows "hooking" to enable increased ability to perform ADLs or maintain balance  One armed device  [] Lt   [] Rt [] enable propulsion of manual wheelchair with one arm   []  ?????   Brake/wheel lock extension []  Lt   []  Rt [] increase indep in applying wheel locks   [] Side guards [] prevent clothing getting caught in wheel or becoming soiled []  prevent skin tears/abrasions  Battery: ????? [] to power wheelchair ?????  Other: Anti-tippers prevent tipping backwards ?????  The above equipment has a life- long use expectancy. Growth and changes in medical and/or functional conditions would be the exceptions. This is to certify that the therapist has no financial relationship with durable medical provider or manufacturer. The therapist will not receive remuneration of any kind for the equipment recommended in this evaluation.   Patient has mobility limitation that significantly impairs safe, timely participation in one or more mobility related ADL's.  (bathing, toileting, feeding, dressing, grooming, moving from room to room)                                                             [x]  Yes []  No Will mobility device sufficiently improve ability to participate and/or be aided in participation of MRADL's?         [  x] Yes []  No Can limitation be compensated for with use of a cane or walker?                                                                                []  Yes [x]  No Does patient or caregiver demonstrate ability/potential ability & willingness to safely use the mobility device?   [x]  Yes []  No Does patient's home environment support use of recommended mobility device?                                                    [x]  Yes []  No Does patient have sufficient upper extremity function necessary to functionally  propel a manual wheelchair?    [x]  Yes []  No Does patient have sufficient strength and trunk stability to safely operate a POV (scooter)?                                  []  Yes [x]  No Does patient need additional features/benefits provided by a power wheelchair for MRADL's in the home?       []  Yes [x]  No Does the patient demonstrate the ability to safely use a power wheelchair?                                                              []  Yes [x]  No  Therapist Name Printed: Jamey Reas, PT, DPT Date: 11/07/2015  Therapist's Signature:   Date:   Supplier's Name Printed: [employer] Date: 11/07/2015  Supplier's Signature:   Date:  Patient/Caregiver Signature:   Date:     This is to certify that I have read this evaluation and do agree with the content within:    Physician's Name Printed: Danella Sensing, NP  Physician's Signature:  Date:     This is to certify that I, the above signed therapist have the following affiliations: []  This DME provider []  Manufacturer of recommended equipment []  Patient's long term care facility [x]  None of the above                                       Plan - 11/07/15 1732    Clinical Impression Statement Patient needs a custom Group 5 manual wheelchair as recommended below.    Rehab Potential Good   PT Frequency One time visit   PT Next Visit Plan w/c evaluation only   Consulted and Agree with Plan of Care Patient;Family member/caregiver   Family Member Consulted caregiver, Erlene Quan      Patient will benefit from skilled therapeutic intervention in order to improve the following deficits and impairments:  Decreased activity tolerance  Visit Diagnosis: Muscle weakness (  generalized)     Problem List Patient Active Problem List   Diagnosis Date Noted  . Spinal stenosis of lumbar region 05/15/2015  . Blunt trauma of left lower leg 05/15/2015  . Dizziness 04/12/2015  . Stroke ([place]) 03/28/2015  .  Osteoporosis 11/16/2014  . Post herpetic neuralgia 04/04/2014  . Polyarthralgia 10/30/2013  . Hyperkalemia 01/12/2013  . Lumbar spondylosis 03/07/2012  . Osteoarthritis of left knee 02/24/2012  . Phantom limb pain ([place]) 02/24/2012  . Pneumonia, aspiration ([place]) 12/01/2011  . Left leg cellulitis 06/19/2011  . Rash 05/29/2011  . Gait disorder 02/26/2011  . Diastolic dysfunction 61/84/8592  . Seizure ([place]) 10/26/2010  . Allergic rhinitis 10/24/2010  . Hyperlipidemia 09/24/2010  . Preventative health care 09/14/2010  . Chronic pain syndrome 02/14/2010  . Trigeminal neuralgia 02/14/2010  . HYPERSOMNIA 02/14/2010  . MENOPAUSAL DISORDER 12/03/2009  . FATIGUE 12/03/2009  . CKD (chronic kidney disease) 10/31/2009  . HEARING LOSS, RIGHT EAR 06/04/2009  . IBS 06/04/2009  . SKIN ULCER, CHRONIC 02/08/2009  . Congestive heart failure ([place]) 01/28/2009  . ARTHRITIS 01/28/2009  . TONSILLECTOMY, HX OF 01/28/2009  . Status post below knee amputation ([place]) 01/28/2009  . PERIPHERAL EDEMA 08/30/2008  . Diabetes ([place]) 07/17/2008  . B12 DEFICIENCY 07/17/2008  . ANEMIA-NOS 07/17/2008  . ANXIETY 07/17/2008  . Hereditary and idiopathic peripheral neuropathy 07/17/2008  . Essential hypertension 07/17/2008  . GERD 07/17/2008  . Rheumatoid arthritis ([place]) 07/17/2008  . COLONIC POLYPS, HX OF 07/17/2008    Jamey Reas PT, DPT 11/07/2015, 5:36 PM  [facility] [address] [place], [place], [address] Phone: [phone]   Fax:  [phone]  Name: Crystal Fitzpatrick MRN: [id number] Date of Birth: [birth year]

## 2015-11-14 NOTE — Progress Notes (Signed)
Urine drug screen for this encounter is consistent for prescribed medication 

## 2015-11-20 DIAGNOSIS — L89312 Pressure ulcer of right buttock, stage 2: Secondary | ICD-10-CM | POA: Diagnosis not present

## 2015-11-20 DIAGNOSIS — L98499 Non-pressure chronic ulcer of skin of other sites with unspecified severity: Secondary | ICD-10-CM | POA: Diagnosis not present

## 2015-11-20 DIAGNOSIS — L97222 Non-pressure chronic ulcer of left calf with fat layer exposed: Secondary | ICD-10-CM | POA: Diagnosis not present

## 2015-11-20 DIAGNOSIS — S8012XD Contusion of left lower leg, subsequent encounter: Secondary | ICD-10-CM | POA: Diagnosis not present

## 2015-11-20 DIAGNOSIS — E11622 Type 2 diabetes mellitus with other skin ulcer: Secondary | ICD-10-CM | POA: Diagnosis not present

## 2015-11-26 ENCOUNTER — Encounter: Payer: Self-pay | Admitting: Registered Nurse

## 2015-11-26 ENCOUNTER — Encounter: Payer: Medicare Other | Attending: Physical Medicine and Rehabilitation | Admitting: Registered Nurse

## 2015-11-26 VITALS — BP 133/77 | HR 74 | Resp 16

## 2015-11-26 DIAGNOSIS — R309 Painful micturition, unspecified: Secondary | ICD-10-CM | POA: Diagnosis not present

## 2015-11-26 DIAGNOSIS — Z5181 Encounter for therapeutic drug level monitoring: Secondary | ICD-10-CM

## 2015-11-26 DIAGNOSIS — M069 Rheumatoid arthritis, unspecified: Secondary | ICD-10-CM | POA: Insufficient documentation

## 2015-11-26 DIAGNOSIS — M4716 Other spondylosis with myelopathy, lumbar region: Secondary | ICD-10-CM

## 2015-11-26 DIAGNOSIS — G546 Phantom limb syndrome with pain: Secondary | ICD-10-CM | POA: Diagnosis not present

## 2015-11-26 DIAGNOSIS — Z79899 Other long term (current) drug therapy: Secondary | ICD-10-CM

## 2015-11-26 DIAGNOSIS — M179 Osteoarthritis of knee, unspecified: Secondary | ICD-10-CM | POA: Insufficient documentation

## 2015-11-26 DIAGNOSIS — G609 Hereditary and idiopathic neuropathy, unspecified: Secondary | ICD-10-CM | POA: Insufficient documentation

## 2015-11-26 DIAGNOSIS — Z89519 Acquired absence of unspecified leg below knee: Secondary | ICD-10-CM | POA: Diagnosis not present

## 2015-11-26 DIAGNOSIS — G894 Chronic pain syndrome: Secondary | ICD-10-CM

## 2015-11-26 MED ORDER — GABAPENTIN 600 MG PO TABS: 600.0000 mg | ORAL_TABLET | Freq: Three times a day (TID) | ORAL | Status: DC

## 2015-11-26 MED ORDER — FENTANYL 75 MCG/HR TD PT72: 75.0000 ug | MEDICATED_PATCH | TRANSDERMAL | Status: DC

## 2015-11-26 MED ORDER — OXYCODONE-ACETAMINOPHEN 10-325 MG PO TABS: 1.0000 | ORAL_TABLET | Freq: Four times a day (QID) | ORAL | Status: DC | PRN

## 2015-11-26 NOTE — Progress Notes (Signed)
Subjective:    Patient ID: Crystal Fitzpatrick, female    DOB: 03/20/1946, 70 y.o.   MRN: [id number]  HPI: [name] is a 70 year old female who is being seen for a face to face wheelchair evaluation. I have reviewed the Physical Therapist wheelchair evaluation form and concur with their assessment and evaluation. [name] will need the  Ultra light weight manual wheelchair. The ultra light rather than the standard wheelchair for easier self propelling, less strain on her shoulder's. At this time [name] has a skin breakdown on her buttock and would benefit having a Ascent cushion to prevent further breakdown.  [name] is wheelchair bound. The Ultra light wheelchair will be a great asset to [name], it will help her with mobility in her home, also by improving her independence with activities of daily living such as bathing and dressing.   [name] states she has been experiencing  burning with urination for the last few day's. She is waiting on appointment with PCP. Urine culture will be obtained she verbalizes understanding.  Pain Inventory Average Pain 7 Pain Right Now NA My pain is intermittent, sharp, burning, stabbing and tingling  In the last 24 hours, has pain interfered with the following? General activity 6 Relation with others 5 Enjoyment of life 5 What TIME of day is your pain at its worst? morning, night Sleep (in general) Fair  Pain is worse with: NA Pain improves with: rest, therapy/exercise and medication Relief from Meds: NA  Mobility use a wheelchair  Function Do you have any goals in this area?  no  Neuro/Psych No problems in this area  Prior Studies Any changes since last visit?  no  Physicians involved in your care Any changes since last visit?  no   Family History  Problem Relation Age of Onset  . Breast cancer Sister   . Diabetes Other   . Stroke Other     Grandparents   Social History   Social History  . Marital Status: Divorced      Spouse Name: N/A  . Number of Children: 1  . Years of Education: N/A   Occupational History  . retired Marketing executive    Social History Main Topics  . Smoking status: Former Games developer  . Smokeless tobacco: Never Used  . Alcohol Use: No  . Drug Use: No  . Sexual Activity: Not Asked   Other Topics Concern  . None   Social History Narrative   Past Surgical History  Procedure Laterality Date  . Amputation  02/02/2008    below right knee  . S/p egd  2007    with Botox for? Achalasia  . Tonsillectomy    . S/p breast biopsy  1992  . 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 Tubal ligation    . Nasal septum surgery    . Foot surgury  1980 and 1981   Past Medical History  Diagnosis Date  . DIABETES MELLITUS, TYPE II 07/17/2008  . B12 DEFICIENCY 07/17/2008  . Acute gouty arthropathy 12/11/2008  . HYPONATREMIA 03/20/2010  . HYPERKALEMIA 10/31/2009  . ANEMIA-NOS 07/17/2008  . ANXIETY 07/17/2008  . Chronic pain syndrome 02/14/2010  . Trigeminal neuralgia 02/14/2010  . HEARING LOSS, RIGHT EAR 06/04/2009  . HYPERTENSION 07/17/2008  . CHF 01/28/2009  . Pneumonia, organism unspecified 02/14/2010  . GERD 07/17/2008  . IBS 06/04/2009  . UNSPECIFIED HEPATITIS 01/28/2009  . RENAL INSUFFICIENCY 10/31/2009  .  MENOPAUSAL DISORDER 12/03/2009  . Cellulitis and abscess of leg, except foot 11/15/2008  . SKIN ULCER, CHRONIC 02/08/2009  . Rheumatoid arthritis(714.0) 07/17/2008  . SKIN LESION 06/04/2009  . ARTHRITIS 01/28/2009  . FOOT PAIN 07/17/2008  . HYPERSOMNIA 02/14/2010  . Altered mental status 02/07/2010  . PERIPHERAL EDEMA 08/30/2008  . Wheezing 12/03/2009  . ABDOMINAL DISTENSION 10/11/2008  . Dysuria 11/11/2009  . Hypoxemia 03/20/2010  . ANAPHYLACTIC SHOCK 05/15/2009  . NEUROPATHY, HX OF 01/28/2009  . COLONIC POLYPS, HX OF 07/17/2008  . AMPUTATION, BELOW KNEE, RIGHT, HX OF 01/28/2009  . Hyperlipemia 09/24/2010  . Hyperlipidemia 09/24/2010  . Seizure ([facility]) 10/26/2010  . Pneumonia, aspiration  ([facility]) 12/01/2011    Episode July 2013, tx per [facility]  . Osteoporosis 11/16/2014  . Spinal stenosis of lumbar region 05/15/2015   BP 133/77 mmHg  Pulse 74  Resp 16  SpO2 94%  Opioid Risk Score:   Fall Risk Score:  `1  Depression screen PHQ 2/9  Depression screen [facility] 2/9 09/17/2015 02/25/2015 02/12/2015 01/15/2015 12/19/2014 11/20/2014 10/26/2014  Decreased Interest 0 1 0 0 0 0 0  Down, Depressed, Hopeless 0 1 0 0 0 0 0  PHQ - 2 Score 0 2 0 0 0 0 0  Altered sleeping - 3 - - - - -  Tired, decreased energy - 3 - - - - -  Change in appetite - - - - - - -  Feeling bad or failure about yourself  - - - - - - -  Trouble concentrating - 0 - - - - -  Moving slowly or fidgety/restless - 0 - - - - -  Suicidal thoughts - 0 - - - - -  PHQ-9 Score - 8 - - - - -  Difficult doing work/chores - Not difficult at all - - - - -     Review of Systems  Gastrointestinal: Positive for nausea.  Genitourinary: Positive for dysuria.  All other systems reviewed and are negative.      Objective:   Physical Exam  Constitutional: She is oriented to person, place, and time. She appears well-developed and well-nourished.  HENT:  Head: Normocephalic and atraumatic.  Neck: Normal range of motion. Neck supple.  Cardiovascular: Normal rate and regular rhythm.   Pulmonary/Chest: Effort normal and breath sounds normal.  Musculoskeletal:  Normal Muscle Bulk and Muscle Testing Reveals: Upper Extremities: Full ROM and Muscle Strength 5/5 Right AC Joint Tenderness Lumbar Paraspinal Tenderness: L-3- L-5 Lower Extremities: Right: BKA Left: Decreased ROM and Muscle Strength 4/5  Left Lower Extremity Dressing Intact Arrived in wheelchair   Neurological: She is alert and oriented to person, place, and time.  Skin: Skin is warm and dry.  Psychiatric: She has a normal mood and affect.  Nursing note and vitals reviewed.         Assessment & Plan:  1.Right below-knee amputation, history of  phantom limb pain.: Continue Current Medication Regime and Continue to Monitor.  2. Peripheral neuropathy: Continue Gabapentin.  Refilled: Fenatnyl Patch 75 mcg one patch every three days #10. And Continue Oxycodone 10/325mg  one tablet every 6 hours as needed #120. Oxycodone prescription post dated. We will continue the opioid monitoring program, this consists of regular clinic visits, examinations, urine drug screen, pill counts as well as use of [place] Controlled Substance Reporting System. 3. History of rheumatoid arthritis. Continue Current Medication and Exercise and heat Therapy.  4. Osteoarthritis of left knee: Continue with Voltaren gel/ Exercise  and heat therapy.  5. Chronic cellulitis, left leg with associated edema:PCP Following/ Attending Wound Care twice a week. 6. Lumbar Spondylosis: Continue current medication regime. Continue HEP. 7. Painful Urination: RX: Urine Culture  20 minutes of face to face patient care time was spent during this visit. All questions were encouraged and answered.  F/U in 1 month

## 2015-11-27 LAB — URINALYSIS, ROUTINE W REFLEX MICROSCOPIC
BILIRUBIN UA: NEGATIVE
GLUCOSE, UA: NEGATIVE
KETONES UA: NEGATIVE
NITRITE UA: POSITIVE — AB
SPEC GRAV UA: 1.022 (ref 1.005–1.030)
UUROB: 0.2 mg/dL (ref 0.2–1.0)
pH, UA: 5.5 (ref 5.0–7.5)

## 2015-11-27 LAB — MICROSCOPIC EXAMINATION: Casts: NONE SEEN /lpf

## 2015-12-03 ENCOUNTER — Telehealth: Payer: Self-pay | Admitting: Registered Nurse

## 2015-12-03 NOTE — Telephone Encounter (Signed)
Placed a call to Crystal Fitzpatrick regarding her urine culture, no answer awaiting a return call.

## 2015-12-04 DIAGNOSIS — L97222 Non-pressure chronic ulcer of left calf with fat layer exposed: Secondary | ICD-10-CM | POA: Diagnosis not present

## 2015-12-05 ENCOUNTER — Telehealth: Payer: Self-pay | Admitting: Physical Medicine & Rehabilitation

## 2015-12-05 NOTE — Telephone Encounter (Signed)
Patient is returning Eunice's call.

## 2015-12-05 NOTE — Telephone Encounter (Signed)
Return [name] call, no answer. Left Message

## 2015-12-09 ENCOUNTER — Ambulatory Visit: Payer: Medicare Other

## 2015-12-09 NOTE — Progress Notes (Deleted)
Subjective:   Crystal Fitzpatrick is a 70 y.o. female who presents for Medicare Annual (Subsequent) preventive examination.    HRA assessment completed during this visit with   The Patient was informed that the wellness visit is to identify future health risk and educate and initiate measures that can reduce risk for increased disease through the lifespan.    NO ROS; Medicare Wellness Visit Last OV:  09/2015 Dr. Jenny Reichmann Labs completed: 03/2015 Lipids 03/2015 chol 198; Trig 168; HDL 40:LDL 123;  GRR 45  Lifestyle review and risk: (hx of breast cancer; DM; stroke)  Retired emergency response coordinator RA Hyperlipidemia Hearing loss on the right;  chf BKA; right; 01/2008  Psychosocial:  Tobacco: former smoker  How many drinks do you have per week?  none   Medications  BMI:   Diet;  Heart healthy? Fried foods? Sodium: Cholesterol Nutritional counseling given:  Teeth or Denture issues?   Exercise;  active vs sedentary What is the hardest physical activity you have done recently and for how long?   HOME SAFETY;  Fall hx;  Fall risk: Fear of falling?  Gait:  Given education on "Fall Prevention in the Home" for more safety tips the patient can apply as appropriate.  Long term goal is to "age in place" or undecided   Safety features reviewed for safe community; firearms if in the home; smoke alarms; sun protection when outside; driving difficulties or accidents  Mental Health:  Any emotional problems? Anxious, depressed, irritable, sad or blue?  Denies feeling depressed or hopeless; voices pleasure in daily life How many social activities have you been engaged in within the last 2 weeks? Who would help you with chores; illness; shopping other?  Pain:   Cognitive;  Manages checkbook, medications; no failures of task Ad8 score reviewed for issues;  Issues making decisions; no  Less interest in hobbies / activities" no  Repeats questions, stories; family complaining:  NO  Trouble using ordinary gadgets; microwave; computer: no  Forgets the month or year: no  Mismanaging finances: no  Missing apt: no but does write them down  Daily problems with thinking of memory NO Ad8 score is 0   Any dizziness when standing up?   Mobilization and Functional losses from last year to this year?   Sleep pattern changes;   Urinary or fecal incontinence reviewed: no  Advanced Directive addressed; Completed or educated   Counseling Health Maintenance Gaps: Hep C Foot exam A1c completed 03/2015/ 5.6 / glucose all normal  Will do annual check of A1c   Colonoscopy; 05/1999 EKG: 11/2011 Mammogram: 10/2009 (hx of breast cancer in family )  Dexa/ 10/2014: -2.6  PAP: educated regarding the need for GYN exam;   Hearing:  Right ear Left ear Difficulty hearing a whisper Tinnitus; American Tinnitus Association;   Ophthalmology exam: DUE  Glaucoma screening Diabetic retinopathy if appropriate   Immunizations Due: (Vaccines reviewed and educated regarding any overdue)     Health Recommendations and Referrals Risk for CV disease reviewed; including BP; Cholesterol; BS if appropriate; diet and exercise;  Diabetes Screening Prostate Screening Nutritional Counseling;  Smoking Cessation if applicable  Osteopenia;  Obesity  Barriers to Success    Current Care Team reviewed and updated   Education provided and lifestyle risk discussed   All Health Maintenance Gaps Reviewed for closure         Objective:     Vitals: There were no vitals taken for this visit.  There is no height or weight  on file to calculate BMI.   Tobacco History  Smoking Status  . Former Smoker  Smokeless Tobacco  . Never Used     Counseling given: Not Answered   Past Medical History:  Diagnosis Date  . ABDOMINAL DISTENSION 10/11/2008  . Acute gouty arthropathy 12/11/2008  . Altered mental status 02/07/2010  . AMPUTATION, BELOW KNEE, RIGHT, HX OF 01/28/2009  .  ANAPHYLACTIC SHOCK 05/15/2009  . ANEMIA-NOS 07/17/2008  . ANXIETY 07/17/2008  . ARTHRITIS 01/28/2009  . B12 DEFICIENCY 07/17/2008  . Cellulitis and abscess of leg, except foot 11/15/2008  . CHF 01/28/2009  . Chronic pain syndrome 02/14/2010  . COLONIC POLYPS, HX OF 07/17/2008  . DIABETES MELLITUS, TYPE II 07/17/2008  . Dysuria 11/11/2009  . FOOT PAIN 07/17/2008  . GERD 07/17/2008  . HEARING LOSS, RIGHT EAR 06/04/2009  . HYPERKALEMIA 10/31/2009  . Hyperlipemia 09/24/2010  . Hyperlipidemia 09/24/2010  . HYPERSOMNIA 02/14/2010  . HYPERTENSION 07/17/2008  . HYPONATREMIA 03/20/2010  . Hypoxemia 03/20/2010  . IBS 06/04/2009  . MENOPAUSAL DISORDER 12/03/2009  . NEUROPATHY, HX OF 01/28/2009  . Osteoporosis 11/16/2014  . PERIPHERAL EDEMA 08/30/2008  . Pneumonia, aspiration ([place]) 12/01/2011   Episode July 2013, tx per [place] [facility]  . Pneumonia, organism unspecified 02/14/2010  . RENAL INSUFFICIENCY 10/31/2009  . Rheumatoid arthritis(714.0) 07/17/2008  . Seizure ([place]) 10/26/2010  . SKIN LESION 06/04/2009  . SKIN ULCER, CHRONIC 02/08/2009  . Spinal stenosis of lumbar region 05/15/2015  . Trigeminal neuralgia 02/14/2010  . UNSPECIFIED HEPATITIS 01/28/2009  . Wheezing 12/03/2009   Past Surgical History:  Procedure Laterality Date  . AMPUTATION  02/02/2008   below right knee  . foot surgury  1980 and 1981  . NASAL SEPTUM SURGERY    . 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 s/p Breast biopsy  1992  . s/p EGD  2007   with Botox for? Achalasia  . TONSILLECTOMY    . TUBAL LIGATION     Family History  Problem Relation Age of Onset  . Breast cancer Sister   . Diabetes Other   . Stroke Other     Grandparents   History  Sexual Activity  . Sexual activity: Not on file    Outpatient Encounter Prescriptions as of 12/09/2015  Medication Sig  . albuterol (PROAIR HFA) 108 (90 BASE) MCG/ACT inhaler Inhale 2 puffs into the lungs every 6 (six) hours as needed.    . Ascorbic Acid (VITAMIN C) 500 MG tablet Take  500 mg by mouth daily.    . Blood Glucose Monitoring Suppl (ONE TOUCH ULTRA 2) W/DEVICE KIT 1 Device by Does not apply route once.  . collagenase (SANTYL) ointment Apply topically.  . cyclobenzaprine (FLEXERIL) 10 MG tablet TAKE 1 TABLET BY MOUTH EVERY 8 HOURS AS NEEDED  . diclofenac sodium (VOLTAREN) 1 % GEL Apply 2 g topically 4 (four) times daily.  . diphenoxylate-atropine (LOMOTIL) 2.5-0.025 MG tablet TAKE ONE TABLET TWICE DAILY AS NEEDED  . DULoxetine (CYMBALTA) 60 MG capsule TAKE 2 CAPSULES BY MOUTH DAILY  . EPINEPHrine 0.3 mg/0.3 mL IJ SOAJ injection Inject 0.3 mLs (0.3 mg total) into the muscle once.  . fentaNYL (DURAGESIC - DOSED MCG/HR) 75 MCG/HR Place 1 patch (75 mcg total) onto the skin every 3 (three) days. 1 patch every 3 days  . furosemide (LASIX) 40 MG tablet TAKE ONE TABLET EACH DAY as needed for swelling  . gabapentin (NEURONTIN) 600 MG tablet Take 1 tablet (600 mg  total) by mouth 3 (three) times daily.  . meclizine (ANTIVERT) 25 MG tablet Take 1 tablet (25 mg total) by mouth 3 (three) times daily as needed.  . Multiple Vitamins-Minerals (CENTRUM SILVER ULTRA WOMENS PO) Take by mouth daily.    [name] olmesartan (BENICAR) 20 MG tablet Take 1 tablet (20 mg total) by mouth daily.  [name] omega-3 acid ethyl esters (LOVAZA) 1 G capsule Take 1 capsule (1 g total) by mouth daily.  [name] omeprazole (PRILOSEC) 20 MG capsule Take 20 mg by mouth daily.  Glory Rosebush DELICA LANCETS MISC Use as directed to test blood sugar dx 250.00  . oxyCODONE-acetaminophen (PERCOCET) 10-325 MG tablet Take 1 tablet by mouth every 6 (six) hours as needed for pain.  . Probiotic Product (PROBIOTIC PO) Take by mouth daily.  . promethazine (PHENERGAN) 25 MG tablet TAKE 1 TABLET BY MOUTH EVERY 6 HOURS AS NEEDED FOR NAUSEA  . Psyllium (METAMUCIL) 30.9 % POWD Take by mouth. Take as directed   . solifenacin (VESICARE) 5 MG tablet Take 1 tablet (5 mg total) by mouth daily.  [name] topiramate (TOPAMAX) 100 MG tablet TAKE 3 TABLETS BY  MOUTH EACH NIGHT AT BEDTIME  . triamcinolone (NASACORT AQ) 55 MCG/ACT AERO nasal inhaler Place 2 sprays into the nose daily.   No facility-administered encounter medications on file as of 12/09/2015.     Activities of Daily Living In your present state of health, do you have any difficulty performing the following activities: 02/25/2015  Hearing? N  Vision? N  Difficulty concentrating or making decisions? N  Walking or climbing stairs? Y  Dressing or bathing? N  Doing errands, shopping? Y  Preparing Food and eating ? N  Using the Toilet? N  In the past six months, have you accidently leaked urine? N  Do you have problems with loss of bowel control? N  Managing your Medications? N  Managing your Finances? N  Housekeeping or managing your Housekeeping? Y  Some recent data might be hidden    Patient Care Team: Biagio Borg, MD as PCP - General    Assessment:    *** Exercise Activities and Dietary recommendations    Goals    None     Fall Risk Fall Risk  11/26/2015 09/17/2015 07/09/2015 06/05/2015 05/02/2015  Falls in the past year? No Yes Yes No No  Number falls in past yr: - 2 or more 1 - -  Injury with Fall? - No No - -  Risk Factor Category  - High Fall Risk - - -  Risk for fall due to : - History of fall(s);Impaired mobility;Medication side effect - - -  Risk for fall due to (comments): - - - - -  Follow up - Falls evaluation completed - - -   Depression Screen PHQ 2/9 Scores 09/17/2015 02/25/2015 02/12/2015 01/15/2015  PHQ - 2 Score 0 2 0 0  PHQ- 9 Score - 8 - -     Cognitive Testing No flowsheet data found.  Immunization History  Administered Date(s) Administered  . H1N1 07/17/2008  . Influenza Split 01/29/2011, 05/23/2011  . Influenza Whole 03/19/2008, 03/12/2009, 02/14/2010  . Influenza,inj,Quad PF,36+ Mos 03/09/2014  . Influenza-Unspecified 03/11/2015  . Pneumococcal Conjugate-13 07/25/2013  . Pneumococcal Polysaccharide-23 01/17/2008, 06/14/2012  . Td  07/17/2008  . Zoster 07/17/2008   Screening Tests Health Maintenance  Topic Date Due  . Hepatitis C Screening  [birth year]  . OPHTHALMOLOGY EXAM  01/19/1956  . FOOT EXAM  12/12/2013  . HEMOGLOBIN A1C  09/25/2015  . INFLUENZA VACCINE  12/17/2015  . MAMMOGRAM  11/06/2016  . TETANUS/TDAP  07/18/2018  . COLONOSCOPY  05/25/2019  . DEXA SCAN  Completed  . ZOSTAVAX  Completed  . PNA vac Low Risk Adult  Completed      Plan:   *** During the course of the visit the patient was educated and counseled about the following appropriate screening and preventive services:   Vaccines to include Pneumoccal, Influenza, Hepatitis B, Td, Zostavax, HCV  Electrocardiogram  Cardiovascular Disease  Colorectal cancer screening  Bone density screening  Diabetes screening  Glaucoma screening  Mammography/PAP  Nutrition counseling   Patient Instructions (the written plan) was given to the patient.   Wynetta Fines, RN  12/09/2015

## 2015-12-11 DIAGNOSIS — L98499 Non-pressure chronic ulcer of skin of other sites with unspecified severity: Secondary | ICD-10-CM | POA: Diagnosis not present

## 2015-12-11 DIAGNOSIS — L89312 Pressure ulcer of right buttock, stage 2: Secondary | ICD-10-CM | POA: Diagnosis not present

## 2015-12-11 DIAGNOSIS — L97222 Non-pressure chronic ulcer of left calf with fat layer exposed: Secondary | ICD-10-CM | POA: Diagnosis not present

## 2015-12-11 DIAGNOSIS — E11622 Type 2 diabetes mellitus with other skin ulcer: Secondary | ICD-10-CM | POA: Diagnosis not present

## 2015-12-18 DIAGNOSIS — L97222 Non-pressure chronic ulcer of left calf with fat layer exposed: Secondary | ICD-10-CM | POA: Diagnosis not present

## 2015-12-20 ENCOUNTER — Ambulatory Visit (INDEPENDENT_AMBULATORY_CARE_PROVIDER_SITE_OTHER): Payer: Medicare Other | Admitting: Family Medicine

## 2015-12-20 ENCOUNTER — Telehealth: Payer: Self-pay | Admitting: Internal Medicine

## 2015-12-20 ENCOUNTER — Encounter: Payer: Self-pay | Admitting: Family Medicine

## 2015-12-20 VITALS — BP 155/99 | HR 65 | Temp 98.3°F

## 2015-12-20 DIAGNOSIS — N390 Urinary tract infection, site not specified: Secondary | ICD-10-CM | POA: Diagnosis not present

## 2015-12-20 LAB — POC URINALSYSI DIPSTICK (AUTOMATED)
BILIRUBIN UA: NEGATIVE
GLUCOSE UA: NEGATIVE
Ketones, UA: NEGATIVE
LEUKOCYTES UA: NEGATIVE
NITRITE UA: NEGATIVE
PH UA: 6
Spec Grav, UA: >=1.03
UROBILINOGEN UA: 0.2

## 2015-12-20 MED ORDER — AMOXICILLIN-POT CLAVULANATE 875-125 MG PO TABS
1.0000 | ORAL_TABLET | Freq: Two times a day (BID) | ORAL | 0 refills | Status: DC
Start: 2015-12-20 — End: 2016-01-27

## 2015-12-20 NOTE — Telephone Encounter (Signed)
[facility] - Client TELEPHONE ADVICE RECORD [facility]  Patient Name: Crystal Fitzpatrick  DOB: [birth year]    Initial Comment Caller states the office at [facility] was unable to get into the system to make her the schedule. She was told to call back and get the appt set up    Nurse Assessment  Nurse: Dorthula Rue, RN, Enrique Sack Date/Time Lamount Cohen Time): 12/20/2015 12:06:14 PM  Confirm and document reason for call. If symptomatic, describe symptoms. You must click the next button to save text entered. ---Caller states they office could not work her in so she was calling back to speak with me since I had triaged her earlier and knew her situation. Scheduled her today at 315p with Dr. Clent Ridges as the 330 slot had been taken. She has since found transportation and will be to office at that time.  Has the patient traveled out of the country within the last 30 days? ---Not Applicable  Does the patient have any new or worsening symptoms? ---No     Guidelines    Guideline Title Affirmed Question Affirmed Notes       Final Disposition User   Clinical Call Dorthula Rue, RN, Enrique Sack    Comments  Spoke with caller and she states she wants to talk to the nurse she talked to earlier. Spoke with Bernell List, RN and gave her update and transferred chart to her at her request

## 2015-12-20 NOTE — Progress Notes (Signed)
Pre visit review using our clinic review tool, if applicable. No additional management support is needed unless otherwise documented below in the visit note. Pt unable to weigh 

## 2015-12-20 NOTE — Telephone Encounter (Signed)
Pt has appt scheduled for 12/20/2015

## 2015-12-20 NOTE — Telephone Encounter (Signed)
Please try to arrange for pt to be seen at Sat clinic  O/w may need to consider UC

## 2015-12-20 NOTE — Progress Notes (Signed)
   Subjective:    Patient ID: Crystal Fitzpatrick, female    DOB: [birth year], 70 y.o.   MRN: [id number]  HPI Here for 2 days of urgency to urinate with burning. No back pain or nausea or fever. She is drinking lots of water. She had a few Augmentin tablets at home and she has taken 2 of these.    Review of Systems  Constitutional: Negative.   Respiratory: Negative.   Cardiovascular: Negative.   Gastrointestinal: Negative.   Genitourinary: Positive for dysuria, frequency and urgency. Negative for flank pain and hematuria.       Objective:   Physical Exam  Constitutional: She appears well-developed and well-nourished. No distress.  Cardiovascular: Normal rate, regular rhythm, normal heart sounds and intact distal pulses.   Pulmonary/Chest: Effort normal and breath sounds normal.  Abdominal: Soft. Bowel sounds are normal. She exhibits no distension and no mass. There is no tenderness. There is no rebound and no guarding.          Assessment & Plan:  UTI, treat with a full course of Augmentin. Culture the sample.  Nelwyn Salisbury, MD

## 2015-12-20 NOTE — Telephone Encounter (Signed)
[facility] - Client TELEPHONE ADVICE RECORD [facility]  Patient Name: Crystal Fitzpatrick  DOB: 1945-07-16    Initial Comment Caller says has a UTI again, she had one 5 weeks ago also. She wants to get on antibiotics, but she is having trouble getting to the office to give a sample. Sx are very bad when urinating, frequency, No blood in urine this time.    Nurse Assessment  Nurse: Dorthula Rue, RN, Enrique Sack Date/Time (Eastern Time): 12/20/2015 11:16:51 AM  Confirm and document reason for call. If symptomatic, describe symptoms. You must click the next button to save text entered. ---She states she had issues with this 5 weeks. She states she is having sharp pains when urinating and last night was really bad. Fever was 99 last night. Denies any blood in the urine  Has the patient traveled out of the country within the last 30 days? ---Not Applicable  Does the patient have any new or worsening symptoms? ---Yes  Will a triage be completed? ---Yes  Related visit to physician within the last 2 weeks? ---No  Does the PT have any chronic conditions? (i.e. diabetes, asthma, etc.) ---Yes  List chronic conditions. ---Nerve Issues(Stenosis), Amputee  Is this a behavioral health or substance abuse call? ---No     Guidelines    Guideline Title Affirmed Question Affirmed Notes  Urination Pain - Female Age > 50 years    Final Disposition User   See Physician within 24 Hours [place], RN, Enrique Sack    Comments  Caller is going to see about transportation and then call back her office and see if they can put he in the 330 slot with Dr Clent Ridges at [facility]. She may also call back and see if the Saturday times have opened up later today. 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

## 2015-12-22 LAB — URINE CULTURE: Organism ID, Bacteria: NO GROWTH

## 2015-12-23 ENCOUNTER — Other Ambulatory Visit: Payer: Self-pay | Admitting: Physical Medicine & Rehabilitation

## 2015-12-26 ENCOUNTER — Encounter: Payer: Self-pay | Admitting: Registered Nurse

## 2015-12-26 ENCOUNTER — Encounter: Payer: Medicare Other | Attending: Physical Medicine and Rehabilitation | Admitting: Registered Nurse

## 2015-12-26 VITALS — BP 144/83 | HR 74 | Resp 16

## 2015-12-26 DIAGNOSIS — Z5181 Encounter for therapeutic drug level monitoring: Secondary | ICD-10-CM

## 2015-12-26 DIAGNOSIS — G609 Hereditary and idiopathic neuropathy, unspecified: Secondary | ICD-10-CM

## 2015-12-26 DIAGNOSIS — M069 Rheumatoid arthritis, unspecified: Secondary | ICD-10-CM | POA: Diagnosis not present

## 2015-12-26 DIAGNOSIS — Z89519 Acquired absence of unspecified leg below knee: Secondary | ICD-10-CM | POA: Diagnosis not present

## 2015-12-26 DIAGNOSIS — M4716 Other spondylosis with myelopathy, lumbar region: Secondary | ICD-10-CM | POA: Diagnosis not present

## 2015-12-26 DIAGNOSIS — G894 Chronic pain syndrome: Secondary | ICD-10-CM

## 2015-12-26 DIAGNOSIS — M179 Osteoarthritis of knee, unspecified: Secondary | ICD-10-CM | POA: Diagnosis not present

## 2015-12-26 DIAGNOSIS — G546 Phantom limb syndrome with pain: Secondary | ICD-10-CM

## 2015-12-26 DIAGNOSIS — Z79899 Other long term (current) drug therapy: Secondary | ICD-10-CM

## 2015-12-26 MED ORDER — FENTANYL 75 MCG/HR TD PT72: 75.0000 ug | MEDICATED_PATCH | TRANSDERMAL | 0 refills | Status: DC

## 2015-12-26 MED ORDER — OXYCODONE-ACETAMINOPHEN 10-325 MG PO TABS: 1.0000 | ORAL_TABLET | Freq: Four times a day (QID) | ORAL | 0 refills | Status: DC | PRN

## 2015-12-26 NOTE — Progress Notes (Signed)
Subjective:    Patient ID: Crystal Fitzpatrick, female    DOB: 1946/02/08, 70 y.o.   MRN: [id number]  HPI: [name] is a 70 year old female who returns for follow up for chronic pain and medication refill. She states her pain is located in her neck, lower back and bilateral buttocks. [name] has stage 1 ulcer on bilateral gluteal crease, no drainage or odor noted. She rates her pain 9.Her current exercise regime is yoga daily. Also states she's receiving [facility] bi- weekly. Left lower extremity dressing intact.   Pain Inventory Average Pain 7 Pain Right Now 9 My pain is sharp, burning, stabbing and aching  In the last 24 hours, has pain interfered with the following? General activity 7 Relation with others 6 Enjoyment of life 8 What TIME of day is your pain at its worst? morning and night Sleep (in general) Poor  Pain is worse with: sitting Pain improves with: rest and medication Relief from Meds: 6  Mobility use a wheelchair transfers alone  Function Do you have any goals in this area?  no  Neuro/Psych No problems in this area  Prior Studies Any changes since last visit?  no  Physicians involved in your care Any changes since last visit?  no   Family History  Problem Relation Age of Onset  . Breast cancer Sister   . Diabetes Other   . Stroke Other     Grandparents   Social History   Social History  . Marital status: Divorced    Spouse name: N/A  . Number of children: 1  . Years of education: N/A   Occupational History  . retired Marketing executive Retired   Social History Main Topics  . Smoking status: Former Games developer  . Smokeless tobacco: Never Used  . Alcohol use No  . Drug use: No  . Sexual activity: Not Asked   Other Topics Concern  . None   Social History Narrative  . None   Past Surgical History:  Procedure Laterality Date  . AMPUTATION  02/02/2008   below right knee  . foot surgury   1980 and 1981  . NASAL SEPTUM SURGERY    . 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 s/p Breast biopsy  1992  . s/p EGD  2007   with Botox for? Achalasia  . TONSILLECTOMY    . TUBAL LIGATION     Past Medical History:  Diagnosis Date  . ABDOMINAL DISTENSION 10/11/2008  . Acute gouty arthropathy 12/11/2008  . Altered mental status 02/07/2010  . AMPUTATION, BELOW KNEE, RIGHT, HX OF 01/28/2009  . ANAPHYLACTIC SHOCK 05/15/2009  . ANEMIA-NOS 07/17/2008  . ANXIETY 07/17/2008  . ARTHRITIS 01/28/2009  . B12 DEFICIENCY 07/17/2008  . Cellulitis and abscess of leg, except foot 11/15/2008  . CHF 01/28/2009  . Chronic pain syndrome 02/14/2010  . COLONIC POLYPS, HX OF 07/17/2008  . DIABETES MELLITUS, TYPE II 07/17/2008  . Dysuria 11/11/2009  . FOOT PAIN 07/17/2008  . GERD 07/17/2008  . HEARING LOSS, RIGHT EAR 06/04/2009  . HYPERKALEMIA 10/31/2009  . Hyperlipemia 09/24/2010  . Hyperlipidemia 09/24/2010  . HYPERSOMNIA 02/14/2010  . HYPERTENSION 07/17/2008  . HYPONATREMIA 03/20/2010  . Hypoxemia 03/20/2010  . IBS 06/04/2009  . MENOPAUSAL DISORDER 12/03/2009  . NEUROPATHY, HX OF 01/28/2009  . Osteoporosis 11/16/2014  . PERIPHERAL EDEMA 08/30/2008  . Pneumonia, aspiration (HCC) 12/01/2011   Episode July 2013, tx  per [facility]  . Pneumonia, organism unspecified 02/14/2010  . RENAL INSUFFICIENCY 10/31/2009  . Rheumatoid arthritis(714.0) 07/17/2008  . Seizure (HCC) 10/26/2010  . SKIN LESION 06/04/2009  . SKIN ULCER, CHRONIC 02/08/2009  . Spinal stenosis of lumbar region 05/15/2015  . Trigeminal neuralgia 02/14/2010  . UNSPECIFIED HEPATITIS 01/28/2009  . Wheezing 12/03/2009   BP (!) 144/83 (BP Location: Right Arm, Patient Position: Sitting, Cuff Size: Normal)   Pulse 74   Resp 16   SpO2 91%   Opioid Risk Score:   Fall Risk Score:  `1  Depression screen PHQ 2/9  Depression screen [facility] 2/9 12/26/2015 09/17/2015 02/25/2015 02/12/2015 01/15/2015 12/19/2014 11/20/2014  Decreased Interest 0 0 1 0 0 0 0  Down, Depressed,  Hopeless 0 0 1 0 0 0 0  PHQ - 2 Score 0 0 2 0 0 0 0  Altered sleeping - - 3 - - - -  Tired, decreased energy - - 3 - - - -  Change in appetite - - - - - - -  Feeling bad or failure about yourself  - - - - - - -  Trouble concentrating - - 0 - - - -  Moving slowly or fidgety/restless - - 0 - - - -  Suicidal thoughts - - 0 - - - -  PHQ-9 Score - - 8 - - - -  Difficult doing work/chores - - Not difficult at all - - - -  Some recent data might be hidden   Review of Systems  Genitourinary: Positive for dysuria.  All other systems reviewed and are negative.      Objective:   Physical Exam  Constitutional: She is oriented to person, place, and time. She appears well-developed and well-nourished.  HENT:  Head: Normocephalic and atraumatic.  Neck: Normal range of motion. Neck supple.  Cervical Paraspinal Tenderness: C-5- C-6  Cardiovascular: Normal rate and regular rhythm.   Pulmonary/Chest: Effort normal and breath sounds normal.  Musculoskeletal:  Normal Muscle Bulk and Muscle Testing Reveals: Upper Extremities: Full ROM and Muscle Strength 5/5 Thoracic Paraspinal Tenderness: T-1- T-3 Lumbar Hypersensitivity Lower Extremities: Right: BKA Left: Left Lower Extremity dressing intact Arrived in wheelchair  Neurological: She is alert and oriented to person, place, and time.  Skin: Skin is warm and dry.  Bilateral Gluteal Creases with Stage 1 ulcer  Psychiatric: She has a normal mood and affect.  Nursing note and vitals reviewed.         Assessment & Plan:  1.Right below-knee amputation, history of phantom limb pain.: Continue Current Medication Regime and Continue to Monitor.  2. Peripheral neuropathy: Continue Gabapentin.  Refilled: Fenatnyl Patch 75 mcg one patch every three days #10. And Continue Oxycodone 10/325mg  one tablet every 6 hours as needed #120. Oxycodone prescription post dated. We will continue the opioid monitoring program, this consists of regular clinic  visits, examinations, urine drug screen, pill counts as well as use of [place] Controlled Substance Reporting System. 3. History of rheumatoid arthritis. Continue Current Medication and Exercise and heat Therapy.  4. Osteoarthritis of left knee: Continue with Voltaren gel/ Exercise and heat therapy.  5. Chronic cellulitis, left leg with associated edema:PCP Following/ Attending Wound Care twice a week. 6. Lumbar Spondylosis: Continue current medication regime. Continue HEP. 7. Bilateral Gluteal Stage 1 Ulcer: Wound Care Following  20 minutes of face to face patient care time was spent during this visit. All questions were encouraged and answered.  F/U in 1 month

## 2015-12-31 ENCOUNTER — Encounter: Payer: Medicare Other | Admitting: Physical Medicine & Rehabilitation

## 2016-01-01 DIAGNOSIS — L89312 Pressure ulcer of right buttock, stage 2: Secondary | ICD-10-CM | POA: Diagnosis not present

## 2016-01-01 DIAGNOSIS — L98499 Non-pressure chronic ulcer of skin of other sites with unspecified severity: Secondary | ICD-10-CM | POA: Diagnosis not present

## 2016-01-01 DIAGNOSIS — L97222 Non-pressure chronic ulcer of left calf with fat layer exposed: Secondary | ICD-10-CM | POA: Diagnosis not present

## 2016-01-01 DIAGNOSIS — E11622 Type 2 diabetes mellitus with other skin ulcer: Secondary | ICD-10-CM | POA: Diagnosis not present

## 2016-01-13 ENCOUNTER — Other Ambulatory Visit: Payer: Self-pay | Admitting: Physical Medicine & Rehabilitation

## 2016-01-13 ENCOUNTER — Telehealth: Payer: Self-pay | Admitting: Physical Medicine & Rehabilitation

## 2016-01-13 NOTE — Telephone Encounter (Signed)
rx sent electronically, pt notified

## 2016-01-13 NOTE — Telephone Encounter (Signed)
Patient needs a refill on Flexeril, she has been having cramps.

## 2016-01-14 DIAGNOSIS — L97222 Non-pressure chronic ulcer of left calf with fat layer exposed: Secondary | ICD-10-CM | POA: Diagnosis not present

## 2016-01-22 ENCOUNTER — Encounter: Payer: Medicare Other | Admitting: Physical Medicine & Rehabilitation

## 2016-01-22 ENCOUNTER — Other Ambulatory Visit: Payer: Self-pay

## 2016-01-22 MED ORDER — PROMETHAZINE HCL 25 MG PO TABS: 25.0000 mg | ORAL_TABLET | Freq: Four times a day (QID) | ORAL | 1 refills | Status: DC | PRN

## 2016-01-22 NOTE — Telephone Encounter (Signed)
Pt called requesting a refill of Promethazine, please advise

## 2016-01-23 ENCOUNTER — Ambulatory Visit: Payer: Medicare Other | Admitting: Registered Nurse

## 2016-01-23 NOTE — Telephone Encounter (Signed)
Pt advised that Rx refilled to [facility]

## 2016-01-24 ENCOUNTER — Telehealth: Payer: Self-pay | Admitting: *Deleted

## 2016-01-24 NOTE — Telephone Encounter (Signed)
Calling about getting her patches refilled. She is asking if Apolinar Junes can come by to pick up.  Also, fyi She got wheelchair last week.

## 2016-01-27 ENCOUNTER — Encounter: Payer: Self-pay | Admitting: Registered Nurse

## 2016-01-27 ENCOUNTER — Encounter: Payer: Medicare Other | Attending: Physical Medicine and Rehabilitation | Admitting: Registered Nurse

## 2016-01-27 VITALS — BP 114/72 | HR 61 | Resp 16

## 2016-01-27 DIAGNOSIS — G546 Phantom limb syndrome with pain: Secondary | ICD-10-CM

## 2016-01-27 DIAGNOSIS — M4716 Other spondylosis with myelopathy, lumbar region: Secondary | ICD-10-CM | POA: Diagnosis not present

## 2016-01-27 DIAGNOSIS — L97222 Non-pressure chronic ulcer of left calf with fat layer exposed: Secondary | ICD-10-CM | POA: Diagnosis not present

## 2016-01-27 DIAGNOSIS — Z79899 Other long term (current) drug therapy: Secondary | ICD-10-CM

## 2016-01-27 DIAGNOSIS — Z5181 Encounter for therapeutic drug level monitoring: Secondary | ICD-10-CM

## 2016-01-27 DIAGNOSIS — M179 Osteoarthritis of knee, unspecified: Secondary | ICD-10-CM | POA: Diagnosis not present

## 2016-01-27 DIAGNOSIS — G609 Hereditary and idiopathic neuropathy, unspecified: Secondary | ICD-10-CM | POA: Diagnosis not present

## 2016-01-27 DIAGNOSIS — M069 Rheumatoid arthritis, unspecified: Secondary | ICD-10-CM | POA: Insufficient documentation

## 2016-01-27 DIAGNOSIS — G894 Chronic pain syndrome: Secondary | ICD-10-CM

## 2016-01-27 DIAGNOSIS — Z89519 Acquired absence of unspecified leg below knee: Secondary | ICD-10-CM | POA: Diagnosis not present

## 2016-01-27 MED ORDER — OXYCODONE-ACETAMINOPHEN 10-325 MG PO TABS: 1.0000 | ORAL_TABLET | Freq: Four times a day (QID) | ORAL | 0 refills | Status: DC | PRN

## 2016-01-27 MED ORDER — FENTANYL 75 MCG/HR TD PT72: 75.0000 ug | MEDICATED_PATCH | TRANSDERMAL | 0 refills | Status: DC

## 2016-01-27 NOTE — Telephone Encounter (Signed)
Linzey has appt today 01/27/16

## 2016-01-27 NOTE — Progress Notes (Signed)
Subjective:    Patient ID: Crystal Fitzpatrick, female    DOB: 07-13-1945, 70 y.o.   MRN: [id number]  HPI: [name] is a 70 year old female who returns for follow up for chronic pain and medication refill. She states her pain is located in her upper- lower back and bilateral knees. Also have  stage 1 ulcer on bilateral gluteal crease. She rates her pain 5.Her current exercise regime is yoga daily. Also states she's receiving wound care at [facility] in [facility] bi- weekly. Left lower extremity dressing intact.   Pain Inventory Average Pain 6 Pain Right Now 5 My pain is sharp, burning, stabbing and aching  In the last 24 hours, has pain interfered with the following? General activity 3 Relation with others 7 Enjoyment of life 3 What TIME of day is your pain at its worst? morning and night Sleep (in general) NA  Pain is worse with: inactivity Pain improves with: heat/ice and medication Relief from Meds: na  Mobility use a wheelchair  Function I need assistance with the following:  shopping  Neuro/Psych No problems in this area  Prior Studies Any changes since last visit?  no  Physicians involved in your care Any changes since last visit?  no   Family History  Problem Relation Age of Onset  . Breast cancer Sister   . Diabetes Other   . Stroke Other     Grandparents   Social History   Social History  . Marital status: Divorced    Spouse name: N/A  . Number of children: 1  . Years of education: N/A   Occupational History  . retired Marketing executive Retired   Social History Main Topics  . Smoking status: Former Games developer  . Smokeless tobacco: Never Used  . Alcohol use No  . Drug use: No  . Sexual activity: Not Asked   Other Topics Concern  . None   Social History Narrative  . None   Past Surgical History:  Procedure Laterality Date  . AMPUTATION  02/02/2008   below right knee  . foot surgury  1980 and 1981  . NASAL SEPTUM  SURGERY    . 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 s/p Breast biopsy  1992  . s/p EGD  2007   with Botox for? Achalasia  . TONSILLECTOMY    . TUBAL LIGATION     Past Medical History:  Diagnosis Date  . ABDOMINAL DISTENSION 10/11/2008  . Acute gouty arthropathy 12/11/2008  . Altered mental status 02/07/2010  . AMPUTATION, BELOW KNEE, RIGHT, HX OF 01/28/2009  . ANAPHYLACTIC SHOCK 05/15/2009  . ANEMIA-NOS 07/17/2008  . ANXIETY 07/17/2008  . ARTHRITIS 01/28/2009  . B12 DEFICIENCY 07/17/2008  . Cellulitis and abscess of leg, except foot 11/15/2008  . CHF 01/28/2009  . Chronic pain syndrome 02/14/2010  . COLONIC POLYPS, HX OF 07/17/2008  . DIABETES MELLITUS, TYPE II 07/17/2008  . Dysuria 11/11/2009  . FOOT PAIN 07/17/2008  . GERD 07/17/2008  . HEARING LOSS, RIGHT EAR 06/04/2009  . HYPERKALEMIA 10/31/2009  . Hyperlipemia 09/24/2010  . Hyperlipidemia 09/24/2010  . HYPERSOMNIA 02/14/2010  . HYPERTENSION 07/17/2008  . HYPONATREMIA 03/20/2010  . Hypoxemia 03/20/2010  . IBS 06/04/2009  . MENOPAUSAL DISORDER 12/03/2009  . NEUROPATHY, HX OF 01/28/2009  . Osteoporosis 11/16/2014  . PERIPHERAL EDEMA 08/30/2008  . Pneumonia, aspiration (HCC) 12/01/2011   Episode July 2013, tx per [facility]  . Pneumonia,  organism unspecified 02/14/2010  . RENAL INSUFFICIENCY 10/31/2009  . Rheumatoid arthritis(714.0) 07/17/2008  . Seizure (HCC) 10/26/2010  . SKIN LESION 06/04/2009  . SKIN ULCER, CHRONIC 02/08/2009  . Spinal stenosis of lumbar region 05/15/2015  . Trigeminal neuralgia 02/14/2010  . UNSPECIFIED HEPATITIS 01/28/2009  . Wheezing 12/03/2009   BP 114/72 (BP Location: Right Arm, Patient Position: Sitting, Cuff Size: Large)   Pulse 61   Resp 16   SpO2 90%   Opioid Risk Score:   Fall Risk Score:  `1  Depression screen PHQ 2/9  Depression screen [facility] 2/9 01/27/2016 12/26/2015 09/17/2015 02/25/2015 02/12/2015 01/15/2015 12/19/2014  Decreased Interest 0 0 0 1 0 0 0  Down, Depressed, Hopeless 0 0 0 1 0 0 0  PHQ - 2  Score 0 0 0 2 0 0 0  Altered sleeping - - - 3 - - -  Tired, decreased energy - - - 3 - - -  Change in appetite - - - - - - -  Feeling bad or failure about yourself  - - - - - - -  Trouble concentrating - - - 0 - - -  Moving slowly or fidgety/restless - - - 0 - - -  Suicidal thoughts - - - 0 - - -  PHQ-9 Score - - - 8 - - -  Difficult doing work/chores - - - Not difficult at all - - -  Some recent data might be hidden    Review of Systems  All other systems reviewed and are negative.      Objective:   Physical Exam  Constitutional: She is oriented to person, place, and time. She appears well-developed and well-nourished.  HENT:  Head: Normocephalic and atraumatic.  Neck: Normal range of motion. Neck supple.  Cardiovascular: Normal rate and regular rhythm.   Pulmonary/Chest: Effort normal and breath sounds normal.  Musculoskeletal:  Normal Muscle Bulk and Muscle Testing Reveals: Upper Extremities: Full ROM and Muscle Strength 5/5 Lumbar Paraspinal Tenderness: L-3- L-5 Lower Extremities: Right: BKA Left Lower Extremity: Decreased ROM and Muscle Strength 5/5 Left lower extremity dressing intact Arrived in wheelchair  Neurological: She is alert and oriented to person, place, and time.  Skin: Skin is warm and dry.  Psychiatric: She has a normal mood and affect.  Nursing note and vitals reviewed.         Assessment & Plan:  1.Right below-knee amputation, history of phantom limb pain.: Continue Current Medication Regime and Continue to Monitor.  2. Peripheral neuropathy: Continue Gabapentin.  Refilled: Fenatnyl Patch 75 mcg one patch every three days #10. And Continue Oxycodone 10/325mg  one tablet every 6 hours as needed #120. Second prescription given to accommodate scheduled appointment. We will continue the opioid monitoring program, this consists of regular clinic visits, examinations, urine drug screen, pill counts as well as use of [place] Controlled Substance  Reporting System. 3. History of rheumatoid arthritis. Continue Current Medication and Exercise and heat Therapy.  4. Osteoarthritis of left knee: Continue with Voltaren gel/ Exercise and heat therapy.  5. Chronic cellulitis, left leg with associated edema:PCP Following/ Attending Wound Care bi-weekly. 6. Lumbar Spondylosis: Continue current medication regime. Continue HEP. 7. Bilateral Gluteal Stage 1 Ulcer: Wound Care Following  20 minutes of face to face patient care time was spent during this visit. All questions were encouraged and answered.  F/U in 1 month

## 2016-02-17 ENCOUNTER — Other Ambulatory Visit: Payer: Self-pay | Admitting: Internal Medicine

## 2016-02-18 ENCOUNTER — Other Ambulatory Visit: Payer: Self-pay | Admitting: Internal Medicine

## 2016-02-18 DIAGNOSIS — N189 Chronic kidney disease, unspecified: Secondary | ICD-10-CM | POA: Diagnosis not present

## 2016-02-18 DIAGNOSIS — L89312 Pressure ulcer of right buttock, stage 2: Secondary | ICD-10-CM | POA: Diagnosis not present

## 2016-02-18 DIAGNOSIS — I739 Peripheral vascular disease, unspecified: Secondary | ICD-10-CM | POA: Diagnosis not present

## 2016-02-18 DIAGNOSIS — G8929 Other chronic pain: Secondary | ICD-10-CM | POA: Diagnosis not present

## 2016-02-18 DIAGNOSIS — E11622 Type 2 diabetes mellitus with other skin ulcer: Secondary | ICD-10-CM | POA: Diagnosis not present

## 2016-02-18 DIAGNOSIS — E1122 Type 2 diabetes mellitus with diabetic chronic kidney disease: Secondary | ICD-10-CM | POA: Diagnosis not present

## 2016-02-18 DIAGNOSIS — S8012XD Contusion of left lower leg, subsequent encounter: Secondary | ICD-10-CM | POA: Diagnosis not present

## 2016-02-18 DIAGNOSIS — M1990 Unspecified osteoarthritis, unspecified site: Secondary | ICD-10-CM | POA: Diagnosis not present

## 2016-02-18 DIAGNOSIS — Z87891 Personal history of nicotine dependence: Secondary | ICD-10-CM | POA: Diagnosis not present

## 2016-02-18 DIAGNOSIS — Z8673 Personal history of transient ischemic attack (TIA), and cerebral infarction without residual deficits: Secondary | ICD-10-CM | POA: Diagnosis not present

## 2016-02-18 DIAGNOSIS — L97222 Non-pressure chronic ulcer of left calf with fat layer exposed: Secondary | ICD-10-CM | POA: Diagnosis not present

## 2016-02-18 DIAGNOSIS — L89322 Pressure ulcer of left buttock, stage 2: Secondary | ICD-10-CM | POA: Diagnosis not present

## 2016-03-03 DIAGNOSIS — L97222 Non-pressure chronic ulcer of left calf with fat layer exposed: Secondary | ICD-10-CM | POA: Diagnosis not present

## 2016-03-10 DIAGNOSIS — I872 Venous insufficiency (chronic) (peripheral): Secondary | ICD-10-CM | POA: Diagnosis not present

## 2016-03-10 DIAGNOSIS — L97222 Non-pressure chronic ulcer of left calf with fat layer exposed: Secondary | ICD-10-CM | POA: Diagnosis not present

## 2016-03-10 DIAGNOSIS — M1990 Unspecified osteoarthritis, unspecified site: Secondary | ICD-10-CM | POA: Diagnosis not present

## 2016-03-10 DIAGNOSIS — Z87891 Personal history of nicotine dependence: Secondary | ICD-10-CM | POA: Diagnosis not present

## 2016-03-10 DIAGNOSIS — N189 Chronic kidney disease, unspecified: Secondary | ICD-10-CM | POA: Diagnosis not present

## 2016-03-10 DIAGNOSIS — L89322 Pressure ulcer of left buttock, stage 2: Secondary | ICD-10-CM | POA: Diagnosis not present

## 2016-03-10 DIAGNOSIS — G8929 Other chronic pain: Secondary | ICD-10-CM | POA: Diagnosis not present

## 2016-03-10 DIAGNOSIS — E1122 Type 2 diabetes mellitus with diabetic chronic kidney disease: Secondary | ICD-10-CM | POA: Diagnosis not present

## 2016-03-10 DIAGNOSIS — S8012XD Contusion of left lower leg, subsequent encounter: Secondary | ICD-10-CM | POA: Diagnosis not present

## 2016-03-10 DIAGNOSIS — L089 Local infection of the skin and subcutaneous tissue, unspecified: Secondary | ICD-10-CM | POA: Diagnosis not present

## 2016-03-10 DIAGNOSIS — I739 Peripheral vascular disease, unspecified: Secondary | ICD-10-CM | POA: Diagnosis not present

## 2016-03-10 DIAGNOSIS — E11622 Type 2 diabetes mellitus with other skin ulcer: Secondary | ICD-10-CM | POA: Diagnosis not present

## 2016-03-10 DIAGNOSIS — Z8673 Personal history of transient ischemic attack (TIA), and cerebral infarction without residual deficits: Secondary | ICD-10-CM | POA: Diagnosis not present

## 2016-03-10 DIAGNOSIS — L98499 Non-pressure chronic ulcer of skin of other sites with unspecified severity: Secondary | ICD-10-CM | POA: Diagnosis not present

## 2016-03-10 DIAGNOSIS — L89312 Pressure ulcer of right buttock, stage 2: Secondary | ICD-10-CM | POA: Diagnosis not present

## 2016-03-12 DIAGNOSIS — L97222 Non-pressure chronic ulcer of left calf with fat layer exposed: Secondary | ICD-10-CM | POA: Diagnosis not present

## 2016-03-12 DIAGNOSIS — Z4800 Encounter for change or removal of nonsurgical wound dressing: Secondary | ICD-10-CM | POA: Diagnosis not present

## 2016-03-16 ENCOUNTER — Encounter: Payer: Self-pay | Admitting: Physical Medicine & Rehabilitation

## 2016-03-16 ENCOUNTER — Encounter: Payer: Medicare Other | Attending: Physical Medicine and Rehabilitation | Admitting: Physical Medicine & Rehabilitation

## 2016-03-16 VITALS — BP 118/76 | HR 62 | Resp 14

## 2016-03-16 DIAGNOSIS — M4716 Other spondylosis with myelopathy, lumbar region: Secondary | ICD-10-CM | POA: Insufficient documentation

## 2016-03-16 DIAGNOSIS — M47816 Spondylosis without myelopathy or radiculopathy, lumbar region: Secondary | ICD-10-CM

## 2016-03-16 DIAGNOSIS — Z89519 Acquired absence of unspecified leg below knee: Secondary | ICD-10-CM | POA: Diagnosis not present

## 2016-03-16 DIAGNOSIS — G546 Phantom limb syndrome with pain: Secondary | ICD-10-CM

## 2016-03-16 DIAGNOSIS — L98499 Non-pressure chronic ulcer of skin of other sites with unspecified severity: Secondary | ICD-10-CM

## 2016-03-16 DIAGNOSIS — M069 Rheumatoid arthritis, unspecified: Secondary | ICD-10-CM | POA: Diagnosis not present

## 2016-03-16 DIAGNOSIS — G609 Hereditary and idiopathic neuropathy, unspecified: Secondary | ICD-10-CM | POA: Diagnosis not present

## 2016-03-16 DIAGNOSIS — Z89511 Acquired absence of right leg below knee: Secondary | ICD-10-CM

## 2016-03-16 DIAGNOSIS — M179 Osteoarthritis of knee, unspecified: Secondary | ICD-10-CM | POA: Insufficient documentation

## 2016-03-16 MED ORDER — GABAPENTIN 600 MG PO TABS: 600.0000 mg | ORAL_TABLET | Freq: Three times a day (TID) | ORAL | 3 refills | Status: DC

## 2016-03-16 MED ORDER — FENTANYL 75 MCG/HR TD PT72: 75.0000 ug | MEDICATED_PATCH | TRANSDERMAL | 0 refills | Status: DC

## 2016-03-16 MED ORDER — OXYCODONE-ACETAMINOPHEN 10-325 MG PO TABS: 1.0000 | ORAL_TABLET | Freq: Four times a day (QID) | ORAL | 0 refills | Status: DC | PRN

## 2016-03-16 NOTE — Progress Notes (Signed)
Subjective:    Patient ID: Crystal Fitzpatrick, female    DOB: [birth year], 70 y.o.   MRN: [id number]  HPI   Crystal Fitzpatrick is here in follow up of her right BKA and chronic pain. She is very happy with her new w/c. She developed a necrotic bullous in the left leg in January. She was hospitalized for infection but has struggled with chronic wound problems there since then. She has an ongoing dressing and wrap to the area. She is a utilizing an antimicrobial whipe which helps keep her skin clean and prevent recurrent prescription.   She has also noted more problems with shooting pain in her left leg which is tingling and sometimes knife-like in character.    Pain Inventory Average Pain 6 Pain Right Now 4 My pain is sharp, burning, stabbing and aching  In the last 24 hours, has pain interfered with the following? General activity 7 Relation with others 2 Enjoyment of life 2 What TIME of day is your pain at its worst? morning, night Sleep (in general) Fair  Pain is worse with: inactivity and some activites Pain improves with: rest, therapy/exercise and medication Relief from Meds: 7  Mobility use a wheelchair Do you have any goals in this area?  yes  Function Do you have any goals in this area?  no  Neuro/Psych No problems in this area  Prior Studies Any changes since last visit?  no  Physicians involved in your care Any changes since last visit?  no   Family History  Problem Relation Age of Onset  . Breast cancer Sister   . Diabetes Other   . Stroke Other     Grandparents   Social History   Social History  . Marital status: Divorced    Spouse name: N/A  . Number of children: 1  . Years of education: N/A   Occupational History  . retired Marketing executive Retired   Social History Main Topics  . Smoking status: Former Games developer  . Smokeless tobacco: Never Used  . Alcohol use No  . Drug use: No  . Sexual activity: Not Asked   Other Topics Concern  . None     Social History Narrative  . None   Past Surgical History:  Procedure Laterality Date  . AMPUTATION  02/02/2008   below right knee  . foot surgury  1980 and 1981  . NASAL SEPTUM SURGERY    . 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 s/p Breast biopsy  1992  . s/p EGD  2007   with Botox for? Achalasia  . TONSILLECTOMY    . TUBAL LIGATION     Past Medical History:  Diagnosis Date  . ABDOMINAL DISTENSION 10/11/2008  . Acute gouty arthropathy 12/11/2008  . Altered mental status 02/07/2010  . AMPUTATION, BELOW KNEE, RIGHT, HX OF 01/28/2009  . ANAPHYLACTIC SHOCK 05/15/2009  . ANEMIA-NOS 07/17/2008  . ANXIETY 07/17/2008  . ARTHRITIS 01/28/2009  . B12 DEFICIENCY 07/17/2008  . Cellulitis and abscess of leg, except foot 11/15/2008  . CHF 01/28/2009  . Chronic pain syndrome 02/14/2010  . COLONIC POLYPS, HX OF 07/17/2008  . DIABETES MELLITUS, TYPE II 07/17/2008  . Dysuria 11/11/2009  . FOOT PAIN 07/17/2008  . GERD 07/17/2008  . HEARING LOSS, RIGHT EAR 06/04/2009  . HYPERKALEMIA 10/31/2009  . Hyperlipemia 09/24/2010  . Hyperlipidemia 09/24/2010  . HYPERSOMNIA 02/14/2010  . HYPERTENSION 07/17/2008  . HYPONATREMIA 03/20/2010  . Hypoxemia 03/20/2010  . IBS 06/04/2009  .  MENOPAUSAL DISORDER 12/03/2009  . NEUROPATHY, HX OF 01/28/2009  . Osteoporosis 11/16/2014  . PERIPHERAL EDEMA 08/30/2008  . Pneumonia, aspiration (HCC) 12/01/2011   Episode July 2013, tx per [facility]  . Pneumonia, organism unspecified(486) 02/14/2010  . RENAL INSUFFICIENCY 10/31/2009  . Rheumatoid arthritis(714.0) 07/17/2008  . Seizure (HCC) 10/26/2010  . SKIN LESION 06/04/2009  . SKIN ULCER, CHRONIC 02/08/2009  . Spinal stenosis of lumbar region 05/15/2015  . Trigeminal neuralgia 02/14/2010  . UNSPECIFIED HEPATITIS 01/28/2009  . Wheezing 12/03/2009   BP 118/76   Pulse 62   Resp 14   SpO2 95%   Opioid Risk Score:   Fall Risk Score:  `1  Depression screen PHQ 2/9  Depression screen [facility] 2/9 01/27/2016 12/26/2015 09/17/2015  02/25/2015 02/12/2015 01/15/2015 12/19/2014  Decreased Interest 0 0 0 1 0 0 0  Down, Depressed, Hopeless 0 0 0 1 0 0 0  PHQ - 2 Score 0 0 0 2 0 0 0  Altered sleeping - - - 3 - - -  Tired, decreased energy - - - 3 - - -  Change in appetite - - - - - - -  Feeling bad or failure about yourself  - - - - - - -  Trouble concentrating - - - 0 - - -  Moving slowly or fidgety/restless - - - 0 - - -  Suicidal thoughts - - - 0 - - -  PHQ-9 Score - - - 8 - - -  Difficult doing work/chores - - - Not difficult at all - - -  Some recent data might be hidden     Review of Systems  All other systems reviewed and are negative.      Objective:   Physical Exam  Constitutional: She appears well-developed and well-nourished. She has lost weight  HENT: some facial tenderness and hypertensitivity  Head: Normocephalic and atraumatic.  Nose: Nose normal.  Mouth/Throat: Oropharynx is clear and moist.  Eyes: Conjunctivae are normal.  Neck: Normal range of motion. Neck supple.  Cardiovascular: Normal rate and regular rhythm.  Pulmonary/Chest: No respiratory distress. She has no wheezes.  Musculoskeletal: minimal low back tenderness Edema minimal RLE, 1+LLE. Chronic stasis changes are noted on the left leg ---leg is wrapped in unna dressing. Mild swelling left foot. Left hip wound not seen Neurological: A sensory deficit left foot/leg, right leg to PP and LT. Had difficulty with shoulder abduction past 90 degrees, 3/5 strength, biceps,triceps 4/5 on left   Psychiatric: She has a normal mood and affect. Her behavior is normal. Judgment and thought content normal.  W/c is fitting appropriately.        Assessment & Plan:  1.Right below-knee amputation, history of phantom limb pain.: Continue Current Medication Regime as below 2. Peripheral neuropathy: Continue Gabapentin. will give her an extra 15 tabs per month to help with breakthrough neuropathic pain. Refilled: Fentanyl Patch 75 mcg one patch every  three days #10. And Continue Oxycodone 10/325mg  one tablet every 6 hours as needed #120.   - continue the opioid monitoring program, this consists of regular clinic visits, examinations, urine drug screen, pill counts as well as use of [place] Controlled Substance Reporting System. 3. History of rheumatoid arthritis. Continue Current Medication and Exercise and heat Therapy.  4. Osteoarthritis of left knee: Continue with Voltaren gel/ Exercise and heat therapy.  5. Chronic cellulitis, left leg with associated edema:PCP Following/ Attending Wound Care clinic twice a week.  -discussed appropriate nutrition, pressure  relief 6. Lumbar Spondylosis: Continue current medication regime. Continue HEP. 7. Bilateral Gluteal Stage 1 Ulcer: Wound Care Following also---new chair/cushion should help  20 minutes of face to face patient care time was spent during this visit. All questions were encouraged and answered.

## 2016-03-16 NOTE — Patient Instructions (Signed)
PLEASE CALL ME WITH ANY PROBLEMS OR QUESTIONS ([phone])  

## 2016-03-19 ENCOUNTER — Telehealth: Payer: Self-pay | Admitting: *Deleted

## 2016-03-19 DIAGNOSIS — L97222 Non-pressure chronic ulcer of left calf with fat layer exposed: Secondary | ICD-10-CM | POA: Diagnosis not present

## 2016-03-19 NOTE — Telephone Encounter (Signed)
Mekiah brought in a package of her READYBATH LUXE antibacterial bathing cloths (8 per pkg) for [name] to write a prescription for her to get a case of them from [facility] ref [id number] [facility] [phone]. Prescription hand written out for [name] to sign.  A photo will be scanned under [facility] tab for future reference.Call when ready to pick up

## 2016-03-23 ENCOUNTER — Telehealth: Payer: Self-pay | Admitting: Physical Medicine & Rehabilitation

## 2016-03-23 NOTE — Telephone Encounter (Signed)
Patient would like to know if she needs to come pick up a prescription for the cleaning cloths or can it be sent over to [facility].  Please call patient.

## 2016-03-25 ENCOUNTER — Other Ambulatory Visit: Payer: Self-pay | Admitting: Internal Medicine

## 2016-03-25 ENCOUNTER — Telehealth: Payer: Self-pay

## 2016-03-25 NOTE — Telephone Encounter (Signed)
topamax and phenergan done erx

## 2016-03-25 NOTE — Telephone Encounter (Signed)
Patient has been notified of Rx ready for pick up.

## 2016-03-25 NOTE — Telephone Encounter (Signed)
Notified to pick up

## 2016-03-26 DIAGNOSIS — L97222 Non-pressure chronic ulcer of left calf with fat layer exposed: Secondary | ICD-10-CM | POA: Diagnosis not present

## 2016-04-01 DIAGNOSIS — L97222 Non-pressure chronic ulcer of left calf with fat layer exposed: Secondary | ICD-10-CM | POA: Diagnosis not present

## 2016-04-01 DIAGNOSIS — Z23 Encounter for immunization: Secondary | ICD-10-CM | POA: Diagnosis not present

## 2016-04-08 DIAGNOSIS — L97222 Non-pressure chronic ulcer of left calf with fat layer exposed: Secondary | ICD-10-CM | POA: Diagnosis not present

## 2016-04-15 DIAGNOSIS — L97222 Non-pressure chronic ulcer of left calf with fat layer exposed: Secondary | ICD-10-CM | POA: Diagnosis not present

## 2016-04-16 ENCOUNTER — Encounter: Payer: Self-pay | Admitting: Registered Nurse

## 2016-04-16 ENCOUNTER — Encounter: Payer: Medicare Other | Attending: Physical Medicine and Rehabilitation | Admitting: Registered Nurse

## 2016-04-16 VITALS — BP 125/73 | HR 81 | Resp 14

## 2016-04-16 DIAGNOSIS — M47816 Spondylosis without myelopathy or radiculopathy, lumbar region: Secondary | ICD-10-CM | POA: Diagnosis not present

## 2016-04-16 DIAGNOSIS — Z5181 Encounter for therapeutic drug level monitoring: Secondary | ICD-10-CM

## 2016-04-16 DIAGNOSIS — Z89519 Acquired absence of unspecified leg below knee: Secondary | ICD-10-CM | POA: Diagnosis not present

## 2016-04-16 DIAGNOSIS — L98499 Non-pressure chronic ulcer of skin of other sites with unspecified severity: Secondary | ICD-10-CM

## 2016-04-16 DIAGNOSIS — M4716 Other spondylosis with myelopathy, lumbar region: Secondary | ICD-10-CM | POA: Diagnosis not present

## 2016-04-16 DIAGNOSIS — G609 Hereditary and idiopathic neuropathy, unspecified: Secondary | ICD-10-CM

## 2016-04-16 DIAGNOSIS — M069 Rheumatoid arthritis, unspecified: Secondary | ICD-10-CM | POA: Insufficient documentation

## 2016-04-16 DIAGNOSIS — G546 Phantom limb syndrome with pain: Secondary | ICD-10-CM

## 2016-04-16 DIAGNOSIS — Z79899 Other long term (current) drug therapy: Secondary | ICD-10-CM

## 2016-04-16 DIAGNOSIS — G894 Chronic pain syndrome: Secondary | ICD-10-CM

## 2016-04-16 DIAGNOSIS — M179 Osteoarthritis of knee, unspecified: Secondary | ICD-10-CM | POA: Diagnosis not present

## 2016-04-16 MED ORDER — FENTANYL 75 MCG/HR TD PT72: 75.0000 ug | MEDICATED_PATCH | TRANSDERMAL | 0 refills | Status: DC

## 2016-04-16 MED ORDER — OXYCODONE-ACETAMINOPHEN 10-325 MG PO TABS: 1.0000 | ORAL_TABLET | Freq: Four times a day (QID) | ORAL | 0 refills | Status: DC | PRN

## 2016-04-16 NOTE — Progress Notes (Signed)
Subjective:    Patient ID: Crystal Fitzpatrick, female    DOB: 09-14-1945, 70 y.o.   MRN: [id number]  HPI: [name] is a 70 year old female who returns for follow up for chronic pain and medication refill. She states her pain is located in her upper- lower back, right stump pain ( phantom) and left lower extremity pain.She rates her pain 5.Her current exercise regime is performing stretching exercises. Also states she's receiving wound care at [facility] in [facility] weekly. Left lower extremity dressing intact.   Pain Inventory Average Pain 6 Pain Right Now 5 My pain is sharp and burning  In the last 24 hours, has pain interfered with the following? General activity 4 Relation with others 5 Enjoyment of life 1 What TIME of day is your pain at its worst? morning, night Sleep (in general) Fair  Pain is worse with: bending and inactivity Pain improves with: rest, therapy/exercise and medication Relief from Meds: no selection  Mobility Do you have any goals in this area?  no  Function Do you have any goals in this area?  no  Neuro/Psych No problems in this area  Prior Studies Any changes since last visit?  no  Physicians involved in your care Any changes since last visit?  no   Family History  Problem Relation Age of Onset  . Breast cancer Sister   . Diabetes Other   . Stroke Other     Grandparents   Social History   Social History  . Marital status: Divorced    Spouse name: N/A  . Number of children: 1  . Years of education: N/A   Occupational History  . retired Marketing executive Retired   Social History Main Topics  . Smoking status: Former Games developer  . Smokeless tobacco: Never Used  . Alcohol use No  . Drug use: No  . Sexual activity: Not Asked   Other Topics Concern  . None   Social History Narrative  . None   Past Surgical History:  Procedure Laterality Date  . AMPUTATION  02/02/2008   below right knee  . foot surgury   1980 and 1981  . NASAL SEPTUM SURGERY    . 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 s/p Breast biopsy  1992  . s/p EGD  2007   with Botox for? Achalasia  . TONSILLECTOMY    . TUBAL LIGATION     Past Medical History:  Diagnosis Date  . ABDOMINAL DISTENSION 10/11/2008  . Acute gouty arthropathy 12/11/2008  . Altered mental status 02/07/2010  . AMPUTATION, BELOW KNEE, RIGHT, HX OF 01/28/2009  . ANAPHYLACTIC SHOCK 05/15/2009  . ANEMIA-NOS 07/17/2008  . ANXIETY 07/17/2008  . ARTHRITIS 01/28/2009  . B12 DEFICIENCY 07/17/2008  . Cellulitis and abscess of leg, except foot 11/15/2008  . CHF 01/28/2009  . Chronic pain syndrome 02/14/2010  . COLONIC POLYPS, HX OF 07/17/2008  . DIABETES MELLITUS, TYPE II 07/17/2008  . Dysuria 11/11/2009  . FOOT PAIN 07/17/2008  . GERD 07/17/2008  . HEARING LOSS, RIGHT EAR 06/04/2009  . HYPERKALEMIA 10/31/2009  . Hyperlipemia 09/24/2010  . Hyperlipidemia 09/24/2010  . HYPERSOMNIA 02/14/2010  . HYPERTENSION 07/17/2008  . HYPONATREMIA 03/20/2010  . Hypoxemia 03/20/2010  . IBS 06/04/2009  . MENOPAUSAL DISORDER 12/03/2009  . NEUROPATHY, HX OF 01/28/2009  . Osteoporosis 11/16/2014  . PERIPHERAL EDEMA 08/30/2008  . Pneumonia, aspiration (HCC) 12/01/2011   Episode July 2013, tx per [facility]  . Pneumonia, organism unspecified(486) 02/14/2010  . RENAL INSUFFICIENCY 10/31/2009  . Rheumatoid arthritis(714.0) 07/17/2008  . Seizure (HCC) 10/26/2010  . SKIN LESION 06/04/2009  . SKIN ULCER, CHRONIC 02/08/2009  . Spinal stenosis of lumbar region 05/15/2015  . Trigeminal neuralgia 02/14/2010  . UNSPECIFIED HEPATITIS 01/28/2009  . Wheezing 12/03/2009   BP 125/73 (BP Location: Left Arm, Patient Position: Sitting, Cuff Size: Normal)   Pulse 81   Resp 14   SpO2 90%   Opioid Risk Score:   Fall Risk Score:  `1  Depression screen PHQ 2/9  Depression screen [facility] 2/9 01/27/2016 12/26/2015 09/17/2015 02/25/2015 02/12/2015 01/15/2015 12/19/2014  Decreased Interest 0 0 0 1 0 0 0  Down,  Depressed, Hopeless 0 0 0 1 0 0 0  PHQ - 2 Score 0 0 0 2 0 0 0  Altered sleeping - - - 3 - - -  Tired, decreased energy - - - 3 - - -  Change in appetite - - - - - - -  Feeling bad or failure about yourself  - - - - - - -  Trouble concentrating - - - 0 - - -  Moving slowly or fidgety/restless - - - 0 - - -  Suicidal thoughts - - - 0 - - -  PHQ-9 Score - - - 8 - - -  Difficult doing work/chores - - - Not difficult at all - - -  Some recent data might be hidden    Review of Systems  Constitutional: Negative.   HENT: Negative.   Eyes: Negative.   Respiratory: Negative.   Cardiovascular: Negative.   Gastrointestinal: Negative.   Endocrine: Negative.   Genitourinary: Negative.   Musculoskeletal: Negative.   Skin: Negative.   Allergic/Immunologic: Negative.   Neurological: Negative.   Hematological: Negative.   Psychiatric/Behavioral: Negative.   All other systems reviewed and are negative.      Objective:   Physical Exam  Constitutional: She is oriented to person, place, and time. She appears well-developed and well-nourished.  HENT:  Head: Normocephalic and atraumatic.  Neck: Normal range of motion. Neck supple.  Cardiovascular: Normal rate and regular rhythm.   Pulmonary/Chest: Effort normal and breath sounds normal.  Musculoskeletal:  Normal Muscle Bulk and Muscle Testing Reveals:  Upper Extremities: Full ROM and Muscle Strength 5/5 Thoracic Paraspinal Tenderness: T-1-T-3 Lumbar Paraspinal Tenderness: L-3-L-5 Lower Extremities: Right: BKA  Left: Full ROM and Muscle Strength 5/5 Left Lower Extremity Flexion Produces Pain into Patella Left Lower Extremity Dressing Intact Arrived in Wheelchair  Neurological: She is alert and oriented to person, place, and time.  Skin: Skin is warm and dry.  Psychiatric: She has a normal mood and affect.  Nursing note and vitals reviewed.         Assessment & Plan:  1.Right below-knee amputation, history of phantom limb pain.:  Continue Current Medication Regime and Continue to Monitor.  2. Peripheral neuropathy: Continue Gabapentin.  Refilled: Fenatnyl Patch 75 mcg one patch every three days #10  (only one script given) and Continue Oxycodone 10/325mg  one tablet every 6 hours as needed #120.  We will continue the opioid monitoring program, this consists of regular clinic visits, examinations, urine drug screen, pill counts as well as use of [place] Controlled Substance Reporting System. 3. History of rheumatoid arthritis. Continue Current Medication and Exercise and heat Therapy.  4. Osteoarthritis of left knee: Continue with Voltaren gel/ Exercise and heat therapy.  5. Chronic cellulitis, left leg with  associated edema:PCP Following/ Attending Wound Care weekly. 6. Lumbar Spondylosis: Continue current medication regime. Continue HEP.  20 minutes of face to face patient care time was spent during this visit. All questions were encouraged and answered.  F/U in 1 month

## 2016-04-22 DIAGNOSIS — L97222 Non-pressure chronic ulcer of left calf with fat layer exposed: Secondary | ICD-10-CM | POA: Diagnosis not present

## 2016-04-29 DIAGNOSIS — L98499 Non-pressure chronic ulcer of skin of other sites with unspecified severity: Secondary | ICD-10-CM | POA: Diagnosis not present

## 2016-04-29 DIAGNOSIS — E11622 Type 2 diabetes mellitus with other skin ulcer: Secondary | ICD-10-CM | POA: Diagnosis not present

## 2016-04-29 DIAGNOSIS — I890 Lymphedema, not elsewhere classified: Secondary | ICD-10-CM | POA: Diagnosis not present

## 2016-04-29 DIAGNOSIS — L97222 Non-pressure chronic ulcer of left calf with fat layer exposed: Secondary | ICD-10-CM | POA: Diagnosis not present

## 2016-04-29 DIAGNOSIS — I872 Venous insufficiency (chronic) (peripheral): Secondary | ICD-10-CM | POA: Diagnosis not present

## 2016-05-05 DIAGNOSIS — L97222 Non-pressure chronic ulcer of left calf with fat layer exposed: Secondary | ICD-10-CM | POA: Diagnosis not present

## 2016-05-14 DIAGNOSIS — L97222 Non-pressure chronic ulcer of left calf with fat layer exposed: Secondary | ICD-10-CM | POA: Diagnosis not present

## 2016-05-14 DIAGNOSIS — I872 Venous insufficiency (chronic) (peripheral): Secondary | ICD-10-CM | POA: Diagnosis not present

## 2016-05-14 DIAGNOSIS — I890 Lymphedema, not elsewhere classified: Secondary | ICD-10-CM | POA: Diagnosis not present

## 2016-05-15 ENCOUNTER — Encounter: Payer: Self-pay | Admitting: Registered Nurse

## 2016-05-15 ENCOUNTER — Encounter: Payer: Medicare Other | Attending: Physical Medicine and Rehabilitation | Admitting: Registered Nurse

## 2016-05-15 VITALS — BP 131/79 | HR 74 | Resp 14

## 2016-05-15 DIAGNOSIS — L98499 Non-pressure chronic ulcer of skin of other sites with unspecified severity: Secondary | ICD-10-CM | POA: Diagnosis not present

## 2016-05-15 DIAGNOSIS — M069 Rheumatoid arthritis, unspecified: Secondary | ICD-10-CM | POA: Diagnosis not present

## 2016-05-15 DIAGNOSIS — G546 Phantom limb syndrome with pain: Secondary | ICD-10-CM

## 2016-05-15 DIAGNOSIS — Z89519 Acquired absence of unspecified leg below knee: Secondary | ICD-10-CM | POA: Insufficient documentation

## 2016-05-15 DIAGNOSIS — M179 Osteoarthritis of knee, unspecified: Secondary | ICD-10-CM | POA: Insufficient documentation

## 2016-05-15 DIAGNOSIS — M4716 Other spondylosis with myelopathy, lumbar region: Secondary | ICD-10-CM | POA: Diagnosis not present

## 2016-05-15 DIAGNOSIS — M47816 Spondylosis without myelopathy or radiculopathy, lumbar region: Secondary | ICD-10-CM | POA: Diagnosis not present

## 2016-05-15 DIAGNOSIS — G609 Hereditary and idiopathic neuropathy, unspecified: Secondary | ICD-10-CM | POA: Insufficient documentation

## 2016-05-15 DIAGNOSIS — Z5181 Encounter for therapeutic drug level monitoring: Secondary | ICD-10-CM

## 2016-05-15 DIAGNOSIS — Z79899 Other long term (current) drug therapy: Secondary | ICD-10-CM

## 2016-05-15 DIAGNOSIS — G894 Chronic pain syndrome: Secondary | ICD-10-CM

## 2016-05-15 MED ORDER — OXYCODONE-ACETAMINOPHEN 10-325 MG PO TABS: 1.0000 | ORAL_TABLET | Freq: Four times a day (QID) | ORAL | 0 refills | Status: DC | PRN

## 2016-05-15 MED ORDER — FENTANYL 75 MCG/HR TD PT72: 75.0000 ug | MEDICATED_PATCH | TRANSDERMAL | 0 refills | Status: DC

## 2016-05-15 NOTE — Progress Notes (Signed)
Subjective:    Patient ID: Crystal Fitzpatrick, female    DOB: 1945/12/15, 70 y.o.   MRN: [id number]  HPI: Crystal Fitzpatrick is a 70year old female who returns for follow up for chronic pain and medication refill. She states her pain is located in her neck, right shoulder,lower back, right stump pain ( phantom) and left lower extremity pain.She rates her pain 6. Her current exercise regime is performing stretching exercises.  Also states she's still receiving wound care at [facility] in [facility] weekly and sometimes bi-weekly. Left lower extremity dressing intact.    Pain Inventory Average Pain 6 Pain Right Now 6 My pain is intermittent, constant, sharp, burning, dull and stabbing  In the last 24 hours, has pain interfered with the following? General activity 3 Relation with others 2 Enjoyment of life 1 What TIME of day is your pain at its worst? morning, night Sleep (in general) Fair  Pain is worse with: inactivity Pain improves with: rest, therapy/exercise and TENS Relief from Meds: 6  Mobility Do you have any goals in this area?  yes  Function Do you have any goals in this area?  yes  Neuro/Psych bladder control problems  Prior Studies Any changes since last visit?  no  Physicians involved in your care Any changes since last visit?  no   Family History  Problem Relation Age of Onset  . Breast cancer Sister   . Diabetes Other   . Stroke Other     Grandparents   Social History   Social History  . Marital status: Divorced    Spouse name: N/A  . Number of children: 1  . Years of education: N/A   Occupational History  . retired Marketing executive Retired   Social History Main Topics  . Smoking status: Former Games developer  . Smokeless tobacco: Never Used  . Alcohol use No  . Drug use: No  . Sexual activity: Not Asked   Other Topics Concern  . None   Social History Narrative  . None   Past Surgical History:  Procedure Laterality Date    . AMPUTATION  02/02/2008   below right knee  . foot surgury  1980 and 1981  . NASAL SEPTUM SURGERY    . 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 s/p Breast biopsy  1992  . s/p EGD  2007   with Botox for? Achalasia  . TONSILLECTOMY    . TUBAL LIGATION     Past Medical History:  Diagnosis Date  . ABDOMINAL DISTENSION 10/11/2008  . Acute gouty arthropathy 12/11/2008  . Altered mental status 02/07/2010  . AMPUTATION, BELOW KNEE, RIGHT, HX OF 01/28/2009  . ANAPHYLACTIC SHOCK 05/15/2009  . ANEMIA-NOS 07/17/2008  . ANXIETY 07/17/2008  . ARTHRITIS 01/28/2009  . B12 DEFICIENCY 07/17/2008  . Cellulitis and abscess of leg, except foot 11/15/2008  . CHF 01/28/2009  . Chronic pain syndrome 02/14/2010  . COLONIC POLYPS, HX OF 07/17/2008  . DIABETES MELLITUS, TYPE II 07/17/2008  . Dysuria 11/11/2009  . FOOT PAIN 07/17/2008  . GERD 07/17/2008  . HEARING LOSS, RIGHT EAR 06/04/2009  . HYPERKALEMIA 10/31/2009  . Hyperlipemia 09/24/2010  . Hyperlipidemia 09/24/2010  . HYPERSOMNIA 02/14/2010  . HYPERTENSION 07/17/2008  . HYPONATREMIA 03/20/2010  . Hypoxemia 03/20/2010  . IBS 06/04/2009  . MENOPAUSAL DISORDER 12/03/2009  . NEUROPATHY, HX OF 01/28/2009  . Osteoporosis 11/16/2014  . PERIPHERAL EDEMA 08/30/2008  . Pneumonia, aspiration (HCC) 12/01/2011  Episode July 2013, tx per [facility]  . Pneumonia, organism unspecified(486) 02/14/2010  . RENAL INSUFFICIENCY 10/31/2009  . Rheumatoid arthritis(714.0) 07/17/2008  . Seizure (HCC) 10/26/2010  . SKIN LESION 06/04/2009  . SKIN ULCER, CHRONIC 02/08/2009  . Spinal stenosis of lumbar region 05/15/2015  . Trigeminal neuralgia 02/14/2010  . UNSPECIFIED HEPATITIS 01/28/2009  . Wheezing 12/03/2009   BP 131/79   Pulse 74   Resp 14   SpO2 91%   Opioid Risk Score:   Fall Risk Score:  `1  Depression screen PHQ 2/9  Depression screen [facility] 2/9 01/27/2016 12/26/2015 09/17/2015 02/25/2015 02/12/2015 01/15/2015 12/19/2014  Decreased Interest 0 0 0 1 0 0 0  Down, Depressed,  Hopeless 0 0 0 1 0 0 0  PHQ - 2 Score 0 0 0 2 0 0 0  Altered sleeping - - - 3 - - -  Tired, decreased energy - - - 3 - - -  Change in appetite - - - - - - -  Feeling bad or failure about yourself  - - - - - - -  Trouble concentrating - - - 0 - - -  Moving slowly or fidgety/restless - - - 0 - - -  Suicidal thoughts - - - 0 - - -  PHQ-9 Score - - - 8 - - -  Difficult doing work/chores - - - Not difficult at all - - -  Some recent data might be hidden    Review of Systems  Constitutional: Negative.   HENT: Negative.   Eyes: Negative.   Respiratory: Negative.   Cardiovascular: Negative.   Gastrointestinal: Negative.   Endocrine: Negative.   Genitourinary: Negative.   Musculoskeletal: Negative.   Allergic/Immunologic: Negative.   Neurological: Negative.   Hematological: Negative.   Psychiatric/Behavioral: Negative.   All other systems reviewed and are negative.      Objective:   Physical Exam  Constitutional: She is oriented to person, place, and time. She appears well-developed and well-nourished.  HENT:  Head: Normocephalic and atraumatic.  Neck: Normal range of motion. Neck supple.  Cardiovascular: Normal rate and regular rhythm.   Pulmonary/Chest: Effort normal and breath sounds normal.  Musculoskeletal:  Normal Muscle Bulk and Muscle Testing Reveals: Upper Extremities: Full ROM and Muscle Strength 5/5 Right AC Joint Tenderness Back without spinal tenderness Noted Lower Extremities: Right: BKA Left Lower Extremity: Full ROM and Muscle Strength 5/5 Left Lower Extremity dressing Intact  Neurological: She is alert and oriented to person, place, and time.  Skin: Skin is warm and dry.  Psychiatric: She has a normal mood and affect.  Nursing note and vitals reviewed.         Assessment & Plan:  1.Right below-knee amputation, history of phantom limb pain.: Continue Current Medication Regime and Continue to Monitor.  2. Peripheral neuropathy: Continue Gabapentin.   Refilled: Fenatnyl Patch 75 mcg one patch every three days #10   and Continue Oxycodone 10/325mg  one tablet every 6 hours as needed #120.  We will continue the opioid monitoring program, this consists of regular clinic visits, examinations, urine drug screen, pill counts as well as use of [place] Controlled Substance Reporting System. 3. History of rheumatoid arthritis. Continue Current Medication and Exercise and heat Therapy.  4. Osteoarthritis of left knee: Continue with Voltaren gel/ Exercise and heat therapy.  5. Chronic cellulitis, left leg with associated edema:PCP Following/ Attending Wound Care weekly and occasionally Bi-Weekly.. 6. Lumbar Spondylosis: Continue current medication regime. Continue HEP.  20 minutes of face to face patient care time was spent during this visit. All questions were encouraged and answered.  F/U in 1 month

## 2016-05-21 DIAGNOSIS — E11622 Type 2 diabetes mellitus with other skin ulcer: Secondary | ICD-10-CM | POA: Diagnosis not present

## 2016-05-21 DIAGNOSIS — L97222 Non-pressure chronic ulcer of left calf with fat layer exposed: Secondary | ICD-10-CM | POA: Diagnosis not present

## 2016-05-21 DIAGNOSIS — L98499 Non-pressure chronic ulcer of skin of other sites with unspecified severity: Secondary | ICD-10-CM | POA: Diagnosis not present

## 2016-05-21 DIAGNOSIS — S8012XD Contusion of left lower leg, subsequent encounter: Secondary | ICD-10-CM | POA: Diagnosis not present

## 2016-05-21 DIAGNOSIS — I890 Lymphedema, not elsewhere classified: Secondary | ICD-10-CM | POA: Diagnosis not present

## 2016-05-21 DIAGNOSIS — I872 Venous insufficiency (chronic) (peripheral): Secondary | ICD-10-CM | POA: Diagnosis not present

## 2016-06-02 DIAGNOSIS — Z8673 Personal history of transient ischemic attack (TIA), and cerebral infarction without residual deficits: Secondary | ICD-10-CM | POA: Diagnosis not present

## 2016-06-02 DIAGNOSIS — M069 Rheumatoid arthritis, unspecified: Secondary | ICD-10-CM | POA: Diagnosis not present

## 2016-06-02 DIAGNOSIS — M810 Age-related osteoporosis without current pathological fracture: Secondary | ICD-10-CM | POA: Diagnosis not present

## 2016-06-02 DIAGNOSIS — I890 Lymphedema, not elsewhere classified: Secondary | ICD-10-CM | POA: Diagnosis not present

## 2016-06-02 DIAGNOSIS — I509 Heart failure, unspecified: Secondary | ICD-10-CM | POA: Diagnosis not present

## 2016-06-02 DIAGNOSIS — N179 Acute kidney failure, unspecified: Secondary | ICD-10-CM | POA: Diagnosis not present

## 2016-06-02 DIAGNOSIS — G8929 Other chronic pain: Secondary | ICD-10-CM | POA: Diagnosis not present

## 2016-06-02 DIAGNOSIS — E11622 Type 2 diabetes mellitus with other skin ulcer: Secondary | ICD-10-CM | POA: Diagnosis not present

## 2016-06-02 DIAGNOSIS — K219 Gastro-esophageal reflux disease without esophagitis: Secondary | ICD-10-CM | POA: Diagnosis not present

## 2016-06-02 DIAGNOSIS — D649 Anemia, unspecified: Secondary | ICD-10-CM | POA: Diagnosis not present

## 2016-06-02 DIAGNOSIS — Z87891 Personal history of nicotine dependence: Secondary | ICD-10-CM | POA: Diagnosis not present

## 2016-06-02 DIAGNOSIS — S8012XD Contusion of left lower leg, subsequent encounter: Secondary | ICD-10-CM | POA: Diagnosis not present

## 2016-06-02 DIAGNOSIS — E1122 Type 2 diabetes mellitus with diabetic chronic kidney disease: Secondary | ICD-10-CM | POA: Diagnosis not present

## 2016-06-02 DIAGNOSIS — I129 Hypertensive chronic kidney disease with stage 1 through stage 4 chronic kidney disease, or unspecified chronic kidney disease: Secondary | ICD-10-CM | POA: Diagnosis not present

## 2016-06-02 DIAGNOSIS — L97222 Non-pressure chronic ulcer of left calf with fat layer exposed: Secondary | ICD-10-CM | POA: Diagnosis not present

## 2016-06-02 DIAGNOSIS — N189 Chronic kidney disease, unspecified: Secondary | ICD-10-CM | POA: Diagnosis not present

## 2016-06-02 DIAGNOSIS — I872 Venous insufficiency (chronic) (peripheral): Secondary | ICD-10-CM | POA: Diagnosis not present

## 2016-06-08 DIAGNOSIS — I129 Hypertensive chronic kidney disease with stage 1 through stage 4 chronic kidney disease, or unspecified chronic kidney disease: Secondary | ICD-10-CM | POA: Diagnosis not present

## 2016-06-08 DIAGNOSIS — E11622 Type 2 diabetes mellitus with other skin ulcer: Secondary | ICD-10-CM | POA: Diagnosis not present

## 2016-06-08 DIAGNOSIS — I890 Lymphedema, not elsewhere classified: Secondary | ICD-10-CM | POA: Diagnosis not present

## 2016-06-08 DIAGNOSIS — Z8673 Personal history of transient ischemic attack (TIA), and cerebral infarction without residual deficits: Secondary | ICD-10-CM | POA: Diagnosis not present

## 2016-06-08 DIAGNOSIS — I872 Venous insufficiency (chronic) (peripheral): Secondary | ICD-10-CM | POA: Diagnosis not present

## 2016-06-08 DIAGNOSIS — M1990 Unspecified osteoarthritis, unspecified site: Secondary | ICD-10-CM | POA: Diagnosis not present

## 2016-06-08 DIAGNOSIS — S8012XD Contusion of left lower leg, subsequent encounter: Secondary | ICD-10-CM | POA: Diagnosis not present

## 2016-06-08 DIAGNOSIS — K219 Gastro-esophageal reflux disease without esophagitis: Secondary | ICD-10-CM | POA: Diagnosis not present

## 2016-06-08 DIAGNOSIS — L97222 Non-pressure chronic ulcer of left calf with fat layer exposed: Secondary | ICD-10-CM | POA: Diagnosis not present

## 2016-06-08 DIAGNOSIS — N189 Chronic kidney disease, unspecified: Secondary | ICD-10-CM | POA: Diagnosis not present

## 2016-06-08 DIAGNOSIS — M069 Rheumatoid arthritis, unspecified: Secondary | ICD-10-CM | POA: Diagnosis not present

## 2016-06-08 DIAGNOSIS — E1122 Type 2 diabetes mellitus with diabetic chronic kidney disease: Secondary | ICD-10-CM | POA: Diagnosis not present

## 2016-06-08 DIAGNOSIS — Z87891 Personal history of nicotine dependence: Secondary | ICD-10-CM | POA: Diagnosis not present

## 2016-06-08 DIAGNOSIS — I509 Heart failure, unspecified: Secondary | ICD-10-CM | POA: Diagnosis not present

## 2016-06-08 DIAGNOSIS — M810 Age-related osteoporosis without current pathological fracture: Secondary | ICD-10-CM | POA: Diagnosis not present

## 2016-06-08 DIAGNOSIS — I739 Peripheral vascular disease, unspecified: Secondary | ICD-10-CM | POA: Diagnosis not present

## 2016-06-09 ENCOUNTER — Encounter: Payer: Self-pay | Admitting: Registered Nurse

## 2016-06-09 ENCOUNTER — Encounter: Payer: Medicare Other | Attending: Physical Medicine and Rehabilitation | Admitting: Registered Nurse

## 2016-06-09 VITALS — BP 115/80 | HR 89 | Resp 87

## 2016-06-09 DIAGNOSIS — Z89519 Acquired absence of unspecified leg below knee: Secondary | ICD-10-CM | POA: Insufficient documentation

## 2016-06-09 DIAGNOSIS — M4716 Other spondylosis with myelopathy, lumbar region: Secondary | ICD-10-CM | POA: Insufficient documentation

## 2016-06-09 DIAGNOSIS — G894 Chronic pain syndrome: Secondary | ICD-10-CM

## 2016-06-09 DIAGNOSIS — M47816 Spondylosis without myelopathy or radiculopathy, lumbar region: Secondary | ICD-10-CM | POA: Diagnosis not present

## 2016-06-09 DIAGNOSIS — M179 Osteoarthritis of knee, unspecified: Secondary | ICD-10-CM | POA: Insufficient documentation

## 2016-06-09 DIAGNOSIS — G609 Hereditary and idiopathic neuropathy, unspecified: Secondary | ICD-10-CM | POA: Diagnosis not present

## 2016-06-09 DIAGNOSIS — M069 Rheumatoid arthritis, unspecified: Secondary | ICD-10-CM | POA: Insufficient documentation

## 2016-06-09 DIAGNOSIS — L98499 Non-pressure chronic ulcer of skin of other sites with unspecified severity: Secondary | ICD-10-CM

## 2016-06-09 DIAGNOSIS — Z79899 Other long term (current) drug therapy: Secondary | ICD-10-CM

## 2016-06-09 DIAGNOSIS — G546 Phantom limb syndrome with pain: Secondary | ICD-10-CM | POA: Diagnosis not present

## 2016-06-09 DIAGNOSIS — Z5181 Encounter for therapeutic drug level monitoring: Secondary | ICD-10-CM

## 2016-06-09 MED ORDER — PROMETHAZINE HCL 25 MG PO TABS: 25.0000 mg | ORAL_TABLET | Freq: Four times a day (QID) | ORAL | 2 refills | Status: DC | PRN

## 2016-06-09 MED ORDER — FENTANYL 75 MCG/HR TD PT72: 75.0000 ug | MEDICATED_PATCH | TRANSDERMAL | 0 refills | Status: DC

## 2016-06-09 MED ORDER — OXYCODONE-ACETAMINOPHEN 10-325 MG PO TABS
1.0000 | ORAL_TABLET | Freq: Four times a day (QID) | ORAL | 0 refills | Status: DC | PRN
Start: 2016-06-09 — End: 2016-07-16

## 2016-06-09 NOTE — Progress Notes (Signed)
Subjective:    Patient ID: Crystal Fitzpatrick, female    DOB: 1946/04/23, 71 y.o.   MRN: [id number]  HPI:  Crystal Fitzpatrick is a 71year old female who returns for follow up appointment for chronic pain and medication refill. She states her pain is located in her right hip and  right stump pain ( phantom). She rates her pain 3. Her current exercise regime is performing stretching exercises and Yoga daily.  Also states she's still receiving wound care at [facility] in [facility] weekly. Left lower extremity dressing intact.   Refilled her Phenergan, she has an appointment with new PCP this week.   Pain Inventory Average Pain 5 Pain Right Now 3 My pain is constant, sharp and burning  In the last 24 hours, has pain interfered with the following? General activity 4 Relation with others 5 Enjoyment of life 2 What TIME of day is your pain at its worst? morning, night Sleep (in general) Fair  Pain is worse with: sitting, inactivity and some activites Pain improves with: medication Relief from Meds: 8  Mobility Do you have any goals in this area?  no  Function Do you have any goals in this area?  no  Neuro/Psych No problems in this area  Prior Studies Any changes since last visit?  no  Physicians involved in your care Any changes since last visit?  no   Family History  Problem Relation Age of Onset  . Breast cancer Sister   . Diabetes Other   . Stroke Other     Grandparents   Social History   Social History  . Marital status: Divorced    Spouse name: N/A  . Number of children: 1  . Years of education: N/A   Occupational History  . retired Marketing executive Retired   Social History Main Topics  . Smoking status: Former Games developer  . Smokeless tobacco: Never Used  . Alcohol use No  . Drug use: No  . Sexual activity: Not Asked   Other Topics Concern  . None   Social History Narrative  . None   Past Surgical History:  Procedure Laterality  Date  . AMPUTATION  02/02/2008   below right knee  . foot surgury  1980 and 1981  . NASAL SEPTUM SURGERY    . 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 s/p Breast biopsy  1992  . s/p EGD  2007   with Botox for? Achalasia  . TONSILLECTOMY    . TUBAL LIGATION     Past Medical History:  Diagnosis Date  . ABDOMINAL DISTENSION 10/11/2008  . Acute gouty arthropathy 12/11/2008  . Altered mental status 02/07/2010  . AMPUTATION, BELOW KNEE, RIGHT, HX OF 01/28/2009  . ANAPHYLACTIC SHOCK 05/15/2009  . ANEMIA-NOS 07/17/2008  . ANXIETY 07/17/2008  . ARTHRITIS 01/28/2009  . B12 DEFICIENCY 07/17/2008  . Cellulitis and abscess of leg, except foot 11/15/2008  . CHF 01/28/2009  . Chronic pain syndrome 02/14/2010  . COLONIC POLYPS, HX OF 07/17/2008  . DIABETES MELLITUS, TYPE II 07/17/2008  . Dysuria 11/11/2009  . FOOT PAIN 07/17/2008  . GERD 07/17/2008  . HEARING LOSS, RIGHT EAR 06/04/2009  . HYPERKALEMIA 10/31/2009  . Hyperlipemia 09/24/2010  . Hyperlipidemia 09/24/2010  . HYPERSOMNIA 02/14/2010  . HYPERTENSION 07/17/2008  . HYPONATREMIA 03/20/2010  . Hypoxemia 03/20/2010  . IBS 06/04/2009  . MENOPAUSAL DISORDER 12/03/2009  . NEUROPATHY, HX OF 01/28/2009  . Osteoporosis 11/16/2014  . PERIPHERAL  EDEMA 08/30/2008  . Pneumonia, aspiration (HCC) 12/01/2011   Episode July 2013, tx per [facility]  . Pneumonia, organism unspecified(486) 02/14/2010  . RENAL INSUFFICIENCY 10/31/2009  . Rheumatoid arthritis(714.0) 07/17/2008  . Seizure (HCC) 10/26/2010  . SKIN LESION 06/04/2009  . SKIN ULCER, CHRONIC 02/08/2009  . Spinal stenosis of lumbar region 05/15/2015  . Trigeminal neuralgia 02/14/2010  . UNSPECIFIED HEPATITIS 01/28/2009  . Wheezing 12/03/2009   BP 115/80   Pulse 89   Resp (!) 87   SpO2 92%   Opioid Risk Score:   Fall Risk Score:  `1  Depression screen PHQ 2/9  Depression screen [facility] 2/9 01/27/2016 12/26/2015 09/17/2015 02/25/2015 02/12/2015 01/15/2015 12/19/2014  Decreased Interest 0 0 0 1 0 0 0  Down,  Depressed, Hopeless 0 0 0 1 0 0 0  PHQ - 2 Score 0 0 0 2 0 0 0  Altered sleeping - - - 3 - - -  Tired, decreased energy - - - 3 - - -  Change in appetite - - - - - - -  Feeling bad or failure about yourself  - - - - - - -  Trouble concentrating - - - 0 - - -  Moving slowly or fidgety/restless - - - 0 - - -  Suicidal thoughts - - - 0 - - -  PHQ-9 Score - - - 8 - - -  Difficult doing work/chores - - - Not difficult at all - - -  Some recent data might be hidden    Review of Systems  Constitutional: Negative.   HENT: Negative.   Eyes: Negative.   Respiratory: Negative.   Cardiovascular: Negative.   Gastrointestinal: Negative.   Endocrine: Negative.   Genitourinary: Negative.   Musculoskeletal: Negative.   Skin: Negative.   Allergic/Immunologic: Negative.   Neurological: Negative.   Hematological: Negative.   Psychiatric/Behavioral: Negative.   All other systems reviewed and are negative.      Objective:   Physical Exam  Constitutional: She is oriented to person, place, and time. She appears well-developed and well-nourished.  HENT:  Head: Normocephalic and atraumatic.  Neck: Normal range of motion. Neck supple.  Cardiovascular: Normal rate and regular rhythm.   Pulmonary/Chest: Effort normal and breath sounds normal.  Musculoskeletal:  Normal Muscle Bulk and Muscle Testing Reveals: Upper Extremities: Full ROM and Muscle Strength 5/5 Back without spinal tenderness today Right Grater Trochanteric tenderness Lower Extremities: Right BKA Left Lower Extremity: Full ROM and Muscle Strength 5/5 Left Lower extremity dressing Intact Arrived in wheelchair  Neurological: She is alert and oriented to person, place, and time.  Skin: Skin is warm and dry.  Psychiatric: She has a normal mood and affect.  Nursing note and vitals reviewed.         Assessment & Plan:  1.Right below-knee amputation, history of phantom limb pain.: Continue Current Medication Regime and Continue  to Monitor.  2. Peripheral neuropathy: Continue Gabapentin.  Refilled: Fenatnyl Patch 75 mcg one patch every three days #10   and Continue Oxycodone 10/325mg  one tablet every 6 hours as needed #120.  We will continue the opioid monitoring program, this consists of regular clinic visits, examinations, urine drug screen, pill counts as well as use of [place] Controlled Substance Reporting System. 3. History of rheumatoid arthritis. Continue Current Medication and Exercise and heat Therapy.  4. Osteoarthritis of left knee: Continue with Voltaren gel/ Exercise and heat therapy.  5. Chronic cellulitis, left leg with associated edema:PCP  Following/ Attending Wound Care weekly and occasionally Bi-Weekly.. 6. Lumbar Spondylosis: Continue current medication regime. Continue HEP.  20 minutes of face to face patient care time was spent during this visit. All questions were encouraged and answered.  F/U in 1 month

## 2016-06-17 DIAGNOSIS — L97222 Non-pressure chronic ulcer of left calf with fat layer exposed: Secondary | ICD-10-CM | POA: Diagnosis not present

## 2016-06-24 DIAGNOSIS — L97222 Non-pressure chronic ulcer of left calf with fat layer exposed: Secondary | ICD-10-CM | POA: Diagnosis not present

## 2016-06-24 DIAGNOSIS — I129 Hypertensive chronic kidney disease with stage 1 through stage 4 chronic kidney disease, or unspecified chronic kidney disease: Secondary | ICD-10-CM | POA: Diagnosis not present

## 2016-06-24 DIAGNOSIS — M810 Age-related osteoporosis without current pathological fracture: Secondary | ICD-10-CM | POA: Diagnosis not present

## 2016-06-24 DIAGNOSIS — M069 Rheumatoid arthritis, unspecified: Secondary | ICD-10-CM | POA: Diagnosis not present

## 2016-06-24 DIAGNOSIS — Z8673 Personal history of transient ischemic attack (TIA), and cerebral infarction without residual deficits: Secondary | ICD-10-CM | POA: Diagnosis not present

## 2016-06-24 DIAGNOSIS — D649 Anemia, unspecified: Secondary | ICD-10-CM | POA: Diagnosis not present

## 2016-06-24 DIAGNOSIS — M1990 Unspecified osteoarthritis, unspecified site: Secondary | ICD-10-CM | POA: Diagnosis not present

## 2016-06-24 DIAGNOSIS — I509 Heart failure, unspecified: Secondary | ICD-10-CM | POA: Diagnosis not present

## 2016-06-24 DIAGNOSIS — N189 Chronic kidney disease, unspecified: Secondary | ICD-10-CM | POA: Diagnosis not present

## 2016-06-24 DIAGNOSIS — K219 Gastro-esophageal reflux disease without esophagitis: Secondary | ICD-10-CM | POA: Diagnosis not present

## 2016-06-24 DIAGNOSIS — G8929 Other chronic pain: Secondary | ICD-10-CM | POA: Diagnosis not present

## 2016-06-24 DIAGNOSIS — E1122 Type 2 diabetes mellitus with diabetic chronic kidney disease: Secondary | ICD-10-CM | POA: Diagnosis not present

## 2016-06-24 DIAGNOSIS — Z87891 Personal history of nicotine dependence: Secondary | ICD-10-CM | POA: Diagnosis not present

## 2016-07-01 DIAGNOSIS — E1122 Type 2 diabetes mellitus with diabetic chronic kidney disease: Secondary | ICD-10-CM | POA: Diagnosis not present

## 2016-07-01 DIAGNOSIS — S8012XD Contusion of left lower leg, subsequent encounter: Secondary | ICD-10-CM | POA: Diagnosis not present

## 2016-07-01 DIAGNOSIS — H9190 Unspecified hearing loss, unspecified ear: Secondary | ICD-10-CM | POA: Diagnosis not present

## 2016-07-01 DIAGNOSIS — Z8673 Personal history of transient ischemic attack (TIA), and cerebral infarction without residual deficits: Secondary | ICD-10-CM | POA: Diagnosis not present

## 2016-07-01 DIAGNOSIS — E11622 Type 2 diabetes mellitus with other skin ulcer: Secondary | ICD-10-CM | POA: Diagnosis not present

## 2016-07-01 DIAGNOSIS — I129 Hypertensive chronic kidney disease with stage 1 through stage 4 chronic kidney disease, or unspecified chronic kidney disease: Secondary | ICD-10-CM | POA: Diagnosis not present

## 2016-07-01 DIAGNOSIS — I872 Venous insufficiency (chronic) (peripheral): Secondary | ICD-10-CM | POA: Diagnosis not present

## 2016-07-01 DIAGNOSIS — L97222 Non-pressure chronic ulcer of left calf with fat layer exposed: Secondary | ICD-10-CM | POA: Diagnosis not present

## 2016-07-01 DIAGNOSIS — I890 Lymphedema, not elsewhere classified: Secondary | ICD-10-CM | POA: Diagnosis not present

## 2016-07-01 DIAGNOSIS — M1990 Unspecified osteoarthritis, unspecified site: Secondary | ICD-10-CM | POA: Diagnosis not present

## 2016-07-01 DIAGNOSIS — I509 Heart failure, unspecified: Secondary | ICD-10-CM | POA: Diagnosis not present

## 2016-07-01 DIAGNOSIS — K219 Gastro-esophageal reflux disease without esophagitis: Secondary | ICD-10-CM | POA: Diagnosis not present

## 2016-07-01 DIAGNOSIS — G8929 Other chronic pain: Secondary | ICD-10-CM | POA: Diagnosis not present

## 2016-07-01 DIAGNOSIS — N189 Chronic kidney disease, unspecified: Secondary | ICD-10-CM | POA: Diagnosis not present

## 2016-07-01 DIAGNOSIS — D649 Anemia, unspecified: Secondary | ICD-10-CM | POA: Diagnosis not present

## 2016-07-01 DIAGNOSIS — M069 Rheumatoid arthritis, unspecified: Secondary | ICD-10-CM | POA: Diagnosis not present

## 2016-07-01 DIAGNOSIS — Z87891 Personal history of nicotine dependence: Secondary | ICD-10-CM | POA: Diagnosis not present

## 2016-07-01 DIAGNOSIS — M810 Age-related osteoporosis without current pathological fracture: Secondary | ICD-10-CM | POA: Diagnosis not present

## 2016-07-07 DIAGNOSIS — K219 Gastro-esophageal reflux disease without esophagitis: Secondary | ICD-10-CM | POA: Diagnosis not present

## 2016-07-07 DIAGNOSIS — I890 Lymphedema, not elsewhere classified: Secondary | ICD-10-CM | POA: Diagnosis not present

## 2016-07-07 DIAGNOSIS — M1990 Unspecified osteoarthritis, unspecified site: Secondary | ICD-10-CM | POA: Diagnosis not present

## 2016-07-07 DIAGNOSIS — E11622 Type 2 diabetes mellitus with other skin ulcer: Secondary | ICD-10-CM | POA: Diagnosis not present

## 2016-07-07 DIAGNOSIS — E1122 Type 2 diabetes mellitus with diabetic chronic kidney disease: Secondary | ICD-10-CM | POA: Diagnosis not present

## 2016-07-07 DIAGNOSIS — I509 Heart failure, unspecified: Secondary | ICD-10-CM | POA: Diagnosis not present

## 2016-07-07 DIAGNOSIS — Z8673 Personal history of transient ischemic attack (TIA), and cerebral infarction without residual deficits: Secondary | ICD-10-CM | POA: Diagnosis not present

## 2016-07-07 DIAGNOSIS — N189 Chronic kidney disease, unspecified: Secondary | ICD-10-CM | POA: Diagnosis not present

## 2016-07-07 DIAGNOSIS — I129 Hypertensive chronic kidney disease with stage 1 through stage 4 chronic kidney disease, or unspecified chronic kidney disease: Secondary | ICD-10-CM | POA: Diagnosis not present

## 2016-07-07 DIAGNOSIS — M069 Rheumatoid arthritis, unspecified: Secondary | ICD-10-CM | POA: Diagnosis not present

## 2016-07-07 DIAGNOSIS — L97222 Non-pressure chronic ulcer of left calf with fat layer exposed: Secondary | ICD-10-CM | POA: Diagnosis not present

## 2016-07-07 DIAGNOSIS — D649 Anemia, unspecified: Secondary | ICD-10-CM | POA: Diagnosis not present

## 2016-07-07 DIAGNOSIS — G8929 Other chronic pain: Secondary | ICD-10-CM | POA: Diagnosis not present

## 2016-07-07 DIAGNOSIS — I872 Venous insufficiency (chronic) (peripheral): Secondary | ICD-10-CM | POA: Diagnosis not present

## 2016-07-07 DIAGNOSIS — M810 Age-related osteoporosis without current pathological fracture: Secondary | ICD-10-CM | POA: Diagnosis not present

## 2016-07-07 DIAGNOSIS — Z87891 Personal history of nicotine dependence: Secondary | ICD-10-CM | POA: Diagnosis not present

## 2016-07-13 ENCOUNTER — Encounter: Payer: Medicare Other | Admitting: Registered Nurse

## 2016-07-14 DIAGNOSIS — L97222 Non-pressure chronic ulcer of left calf with fat layer exposed: Secondary | ICD-10-CM | POA: Diagnosis not present

## 2016-07-14 DIAGNOSIS — Z8673 Personal history of transient ischemic attack (TIA), and cerebral infarction without residual deficits: Secondary | ICD-10-CM | POA: Diagnosis not present

## 2016-07-14 DIAGNOSIS — K219 Gastro-esophageal reflux disease without esophagitis: Secondary | ICD-10-CM | POA: Diagnosis not present

## 2016-07-14 DIAGNOSIS — I872 Venous insufficiency (chronic) (peripheral): Secondary | ICD-10-CM | POA: Diagnosis not present

## 2016-07-14 DIAGNOSIS — I129 Hypertensive chronic kidney disease with stage 1 through stage 4 chronic kidney disease, or unspecified chronic kidney disease: Secondary | ICD-10-CM | POA: Diagnosis not present

## 2016-07-14 DIAGNOSIS — M1990 Unspecified osteoarthritis, unspecified site: Secondary | ICD-10-CM | POA: Diagnosis not present

## 2016-07-14 DIAGNOSIS — S8012XD Contusion of left lower leg, subsequent encounter: Secondary | ICD-10-CM | POA: Diagnosis not present

## 2016-07-14 DIAGNOSIS — I890 Lymphedema, not elsewhere classified: Secondary | ICD-10-CM | POA: Diagnosis not present

## 2016-07-14 DIAGNOSIS — D649 Anemia, unspecified: Secondary | ICD-10-CM | POA: Diagnosis not present

## 2016-07-14 DIAGNOSIS — G8929 Other chronic pain: Secondary | ICD-10-CM | POA: Diagnosis not present

## 2016-07-14 DIAGNOSIS — E1122 Type 2 diabetes mellitus with diabetic chronic kidney disease: Secondary | ICD-10-CM | POA: Diagnosis not present

## 2016-07-14 DIAGNOSIS — E11622 Type 2 diabetes mellitus with other skin ulcer: Secondary | ICD-10-CM | POA: Diagnosis not present

## 2016-07-14 DIAGNOSIS — I509 Heart failure, unspecified: Secondary | ICD-10-CM | POA: Diagnosis not present

## 2016-07-14 DIAGNOSIS — Z87891 Personal history of nicotine dependence: Secondary | ICD-10-CM | POA: Diagnosis not present

## 2016-07-14 DIAGNOSIS — M810 Age-related osteoporosis without current pathological fracture: Secondary | ICD-10-CM | POA: Diagnosis not present

## 2016-07-14 DIAGNOSIS — N189 Chronic kidney disease, unspecified: Secondary | ICD-10-CM | POA: Diagnosis not present

## 2016-07-16 ENCOUNTER — Encounter: Payer: Self-pay | Admitting: Registered Nurse

## 2016-07-16 ENCOUNTER — Encounter: Payer: Medicare Other | Attending: Physical Medicine and Rehabilitation | Admitting: Registered Nurse

## 2016-07-16 VITALS — BP 131/76 | HR 89

## 2016-07-16 DIAGNOSIS — L98499 Non-pressure chronic ulcer of skin of other sites with unspecified severity: Secondary | ICD-10-CM | POA: Diagnosis not present

## 2016-07-16 DIAGNOSIS — Z89519 Acquired absence of unspecified leg below knee: Secondary | ICD-10-CM | POA: Diagnosis not present

## 2016-07-16 DIAGNOSIS — G546 Phantom limb syndrome with pain: Secondary | ICD-10-CM

## 2016-07-16 DIAGNOSIS — M47816 Spondylosis without myelopathy or radiculopathy, lumbar region: Secondary | ICD-10-CM | POA: Diagnosis not present

## 2016-07-16 DIAGNOSIS — Z5181 Encounter for therapeutic drug level monitoring: Secondary | ICD-10-CM | POA: Diagnosis not present

## 2016-07-16 DIAGNOSIS — M25512 Pain in left shoulder: Secondary | ICD-10-CM

## 2016-07-16 DIAGNOSIS — L03116 Cellulitis of left lower limb: Secondary | ICD-10-CM | POA: Diagnosis not present

## 2016-07-16 DIAGNOSIS — Z79899 Other long term (current) drug therapy: Secondary | ICD-10-CM | POA: Diagnosis not present

## 2016-07-16 DIAGNOSIS — M069 Rheumatoid arthritis, unspecified: Secondary | ICD-10-CM | POA: Diagnosis not present

## 2016-07-16 DIAGNOSIS — M25511 Pain in right shoulder: Secondary | ICD-10-CM | POA: Diagnosis not present

## 2016-07-16 DIAGNOSIS — G894 Chronic pain syndrome: Secondary | ICD-10-CM | POA: Diagnosis not present

## 2016-07-16 DIAGNOSIS — M1712 Unilateral primary osteoarthritis, left knee: Secondary | ICD-10-CM

## 2016-07-16 DIAGNOSIS — M179 Osteoarthritis of knee, unspecified: Secondary | ICD-10-CM | POA: Insufficient documentation

## 2016-07-16 DIAGNOSIS — M4716 Other spondylosis with myelopathy, lumbar region: Secondary | ICD-10-CM | POA: Insufficient documentation

## 2016-07-16 DIAGNOSIS — G609 Hereditary and idiopathic neuropathy, unspecified: Secondary | ICD-10-CM

## 2016-07-16 MED ORDER — OXYCODONE-ACETAMINOPHEN 10-325 MG PO TABS: 1.0000 | ORAL_TABLET | Freq: Four times a day (QID) | ORAL | 0 refills | Status: DC | PRN

## 2016-07-16 MED ORDER — FENTANYL 75 MCG/HR TD PT72: 75.0000 ug | MEDICATED_PATCH | TRANSDERMAL | 0 refills | Status: DC

## 2016-07-16 NOTE — Progress Notes (Signed)
Subjective:    Patient ID: Crystal Fitzpatrick, female    DOB: 02-Aug-1945, 71 y.o.   MRN: [id number]  HPI: Crystal Fitzpatrick is a 71year old female who returns for follow up appointment for chronic pain and medication refill. She states her pain is located in her bilateral shoulders, lower back, right stump pain (phantom) and left lower extremity ( left lower extremity dressing intact. She rates her pain 4. Her current exercise regime is performing stretching exercises and Yoga daily.  Also states she's receiving wound care at [facility] in [facility] weekly. Left lower extremity dressing intact.    Pain Inventory Average Pain 4 Pain Right Now 4 My pain is intermittent and sharp  In the last 24 hours, has pain interfered with the following? General activity 3 Relation with others 2 Enjoyment of life 2 What TIME of day is your pain at its worst? morning Sleep (in general) .  Pain is worse with: inactivity and some activites Pain improves with: heat/ice, pacing activities and medication Relief from Meds: 7  Mobility ability to climb steps?  no do you drive?  no use a wheelchair transfers alone  Function retired  Neuro/Psych bladder control problems weakness numbness trouble walking  Prior Studies Any changes since last visit?  no  Physicians involved in your care Any changes since last visit?  no   Family History  Problem Relation Age of Onset  . Breast cancer Sister   . Diabetes Other   . Stroke Other     Grandparents   Social History   Social History  . Marital status: Divorced    Spouse name: N/A  . Number of children: 1  . Years of education: N/A   Occupational History  . retired Marketing executive Retired   Social History Main Topics  . Smoking status: Former Games developer  . Smokeless tobacco: Never Used  . Alcohol use No  . Drug use: No  . Sexual activity: Not on file   Other Topics Concern  . Not on file   Social History  Narrative  . No narrative on file   Past Surgical History:  Procedure Laterality Date  . AMPUTATION  02/02/2008   below right knee  . foot surgury  1980 and 1981  . NASAL SEPTUM SURGERY    . 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 s/p Breast biopsy  1992  . s/p EGD  2007   with Botox for? Achalasia  . TONSILLECTOMY    . TUBAL LIGATION     Past Medical History:  Diagnosis Date  . ABDOMINAL DISTENSION 10/11/2008  . Acute gouty arthropathy 12/11/2008  . Altered mental status 02/07/2010  . AMPUTATION, BELOW KNEE, RIGHT, HX OF 01/28/2009  . ANAPHYLACTIC SHOCK 05/15/2009  . ANEMIA-NOS 07/17/2008  . ANXIETY 07/17/2008  . ARTHRITIS 01/28/2009  . B12 DEFICIENCY 07/17/2008  . Cellulitis and abscess of leg, except foot 11/15/2008  . CHF 01/28/2009  . Chronic pain syndrome 02/14/2010  . COLONIC POLYPS, HX OF 07/17/2008  . DIABETES MELLITUS, TYPE II 07/17/2008  . Dysuria 11/11/2009  . FOOT PAIN 07/17/2008  . GERD 07/17/2008  . HEARING LOSS, RIGHT EAR 06/04/2009  . HYPERKALEMIA 10/31/2009  . Hyperlipemia 09/24/2010  . Hyperlipidemia 09/24/2010  . HYPERSOMNIA 02/14/2010  . HYPERTENSION 07/17/2008  . HYPONATREMIA 03/20/2010  . Hypoxemia 03/20/2010  . IBS 06/04/2009  . MENOPAUSAL DISORDER 12/03/2009  . NEUROPATHY, HX OF 01/28/2009  . Osteoporosis 11/16/2014  . PERIPHERAL  EDEMA 08/30/2008  . Pneumonia, aspiration (HCC) 12/01/2011   Episode July 2013, tx per [facility]  . Pneumonia, organism unspecified(486) 02/14/2010  . RENAL INSUFFICIENCY 10/31/2009  . Rheumatoid arthritis(714.0) 07/17/2008  . Seizure (HCC) 10/26/2010  . SKIN LESION 06/04/2009  . SKIN ULCER, CHRONIC 02/08/2009  . Spinal stenosis of lumbar region 05/15/2015  . Trigeminal neuralgia 02/14/2010  . UNSPECIFIED HEPATITIS 01/28/2009  . Wheezing 12/03/2009   There were no vitals taken for this visit.  Opioid Risk Score:   Fall Risk Score:  `1  Depression screen PHQ 2/9  Depression screen [facility] 2/9 01/27/2016 12/26/2015 09/17/2015 02/25/2015  02/12/2015 01/15/2015 12/19/2014  Decreased Interest 0 0 0 1 0 0 0  Down, Depressed, Hopeless 0 0 0 1 0 0 0  PHQ - 2 Score 0 0 0 2 0 0 0  Altered sleeping - - - 3 - - -  Tired, decreased energy - - - 3 - - -  Change in appetite - - - - - - -  Feeling bad or failure about yourself  - - - - - - -  Trouble concentrating - - - 0 - - -  Moving slowly or fidgety/restless - - - 0 - - -  Suicidal thoughts - - - 0 - - -  PHQ-9 Score - - - 8 - - -  Difficult doing work/chores - - - Not difficult at all - - -  Some recent data might be hidden    Review of Systems  HENT: Negative.   Eyes: Negative.   Respiratory: Negative.   Cardiovascular: Negative.   Gastrointestinal: Negative.   Endocrine: Negative.   Genitourinary: Negative.   Musculoskeletal: Positive for back pain and gait problem.  Skin: Negative.   Allergic/Immunologic: Negative.   Neurological: Positive for weakness and numbness.  Hematological: Negative.   Psychiatric/Behavioral: Negative.   All other systems reviewed and are negative.      Objective:   Physical Exam  Constitutional: She is oriented to person, place, and time. She appears well-developed and well-nourished.  HENT:  Head: Normocephalic and atraumatic.  Neck: Normal range of motion. Neck supple.  Cardiovascular: Normal rate and regular rhythm.   Pulmonary/Chest: Effort normal and breath sounds normal.  Musculoskeletal:  Normal Muscle Bulk and Muscle Testing Reveals: Upper Extremities: Full ROM and Muscle Strength 5/5 Thoracic Paraspinal Tenderness: T-1-T-3 Lumbar Paraspinal Tenderness: L-3-L-5 Lower Extremities: Right: BKA Left: Full ROM and Muscle Strength 5/5 Left Lower Extremity Flexion Produces Pain into Popliteal Fossa Left wound dressing intact Left Lower Extremity with Pitting Edema Arrived in wheelchair  Neurological: She is alert and oriented to person, place, and time.  Skin: Skin is warm and dry.  Psychiatric: She has a normal mood and affect.   Nursing note and vitals reviewed.         Assessment & Plan:  1.Right below-knee amputation, history of phantom limb pain.: Continue Current Medication Regime and Continue to Monitor. 07/16/2016 2. Peripheral neuropathy: Continue Gabapentin. 07/16/2016 Refilled: Fenatnyl Patch 75 mcg one patch every three days #10  and Continue Oxycodone 10/325mg  one tablet every 6 hours as needed #120.  We will continue the opioid monitoring program, this consists of regular clinic visits, examinations, urine drug screen, pill counts as well as use of [place] Controlled Substance Reporting System. 3. History of rheumatoid arthritis. Continue Current Medication and Exercise and heat Therapy. 07/16/2016 4. Osteoarthritis of left knee: Continue with Voltaren gel/ Exercise and heat therapy.  5. Chronic  cellulitis, left leg with associated edema:PCP Following/ Attending Wound Care weekly and occasionally weekly or Bi-Weekly.. 6. Lumbar Spondylosis: Continue current medication regime. Continue HEP.07/16/2016 7. Bilateral Shoulder Pain: Continue HEP as Tolerated. 07/16/2016  20 minutes of face to face patient care time was spent during this visit. All questions were encouraged and answered.   F/U in 1 month

## 2016-07-17 ENCOUNTER — Telehealth: Payer: Self-pay | Admitting: Registered Nurse

## 2016-07-17 ENCOUNTER — Encounter: Payer: Self-pay | Admitting: Registered Nurse

## 2016-07-17 ENCOUNTER — Other Ambulatory Visit: Payer: Self-pay | Admitting: Physical Medicine & Rehabilitation

## 2016-07-17 NOTE — Telephone Encounter (Signed)
Patient requested a refill on this medication, cymbalta, no mention in previous notes as to continue this medication, please advise

## 2016-07-17 NOTE — Telephone Encounter (Signed)
On 07/17/2016 the NCCSR was reviewed no conflict was seen on the [facility] Controlled Substance Reporting System with multiple prescribers. Crystal Fitzpatrick has a signed narcotic contract with our office. If there were any discrepancies this would have been reported to her physician.

## 2016-07-21 NOTE — Telephone Encounter (Signed)
Refilled Cymbalta: Neuropathic Pain

## 2016-07-22 DIAGNOSIS — R569 Unspecified convulsions: Secondary | ICD-10-CM | POA: Diagnosis not present

## 2016-07-22 DIAGNOSIS — I639 Cerebral infarction, unspecified: Secondary | ICD-10-CM | POA: Diagnosis not present

## 2016-07-22 DIAGNOSIS — E7800 Pure hypercholesterolemia, unspecified: Secondary | ICD-10-CM | POA: Diagnosis not present

## 2016-07-22 LAB — TOXASSURE SELECT,+ANTIDEPR,UR

## 2016-07-23 DIAGNOSIS — Z8673 Personal history of transient ischemic attack (TIA), and cerebral infarction without residual deficits: Secondary | ICD-10-CM | POA: Diagnosis not present

## 2016-07-23 DIAGNOSIS — M1990 Unspecified osteoarthritis, unspecified site: Secondary | ICD-10-CM | POA: Diagnosis not present

## 2016-07-23 DIAGNOSIS — G8929 Other chronic pain: Secondary | ICD-10-CM | POA: Diagnosis not present

## 2016-07-23 DIAGNOSIS — I872 Venous insufficiency (chronic) (peripheral): Secondary | ICD-10-CM | POA: Diagnosis not present

## 2016-07-23 DIAGNOSIS — E1122 Type 2 diabetes mellitus with diabetic chronic kidney disease: Secondary | ICD-10-CM | POA: Diagnosis not present

## 2016-07-23 DIAGNOSIS — E11622 Type 2 diabetes mellitus with other skin ulcer: Secondary | ICD-10-CM | POA: Diagnosis not present

## 2016-07-23 DIAGNOSIS — L97222 Non-pressure chronic ulcer of left calf with fat layer exposed: Secondary | ICD-10-CM | POA: Diagnosis not present

## 2016-07-23 DIAGNOSIS — K219 Gastro-esophageal reflux disease without esophagitis: Secondary | ICD-10-CM | POA: Diagnosis not present

## 2016-07-23 DIAGNOSIS — M810 Age-related osteoporosis without current pathological fracture: Secondary | ICD-10-CM | POA: Diagnosis not present

## 2016-07-23 DIAGNOSIS — Z87891 Personal history of nicotine dependence: Secondary | ICD-10-CM | POA: Diagnosis not present

## 2016-07-23 DIAGNOSIS — L98499 Non-pressure chronic ulcer of skin of other sites with unspecified severity: Secondary | ICD-10-CM | POA: Diagnosis not present

## 2016-07-23 DIAGNOSIS — S8012XD Contusion of left lower leg, subsequent encounter: Secondary | ICD-10-CM | POA: Diagnosis not present

## 2016-07-23 DIAGNOSIS — N189 Chronic kidney disease, unspecified: Secondary | ICD-10-CM | POA: Diagnosis not present

## 2016-07-23 DIAGNOSIS — D649 Anemia, unspecified: Secondary | ICD-10-CM | POA: Diagnosis not present

## 2016-07-23 DIAGNOSIS — I509 Heart failure, unspecified: Secondary | ICD-10-CM | POA: Diagnosis not present

## 2016-07-23 DIAGNOSIS — I890 Lymphedema, not elsewhere classified: Secondary | ICD-10-CM | POA: Diagnosis not present

## 2016-07-23 DIAGNOSIS — M069 Rheumatoid arthritis, unspecified: Secondary | ICD-10-CM | POA: Diagnosis not present

## 2016-07-23 DIAGNOSIS — E7800 Pure hypercholesterolemia, unspecified: Secondary | ICD-10-CM | POA: Diagnosis not present

## 2016-07-23 DIAGNOSIS — I129 Hypertensive chronic kidney disease with stage 1 through stage 4 chronic kidney disease, or unspecified chronic kidney disease: Secondary | ICD-10-CM | POA: Diagnosis not present

## 2016-07-23 DIAGNOSIS — I739 Peripheral vascular disease, unspecified: Secondary | ICD-10-CM | POA: Diagnosis not present

## 2016-07-30 DIAGNOSIS — Z8673 Personal history of transient ischemic attack (TIA), and cerebral infarction without residual deficits: Secondary | ICD-10-CM | POA: Diagnosis not present

## 2016-07-30 DIAGNOSIS — I509 Heart failure, unspecified: Secondary | ICD-10-CM | POA: Diagnosis not present

## 2016-07-30 DIAGNOSIS — D649 Anemia, unspecified: Secondary | ICD-10-CM | POA: Diagnosis not present

## 2016-07-30 DIAGNOSIS — N189 Chronic kidney disease, unspecified: Secondary | ICD-10-CM | POA: Diagnosis not present

## 2016-07-30 DIAGNOSIS — M069 Rheumatoid arthritis, unspecified: Secondary | ICD-10-CM | POA: Diagnosis not present

## 2016-07-30 DIAGNOSIS — I872 Venous insufficiency (chronic) (peripheral): Secondary | ICD-10-CM | POA: Diagnosis not present

## 2016-07-30 DIAGNOSIS — E11622 Type 2 diabetes mellitus with other skin ulcer: Secondary | ICD-10-CM | POA: Diagnosis not present

## 2016-07-30 DIAGNOSIS — L97222 Non-pressure chronic ulcer of left calf with fat layer exposed: Secondary | ICD-10-CM | POA: Diagnosis not present

## 2016-07-30 DIAGNOSIS — M1990 Unspecified osteoarthritis, unspecified site: Secondary | ICD-10-CM | POA: Diagnosis not present

## 2016-07-30 DIAGNOSIS — Z87891 Personal history of nicotine dependence: Secondary | ICD-10-CM | POA: Diagnosis not present

## 2016-07-30 DIAGNOSIS — G8929 Other chronic pain: Secondary | ICD-10-CM | POA: Diagnosis not present

## 2016-07-30 DIAGNOSIS — K219 Gastro-esophageal reflux disease without esophagitis: Secondary | ICD-10-CM | POA: Diagnosis not present

## 2016-07-30 DIAGNOSIS — I129 Hypertensive chronic kidney disease with stage 1 through stage 4 chronic kidney disease, or unspecified chronic kidney disease: Secondary | ICD-10-CM | POA: Diagnosis not present

## 2016-07-30 DIAGNOSIS — M810 Age-related osteoporosis without current pathological fracture: Secondary | ICD-10-CM | POA: Diagnosis not present

## 2016-07-30 DIAGNOSIS — E1122 Type 2 diabetes mellitus with diabetic chronic kidney disease: Secondary | ICD-10-CM | POA: Diagnosis not present

## 2016-07-30 DIAGNOSIS — I890 Lymphedema, not elsewhere classified: Secondary | ICD-10-CM | POA: Diagnosis not present

## 2016-07-30 DIAGNOSIS — S8012XD Contusion of left lower leg, subsequent encounter: Secondary | ICD-10-CM | POA: Diagnosis not present

## 2016-07-30 DIAGNOSIS — E7800 Pure hypercholesterolemia, unspecified: Secondary | ICD-10-CM | POA: Diagnosis not present

## 2016-08-01 IMAGING — DX DG TIBIA/FIBULA 2V*L*
4 series · 4 of 4 positions shown · non-contrast
Comparison: 09/28/2008.

CLINICAL DATA: Left leg trauma.

EXAM:
LEFT TIBIA AND FIBULA - 2 VIEW

[tibia ap (1 of 2)]
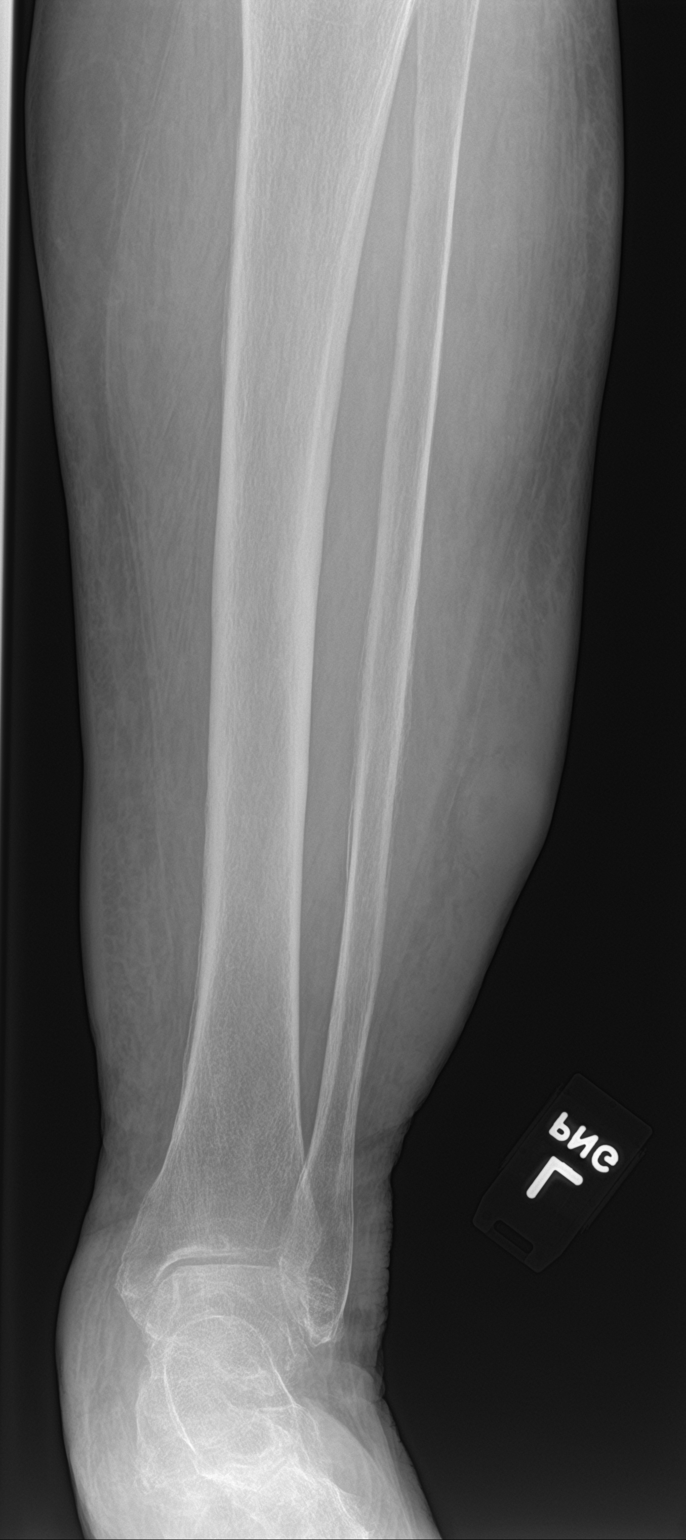

[tibia ap (2 of 2)]
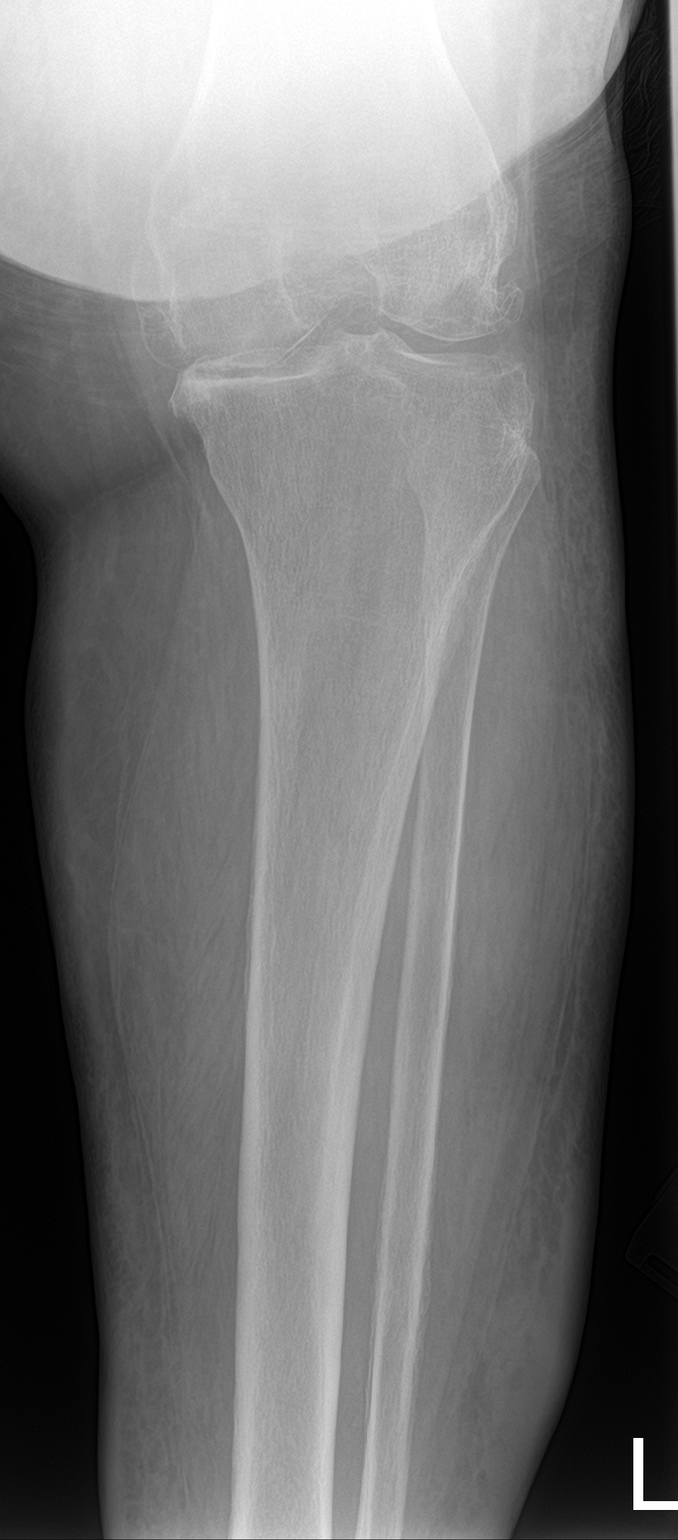

[tibia lat (1 of 2)]
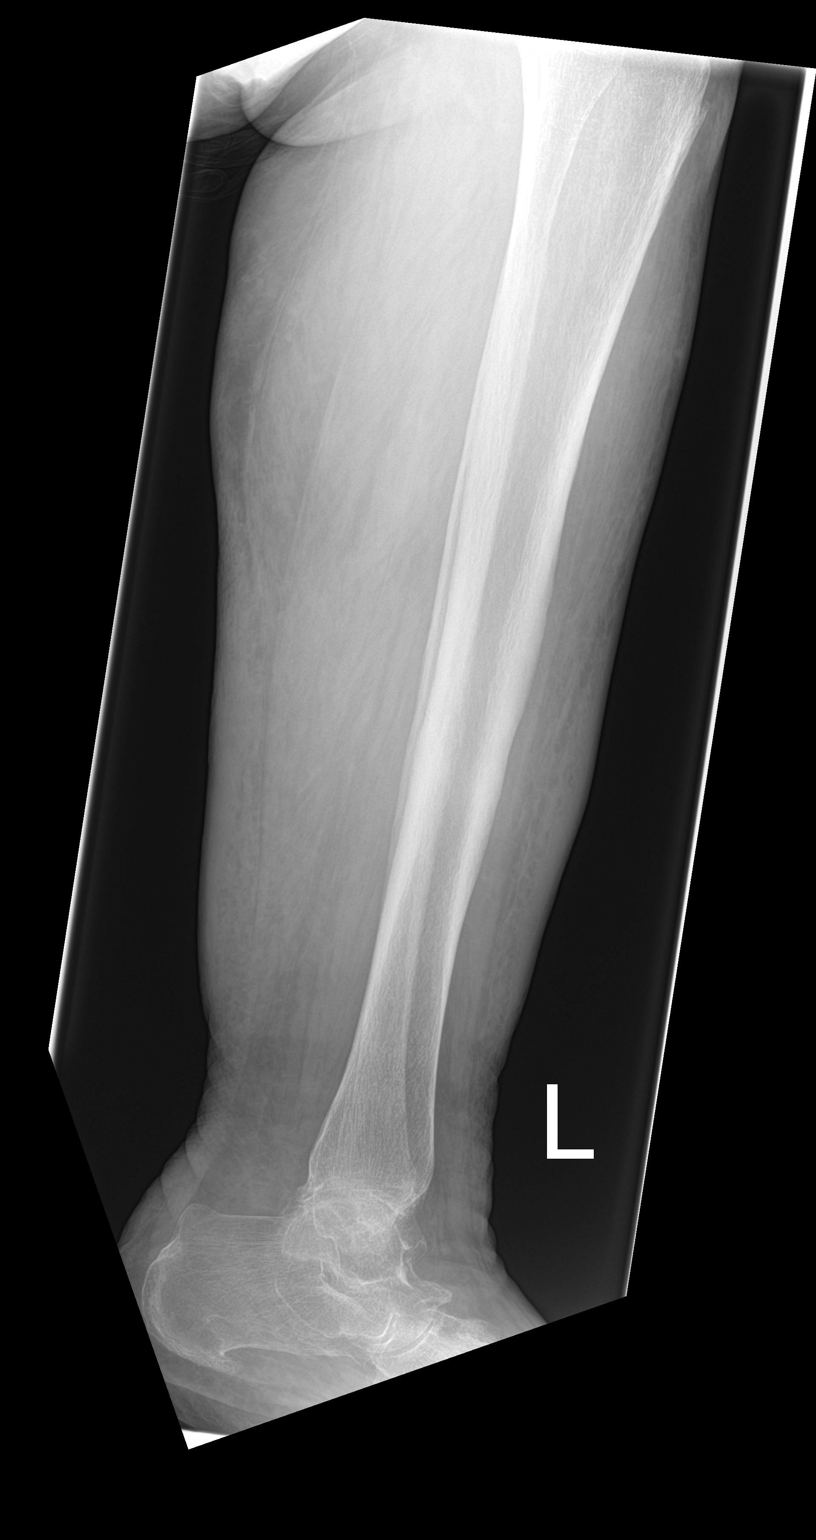

[tibia lat (2 of 2)]
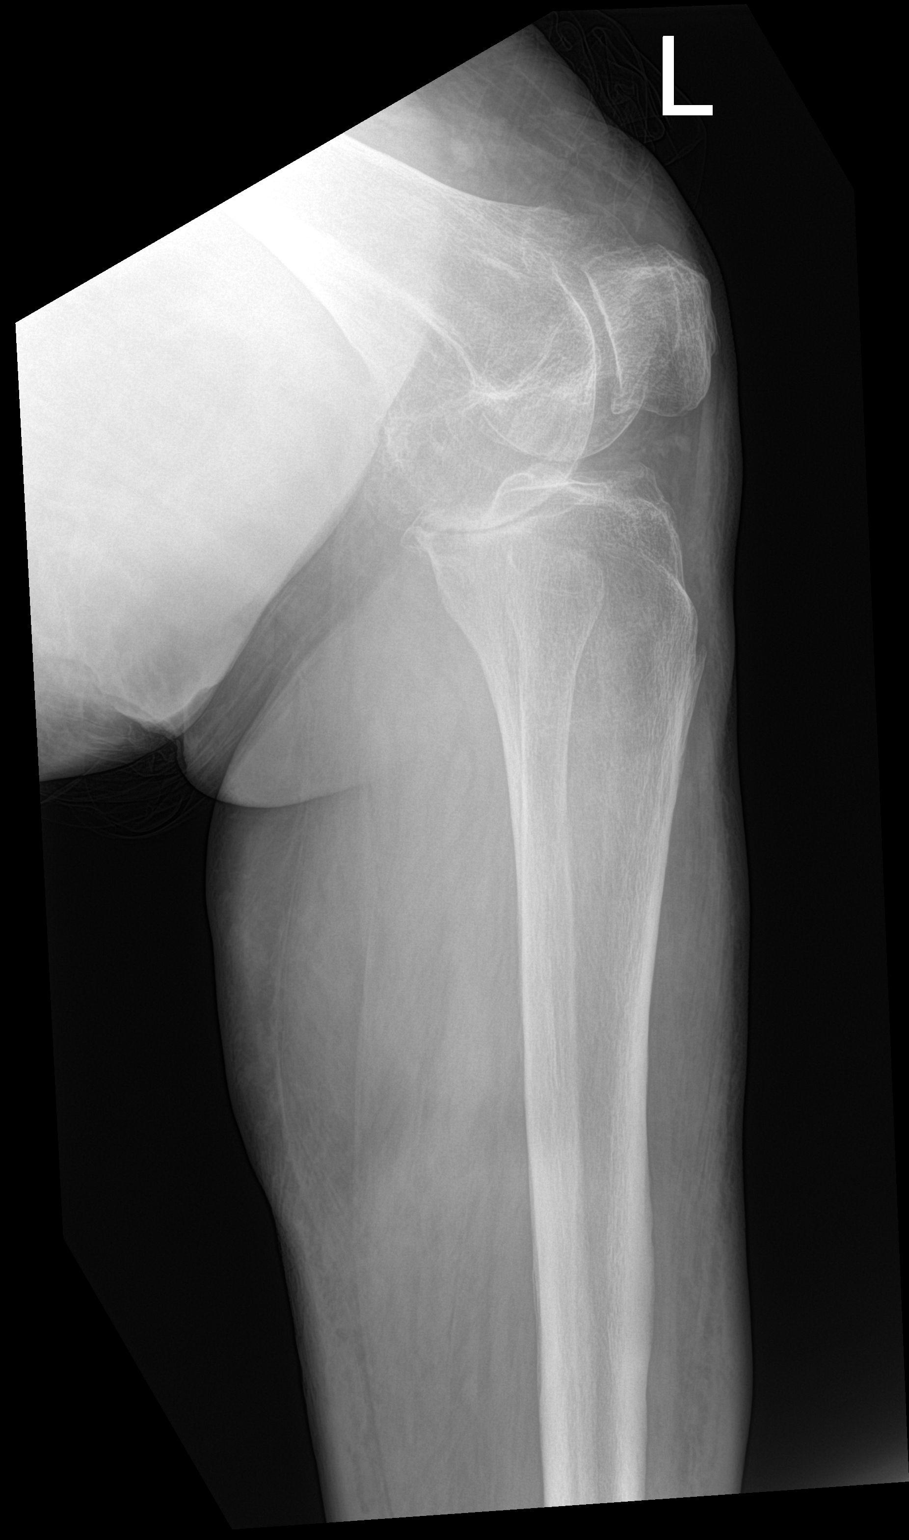

[4 of 4 positions shown; findings below may reference images not displayed]

FINDINGS: Diffuse soft tissue swelling. No evidence of fracture or
dislocation. Diffuse osteopenia. Degenerative changes left knee.
Degenerative changes left ankle.
IMPRESSION: Diffuse soft tissue swelling. No acute bony abnormality.
Degenerative changes left knee and ankle.

## 2016-08-05 DIAGNOSIS — E11622 Type 2 diabetes mellitus with other skin ulcer: Secondary | ICD-10-CM | POA: Diagnosis not present

## 2016-08-05 DIAGNOSIS — I872 Venous insufficiency (chronic) (peripheral): Secondary | ICD-10-CM | POA: Diagnosis not present

## 2016-08-05 DIAGNOSIS — N189 Chronic kidney disease, unspecified: Secondary | ICD-10-CM | POA: Diagnosis not present

## 2016-08-05 DIAGNOSIS — M810 Age-related osteoporosis without current pathological fracture: Secondary | ICD-10-CM | POA: Diagnosis not present

## 2016-08-05 DIAGNOSIS — M069 Rheumatoid arthritis, unspecified: Secondary | ICD-10-CM | POA: Diagnosis not present

## 2016-08-05 DIAGNOSIS — I129 Hypertensive chronic kidney disease with stage 1 through stage 4 chronic kidney disease, or unspecified chronic kidney disease: Secondary | ICD-10-CM | POA: Diagnosis not present

## 2016-08-05 DIAGNOSIS — L97222 Non-pressure chronic ulcer of left calf with fat layer exposed: Secondary | ICD-10-CM | POA: Diagnosis not present

## 2016-08-05 DIAGNOSIS — I890 Lymphedema, not elsewhere classified: Secondary | ICD-10-CM | POA: Diagnosis not present

## 2016-08-05 DIAGNOSIS — E7800 Pure hypercholesterolemia, unspecified: Secondary | ICD-10-CM | POA: Diagnosis not present

## 2016-08-05 DIAGNOSIS — D649 Anemia, unspecified: Secondary | ICD-10-CM | POA: Diagnosis not present

## 2016-08-05 DIAGNOSIS — Z8673 Personal history of transient ischemic attack (TIA), and cerebral infarction without residual deficits: Secondary | ICD-10-CM | POA: Diagnosis not present

## 2016-08-05 DIAGNOSIS — M1990 Unspecified osteoarthritis, unspecified site: Secondary | ICD-10-CM | POA: Diagnosis not present

## 2016-08-05 DIAGNOSIS — I509 Heart failure, unspecified: Secondary | ICD-10-CM | POA: Diagnosis not present

## 2016-08-05 DIAGNOSIS — S8012XD Contusion of left lower leg, subsequent encounter: Secondary | ICD-10-CM | POA: Diagnosis not present

## 2016-08-05 DIAGNOSIS — G8929 Other chronic pain: Secondary | ICD-10-CM | POA: Diagnosis not present

## 2016-08-05 DIAGNOSIS — Z87891 Personal history of nicotine dependence: Secondary | ICD-10-CM | POA: Diagnosis not present

## 2016-08-05 DIAGNOSIS — I739 Peripheral vascular disease, unspecified: Secondary | ICD-10-CM | POA: Diagnosis not present

## 2016-08-05 DIAGNOSIS — K219 Gastro-esophageal reflux disease without esophagitis: Secondary | ICD-10-CM | POA: Diagnosis not present

## 2016-08-12 ENCOUNTER — Encounter (HOSPITAL_BASED_OUTPATIENT_CLINIC_OR_DEPARTMENT_OTHER): Payer: Medicare Other | Admitting: Registered Nurse

## 2016-08-12 ENCOUNTER — Encounter: Payer: Self-pay | Admitting: Registered Nurse

## 2016-08-12 VITALS — BP 136/90 | HR 70 | Resp 14

## 2016-08-12 DIAGNOSIS — G894 Chronic pain syndrome: Secondary | ICD-10-CM

## 2016-08-12 DIAGNOSIS — G546 Phantom limb syndrome with pain: Secondary | ICD-10-CM

## 2016-08-12 DIAGNOSIS — L03116 Cellulitis of left lower limb: Secondary | ICD-10-CM

## 2016-08-12 DIAGNOSIS — M069 Rheumatoid arthritis, unspecified: Secondary | ICD-10-CM | POA: Diagnosis not present

## 2016-08-12 DIAGNOSIS — Z89519 Acquired absence of unspecified leg below knee: Secondary | ICD-10-CM | POA: Diagnosis not present

## 2016-08-12 DIAGNOSIS — M542 Cervicalgia: Secondary | ICD-10-CM

## 2016-08-12 DIAGNOSIS — M179 Osteoarthritis of knee, unspecified: Secondary | ICD-10-CM | POA: Diagnosis not present

## 2016-08-12 DIAGNOSIS — M47816 Spondylosis without myelopathy or radiculopathy, lumbar region: Secondary | ICD-10-CM | POA: Diagnosis not present

## 2016-08-12 DIAGNOSIS — G609 Hereditary and idiopathic neuropathy, unspecified: Secondary | ICD-10-CM

## 2016-08-12 DIAGNOSIS — M7061 Trochanteric bursitis, right hip: Secondary | ICD-10-CM | POA: Diagnosis not present

## 2016-08-12 DIAGNOSIS — Z5181 Encounter for therapeutic drug level monitoring: Secondary | ICD-10-CM

## 2016-08-12 DIAGNOSIS — Z79899 Other long term (current) drug therapy: Secondary | ICD-10-CM | POA: Diagnosis not present

## 2016-08-12 DIAGNOSIS — M4716 Other spondylosis with myelopathy, lumbar region: Secondary | ICD-10-CM | POA: Diagnosis not present

## 2016-08-12 MED ORDER — FENTANYL 75 MCG/HR TD PT72: 75.0000 ug | MEDICATED_PATCH | TRANSDERMAL | 0 refills | Status: DC

## 2016-08-12 MED ORDER — OXYCODONE-ACETAMINOPHEN 10-325 MG PO TABS: 1.0000 | ORAL_TABLET | Freq: Four times a day (QID) | ORAL | 0 refills | Status: DC | PRN

## 2016-08-12 NOTE — Progress Notes (Signed)
Subjective:    Patient ID: Crystal Fitzpatrick, female    DOB: December 08, 1945, 71 y.o.   MRN: [id number]  HPI: Crystal Fitzpatrick is a 71year old female who returns for follow up appointmentfor chronic pain and medication refill. She states her pain is located in her neck, lower back radiating into her right hip, right stump pain (phantom) and left lower extremity ( left lower extremity dressing intact. She rates her pain 6. Her current exercise regime is performing stretching exercises daily.   Also states she's receiving wound care at [facility] weekly. Left lower extremity dressing intact.  She has a new PCP with [facility].  Pain Inventory Average Pain 6 Pain Right Now 6 My pain is intermittent, sharp, burning and aching  In the last 24 hours, has pain interfered with the following? General activity 6 Relation with others 4 Enjoyment of life 4 What TIME of day is your pain at its worst? morning Sleep (in general) Poor  Pain is worse with: inactivity and some activites Pain improves with: medication Relief from Meds: 6  Mobility use a wheelchair transfers alone  Function disabled: date disabled .  Neuro/Psych tremor trouble walking spasms dizziness  Prior Studies Any changes since last visit?  no  Physicians involved in your care Any changes since last visit?  no   Family History  Problem Relation Age of Onset  . Breast cancer Sister   . Diabetes Other   . Stroke Other     Grandparents   Social History   Social History  . Marital status: Divorced    Spouse name: N/A  . Number of children: 1  . Years of education: N/A   Occupational History  . retired Marketing executive Retired   Social History Main Topics  . Smoking status: Former Games developer  . Smokeless tobacco: Never Used  . Alcohol use No  . Drug use: No  . Sexual activity: Not Asked   Other Topics Concern  . None   Social History Narrative  . None    Past Surgical History:  Procedure Laterality Date  . AMPUTATION  02/02/2008   below right knee  . foot surgury  1980 and 1981  . NASAL SEPTUM SURGERY    . 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 s/p Breast biopsy  1992  . s/p EGD  2007   with Botox for? Achalasia  . TONSILLECTOMY    . TUBAL LIGATION     Past Medical History:  Diagnosis Date  . ABDOMINAL DISTENSION 10/11/2008  . Acute gouty arthropathy 12/11/2008  . Altered mental status 02/07/2010  . AMPUTATION, BELOW KNEE, RIGHT, HX OF 01/28/2009  . ANAPHYLACTIC SHOCK 05/15/2009  . ANEMIA-NOS 07/17/2008  . ANXIETY 07/17/2008  . ARTHRITIS 01/28/2009  . B12 DEFICIENCY 07/17/2008  . Cellulitis and abscess of leg, except foot 11/15/2008  . CHF 01/28/2009  . Chronic pain syndrome 02/14/2010  . COLONIC POLYPS, HX OF 07/17/2008  . DIABETES MELLITUS, TYPE II 07/17/2008  . Dysuria 11/11/2009  . FOOT PAIN 07/17/2008  . GERD 07/17/2008  . HEARING LOSS, RIGHT EAR 06/04/2009  . HYPERKALEMIA 10/31/2009  . Hyperlipemia 09/24/2010  . Hyperlipidemia 09/24/2010  . HYPERSOMNIA 02/14/2010  . HYPERTENSION 07/17/2008  . HYPONATREMIA 03/20/2010  . Hypoxemia 03/20/2010  . IBS 06/04/2009  . MENOPAUSAL DISORDER 12/03/2009  . NEUROPATHY, HX OF 01/28/2009  . Osteoporosis 11/16/2014  . PERIPHERAL EDEMA 08/30/2008  . Pneumonia, aspiration (HCC) 12/01/2011  Episode July 2013, tx per [facility]  . Pneumonia, organism unspecified(486) 02/14/2010  . RENAL INSUFFICIENCY 10/31/2009  . Rheumatoid arthritis(714.0) 07/17/2008  . Seizure (HCC) 10/26/2010  . SKIN LESION 06/04/2009  . SKIN ULCER, CHRONIC 02/08/2009  . Spinal stenosis of lumbar region 05/15/2015  . Trigeminal neuralgia 02/14/2010  . UNSPECIFIED HEPATITIS 01/28/2009  . Wheezing 12/03/2009   BP 136/90   Pulse 70   Resp 14   SpO2 93%   Opioid Risk Score:   Fall Risk Score:  `1  Depression screen PHQ 2/9  Depression screen [facility] 2/9 01/27/2016 12/26/2015 09/17/2015 02/25/2015 02/12/2015 01/15/2015 12/19/2014   Decreased Interest 0 0 0 1 0 0 0  Down, Depressed, Hopeless 0 0 0 1 0 0 0  PHQ - 2 Score 0 0 0 2 0 0 0  Altered sleeping - - - 3 - - -  Tired, decreased energy - - - 3 - - -  Change in appetite - - - - - - -  Feeling bad or failure about yourself  - - - - - - -  Trouble concentrating - - - 0 - - -  Moving slowly or fidgety/restless - - - 0 - - -  Suicidal thoughts - - - 0 - - -  PHQ-9 Score - - - 8 - - -  Difficult doing work/chores - - - Not difficult at all - - -  Some recent data might be hidden     Review of Systems  Constitutional: Negative.   HENT: Negative.   Eyes: Negative.   Respiratory: Negative.   Cardiovascular: Negative.   Gastrointestinal: Negative.   Endocrine: Negative.   Genitourinary: Negative.   Musculoskeletal: Positive for arthralgias and back pain.  Skin: Negative.   Allergic/Immunologic: Negative.   Neurological: Negative.   Hematological: Negative.   Psychiatric/Behavioral: Negative.   All other systems reviewed and are negative.      Objective:   Physical Exam  Constitutional: She is oriented to person, place, and time. She appears well-developed and well-nourished.  HENT:  Head: Normocephalic and atraumatic.  Neck: Normal range of motion. Neck supple.  Cardiovascular: Normal rate and regular rhythm.   Pulmonary/Chest: Effort normal and breath sounds normal.  Musculoskeletal:  Normal Muscle Bulk and Muscle Testing Reveals:  Upper Extremities: Full ROM and Muscle Strength 5/5 Back without spinal tenderness noted Lower Extremities: Right: BKA Left: Full ROM and muscle Strength 5/5 Left Lower Extremity Flexion Produces Pain into Patella Arrived in wheelchair  Neurological: She is alert and oriented to person, place, and time.  Skin: Skin is warm and dry.  Psychiatric: She has a normal mood and affect.  Nursing note and vitals reviewed.         Assessment & Plan:  1.Right below-knee amputation, history of phantom limb pain.: Continue  Current Medication Regime and Continue to Monitor. 03/028/2018 2. Peripheral neuropathy: Continue Gabapentin and Cymbalta. 03/028/2018 Refilled: Fenatnyl Patch 75 mcg one patch every three days #10  and Continue Oxycodone 10/325mg  one tablet every 6 hours as needed #120.  We will continue the opioid monitoring program, this consists of regular clinic visits, examinations, urine drug screen, pill counts as well as use of [place] Controlled Substance Reporting System. 3. History of rheumatoid arthritis. Continue Current Medication and Exercise and heat Therapy. 08/12/2016 4. Osteoarthritis of left knee: Continue with Voltaren gel/ Exercise and heat therapy. 08/12/2016 5. Chronic cellulitis, left leg with associated edema:PCP Following/ Attending Wound Care weekly and occasionally  weekly or Bi-Weekly. 08/12/2016 6. Lumbar Spondylosis: Continue current medication regime. Continue HEP.08/12/2016 7. Bilateral Shoulder Pain: Continue HEP as Tolerated. No complaints Today. 08/12/2016 8. Neck Pain: Continue HEP and Continue to Monitor. 08/12/2016 9. Muscle Spasm: Continue Flexeril. 08/12/2016  20 minutes of face to face patient care time was spent during this visit. All questions were encouraged and answered.   F/U in 1 month

## 2016-08-13 DIAGNOSIS — I872 Venous insufficiency (chronic) (peripheral): Secondary | ICD-10-CM | POA: Diagnosis not present

## 2016-08-13 DIAGNOSIS — Z87891 Personal history of nicotine dependence: Secondary | ICD-10-CM | POA: Diagnosis not present

## 2016-08-13 DIAGNOSIS — L97222 Non-pressure chronic ulcer of left calf with fat layer exposed: Secondary | ICD-10-CM | POA: Diagnosis not present

## 2016-08-13 DIAGNOSIS — S8012XD Contusion of left lower leg, subsequent encounter: Secondary | ICD-10-CM | POA: Diagnosis not present

## 2016-08-13 DIAGNOSIS — I129 Hypertensive chronic kidney disease with stage 1 through stage 4 chronic kidney disease, or unspecified chronic kidney disease: Secondary | ICD-10-CM | POA: Diagnosis not present

## 2016-08-13 DIAGNOSIS — N189 Chronic kidney disease, unspecified: Secondary | ICD-10-CM | POA: Diagnosis not present

## 2016-08-13 DIAGNOSIS — I509 Heart failure, unspecified: Secondary | ICD-10-CM | POA: Diagnosis not present

## 2016-08-13 DIAGNOSIS — M810 Age-related osteoporosis without current pathological fracture: Secondary | ICD-10-CM | POA: Diagnosis not present

## 2016-08-13 DIAGNOSIS — E7800 Pure hypercholesterolemia, unspecified: Secondary | ICD-10-CM | POA: Diagnosis not present

## 2016-08-13 DIAGNOSIS — I890 Lymphedema, not elsewhere classified: Secondary | ICD-10-CM | POA: Diagnosis not present

## 2016-08-13 DIAGNOSIS — M069 Rheumatoid arthritis, unspecified: Secondary | ICD-10-CM | POA: Diagnosis not present

## 2016-08-13 DIAGNOSIS — E1122 Type 2 diabetes mellitus with diabetic chronic kidney disease: Secondary | ICD-10-CM | POA: Diagnosis not present

## 2016-08-13 DIAGNOSIS — Z8673 Personal history of transient ischemic attack (TIA), and cerebral infarction without residual deficits: Secondary | ICD-10-CM | POA: Diagnosis not present

## 2016-08-13 DIAGNOSIS — K219 Gastro-esophageal reflux disease without esophagitis: Secondary | ICD-10-CM | POA: Diagnosis not present

## 2016-08-13 DIAGNOSIS — M1990 Unspecified osteoarthritis, unspecified site: Secondary | ICD-10-CM | POA: Diagnosis not present

## 2016-08-13 DIAGNOSIS — E11622 Type 2 diabetes mellitus with other skin ulcer: Secondary | ICD-10-CM | POA: Diagnosis not present

## 2016-08-19 DIAGNOSIS — G8929 Other chronic pain: Secondary | ICD-10-CM | POA: Diagnosis not present

## 2016-08-19 DIAGNOSIS — M810 Age-related osteoporosis without current pathological fracture: Secondary | ICD-10-CM | POA: Diagnosis not present

## 2016-08-19 DIAGNOSIS — I509 Heart failure, unspecified: Secondary | ICD-10-CM | POA: Diagnosis not present

## 2016-08-19 DIAGNOSIS — L97222 Non-pressure chronic ulcer of left calf with fat layer exposed: Secondary | ICD-10-CM | POA: Diagnosis not present

## 2016-08-19 DIAGNOSIS — D649 Anemia, unspecified: Secondary | ICD-10-CM | POA: Diagnosis not present

## 2016-08-19 DIAGNOSIS — S8012XD Contusion of left lower leg, subsequent encounter: Secondary | ICD-10-CM | POA: Diagnosis not present

## 2016-08-19 DIAGNOSIS — E11622 Type 2 diabetes mellitus with other skin ulcer: Secondary | ICD-10-CM | POA: Diagnosis not present

## 2016-08-19 DIAGNOSIS — I890 Lymphedema, not elsewhere classified: Secondary | ICD-10-CM | POA: Diagnosis not present

## 2016-08-19 DIAGNOSIS — I872 Venous insufficiency (chronic) (peripheral): Secondary | ICD-10-CM | POA: Diagnosis not present

## 2016-08-19 DIAGNOSIS — M069 Rheumatoid arthritis, unspecified: Secondary | ICD-10-CM | POA: Diagnosis not present

## 2016-08-19 DIAGNOSIS — K219 Gastro-esophageal reflux disease without esophagitis: Secondary | ICD-10-CM | POA: Diagnosis not present

## 2016-08-19 DIAGNOSIS — N189 Chronic kidney disease, unspecified: Secondary | ICD-10-CM | POA: Diagnosis not present

## 2016-08-19 DIAGNOSIS — I129 Hypertensive chronic kidney disease with stage 1 through stage 4 chronic kidney disease, or unspecified chronic kidney disease: Secondary | ICD-10-CM | POA: Diagnosis not present

## 2016-08-19 DIAGNOSIS — Z87891 Personal history of nicotine dependence: Secondary | ICD-10-CM | POA: Diagnosis not present

## 2016-08-19 DIAGNOSIS — M1990 Unspecified osteoarthritis, unspecified site: Secondary | ICD-10-CM | POA: Diagnosis not present

## 2016-08-19 DIAGNOSIS — E1122 Type 2 diabetes mellitus with diabetic chronic kidney disease: Secondary | ICD-10-CM | POA: Diagnosis not present

## 2016-08-26 DIAGNOSIS — L98499 Non-pressure chronic ulcer of skin of other sites with unspecified severity: Secondary | ICD-10-CM | POA: Diagnosis not present

## 2016-08-26 DIAGNOSIS — L97222 Non-pressure chronic ulcer of left calf with fat layer exposed: Secondary | ICD-10-CM | POA: Diagnosis not present

## 2016-08-26 DIAGNOSIS — N189 Chronic kidney disease, unspecified: Secondary | ICD-10-CM | POA: Diagnosis not present

## 2016-08-26 DIAGNOSIS — G8929 Other chronic pain: Secondary | ICD-10-CM | POA: Diagnosis not present

## 2016-08-26 DIAGNOSIS — E1122 Type 2 diabetes mellitus with diabetic chronic kidney disease: Secondary | ICD-10-CM | POA: Diagnosis not present

## 2016-08-26 DIAGNOSIS — E11622 Type 2 diabetes mellitus with other skin ulcer: Secondary | ICD-10-CM | POA: Diagnosis not present

## 2016-08-26 DIAGNOSIS — S8012XD Contusion of left lower leg, subsequent encounter: Secondary | ICD-10-CM | POA: Diagnosis not present

## 2016-08-26 DIAGNOSIS — Z8673 Personal history of transient ischemic attack (TIA), and cerebral infarction without residual deficits: Secondary | ICD-10-CM | POA: Diagnosis not present

## 2016-08-26 DIAGNOSIS — I890 Lymphedema, not elsewhere classified: Secondary | ICD-10-CM | POA: Diagnosis not present

## 2016-08-26 DIAGNOSIS — D649 Anemia, unspecified: Secondary | ICD-10-CM | POA: Diagnosis not present

## 2016-08-26 DIAGNOSIS — Z87891 Personal history of nicotine dependence: Secondary | ICD-10-CM | POA: Diagnosis not present

## 2016-08-26 DIAGNOSIS — M069 Rheumatoid arthritis, unspecified: Secondary | ICD-10-CM | POA: Diagnosis not present

## 2016-08-26 DIAGNOSIS — I509 Heart failure, unspecified: Secondary | ICD-10-CM | POA: Diagnosis not present

## 2016-08-26 DIAGNOSIS — E7800 Pure hypercholesterolemia, unspecified: Secondary | ICD-10-CM | POA: Diagnosis not present

## 2016-08-26 DIAGNOSIS — K219 Gastro-esophageal reflux disease without esophagitis: Secondary | ICD-10-CM | POA: Diagnosis not present

## 2016-08-26 DIAGNOSIS — I129 Hypertensive chronic kidney disease with stage 1 through stage 4 chronic kidney disease, or unspecified chronic kidney disease: Secondary | ICD-10-CM | POA: Diagnosis not present

## 2016-08-26 DIAGNOSIS — M1990 Unspecified osteoarthritis, unspecified site: Secondary | ICD-10-CM | POA: Diagnosis not present

## 2016-08-26 DIAGNOSIS — I872 Venous insufficiency (chronic) (peripheral): Secondary | ICD-10-CM | POA: Diagnosis not present

## 2016-09-03 DIAGNOSIS — E11622 Type 2 diabetes mellitus with other skin ulcer: Secondary | ICD-10-CM | POA: Diagnosis not present

## 2016-09-03 DIAGNOSIS — I129 Hypertensive chronic kidney disease with stage 1 through stage 4 chronic kidney disease, or unspecified chronic kidney disease: Secondary | ICD-10-CM | POA: Diagnosis not present

## 2016-09-03 DIAGNOSIS — I509 Heart failure, unspecified: Secondary | ICD-10-CM | POA: Diagnosis not present

## 2016-09-03 DIAGNOSIS — N189 Chronic kidney disease, unspecified: Secondary | ICD-10-CM | POA: Diagnosis not present

## 2016-09-03 DIAGNOSIS — E7800 Pure hypercholesterolemia, unspecified: Secondary | ICD-10-CM | POA: Diagnosis not present

## 2016-09-03 DIAGNOSIS — M810 Age-related osteoporosis without current pathological fracture: Secondary | ICD-10-CM | POA: Diagnosis not present

## 2016-09-03 DIAGNOSIS — L97222 Non-pressure chronic ulcer of left calf with fat layer exposed: Secondary | ICD-10-CM | POA: Diagnosis not present

## 2016-09-03 DIAGNOSIS — Z87891 Personal history of nicotine dependence: Secondary | ICD-10-CM | POA: Diagnosis not present

## 2016-09-03 DIAGNOSIS — D649 Anemia, unspecified: Secondary | ICD-10-CM | POA: Diagnosis not present

## 2016-09-03 DIAGNOSIS — M1990 Unspecified osteoarthritis, unspecified site: Secondary | ICD-10-CM | POA: Diagnosis not present

## 2016-09-03 DIAGNOSIS — Z8673 Personal history of transient ischemic attack (TIA), and cerebral infarction without residual deficits: Secondary | ICD-10-CM | POA: Diagnosis not present

## 2016-09-03 DIAGNOSIS — I872 Venous insufficiency (chronic) (peripheral): Secondary | ICD-10-CM | POA: Diagnosis not present

## 2016-09-03 DIAGNOSIS — I890 Lymphedema, not elsewhere classified: Secondary | ICD-10-CM | POA: Diagnosis not present

## 2016-09-03 DIAGNOSIS — M069 Rheumatoid arthritis, unspecified: Secondary | ICD-10-CM | POA: Diagnosis not present

## 2016-09-03 DIAGNOSIS — S8012XD Contusion of left lower leg, subsequent encounter: Secondary | ICD-10-CM | POA: Diagnosis not present

## 2016-09-03 DIAGNOSIS — E1122 Type 2 diabetes mellitus with diabetic chronic kidney disease: Secondary | ICD-10-CM | POA: Diagnosis not present

## 2016-09-03 DIAGNOSIS — R300 Dysuria: Secondary | ICD-10-CM | POA: Diagnosis not present

## 2016-09-04 DIAGNOSIS — R300 Dysuria: Secondary | ICD-10-CM | POA: Diagnosis not present

## 2016-09-07 ENCOUNTER — Encounter: Payer: Self-pay | Admitting: Physical Medicine & Rehabilitation

## 2016-09-07 ENCOUNTER — Encounter: Payer: Medicare Other | Attending: Physical Medicine and Rehabilitation | Admitting: Physical Medicine & Rehabilitation

## 2016-09-07 VITALS — BP 114/71 | HR 68 | Resp 14

## 2016-09-07 DIAGNOSIS — Z89519 Acquired absence of unspecified leg below knee: Secondary | ICD-10-CM | POA: Diagnosis not present

## 2016-09-07 DIAGNOSIS — G609 Hereditary and idiopathic neuropathy, unspecified: Secondary | ICD-10-CM | POA: Diagnosis not present

## 2016-09-07 DIAGNOSIS — M1712 Unilateral primary osteoarthritis, left knee: Secondary | ICD-10-CM

## 2016-09-07 DIAGNOSIS — G546 Phantom limb syndrome with pain: Secondary | ICD-10-CM | POA: Diagnosis not present

## 2016-09-07 DIAGNOSIS — M47816 Spondylosis without myelopathy or radiculopathy, lumbar region: Secondary | ICD-10-CM

## 2016-09-07 DIAGNOSIS — M069 Rheumatoid arthritis, unspecified: Secondary | ICD-10-CM | POA: Insufficient documentation

## 2016-09-07 DIAGNOSIS — M179 Osteoarthritis of knee, unspecified: Secondary | ICD-10-CM | POA: Insufficient documentation

## 2016-09-07 DIAGNOSIS — M4716 Other spondylosis with myelopathy, lumbar region: Secondary | ICD-10-CM | POA: Insufficient documentation

## 2016-09-07 MED ORDER — FENTANYL 75 MCG/HR TD PT72: 75.0000 ug | MEDICATED_PATCH | TRANSDERMAL | 0 refills | Status: DC

## 2016-09-07 MED ORDER — OXYCODONE-ACETAMINOPHEN 10-325 MG PO TABS: 1.0000 | ORAL_TABLET | Freq: Four times a day (QID) | ORAL | 0 refills | Status: DC | PRN

## 2016-09-07 NOTE — Progress Notes (Signed)
Subjective:    Patient ID: Crystal Fitzpatrick, female    DOB: June 09, 1945, 71 y.o.   MRN: [id number]  HPI   Crystal Fitzpatrick is here in follow up of her mobility issues ande chronic pain. She is liking her new wheelchair. She has been much more mobile and stable with the chair. Her pain levels have been fairly stable. She hasn't had a fall for many months. Her left leg continues to have wound care issues and is followed by [facility]. She is wearing a unna dressing currently on the LLE.  It sounds as if they are using enzymatic and mechanical debridement on the wound.   For pain she remains on fentanyl patch 1mcg/hr and percocet 10/325 q6 hr prn which has helped keep her pain under reasonable control.   Otherwise her medical status has been fairly stable. She is seeing a new PCP, Dr. Tresa Endo   Pain Inventory Average Pain 6 Pain Right Now 6 My pain is sharp, burning and aching  In the last 24 hours, has pain interfered with the following? General activity 6 Relation with others 4 Enjoyment of life 4 What TIME of day is your pain at its worst? morning Sleep (in general) Poor  Pain is worse with: inactivity and some activites Pain improves with: medication Relief from Meds: 6  Mobility use a wheelchair transfers alone  Function disabled: date disabled .  Neuro/Psych tremor trouble walking spasms dizziness  Prior Studies Any changes since last visit?  no  Physicians involved in your care Any changes since last visit?  no   Family History  Problem Relation Age of Onset  . Breast cancer Sister   . Diabetes Other   . Stroke Other     Grandparents   Social History   Social History  . Marital status: Divorced    Spouse name: N/A  . Number of children: 1  . Years of education: N/A   Occupational History  . retired Marketing executive Retired   Social History Main Topics  . Smoking status: Former Games developer  . Smokeless tobacco: Never Used  . Alcohol use  No  . Drug use: No  . Sexual activity: Not Asked   Other Topics Concern  . None   Social History Narrative  . None   Past Surgical History:  Procedure Laterality Date  . AMPUTATION  02/02/2008   below right knee  . foot surgury  1980 and 1981  . NASAL SEPTUM SURGERY    . 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 s/p Breast biopsy  1992  . s/p EGD  2007   with Botox for? Achalasia  . TONSILLECTOMY    . TUBAL LIGATION     Past Medical History:  Diagnosis Date  . ABDOMINAL DISTENSION 10/11/2008  . Acute gouty arthropathy 12/11/2008  . Altered mental status 02/07/2010  . AMPUTATION, BELOW KNEE, RIGHT, HX OF 01/28/2009  . ANAPHYLACTIC SHOCK 05/15/2009  . ANEMIA-NOS 07/17/2008  . ANXIETY 07/17/2008  . ARTHRITIS 01/28/2009  . B12 DEFICIENCY 07/17/2008  . Cellulitis and abscess of leg, except foot 11/15/2008  . CHF 01/28/2009  . Chronic pain syndrome 02/14/2010  . COLONIC POLYPS, HX OF 07/17/2008  . DIABETES MELLITUS, TYPE II 07/17/2008  . Dysuria 11/11/2009  . FOOT PAIN 07/17/2008  . GERD 07/17/2008  . HEARING LOSS, RIGHT EAR 06/04/2009  . HYPERKALEMIA 10/31/2009  . Hyperlipemia 09/24/2010  . Hyperlipidemia 09/24/2010  . HYPERSOMNIA 02/14/2010  . HYPERTENSION 07/17/2008  .  HYPONATREMIA 03/20/2010  . Hypoxemia 03/20/2010  . IBS 06/04/2009  . MENOPAUSAL DISORDER 12/03/2009  . NEUROPATHY, HX OF 01/28/2009  . Osteoporosis 11/16/2014  . PERIPHERAL EDEMA 08/30/2008  . Pneumonia, aspiration (HCC) 12/01/2011   Episode July 2013, tx per [facility]  . Pneumonia, organism unspecified(486) 02/14/2010  . RENAL INSUFFICIENCY 10/31/2009  . Rheumatoid arthritis(714.0) 07/17/2008  . Seizure (HCC) 10/26/2010  . SKIN LESION 06/04/2009  . SKIN ULCER, CHRONIC 02/08/2009  . Spinal stenosis of lumbar region 05/15/2015  . Trigeminal neuralgia 02/14/2010  . UNSPECIFIED HEPATITIS 01/28/2009  . Wheezing 12/03/2009   BP 114/71 (BP Location: Right Arm, Patient Position: Sitting, Cuff Size: Large)   Pulse 68   Resp  14   SpO2 92%   Opioid Risk Score:   Fall Risk Score:  `1  Depression screen PHQ 2/9  Depression screen [facility] 2/9 01/27/2016 12/26/2015 09/17/2015 02/25/2015 02/12/2015 01/15/2015 12/19/2014  Decreased Interest 0 0 0 1 0 0 0  Down, Depressed, Hopeless 0 0 0 1 0 0 0  PHQ - 2 Score 0 0 0 2 0 0 0  Altered sleeping - - - 3 - - -  Tired, decreased energy - - - 3 - - -  Change in appetite - - - - - - -  Feeling bad or failure about yourself  - - - - - - -  Trouble concentrating - - - 0 - - -  Moving slowly or fidgety/restless - - - 0 - - -  Suicidal thoughts - - - 0 - - -  PHQ-9 Score - - - 8 - - -  Difficult doing work/chores - - - Not difficult at all - - -  Some recent data might be hidden   Review of Systems  Constitutional: Negative.   HENT: Negative.   Eyes: Negative.   Respiratory: Negative.   Cardiovascular: Negative.   Gastrointestinal: Negative.   Endocrine: Negative.   Genitourinary: Negative.   Musculoskeletal: Positive for back pain.  Skin: Negative.   Allergic/Immunologic: Negative.   Neurological:       Neuropathic pain  Hematological: Negative.   Psychiatric/Behavioral: Negative.   All other systems reviewed and are negative.      Objective:   Physical Exam  Constitutional: She appears well-developed and well-nourished. She has lost weight  HENT: some facial tenderness and hypertensitivity  Head: Normocephalic and atraumatic.  Nose: Nose normal.  Mouth/Throat: Oropharynx is clear and moist.  Eyes: EOMI  Neck: Normal range of motion. Neck supple.  Cardiovascular: RRR  Pulmonary/Chest: CTA B  Musculoskeletal: minimal low back tenderness Edema minimal RLE, LLE in unna dressing.  Neurological: A sensory deficitleft foot/leg, right leg to PP and LT.   UE's: 4/5 deltoids, biceps,triceps 4/5. LLE: 4/5 HF, 3+KE 2/5 ADF/PF. Cognitively she's intact   Psychiatric: Normal mood.  W/c is fitting nicely        Assessment & Plan:  1.Right below-knee amputation,  history of phantom limb pain.: Continue Current Medication Regime as below  -might benefit from a powered scooter to use outside the house 2. Peripheral neuropathy: Continue Gabapentin. will give her an extra 15 tabs per month to help with breakthrough neuropathic pain. Refilled: Fentanyl Patch one patch every three days #10. and Oxycodone 10/325mg  one tablet every 6 hours as needed #120. [name]              -We will continue the opioid monitoring program, this consists of regular clinic visits, examinations, urine drug  screen, pill counts as well as use of [place] Controlled Substance Reporting System. NCCSRS was reviewed today. . 3. History of rheumatoid arthritis. Continue Current Medication and Exercise and heat Therapy.  4. Osteoarthritis of left knee: Continue with Voltaren gel/ Exercise and heat therapy.  5. Chronic cellulitis, left leg with associated edema:  -continue [facility] mgt 6. Lumbar Spondylosis: continue HEP.  7. Left leg wound: Wound Care Following also---new chair/cushion should help  20 minutes of face to face patient care time was spent during this visit. All questions were encouraged and answered.

## 2016-09-07 NOTE — Patient Instructions (Signed)
PLEASE FEEL FREE TO CALL OUR OFFICE WITH ANY PROBLEMS OR QUESTIONS ([phone])      

## 2016-09-09 DIAGNOSIS — N390 Urinary tract infection, site not specified: Secondary | ICD-10-CM | POA: Diagnosis not present

## 2016-09-09 DIAGNOSIS — M1990 Unspecified osteoarthritis, unspecified site: Secondary | ICD-10-CM | POA: Diagnosis not present

## 2016-09-09 DIAGNOSIS — I890 Lymphedema, not elsewhere classified: Secondary | ICD-10-CM | POA: Diagnosis not present

## 2016-09-09 DIAGNOSIS — N189 Chronic kidney disease, unspecified: Secondary | ICD-10-CM | POA: Diagnosis not present

## 2016-09-09 DIAGNOSIS — I509 Heart failure, unspecified: Secondary | ICD-10-CM | POA: Diagnosis not present

## 2016-09-09 DIAGNOSIS — M069 Rheumatoid arthritis, unspecified: Secondary | ICD-10-CM | POA: Diagnosis not present

## 2016-09-09 DIAGNOSIS — E7800 Pure hypercholesterolemia, unspecified: Secondary | ICD-10-CM | POA: Diagnosis not present

## 2016-09-09 DIAGNOSIS — L97222 Non-pressure chronic ulcer of left calf with fat layer exposed: Secondary | ICD-10-CM | POA: Diagnosis not present

## 2016-09-09 DIAGNOSIS — Z8673 Personal history of transient ischemic attack (TIA), and cerebral infarction without residual deficits: Secondary | ICD-10-CM | POA: Diagnosis not present

## 2016-09-09 DIAGNOSIS — G8929 Other chronic pain: Secondary | ICD-10-CM | POA: Diagnosis not present

## 2016-09-09 DIAGNOSIS — E11622 Type 2 diabetes mellitus with other skin ulcer: Secondary | ICD-10-CM | POA: Diagnosis not present

## 2016-09-09 DIAGNOSIS — I872 Venous insufficiency (chronic) (peripheral): Secondary | ICD-10-CM | POA: Diagnosis not present

## 2016-09-09 DIAGNOSIS — M810 Age-related osteoporosis without current pathological fracture: Secondary | ICD-10-CM | POA: Diagnosis not present

## 2016-09-09 DIAGNOSIS — S8012XD Contusion of left lower leg, subsequent encounter: Secondary | ICD-10-CM | POA: Diagnosis not present

## 2016-09-09 DIAGNOSIS — Z87891 Personal history of nicotine dependence: Secondary | ICD-10-CM | POA: Diagnosis not present

## 2016-09-09 DIAGNOSIS — K219 Gastro-esophageal reflux disease without esophagitis: Secondary | ICD-10-CM | POA: Diagnosis not present

## 2016-09-09 DIAGNOSIS — I129 Hypertensive chronic kidney disease with stage 1 through stage 4 chronic kidney disease, or unspecified chronic kidney disease: Secondary | ICD-10-CM | POA: Diagnosis not present

## 2016-09-09 DIAGNOSIS — E1122 Type 2 diabetes mellitus with diabetic chronic kidney disease: Secondary | ICD-10-CM | POA: Diagnosis not present

## 2016-09-09 DIAGNOSIS — D649 Anemia, unspecified: Secondary | ICD-10-CM | POA: Diagnosis not present

## 2016-09-16 DIAGNOSIS — L97222 Non-pressure chronic ulcer of left calf with fat layer exposed: Secondary | ICD-10-CM | POA: Diagnosis not present

## 2016-09-16 DIAGNOSIS — E7800 Pure hypercholesterolemia, unspecified: Secondary | ICD-10-CM | POA: Diagnosis not present

## 2016-09-16 DIAGNOSIS — L98499 Non-pressure chronic ulcer of skin of other sites with unspecified severity: Secondary | ICD-10-CM | POA: Diagnosis not present

## 2016-09-16 DIAGNOSIS — N390 Urinary tract infection, site not specified: Secondary | ICD-10-CM | POA: Diagnosis not present

## 2016-09-16 DIAGNOSIS — Z87891 Personal history of nicotine dependence: Secondary | ICD-10-CM | POA: Diagnosis not present

## 2016-09-16 DIAGNOSIS — I509 Heart failure, unspecified: Secondary | ICD-10-CM | POA: Diagnosis not present

## 2016-09-16 DIAGNOSIS — Z8673 Personal history of transient ischemic attack (TIA), and cerebral infarction without residual deficits: Secondary | ICD-10-CM | POA: Diagnosis not present

## 2016-09-16 DIAGNOSIS — N189 Chronic kidney disease, unspecified: Secondary | ICD-10-CM | POA: Diagnosis not present

## 2016-09-16 DIAGNOSIS — S8012XD Contusion of left lower leg, subsequent encounter: Secondary | ICD-10-CM | POA: Diagnosis not present

## 2016-09-16 DIAGNOSIS — G8929 Other chronic pain: Secondary | ICD-10-CM | POA: Diagnosis not present

## 2016-09-16 DIAGNOSIS — M069 Rheumatoid arthritis, unspecified: Secondary | ICD-10-CM | POA: Diagnosis not present

## 2016-09-16 DIAGNOSIS — E11622 Type 2 diabetes mellitus with other skin ulcer: Secondary | ICD-10-CM | POA: Diagnosis not present

## 2016-09-16 DIAGNOSIS — I129 Hypertensive chronic kidney disease with stage 1 through stage 4 chronic kidney disease, or unspecified chronic kidney disease: Secondary | ICD-10-CM | POA: Diagnosis not present

## 2016-09-16 DIAGNOSIS — I890 Lymphedema, not elsewhere classified: Secondary | ICD-10-CM | POA: Diagnosis not present

## 2016-09-16 DIAGNOSIS — K219 Gastro-esophageal reflux disease without esophagitis: Secondary | ICD-10-CM | POA: Diagnosis not present

## 2016-09-16 DIAGNOSIS — M810 Age-related osteoporosis without current pathological fracture: Secondary | ICD-10-CM | POA: Diagnosis not present

## 2016-09-16 DIAGNOSIS — E1122 Type 2 diabetes mellitus with diabetic chronic kidney disease: Secondary | ICD-10-CM | POA: Diagnosis not present

## 2016-09-16 DIAGNOSIS — I872 Venous insufficiency (chronic) (peripheral): Secondary | ICD-10-CM | POA: Diagnosis not present

## 2016-09-16 DIAGNOSIS — M1990 Unspecified osteoarthritis, unspecified site: Secondary | ICD-10-CM | POA: Diagnosis not present

## 2016-09-16 DIAGNOSIS — D649 Anemia, unspecified: Secondary | ICD-10-CM | POA: Diagnosis not present

## 2016-09-22 DIAGNOSIS — S8012XD Contusion of left lower leg, subsequent encounter: Secondary | ICD-10-CM | POA: Diagnosis not present

## 2016-09-22 DIAGNOSIS — I872 Venous insufficiency (chronic) (peripheral): Secondary | ICD-10-CM | POA: Diagnosis not present

## 2016-09-22 DIAGNOSIS — Z87891 Personal history of nicotine dependence: Secondary | ICD-10-CM | POA: Diagnosis not present

## 2016-09-22 DIAGNOSIS — D649 Anemia, unspecified: Secondary | ICD-10-CM | POA: Diagnosis not present

## 2016-09-22 DIAGNOSIS — E7800 Pure hypercholesterolemia, unspecified: Secondary | ICD-10-CM | POA: Diagnosis not present

## 2016-09-22 DIAGNOSIS — I890 Lymphedema, not elsewhere classified: Secondary | ICD-10-CM | POA: Diagnosis not present

## 2016-09-22 DIAGNOSIS — N189 Chronic kidney disease, unspecified: Secondary | ICD-10-CM | POA: Diagnosis not present

## 2016-09-22 DIAGNOSIS — G8929 Other chronic pain: Secondary | ICD-10-CM | POA: Diagnosis not present

## 2016-09-22 DIAGNOSIS — I129 Hypertensive chronic kidney disease with stage 1 through stage 4 chronic kidney disease, or unspecified chronic kidney disease: Secondary | ICD-10-CM | POA: Diagnosis not present

## 2016-09-22 DIAGNOSIS — E1122 Type 2 diabetes mellitus with diabetic chronic kidney disease: Secondary | ICD-10-CM | POA: Diagnosis not present

## 2016-09-22 DIAGNOSIS — M810 Age-related osteoporosis without current pathological fracture: Secondary | ICD-10-CM | POA: Diagnosis not present

## 2016-09-22 DIAGNOSIS — I509 Heart failure, unspecified: Secondary | ICD-10-CM | POA: Diagnosis not present

## 2016-09-22 DIAGNOSIS — E11622 Type 2 diabetes mellitus with other skin ulcer: Secondary | ICD-10-CM | POA: Diagnosis not present

## 2016-09-22 DIAGNOSIS — M1990 Unspecified osteoarthritis, unspecified site: Secondary | ICD-10-CM | POA: Diagnosis not present

## 2016-09-22 DIAGNOSIS — L97222 Non-pressure chronic ulcer of left calf with fat layer exposed: Secondary | ICD-10-CM | POA: Diagnosis not present

## 2016-09-22 DIAGNOSIS — M069 Rheumatoid arthritis, unspecified: Secondary | ICD-10-CM | POA: Diagnosis not present

## 2016-10-01 ENCOUNTER — Telehealth: Payer: Self-pay | Admitting: Registered Nurse

## 2016-10-01 NOTE — Telephone Encounter (Signed)
On 10/01/2016 the NCCSR was reviewed no conflict was seen on the [facility] Reporting System with multiple prescribers. Crystal Fitzpatrick has a signed narcotic contract with our office. If there were any discrepancies this would have been reported to his physician.

## 2016-10-07 ENCOUNTER — Encounter: Payer: Self-pay | Admitting: Registered Nurse

## 2016-10-07 ENCOUNTER — Encounter: Payer: Medicare Other | Attending: Physical Medicine and Rehabilitation | Admitting: Registered Nurse

## 2016-10-07 VITALS — BP 115/71 | HR 72

## 2016-10-07 DIAGNOSIS — M546 Pain in thoracic spine: Secondary | ICD-10-CM

## 2016-10-07 DIAGNOSIS — M1712 Unilateral primary osteoarthritis, left knee: Secondary | ICD-10-CM

## 2016-10-07 DIAGNOSIS — M4716 Other spondylosis with myelopathy, lumbar region: Secondary | ICD-10-CM | POA: Insufficient documentation

## 2016-10-07 DIAGNOSIS — G546 Phantom limb syndrome with pain: Secondary | ICD-10-CM | POA: Diagnosis not present

## 2016-10-07 DIAGNOSIS — G8929 Other chronic pain: Secondary | ICD-10-CM

## 2016-10-07 DIAGNOSIS — M179 Osteoarthritis of knee, unspecified: Secondary | ICD-10-CM | POA: Insufficient documentation

## 2016-10-07 DIAGNOSIS — G609 Hereditary and idiopathic neuropathy, unspecified: Secondary | ICD-10-CM | POA: Diagnosis not present

## 2016-10-07 DIAGNOSIS — M069 Rheumatoid arthritis, unspecified: Secondary | ICD-10-CM | POA: Diagnosis not present

## 2016-10-07 DIAGNOSIS — Z89519 Acquired absence of unspecified leg below knee: Secondary | ICD-10-CM | POA: Insufficient documentation

## 2016-10-07 MED ORDER — OXYCODONE-ACETAMINOPHEN 10-325 MG PO TABS: 1.0000 | ORAL_TABLET | Freq: Four times a day (QID) | ORAL | 0 refills | Status: DC | PRN

## 2016-10-07 MED ORDER — FENTANYL 75 MCG/HR TD PT72: 75.0000 ug | MEDICATED_PATCH | TRANSDERMAL | 0 refills | Status: DC

## 2016-10-07 NOTE — Progress Notes (Signed)
Subjective:    Patient ID: Crystal Fitzpatrick, female    DOB: 27-Apr-1946, 71 y.o.   MRN: [id number]  HPI: Crystal Fitzpatrick is a 71year old female who returns for follow up appointmentfor chronic pain and medication refill. She states her pain is located in her bilateral shoulders and upper back. Occassionally right stump pain (phantom) and left lower extremity ( left lower extremity dressing intact.She rates her pain 6. Her current exercise regime is performing stretching exercises daily.   [facility] weekly. Left lower extremity dressing intact.   Last UDS was performed on 07/16/2016, it was consistent.   Pain Inventory Average Pain 5 Pain Right Now 6 My pain is sharp, burning, dull, stabbing and aching  In the last 24 hours, has pain interfered with the following? General activity 6 Relation with others 6 Enjoyment of life 6 What TIME of day is your pain at its worst? morning and night Sleep (in general) Good  Pain is worse with: some activites Pain improves with: therapy/exercise and medication Relief from Meds: 6  Mobility use a wheelchair transfers alone  Function I need assistance with the following:  meal prep, household duties and shopping  Neuro/Psych bladder control problems  Prior Studies Any changes since last visit?  no  Physicians involved in your care Any changes since last visit?  no   Family History  Problem Relation Age of Onset  . Breast cancer Sister   . Diabetes Other   . Stroke Other        Grandparents   Social History   Social History  . Marital status: Divorced    Spouse name: N/A  . Number of children: 1  . Years of education: N/A   Occupational History  . retired Marketing executive Retired   Social History Main Topics  . Smoking status: Former Games developer  . Smokeless tobacco: Never Used  . Alcohol use No  . Drug use: No  . Sexual activity: Not Asked   Other Topics  Concern  . None   Social History Narrative  . None   Past Surgical History:  Procedure Laterality Date  . AMPUTATION  02/02/2008   below right knee  . foot surgury  1980 and 1981  . NASAL SEPTUM SURGERY    . 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 s/p Breast biopsy  1992  . s/p EGD  2007   with Botox for? Achalasia  . TONSILLECTOMY    . TUBAL LIGATION     Past Medical History:  Diagnosis Date  . ABDOMINAL DISTENSION 10/11/2008  . Acute gouty arthropathy 12/11/2008  . Altered mental status 02/07/2010  . AMPUTATION, BELOW KNEE, RIGHT, HX OF 01/28/2009  . ANAPHYLACTIC SHOCK 05/15/2009  . ANEMIA-NOS 07/17/2008  . ANXIETY 07/17/2008  . ARTHRITIS 01/28/2009  . B12 DEFICIENCY 07/17/2008  . Cellulitis and abscess of leg, except foot 11/15/2008  . CHF 01/28/2009  . Chronic pain syndrome 02/14/2010  . COLONIC POLYPS, HX OF 07/17/2008  . DIABETES MELLITUS, TYPE II 07/17/2008  . Dysuria 11/11/2009  . FOOT PAIN 07/17/2008  . GERD 07/17/2008  . HEARING LOSS, RIGHT EAR 06/04/2009  . HYPERKALEMIA 10/31/2009  . Hyperlipemia 09/24/2010  . Hyperlipidemia 09/24/2010  . HYPERSOMNIA 02/14/2010  . HYPERTENSION 07/17/2008  . HYPONATREMIA 03/20/2010  . Hypoxemia 03/20/2010  . IBS 06/04/2009  . MENOPAUSAL DISORDER 12/03/2009  . NEUROPATHY, HX OF 01/28/2009  . Osteoporosis 11/16/2014  .  PERIPHERAL EDEMA 08/30/2008  . Pneumonia, aspiration (HCC) 12/01/2011   Episode July 2013, tx per [facility]  . Pneumonia, organism unspecified(486) 02/14/2010  . RENAL INSUFFICIENCY 10/31/2009  . Rheumatoid arthritis(714.0) 07/17/2008  . Seizure (HCC) 10/26/2010  . SKIN LESION 06/04/2009  . SKIN ULCER, CHRONIC 02/08/2009  . Spinal stenosis of lumbar region 05/15/2015  . Trigeminal neuralgia 02/14/2010  . UNSPECIFIED HEPATITIS 01/28/2009  . Wheezing 12/03/2009   BP 115/71   Pulse 72   SpO2 93%   Opioid Risk Score:  1 Fall Risk Score:  `1  Depression screen PHQ 2/9  Depression screen [facility] 2/9 10/07/2016 01/27/2016  12/26/2015 09/17/2015 02/25/2015 02/12/2015 01/15/2015  Decreased Interest 0 0 0 0 1 0 0  Down, Depressed, Hopeless 0 0 0 0 1 0 0  PHQ - 2 Score 0 0 0 0 2 0 0  Altered sleeping - - - - 3 - -  Tired, decreased energy - - - - 3 - -  Change in appetite - - - - - - -  Feeling bad or failure about yourself  - - - - - - -  Trouble concentrating - - - - 0 - -  Moving slowly or fidgety/restless - - - - 0 - -  Suicidal thoughts - - - - 0 - -  PHQ-9 Score - - - - 8 - -  Difficult doing work/chores - - - - Not difficult at all - -  Some recent data might be hidden   Review of Systems  Constitutional: Negative.   HENT: Positive for dental problem.        Lost dental plate, having to have new teeth made  Eyes: Negative.   Respiratory: Negative.   Cardiovascular: Negative.   Gastrointestinal: Negative.   Endocrine: Negative.   Genitourinary:       Bladder control  Musculoskeletal: Negative.   Skin: Negative.   Allergic/Immunologic: Negative.   Neurological: Negative.   Hematological: Negative.   Psychiatric/Behavioral: Negative.   All other systems reviewed and are negative.      Objective:   Physical Exam  Constitutional: She is oriented to person, place, and time. She appears well-developed and well-nourished.  HENT:  Head: Normocephalic and atraumatic.  Neck: Normal range of motion. Neck supple.  Cardiovascular: Normal rate and regular rhythm.   Pulmonary/Chest: Effort normal and breath sounds normal.  Musculoskeletal:  Normal Muscle Bulk and Muscle Testing Reveals:  Upper Extremities: Full ROM and Muscle Strength 5/5 Right AC Joint Tenderness Thoracic Paraspinal Tenderness:T-3-T-5 Lower Extremities: Right: BKA Left: Full ROM and Muscle Strength 5/5 Left Lower Extremity Dressing Intact Arrived in wheelchair  Neurological: She is alert and oriented to person, place, and time.  Skin: Skin is warm and dry.  Psychiatric: She has a normal mood and affect.  Nursing note and vitals  reviewed.         Assessment & Plan:  1.Right below-knee amputation, history of phantom limb pain.: Continue Current Medication Regime and Continue to Monitor. 05/023/2018 2. Peripheral neuropathy: Continue Gabapentin and Cymbalta. 10/07/2016 Refilled: Fenatnyl Patch 75 mcg one patch every three days #10  and Continue Oxycodone 10/325mg  one tablet every 6 hours as needed #120.  We will continue the opioid monitoring program, this consists of regular clinic visits, examinations, urine drug screen, pill counts as well as use of [place] Controlled Substance Reporting System. 3. History of rheumatoid arthritis. Continue Current Medication and Exercise and heat Therapy. 10/07/2016 4. Osteoarthritis of left knee:  Continue with Voltaren gel/ Exercise and heat therapy. 10/07/2016 5. Chronic cellulitis, left leg with associated edema:PCP Following/ Attending Wound Care weekly. 10/07/2016 6. Lumbar Spondylosis: Continue current medication regime. Continue HEP.10/07/2016 7. Bilateral Shoulder Pain: Continue HEP as Tolerated.  10/07/2016 8. Muscle Spasm: Continue Flexeril. 10/07/2016  20 minutes of face to face patient care time was spent during this visit. All questions were encouraged and answered.  F/U in 1 month

## 2016-10-08 DIAGNOSIS — L97222 Non-pressure chronic ulcer of left calf with fat layer exposed: Secondary | ICD-10-CM | POA: Diagnosis not present

## 2016-10-08 DIAGNOSIS — G8929 Other chronic pain: Secondary | ICD-10-CM | POA: Diagnosis not present

## 2016-10-08 DIAGNOSIS — Z87891 Personal history of nicotine dependence: Secondary | ICD-10-CM | POA: Diagnosis not present

## 2016-10-08 DIAGNOSIS — I890 Lymphedema, not elsewhere classified: Secondary | ICD-10-CM | POA: Diagnosis not present

## 2016-10-08 DIAGNOSIS — N3000 Acute cystitis without hematuria: Secondary | ICD-10-CM | POA: Diagnosis not present

## 2016-10-08 DIAGNOSIS — L98499 Non-pressure chronic ulcer of skin of other sites with unspecified severity: Secondary | ICD-10-CM | POA: Diagnosis not present

## 2016-10-08 DIAGNOSIS — I509 Heart failure, unspecified: Secondary | ICD-10-CM | POA: Diagnosis not present

## 2016-10-08 DIAGNOSIS — N189 Chronic kidney disease, unspecified: Secondary | ICD-10-CM | POA: Diagnosis not present

## 2016-10-08 DIAGNOSIS — S8012XD Contusion of left lower leg, subsequent encounter: Secondary | ICD-10-CM | POA: Diagnosis not present

## 2016-10-08 DIAGNOSIS — E7800 Pure hypercholesterolemia, unspecified: Secondary | ICD-10-CM | POA: Diagnosis not present

## 2016-10-08 DIAGNOSIS — E11622 Type 2 diabetes mellitus with other skin ulcer: Secondary | ICD-10-CM | POA: Diagnosis not present

## 2016-10-08 DIAGNOSIS — K219 Gastro-esophageal reflux disease without esophagitis: Secondary | ICD-10-CM | POA: Diagnosis not present

## 2016-10-08 DIAGNOSIS — I739 Peripheral vascular disease, unspecified: Secondary | ICD-10-CM | POA: Diagnosis not present

## 2016-10-08 DIAGNOSIS — I129 Hypertensive chronic kidney disease with stage 1 through stage 4 chronic kidney disease, or unspecified chronic kidney disease: Secondary | ICD-10-CM | POA: Diagnosis not present

## 2016-10-08 DIAGNOSIS — M069 Rheumatoid arthritis, unspecified: Secondary | ICD-10-CM | POA: Diagnosis not present

## 2016-10-08 DIAGNOSIS — Z8673 Personal history of transient ischemic attack (TIA), and cerebral infarction without residual deficits: Secondary | ICD-10-CM | POA: Diagnosis not present

## 2016-10-08 DIAGNOSIS — R509 Fever, unspecified: Secondary | ICD-10-CM | POA: Diagnosis not present

## 2016-10-08 DIAGNOSIS — I872 Venous insufficiency (chronic) (peripheral): Secondary | ICD-10-CM | POA: Diagnosis not present

## 2016-10-08 DIAGNOSIS — H9190 Unspecified hearing loss, unspecified ear: Secondary | ICD-10-CM | POA: Diagnosis not present

## 2016-10-14 DIAGNOSIS — N390 Urinary tract infection, site not specified: Secondary | ICD-10-CM | POA: Diagnosis not present

## 2016-10-14 DIAGNOSIS — Z87440 Personal history of urinary (tract) infections: Secondary | ICD-10-CM | POA: Diagnosis not present

## 2016-10-14 DIAGNOSIS — N3941 Urge incontinence: Secondary | ICD-10-CM | POA: Diagnosis not present

## 2016-10-15 ENCOUNTER — Other Ambulatory Visit: Payer: Self-pay | Admitting: Internal Medicine

## 2016-10-20 DIAGNOSIS — L97222 Non-pressure chronic ulcer of left calf with fat layer exposed: Secondary | ICD-10-CM | POA: Diagnosis not present

## 2016-10-20 DIAGNOSIS — I509 Heart failure, unspecified: Secondary | ICD-10-CM | POA: Diagnosis not present

## 2016-10-20 DIAGNOSIS — G8929 Other chronic pain: Secondary | ICD-10-CM | POA: Diagnosis not present

## 2016-10-20 DIAGNOSIS — E1122 Type 2 diabetes mellitus with diabetic chronic kidney disease: Secondary | ICD-10-CM | POA: Diagnosis not present

## 2016-10-20 DIAGNOSIS — Z8673 Personal history of transient ischemic attack (TIA), and cerebral infarction without residual deficits: Secondary | ICD-10-CM | POA: Diagnosis not present

## 2016-10-20 DIAGNOSIS — I890 Lymphedema, not elsewhere classified: Secondary | ICD-10-CM | POA: Diagnosis not present

## 2016-10-20 DIAGNOSIS — I129 Hypertensive chronic kidney disease with stage 1 through stage 4 chronic kidney disease, or unspecified chronic kidney disease: Secondary | ICD-10-CM | POA: Diagnosis not present

## 2016-10-20 DIAGNOSIS — M1990 Unspecified osteoarthritis, unspecified site: Secondary | ICD-10-CM | POA: Diagnosis not present

## 2016-10-20 DIAGNOSIS — E11622 Type 2 diabetes mellitus with other skin ulcer: Secondary | ICD-10-CM | POA: Diagnosis not present

## 2016-10-20 DIAGNOSIS — L089 Local infection of the skin and subcutaneous tissue, unspecified: Secondary | ICD-10-CM | POA: Diagnosis not present

## 2016-10-20 DIAGNOSIS — Z87891 Personal history of nicotine dependence: Secondary | ICD-10-CM | POA: Diagnosis not present

## 2016-10-20 DIAGNOSIS — M069 Rheumatoid arthritis, unspecified: Secondary | ICD-10-CM | POA: Diagnosis not present

## 2016-10-20 DIAGNOSIS — L98499 Non-pressure chronic ulcer of skin of other sites with unspecified severity: Secondary | ICD-10-CM | POA: Diagnosis not present

## 2016-10-20 DIAGNOSIS — E7800 Pure hypercholesterolemia, unspecified: Secondary | ICD-10-CM | POA: Diagnosis not present

## 2016-10-20 DIAGNOSIS — M810 Age-related osteoporosis without current pathological fracture: Secondary | ICD-10-CM | POA: Diagnosis not present

## 2016-10-20 DIAGNOSIS — I872 Venous insufficiency (chronic) (peripheral): Secondary | ICD-10-CM | POA: Diagnosis not present

## 2016-10-20 DIAGNOSIS — K219 Gastro-esophageal reflux disease without esophagitis: Secondary | ICD-10-CM | POA: Diagnosis not present

## 2016-10-20 DIAGNOSIS — N189 Chronic kidney disease, unspecified: Secondary | ICD-10-CM | POA: Diagnosis not present

## 2016-10-28 DIAGNOSIS — I129 Hypertensive chronic kidney disease with stage 1 through stage 4 chronic kidney disease, or unspecified chronic kidney disease: Secondary | ICD-10-CM | POA: Diagnosis not present

## 2016-10-28 DIAGNOSIS — E11622 Type 2 diabetes mellitus with other skin ulcer: Secondary | ICD-10-CM | POA: Diagnosis not present

## 2016-10-28 DIAGNOSIS — D649 Anemia, unspecified: Secondary | ICD-10-CM | POA: Diagnosis not present

## 2016-10-28 DIAGNOSIS — L97222 Non-pressure chronic ulcer of left calf with fat layer exposed: Secondary | ICD-10-CM | POA: Diagnosis not present

## 2016-10-28 DIAGNOSIS — Z8673 Personal history of transient ischemic attack (TIA), and cerebral infarction without residual deficits: Secondary | ICD-10-CM | POA: Diagnosis not present

## 2016-10-28 DIAGNOSIS — N189 Chronic kidney disease, unspecified: Secondary | ICD-10-CM | POA: Diagnosis not present

## 2016-10-28 DIAGNOSIS — E7800 Pure hypercholesterolemia, unspecified: Secondary | ICD-10-CM | POA: Diagnosis not present

## 2016-10-28 DIAGNOSIS — G8929 Other chronic pain: Secondary | ICD-10-CM | POA: Diagnosis not present

## 2016-10-28 DIAGNOSIS — I890 Lymphedema, not elsewhere classified: Secondary | ICD-10-CM | POA: Diagnosis not present

## 2016-10-28 DIAGNOSIS — Z87891 Personal history of nicotine dependence: Secondary | ICD-10-CM | POA: Diagnosis not present

## 2016-10-28 DIAGNOSIS — S8012XD Contusion of left lower leg, subsequent encounter: Secondary | ICD-10-CM | POA: Diagnosis not present

## 2016-10-28 DIAGNOSIS — I872 Venous insufficiency (chronic) (peripheral): Secondary | ICD-10-CM | POA: Diagnosis not present

## 2016-10-28 DIAGNOSIS — E1122 Type 2 diabetes mellitus with diabetic chronic kidney disease: Secondary | ICD-10-CM | POA: Diagnosis not present

## 2016-10-28 DIAGNOSIS — L98499 Non-pressure chronic ulcer of skin of other sites with unspecified severity: Secondary | ICD-10-CM | POA: Diagnosis not present

## 2016-11-03 DIAGNOSIS — N3941 Urge incontinence: Secondary | ICD-10-CM | POA: Diagnosis not present

## 2016-11-09 ENCOUNTER — Encounter: Payer: Self-pay | Admitting: Registered Nurse

## 2016-11-09 ENCOUNTER — Encounter: Payer: Medicare Other | Attending: Physical Medicine and Rehabilitation | Admitting: Registered Nurse

## 2016-11-09 VITALS — BP 121/72 | HR 81

## 2016-11-09 DIAGNOSIS — G894 Chronic pain syndrome: Secondary | ICD-10-CM | POA: Diagnosis not present

## 2016-11-09 DIAGNOSIS — G609 Hereditary and idiopathic neuropathy, unspecified: Secondary | ICD-10-CM

## 2016-11-09 DIAGNOSIS — G546 Phantom limb syndrome with pain: Secondary | ICD-10-CM

## 2016-11-09 DIAGNOSIS — Z89519 Acquired absence of unspecified leg below knee: Secondary | ICD-10-CM | POA: Diagnosis not present

## 2016-11-09 DIAGNOSIS — M1712 Unilateral primary osteoarthritis, left knee: Secondary | ICD-10-CM

## 2016-11-09 DIAGNOSIS — M179 Osteoarthritis of knee, unspecified: Secondary | ICD-10-CM | POA: Insufficient documentation

## 2016-11-09 DIAGNOSIS — M4716 Other spondylosis with myelopathy, lumbar region: Secondary | ICD-10-CM | POA: Diagnosis not present

## 2016-11-09 DIAGNOSIS — M25512 Pain in left shoulder: Secondary | ICD-10-CM

## 2016-11-09 DIAGNOSIS — M47816 Spondylosis without myelopathy or radiculopathy, lumbar region: Secondary | ICD-10-CM

## 2016-11-09 DIAGNOSIS — M069 Rheumatoid arthritis, unspecified: Secondary | ICD-10-CM | POA: Insufficient documentation

## 2016-11-09 DIAGNOSIS — Z79899 Other long term (current) drug therapy: Secondary | ICD-10-CM | POA: Diagnosis not present

## 2016-11-09 DIAGNOSIS — L03116 Cellulitis of left lower limb: Secondary | ICD-10-CM | POA: Diagnosis not present

## 2016-11-09 DIAGNOSIS — Z5181 Encounter for therapeutic drug level monitoring: Secondary | ICD-10-CM | POA: Diagnosis not present

## 2016-11-09 MED ORDER — FENTANYL 75 MCG/HR TD PT72
75.0000 ug | MEDICATED_PATCH | TRANSDERMAL | 0 refills | Status: DC
Start: 2016-11-09 — End: 2016-12-07

## 2016-11-09 MED ORDER — OXYCODONE-ACETAMINOPHEN 10-325 MG PO TABS: 1.0000 | ORAL_TABLET | Freq: Four times a day (QID) | ORAL | 0 refills | Status: DC | PRN

## 2016-11-09 NOTE — Progress Notes (Signed)
Subjective:    Patient ID: Crystal Fitzpatrick, female    DOB: Oct 01, 1945, 71 y.o.   MRN: [id number]  HPI: Crystal Fitzpatrick is a 71year old female who returns for follow up appointmentfor chronic pain and medication refill. She states her pain is located in her left shoulder, lower back, right stump pain (phantom), left knee and left lower extremity ( left lower extremity dressing intact.She rates her pain 7. Her current exercise regime is performing stretching exercises daily (Yoga).   Also [facility] weekly. Left lower extremity dressing intact.   Last UDS was performed on 07/16/2016, it was consistent. Oral swab was performed today.   Pain Inventory Average Pain 7 Pain Right Now 7 My pain is burning, stabbing and aching  In the last 24 hours, has pain interfered with the following? General activity 4 Relation with others 5 Enjoyment of life 4 What TIME of day is your pain at its worst? morning Sleep (in general) Poor  Pain is worse with: sitting and inactivity Pain improves with: heat/ice, therapy/exercise and medication Relief from Meds: 5  Mobility use a wheelchair needs help with transfers  Function Do you have any goals in this area?  no  Neuro/Psych No problems in this area  Prior Studies Any changes since last visit?  no  Physicians involved in your care Any changes since last visit?  no   Family History  Problem Relation Age of Onset  . Breast cancer Sister   . Diabetes Other   . Stroke Other        Grandparents   Social History   Social History  . Marital status: Divorced    Spouse name: N/A  . Number of children: 1  . Years of education: N/A   Occupational History  . retired Marketing executive Retired   Social History Main Topics  . Smoking status: Former Games developer  . Smokeless tobacco: Never Used  . Alcohol use No  . Drug use: No  . Sexual activity: Not on file   Other Topics Concern    . Not on file   Social History Narrative  . No narrative on file   Past Surgical History:  Procedure Laterality Date  . AMPUTATION  02/02/2008   below right knee  . foot surgury  1980 and 1981  . NASAL SEPTUM SURGERY    . 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 s/p Breast biopsy  1992  . s/p EGD  2007   with Botox for? Achalasia  . TONSILLECTOMY    . TUBAL LIGATION     Past Medical History:  Diagnosis Date  . ABDOMINAL DISTENSION 10/11/2008  . Acute gouty arthropathy 12/11/2008  . Altered mental status 02/07/2010  . AMPUTATION, BELOW KNEE, RIGHT, HX OF 01/28/2009  . ANAPHYLACTIC SHOCK 05/15/2009  . ANEMIA-NOS 07/17/2008  . ANXIETY 07/17/2008  . ARTHRITIS 01/28/2009  . B12 DEFICIENCY 07/17/2008  . Cellulitis and abscess of leg, except foot 11/15/2008  . CHF 01/28/2009  . Chronic pain syndrome 02/14/2010  . COLONIC POLYPS, HX OF 07/17/2008  . DIABETES MELLITUS, TYPE II 07/17/2008  . Dysuria 11/11/2009  . FOOT PAIN 07/17/2008  . GERD 07/17/2008  . HEARING LOSS, RIGHT EAR 06/04/2009  . HYPERKALEMIA 10/31/2009  . Hyperlipemia 09/24/2010  . Hyperlipidemia 09/24/2010  . HYPERSOMNIA 02/14/2010  . HYPERTENSION 07/17/2008  . HYPONATREMIA 03/20/2010  . Hypoxemia 03/20/2010  . IBS 06/04/2009  . MENOPAUSAL DISORDER 12/03/2009  .  NEUROPATHY, HX OF 01/28/2009  . Osteoporosis 11/16/2014  . PERIPHERAL EDEMA 08/30/2008  . Pneumonia, aspiration (HCC) 12/01/2011   Episode July 2013, tx per [facility]  . Pneumonia, organism unspecified(486) 02/14/2010  . RENAL INSUFFICIENCY 10/31/2009  . Rheumatoid arthritis(714.0) 07/17/2008  . Seizure (HCC) 10/26/2010  . SKIN LESION 06/04/2009  . SKIN ULCER, CHRONIC 02/08/2009  . Spinal stenosis of lumbar region 05/15/2015  . Trigeminal neuralgia 02/14/2010  . UNSPECIFIED HEPATITIS 01/28/2009  . Wheezing 12/03/2009   There were no vitals taken for this visit.  Opioid Risk Score:   Fall Risk Score:  `1  Depression screen PHQ 2/9  Depression screen [facility] 2/9  10/07/2016 01/27/2016 12/26/2015 09/17/2015 02/25/2015 02/12/2015 01/15/2015  Decreased Interest 0 0 0 0 1 0 0  Down, Depressed, Hopeless 0 0 0 0 1 0 0  PHQ - 2 Score 0 0 0 0 2 0 0  Altered sleeping - - - - 3 - -  Tired, decreased energy - - - - 3 - -  Change in appetite - - - - - - -  Feeling bad or failure about yourself  - - - - - - -  Trouble concentrating - - - - 0 - -  Moving slowly or fidgety/restless - - - - 0 - -  Suicidal thoughts - - - - 0 - -  PHQ-9 Score - - - - 8 - -  Difficult doing work/chores - - - - Not difficult at all - -  Some recent data might be hidden    Review of Systems  Constitutional: Negative.   HENT: Negative.   Eyes: Negative.   Respiratory: Negative.   Cardiovascular: Negative.   Gastrointestinal: Negative.   Endocrine: Negative.   Genitourinary: Negative.   Musculoskeletal: Negative.   Skin: Negative.   Allergic/Immunologic: Negative.   Neurological: Negative.   Hematological: Negative.   Psychiatric/Behavioral: Negative.   All other systems reviewed and are negative.      Objective:   Physical Exam  Constitutional: She is oriented to person, place, and time. She appears well-developed and well-nourished.  HENT:  Head: Normocephalic and atraumatic.  Neck: Normal range of motion. Neck supple.  Cardiovascular: Normal rate and regular rhythm.   Pulmonary/Chest: Effort normal and breath sounds normal.  Musculoskeletal:  Normal Muscle Bulk and Muscle Testing Reveals: Upper Extremities: Full ROM and Muscle Strength 5/5 Lumbar Paraspinal Tenderness: L-3-L-5 Lower Extremities: Right: BKA Left: Full ROM and Muscle Strength 5/5 Left Lower Extremity dressing intact Arrived in wheelchair  Neurological: She is alert and oriented to person, place, and time.  Skin: Skin is warm and dry.  Psychiatric: She has a normal mood and affect.  Nursing note and vitals reviewed.         Assessment & Plan:  1.Right below-knee amputation, history of phantom  limb pain.: Continue Current Medication Regime and Continue to Monitor. 11/09/2016 2. Peripheral neuropathy: Continue Gabapentin and Cymbalta. 11/09/2016 Refilled: Fenatnyl Patch 75 mcg one patch every three days #10  and Continue Oxycodone 10/325mg  one tablet every 6 hours as needed #120.  We will continue the opioid monitoring program, this consists of regular clinic visits, examinations, urine drug screen, pill counts as well as use of [place] Controlled Substance Reporting System. 3. History of rheumatoid arthritis. Continue Current Medication and Exercise and heat Therapy. 11/09/2016 4. Osteoarthritis of left knee: Continue with Voltaren gel/ Exercise and heat therapy. 11/09/2016 5. Chronic cellulitis, left leg with associated edema:PCP Following/ Attending Wound  Care weekly. 11/09/2016 6. Lumbar Spondylosis: Continue current medication regime. Continue HEP.11/09/2016 7. Bilateral Shoulder Pain: Continue HEP as Tolerated.  11/09/2016 8. Muscle Spasm: Continue Flexeril. 11/09/2016  20 minutes of face to face patient care time was spent during this visit. All questions were encouraged and answered.  F/U in 1 month

## 2016-11-12 ENCOUNTER — Other Ambulatory Visit: Payer: Self-pay | Admitting: Internal Medicine

## 2016-11-12 ENCOUNTER — Other Ambulatory Visit: Payer: Self-pay | Admitting: Physical Medicine & Rehabilitation

## 2016-11-12 DIAGNOSIS — G609 Hereditary and idiopathic neuropathy, unspecified: Secondary | ICD-10-CM

## 2016-11-12 DIAGNOSIS — M4716 Other spondylosis with myelopathy, lumbar region: Secondary | ICD-10-CM

## 2016-11-12 DIAGNOSIS — G546 Phantom limb syndrome with pain: Secondary | ICD-10-CM

## 2016-11-14 LAB — DRUG TOX ALC METAB W/CON, ORAL FLD: Alcohol Metabolite: NEGATIVE ng/mL (ref <25)

## 2016-11-14 LAB — DRUG TOX MONITOR 1 W/CONF, ORAL FLD
AMPHETAMINES: NEGATIVE ng/mL (ref <10)
BARBITURATES: NEGATIVE ng/mL (ref <10)
BENZODIAZEPINES: NEGATIVE ng/mL (ref <0.50)
BUPRENORPHINE: NEGATIVE ng/mL (ref <0.025)
COCAINE: NEGATIVE ng/mL (ref <2.5)
Codeine: NEGATIVE ng/mL (ref <2.5)
Dihydrocodeine: NEGATIVE ng/mL (ref <2.5)
FENTANYL: POSITIVE ng/mL — AB (ref <0.10)
Fentanyl: 4.71 ng/mL — ABNORMAL HIGH (ref <0.10)
Heroin Metabolite: NEGATIVE ng/mL (ref <1.0)
Hydrocodone: NEGATIVE ng/mL (ref <2.5)
Hydromorphone: NEGATIVE ng/mL (ref <2.5)
MDMA: NEGATIVE ng/mL (ref <10)
MEPERIDINE: NEGATIVE ng/mL (ref <5.0)
METHADONE: NEGATIVE ng/mL (ref <5.0)
MORPHINE: NEGATIVE ng/mL (ref <2.5)
Marijuana: NEGATIVE ng/mL (ref <2.5)
Meprobamate: NEGATIVE ng/mL (ref <2.5)
NICOTINE METABOLITE: NEGATIVE ng/mL (ref <5.0)
NORHYDROCODONE: NEGATIVE ng/mL (ref <2.5)
Noroxycodone: 51.3 ng/mL — ABNORMAL HIGH (ref <2.5)
OPIATES: POSITIVE ng/mL — AB (ref <2.5)
OXYCODONE: 133.1 ng/mL — AB (ref <2.5)
OXYMORPHONE: NEGATIVE ng/mL (ref <2.5)
PHENCYCLIDINE: NEGATIVE ng/mL (ref <10)
Propoxyphene: NEGATIVE ng/mL (ref <5.0)
TRAMADOL: NEGATIVE ng/mL (ref <5.0)
Tapentadol: NEGATIVE ng/mL (ref <5.0)
ZOLPIDEM: NEGATIVE ng/mL (ref <5.0)

## 2016-11-24 ENCOUNTER — Telehealth: Payer: Self-pay | Admitting: *Deleted

## 2016-11-24 NOTE — Telephone Encounter (Signed)
Urine drug screen for this encounter is consistent for prescribed medication 

## 2016-11-26 DIAGNOSIS — M1990 Unspecified osteoarthritis, unspecified site: Secondary | ICD-10-CM | POA: Diagnosis not present

## 2016-11-26 DIAGNOSIS — D649 Anemia, unspecified: Secondary | ICD-10-CM | POA: Diagnosis not present

## 2016-11-26 DIAGNOSIS — E11622 Type 2 diabetes mellitus with other skin ulcer: Secondary | ICD-10-CM | POA: Diagnosis not present

## 2016-11-26 DIAGNOSIS — L97222 Non-pressure chronic ulcer of left calf with fat layer exposed: Secondary | ICD-10-CM | POA: Diagnosis not present

## 2016-11-26 DIAGNOSIS — G8929 Other chronic pain: Secondary | ICD-10-CM | POA: Diagnosis not present

## 2016-11-26 DIAGNOSIS — Z8673 Personal history of transient ischemic attack (TIA), and cerebral infarction without residual deficits: Secondary | ICD-10-CM | POA: Diagnosis not present

## 2016-11-26 DIAGNOSIS — M069 Rheumatoid arthritis, unspecified: Secondary | ICD-10-CM | POA: Diagnosis not present

## 2016-11-26 DIAGNOSIS — E1122 Type 2 diabetes mellitus with diabetic chronic kidney disease: Secondary | ICD-10-CM | POA: Diagnosis not present

## 2016-11-26 DIAGNOSIS — N189 Chronic kidney disease, unspecified: Secondary | ICD-10-CM | POA: Diagnosis not present

## 2016-11-26 DIAGNOSIS — E7800 Pure hypercholesterolemia, unspecified: Secondary | ICD-10-CM | POA: Diagnosis not present

## 2016-11-26 DIAGNOSIS — M810 Age-related osteoporosis without current pathological fracture: Secondary | ICD-10-CM | POA: Diagnosis not present

## 2016-11-26 DIAGNOSIS — L89322 Pressure ulcer of left buttock, stage 2: Secondary | ICD-10-CM | POA: Diagnosis not present

## 2016-11-26 DIAGNOSIS — I739 Peripheral vascular disease, unspecified: Secondary | ICD-10-CM | POA: Diagnosis not present

## 2016-11-26 DIAGNOSIS — I129 Hypertensive chronic kidney disease with stage 1 through stage 4 chronic kidney disease, or unspecified chronic kidney disease: Secondary | ICD-10-CM | POA: Diagnosis not present

## 2016-11-26 DIAGNOSIS — Z87891 Personal history of nicotine dependence: Secondary | ICD-10-CM | POA: Diagnosis not present

## 2016-11-26 DIAGNOSIS — H9190 Unspecified hearing loss, unspecified ear: Secondary | ICD-10-CM | POA: Diagnosis not present

## 2016-11-26 DIAGNOSIS — K219 Gastro-esophageal reflux disease without esophagitis: Secondary | ICD-10-CM | POA: Diagnosis not present

## 2016-12-02 DIAGNOSIS — N390 Urinary tract infection, site not specified: Secondary | ICD-10-CM | POA: Diagnosis not present

## 2016-12-02 DIAGNOSIS — G8929 Other chronic pain: Secondary | ICD-10-CM | POA: Diagnosis not present

## 2016-12-02 DIAGNOSIS — M069 Rheumatoid arthritis, unspecified: Secondary | ICD-10-CM | POA: Diagnosis not present

## 2016-12-02 DIAGNOSIS — Z8673 Personal history of transient ischemic attack (TIA), and cerebral infarction without residual deficits: Secondary | ICD-10-CM | POA: Diagnosis not present

## 2016-12-02 DIAGNOSIS — N189 Chronic kidney disease, unspecified: Secondary | ICD-10-CM | POA: Diagnosis not present

## 2016-12-02 DIAGNOSIS — I129 Hypertensive chronic kidney disease with stage 1 through stage 4 chronic kidney disease, or unspecified chronic kidney disease: Secondary | ICD-10-CM | POA: Diagnosis not present

## 2016-12-02 DIAGNOSIS — R300 Dysuria: Secondary | ICD-10-CM | POA: Diagnosis not present

## 2016-12-02 DIAGNOSIS — I739 Peripheral vascular disease, unspecified: Secondary | ICD-10-CM | POA: Diagnosis not present

## 2016-12-02 DIAGNOSIS — D649 Anemia, unspecified: Secondary | ICD-10-CM | POA: Diagnosis not present

## 2016-12-02 DIAGNOSIS — Z87891 Personal history of nicotine dependence: Secondary | ICD-10-CM | POA: Diagnosis not present

## 2016-12-02 DIAGNOSIS — L97222 Non-pressure chronic ulcer of left calf with fat layer exposed: Secondary | ICD-10-CM | POA: Diagnosis not present

## 2016-12-02 DIAGNOSIS — L89322 Pressure ulcer of left buttock, stage 2: Secondary | ICD-10-CM | POA: Diagnosis not present

## 2016-12-02 DIAGNOSIS — M1990 Unspecified osteoarthritis, unspecified site: Secondary | ICD-10-CM | POA: Diagnosis not present

## 2016-12-02 DIAGNOSIS — I872 Venous insufficiency (chronic) (peripheral): Secondary | ICD-10-CM | POA: Diagnosis not present

## 2016-12-02 DIAGNOSIS — E7800 Pure hypercholesterolemia, unspecified: Secondary | ICD-10-CM | POA: Diagnosis not present

## 2016-12-02 DIAGNOSIS — E1122 Type 2 diabetes mellitus with diabetic chronic kidney disease: Secondary | ICD-10-CM | POA: Diagnosis not present

## 2016-12-02 DIAGNOSIS — I509 Heart failure, unspecified: Secondary | ICD-10-CM | POA: Diagnosis not present

## 2016-12-02 DIAGNOSIS — E11622 Type 2 diabetes mellitus with other skin ulcer: Secondary | ICD-10-CM | POA: Diagnosis not present

## 2016-12-02 DIAGNOSIS — M810 Age-related osteoporosis without current pathological fracture: Secondary | ICD-10-CM | POA: Diagnosis not present

## 2016-12-07 ENCOUNTER — Encounter: Payer: Self-pay | Admitting: Registered Nurse

## 2016-12-07 ENCOUNTER — Encounter: Payer: Medicare Other | Attending: Physical Medicine and Rehabilitation | Admitting: Registered Nurse

## 2016-12-07 VITALS — BP 164/86 | HR 74 | Temp 98.8°F

## 2016-12-07 DIAGNOSIS — G894 Chronic pain syndrome: Secondary | ICD-10-CM | POA: Diagnosis not present

## 2016-12-07 DIAGNOSIS — Z5181 Encounter for therapeutic drug level monitoring: Secondary | ICD-10-CM | POA: Diagnosis not present

## 2016-12-07 DIAGNOSIS — M069 Rheumatoid arthritis, unspecified: Secondary | ICD-10-CM | POA: Diagnosis not present

## 2016-12-07 DIAGNOSIS — Z79899 Other long term (current) drug therapy: Secondary | ICD-10-CM

## 2016-12-07 DIAGNOSIS — G609 Hereditary and idiopathic neuropathy, unspecified: Secondary | ICD-10-CM | POA: Diagnosis not present

## 2016-12-07 DIAGNOSIS — Z89519 Acquired absence of unspecified leg below knee: Secondary | ICD-10-CM | POA: Diagnosis not present

## 2016-12-07 DIAGNOSIS — M4716 Other spondylosis with myelopathy, lumbar region: Secondary | ICD-10-CM | POA: Diagnosis not present

## 2016-12-07 DIAGNOSIS — M179 Osteoarthritis of knee, unspecified: Secondary | ICD-10-CM | POA: Insufficient documentation

## 2016-12-07 DIAGNOSIS — G546 Phantom limb syndrome with pain: Secondary | ICD-10-CM | POA: Diagnosis not present

## 2016-12-07 MED ORDER — FENTANYL 75 MCG/HR TD PT72: 75.0000 ug | MEDICATED_PATCH | TRANSDERMAL | 0 refills | Status: DC

## 2016-12-07 MED ORDER — OXYCODONE-ACETAMINOPHEN 10-325 MG PO TABS: 1.0000 | ORAL_TABLET | Freq: Four times a day (QID) | ORAL | 0 refills | Status: DC | PRN

## 2016-12-07 NOTE — Progress Notes (Signed)
Subjective:    Patient ID: Crystal Fitzpatrick, female    DOB: 12/04/1945, 71 y.o.   MRN: [id number]  HPI: Crystal Fitzpatrick is a 71year old female who returns for follow up appointmentfor chronic pain and medication refill. She states her pain is located in her  lower back,right stump pain (phantom) and left lower extremity ( left lower extremity dressing intact.She rated her average  pain 9. Her current exercise regime is performing stretching exercises daily (Yoga).   Crystal Fitzpatrick stated her buttocks was very painful, her buttock noted with Stage II pressure ulcer noted from left left hip to left lower extremity (17 inches), it has the appearance of cellulitis very painful to palpation. Crystal Fitzpatrick states she has been running fevers a night. Temperature today 98.8. Also stated she has been trying to use the Nitrous Oxide and trying to place the steroid cream, due to her habitus and being an amputee she is having difficulty with applying the cream. She lives alone and has no one one to help her to perform the treatment modality that has been prescribed by [facility]. I voiced concern to Crystal Fitzpatrick she refuses ED evaluation at this time. She has a scheduled appointment with the [facility] on 12/09/2016. [facility] was called on 12/09/2016, this provider left a message to re-check her Stage II pressure ulcer.   Also [facility] weekly. Left lower extremity dressing intact.   Last UDS was performed on 07/16/2016, it was consistent. Oral swab was performed today.   Pain Inventory Average Pain 9 Pain Right Now . My pain is sharp and stabbing  In the last 24 hours, has pain interfered with the following? General activity 7 Relation with others 8 Enjoyment of life 9 What TIME of day is your pain at its worst? .morning, daytime, night Sleep (in general) Poor  Pain is worse with: sitting Pain improves with: rest and medication Relief  from Meds: 5  Mobility Do you have any goals in this area?  no  Function Do you have any goals in this area?  no  Neuro/Psych No problems in this area  Prior Studies .  Physicians involved in your care .   Family History  Problem Relation Age of Onset  . Breast cancer Sister   . Diabetes Other   . Stroke Other        Grandparents   Social History   Social History  . Marital status: Divorced    Spouse name: N/A  . Number of children: 1  . Years of education: N/A   Occupational History  . retired Marketing executive Retired   Social History Main Topics  . Smoking status: Former Games developer  . Smokeless tobacco: Never Used  . Alcohol use No  . Drug use: No  . Sexual activity: Not Asked   Other Topics Concern  . None   Social History Narrative  . None   Past Surgical History:  Procedure Laterality Date  . AMPUTATION  02/02/2008   below right knee  . foot surgury  1980 and 1981  . NASAL SEPTUM SURGERY    . 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 s/p Breast biopsy  1992  . s/p EGD  2007   with Botox for? Achalasia  . TONSILLECTOMY    . TUBAL LIGATION     Past Medical History:  Diagnosis Date  . ABDOMINAL DISTENSION  10/11/2008  . Acute gouty arthropathy 12/11/2008  . Altered mental status 02/07/2010  . AMPUTATION, BELOW KNEE, RIGHT, HX OF 01/28/2009  . ANAPHYLACTIC SHOCK 05/15/2009  . ANEMIA-NOS 07/17/2008  . ANXIETY 07/17/2008  . ARTHRITIS 01/28/2009  . B12 DEFICIENCY 07/17/2008  . Cellulitis and abscess of leg, except foot 11/15/2008  . CHF 01/28/2009  . Chronic pain syndrome 02/14/2010  . COLONIC POLYPS, HX OF 07/17/2008  . DIABETES MELLITUS, TYPE II 07/17/2008  . Dysuria 11/11/2009  . FOOT PAIN 07/17/2008  . GERD 07/17/2008  . HEARING LOSS, RIGHT EAR 06/04/2009  . HYPERKALEMIA 10/31/2009  . Hyperlipemia 09/24/2010  . Hyperlipidemia 09/24/2010  . HYPERSOMNIA 02/14/2010  . HYPERTENSION 07/17/2008  . HYPONATREMIA 03/20/2010  . Hypoxemia 03/20/2010  . IBS  06/04/2009  . MENOPAUSAL DISORDER 12/03/2009  . NEUROPATHY, HX OF 01/28/2009  . Osteoporosis 11/16/2014  . PERIPHERAL EDEMA 08/30/2008  . Pneumonia, aspiration (HCC) 12/01/2011   Episode July 2013, tx per [facility]  . Pneumonia, organism unspecified(486) 02/14/2010  . RENAL INSUFFICIENCY 10/31/2009  . Rheumatoid arthritis(714.0) 07/17/2008  . Seizure (HCC) 10/26/2010  . SKIN LESION 06/04/2009  . SKIN ULCER, CHRONIC 02/08/2009  . Spinal stenosis of lumbar region 05/15/2015  . Trigeminal neuralgia 02/14/2010  . UNSPECIFIED HEPATITIS 01/28/2009  . Wheezing 12/03/2009   BP (!) 164/86   Pulse 74   Temp 98.8 F (37.1 C)   SpO2 94%   Opioid Risk Score:   Fall Risk Score:  `1  Depression screen PHQ 2/9  Depression screen [facility] 2/9 10/07/2016 01/27/2016 12/26/2015 09/17/2015 02/25/2015 02/12/2015 01/15/2015  Decreased Interest 0 0 0 0 1 0 0  Down, Depressed, Hopeless 0 0 0 0 1 0 0  PHQ - 2 Score 0 0 0 0 2 0 0  Altered sleeping - - - - 3 - -  Tired, decreased energy - - - - 3 - -  Change in appetite - - - - - - -  Feeling bad or failure about yourself  - - - - - - -  Trouble concentrating - - - - 0 - -  Moving slowly or fidgety/restless - - - - 0 - -  Suicidal thoughts - - - - 0 - -  PHQ-9 Score - - - - 8 - -  Difficult doing work/chores - - - - Not difficult at all - -  Some recent data might be hidden     Review of Systems  Constitutional: Negative.   HENT: Negative.   Eyes: Negative.   Respiratory: Negative.   Cardiovascular: Negative.   Gastrointestinal: Negative.   Endocrine: Negative.   Genitourinary: Negative.   Musculoskeletal: Negative.   Skin: Negative.   Neurological: Negative.   Hematological: Negative.   Psychiatric/Behavioral: Negative.   All other systems reviewed and are negative.      Objective:   Physical Exam  Constitutional: She is oriented to person, place, and time. She appears well-developed and well-nourished.  HENT:  Head: Normocephalic and  atraumatic.  Neck: Normal range of motion. Neck supple.  Cardiovascular: Normal rate and regular rhythm.   Pulmonary/Chest: Effort normal and breath sounds normal.  Musculoskeletal:  Normal Muscle Bulk and Muscle Testing Reveals: Upper Extremities: Full ROM and Muscle Strength 5/5 Lumbar Paraspinal Tenderness: L-3-L-5 Lower Extremities: Right BKA Left: Decreased ROM and Muscle Strength 4/5 Left Lower Dressing Intact Arrived in Wheelchair   Neurological: She is alert and oriented to person, place, and time.  Skin: Skin is warm and dry.  Stage II Pressure ulcer left hip/ buttock/ left lower extremity with appearance of Cellulitis  Psychiatric: She has a normal mood and affect.  Nursing note and vitals reviewed.         Assessment & Plan:  1.Right below-knee amputation, history of phantom limb pain.: Continue Current Medication Regime and Continue to Monitor. 12/07/2016 2. Peripheral neuropathy: Continue Gabapentin and Cymbalta. 12/07/2016 Refilled: Fenatnyl Patch 75 mcg one patch every three days #10  and Continue Oxycodone 10/325mg  one tablet every 6 hours as needed #120.  We will continue the opioid monitoring program, this consists of regular clinic visits, examinations, urine drug screen, pill counts as well as use of [place] Controlled Substance Reporting System. 3. History of rheumatoid arthritis. Continue Current Medication and Exercise and heat Therapy. 12/07/2016 4. Osteoarthritis of left knee: Continue with Voltaren gel/ Exercise and heat therapy. 12/07/2016 5. Chronic cellulitis, left leg with associated edema:PCP Following/ Attending Wound Care weekly. 12/07/2016 6. Lumbar Spondylosis: Continue current medication regime. Continue HEP.12/07/2016 7. Bilateral Shoulder Pain: Continue HEP as Tolerated. 12/07/2016 8. Muscle Spasm: Continue Flexeril. 12/07/2016  60 minutes of face to face patient care time was spent during this visit. All questions were  encouraged and answered.   F/U in 1 month

## 2016-12-17 DIAGNOSIS — M1712 Unilateral primary osteoarthritis, left knee: Secondary | ICD-10-CM | POA: Diagnosis not present

## 2016-12-17 DIAGNOSIS — I739 Peripheral vascular disease, unspecified: Secondary | ICD-10-CM | POA: Diagnosis not present

## 2016-12-17 DIAGNOSIS — E11622 Type 2 diabetes mellitus with other skin ulcer: Secondary | ICD-10-CM | POA: Diagnosis not present

## 2016-12-17 DIAGNOSIS — L97222 Non-pressure chronic ulcer of left calf with fat layer exposed: Secondary | ICD-10-CM | POA: Diagnosis not present

## 2016-12-17 DIAGNOSIS — M810 Age-related osteoporosis without current pathological fracture: Secondary | ICD-10-CM | POA: Diagnosis not present

## 2016-12-17 DIAGNOSIS — M069 Rheumatoid arthritis, unspecified: Secondary | ICD-10-CM | POA: Diagnosis not present

## 2016-12-17 DIAGNOSIS — L309 Dermatitis, unspecified: Secondary | ICD-10-CM | POA: Diagnosis not present

## 2016-12-17 DIAGNOSIS — D649 Anemia, unspecified: Secondary | ICD-10-CM | POA: Diagnosis not present

## 2016-12-17 DIAGNOSIS — I890 Lymphedema, not elsewhere classified: Secondary | ICD-10-CM | POA: Diagnosis not present

## 2016-12-17 DIAGNOSIS — H9190 Unspecified hearing loss, unspecified ear: Secondary | ICD-10-CM | POA: Diagnosis not present

## 2016-12-17 DIAGNOSIS — L98499 Non-pressure chronic ulcer of skin of other sites with unspecified severity: Secondary | ICD-10-CM | POA: Diagnosis not present

## 2016-12-17 DIAGNOSIS — G8929 Other chronic pain: Secondary | ICD-10-CM | POA: Diagnosis not present

## 2016-12-17 DIAGNOSIS — E1122 Type 2 diabetes mellitus with diabetic chronic kidney disease: Secondary | ICD-10-CM | POA: Diagnosis not present

## 2016-12-17 DIAGNOSIS — I129 Hypertensive chronic kidney disease with stage 1 through stage 4 chronic kidney disease, or unspecified chronic kidney disease: Secondary | ICD-10-CM | POA: Diagnosis not present

## 2016-12-17 DIAGNOSIS — I872 Venous insufficiency (chronic) (peripheral): Secondary | ICD-10-CM | POA: Diagnosis not present

## 2016-12-17 DIAGNOSIS — Z87891 Personal history of nicotine dependence: Secondary | ICD-10-CM | POA: Diagnosis not present

## 2016-12-17 DIAGNOSIS — E7800 Pure hypercholesterolemia, unspecified: Secondary | ICD-10-CM | POA: Diagnosis not present

## 2016-12-17 DIAGNOSIS — I509 Heart failure, unspecified: Secondary | ICD-10-CM | POA: Diagnosis not present

## 2016-12-17 DIAGNOSIS — N189 Chronic kidney disease, unspecified: Secondary | ICD-10-CM | POA: Diagnosis not present

## 2016-12-25 ENCOUNTER — Other Ambulatory Visit: Payer: Self-pay | Admitting: *Deleted

## 2016-12-25 ENCOUNTER — Other Ambulatory Visit: Payer: Self-pay

## 2016-12-25 MED ORDER — PROMETHAZINE HCL 25 MG PO TABS: 25.0000 mg | ORAL_TABLET | Freq: Four times a day (QID) | ORAL | 2 refills | Status: DC | PRN

## 2016-12-29 DIAGNOSIS — M069 Rheumatoid arthritis, unspecified: Secondary | ICD-10-CM | POA: Diagnosis not present

## 2016-12-29 DIAGNOSIS — I129 Hypertensive chronic kidney disease with stage 1 through stage 4 chronic kidney disease, or unspecified chronic kidney disease: Secondary | ICD-10-CM | POA: Diagnosis not present

## 2016-12-29 DIAGNOSIS — G8929 Other chronic pain: Secondary | ICD-10-CM | POA: Diagnosis not present

## 2016-12-29 DIAGNOSIS — I739 Peripheral vascular disease, unspecified: Secondary | ICD-10-CM | POA: Diagnosis not present

## 2016-12-29 DIAGNOSIS — N189 Chronic kidney disease, unspecified: Secondary | ICD-10-CM | POA: Diagnosis not present

## 2016-12-29 DIAGNOSIS — L89322 Pressure ulcer of left buttock, stage 2: Secondary | ICD-10-CM | POA: Diagnosis not present

## 2016-12-29 DIAGNOSIS — K219 Gastro-esophageal reflux disease without esophagitis: Secondary | ICD-10-CM | POA: Diagnosis not present

## 2016-12-29 DIAGNOSIS — E11622 Type 2 diabetes mellitus with other skin ulcer: Secondary | ICD-10-CM | POA: Diagnosis not present

## 2016-12-29 DIAGNOSIS — L98499 Non-pressure chronic ulcer of skin of other sites with unspecified severity: Secondary | ICD-10-CM | POA: Diagnosis not present

## 2016-12-29 DIAGNOSIS — I890 Lymphedema, not elsewhere classified: Secondary | ICD-10-CM | POA: Diagnosis not present

## 2016-12-29 DIAGNOSIS — D649 Anemia, unspecified: Secondary | ICD-10-CM | POA: Diagnosis not present

## 2016-12-29 DIAGNOSIS — M810 Age-related osteoporosis without current pathological fracture: Secondary | ICD-10-CM | POA: Diagnosis not present

## 2016-12-29 DIAGNOSIS — E7800 Pure hypercholesterolemia, unspecified: Secondary | ICD-10-CM | POA: Diagnosis not present

## 2016-12-29 DIAGNOSIS — L97222 Non-pressure chronic ulcer of left calf with fat layer exposed: Secondary | ICD-10-CM | POA: Diagnosis not present

## 2016-12-29 DIAGNOSIS — Z87891 Personal history of nicotine dependence: Secondary | ICD-10-CM | POA: Diagnosis not present

## 2016-12-29 DIAGNOSIS — M1990 Unspecified osteoarthritis, unspecified site: Secondary | ICD-10-CM | POA: Diagnosis not present

## 2016-12-29 DIAGNOSIS — I872 Venous insufficiency (chronic) (peripheral): Secondary | ICD-10-CM | POA: Diagnosis not present

## 2016-12-29 DIAGNOSIS — I509 Heart failure, unspecified: Secondary | ICD-10-CM | POA: Diagnosis not present

## 2017-01-04 ENCOUNTER — Encounter: Payer: Medicare Other | Attending: Physical Medicine and Rehabilitation | Admitting: Registered Nurse

## 2017-01-04 ENCOUNTER — Encounter: Payer: Self-pay | Admitting: Registered Nurse

## 2017-01-04 VITALS — BP 129/72 | HR 76

## 2017-01-04 DIAGNOSIS — G609 Hereditary and idiopathic neuropathy, unspecified: Secondary | ICD-10-CM | POA: Diagnosis not present

## 2017-01-04 DIAGNOSIS — G546 Phantom limb syndrome with pain: Secondary | ICD-10-CM

## 2017-01-04 DIAGNOSIS — M069 Rheumatoid arthritis, unspecified: Secondary | ICD-10-CM | POA: Diagnosis not present

## 2017-01-04 DIAGNOSIS — Z89519 Acquired absence of unspecified leg below knee: Secondary | ICD-10-CM

## 2017-01-04 DIAGNOSIS — Z5181 Encounter for therapeutic drug level monitoring: Secondary | ICD-10-CM

## 2017-01-04 DIAGNOSIS — Z79899 Other long term (current) drug therapy: Secondary | ICD-10-CM | POA: Diagnosis not present

## 2017-01-04 DIAGNOSIS — M4716 Other spondylosis with myelopathy, lumbar region: Secondary | ICD-10-CM

## 2017-01-04 DIAGNOSIS — G894 Chronic pain syndrome: Secondary | ICD-10-CM

## 2017-01-04 DIAGNOSIS — M179 Osteoarthritis of knee, unspecified: Secondary | ICD-10-CM | POA: Diagnosis not present

## 2017-01-04 MED ORDER — FENTANYL 75 MCG/HR TD PT72: 75.0000 ug | MEDICATED_PATCH | TRANSDERMAL | 0 refills | Status: DC

## 2017-01-04 MED ORDER — OXYCODONE-ACETAMINOPHEN 10-325 MG PO TABS: 1.0000 | ORAL_TABLET | Freq: Four times a day (QID) | ORAL | 0 refills | Status: DC | PRN

## 2017-01-04 NOTE — Progress Notes (Signed)
Subjective:    Patient ID: Crystal Fitzpatrick, female    DOB: Nov 20, 1945, 71 y.o.   MRN: [id number]  HPI:  Crystal Fitzpatrick is a 71year old female who returns for follow up appointmentfor chronic pain and medication refill. She states her pain is located in her  lower back,right stump pain (phantom)and left lower extremity ( left lower extremity dressing intact.She rates her pain 9. Her current exercise regime is performing stretching exercises daily (Yoga).   [name] states her buttocks are still painful she has Stage II pressure ulcer noted. [facility] Following.   [facility] weekly on Left lower extremity and Brad changing dressing twice a week, notes was reviewed, dressing intact.  Oral Swab was performed on 11/09/2016, it was consistent.   Pain Inventory Average Pain 7 Pain Right Now 7 My pain is .  In the last 24 hours, has pain interfered with the following? General activity 7 Relation with others 7 Enjoyment of life 7 What TIME of day is your pain at its worst? . Sleep (in general) Fair  Pain is worse with: . Pain improves with: . Relief from Meds: 5  Mobility Do you have any goals in this area?  yes  Function Do you have any goals in this area?  yes  Neuro/Psych No problems in this area  Prior Studies Any changes since last visit?  no  Physicians involved in your care Any changes since last visit?  no   Family History  Problem Relation Age of Onset  . Breast cancer Sister   . Diabetes Other   . Stroke Other        Grandparents   Social History   Social History  . Marital status: Divorced    Spouse name: N/A  . Number of children: 1  . Years of education: N/A   Occupational History  . retired Marketing executive Retired   Social History Main Topics  . Smoking status: Former Games developer  . Smokeless tobacco: Never Used  . Alcohol use No  . Drug use: No  . Sexual  activity: Not on file   Other Topics Concern  . Not on file   Social History Narrative  . No narrative on file   Past Surgical History:  Procedure Laterality Date  . AMPUTATION  02/02/2008   below right knee  . foot surgury  1980 and 1981  . NASAL SEPTUM SURGERY    . 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 s/p Breast biopsy  1992  . s/p EGD  2007   with Botox for? Achalasia  . TONSILLECTOMY    . TUBAL LIGATION     Past Medical History:  Diagnosis Date  . ABDOMINAL DISTENSION 10/11/2008  . Acute gouty arthropathy 12/11/2008  . Altered mental status 02/07/2010  . AMPUTATION, BELOW KNEE, RIGHT, HX OF 01/28/2009  . ANAPHYLACTIC SHOCK 05/15/2009  . ANEMIA-NOS 07/17/2008  . ANXIETY 07/17/2008  . ARTHRITIS 01/28/2009  . B12 DEFICIENCY 07/17/2008  . Cellulitis and abscess of leg, except foot 11/15/2008  . CHF 01/28/2009  . Chronic pain syndrome 02/14/2010  . COLONIC POLYPS, HX OF 07/17/2008  . DIABETES MELLITUS, TYPE II 07/17/2008  . Dysuria 11/11/2009  . FOOT PAIN 07/17/2008  . GERD 07/17/2008  . HEARING LOSS, RIGHT EAR 06/04/2009  . HYPERKALEMIA 10/31/2009  . Hyperlipemia 09/24/2010  . Hyperlipidemia 09/24/2010  . HYPERSOMNIA 02/14/2010  . HYPERTENSION  07/17/2008  . HYPONATREMIA 03/20/2010  . Hypoxemia 03/20/2010  . IBS 06/04/2009  . MENOPAUSAL DISORDER 12/03/2009  . NEUROPATHY, HX OF 01/28/2009  . Osteoporosis 11/16/2014  . PERIPHERAL EDEMA 08/30/2008  . Pneumonia, aspiration (HCC) 12/01/2011   Episode July 2013, tx per [facility]  . Pneumonia, organism unspecified(486) 02/14/2010  . RENAL INSUFFICIENCY 10/31/2009  . Rheumatoid arthritis(714.0) 07/17/2008  . Seizure (HCC) 10/26/2010  . SKIN LESION 06/04/2009  . SKIN ULCER, CHRONIC 02/08/2009  . Spinal stenosis of lumbar region 05/15/2015  . Trigeminal neuralgia 02/14/2010  . UNSPECIFIED HEPATITIS 01/28/2009  . Wheezing 12/03/2009   There were no vitals taken for this visit.  Opioid Risk Score:   Fall Risk Score:  `1  Depression  screen PHQ 2/9  Depression screen [facility] 2/9 10/07/2016 01/27/2016 12/26/2015 09/17/2015 02/25/2015 02/12/2015 01/15/2015  Decreased Interest 0 0 0 0 1 0 0  Down, Depressed, Hopeless 0 0 0 0 1 0 0  PHQ - 2 Score 0 0 0 0 2 0 0  Altered sleeping - - - - 3 - -  Tired, decreased energy - - - - 3 - -  Change in appetite - - - - - - -  Feeling bad or failure about yourself  - - - - - - -  Trouble concentrating - - - - 0 - -  Moving slowly or fidgety/restless - - - - 0 - -  Suicidal thoughts - - - - 0 - -  PHQ-9 Score - - - - 8 - -  Difficult doing work/chores - - - - Not difficult at all - -  Some recent data might be hidden     Review of Systems  Constitutional: Negative.   HENT: Negative.   Eyes: Negative.   Respiratory: Negative.   Cardiovascular: Negative.   Gastrointestinal: Negative.   Endocrine: Negative.   Genitourinary: Positive for difficulty urinating, dyspareunia and dysuria.  Musculoskeletal: Negative.   Skin: Negative.   Allergic/Immunologic: Negative.   Neurological: Negative.   Hematological: Negative.   Psychiatric/Behavioral: Negative.   All other systems reviewed and are negative.      Objective:   Physical Exam  Constitutional: She is oriented to person, place, and time. She appears well-developed and well-nourished.  Morbid Obesity  HENT:  Head: Normocephalic and atraumatic.  Neck: Normal range of motion. Neck supple.  Cardiovascular: Normal rate and regular rhythm.   Pulmonary/Chest: Effort normal and breath sounds normal.  Musculoskeletal:  Normal Muscle Bulk and Muscle Testing Reveals:  Upper Extremities: Full ROM and Muscle Strength 5/5 Lumbar Paraspinal Tenderness: L-4-L-5 Left: Greater Trochanter Tenderness Lower Extremities: Full ROM and Muscle Strength 5/5 Arrived in Wheelchair   Neurological: She is alert and oriented to person, place, and time.  Skin:  Stage II Pressure Ulcer Noted on Left Buttock/ Wound Care following  Psychiatric: She has a  normal mood and affect.  Nursing note and vitals reviewed.         Assessment & Plan:  1.Right below-knee amputation, history of phantom limb pain.: Continue Current Medication Regime and Continue to Monitor. 01/04/2017 2. Peripheral neuropathy: Continue Gabapentin and Cymbalta. 01/04/2017 Refilled: Fenatnyl Patch 75 mcg one patch every three days #10  and Continue Oxycodone 10/325mg  one tablet every 6 hours as needed #120.  We will continue the opioid monitoring program, this consists of regular clinic visits, examinations, urine drug screen, pill counts as well as use of [place] Controlled Substance Reporting System. 3. History of rheumatoid arthritis.  Continue Current Medication and Exercise and heat Therapy. 01/04/2017 4. Osteoarthritis of left knee: Continue with Voltaren gel/ Exercise and heat therapy. 01/04/2017 5. Chronic cellulitis, left leg with associated edema:PCP Following/ Attending [facility] weekly and Brad changing Dressing twice a Week.[name] 01/04/2017 6. Lumbar Spondylosis: Continue current medication regime. Continue HEP.01/04/2017 7. Bilateral Shoulder Pain: Continue HEP as Tolerated. 01/04/2017 8. Muscle Spasm: Continue Flexeril. 12/07/2016 9. Stage @ Pressure Ulcer: [facility] Following.   20 minutes of face to face patient care time was spent during this visit. All questions were encouraged and answered.   F/U in 1 month

## 2017-01-06 ENCOUNTER — Telehealth: Payer: Self-pay | Admitting: *Deleted

## 2017-01-06 NOTE — Telephone Encounter (Addendum)
Crystal Fitzpatrick called and is having an issue getting her topamax refilled.  She is switching PCP from [name]. She is taking 300 mg at HS,  In looking through the chart for why [name] was prescribing-- it appears [name] initially prescribed this dose at bedtime. [phone]) I will forward to him and see if he will agree to resume prescribing.

## 2017-01-07 MED ORDER — TOPIRAMATE 100 MG PO TABS: 300.0000 mg | ORAL_TABLET | Freq: Every day | ORAL | 3 refills | Status: DC

## 2017-01-07 NOTE — Telephone Encounter (Signed)
[place] notified.

## 2017-01-07 NOTE — Telephone Encounter (Signed)
We may resume. I refilled for her already. thanks.

## 2017-01-08 ENCOUNTER — Other Ambulatory Visit: Payer: Self-pay | Admitting: Registered Nurse

## 2017-01-08 ENCOUNTER — Telehealth: Payer: Self-pay | Admitting: Physical Medicine & Rehabilitation

## 2017-01-08 NOTE — Telephone Encounter (Signed)
PATIENT CALLED OFFICE AND THANKS ZS AND SYBIL TO THANK YOU BOTH FOR THE HELP WITH GETTING TOPAMAX RX ARRANGED

## 2017-02-02 DIAGNOSIS — L300 Nummular dermatitis: Secondary | ICD-10-CM | POA: Diagnosis not present

## 2017-02-03 ENCOUNTER — Encounter: Payer: Self-pay | Admitting: Physical Medicine & Rehabilitation

## 2017-02-03 ENCOUNTER — Encounter: Payer: Medicare Other | Attending: Physical Medicine and Rehabilitation | Admitting: Physical Medicine & Rehabilitation

## 2017-02-03 ENCOUNTER — Telehealth: Payer: Self-pay | Admitting: Internal Medicine

## 2017-02-03 VITALS — BP 145/78 | HR 67 | Resp 14

## 2017-02-03 DIAGNOSIS — L03119 Cellulitis of unspecified part of limb: Secondary | ICD-10-CM

## 2017-02-03 DIAGNOSIS — M179 Osteoarthritis of knee, unspecified: Secondary | ICD-10-CM | POA: Insufficient documentation

## 2017-02-03 DIAGNOSIS — G546 Phantom limb syndrome with pain: Secondary | ICD-10-CM

## 2017-02-03 DIAGNOSIS — G609 Hereditary and idiopathic neuropathy, unspecified: Secondary | ICD-10-CM

## 2017-02-03 DIAGNOSIS — M4716 Other spondylosis with myelopathy, lumbar region: Secondary | ICD-10-CM | POA: Insufficient documentation

## 2017-02-03 DIAGNOSIS — M48061 Spinal stenosis, lumbar region without neurogenic claudication: Secondary | ICD-10-CM

## 2017-02-03 DIAGNOSIS — M47816 Spondylosis without myelopathy or radiculopathy, lumbar region: Secondary | ICD-10-CM

## 2017-02-03 DIAGNOSIS — L02419 Cutaneous abscess of limb, unspecified: Secondary | ICD-10-CM

## 2017-02-03 DIAGNOSIS — Z89519 Acquired absence of unspecified leg below knee: Secondary | ICD-10-CM

## 2017-02-03 DIAGNOSIS — M069 Rheumatoid arthritis, unspecified: Secondary | ICD-10-CM | POA: Diagnosis not present

## 2017-02-03 MED ORDER — FENTANYL 75 MCG/HR TD PT72: 75.0000 ug | MEDICATED_PATCH | TRANSDERMAL | 0 refills | Status: DC

## 2017-02-03 MED ORDER — OXYCODONE-ACETAMINOPHEN 10-325 MG PO TABS: 1.0000 | ORAL_TABLET | Freq: Four times a day (QID) | ORAL | 0 refills | Status: DC | PRN

## 2017-02-03 NOTE — Progress Notes (Signed)
Subjective:    Patient ID: Crystal Fitzpatrick, female    DOB: 11-Apr-1946, 71 y.o.   MRN: [id number]  HPI   Crystal Fitzpatrick is here regarding her chronic pain and functional deficits. She is still going to [facility] wound care clinic for her left leg and buttock as well.  She is frustrated that her wound on the leg is not healing and continues to drain despite regular follow up. She is wearing a coban wrap currently for compression purposes. She feels that the buttock wound is healing gradually though. She tells me that more recent testing has been done to assess her circulation, and she was told the tests looked "good". The last ABI I could find is from 2012 and was 1.1 for her LLE  From a pain standpoint, she's fairly stable. Pain is most severe in her right knee/low back, multiple peripheral joints. She continues on fentanyl and percocet as prescribed. She has been compliant with our program.     Pain Inventory Average Pain 7 Pain Right Now 7 My pain is sharp, burning, stabbing and aching  In the last 24 hours, has pain interfered with the following? General activity 5 Relation with others 7 Enjoyment of life 6 What TIME of day is your pain at its worst? morning, night Sleep (in general) Poor  Pain is worse with: walking, sitting, inactivity and standing Pain improves with: rest and medication Relief from Meds: 5  Mobility use a wheelchair  Function disabled: date disabled .  Neuro/Psych trouble walking  Prior Studies Any changes since last visit?  no  Physicians involved in your care Any changes since last visit?  no   Family History  Problem Relation Age of Onset  . Breast cancer Sister   . Diabetes Other   . Stroke Other        Grandparents   Social History   Social History  . Marital status: Divorced    Spouse name: N/A  . Number of children: 1  . Years of education: N/A   Occupational History  . retired Marketing executive Retired   Social  History Main Topics  . Smoking status: Former Games developer  . Smokeless tobacco: Never Used  . Alcohol use No  . Drug use: No  . Sexual activity: Not Asked   Other Topics Concern  . None   Social History Narrative  . None   Past Surgical History:  Procedure Laterality Date  . AMPUTATION  02/02/2008   below right knee  . foot surgury  1980 and 1981  . NASAL SEPTUM SURGERY    . 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 s/p Breast biopsy  1992  . s/p EGD  2007   with Botox for? Achalasia  . TONSILLECTOMY    . TUBAL LIGATION     Past Medical History:  Diagnosis Date  . ABDOMINAL DISTENSION 10/11/2008  . Acute gouty arthropathy 12/11/2008  . Altered mental status 02/07/2010  . AMPUTATION, BELOW KNEE, RIGHT, HX OF 01/28/2009  . ANAPHYLACTIC SHOCK 05/15/2009  . ANEMIA-NOS 07/17/2008  . ANXIETY 07/17/2008  . ARTHRITIS 01/28/2009  . B12 DEFICIENCY 07/17/2008  . Cellulitis and abscess of leg, except foot 11/15/2008  . CHF 01/28/2009  . Chronic pain syndrome 02/14/2010  . COLONIC POLYPS, HX OF 07/17/2008  . DIABETES MELLITUS, TYPE II 07/17/2008  . Dysuria 11/11/2009  . FOOT PAIN 07/17/2008  . GERD 07/17/2008  . HEARING LOSS, RIGHT EAR 06/04/2009  . HYPERKALEMIA  10/31/2009  . Hyperlipemia 09/24/2010  . Hyperlipidemia 09/24/2010  . HYPERSOMNIA 02/14/2010  . HYPERTENSION 07/17/2008  . HYPONATREMIA 03/20/2010  . Hypoxemia 03/20/2010  . IBS 06/04/2009  . MENOPAUSAL DISORDER 12/03/2009  . NEUROPATHY, HX OF 01/28/2009  . Osteoporosis 11/16/2014  . PERIPHERAL EDEMA 08/30/2008  . Pneumonia, aspiration (HCC) 12/01/2011   Episode July 2013, tx per [facility]  . Pneumonia, organism unspecified(486) 02/14/2010  . RENAL INSUFFICIENCY 10/31/2009  . Rheumatoid arthritis(714.0) 07/17/2008  . Seizure (HCC) 10/26/2010  . SKIN LESION 06/04/2009  . SKIN ULCER, CHRONIC 02/08/2009  . Spinal stenosis of lumbar region 05/15/2015  . Trigeminal neuralgia 02/14/2010  . UNSPECIFIED HEPATITIS 01/28/2009  . Wheezing 12/03/2009    BP (!) 145/78   Pulse 67   Resp 14   SpO2 93%   Opioid Risk Score:   Fall Risk Score:  `1  Depression screen PHQ 2/9  Depression screen [facility] 2/9 10/07/2016 01/27/2016 12/26/2015 09/17/2015 02/25/2015 02/12/2015 01/15/2015  Decreased Interest 0 0 0 0 1 0 0  Down, Depressed, Hopeless 0 0 0 0 1 0 0  PHQ - 2 Score 0 0 0 0 2 0 0  Altered sleeping - - - - 3 - -  Tired, decreased energy - - - - 3 - -  Change in appetite - - - - - - -  Feeling bad or failure about yourself  - - - - - - -  Trouble concentrating - - - - 0 - -  Moving slowly or fidgety/restless - - - - 0 - -  Suicidal thoughts - - - - 0 - -  PHQ-9 Score - - - - 8 - -  Difficult doing work/chores - - - - Not difficult at all - -  Some recent data might be hidden    Review of Systems  Constitutional: Negative.   HENT: Negative.   Eyes: Negative.   Respiratory: Negative.   Cardiovascular: Negative.   Gastrointestinal: Negative.   Endocrine: Negative.   Genitourinary: Negative.   Musculoskeletal: Positive for arthralgias and myalgias.  Skin: Positive for wound.  Allergic/Immunologic: Negative.   Neurological: Negative.   Hematological: Negative.   Psychiatric/Behavioral: Negative.        Objective:   Physical Exam  Constitutional: She appears well-developed and well-nourished. She has lost weight but remains obese  HENT: some facial tenderness and hypertensitivity  Head: Normocephalic and atraumatic.  Nose: Nose normal.  Mouth/Throat: Oropharynx is clear and moist.  Eyes: PERRL  Neck: Normal range of motion. Neck supple.  Cardiovascular: RRR Pulmonary/Chest: clear, normal effort  Musculoskeletal: minimal low back tenderness  LLE in unna dressing.  Neurological: A sensory deficitto light touch left foot/leg, right leg to PP and LT.   UE's: 4/5 deltoids, biceps,triceps 4/5. LLE: 4/5 HF, 3+KE 2/5 ADF/PF. Cognitively she's intact Skin: unna dressing LLE with weeping into shoe/sock from the dorsum of the foot.  Toes warm, skin of normal color on foot.  Psychiatric: Normal mood.     Assessment & Plan:  1.Right below-knee amputation, history of phantom limb pain.: Continue Current Medication Regime as below             -ultimately would benefit from a powered device to use for mobility outside the house 2. Peripheral neuropathy: Continue Gabapentin.  Refilled: Fentanyl Patch one patch every three days #10. and Oxycodone 10/325mg  one tablet every 6 hours as needed #120.   -We will continue the opioid monitoring program, this consists of regular  clinic visits, examinations, routine drug screening, pill counts as well as use of [place] Controlled Substance Reporting System. NCCSRS was reviewed today.   3. History of rheumatoid arthritis. Continue Current Medication and Exercise and heat Therapy.  4. Osteoarthritis of left knee: Continue with Voltaren gel/ Exercise and heat therapy.  5. Chronic cellulitis, left leg wound with associated edema/buttock wound:             -continue [facility] wound care clinic mgt 6. Lumbar Spondylosis: continue HEP.     15 minutes of face to face patient care time was spent during this visit. All questions were encouraged and answered. WE will see her in follow up in about a month

## 2017-02-03 NOTE — Telephone Encounter (Signed)
Dr Clent Ridges pt said thank so much for accepting her she really appreciate it to the fullest state that you are so sweet.

## 2017-02-03 NOTE — Telephone Encounter (Signed)
Pt would like to transfer from Dr. Jonny Ruiz to Dr. Clent Ridges due to the office being close to her house.  Is it okay for the transfer?

## 2017-02-03 NOTE — Telephone Encounter (Signed)
Yes I can see her  

## 2017-02-03 NOTE — Patient Instructions (Signed)
PLEASE FEEL FREE TO CALL OUR OFFICE WITH ANY PROBLEMS OR QUESTIONS ([phone])      

## 2017-02-03 NOTE — Telephone Encounter (Signed)
Ok with me 

## 2017-02-04 ENCOUNTER — Other Ambulatory Visit: Payer: Self-pay | Admitting: Physical Medicine & Rehabilitation

## 2017-03-02 DIAGNOSIS — I8312 Varicose veins of left lower extremity with inflammation: Secondary | ICD-10-CM | POA: Diagnosis not present

## 2017-03-04 ENCOUNTER — Encounter: Payer: Medicare Other | Admitting: Registered Nurse

## 2017-03-04 ENCOUNTER — Encounter: Payer: Medicare Other | Attending: Physical Medicine and Rehabilitation | Admitting: Registered Nurse

## 2017-03-04 ENCOUNTER — Encounter: Payer: Self-pay | Admitting: Registered Nurse

## 2017-03-04 VITALS — BP 102/66 | HR 93

## 2017-03-04 DIAGNOSIS — G894 Chronic pain syndrome: Secondary | ICD-10-CM

## 2017-03-04 DIAGNOSIS — G546 Phantom limb syndrome with pain: Secondary | ICD-10-CM

## 2017-03-04 DIAGNOSIS — M4716 Other spondylosis with myelopathy, lumbar region: Secondary | ICD-10-CM | POA: Insufficient documentation

## 2017-03-04 DIAGNOSIS — L02419 Cutaneous abscess of limb, unspecified: Secondary | ICD-10-CM

## 2017-03-04 DIAGNOSIS — Z89519 Acquired absence of unspecified leg below knee: Secondary | ICD-10-CM

## 2017-03-04 DIAGNOSIS — M48061 Spinal stenosis, lumbar region without neurogenic claudication: Secondary | ICD-10-CM | POA: Diagnosis not present

## 2017-03-04 DIAGNOSIS — M25511 Pain in right shoulder: Secondary | ICD-10-CM | POA: Diagnosis not present

## 2017-03-04 DIAGNOSIS — Z5181 Encounter for therapeutic drug level monitoring: Secondary | ICD-10-CM

## 2017-03-04 DIAGNOSIS — G8929 Other chronic pain: Secondary | ICD-10-CM | POA: Diagnosis not present

## 2017-03-04 DIAGNOSIS — G609 Hereditary and idiopathic neuropathy, unspecified: Secondary | ICD-10-CM | POA: Insufficient documentation

## 2017-03-04 DIAGNOSIS — M069 Rheumatoid arthritis, unspecified: Secondary | ICD-10-CM | POA: Diagnosis not present

## 2017-03-04 DIAGNOSIS — M179 Osteoarthritis of knee, unspecified: Secondary | ICD-10-CM | POA: Insufficient documentation

## 2017-03-04 DIAGNOSIS — Z79899 Other long term (current) drug therapy: Secondary | ICD-10-CM

## 2017-03-04 DIAGNOSIS — L03119 Cellulitis of unspecified part of limb: Secondary | ICD-10-CM | POA: Diagnosis not present

## 2017-03-04 MED ORDER — FENTANYL 75 MCG/HR TD PT72: 75.0000 ug | MEDICATED_PATCH | TRANSDERMAL | 0 refills | Status: DC

## 2017-03-04 MED ORDER — OXYCODONE-ACETAMINOPHEN 10-325 MG PO TABS: 1.0000 | ORAL_TABLET | Freq: Four times a day (QID) | ORAL | 0 refills | Status: DC | PRN

## 2017-03-04 NOTE — Progress Notes (Signed)
Subjective:    Patient ID: Crystal Fitzpatrick, female    DOB: 1945-05-29, 71 y.o.   MRN: [id number]  HPI:  Crystal Fitzpatrick is a 71year old female who returns for follow up appointmentfor chronic pain and medication refill. She states her pain is located in her right shoulder, lower back,right stump pain (phantom)and left lower extremity   dressing intact ( Coban Wrap) .She rates her pain 6. Her current exercise regime is performing stretching exercises daily (Yoga).  Ms. Pizzini Morphine equivalent is 240.00 MME.  Ms. Abeln  has Stage II pressure ulcer noted. [facility] Following, they referred her to Dr. Katrinka Blazing Dermatologist she seen him on 03/02/2017. She reports she was instructed she needs an amputation. She states she will seek a second opinion then she will be able to make a decision.  Also receiving [facility] weekly on Left lower extremity and Brad changing dressing twice a week, notes was reviewed, dressing intact.  Oral Swab was performed on 11/09/2016, it was consistent.   Pain Inventory Average Pain 7 Pain Right Now 6 My pain is sharp, burning, stabbing and aching  In the last 24 hours, has pain interfered with the following? General activity 6 Relation with others 3 Enjoyment of life 2 What TIME of day is your pain at its worst? morning and night Sleep (in general) Poor  Pain is worse with: sitting and inactivity Pain improves with: rest, heat/ice, therapy/exercise, pacing activities and medication Relief from Meds: 6  Mobility Do you have any goals in this area?  no  Function Do you have any goals in this area?  no  Neuro/Psych bladder control problems  Prior Studies Any changes since last visit?  no  Physicians involved in your care Any changes since last visit?  no   Family History  Problem Relation Age of Onset  . Breast cancer Sister   . Diabetes Other   . Stroke Other        Grandparents   Social  History   Social History  . Marital status: Divorced    Spouse name: N/A  . Number of children: 1  . Years of education: N/A   Occupational History  . retired Marketing executive Retired   Social History Main Topics  . Smoking status: Former Games developer  . Smokeless tobacco: Never Used  . Alcohol use No  . Drug use: No  . Sexual activity: Not Asked   Other Topics Concern  . None   Social History Narrative  . None   Past Surgical History:  Procedure Laterality Date  . AMPUTATION  02/02/2008   below right knee  . foot surgury  1980 and 1981  . NASAL SEPTUM SURGERY    . 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 s/p Breast biopsy  1992  . s/p EGD  2007   with Botox for? Achalasia  . TONSILLECTOMY    . TUBAL LIGATION     Past Medical History:  Diagnosis Date  . ABDOMINAL DISTENSION 10/11/2008  . Acute gouty arthropathy 12/11/2008  . Altered mental status 02/07/2010  . AMPUTATION, BELOW KNEE, RIGHT, HX OF 01/28/2009  . ANAPHYLACTIC SHOCK 05/15/2009  . ANEMIA-NOS 07/17/2008  . ANXIETY 07/17/2008  . ARTHRITIS 01/28/2009  . B12 DEFICIENCY 07/17/2008  . Cellulitis and abscess of leg, except foot 11/15/2008  . CHF 01/28/2009  . Chronic pain syndrome 02/14/2010  . COLONIC POLYPS, HX OF  07/17/2008  . DIABETES MELLITUS, TYPE II 07/17/2008  . Dysuria 11/11/2009  . FOOT PAIN 07/17/2008  . GERD 07/17/2008  . HEARING LOSS, RIGHT EAR 06/04/2009  . HYPERKALEMIA 10/31/2009  . Hyperlipemia 09/24/2010  . Hyperlipidemia 09/24/2010  . HYPERSOMNIA 02/14/2010  . HYPERTENSION 07/17/2008  . HYPONATREMIA 03/20/2010  . Hypoxemia 03/20/2010  . IBS 06/04/2009  . MENOPAUSAL DISORDER 12/03/2009  . NEUROPATHY, HX OF 01/28/2009  . Osteoporosis 11/16/2014  . PERIPHERAL EDEMA 08/30/2008  . Pneumonia, aspiration (HCC) 12/01/2011   Episode July 2013, tx per [facility]  . Pneumonia, organism unspecified(486) 02/14/2010  . RENAL INSUFFICIENCY 10/31/2009  . Rheumatoid arthritis(714.0) 07/17/2008  . Seizure  (HCC) 10/26/2010  . SKIN LESION 06/04/2009  . SKIN ULCER, CHRONIC 02/08/2009  . Spinal stenosis of lumbar region 05/15/2015  . Trigeminal neuralgia 02/14/2010  . UNSPECIFIED HEPATITIS 01/28/2009  . Wheezing 12/03/2009   BP 102/66   Pulse 93   SpO2 91%   Opioid Risk Score:  1 Fall Risk Score:  `1  Depression screen PHQ 2/9  Depression screen [facility] 2/9 03/04/2017 10/07/2016 01/27/2016 12/26/2015 09/17/2015 02/25/2015 02/12/2015  Decreased Interest 1 0 0 0 0 1 0  Down, Depressed, Hopeless 1 0 0 0 0 1 0  PHQ - 2 Score 2 0 0 0 0 2 0  Altered sleeping - - - - - 3 -  Tired, decreased energy - - - - - 3 -  Change in appetite - - - - - - -  Feeling bad or failure about yourself  - - - - - - -  Trouble concentrating - - - - - 0 -  Moving slowly or fidgety/restless - - - - - 0 -  Suicidal thoughts - - - - - 0 -  PHQ-9 Score - - - - - 8 -  Difficult doing work/chores - - - - - Not difficult at all -  Some recent data might be hidden     Review of Systems  Constitutional: Negative.   HENT: Negative.   Eyes: Negative.   Respiratory: Negative.   Cardiovascular: Negative.   Gastrointestinal: Negative.   Endocrine: Negative.   Genitourinary: Positive for difficulty urinating, dyspareunia and dysuria.  Musculoskeletal: Negative.   Skin: Negative.   Allergic/Immunologic: Negative.   Neurological: Negative.   Hematological: Negative.   Psychiatric/Behavioral: Negative.   All other systems reviewed and are negative.      Objective:   Physical Exam  Constitutional: She is oriented to person, place, and time. She appears well-developed and well-nourished.  Morbid Obesity  HENT:  Head: Normocephalic and atraumatic.  Neck: Normal range of motion. Neck supple.  Cardiovascular: Normal rate and regular rhythm.   Pulmonary/Chest: Effort normal and breath sounds normal.  Musculoskeletal:  Normal Muscle Bulk and Muscle Testing Reveals:  Upper Extremities: Full ROM and Muscle Strength 5/5 Thoracic  Hypersensitivity Lumbar Paraspinal Tenderness: L-4-L-5  Lower Extremities: Right: BKA Left:  Full ROM and Muscle Strength 5/5: Left Lower Extremity Dressing Intact Arrived in Wheelchair   Neurological: She is alert and oriented to person, place, and time.  Skin:  Stage II Pressure Ulcer Noted on Left Buttock/ Wound Care following  Psychiatric: She has a normal mood and affect.  Nursing note and vitals reviewed.         Assessment & Plan:  1.Right below-knee amputation, history of phantom limb pain.: Continue Current Medication Regime and Continue to Monitor. 03/04/2017 2. Peripheral neuropathy: Continue Gabapentin and Cymbalta. 03/04/2017 Refilled: Lindi Adie  Patch 75 mcg one patch every three days #10  and Continue Oxycodone 10/325mg  one tablet every 6 hours as needed #120.  We will continue the opioid monitoring program, this consists of regular clinic visits, examinations, urine drug screen, pill counts as well as use of [place] Controlled Substance Reporting System. 3. History of rheumatoid arthritis. Continue Current Medication and Exercise and heat Therapy. 03/04/2017 4. Osteoarthritis of left knee: Continue with Voltaren gel/ Exercise and heat therapy. 03/04/2017 5. Chronic cellulitis, left leg with associated edema:PCP Following/ Attending [facility] weekly and Brad changing Dressing twice a Week.. 03/04/2017 6. Lumbar Spondylosis: Continue current medication regime. Continue HEP.03/04/2017 7. Bilateral Shoulder Pain: No complaints Today. Continue HEP as Tolerated. 03/04/2017 8. Muscle Spasm: Continue Flexeril. 03/04/2017 9. Stage II Pressure Ulcer: [facility] Following.   20 minutes of face to face patient care time was spent during this visit. All questions were encouraged and answered.   F/U in 1 month

## 2017-03-05 ENCOUNTER — Telehealth: Payer: Self-pay | Admitting: Registered Nurse

## 2017-03-05 NOTE — Telephone Encounter (Signed)
On 03/04/2017 the  NCCSR was reviewed no conflict was seen on the [facility] Controlled Substance Reporting System with multiple prescribers. Ms. Lisby has a signed narcotic contract with our office. If there were any discrepancies this would have been reported to her physician.

## 2017-03-08 DIAGNOSIS — N289 Disorder of kidney and ureter, unspecified: Secondary | ICD-10-CM | POA: Diagnosis not present

## 2017-03-08 DIAGNOSIS — L89323 Pressure ulcer of left buttock, stage 3: Secondary | ICD-10-CM | POA: Diagnosis not present

## 2017-03-08 DIAGNOSIS — L97829 Non-pressure chronic ulcer of other part of left lower leg with unspecified severity: Secondary | ICD-10-CM | POA: Diagnosis not present

## 2017-03-08 DIAGNOSIS — L97922 Non-pressure chronic ulcer of unspecified part of left lower leg with fat layer exposed: Secondary | ICD-10-CM | POA: Diagnosis not present

## 2017-03-08 DIAGNOSIS — M7989 Other specified soft tissue disorders: Secondary | ICD-10-CM | POA: Diagnosis not present

## 2017-03-08 DIAGNOSIS — M79673 Pain in unspecified foot: Secondary | ICD-10-CM | POA: Diagnosis not present

## 2017-03-08 DIAGNOSIS — L03116 Cellulitis of left lower limb: Secondary | ICD-10-CM | POA: Diagnosis not present

## 2017-03-08 DIAGNOSIS — R030 Elevated blood-pressure reading, without diagnosis of hypertension: Secondary | ICD-10-CM | POA: Diagnosis not present

## 2017-03-08 DIAGNOSIS — N179 Acute kidney failure, unspecified: Secondary | ICD-10-CM | POA: Diagnosis not present

## 2017-03-08 DIAGNOSIS — I130 Hypertensive heart and chronic kidney disease with heart failure and stage 1 through stage 4 chronic kidney disease, or unspecified chronic kidney disease: Secondary | ICD-10-CM | POA: Diagnosis not present

## 2017-03-08 DIAGNOSIS — I10 Essential (primary) hypertension: Secondary | ICD-10-CM | POA: Diagnosis not present

## 2017-03-08 DIAGNOSIS — E875 Hyperkalemia: Secondary | ICD-10-CM | POA: Diagnosis not present

## 2017-03-08 DIAGNOSIS — E11621 Type 2 diabetes mellitus with foot ulcer: Secondary | ICD-10-CM | POA: Diagnosis not present

## 2017-03-08 DIAGNOSIS — D649 Anemia, unspecified: Secondary | ICD-10-CM | POA: Diagnosis not present

## 2017-03-08 DIAGNOSIS — L89313 Pressure ulcer of right buttock, stage 3: Secondary | ICD-10-CM | POA: Diagnosis not present

## 2017-03-09 DIAGNOSIS — L89892 Pressure ulcer of other site, stage 2: Secondary | ICD-10-CM | POA: Diagnosis present

## 2017-03-09 DIAGNOSIS — L97829 Non-pressure chronic ulcer of other part of left lower leg with unspecified severity: Secondary | ICD-10-CM | POA: Diagnosis present

## 2017-03-09 DIAGNOSIS — I83029 Varicose veins of left lower extremity with ulcer of unspecified site: Secondary | ICD-10-CM | POA: Diagnosis not present

## 2017-03-09 DIAGNOSIS — M810 Age-related osteoporosis without current pathological fracture: Secondary | ICD-10-CM | POA: Diagnosis present

## 2017-03-09 DIAGNOSIS — S81802A Unspecified open wound, left lower leg, initial encounter: Secondary | ICD-10-CM | POA: Diagnosis not present

## 2017-03-09 DIAGNOSIS — N183 Chronic kidney disease, stage 3 (moderate): Secondary | ICD-10-CM | POA: Diagnosis present

## 2017-03-09 DIAGNOSIS — L89323 Pressure ulcer of left buttock, stage 3: Secondary | ICD-10-CM | POA: Diagnosis present

## 2017-03-09 DIAGNOSIS — G894 Chronic pain syndrome: Secondary | ICD-10-CM | POA: Diagnosis present

## 2017-03-09 DIAGNOSIS — L89313 Pressure ulcer of right buttock, stage 3: Secondary | ICD-10-CM | POA: Diagnosis present

## 2017-03-09 DIAGNOSIS — I83028 Varicose veins of left lower extremity with ulcer other part of lower leg: Secondary | ICD-10-CM | POA: Diagnosis present

## 2017-03-09 DIAGNOSIS — K589 Irritable bowel syndrome without diarrhea: Secondary | ICD-10-CM | POA: Diagnosis present

## 2017-03-09 DIAGNOSIS — L03116 Cellulitis of left lower limb: Secondary | ICD-10-CM | POA: Diagnosis present

## 2017-03-09 DIAGNOSIS — E1122 Type 2 diabetes mellitus with diabetic chronic kidney disease: Secondary | ICD-10-CM | POA: Diagnosis present

## 2017-03-09 DIAGNOSIS — R5381 Other malaise: Secondary | ICD-10-CM | POA: Diagnosis present

## 2017-03-09 DIAGNOSIS — M1990 Unspecified osteoarthritis, unspecified site: Secondary | ICD-10-CM | POA: Diagnosis present

## 2017-03-09 DIAGNOSIS — L8945 Pressure ulcer of contiguous site of back, buttock and hip, unstageable: Secondary | ICD-10-CM | POA: Diagnosis not present

## 2017-03-09 DIAGNOSIS — E538 Deficiency of other specified B group vitamins: Secondary | ICD-10-CM | POA: Diagnosis present

## 2017-03-09 DIAGNOSIS — G8929 Other chronic pain: Secondary | ICD-10-CM | POA: Diagnosis not present

## 2017-03-09 DIAGNOSIS — I10 Essential (primary) hypertension: Secondary | ICD-10-CM | POA: Diagnosis not present

## 2017-03-09 DIAGNOSIS — F419 Anxiety disorder, unspecified: Secondary | ICD-10-CM | POA: Diagnosis present

## 2017-03-09 DIAGNOSIS — L97929 Non-pressure chronic ulcer of unspecified part of left lower leg with unspecified severity: Secondary | ICD-10-CM | POA: Diagnosis not present

## 2017-03-09 DIAGNOSIS — Z89511 Acquired absence of right leg below knee: Secondary | ICD-10-CM | POA: Diagnosis not present

## 2017-03-09 DIAGNOSIS — L89109 Pressure ulcer of unspecified part of back, unspecified stage: Secondary | ICD-10-CM | POA: Diagnosis present

## 2017-03-09 DIAGNOSIS — J309 Allergic rhinitis, unspecified: Secondary | ICD-10-CM | POA: Diagnosis present

## 2017-03-09 DIAGNOSIS — N179 Acute kidney failure, unspecified: Secondary | ICD-10-CM | POA: Diagnosis present

## 2017-03-09 DIAGNOSIS — Z7902 Long term (current) use of antithrombotics/antiplatelets: Secondary | ICD-10-CM | POA: Diagnosis not present

## 2017-03-09 DIAGNOSIS — I509 Heart failure, unspecified: Secondary | ICD-10-CM | POA: Diagnosis present

## 2017-03-09 DIAGNOSIS — I129 Hypertensive chronic kidney disease with stage 1 through stage 4 chronic kidney disease, or unspecified chronic kidney disease: Secondary | ICD-10-CM | POA: Diagnosis not present

## 2017-03-09 DIAGNOSIS — Z8673 Personal history of transient ischemic attack (TIA), and cerebral infarction without residual deficits: Secondary | ICD-10-CM | POA: Diagnosis not present

## 2017-03-09 DIAGNOSIS — E1142 Type 2 diabetes mellitus with diabetic polyneuropathy: Secondary | ICD-10-CM | POA: Diagnosis present

## 2017-03-09 DIAGNOSIS — K219 Gastro-esophageal reflux disease without esophagitis: Secondary | ICD-10-CM | POA: Diagnosis present

## 2017-03-09 DIAGNOSIS — E7849 Other hyperlipidemia: Secondary | ICD-10-CM | POA: Diagnosis present

## 2017-03-09 DIAGNOSIS — I130 Hypertensive heart and chronic kidney disease with heart failure and stage 1 through stage 4 chronic kidney disease, or unspecified chronic kidney disease: Secondary | ICD-10-CM | POA: Diagnosis present

## 2017-03-11 ENCOUNTER — Other Ambulatory Visit: Payer: Self-pay | Admitting: Physical Medicine & Rehabilitation

## 2017-03-11 DIAGNOSIS — G609 Hereditary and idiopathic neuropathy, unspecified: Secondary | ICD-10-CM

## 2017-03-11 DIAGNOSIS — G546 Phantom limb syndrome with pain: Secondary | ICD-10-CM

## 2017-03-11 DIAGNOSIS — M4716 Other spondylosis with myelopathy, lumbar region: Secondary | ICD-10-CM

## 2017-03-29 DIAGNOSIS — I872 Venous insufficiency (chronic) (peripheral): Secondary | ICD-10-CM | POA: Diagnosis not present

## 2017-03-29 DIAGNOSIS — E11622 Type 2 diabetes mellitus with other skin ulcer: Secondary | ICD-10-CM | POA: Diagnosis not present

## 2017-03-29 DIAGNOSIS — I890 Lymphedema, not elsewhere classified: Secondary | ICD-10-CM | POA: Diagnosis not present

## 2017-03-29 DIAGNOSIS — L89322 Pressure ulcer of left buttock, stage 2: Secondary | ICD-10-CM | POA: Diagnosis not present

## 2017-03-29 DIAGNOSIS — L97522 Non-pressure chronic ulcer of other part of left foot with fat layer exposed: Secondary | ICD-10-CM | POA: Diagnosis not present

## 2017-03-29 DIAGNOSIS — L97222 Non-pressure chronic ulcer of left calf with fat layer exposed: Secondary | ICD-10-CM | POA: Diagnosis not present

## 2017-04-01 ENCOUNTER — Encounter: Payer: Self-pay | Admitting: Registered Nurse

## 2017-04-01 ENCOUNTER — Other Ambulatory Visit: Payer: Self-pay

## 2017-04-01 ENCOUNTER — Encounter: Payer: Medicare Other | Attending: Physical Medicine and Rehabilitation | Admitting: Registered Nurse

## 2017-04-01 VITALS — BP 134/80 | HR 75 | Resp 14

## 2017-04-01 DIAGNOSIS — G609 Hereditary and idiopathic neuropathy, unspecified: Secondary | ICD-10-CM

## 2017-04-01 DIAGNOSIS — G894 Chronic pain syndrome: Secondary | ICD-10-CM | POA: Diagnosis not present

## 2017-04-01 DIAGNOSIS — M069 Rheumatoid arthritis, unspecified: Secondary | ICD-10-CM | POA: Diagnosis not present

## 2017-04-01 DIAGNOSIS — L03119 Cellulitis of unspecified part of limb: Secondary | ICD-10-CM | POA: Diagnosis not present

## 2017-04-01 DIAGNOSIS — M179 Osteoarthritis of knee, unspecified: Secondary | ICD-10-CM | POA: Diagnosis not present

## 2017-04-01 DIAGNOSIS — Z5181 Encounter for therapeutic drug level monitoring: Secondary | ICD-10-CM

## 2017-04-01 DIAGNOSIS — G8929 Other chronic pain: Secondary | ICD-10-CM | POA: Diagnosis not present

## 2017-04-01 DIAGNOSIS — G546 Phantom limb syndrome with pain: Secondary | ICD-10-CM | POA: Diagnosis not present

## 2017-04-01 DIAGNOSIS — M25511 Pain in right shoulder: Secondary | ICD-10-CM

## 2017-04-01 DIAGNOSIS — M1712 Unilateral primary osteoarthritis, left knee: Secondary | ICD-10-CM

## 2017-04-01 DIAGNOSIS — M4716 Other spondylosis with myelopathy, lumbar region: Secondary | ICD-10-CM | POA: Diagnosis not present

## 2017-04-01 DIAGNOSIS — L02419 Cutaneous abscess of limb, unspecified: Secondary | ICD-10-CM | POA: Diagnosis not present

## 2017-04-01 DIAGNOSIS — M48061 Spinal stenosis, lumbar region without neurogenic claudication: Secondary | ICD-10-CM | POA: Diagnosis not present

## 2017-04-01 DIAGNOSIS — Z79899 Other long term (current) drug therapy: Secondary | ICD-10-CM | POA: Diagnosis not present

## 2017-04-01 DIAGNOSIS — Z89519 Acquired absence of unspecified leg below knee: Secondary | ICD-10-CM | POA: Diagnosis not present

## 2017-04-01 MED ORDER — FENTANYL 75 MCG/HR TD PT72: 75.0000 ug | MEDICATED_PATCH | TRANSDERMAL | 0 refills | Status: DC

## 2017-04-01 MED ORDER — OXYCODONE-ACETAMINOPHEN 10-325 MG PO TABS: 1.0000 | ORAL_TABLET | Freq: Four times a day (QID) | ORAL | 0 refills | Status: DC | PRN

## 2017-04-01 NOTE — Progress Notes (Signed)
Subjective:    Patient ID: Crystal Fitzpatrick, female    DOB: 1945-10-17, 71 y.o.   MRN: [id number]  HPI: Crystal Fitzpatrick is a 71year old female who returns for follow up appointmentfor chronic pain and medication refill. She states her pain is located in her neck,, right shoulder,lower back radiating down her bilateral lower extremities  ( on the Right down her right lower extremity into her stump and left into Left lower extremity into her foot) ,also reports right stump pain (phantom)and left lower extremity pain. Also reports her neuropathic pain has increased in intensity and asked for increase in her gabapentin. We discussed Tapentadol and patient information she will see if her insurance will cover medication and call office in a week with her decision.  [name] was recently admitted to [facility] on 03/08/17 and discharged 03/11/2017.  She was diagnosed with Renal insufficiency, hyperkalemia, cellulitis ( left lower extremity). With recent hospitalization and AKI we will check BMP prior to increasing gabapentin, she verbalizes understanding, notes were reviewed. Also Left lower extremity dressing intact ( Coban Wrap) .She rates her pain 5. Her current exercise regime is performing stretching exercises daily (Yoga).  [name] Morphine equivalent is 240.00 MME.  [name]  has Stage II pressure ulcer noted. [facility] Following, they referred her to Dr. Katrinka Blazing Dermatologist she seen him on 03/02/2017. She reports she was instructed she needs an amputation. [name] awaiting scheduled appointment for wound center at [facility] she reports.She states she wanted a second opinion.  At the present time she is receiving wound care at [facility] weekly on Left lower extremity and Brad changing dressing twice a week, notes was reviewed, dressing intact.  Oral Swab was performed on 11/09/2016, it was consistent.   Pain Inventory Average  Pain 6 Pain Right Now 5 My pain is intermittent, sharp, burning, stabbing and aching  In the last 24 hours, has pain interfered with the following? General activity 6 Relation with others 3 Enjoyment of life 4 What TIME of day is your pain at its worst? morning and night Sleep (in general) Poor  Pain is worse with: sitting and inactivity Pain improves with: rest, heat/ice, therapy/exercise, pacing activities and medication Relief from Meds: 7  Mobility Do you have any goals in this area?  no  Function Do you have any goals in this area?  no  Neuro/Psych bladder control problems  Prior Studies Any changes since last visit?  no  Physicians involved in your care Any changes since last visit?  no   Family History  Problem Relation Age of Onset  . Breast cancer Sister   . Diabetes Other   . Stroke Other        Grandparents   Social History   Socioeconomic History  . Marital status: Divorced    Spouse name: None  . Number of children: 1  . Years of education: None  . Highest education level: None  Social Needs  . Financial resource strain: None  . Food insecurity - worry: None  . Food insecurity - inability: None  . Transportation needs - medical: None  . Transportation needs - non-medical: None  Occupational History  . Occupation: retired Oncologist: RETIRED  Tobacco Use  . Smoking status: Former Games developer  . Smokeless tobacco: Never Used  Substance and Sexual Activity  . Alcohol use: No    Alcohol/week: 0.0 oz  .  Drug use: No  . Sexual activity: None  Other Topics Concern  . None  Social History Narrative  . None   Past Surgical History:  Procedure Laterality Date  . AMPUTATION  02/02/2008   below right knee  . foot surgury  1980 and 1981  . NASAL SEPTUM SURGERY    . 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 s/p Breast biopsy  1992  . s/p EGD  2007   with Botox for? Achalasia  . TONSILLECTOMY    . TUBAL  LIGATION     Past Medical History:  Diagnosis Date  . ABDOMINAL DISTENSION 10/11/2008  . Acute gouty arthropathy 12/11/2008  . Altered mental status 02/07/2010  . AMPUTATION, BELOW KNEE, RIGHT, HX OF 01/28/2009  . ANAPHYLACTIC SHOCK 05/15/2009  . ANEMIA-NOS 07/17/2008  . ANXIETY 07/17/2008  . ARTHRITIS 01/28/2009  . B12 DEFICIENCY 07/17/2008  . Cellulitis and abscess of leg, except foot 11/15/2008  . CHF 01/28/2009  . Chronic pain syndrome 02/14/2010  . COLONIC POLYPS, HX OF 07/17/2008  . DIABETES MELLITUS, TYPE II 07/17/2008  . Dysuria 11/11/2009  . FOOT PAIN 07/17/2008  . GERD 07/17/2008  . HEARING LOSS, RIGHT EAR 06/04/2009  . HYPERKALEMIA 10/31/2009  . Hyperlipemia 09/24/2010  . Hyperlipidemia 09/24/2010  . HYPERSOMNIA 02/14/2010  . HYPERTENSION 07/17/2008  . HYPONATREMIA 03/20/2010  . Hypoxemia 03/20/2010  . IBS 06/04/2009  . MENOPAUSAL DISORDER 12/03/2009  . NEUROPATHY, HX OF 01/28/2009  . Osteoporosis 11/16/2014  . PERIPHERAL EDEMA 08/30/2008  . Pneumonia, aspiration (HCC) 12/01/2011   Episode July 2013, tx per [facility]  . Pneumonia, organism unspecified(486) 02/14/2010  . RENAL INSUFFICIENCY 10/31/2009  . Rheumatoid arthritis(714.0) 07/17/2008  . Seizure (HCC) 10/26/2010  . SKIN LESION 06/04/2009  . SKIN ULCER, CHRONIC 02/08/2009  . Spinal stenosis of lumbar region 05/15/2015  . Trigeminal neuralgia 02/14/2010  . UNSPECIFIED HEPATITIS 01/28/2009  . Wheezing 12/03/2009   BP 134/80   Pulse 75   Resp 14   SpO2 93%   Opioid Risk Score:  1 Fall Risk Score:  `1  Depression screen PHQ 2/9  Depression screen [facility] 2/9 03/04/2017 10/07/2016 01/27/2016 12/26/2015 09/17/2015 02/25/2015 02/12/2015  Decreased Interest 1 0 0 0 0 1 0  Down, Depressed, Hopeless 1 0 0 0 0 1 0  PHQ - 2 Score 2 0 0 0 0 2 0  Altered sleeping - - - - - 3 -  Tired, decreased energy - - - - - 3 -  Change in appetite - - - - - - -  Feeling bad or failure about yourself  - - - - - - -  Trouble concentrating - - - - - 0 -    Moving slowly or fidgety/restless - - - - - 0 -  Suicidal thoughts - - - - - 0 -  PHQ-9 Score - - - - - 8 -  Difficult doing work/chores - - - - - Not difficult at all -  Some recent data might be hidden     Review of Systems  Constitutional: Negative.   HENT: Negative.   Eyes: Negative.   Respiratory: Negative.   Cardiovascular: Negative.   Gastrointestinal: Negative.   Endocrine: Negative.   Genitourinary: Positive for difficulty urinating, dyspareunia and dysuria.  Musculoskeletal: Negative.   Skin: Negative.   Allergic/Immunologic: Negative.   Neurological: Negative.   Hematological: Negative.   Psychiatric/Behavioral: Negative.   All other systems reviewed and are negative.  Objective:   Physical Exam  Constitutional: She is oriented to person, place, and time. She appears well-developed and well-nourished.  Morbid Obesity  HENT:  Head: Normocephalic and atraumatic.  Neck: Normal range of motion. Neck supple.  Cardiovascular: Normal rate and regular rhythm.  Pulmonary/Chest: Effort normal and breath sounds normal.  Musculoskeletal:  Normal Muscle Bulk and Muscle Testing Reveals:  Upper Extremities: Full ROM and Muscle Strength 5/5 Thoracic and Lumbar  Hypersensitivity Lower Extremities: Right: BKA Left: Decreased ROM and Muscle Strength 5/5: Left Lower Extremity Dressing Intact Arrived in Wheelchair   Neurological: She is alert and oriented to person, place, and time.  Skin:  Stage II Pressure Ulcer Noted on Left Buttock/ Wound Care following  Psychiatric: She has a normal mood and affect.  Nursing note and vitals reviewed.         Assessment & Plan:  1.Right below-knee amputation, history of phantom limb pain.: Continue Current Medication Regime and Continue to Monitor. 04/01/2017 2. Peripheral neuropathy: Continue Gabapentin and Cymbalta. 04/01/2017 3. Chronic Pain Syndrome: Refilled: Fenatnyl Patch 75 mcg one patch every three days #10 and  Continue Oxycodone 10/325mg  one tablet every 6 hours as needed #120. We discussed Tapentadol. 04/01/17 We will continue the opioid monitoring program, this consists of regular clinic visits, examinations, urine drug screen, pill counts as well as use of [place] Controlled Substance Reporting System. 4. History of rheumatoid arthritis. Continue Current Medication and Exercise and heat Therapy. 04/01/2017 5. Osteoarthritis of left knee: Continue with Voltaren gel/ Exercise and heat therapy. 04/01/2017 6. Chronic cellulitis, left leg with associated edema:PCP Following/ Attending [facility] weekly and Brad changing Dressing twice a Week.. 04/01/2017 7. Lumbar Spondylosis: Continue current medication regime. Continue HEP.04/01/2017 8. Chronic Right  Shoulder Pain:Continue HEP as Tolerated. 04/01/2017 9. Muscle Spasm: Continue Flexeril. 04/01/2017 10. Stage II Pressure Ulcer: [facility] Following.   20 minutes of face to face patient care time was spent during this visit. All questions were encouraged and answered.  F/U in 1 month

## 2017-04-01 NOTE — Patient Instructions (Signed)
Tapentadol immediate-release oral tablets What is this medicine? TAPENTADOL (ta PEN ta dol) is a pain reliever. It is used to treat moderate to severe pain. This medicine may be used for other purposes; ask your health care provider or pharmacist if you have questions. COMMON BRAND NAME(S): Nucynta What should I tell my health care provider before I take this medicine? They need to know if you have any of these conditions: -gallbladder disease -head injury -history of a drug or alcohol abuse problem -if you often drink alcohol -kidney disease -liver disease -lung or breathing disease, like asthma -prostate disease -seizures -stomach or intestine problems -thyroid disease -an unusual or allergic reaction to tapentadol, other medicines, foods, dyes, or preservatives -pregnant or trying to get pregnant -breast-feeding How should I use this medicine? Take this medicine by mouth with a glass of water. If the medicine upsets your stomach, take it with food or milk. Follow the directions on the prescription label. Do not take more than you are told to take. A special MedGuide will be given to you by the pharmacist with each prescription and refill. Be sure to read this information carefully each time. Talk to your pediatrician regarding the use of this medicine in children. Special care may be needed. Overdosage: If you think you have taken too much of this medicine contact a poison control center or emergency room at once. NOTE: This medicine is only for you. Do not share this medicine with others. What if I miss a dose? If you miss a dose, take it as soon as you can. If it is almost time for your next dose, take only that dose. Do not take double or extra doses. What may interact with this medicine? Do not take this medicine with any of the following medications: -MAOIs like Carbex, Eldepryl, Marplan, Nardil, and Parnate This medicine may also interact with the following  medications: -alcohol -antihistamines for allergy, cough and cold -atropine -certain medicines for anxiety or sleep -certain medicines for bladder problems like oxybutynin, tolterodine -certain medicines for depression like amitriptyline, fluoxetine, sertraline -certain medicines for migraine headache like almotriptan, eletriptan, frovatriptan, naratriptan, rizatriptan, sumatriptan, zolmitriptan -certain medicines for Parkinson's disease like benztropine, trihexyphenidyl -certain medicines for seizures like phenobarbital, primidone -certain medicines for stomach problems like dicyclomine, hyoscyamine -certain medicines for travel sickness like scopolamine -general anesthetics like halothane, isoflurane, methoxyflurane, propofol -ipratropium -local anesthetics like lidocaine, pramoxine, tetracaine -medicines that relax muscles for surgery -other narcotic medicines for pain or cough -phenothiazines like chlorpromazine, mesoridazine, prochlorperazine, thioridazine This list may not describe all possible interactions. Give your health care provider a list of all the medicines, herbs, non-prescription drugs, or dietary supplements you use. Also tell them if you smoke, drink alcohol, or use illegal drugs. Some items may interact with your medicine. What should I watch for while using this medicine? Tell your doctor or health care professional if your pain does not go away, if it gets worse, or if you have new or a different type of pain. You may develop tolerance to the medicine. Tolerance means that you will need a higher dose of the medicine for pain relief. Tolerance is normal and is expected if you take the medicine for a long time. Do not suddenly stop taking your medicine because you may develop a severe reaction. Your body becomes used to the medicine. This does NOT mean you are addicted. Addiction is a behavior related to getting and using a drug for a non-medical reason. If you have pain, you    have a medical reason to take pain medicine. Your doctor will tell you how much medicine to take. If your doctor wants you to stop the medicine, the dose will be slowly lowered over time to avoid any side effects. There are different types of narcotic medicines (opiates). If you take more than one type at the same time or if you are taking another medicine that also causes drowsiness, you may have more side effects. Give your health care provider a list of all medicines you use. Your doctor will tell you how much medicine to take. Do not take more medicine than directed. Call emergency for help if you have problems breathing or unusual sleepiness. You may get drowsy or dizzy. Do not drive, use machinery, or do anything that needs mental alertness until you know how this medicine affects you. Do not stand or sit up quickly, especially if you are an older patient. This reduces the risk of dizzy or fainting spells. Alcohol may interfere with the effect of this medicine. Avoid alcoholic drinks. This medicine will cause constipation. Try to have a bowel movement at least every 2 to 3 days. If you do not have a bowel movement for 3 days, call your doctor or health care professional. Your mouth may get dry. Chewing sugarless gum or sucking heard candy, and drinking plenty of water may help. Contact your doctor if the problem does not go away or is severe. What side effects may I notice from receiving this medicine? Side effects that you should report to your doctor or health care professional as soon as possible: -allergic reactions like skin rash, itching or hives, swelling of the face, lips, or tongue -breathing problems -confusion -seizures -signs and symptoms of low bleed pressure like dizziness; feeling faint or lightheaded, falls; unusually weak or tired -trouble passing urine or change in the amount of urine Side effects that usually do not require medical attention (report to your doctor or health care  professional if they continue or are bothersome): -constipation -dry mouth -nausea, vomiting -tiredness This list may not describe all possible side effects. Call your doctor for medical advice about side effects. You may report side effects to FDA at 1-800-FDA-1088. Where should I keep my medicine? Keep out of the reach of children. This medicine can be abused. Keep your medicine in a safe place to protect it from theft. Do not share this medicine with anyone. Selling or giving away this medicine is dangerous and against the law. Store at room temperature between 15 and 30 degrees C (59 and 86 degrees F). Protect from moisture. This medicine may cause accidental overdose and death if it is taken by other adults, children, or pets. Flush any unused medicine down the toilet to reduce the chance of harm. Do not use the medicine after the expiration date. NOTE: This sheet is a summary. It may not cover all possible information. If you have questions about this medicine, talk to your doctor, pharmacist, or health care provider.  2018 Elsevier/Gold Standard (2015-06-06 12:37:22)  

## 2017-04-02 LAB — BASIC METABOLIC PANEL
BUN/Creatinine Ratio: 13 (ref 12–28)
BUN: 17 mg/dL (ref 8–27)
CO2: 20 mmol/L (ref 20–29)
CREATININE: 1.31 mg/dL — AB (ref 0.57–1.00)
Calcium: 9.2 mg/dL (ref 8.7–10.3)
Chloride: 109 mmol/L — ABNORMAL HIGH (ref 96–106)
GFR calc Af Amer: 47 mL/min/{1.73_m2} — ABNORMAL LOW (ref >59)
GFR calc non Af Amer: 40 mL/min/{1.73_m2} — ABNORMAL LOW (ref >59)
GLUCOSE: 90 mg/dL (ref 65–99)
Potassium: 4.3 mmol/L (ref 3.5–5.2)
SODIUM: 142 mmol/L (ref 134–144)

## 2017-04-12 ENCOUNTER — Other Ambulatory Visit: Payer: Self-pay | Admitting: Registered Nurse

## 2017-04-12 DIAGNOSIS — I890 Lymphedema, not elsewhere classified: Secondary | ICD-10-CM | POA: Diagnosis not present

## 2017-04-12 DIAGNOSIS — I87312 Chronic venous hypertension (idiopathic) with ulcer of left lower extremity: Secondary | ICD-10-CM | POA: Diagnosis not present

## 2017-04-12 DIAGNOSIS — I872 Venous insufficiency (chronic) (peripheral): Secondary | ICD-10-CM | POA: Diagnosis not present

## 2017-04-12 DIAGNOSIS — M319 Necrotizing vasculopathy, unspecified: Secondary | ICD-10-CM | POA: Diagnosis not present

## 2017-04-12 DIAGNOSIS — L97522 Non-pressure chronic ulcer of other part of left foot with fat layer exposed: Secondary | ICD-10-CM | POA: Diagnosis not present

## 2017-04-12 DIAGNOSIS — E11622 Type 2 diabetes mellitus with other skin ulcer: Secondary | ICD-10-CM | POA: Diagnosis not present

## 2017-04-12 DIAGNOSIS — L97222 Non-pressure chronic ulcer of left calf with fat layer exposed: Secondary | ICD-10-CM | POA: Diagnosis not present

## 2017-04-19 NOTE — Progress Notes (Deleted)
Subjective:   Crystal Fitzpatrick is a 71 y.o. female who presents for Medicare Annual (Subsequent) preventive examination.  The Patient was informed that the wellness visit is to identify future health risk and educate and initiate measures that can reduce risk for increased disease through the lifespan.    Annual Wellness Assessment  Reports health as   Preventive Screening -Counseling & Management  Medicare Annual Preventive Care Visit - Subsequent Last OV 12/2015 for acute illness She has been in wound care  Health Maintenance Due  Topic Date Due  . Hepatitis C Screening  1945/11/22  . OPHTHALMOLOGY EXAM  01/19/1954  . FOOT EXAM  12/12/2013  . HEMOGLOBIN A1C  09/25/2015  . MAMMOGRAM  11/06/2016  . INFLUENZA VACCINE  12/16/2016    Colonoscopy 2011 due 2021 Dexa 10/2014 -1.4 Mammogram 10/2014;   Describes Health as poor, fair, good or great?   VS reviewed;   Diet   BMI  Exercise  Dental  Stressors:   Sleep patterns:   Pain?    Cardiac Risk Factors Addressed Hyperlipidemia - chol 198; hdl 40; ldl 123; trig 168 (2016) Diabetes /a1c 5.6; BS 90  Obesity  Advanced Directives  Patient Care Team: Laurey Morale, MD as PCP - General (Family Medicine)        Objective:     Vitals: There were no vitals taken for this visit.  There is no height or weight on file to calculate BMI.  Advanced Directives 03/04/2017 02/03/2017 01/04/2017 10/07/2016 09/07/2016 08/12/2016 06/09/2016  Does Patient Have a Medical Advance Directive? Yes No No Yes Yes Yes Yes  Type of Advance Directive - - - Living will Living will Living will Living will  Does patient want to make changes to medical advance directive? - - - - - - -  Copy of [place] in Chart? - - - - - - -  Would patient like information on creating a medical advance directive? - - - - - - -    Tobacco Social History   Tobacco Use  Smoking Status Former Smoker  Smokeless Tobacco Never Used       Counseling given: Not Answered   Clinical Intake:      Past Medical History:  Diagnosis Date  . ABDOMINAL DISTENSION 10/11/2008  . Acute gouty arthropathy 12/11/2008  . Altered mental status 02/07/2010  . AMPUTATION, BELOW KNEE, RIGHT, HX OF 01/28/2009  . ANAPHYLACTIC SHOCK 05/15/2009  . ANEMIA-NOS 07/17/2008  . ANXIETY 07/17/2008  . ARTHRITIS 01/28/2009  . B12 DEFICIENCY 07/17/2008  . Cellulitis and abscess of leg, except foot 11/15/2008  . CHF 01/28/2009  . Chronic pain syndrome 02/14/2010  . COLONIC POLYPS, HX OF 07/17/2008  . DIABETES MELLITUS, TYPE II 07/17/2008  . Dysuria 11/11/2009  . FOOT PAIN 07/17/2008  . GERD 07/17/2008  . HEARING LOSS, RIGHT EAR 06/04/2009  . HYPERKALEMIA 10/31/2009  . Hyperlipemia 09/24/2010  . Hyperlipidemia 09/24/2010  . HYPERSOMNIA 02/14/2010  . HYPERTENSION 07/17/2008  . HYPONATREMIA 03/20/2010  . Hypoxemia 03/20/2010  . IBS 06/04/2009  . MENOPAUSAL DISORDER 12/03/2009  . NEUROPATHY, HX OF 01/28/2009  . Osteoporosis 11/16/2014  . PERIPHERAL EDEMA 08/30/2008  . Pneumonia, aspiration ([place]) 12/01/2011   Episode July 2013, tx per [place]  . Pneumonia, organism unspecified(486) 02/14/2010  . RENAL INSUFFICIENCY 10/31/2009  . Rheumatoid arthritis(714.0) 07/17/2008  . Seizure ([place]) 10/26/2010  . SKIN LESION 06/04/2009  . SKIN ULCER, CHRONIC 02/08/2009  . Spinal stenosis of lumbar region 05/15/2015  .  Trigeminal neuralgia 02/14/2010  . UNSPECIFIED HEPATITIS 01/28/2009  . Wheezing 12/03/2009   Past Surgical History:  Procedure Laterality Date  . AMPUTATION  02/02/2008   below right knee  . foot surgury  1980 and 1981  . NASAL SEPTUM SURGERY    . 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 s/p Breast biopsy  1992  . s/p EGD  2007   with Botox for? Achalasia  . TONSILLECTOMY    . TUBAL LIGATION     Family History  Problem Relation Age of Onset  . Breast cancer Sister   . Diabetes Other   . Stroke Other        Grandparents   Social History    Socioeconomic History  . Marital status: Divorced    Spouse name: Not on file  . Number of children: 1  . Years of education: Not on file  . Highest education level: Not on file  Social Needs  . Financial resource strain: Not on file  . Food insecurity - worry: Not on file  . Food insecurity - inability: Not on file  . Transportation needs - medical: Not on file  . Transportation needs - non-medical: Not on file  Occupational History  . Occupation: retired Teacher, English as a foreign language: RETIRED  Tobacco Use  . Smoking status: Former Research scientist (life sciences)  . Smokeless tobacco: Never Used  Substance and Sexual Activity  . Alcohol use: No    Alcohol/week: 0.0 oz  . Drug use: No  . Sexual activity: Not on file  Other Topics Concern  . Not on file  Social History Narrative  . Not on file    Outpatient Encounter Medications as of 04/20/2017  Medication Sig  . albuterol (PROAIR HFA) 108 (90 BASE) MCG/ACT inhaler Inhale 2 puffs into the lungs every 6 (six) hours as needed.    [name] albuterol (PROVENTIL HFA;VENTOLIN HFA) 108 (90 Base) MCG/ACT inhaler Inhale into the lungs.  . Ascorbic Acid (VITAMIN C) 500 MG tablet Take 500 mg by mouth daily.    . Blood Glucose Monitoring Suppl (ONE TOUCH ULTRA 2) W/DEVICE KIT 1 Device by Does not apply route once.  . cephALEXin (KEFLEX) 500 MG capsule   . collagenase (SANTYL) ointment Apply topically.  . cyclobenzaprine (FLEXERIL) 10 MG tablet TAKE 1 CAPSULE BY MOUTH EVERY 8 HOURS ASNEEDED  . diclofenac sodium (VOLTAREN) 1 % GEL Apply topically.  . diphenoxylate-atropine (LOMOTIL) 2.5-0.025 MG tablet Take by mouth.  . DULoxetine (CYMBALTA) 60 MG capsule TAKE TWO CAPSULES EACH DAY  . EPINEPHrine 0.3 mg/0.3 mL IJ SOAJ injection Inject into the muscle.  . fentaNYL (DURAGESIC - DOSED MCG/HR) 75 MCG/HR Place 1 patch (75 mcg total) every 3 (three) days onto the skin. 1 patch every 3 days  . furosemide (LASIX) 40 MG tablet TAKE ONE TABLET EACH DAY as needed  for swelling  . furosemide (LASIX) 40 MG tablet TAKE ONE TABLET EACH DAY as needed for swelling  . gabapentin (NEURONTIN) 600 MG tablet TAKE 1 TABLET BY MOUTH 3 TIMES DAILY ANDDAILY AS NEEDED FOR NERVE RELATED PAIN  . meclizine (ANTIVERT) 25 MG tablet Take by mouth.  . Multiple Vitamins-Minerals (CENTRUM SILVER ULTRA WOMENS PO) Take by mouth daily.    . mupirocin ointment (BACTROBAN) 2 % APPLY TOPICALLY TO AFFECTED AREA TOPICALLY EVERY DAY  . olmesartan (BENICAR) 20 MG tablet TAKE 1 TABLET BY MOUTH EVERY DAY  . omega-3 acid ethyl esters (LOVAZA) 1 G capsule Take 1  capsule (1 g total) by mouth daily.  [name] omega-3 acid ethyl esters (LOVAZA) 1 g capsule Take by mouth.  [name] omeprazole (PRILOSEC) 20 MG capsule Take 20 mg by mouth daily.  Glory Rosebush DELICA LANCETS MISC Use as directed to test blood sugar dx 250.00  . oxyCODONE-acetaminophen (PERCOCET) 10-325 MG tablet Take 1 tablet every 6 (six) hours as needed by mouth for pain.  . phenazopyridine (PYRIDIUM) 95 MG tablet Take 95 mg by mouth.  . Probiotic Product (PROBIOTIC PO) Take by mouth daily.  . promethazine (PHENERGAN) 25 MG tablet Take by mouth.  . promethazine (PHENERGAN) 25 MG tablet TAKE 1 TABLET BY MOUTH EVERY 6 HOURS AS NEEDED FOR NAUSEA  . Psyllium (METAMUCIL) 30.9 % POWD Take by mouth. Take as directed   . PSYLLIUM PO Take by mouth.  . Tedizolid Phosphate 200 MG TABS Take 200 mg by mouth.  . topiramate (TOPAMAX) 100 MG tablet Take 3 tablets (300 mg total) by mouth at bedtime.  . topiramate (TOPAMAX) 100 MG tablet Take by mouth.  . triamcinolone (NASACORT AQ) 55 MCG/ACT AERO nasal inhaler Place 2 sprays into the nose daily.  [name] triamcinolone cream (KENALOG) 0.1 %   . triamcinolone cream (KENALOG) 0.1 % Apply topically.  . VESICARE 10 MG tablet   . VESICARE 5 MG tablet   . vitamin C (ASCORBIC ACID) 500 MG tablet Take by mouth.   No facility-administered encounter medications on file as of 04/20/2017.     Activities of Daily Living No  flowsheet data found.  Timed Get Up and Go performed: ***  Patient Care Team: Laurey Morale, MD as PCP - General (Family Medicine)    Assessment:    *** Exercise Activities and Dietary recommendations    Goals    None     Fall Risk Fall Risk  04/01/2017 03/04/2017 02/03/2017 10/07/2016 09/07/2016  Falls in the past year? Yes No Yes Yes Yes  Comment - - - - -  Number falls in past yr: 1 - 2 or more 2 or more 2 or more  Injury with Fall? No - No - No  Comment - - - - -  Risk Factor Category  High Fall Risk - High Fall Risk - High Fall Risk  Risk for fall due to : Impaired balance/gait;Impaired mobility - - Impaired mobility -  Risk for fall due to: Comment - - - - -  Follow up - - Falls prevention discussed Falls prevention discussed Falls prevention discussed  Comment - - - - -  *** Is the patient's home free of loose throw rugs in walkways, pet beds, electrical cords, etc?  {Blank single:19197::"yes","no"}      Grab bars in the bathroom? {Blank single:19197::"yes","no"}      Handrails on the stairs?   {Blank single:19197::"yes","no"}      Adequate lighting?   {Blank single:19197::"yes","no"}  Depression Screen PHQ 2/9 Scores 03/04/2017 10/07/2016 01/27/2016 12/26/2015  PHQ - 2 Score 2 0 0 0  PHQ- 9 Score - - - -     Cognitive Function        Immunization History  Administered Date(s) Administered  . H1N1 07/17/2008  . Influenza Split 01/29/2011, 05/23/2011  . Influenza Whole 03/19/2008, 03/12/2009, 02/14/2010  . Influenza,inj,Quad PF,6+ Mos 03/09/2014  . Influenza-Unspecified 03/11/2015  . Pneumococcal Conjugate-13 07/25/2013  . Pneumococcal Polysaccharide-23 01/17/2008, 06/14/2012  . Td 07/17/2008  . Zoster 07/17/2008   Screening Tests Health Maintenance  Topic Date Due  . Hepatitis C Screening  [birth year]  . OPHTHALMOLOGY EXAM  01/19/1954  . FOOT EXAM  12/12/2013  . HEMOGLOBIN A1C  09/25/2015  . MAMMOGRAM  11/06/2016  . INFLUENZA VACCINE  12/16/2016  .  TETANUS/TDAP  07/18/2018  . COLONOSCOPY  05/25/2019  . DEXA SCAN  Completed  . PNA vac Low Risk Adult  Completed   Cancer Screenings: Lung: former smoker; Low Dose CT Chest recommended if Age 36-80 years, 30 pack-year currently smoking OR have quit w/in 15years. Patient {DOES NOT does:27190::"does not"} qualify. Breast: *** Up to date on Mammogram? {Yes/No:30480221}  Up to date of Bone Density/Dexa? {Yes/No:30480221} Colorectal: ***  Additional Screenings: *** Hepatitis B/HIV/Syphillis: Hepatitis C Screening:      Plan:   ***   I have personally reviewed and noted the following in the patient's chart:   . Medical and social history . Use of alcohol, tobacco or illicit drugs  . Current medications and supplements . Functional ability and status . Nutritional status . Physical activity . Advanced directives . List of other physicians . Hospitalizations, surgeries, and ER visits in previous 12 months . Vitals . Screenings to include cognitive, depression, and falls . Referrals and appointments  In addition, I have reviewed and discussed with patient certain preventive protocols, quality metrics, and best practice recommendations. A written personalized care plan for preventive services as well as general preventive health recommendations were provided to patient.     Wynetta Fines, RN  04/19/2017

## 2017-04-20 ENCOUNTER — Ambulatory Visit: Payer: Medicare Other

## 2017-04-29 ENCOUNTER — Encounter: Payer: Medicare Other | Attending: Physical Medicine and Rehabilitation | Admitting: Registered Nurse

## 2017-04-29 ENCOUNTER — Other Ambulatory Visit: Payer: Self-pay | Admitting: Physical Medicine & Rehabilitation

## 2017-04-29 ENCOUNTER — Encounter: Payer: Self-pay | Admitting: Registered Nurse

## 2017-04-29 VITALS — BP 135/66 | HR 66

## 2017-04-29 DIAGNOSIS — M4716 Other spondylosis with myelopathy, lumbar region: Secondary | ICD-10-CM | POA: Diagnosis not present

## 2017-04-29 DIAGNOSIS — M25511 Pain in right shoulder: Secondary | ICD-10-CM | POA: Diagnosis not present

## 2017-04-29 DIAGNOSIS — G894 Chronic pain syndrome: Secondary | ICD-10-CM

## 2017-04-29 DIAGNOSIS — M25512 Pain in left shoulder: Secondary | ICD-10-CM | POA: Diagnosis not present

## 2017-04-29 DIAGNOSIS — Z5181 Encounter for therapeutic drug level monitoring: Secondary | ICD-10-CM

## 2017-04-29 DIAGNOSIS — L03116 Cellulitis of left lower limb: Secondary | ICD-10-CM | POA: Diagnosis not present

## 2017-04-29 DIAGNOSIS — L03119 Cellulitis of unspecified part of limb: Secondary | ICD-10-CM

## 2017-04-29 DIAGNOSIS — M069 Rheumatoid arthritis, unspecified: Secondary | ICD-10-CM | POA: Insufficient documentation

## 2017-04-29 DIAGNOSIS — M48061 Spinal stenosis, lumbar region without neurogenic claudication: Secondary | ICD-10-CM | POA: Diagnosis not present

## 2017-04-29 DIAGNOSIS — Z89519 Acquired absence of unspecified leg below knee: Secondary | ICD-10-CM | POA: Diagnosis not present

## 2017-04-29 DIAGNOSIS — G609 Hereditary and idiopathic neuropathy, unspecified: Secondary | ICD-10-CM | POA: Diagnosis not present

## 2017-04-29 DIAGNOSIS — M179 Osteoarthritis of knee, unspecified: Secondary | ICD-10-CM | POA: Insufficient documentation

## 2017-04-29 DIAGNOSIS — Z79899 Other long term (current) drug therapy: Secondary | ICD-10-CM

## 2017-04-29 DIAGNOSIS — M47816 Spondylosis without myelopathy or radiculopathy, lumbar region: Secondary | ICD-10-CM

## 2017-04-29 DIAGNOSIS — L02419 Cutaneous abscess of limb, unspecified: Secondary | ICD-10-CM

## 2017-04-29 DIAGNOSIS — G546 Phantom limb syndrome with pain: Secondary | ICD-10-CM

## 2017-04-29 MED ORDER — OXYCODONE-ACETAMINOPHEN 10-325 MG PO TABS: 1.0000 | ORAL_TABLET | Freq: Four times a day (QID) | ORAL | 0 refills | Status: DC | PRN

## 2017-04-29 MED ORDER — DULOXETINE HCL 60 MG PO CPEP: ORAL_CAPSULE | ORAL | 2 refills | Status: DC

## 2017-04-29 MED ORDER — FENTANYL 75 MCG/HR TD PT72: 75.0000 ug | MEDICATED_PATCH | TRANSDERMAL | 0 refills | Status: DC

## 2017-04-29 NOTE — Progress Notes (Signed)
Subjective:    Patient ID: Crystal Fitzpatrick, female    DOB: 02-02-1946, 71 y.o.   MRN: [id number]  HPI: Crystal Fitzpatrick is a 71year old female who returns for follow up appointmentfor chronic pain and medication refill. She states her pain is located in her bilateral shoulders,lower back radiating down her bilateral lower extremities  ( on the Right down her right lower extremity into her stump and left into Left lower extremity into her foot) ,also reports right stump pain occassionally (phantom)and left lower extremity pain  Also Left lower extremity dressing intact  ( Coban Wrap) .She rates her pain 4. Her current exercise regime is performing stretching exercises daily (Yoga) daily.  Crystal Fitzpatrick Morphine equivalent is 240.00 MME.  Crystal Fitzpatrick  has Stage II pressure ulcer noted. [facility] Following, they referred her to Dr. Katrinka Blazing Dermatologist she seen him on 03/02/2017. She reports she was instructed she needs an amputation. Crystal Fitzpatrick has a  scheduled appointment for wound center at [facility] for second opinion on 05/06/2017.   At the present time she is receiving [facility] weekly Left lower extremity and [facility] changing the dressing on Wednesdays and Fridays.  Oral Swab was performed on 11/09/2016, it was consistent.   Pain Inventory Average Pain 6 Pain Right Now 4 My pain is intermittent, sharp, burning, stabbing and aching  In the last 24 hours, has pain interfered with the following? General activity 3 Relation with others 3 Enjoyment of life 2 What TIME of day is your pain at its worst? morning and night Sleep (in general) Poor  Pain is worse with: sitting and inactivity Pain improves with: rest, heat/ice, therapy/exercise, pacing activities and medication Relief from Meds: 7  Mobility Do you have any goals in this area?  no  Function Do you have any goals in this area?  no  Neuro/Psych bladder control  problems  Prior Studies Any changes since last visit?  no  Physicians involved in your care Any changes since last visit?  no   Family History  Problem Relation Age of Onset  . Breast cancer Sister   . Diabetes Other   . Stroke Other        Grandparents   Social History   Socioeconomic History  . Marital status: Divorced    Spouse name: Not on file  . Number of children: 1  . Years of education: Not on file  . Highest education level: Not on file  Social Needs  . Financial resource strain: Not on file  . Food insecurity - worry: Not on file  . Food insecurity - inability: Not on file  . Transportation needs - medical: Not on file  . Transportation needs - non-medical: Not on file  Occupational History  . Occupation: retired Oncologist: RETIRED  Tobacco Use  . Smoking status: Former Games developer  . Smokeless tobacco: Never Used  Substance and Sexual Activity  . Alcohol use: No    Alcohol/week: 0.0 oz  . Drug use: No  . Sexual activity: Not on file  Other Topics Concern  . Not on file  Social History Narrative  . Not on file   Past Surgical History:  Procedure Laterality Date  . AMPUTATION  02/02/2008   below right knee  . foot surgury  1980 and 1981  . NASAL SEPTUM SURGERY    . s/p BKA  9/09  For DFU and Osteomyelitis  . s/p Breast biopsy  1992  . s/p EGD  2007   with Botox for? Achalasia  . TONSILLECTOMY    . TUBAL LIGATION     Past Medical History:  Diagnosis Date  . ABDOMINAL DISTENSION 10/11/2008  . Acute gouty arthropathy 12/11/2008  . Altered mental status 02/07/2010  . AMPUTATION, BELOW KNEE, RIGHT, HX OF 01/28/2009  . ANAPHYLACTIC SHOCK 05/15/2009  . ANEMIA-NOS 07/17/2008  . ANXIETY 07/17/2008  . ARTHRITIS 01/28/2009  . B12 DEFICIENCY 07/17/2008  . Cellulitis and abscess of leg, except foot 11/15/2008  . CHF 01/28/2009  . Chronic pain syndrome 02/14/2010  . COLONIC POLYPS, HX OF 07/17/2008  . DIABETES MELLITUS, TYPE II  07/17/2008  . Dysuria 11/11/2009  . FOOT PAIN 07/17/2008  . GERD 07/17/2008  . HEARING LOSS, RIGHT EAR 06/04/2009  . HYPERKALEMIA 10/31/2009  . Hyperlipemia 09/24/2010  . Hyperlipidemia 09/24/2010  . HYPERSOMNIA 02/14/2010  . HYPERTENSION 07/17/2008  . HYPONATREMIA 03/20/2010  . Hypoxemia 03/20/2010  . IBS 06/04/2009  . MENOPAUSAL DISORDER 12/03/2009  . NEUROPATHY, HX OF 01/28/2009  . Osteoporosis 11/16/2014  . PERIPHERAL EDEMA 08/30/2008  . Pneumonia, aspiration ([facility]) 12/01/2011   Episode July 2013, tx per [facility]  . Pneumonia, organism unspecified(486) 02/14/2010  . RENAL INSUFFICIENCY 10/31/2009  . Rheumatoid arthritis(714.0) 07/17/2008  . Seizure ([facility]) 10/26/2010  . SKIN LESION 06/04/2009  . SKIN ULCER, CHRONIC 02/08/2009  . Spinal stenosis of lumbar region 05/15/2015  . Trigeminal neuralgia 02/14/2010  . UNSPECIFIED HEPATITIS 01/28/2009  . Wheezing 12/03/2009   There were no vitals taken for this visit.  Opioid Risk Score:  1 Fall Risk Score:  `1  Depression screen PHQ 2/9  Depression screen [facility] 2/9 03/04/2017 10/07/2016 01/27/2016 12/26/2015 09/17/2015 02/25/2015 02/12/2015  Decreased Interest 1 0 0 0 0 1 0  Down, Depressed, Hopeless 1 0 0 0 0 1 0  PHQ - 2 Score 2 0 0 0 0 2 0  Altered sleeping - - - - - 3 -  Tired, decreased energy - - - - - 3 -  Change in appetite - - - - - - -  Feeling bad or failure about yourself  - - - - - - -  Trouble concentrating - - - - - 0 -  Moving slowly or fidgety/restless - - - - - 0 -  Suicidal thoughts - - - - - 0 -  PHQ-9 Score - - - - - 8 -  Difficult doing work/chores - - - - - Not difficult at all -  Some recent data might be hidden     Review of Systems  Constitutional: Negative.   HENT: Negative.   Eyes: Negative.   Respiratory: Negative.   Cardiovascular: Negative.   Gastrointestinal: Negative.   Endocrine: Negative.   Genitourinary: Negative.   Musculoskeletal: Negative.   Skin: Negative.   Allergic/Immunologic: Negative.     Neurological: Negative.   Hematological: Negative.   Psychiatric/Behavioral: Negative.   All other systems reviewed and are negative.      Objective:   Physical Exam  Constitutional: She is oriented to person, place, and time. She appears well-developed and well-nourished.  Morbid Obesity  HENT:  Head: Normocephalic and atraumatic.  Neck: Normal range of motion. Neck supple.  Cardiovascular: Normal rate and regular rhythm.  Pulmonary/Chest: Effort normal and breath sounds normal.  Musculoskeletal:  Normal Muscle Bulk and Muscle Testing Reveals:  Upper Extremities: Full ROM and Muscle Strength 5/5  Bilateral AC Joint Tenderness Lumbar  Hypersensitivity Lower Extremities: Right: BKA Left: Decreased ROM and Muscle Strength 5/5: Left Lower Extremity Dressing Intact Arrived in Wheelchair   Neurological: She is alert and oriented to person, place, and time.  Skin:  Stage II Pressure Ulcer Noted on Left Buttock/ Wound Care following  Psychiatric: She has a normal mood and affect.  Nursing note and vitals reviewed.         Assessment & Plan:  1.Right below-knee amputation, history of phantom limb pain.: Continue Current Medication Regime and Continue to Monitor. 04/29/2017 2. Peripheral neuropathy: Continue Gabapentin and Cymbalta. 04/29/2017 3. Chronic Pain Syndrome: Refilled: Fenatnyl Patch 75 mcg one patch every three days #10 and Continue Oxycodone 10/325mg  one tablet every 6 hours as needed #120.04/29/17 We will continue the opioid monitoring program, this consists of regular clinic visits, examinations, urine drug screen, pill counts as well as use of [place] Controlled Substance Reporting System. 4. History of rheumatoid arthritis. Continue Current Medication and Exercise and heat Therapy. 04/29/2017 5. Osteoarthritis of left knee: Continue with Voltaren gel/ Exercise and heat therapy. 04/29/2017 6. Chronic cellulitis, left leg with associated edema:PCP Following/  Attending [facility] weekly and [facility] changing Dressing twice a Week. 04/29/2017 7. Lumbar Spondylosis/ Lumbar Stenosis: Continue current medication regime. Continue HEP.04/29/2017 8. Bilateral Shoulder Pain:Continue HEP as Tolerated. 04/29/2017 9. Muscle Spasm: Continue Flexeril. 04/29/2017 10. Stage II Pressure Ulcer: [facility] Following. Awaiting Second Opinion at [facility] on 05/06/17. 04/29/2017  20 minutes of face to face patient care time was spent during this visit. All questions were encouraged and answered.  F/U in 1 month

## 2017-05-03 DIAGNOSIS — M319 Necrotizing vasculopathy, unspecified: Secondary | ICD-10-CM | POA: Diagnosis not present

## 2017-05-03 DIAGNOSIS — E11622 Type 2 diabetes mellitus with other skin ulcer: Secondary | ICD-10-CM | POA: Diagnosis not present

## 2017-05-03 DIAGNOSIS — L97222 Non-pressure chronic ulcer of left calf with fat layer exposed: Secondary | ICD-10-CM | POA: Diagnosis not present

## 2017-05-03 DIAGNOSIS — I890 Lymphedema, not elsewhere classified: Secondary | ICD-10-CM | POA: Diagnosis not present

## 2017-05-03 DIAGNOSIS — I872 Venous insufficiency (chronic) (peripheral): Secondary | ICD-10-CM | POA: Diagnosis not present

## 2017-05-03 DIAGNOSIS — I87312 Chronic venous hypertension (idiopathic) with ulcer of left lower extremity: Secondary | ICD-10-CM | POA: Diagnosis not present

## 2017-05-06 ENCOUNTER — Encounter (HOSPITAL_BASED_OUTPATIENT_CLINIC_OR_DEPARTMENT_OTHER): Payer: Medicare Other

## 2017-05-13 DIAGNOSIS — L97522 Non-pressure chronic ulcer of other part of left foot with fat layer exposed: Secondary | ICD-10-CM | POA: Diagnosis not present

## 2017-05-13 DIAGNOSIS — E11622 Type 2 diabetes mellitus with other skin ulcer: Secondary | ICD-10-CM | POA: Diagnosis not present

## 2017-05-13 DIAGNOSIS — L97222 Non-pressure chronic ulcer of left calf with fat layer exposed: Secondary | ICD-10-CM | POA: Diagnosis not present

## 2017-05-26 ENCOUNTER — Encounter: Payer: Medicare Other | Attending: Physical Medicine and Rehabilitation | Admitting: Registered Nurse

## 2017-05-26 ENCOUNTER — Other Ambulatory Visit: Payer: Self-pay

## 2017-05-26 ENCOUNTER — Encounter: Payer: Self-pay | Admitting: Registered Nurse

## 2017-05-26 VITALS — BP 133/73 | HR 75

## 2017-05-26 DIAGNOSIS — Z89519 Acquired absence of unspecified leg below knee: Secondary | ICD-10-CM | POA: Diagnosis present

## 2017-05-26 DIAGNOSIS — M48061 Spinal stenosis, lumbar region without neurogenic claudication: Secondary | ICD-10-CM | POA: Diagnosis not present

## 2017-05-26 DIAGNOSIS — G894 Chronic pain syndrome: Secondary | ICD-10-CM | POA: Diagnosis not present

## 2017-05-26 DIAGNOSIS — Z79899 Other long term (current) drug therapy: Secondary | ICD-10-CM

## 2017-05-26 DIAGNOSIS — L03119 Cellulitis of unspecified part of limb: Secondary | ICD-10-CM

## 2017-05-26 DIAGNOSIS — M179 Osteoarthritis of knee, unspecified: Secondary | ICD-10-CM | POA: Diagnosis present

## 2017-05-26 DIAGNOSIS — G609 Hereditary and idiopathic neuropathy, unspecified: Secondary | ICD-10-CM | POA: Diagnosis not present

## 2017-05-26 DIAGNOSIS — G546 Phantom limb syndrome with pain: Secondary | ICD-10-CM

## 2017-05-26 DIAGNOSIS — L02419 Cutaneous abscess of limb, unspecified: Secondary | ICD-10-CM | POA: Diagnosis not present

## 2017-05-26 DIAGNOSIS — M4716 Other spondylosis with myelopathy, lumbar region: Secondary | ICD-10-CM | POA: Diagnosis present

## 2017-05-26 DIAGNOSIS — M069 Rheumatoid arthritis, unspecified: Secondary | ICD-10-CM | POA: Insufficient documentation

## 2017-05-26 DIAGNOSIS — Z5181 Encounter for therapeutic drug level monitoring: Secondary | ICD-10-CM

## 2017-05-26 MED ORDER — OXYCODONE-ACETAMINOPHEN 10-325 MG PO TABS: 1.0000 | ORAL_TABLET | Freq: Four times a day (QID) | ORAL | 0 refills | Status: DC | PRN

## 2017-05-26 MED ORDER — FENTANYL 75 MCG/HR TD PT72: 75.0000 ug | MEDICATED_PATCH | TRANSDERMAL | 0 refills | Status: DC

## 2017-05-26 NOTE — Progress Notes (Signed)
Subjective:    Patient ID: Crystal Fitzpatrick, female    DOB: 02-May-1946, 72 y.o.   MRN: [id number]  HPI: Crystal Fitzpatrick is a 72year old female who returns for follow up appointmentfor chronic pain and medication refill. She states her pain is located in her lower back, left lower extremity and left foot. Crystal Fitzpatrick reports she was trying to lift a hamper over the weekend and felt a pull in her mid-back, she denies falling. Also reports she was having muscle spasms and using her flexeril and performing stretching exercises.  Educated on using moderation while performing household chores and no heavy lifting, she verbalizes understanding. Left lower extremity dressing intact ( Coban Wrap).Crystal Fitzpatrick voiced dissatisfaction with the Wound Center at [facility] states" no one is listening and her wounds are constantly draining through the dressing all the time", she refuses ED evaluation. States she has an appointment tomorrow 05/27/17 at the Wound Center and will speak with them again. The above was discussed with Dr. Riley Kill he would like for Crystal Fitzpatrick to follow up with Vascular and her PCP. Crystal Fitzpatrick states " she wants to speak to her PCP and have them refer her to Vascular, she refuses referral to Dr. Lajoyce Corners. Wound Center Notes were Reviewed.    She rates her pain 8. Her current exercise regime is performing stretching exercises daily (Yoga).  Crystal Fitzpatrick Morphine equivalent is 264.00 MME.   Oral Swab was performed on 11/09/2016, it was consistent.Oral Swab was Performed Today.   Pain Inventory Average Pain 5 Pain Right Now 8 My pain is constant, burning, stabbing and aching  In the last 24 hours, has pain interfered with the following? General activity 5 Relation with others 7 Enjoyment of life 7 What TIME of day is your pain at its worst? night Sleep (in general) Poor  Pain is worse with: sitting and inactivity Pain improves with: rest Relief from Meds: 7  Mobility ability to climb steps?   no do you drive?  no use a wheelchair Do you have any goals in this area?  no  Function Do you have any goals in this area?  no  Neuro/Psych No problems in this area  Prior Studies Any changes since last visit?  no  Physicians involved in your care Any changes since last visit?  no   Family History  Problem Relation Age of Onset  . Breast cancer Sister   . Diabetes Other   . Stroke Other        Grandparents   Social History   Socioeconomic History  . Marital status: Divorced    Spouse name: None  . Number of children: 1  . Years of education: None  . Highest education level: None  Social Needs  . Financial resource strain: None  . Food insecurity - worry: None  . Food insecurity - inability: None  . Transportation needs - medical: None  . Transportation needs - non-medical: None  Occupational History  . Occupation: retired Oncologist: RETIRED  Tobacco Use  . Smoking status: Former Games developer  . Smokeless tobacco: Never Used  Substance and Sexual Activity  . Alcohol use: No    Alcohol/week: 0.0 oz  . Drug use: No  . Sexual activity: None  Other Topics Concern  . None  Social History Narrative  . None   Past Surgical History:  Procedure Laterality Date  . AMPUTATION  02/02/2008   below right knee  . foot surgury  1980 and 1981  . NASAL SEPTUM SURGERY    . 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 s/p Breast biopsy  1992  . s/p EGD  2007   with Botox for? Achalasia  . TONSILLECTOMY    . TUBAL LIGATION     Past Medical History:  Diagnosis Date  . ABDOMINAL DISTENSION 10/11/2008  . Acute gouty arthropathy 12/11/2008  . Altered mental status 02/07/2010  . AMPUTATION, BELOW KNEE, RIGHT, HX OF 01/28/2009  . ANAPHYLACTIC SHOCK 05/15/2009  . ANEMIA-NOS 07/17/2008  . ANXIETY 07/17/2008  . ARTHRITIS 01/28/2009  . B12 DEFICIENCY 07/17/2008  . Cellulitis and abscess of leg, except foot 11/15/2008  . CHF 01/28/2009  . Chronic pain  syndrome 02/14/2010  . COLONIC POLYPS, HX OF 07/17/2008  . DIABETES MELLITUS, TYPE II 07/17/2008  . Dysuria 11/11/2009  . FOOT PAIN 07/17/2008  . GERD 07/17/2008  . HEARING LOSS, RIGHT EAR 06/04/2009  . HYPERKALEMIA 10/31/2009  . Hyperlipemia 09/24/2010  . Hyperlipidemia 09/24/2010  . HYPERSOMNIA 02/14/2010  . HYPERTENSION 07/17/2008  . HYPONATREMIA 03/20/2010  . Hypoxemia 03/20/2010  . IBS 06/04/2009  . MENOPAUSAL DISORDER 12/03/2009  . NEUROPATHY, HX OF 01/28/2009  . Osteoporosis 11/16/2014  . PERIPHERAL EDEMA 08/30/2008  . Pneumonia, aspiration (HCC) 12/01/2011   Episode July 2013, tx per [facility]  . Pneumonia, organism unspecified(486) 02/14/2010  . RENAL INSUFFICIENCY 10/31/2009  . Rheumatoid arthritis(714.0) 07/17/2008  . Seizure (HCC) 10/26/2010  . SKIN LESION 06/04/2009  . SKIN ULCER, CHRONIC 02/08/2009  . Spinal stenosis of lumbar region 05/15/2015  . Trigeminal neuralgia 02/14/2010  . UNSPECIFIED HEPATITIS 01/28/2009  . Wheezing 12/03/2009   There were no vitals taken for this visit.  Opioid Risk Score:  1 Fall Risk Score:  `1  Depression screen PHQ 2/9  Depression screen [facility] 2/9 05/26/2017 03/04/2017 10/07/2016 01/27/2016 12/26/2015 09/17/2015 02/25/2015  Decreased Interest 1 1 0 0 0 0 1  Down, Depressed, Hopeless 1 1 0 0 0 0 1  PHQ - 2 Score 2 2 0 0 0 0 2  Altered sleeping - - - - - - 3  Tired, decreased energy - - - - - - 3  Change in appetite - - - - - - -  Feeling bad or failure about yourself  - - - - - - -  Trouble concentrating - - - - - - 0  Moving slowly or fidgety/restless - - - - - - 0  Suicidal thoughts - - - - - - 0  PHQ-9 Score - - - - - - 8  Difficult doing work/chores - - - - - - Not difficult at all  Some recent data might be hidden     Review of Systems  Constitutional: Negative.   HENT: Negative.   Eyes: Negative.   Respiratory: Negative.   Cardiovascular: Negative.   Gastrointestinal: Negative.   Endocrine: Negative.   Genitourinary: Positive for  difficulty urinating, dyspareunia and dysuria.  Musculoskeletal: Negative.   Skin: Negative.   Allergic/Immunologic: Negative.   Neurological: Negative.   Hematological: Negative.   Psychiatric/Behavioral: Negative.   All other systems reviewed and are negative.      Objective:   Physical Exam  Constitutional: She is oriented to person, place, and time. She appears well-developed and well-nourished.  Morbid Obesity  HENT:  Head: Normocephalic and atraumatic.  Neck: Normal range of motion. Neck supple.  Cardiovascular: Normal rate and regular rhythm.  Pulmonary/Chest: Effort normal and  breath sounds normal.  Musculoskeletal:  Normal Muscle Bulk and Muscle Testing Reveals:  Upper Extremities: Full ROM and Muscle Strength 5/5 Left AC Joint With Tenderenss Thoracic Paraspinal Tenderness: T-7-T-10 Mainly Right Side  Lumbar Paraspinal Tendereness: L-3-L-5 Lower Extremities: Right: BKA Left: Decreased ROM and Muscle Strength 4/5 Left Lower Extremity Flexion Produce Pain into Patella Left Lower Extremity Dressing Intact Arrived in Wheelchair   Neurological: She is alert and oriented to person, place, and time.  Skin:  Stage II Pressure Ulcer Noted on Left Buttock/ Wound Care following  Psychiatric: She has a normal mood and affect.  Nursing note and vitals reviewed.         Assessment & Plan:  1.Right below-knee amputation, history of phantom limb pain.: Continue Current Medication Regime and Continue to Monitor. 01/09//2019 2. Peripheral neuropathy: Continue Gabapentin and Cymbalta. 05/26/2017 3. Chronic Pain Syndrome: Refilled: Fenatnyl Patch 75 mcg one patch every three days #10 and Continue Oxycodone 10/325mg  one tablet every 6 hours as needed #120. 05/26/2017 We will continue the opioid monitoring program, this consists of regular clinic visits, examinations, urine drug screen, pill counts as well as use of [place] Controlled Substance Reporting System. 4. History  of rheumatoid arthritis. Continue Current Medication and Exercise and heat Therapy. 01/09//2019 5. Osteoarthritis of left knee: Continue with Voltaren gel/ Exercise and heat therapy. 05/26/2017 6. Chronic cellulitis, left leg with associated edema:PCP Following/ Attending [facility] weekly. 05/26/2017 7. Lumbar Spondylosis: Continue current medication regime. Continue HEP.05/26/2017 8. Chronic Right  Shoulder Pain:No complaints Today. Continue HEP as Tolerated. 01/09//2019 9. Muscle Spasm: Continue Flexeril. 05/26/2017 10. Stage II Pressure Ulcer: [facility] Following. 05/26/2017  30 minutes of face to face patient care time was spent during this visit. All questions were encouraged and answered.  F/U in 1 month

## 2017-05-31 LAB — DRUG TOX MONITOR 1 W/CONF, ORAL FLD
Amphetamines: NEGATIVE ng/mL (ref <10)
BENZODIAZEPINES: NEGATIVE ng/mL (ref <0.50)
BUPRENORPHINE: NEGATIVE ng/mL (ref <0.10)
Barbiturates: NEGATIVE ng/mL (ref <10)
CODEINE: NEGATIVE ng/mL (ref <2.5)
Cocaine: NEGATIVE ng/mL (ref <5.0)
Dihydrocodeine: NEGATIVE ng/mL (ref <2.5)
Fentanyl: 6.4 ng/mL — ABNORMAL HIGH (ref <0.10)
Fentanyl: POSITIVE ng/mL — AB (ref <0.10)
HEROIN METABOLITE: NEGATIVE ng/mL (ref <1.0)
HYDROCODONE: NEGATIVE ng/mL (ref <2.5)
HYDROMORPHONE: NEGATIVE ng/mL (ref <2.5)
MARIJUANA: NEGATIVE ng/mL (ref <2.5)
MDMA: NEGATIVE ng/mL (ref <10)
MORPHINE: NEGATIVE ng/mL (ref <2.5)
Meprobamate: NEGATIVE ng/mL (ref <2.5)
Methadone: NEGATIVE ng/mL (ref <5.0)
Nicotine Metabolite: NEGATIVE ng/mL (ref <5.0)
Norhydrocodone: NEGATIVE ng/mL (ref <2.5)
Noroxycodone: 99.6 ng/mL — ABNORMAL HIGH (ref <2.5)
OXYCODONE: 161.4 ng/mL — AB (ref <2.5)
Opiates: POSITIVE ng/mL — AB (ref <2.5)
Oxymorphone: NEGATIVE ng/mL (ref <2.5)
Phencyclidine: NEGATIVE ng/mL (ref <10)
TRAMADOL: NEGATIVE ng/mL (ref <5.0)
Tapentadol: NEGATIVE ng/mL (ref <5.0)
Zolpidem: NEGATIVE ng/mL (ref <5.0)

## 2017-05-31 LAB — DRUG TOX ALC METAB W/CON, ORAL FLD: ALCOHOL METABOLITE: NEGATIVE ng/mL (ref <25)

## 2017-06-01 ENCOUNTER — Telehealth: Payer: Self-pay | Admitting: *Deleted

## 2017-06-01 NOTE — Telephone Encounter (Signed)
Oral swab drug screen was consistent for prescribed medications.  ?

## 2017-06-17 ENCOUNTER — Telehealth: Payer: Self-pay | Admitting: Family Medicine

## 2017-06-17 ENCOUNTER — Encounter (HOSPITAL_BASED_OUTPATIENT_CLINIC_OR_DEPARTMENT_OTHER): Payer: Medicare Other | Attending: Internal Medicine

## 2017-06-17 NOTE — Telephone Encounter (Signed)
Copied from CRM [id number]. Topic: Quick Communication - Rx Refill/Question >> Jun 17, 2017  1:13 PM Diana Eves B wrote: Medication: Benicar  Preferred Pharmacy (with phone number or street name): [facility] - [place] - [address]   Pt hasnt been seen by Dr. Clent Ridges since 2017. I told her we would need to set up and appt with Dr. Clent Ridges. She is set to see him on 06/22/17. She has been out of the medicine for 3 days.   Agent: Please be advised that RX refills may take up to 3 business days. We ask that you follow-up with your pharmacy.

## 2017-06-18 ENCOUNTER — Other Ambulatory Visit: Payer: Self-pay | Admitting: Internal Medicine

## 2017-06-18 ENCOUNTER — Other Ambulatory Visit: Payer: Self-pay | Admitting: Family Medicine

## 2017-06-18 NOTE — Telephone Encounter (Signed)
Medication was refilled today.

## 2017-06-22 ENCOUNTER — Ambulatory Visit: Payer: Medicare Other | Admitting: Family Medicine

## 2017-06-24 ENCOUNTER — Encounter: Payer: Self-pay | Admitting: Registered Nurse

## 2017-06-24 ENCOUNTER — Encounter: Payer: Medicare Other | Attending: Physical Medicine and Rehabilitation | Admitting: Registered Nurse

## 2017-06-24 ENCOUNTER — Ambulatory Visit: Payer: Medicare Other | Admitting: Family Medicine

## 2017-06-24 ENCOUNTER — Other Ambulatory Visit: Payer: Self-pay

## 2017-06-24 VITALS — BP 130/84 | HR 95

## 2017-06-24 DIAGNOSIS — M4716 Other spondylosis with myelopathy, lumbar region: Secondary | ICD-10-CM | POA: Diagnosis present

## 2017-06-24 DIAGNOSIS — G894 Chronic pain syndrome: Secondary | ICD-10-CM | POA: Diagnosis not present

## 2017-06-24 DIAGNOSIS — M79672 Pain in left foot: Secondary | ICD-10-CM

## 2017-06-24 DIAGNOSIS — M48061 Spinal stenosis, lumbar region without neurogenic claudication: Secondary | ICD-10-CM

## 2017-06-24 DIAGNOSIS — M069 Rheumatoid arthritis, unspecified: Secondary | ICD-10-CM | POA: Insufficient documentation

## 2017-06-24 DIAGNOSIS — G546 Phantom limb syndrome with pain: Secondary | ICD-10-CM | POA: Diagnosis not present

## 2017-06-24 DIAGNOSIS — Z89519 Acquired absence of unspecified leg below knee: Secondary | ICD-10-CM | POA: Diagnosis present

## 2017-06-24 DIAGNOSIS — G609 Hereditary and idiopathic neuropathy, unspecified: Secondary | ICD-10-CM | POA: Diagnosis not present

## 2017-06-24 DIAGNOSIS — Z5181 Encounter for therapeutic drug level monitoring: Secondary | ICD-10-CM | POA: Diagnosis not present

## 2017-06-24 DIAGNOSIS — R5381 Other malaise: Secondary | ICD-10-CM

## 2017-06-24 DIAGNOSIS — M179 Osteoarthritis of knee, unspecified: Secondary | ICD-10-CM | POA: Insufficient documentation

## 2017-06-24 DIAGNOSIS — M542 Cervicalgia: Secondary | ICD-10-CM

## 2017-06-24 DIAGNOSIS — Z79899 Other long term (current) drug therapy: Secondary | ICD-10-CM

## 2017-06-24 MED ORDER — FENTANYL 75 MCG/HR TD PT72: 75.0000 ug | MEDICATED_PATCH | TRANSDERMAL | 0 refills | Status: DC

## 2017-06-24 MED ORDER — OXYCODONE-ACETAMINOPHEN 10-325 MG PO TABS: 1.0000 | ORAL_TABLET | Freq: Four times a day (QID) | ORAL | 0 refills | Status: DC | PRN

## 2017-06-24 NOTE — Progress Notes (Signed)
Subjective:    Patient ID: Crystal Fitzpatrick, female    DOB: Jul 12, 1945, 72 y.o.   MRN: [id number]  HPI: Crystal Fitzpatrick is a 71year old female who returns for follow up appointmentfor chronic pain and medication refill. She states her pain is located in her neck, lower back and left foot. She rates her pain 6. Her current exercise regime is performing stretching exercises daily (Yoga).    Crystal Fitzpatrick also reports three days ago while she was sitting in her wheelchair she was leaning forward and slipped out of her wheelchair. She landed on her left hip, she called the Fire Department and they picked her up. Educated on [employer], at the time of the fall she reports the brakes were not locked. Also instructed to keep her wheelchair locks on at all times, she verbalizes understanding.   Crystal Fitzpatrick Morphine equivalent is 240.00 MME. Reviewed with Crystal Fitzpatrick her MME, instructed to keep a pain journal. Next month we will re-assess, she verbalizes understanding.   Oral Swab was performed on 05/26/2017, it was consistent.   Pain Inventory Average Pain 5 Pain Right Now 6 My pain is constant, burning, stabbing and aching  In the last 24 hours, has pain interfered with the following? General activity 5 Relation with others 7 Enjoyment of life 7 What TIME of day is your pain at its worst? night Sleep (in general) Poor  Pain is worse with: sitting and inactivity Pain improves with: rest Relief from Meds: 8  Mobility ability to climb steps?  no do you drive?  no use a wheelchair Do you have any goals in this area?  no  Function Do you have any goals in this area?  no  Neuro/Psych No problems in this area  Prior Studies Any changes since last visit?  no  Physicians involved in your care Any changes since last visit?  no   Family History  Problem Relation Age of Onset  . Breast cancer Sister   . Diabetes Other   . Stroke Other        Grandparents   Social History    Socioeconomic History  . Marital status: Divorced    Spouse name: None  . Number of children: 1  . Years of education: None  . Highest education level: None  Social Needs  . Financial resource strain: None  . Food insecurity - worry: None  . Food insecurity - inability: None  . Transportation needs - medical: None  . Transportation needs - non-medical: None  Occupational History  . Occupation: retired Oncologist: RETIRED  Tobacco Use  . Smoking status: Former Games developer  . Smokeless tobacco: Never Used  Substance and Sexual Activity  . Alcohol use: No    Alcohol/week: 0.0 oz  . Drug use: No  . Sexual activity: None  Other Topics Concern  . None  Social History Narrative  . None   Past Surgical History:  Procedure Laterality Date  . AMPUTATION  02/02/2008   below right knee  . foot surgury  1980 and 1981  . NASAL SEPTUM SURGERY    . 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 s/p Breast biopsy  1992  . s/p EGD  2007   with Botox for? Achalasia  . TONSILLECTOMY    . TUBAL LIGATION     Past Medical History:  Diagnosis Date  . ABDOMINAL DISTENSION 10/11/2008  . Acute gouty arthropathy 12/11/2008  .  Altered mental status 02/07/2010  . AMPUTATION, BELOW KNEE, RIGHT, HX OF 01/28/2009  . ANAPHYLACTIC SHOCK 05/15/2009  . ANEMIA-NOS 07/17/2008  . ANXIETY 07/17/2008  . ARTHRITIS 01/28/2009  . B12 DEFICIENCY 07/17/2008  . Cellulitis and abscess of leg, except foot 11/15/2008  . CHF 01/28/2009  . Chronic pain syndrome 02/14/2010  . COLONIC POLYPS, HX OF 07/17/2008  . DIABETES MELLITUS, TYPE II 07/17/2008  . Dysuria 11/11/2009  . FOOT PAIN 07/17/2008  . GERD 07/17/2008  . HEARING LOSS, RIGHT EAR 06/04/2009  . HYPERKALEMIA 10/31/2009  . Hyperlipemia 09/24/2010  . Hyperlipidemia 09/24/2010  . HYPERSOMNIA 02/14/2010  . HYPERTENSION 07/17/2008  . HYPONATREMIA 03/20/2010  . Hypoxemia 03/20/2010  . IBS 06/04/2009  . MENOPAUSAL DISORDER 12/03/2009  . NEUROPATHY, HX OF  01/28/2009  . Osteoporosis 11/16/2014  . PERIPHERAL EDEMA 08/30/2008  . Pneumonia, aspiration (HCC) 12/01/2011   Episode July 2013, tx per [facility]  . Pneumonia, organism unspecified(486) 02/14/2010  . RENAL INSUFFICIENCY 10/31/2009  . Rheumatoid arthritis(714.0) 07/17/2008  . Seizure (HCC) 10/26/2010  . SKIN LESION 06/04/2009  . SKIN ULCER, CHRONIC 02/08/2009  . Spinal stenosis of lumbar region 05/15/2015  . Trigeminal neuralgia 02/14/2010  . UNSPECIFIED HEPATITIS 01/28/2009  . Wheezing 12/03/2009   BP 130/84   Pulse 95   SpO2 92%   Opioid Risk Score:  1 Fall Risk Score:  `1  Depression screen PHQ 2/9  Depression screen [facility] 2/9 06/24/2017 05/26/2017 03/04/2017 10/07/2016 01/27/2016 12/26/2015 09/17/2015  Decreased Interest 0 1 1 0 0 0 0  Down, Depressed, Hopeless 0 1 1 0 0 0 0  PHQ - 2 Score 0 2 2 0 0 0 0  Altered sleeping - - - - - - -  Tired, decreased energy - - - - - - -  Change in appetite - - - - - - -  Feeling bad or failure about yourself  - - - - - - -  Trouble concentrating - - - - - - -  Moving slowly or fidgety/restless - - - - - - -  Suicidal thoughts - - - - - - -  PHQ-9 Score - - - - - - -  Difficult doing work/chores - - - - - - -  Some recent data might be hidden     Review of Systems  Constitutional: Negative.   HENT: Negative.   Eyes: Negative.   Respiratory: Negative.   Cardiovascular: Negative.   Gastrointestinal: Negative.   Endocrine: Negative.   Genitourinary: Positive for difficulty urinating, dyspareunia and dysuria.  Musculoskeletal: Negative.   Skin: Negative.   Allergic/Immunologic: Negative.   Neurological: Negative.   Hematological: Negative.   Psychiatric/Behavioral: Negative.   All other systems reviewed and are negative.      Objective:   Physical Exam  Constitutional: She is oriented to person, place, and time. She appears well-developed and well-nourished.  Morbid Obesity  HENT:  Head: Normocephalic and atraumatic.    Neck: Normal range of motion. Neck supple.  Cardiovascular: Normal rate and regular rhythm.  Pulmonary/Chest: Effort normal and breath sounds normal.  Musculoskeletal:  Normal Muscle Bulk and Muscle Testing Reveals:  Upper Extremities: Full ROM and Muscle Strength 5/5  Lumbar Paraspinal Tendereness: L-3-L-5 Lower Extremities: Right: BKA Left: Decreased ROM and Muscle Strength 4/5  Left Lower Extremity Dressing Intact Arrived in Wheelchair   Neurological: She is alert and oriented to person, place, and time.  Skin: Skin is warm and dry.  Stage II Pressure Ulcer  Noted on Left Buttock/ Wound Care following  Psychiatric: She has a normal mood and affect.  Nursing note and vitals reviewed.         Assessment & Plan:  1.Right below-knee amputation, history of phantom limb pain.: Continue Current Medication Regime and Continue to Monitor. 02/07//2019 2. Peripheral neuropathy: Continue Gabapentin and Cymbalta. 06/24/2017 3. Chronic Pain Syndrome: Refilled: Fenatnyl Patch 75 mcg one patch every three days #10 and Continue Oxycodone 10/325mg  one tablet every 6 hours as needed #120. 06/24/2017 We will continue the opioid monitoring program, this consists of regular clinic visits, examinations, urine drug screen, pill counts as well as use of [place] Controlled Substance Reporting System. 4. History of rheumatoid arthritis. Continue Current Medication and Exercise and heat Therapy. 02/07//2019 5. Osteoarthritis of left knee: Continue with Voltaren gel/ Exercise and heat therapy. 06/24/2017 6. Chronic cellulitis, left leg with associated edema:PCP Following/ Attending [facility] weekly. 06/24/2017 7. Lumbar Spondylosis: Continue current medication regime. Continue HEP.06/24/2017 8. Chronic Right  Shoulder Pain:No complaints Today. Continue HEP as Tolerated. 02/07//2019 9. Muscle Spasm: Continue Flexeril. 06/24/2017 10. Stage II Pressure Ulcer: [facility]  Following. 06/24/2017  30 minutes of face to face patient care time was spent during this visit. All questions were encouraged and answered.  F/U in 1 month

## 2017-06-30 ENCOUNTER — Encounter: Payer: Self-pay | Admitting: Family Medicine

## 2017-06-30 ENCOUNTER — Ambulatory Visit (INDEPENDENT_AMBULATORY_CARE_PROVIDER_SITE_OTHER): Payer: Medicare Other | Admitting: Family Medicine

## 2017-06-30 VITALS — BP 120/64 | HR 75 | Temp 98.6°F

## 2017-06-30 DIAGNOSIS — N390 Urinary tract infection, site not specified: Secondary | ICD-10-CM

## 2017-06-30 DIAGNOSIS — R300 Dysuria: Secondary | ICD-10-CM

## 2017-06-30 LAB — POCT URINALYSIS DIPSTICK
Bilirubin, UA: NEGATIVE
Glucose, UA: NEGATIVE
KETONES UA: NEGATIVE
Nitrite, UA: NEGATIVE
Urobilinogen, UA: 2 E.U./dL — AB
pH, UA: 6 (ref 5.0–8.0)

## 2017-06-30 MED ORDER — CEPHALEXIN 500 MG PO CAPS: 500.0000 mg | ORAL_CAPSULE | Freq: Three times a day (TID) | ORAL | 0 refills | Status: AC

## 2017-06-30 MED ORDER — EPINEPHRINE 0.3 MG/0.3ML IJ SOAJ
0.3000 mg | Freq: Once | INTRAMUSCULAR | 5 refills | Status: AC
Start: 2017-06-30 — End: 2017-06-30

## 2017-06-30 NOTE — Progress Notes (Signed)
   Subjective:    Patient ID: Crystal Fitzpatrick, female    DOB: [birth year], 72 y.o.   MRN: [id number]  HPI Here for a possible UTI. For the past 3 days she has had increased urgency to urinate and her urine has been very cloudy. No fever. She has been on Augmentin for what sounds like a cellulitis in the left foot and lower leg. She is cared for by a wound clinic at [facility].    Review of Systems  Constitutional: Negative.   Respiratory: Negative.   Cardiovascular: Negative.   Gastrointestinal: Negative.   Genitourinary: Positive for frequency and urgency. Negative for dysuria.       Objective:   Physical Exam  Constitutional: She appears well-developed and well-nourished.  Cardiovascular: Normal rate, regular rhythm, normal heart sounds and intact distal pulses.  Pulmonary/Chest: Effort normal and breath sounds normal. No respiratory distress. She has no wheezes. She has no rales.  Abdominal: Soft. Bowel sounds are normal. She exhibits no distension and no mass. There is no tenderness. There is no rebound and no guarding.          Assessment & Plan:  UTI, treat with Keflex. Culture the sample. Drink plenty of water.  Gershon Crane, MD

## 2017-07-02 LAB — URINE CULTURE
MICRO NUMBER: 90193403
SPECIMEN QUALITY: ADEQUATE

## 2017-07-16 ENCOUNTER — Encounter: Payer: Self-pay | Admitting: Registered Nurse

## 2017-07-16 ENCOUNTER — Encounter: Payer: Medicare Other | Attending: Physical Medicine and Rehabilitation | Admitting: Registered Nurse

## 2017-07-16 VITALS — BP 118/80 | HR 83

## 2017-07-16 DIAGNOSIS — Z5181 Encounter for therapeutic drug level monitoring: Secondary | ICD-10-CM | POA: Diagnosis not present

## 2017-07-16 DIAGNOSIS — G894 Chronic pain syndrome: Secondary | ICD-10-CM | POA: Diagnosis not present

## 2017-07-16 DIAGNOSIS — M4716 Other spondylosis with myelopathy, lumbar region: Secondary | ICD-10-CM | POA: Diagnosis present

## 2017-07-16 DIAGNOSIS — M48061 Spinal stenosis, lumbar region without neurogenic claudication: Secondary | ICD-10-CM

## 2017-07-16 DIAGNOSIS — G546 Phantom limb syndrome with pain: Secondary | ICD-10-CM | POA: Diagnosis not present

## 2017-07-16 DIAGNOSIS — G609 Hereditary and idiopathic neuropathy, unspecified: Secondary | ICD-10-CM | POA: Diagnosis present

## 2017-07-16 DIAGNOSIS — Z79899 Other long term (current) drug therapy: Secondary | ICD-10-CM | POA: Diagnosis not present

## 2017-07-16 DIAGNOSIS — Z89519 Acquired absence of unspecified leg below knee: Secondary | ICD-10-CM | POA: Diagnosis present

## 2017-07-16 DIAGNOSIS — M069 Rheumatoid arthritis, unspecified: Secondary | ICD-10-CM | POA: Diagnosis present

## 2017-07-16 DIAGNOSIS — M179 Osteoarthritis of knee, unspecified: Secondary | ICD-10-CM | POA: Diagnosis present

## 2017-07-16 MED ORDER — FENTANYL 75 MCG/HR TD PT72: 75.0000 ug | MEDICATED_PATCH | TRANSDERMAL | 0 refills | Status: DC

## 2017-07-16 MED ORDER — OXYCODONE-ACETAMINOPHEN 10-325 MG PO TABS: 1.0000 | ORAL_TABLET | Freq: Four times a day (QID) | ORAL | 0 refills | Status: DC | PRN

## 2017-07-16 NOTE — Progress Notes (Signed)
Subjective:    Patient ID: Crystal Fitzpatrick, female    DOB: [birth year], 72 y.o.   MRN: [id number]  HPI: Crystal Fitzpatrick is a 72year old female who returns for follow up appointmentfor chronic pain and medication refill. She states her pain is located in her lower back, buttock ( sacral decubitus) and left foot. She rates her pain 6. Her current exercise regime is performing stretching exercises daily (Yoga).   Crystal Fitzpatrick going to [facility], and  [facility] changing her dressing twice a week.    Crystal Fitzpatrick Morphine equivalent is 212.00 MME.   Oral Swab was performed on 05/26/2017, it was consistent.   Pain Inventory Average Pain 7 Pain Right Now 6 My pain is constant, burning, stabbing and aching  In the last 24 hours, has pain interfered with the following? General activity 6 Relation with others 5 Enjoyment of life 4 What TIME of day is your pain at its worst? night Sleep (in general) Poor  Pain is worse with: sitting and inactivity Pain improves with: rest Relief from Meds: 6  Mobility ability to climb steps?  no do you drive?  no use a wheelchair Do you have any goals in this area?  no  Function Do you have any goals in this area?  no  Neuro/Psych No problems in this area  Prior Studies Any changes since last visit?  no  Physicians involved in your care Any changes since last visit?  no   Family History  Problem Relation Age of Onset  . Breast cancer Sister   . Diabetes Other   . Stroke Other        Grandparents   Social History   Socioeconomic History  . Marital status: Divorced    Spouse name: None  . Number of children: 1  . Years of education: None  . Highest education level: None  Social Needs  . Financial resource strain: None  . Food insecurity - worry: None  . Food insecurity - inability: None  . Transportation needs - medical: None  . Transportation needs - non-medical: None  Occupational History  .  Occupation: retired Oncologist: RETIRED  Tobacco Use  . Smoking status: Former Games developer  . Smokeless tobacco: Never Used  Substance and Sexual Activity  . Alcohol use: No    Alcohol/week: 0.0 oz  . Drug use: No  . Sexual activity: None  Other Topics Concern  . None  Social History Narrative  . None   Past Surgical History:  Procedure Laterality Date  . AMPUTATION  02/02/2008   below right knee  . foot surgury  1980 and 1981  . NASAL SEPTUM SURGERY    . 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 s/p Breast biopsy  1992  . s/p EGD  2007   with Botox for? Achalasia  . TONSILLECTOMY    . TUBAL LIGATION     Past Medical History:  Diagnosis Date  . ABDOMINAL DISTENSION 10/11/2008  . Acute gouty arthropathy 12/11/2008  . Altered mental status 02/07/2010  . AMPUTATION, BELOW KNEE, RIGHT, HX OF 01/28/2009  . ANAPHYLACTIC SHOCK 05/15/2009  . ANEMIA-NOS 07/17/2008  . ANXIETY 07/17/2008  . ARTHRITIS 01/28/2009  . B12 DEFICIENCY 07/17/2008  . Cellulitis and abscess of leg, except foot 11/15/2008  . CHF 01/28/2009  . Chronic pain syndrome 02/14/2010  . COLONIC POLYPS, HX OF 07/17/2008  . DIABETES MELLITUS, TYPE II  07/17/2008  . Dysuria 11/11/2009  . FOOT PAIN 07/17/2008  . GERD 07/17/2008  . HEARING LOSS, RIGHT EAR 06/04/2009  . HYPERKALEMIA 10/31/2009  . Hyperlipemia 09/24/2010  . Hyperlipidemia 09/24/2010  . HYPERSOMNIA 02/14/2010  . HYPERTENSION 07/17/2008  . HYPONATREMIA 03/20/2010  . Hypoxemia 03/20/2010  . IBS 06/04/2009  . MENOPAUSAL DISORDER 12/03/2009  . NEUROPATHY, HX OF 01/28/2009  . Osteoporosis 11/16/2014  . PERIPHERAL EDEMA 08/30/2008  . Pneumonia, aspiration (HCC) 12/01/2011   Episode July 2013, tx per [facility]  . Pneumonia, organism unspecified(486) 02/14/2010  . RENAL INSUFFICIENCY 10/31/2009  . Rheumatoid arthritis(714.0) 07/17/2008  . Seizure (HCC) 10/26/2010  . SKIN LESION 06/04/2009  . SKIN ULCER, CHRONIC 02/08/2009  . Spinal stenosis of  lumbar region 05/15/2015  . Trigeminal neuralgia 02/14/2010  . UNSPECIFIED HEPATITIS 01/28/2009  . Wheezing 12/03/2009   There were no vitals taken for this visit.  Opioid Risk Score:  1 Fall Risk Score:  `1  Depression screen PHQ 2/9  Depression screen [facility] 2/9 06/24/2017 05/26/2017 03/04/2017 10/07/2016 01/27/2016 12/26/2015 09/17/2015  Decreased Interest 0 1 1 0 0 0 0  Down, Depressed, Hopeless 0 1 1 0 0 0 0  PHQ - 2 Score 0 2 2 0 0 0 0  Altered sleeping - - - - - - -  Tired, decreased energy - - - - - - -  Change in appetite - - - - - - -  Feeling bad or failure about yourself  - - - - - - -  Trouble concentrating - - - - - - -  Moving slowly or fidgety/restless - - - - - - -  Suicidal thoughts - - - - - - -  PHQ-9 Score - - - - - - -  Difficult doing work/chores - - - - - - -  Some recent data might be hidden     Review of Systems  Constitutional: Negative.   HENT: Negative.   Eyes: Negative.   Respiratory: Negative.   Cardiovascular: Negative.   Gastrointestinal: Negative.   Endocrine: Negative.   Genitourinary: Negative.   Musculoskeletal: Positive for arthralgias, back pain, gait problem and myalgias.  Skin: Negative.   Allergic/Immunologic: Negative.   Hematological: Negative.   Psychiatric/Behavioral: Negative.   All other systems reviewed and are negative.      Objective:   Physical Exam  Constitutional: She is oriented to person, place, and time. She appears well-developed and well-nourished.  Morbid Obesity  HENT:  Head: Normocephalic and atraumatic.  Neck: Normal range of motion. Neck supple.  Cervical Paraspinal Tenderness: C-5-C-6  Cardiovascular: Normal rate and regular rhythm.  Pulmonary/Chest: Effort normal and breath sounds normal.  Musculoskeletal:  Normal Muscle Bulk and Muscle Testing Reveals:  Upper Extremities: Full ROM and Muscle Strength 5/5 Lumbar Paraspinal [place]reness: L-3-L-5 Lower Extremities: Right: BKA Left: Decreased ROM and Muscle  Strength 4/5  Left Lower Extremity Coban Wrap Dressing Intact Arrived in Wheelchair   Neurological: She is alert and oriented to person, place, and time.  Skin: Skin is warm and dry.  Stage II Pressure Ulcer Noted on Left Buttock/ Wound Care following  Psychiatric: She has a normal mood and affect.  Nursing note and vitals reviewed.         Assessment & Plan:  1.Right below-knee amputation, history of phantom limb pain.: Continue Current Medication Regime and Continue to Monitor. 03/01//2019 2. Peripheral neuropathy: Continue Gabapentin and Cymbalta. 07/16/2017 3. Chronic Pain Syndrome: Refilled: Fenatnyl Patch 75 mcg  one patch every three days #10 and Continue Oxycodone 10/325mg  one tablet every 6 hours as needed #120. 07/16/2017 We will continue the opioid monitoring program, this consists of regular clinic visits, examinations, urine drug screen, pill counts as well as use of [place] Controlled Substance Reporting System. 4. History of rheumatoid arthritis. Continue Current Medication and Exercise and heat Therapy. 03/01//2019 5. Osteoarthritis of left knee: Continue with Voltaren gel/ Exercise and heat therapy. 07/16/2017 6. Chronic cellulitis, left leg ulcer with associated edema:PCP Following/ Attending [facility] weekly and [facility] changing dressing twice a week. 07/16/2017 7. Lumbar Spondylosis: Continue current medication regime. Continue HEP.07/16/2017 8. Chronic Right  Shoulder Pain:No complaints Today. Continue HEP as Tolerated. 03/01//2019 9. Muscle Spasm: Continue Flexeril. 07/16/2017 10. 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 07/16/2017  30 minutes of face to face patient care time was spent during this visit. All questions were encouraged and answered.  F/U in 1 month

## 2017-07-29 ENCOUNTER — Other Ambulatory Visit: Payer: Self-pay | Admitting: Registered Nurse

## 2017-08-10 ENCOUNTER — Ambulatory Visit: Payer: Medicare Other | Admitting: Physical Therapy

## 2017-08-13 MED ORDER — VITAMIN C 500 MG PO TABS
500.00 | ORAL_TABLET | ORAL | Status: DC
Start: 2017-08-14 — End: 2017-08-13

## 2017-08-13 MED ORDER — TOPIRAMATE 100 MG PO TABS
200.00 | ORAL_TABLET | ORAL | Status: DC
Start: 2017-08-13 — End: 2017-08-13

## 2017-08-13 MED ORDER — FENTANYL 75 MCG/HR TD PT72
1.00 | MEDICATED_PATCH | TRANSDERMAL | Status: DC
Start: 2017-08-15 — End: 2017-08-13

## 2017-08-13 MED ORDER — ATORVASTATIN CALCIUM 10 MG PO TABS
10.00 | ORAL_TABLET | ORAL | Status: DC
Start: 2017-08-13 — End: 2017-08-13

## 2017-08-13 MED ORDER — GABAPENTIN 400 MG PO CAPS
400.00 | ORAL_CAPSULE | ORAL | Status: DC
Start: 2017-08-13 — End: 2017-08-13

## 2017-08-13 MED ORDER — CLINDAMYCIN HCL 150 MG PO CAPS
300.00 | ORAL_CAPSULE | ORAL | Status: DC
Start: 2017-08-13 — End: 2017-08-13

## 2017-08-13 MED ORDER — ACETAMINOPHEN 325 MG PO TABS
650.00 | ORAL_TABLET | ORAL | Status: DC
Start: ? — End: 2017-08-13

## 2017-08-13 MED ORDER — ONDANSETRON HCL 4 MG/2ML IJ SOLN
4.00 | INTRAMUSCULAR | Status: DC
Start: ? — End: 2017-08-13

## 2017-08-13 MED ORDER — OXYCODONE-ACETAMINOPHEN 5-325 MG PO TABS
1.00 | ORAL_TABLET | ORAL | Status: DC
Start: ? — End: 2017-08-13

## 2017-08-13 MED ORDER — ENOXAPARIN SODIUM 40 MG/0.4ML ~~LOC~~ SOLN
40.00 | SUBCUTANEOUS | Status: DC
Start: 2017-08-13 — End: 2017-08-13

## 2017-08-13 MED ORDER — NYSTATIN 100000 UNIT/GM EX CREA
TOPICAL_CREAM | CUTANEOUS | Status: DC
Start: 2017-08-13 — End: 2017-08-13

## 2017-08-13 MED ORDER — CLOPIDOGREL BISULFATE 75 MG PO TABS
75.00 | ORAL_TABLET | ORAL | Status: DC
Start: 2017-08-14 — End: 2017-08-13

## 2017-08-13 MED ORDER — DULOXETINE HCL 20 MG PO CPEP
40.00 | ORAL_CAPSULE | ORAL | Status: DC
Start: 2017-08-14 — End: 2017-08-13

## 2017-08-13 MED ORDER — SODIUM BICARBONATE 650 MG PO TABS
650.00 | ORAL_TABLET | ORAL | Status: DC
Start: 2017-08-13 — End: 2017-08-13

## 2017-08-13 MED ORDER — OXYBUTYNIN CHLORIDE 5 MG PO TABS
5.00 | ORAL_TABLET | ORAL | Status: DC
Start: 2017-08-13 — End: 2017-08-13

## 2017-08-13 MED ORDER — HYDROXYZINE HCL 25 MG PO TABS
25.00 | ORAL_TABLET | ORAL | Status: DC
Start: ? — End: 2017-08-13

## 2017-08-13 MED ORDER — GENERIC EXTERNAL MEDICATION
30.00 | Status: DC
Start: 2017-08-13 — End: 2017-08-13

## 2017-08-16 ENCOUNTER — Encounter: Payer: Medicare Other | Admitting: Physical Medicine & Rehabilitation

## 2017-08-18 ENCOUNTER — Telehealth: Payer: Self-pay | Admitting: Family Medicine

## 2017-08-18 NOTE — Telephone Encounter (Signed)
Copied from CRM [id number]. Topic: Inquiry >> Aug 18, 2017 12:11 PM Jonette Eva wrote: Reason for CRM: Schuyler Amor  [phone] ext [phone]; she if from [facility] and she faxed over a FL-2 form, she states the pt is in need of urgent home skilled nursing, call [facility] Mole if anything is needed

## 2017-08-18 NOTE — Telephone Encounter (Signed)
Sent to PCP to advise 

## 2017-08-19 ENCOUNTER — Other Ambulatory Visit: Payer: Self-pay

## 2017-08-19 ENCOUNTER — Encounter: Payer: Medicare Other | Attending: Physical Medicine and Rehabilitation | Admitting: Registered Nurse

## 2017-08-19 ENCOUNTER — Encounter: Payer: Self-pay | Admitting: Registered Nurse

## 2017-08-19 VITALS — BP 125/76 | HR 77 | Ht 70.0 in | Wt 230.0 lb

## 2017-08-19 DIAGNOSIS — M4716 Other spondylosis with myelopathy, lumbar region: Secondary | ICD-10-CM | POA: Insufficient documentation

## 2017-08-19 DIAGNOSIS — Z5181 Encounter for therapeutic drug level monitoring: Secondary | ICD-10-CM | POA: Diagnosis not present

## 2017-08-19 DIAGNOSIS — M48061 Spinal stenosis, lumbar region without neurogenic claudication: Secondary | ICD-10-CM | POA: Diagnosis not present

## 2017-08-19 DIAGNOSIS — G546 Phantom limb syndrome with pain: Secondary | ICD-10-CM | POA: Diagnosis not present

## 2017-08-19 DIAGNOSIS — G609 Hereditary and idiopathic neuropathy, unspecified: Secondary | ICD-10-CM

## 2017-08-19 DIAGNOSIS — Z89519 Acquired absence of unspecified leg below knee: Secondary | ICD-10-CM

## 2017-08-19 DIAGNOSIS — M47816 Spondylosis without myelopathy or radiculopathy, lumbar region: Secondary | ICD-10-CM | POA: Diagnosis not present

## 2017-08-19 DIAGNOSIS — L03116 Cellulitis of left lower limb: Secondary | ICD-10-CM | POA: Diagnosis not present

## 2017-08-19 DIAGNOSIS — M069 Rheumatoid arthritis, unspecified: Secondary | ICD-10-CM | POA: Diagnosis present

## 2017-08-19 DIAGNOSIS — Z79899 Other long term (current) drug therapy: Secondary | ICD-10-CM

## 2017-08-19 DIAGNOSIS — M179 Osteoarthritis of knee, unspecified: Secondary | ICD-10-CM | POA: Diagnosis present

## 2017-08-19 DIAGNOSIS — G894 Chronic pain syndrome: Secondary | ICD-10-CM

## 2017-08-19 MED ORDER — FENTANYL 75 MCG/HR TD PT72: 75.0000 ug | MEDICATED_PATCH | TRANSDERMAL | 0 refills | Status: DC

## 2017-08-19 MED ORDER — OXYCODONE-ACETAMINOPHEN 10-325 MG PO TABS: 1.0000 | ORAL_TABLET | Freq: Four times a day (QID) | ORAL | 0 refills | Status: DC | PRN

## 2017-08-19 NOTE — Progress Notes (Signed)
Subjective:    Patient ID: Crystal Fitzpatrick, female    DOB: May 12, 1946, 72 y.o.   MRN: [id number]  HPI: [name] is a 72 year old female who returns for follow up appointment and medication refill. She states her pain is located in her upper-lower back and left leg. She rates her pain 7. Her current exercise regime is performing Yoga daily.   [name] is 264.00. [name] last Oral Swab was Performed on 05/26/2017 it was consistent.   [name] reports she is going to [facility] Weekly and [facility] Nurses changing her dressing twice a week.   [name] was hospitalized at [facility] admitted on 08/08/2017 and discharged on 08/13/2017 for Cellulitis of Left Lower Extremity, note reviewed.   Pain Inventory Average Pain 7 Pain Right Now 7 My pain is burning, dull and stabbing  In the last 24 hours, has pain interfered with the following? General activity 6 Relation with others 7 Enjoyment of life 8 What TIME of day is your pain at its worst? morning night Sleep (in general) Poor  Pain is worse with: sitting and inactivity Pain improves with: rest, heat/ice, therapy/exercise and medication Relief from Meds: 7  Mobility Do you have any goals in this area?  no  Function Do you have any goals in this area?  no  Neuro/Psych No problems in this area  Prior Studies Any changes since last visit?  no  Physicians involved in your care Any changes since last visit?  no   Family History  Problem Relation Age of Onset  . Breast cancer Sister   . Diabetes Other   . Stroke Other        Grandparents   Social History   Socioeconomic History  . Marital status: Divorced    Spouse name: Not on file  . Number of children: 1  . Years of education: Not on file  . Highest education level: Not on file  Occupational History  . Occupation: retired Oncologist: RETIRED  Social Needs    . Financial resource strain: Not on file  . Food insecurity:    Worry: Not on file    Inability: Not on file  . Transportation needs:    Medical: Not on file    Non-medical: Not on file  Tobacco Use  . Smoking status: Former Games developer  . Smokeless tobacco: Never Used  Substance and Sexual Activity  . Alcohol use: No    Alcohol/week: 0.0 oz  . Drug use: No  . Sexual activity: Not on file  Lifestyle  . Physical activity:    Days per week: Not on file    Minutes per session: Not on file  . Stress: Not on file  Relationships  . Social connections:    Talks on phone: Not on file    Gets together: Not on file    Attends religious service: Not on file    Active member of club or organization: Not on file    Attends meetings of clubs or organizations: Not on file    Relationship status: Not on file  Other Topics Concern  . Not on file  Social History Narrative  . Not on file   Past Surgical History:  Procedure Laterality Date  . AMPUTATION  02/02/2008   below right knee  . foot surgury  1980 and 1981  . NASAL SEPTUM SURGERY    .  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 s/p Breast biopsy  1992  . s/p EGD  2007   with Botox for? Achalasia  . TONSILLECTOMY    . TUBAL LIGATION     Past Medical History:  Diagnosis Date  . ABDOMINAL DISTENSION 10/11/2008  . Acute gouty arthropathy 12/11/2008  . Altered mental status 02/07/2010  . AMPUTATION, BELOW KNEE, RIGHT, HX OF 01/28/2009  . ANAPHYLACTIC SHOCK 05/15/2009  . ANEMIA-NOS 07/17/2008  . ANXIETY 07/17/2008  . ARTHRITIS 01/28/2009  . B12 DEFICIENCY 07/17/2008  . Cellulitis and abscess of leg, except foot 11/15/2008  . CHF 01/28/2009  . Chronic pain syndrome 02/14/2010  . COLONIC POLYPS, HX OF 07/17/2008  . DIABETES MELLITUS, TYPE II 07/17/2008  . Dysuria 11/11/2009  . FOOT PAIN 07/17/2008  . GERD 07/17/2008  . HEARING LOSS, RIGHT EAR 06/04/2009  . HYPERKALEMIA 10/31/2009  . Hyperlipemia 09/24/2010  . Hyperlipidemia 09/24/2010  . HYPERSOMNIA  02/14/2010  . HYPERTENSION 07/17/2008  . HYPONATREMIA 03/20/2010  . Hypoxemia 03/20/2010  . IBS 06/04/2009  . MENOPAUSAL DISORDER 12/03/2009  . NEUROPATHY, HX OF 01/28/2009  . Osteoporosis 11/16/2014  . PERIPHERAL EDEMA 08/30/2008  . Pneumonia, aspiration (HCC) 12/01/2011   Episode July 2013, tx per [facility]  . Pneumonia, organism unspecified(486) 02/14/2010  . RENAL INSUFFICIENCY 10/31/2009  . Rheumatoid arthritis(714.0) 07/17/2008  . Seizure (HCC) 10/26/2010  . SKIN LESION 06/04/2009  . SKIN ULCER, CHRONIC 02/08/2009  . Spinal stenosis of lumbar region 05/15/2015  . Trigeminal neuralgia 02/14/2010  . UNSPECIFIED HEPATITIS 01/28/2009  . Wheezing 12/03/2009   BP 125/76   Pulse 77   Ht 5\' 10"  (1.778 m) Comment: pt reported  Wt 230 lb (104.3 kg) Comment: pt reported  SpO2 95%   BMI 33.00 kg/m   Opioid Risk Score:   Fall Risk Score:  `1  Depression screen PHQ 2/9  Depression screen [facility] 2/9 08/19/2017 06/24/2017 05/26/2017 03/04/2017 10/07/2016 01/27/2016 12/26/2015  Decreased Interest 1 0 1 1 0 0 0  Down, Depressed, Hopeless 1 0 1 1 0 0 0  PHQ - 2 Score 2 0 2 2 0 0 0  Altered sleeping 0 - - - - - -  Tired, decreased energy 1 - - - - - -  Change in appetite 1 - - - - - -  Feeling bad or failure about yourself  1 - - - - - -  Trouble concentrating 0 - - - - - -  Moving slowly or fidgety/restless 0 - - - - - -  Suicidal thoughts 0 - - - - - -  PHQ-9 Score 5 - - - - - -  Difficult doing work/chores - - - - - - -  Some recent data might be hidden    Review of Systems  Constitutional: Negative.   HENT: Negative.   Eyes: Negative.   Respiratory: Negative.   Cardiovascular: Negative.   Gastrointestinal: Negative.   Endocrine: Negative.   Genitourinary: Negative.   Musculoskeletal: Negative.   Skin: Negative.   Allergic/Immunologic: Negative.   Neurological: Negative.   Hematological: Negative.   Psychiatric/Behavioral: Negative.   All other systems reviewed and are  negative.      Objective:   Physical Exam  Constitutional: She is oriented to person, place, and time. She appears well-developed and well-nourished.  HENT:  Head: Normocephalic and atraumatic.  Neck: Normal range of motion. Neck supple.  Cardiovascular: Normal rate and regular rhythm.  Pulmonary/Chest: Effort  normal and breath sounds normal.  Musculoskeletal:  Normal Muscle Bulk and Muscle Testing Reveals: Upper Extremities: Full ROM and Muscle Strength 5/5 Thoracic Paraspinal Tenderness: T-1-T-3 T-7-T-9 Lumbar Paraspinal Tenderness: L-3-L-5 Lower Extremities: Right: BKA Left: Coban Wrap Dressing Intact Arrived in wheelchair   Neurological: She is alert and oriented to person, place, and time.  Skin: Skin is warm and dry.  Psychiatric: She has a normal mood and affect.  Nursing note and vitals reviewed.         Assessment & Plan:  1.Right below-knee amputation, history of phantom limb pain.: Continue with  Current Medication Regime and Continue to Monitor. 04/04//2019 2. Peripheral neuropathy: Continue with current treatment regimen:  Gabapentin, Topamax and Cymbalta. 08/19/2017 3. Chronic Pain Syndrome: Continue Current Treatment Regimen:  Refilled: Fenatnyl Patch 75 mcg one patch every three days #10 and Continue Oxycodone 10/325mg  one tablet every 6 hours as needed #120. 08/19/2017 We will continue the opioid monitoring program, this consists of regular clinic visits, examinations, urine drug screen, pill counts as well as use of [place] Controlled Substance Reporting System. 4. History of rheumatoid arthritis. Continue Current Medication and Exercise and heat Therapy. 04/04//2019 5. Osteoarthritis of left knee: Continue current treatment regimen with:  Voltaren gel/ Exercise and heat therapy. 08/19/2017 6. Chronic cellulitis, left leg ulcer with associated edema:Wound Care Following: Attending [facility] weekly and [facility] changing dressing  twice a week. 08/19/2017 7. Lumbar Spondylosis: Continue current medication regime. Continue HEP as tolerated. Continue to Monitor. [name]04/08/2017 8. Chronic Right  Shoulder Pain:No complaints Today. Continue HEP as Tolerated. 04/04//2019 9. Muscle Spasm: Continue current treatment regimen with Flexeril. 08/19/2017 10. 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 08/19/2017  30 minutes of face to face patient care time was spent during this visit. All questions were encouraged and answered.  F/U in 1 month

## 2017-08-19 NOTE — Telephone Encounter (Signed)
This was completed and is ready to fax

## 2017-08-20 NOTE — Telephone Encounter (Signed)
Alice called with fax number    [phone]  And asks this be faxed asap

## 2017-08-20 NOTE — Telephone Encounter (Signed)
Sent to be faxed and placed to be scanned into pt's chart.

## 2017-08-20 NOTE — Telephone Encounter (Signed)
I refaxed form to below number and printed a confirmation.

## 2017-09-03 ENCOUNTER — Telehealth: Payer: Self-pay | Admitting: Family Medicine

## 2017-09-03 NOTE — Telephone Encounter (Signed)
Copied from CRM [id number]. Topic: Quick Communication - See Telephone Encounter >> Sep 03, 2017 12:50 PM Raquel Sarna wrote: Clydie Towers w/ [facility] - [phone]  Clydie Sebree is in pt home.  Pt complaining of back pain and  Clydie Mcmillon states the pt has shingles.  Appt w/ Dr Clent Ridges has been scheduled for Tues 09-07-17.

## 2017-09-07 ENCOUNTER — Ambulatory Visit (INDEPENDENT_AMBULATORY_CARE_PROVIDER_SITE_OTHER): Payer: Medicare Other | Admitting: Family Medicine

## 2017-09-07 ENCOUNTER — Encounter: Payer: Self-pay | Admitting: Family Medicine

## 2017-09-07 VITALS — BP 120/62 | HR 84 | Temp 89.0°F | Ht 70.0 in

## 2017-09-07 DIAGNOSIS — B029 Zoster without complications: Secondary | ICD-10-CM

## 2017-09-07 MED ORDER — VALACYCLOVIR HCL 1 G PO TABS: 1000.0000 mg | ORAL_TABLET | Freq: Three times a day (TID) | ORAL | 0 refills | Status: DC

## 2017-09-07 NOTE — Progress Notes (Signed)
   Subjective:    Patient ID: Crystal Fitzpatrick, female    DOB: 1945/07/18, 72 y.o.   MRN: [id number]  HPI duplicate   Review of Systems     Objective:   Physical Exam        Assessment & Plan:

## 2017-09-07 NOTE — Progress Notes (Signed)
   Subjective:    Patient ID: Crystal Fitzpatrick, female    DOB: 03-08-1946, 72 y.o.   MRN: [id number]  HPI Here for 10 days of a rash on the left trunk that itches and burns. She had shingles once before many years ago.   Review of Systems  Constitutional: Negative.   Respiratory: Negative.   Cardiovascular: Negative.   Skin: Positive for rash.       Objective:   Physical Exam  Constitutional: She is oriented to person, place, and time. She appears well-developed and well-nourished.  Cardiovascular: Normal rate, regular rhythm, normal heart sounds and intact distal pulses.  Pulmonary/Chest: Effort normal and breath sounds normal. No respiratory distress. She has no wheezes. She has no rales.  Neurological: She is alert and oriented to person, place, and time.  Skin:  There is a band of red papular and vesicular lesions on the left trunk from the front midline to the back midline           Assessment & Plan:  Shingles, treat with Valtrex.  Gershon Crane, MD

## 2017-09-13 ENCOUNTER — Other Ambulatory Visit: Payer: Self-pay | Admitting: Registered Nurse

## 2017-09-14 ENCOUNTER — Encounter (HOSPITAL_BASED_OUTPATIENT_CLINIC_OR_DEPARTMENT_OTHER): Payer: Medicare Other | Admitting: Physical Medicine & Rehabilitation

## 2017-09-14 ENCOUNTER — Encounter: Payer: Self-pay | Admitting: Physical Medicine & Rehabilitation

## 2017-09-14 VITALS — BP 102/68 | HR 89

## 2017-09-14 DIAGNOSIS — G609 Hereditary and idiopathic neuropathy, unspecified: Secondary | ICD-10-CM | POA: Diagnosis not present

## 2017-09-14 DIAGNOSIS — G894 Chronic pain syndrome: Secondary | ICD-10-CM

## 2017-09-14 DIAGNOSIS — M47816 Spondylosis without myelopathy or radiculopathy, lumbar region: Secondary | ICD-10-CM | POA: Diagnosis not present

## 2017-09-14 DIAGNOSIS — Z89519 Acquired absence of unspecified leg below knee: Secondary | ICD-10-CM | POA: Diagnosis not present

## 2017-09-14 DIAGNOSIS — G546 Phantom limb syndrome with pain: Secondary | ICD-10-CM | POA: Diagnosis not present

## 2017-09-14 DIAGNOSIS — B029 Zoster without complications: Secondary | ICD-10-CM | POA: Diagnosis not present

## 2017-09-14 DIAGNOSIS — B0229 Other postherpetic nervous system involvement: Secondary | ICD-10-CM

## 2017-09-14 DIAGNOSIS — M4716 Other spondylosis with myelopathy, lumbar region: Secondary | ICD-10-CM

## 2017-09-14 MED ORDER — GABAPENTIN 600 MG PO TABS: 600.0000 mg | ORAL_TABLET | Freq: Four times a day (QID) | ORAL | 2 refills | Status: DC

## 2017-09-14 MED ORDER — FENTANYL 75 MCG/HR TD PT72: 75.0000 ug | MEDICATED_PATCH | TRANSDERMAL | 0 refills | Status: DC

## 2017-09-14 MED ORDER — OXYCODONE-ACETAMINOPHEN 10-325 MG PO TABS: 1.0000 | ORAL_TABLET | Freq: Four times a day (QID) | ORAL | 0 refills | Status: DC | PRN

## 2017-09-14 MED ORDER — LIDOCAINE 5 % EX PTCH: 1.0000 | MEDICATED_PATCH | CUTANEOUS | 2 refills | Status: AC

## 2017-09-14 NOTE — Progress Notes (Signed)
Subjective:    Patient ID: Crystal Fitzpatrick, female    DOB: 05/09/1946, 72 y.o.   MRN: [id number]  HPI   Crystal Fitzpatrick is here in follow up of her chronic pain. She came down with a case of the shingles three weeks ago and is still dealing with pain and the associated lesions. She received a course of valtrex. A few lesions are still draining. Her pain is continuous and throbbing. She is still dealing with pain in her legs and chronic leg wounds.   She remains on gabapentin for her nerve pain (and shingles), typically 600mg  TID. It does seem to help her pain when she takes it. She also is still taking cymbalta 60mg  daily as well as her fentanyl patch with percocet for breakthrough pain.   She tells me that her left leg wounds have healed but that she is still dealing with chronic edema, weeping and cellulitis however.   She is using a manual w/c as her primary means of mobility.    Pain Inventory Average Pain 5 Pain Right Now 7 My pain is constant, sharp, burning, dull, stabbing, tingling and aching  In the last 24 hours, has pain interfered with the following? General activity 5 Relation with others 5 Enjoyment of life 5 What TIME of day is your pain at its worst? night Sleep (in general) Poor  Pain is worse with: some activites Pain improves with: medication Relief from Meds: 7  Mobility use a wheelchair needs help with transfers  Function disabled: date disabled .  Neuro/Psych bladder control problems trouble walking spasms anxiety  Prior Studies Any changes since last visit?  no  Physicians involved in your care Any changes since last visit?  no   Family History  Problem Relation Age of Onset  . Breast cancer Sister   . Diabetes Other   . Stroke Other        Grandparents   Social History   Socioeconomic History  . Marital status: Divorced    Spouse name: Not on file  . Number of children: 1  . Years of education: Not on file  . Highest education  level: Not on file  Occupational History  . Occupation: retired : RETIRED  Social Needs  . Financial resource strain: Not on file  . Food insecurity:    Worry: Not on file    Inability: Not on file  . Transportation needs:    Medical: Not on file    Non-medical: Not on file  Tobacco Use  . Smoking status: Former  . Smokeless tobacco: Never Used  Substance and Sexual Activity  . Alcohol use: No    Alcohol/week: 0.0 oz  . Drug use: No  . Sexual activity: Not on file  Lifestyle  . Physical activity:    Days per week: Not on file    Minutes per session: Not on file  . Stress: Not on file  Relationships  . Social connections:    Talks on phone: Not on file    Gets together: Not on file    Attends religious service: Not on file    Active member of club or organization: Not on file    Attends meetings of clubs or organizations: Not on file    Relationship status: Not on file  Other Topics Concern  . Not on file  Social History Narrative  . Not on file   Past Surgical History:  Procedure Laterality Date  .  AMPUTATION  02/02/2008   below right knee  . foot surgury  1980 and 1981  . NASAL SEPTUM SURGERY    . 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 s/p Breast biopsy  1992  . s/p EGD  2007   with Botox for? Achalasia  . TONSILLECTOMY    . TUBAL LIGATION     Past Medical History:  Diagnosis Date  . ABDOMINAL DISTENSION 10/11/2008  . Acute gouty arthropathy 12/11/2008  . Altered mental status 02/07/2010  . AMPUTATION, BELOW KNEE, RIGHT, HX OF 01/28/2009  . ANAPHYLACTIC SHOCK 05/15/2009  . ANEMIA-NOS 07/17/2008  . ANXIETY 07/17/2008  . ARTHRITIS 01/28/2009  . B12 DEFICIENCY 07/17/2008  . Cellulitis and abscess of leg, except foot 11/15/2008  . CHF 01/28/2009  . Chronic pain syndrome 02/14/2010  . COLONIC POLYPS, HX OF 07/17/2008  . DIABETES MELLITUS, TYPE II 07/17/2008  . Dysuria 11/11/2009  . FOOT PAIN 07/17/2008  . GERD 07/17/2008    . HEARING LOSS, RIGHT EAR 06/04/2009  . HYPERKALEMIA 10/31/2009  . Hyperlipemia 09/24/2010  . Hyperlipidemia 09/24/2010  . HYPERSOMNIA 02/14/2010  . HYPERTENSION 07/17/2008  . HYPONATREMIA 03/20/2010  . Hypoxemia 03/20/2010  . IBS 06/04/2009  . MENOPAUSAL DISORDER 12/03/2009  . NEUROPATHY, HX OF 01/28/2009  . Osteoporosis 11/16/2014  . PERIPHERAL EDEMA 08/30/2008  . Pneumonia, aspiration (HCC) 12/01/2011   Episode July 2013, tx per [facility]  . Pneumonia, organism unspecified(486) 02/14/2010  . RENAL INSUFFICIENCY 10/31/2009  . Rheumatoid arthritis(714.0) 07/17/2008  . Seizure (HCC) 10/26/2010  . SKIN LESION 06/04/2009  . SKIN ULCER, CHRONIC 02/08/2009  . Spinal stenosis of lumbar region 05/15/2015  . Trigeminal neuralgia 02/14/2010  . UNSPECIFIED HEPATITIS 01/28/2009  . Wheezing 12/03/2009   There were no vitals taken for this visit.  Opioid Risk Score:   Fall Risk Score:  `1  Depression screen PHQ 2/9  Depression screen [facility] 2/9 08/19/2017 06/24/2017 05/26/2017 03/04/2017 10/07/2016 01/27/2016 12/26/2015  Decreased Interest 1 0 1 1 0 0 0  Down, Depressed, Hopeless 1 0 1 1 0 0 0  PHQ - 2 Score 2 0 2 2 0 0 0  Altered sleeping 0 - - - - - -  Tired, decreased energy 1 - - - - - -  Change in appetite 1 - - - - - -  Feeling bad or failure about yourself  1 - - - - - -  Trouble concentrating 0 - - - - - -  Moving slowly or fidgety/restless 0 - - - - - -  Suicidal thoughts 0 - - - - - -  PHQ-9 Score 5 - - - - - -  Difficult doing work/chores - - - - - - -  Some recent data might be hidden     Review of Systems  Constitutional: Positive for chills and fever.  HENT: Negative.   Eyes: Negative.   Respiratory: Negative.   Cardiovascular: Negative.   Gastrointestinal: Negative.   Endocrine: Negative.   Genitourinary: Positive for difficulty urinating.  Musculoskeletal: Positive for arthralgias, gait problem and myalgias.  Skin: Positive for rash.  Allergic/Immunologic: Negative.    Hematological: Negative.   Psychiatric/Behavioral: The patient is nervous/anxious.   All other systems reviewed and are negative.      Objective:   Physical Exam General: No acute distress HEENT: EOMI, oral membranes moist Cards: reg rate  Chest: normal effort Abdomen: Soft, NT, ND Skin: healing vesicular rash along the left T8/9 dermatome,  some areas still weeping below breast.  Extremities: no edema  Neurological: A sensory deficitto light touch left foot/leg, right leg to PP and LT. UE's: 4/5 deltoids, biceps,triceps 4/5. LLE: 4/5 HF, 3+KE 2/5 ADF/PF. Cognitively she's intact Skin: unna dressing LLE in place. RLE BK intact Psychiatric: Normal mood.     Assessment & Plan:  1.Functional deficits secondary to right below-knee amputation, history of phantom limb pain.:  -still would benefit from a powered device to use for mobility outside the house   -we can discuss further at future visits 2. Peripheral neuropathy: Continue Gabapentin.   Continue Fentanyl Patch one patch every three days #10 andOxycodone 10/325mg  one tablet every 6 hours as needed #120.  1 RF provided of each today -We will continue the controlled substance monitoring program, this consists of regular clinic visits, examinations, routine drug screening, pill counts as well as use of [place] Controlled Substance Reporting System. [place] today.  .   3. History of rheumatoid arthritis. Continue Current Medication and Exercise and heat Therapy.  4. Osteoarthritis of left knee: Continue with Voltaren gel/ Exercise and heat therapy.  5. Chronic cellulitis, left leg wound with associated edema/buttock wound: -continue [facility] wound care clinic mgt 6. Lumbar Spondylosis: continue HEP.  7. Herpes Zoster outbreak:  -increase gabapentin to 600mg  QID TEMPORARILY to assist with herpetic neuralgia  -also will add lidoderm patches once rash resolves, to be  used for ongoing pain    25 minutes of face to face patient care time was spent during this visit. All questions were encouraged and answered. We will see her in follow up in about 1 month

## 2017-09-23 ENCOUNTER — Other Ambulatory Visit: Payer: Self-pay | Admitting: Family Medicine

## 2017-09-23 ENCOUNTER — Other Ambulatory Visit: Payer: Self-pay | Admitting: Physical Medicine & Rehabilitation

## 2017-10-06 ENCOUNTER — Telehealth: Payer: Self-pay | Admitting: Physical Medicine & Rehabilitation

## 2017-10-06 NOTE — Telephone Encounter (Signed)
Spoke with Crystal Fitzpatrick, according to the PMP Aware web-site she picked up her Fentanyl and Oxycodone on 09/15/2017. She reports she is having a hard time with the Fentanyl patches adhering to her skin, her pharmacy switch to a new manufacturer. She is going to call [facility] to see if they carry her brand, she will call and leave message for provider she states. She was instructed to call by noon on 10/07/2017. She verbalizes understanding.

## 2017-10-06 NOTE — Telephone Encounter (Signed)
Patient has lost 2 patches, due to them sticking on her clothing and not being able to reapply it.  Please call patient.

## 2017-10-07 ENCOUNTER — Telehealth: Payer: Self-pay | Admitting: *Deleted

## 2017-10-07 ENCOUNTER — Telehealth: Payer: Self-pay | Admitting: Registered Nurse

## 2017-10-07 DIAGNOSIS — Z89519 Acquired absence of unspecified leg below knee: Secondary | ICD-10-CM

## 2017-10-07 MED ORDER — FENTANYL 75 MCG/HR TD PT72: 75.0000 ug | MEDICATED_PATCH | TRANSDERMAL | 0 refills | Status: DC

## 2017-10-07 MED ORDER — OXYCODONE-ACETAMINOPHEN 10-325 MG PO TABS: 1.0000 | ORAL_TABLET | Freq: Four times a day (QID) | ORAL | 0 refills | Status: DC | PRN

## 2017-10-07 NOTE — Telephone Encounter (Signed)
Return Ms. Ms. Gonet call she is aware he medications were e-scribe and we spoke about the Tegaderm, she verbalizes understanding.

## 2017-10-07 NOTE — Telephone Encounter (Signed)
Spoke to the Pharmacist at [facility]: Ms. Caputo having difficulty with Fentanyl Patches adhering to her skin, we discuss tegaderm. The cost is 14.29 for 4 patches. Called Ms. Carlo regarding the above no answer. Left message  Regarding the above. Medications were e-scribe. According to the PMP Aware Website last prescriptions were picked up on 09/15/2017.

## 2017-10-07 NOTE — Telephone Encounter (Signed)
Crystal Fitzpatrick called and said that she could not find medication elsewhere, so send her fentanyl and percocet to her regular pharmacy.

## 2017-10-19 ENCOUNTER — Ambulatory Visit: Payer: Medicare Other | Admitting: Registered Nurse

## 2017-11-04 ENCOUNTER — Encounter: Payer: Self-pay | Admitting: Registered Nurse

## 2017-11-04 ENCOUNTER — Encounter: Payer: Medicare Other | Attending: Physical Medicine and Rehabilitation | Admitting: Registered Nurse

## 2017-11-04 VITALS — BP 130/78 | HR 78 | Resp 14 | Ht 70.0 in | Wt 230.0 lb

## 2017-11-04 DIAGNOSIS — Z89519 Acquired absence of unspecified leg below knee: Secondary | ICD-10-CM | POA: Insufficient documentation

## 2017-11-04 DIAGNOSIS — Z79891 Long term (current) use of opiate analgesic: Secondary | ICD-10-CM

## 2017-11-04 DIAGNOSIS — M546 Pain in thoracic spine: Secondary | ICD-10-CM

## 2017-11-04 DIAGNOSIS — M179 Osteoarthritis of knee, unspecified: Secondary | ICD-10-CM | POA: Diagnosis present

## 2017-11-04 DIAGNOSIS — B0229 Other postherpetic nervous system involvement: Secondary | ICD-10-CM

## 2017-11-04 DIAGNOSIS — G546 Phantom limb syndrome with pain: Secondary | ICD-10-CM

## 2017-11-04 DIAGNOSIS — G609 Hereditary and idiopathic neuropathy, unspecified: Secondary | ICD-10-CM | POA: Insufficient documentation

## 2017-11-04 DIAGNOSIS — M1712 Unilateral primary osteoarthritis, left knee: Secondary | ICD-10-CM

## 2017-11-04 DIAGNOSIS — M069 Rheumatoid arthritis, unspecified: Secondary | ICD-10-CM | POA: Diagnosis present

## 2017-11-04 DIAGNOSIS — M48061 Spinal stenosis, lumbar region without neurogenic claudication: Secondary | ICD-10-CM | POA: Diagnosis not present

## 2017-11-04 DIAGNOSIS — G8929 Other chronic pain: Secondary | ICD-10-CM

## 2017-11-04 DIAGNOSIS — M4716 Other spondylosis with myelopathy, lumbar region: Secondary | ICD-10-CM | POA: Diagnosis present

## 2017-11-04 DIAGNOSIS — Z5181 Encounter for therapeutic drug level monitoring: Secondary | ICD-10-CM | POA: Diagnosis not present

## 2017-11-04 DIAGNOSIS — G894 Chronic pain syndrome: Secondary | ICD-10-CM

## 2017-11-04 MED ORDER — OXYCODONE-ACETAMINOPHEN 10-325 MG PO TABS: 1.0000 | ORAL_TABLET | Freq: Four times a day (QID) | ORAL | 0 refills | Status: DC | PRN

## 2017-11-04 MED ORDER — FENTANYL 75 MCG/HR TD PT72: 75.0000 ug | MEDICATED_PATCH | TRANSDERMAL | 0 refills | Status: DC

## 2017-11-04 NOTE — Progress Notes (Signed)
Subjective:    Patient ID: Crystal Fitzpatrick, female    DOB: 1945-12-04, 72 y.o.   MRN: [id number]  HPI: [name] is a 72 year old female who returns for follow up appointment for chronic pain and medication refill. She states her pain is located in her mid-lower back pain. She rates her pain 6. Her current exercise regime is performing stretching exercise daily.   [name] Equivalent is 240.00 MME. Last Oral Swab was performed on 05/26/2017, it was consistent.   [name] receiving wound care at [facility] weekly and  [facility] Nurses changing dressing two times a week.    Pain Inventory Average Pain 6 Pain Right Now 6 My pain is sharp, burning, dull, stabbing, tingling and aching  In the last 24 hours, has pain interfered with the following? General activity 6 Relation with others 4 Enjoyment of life 2 What TIME of day is your pain at its worst? morning, night  Sleep (in general) NA  Pain is worse with: inactivity Pain improves with: heat/ice and medication Relief from Meds: 7  Mobility ability to climb steps?  no do you drive?  no use a wheelchair  Function I need assistance with the following:  household duties  Neuro/Psych trouble walking  Prior Studies Any changes since last visit?  no  Physicians involved in your care Any changes since last visit?  no   Family History  Problem Relation Age of Onset  . Breast cancer Sister   . Diabetes Other   . Stroke Other        Grandparents   Social History   Socioeconomic History  . Marital status: Divorced    Spouse name: Not on file  . Number of children: 1  . Years of education: Not on file  . Highest education level: Not on file  Occupational History  . Occupation: retired Oncologist: RETIRED  Social Needs  . Financial resource strain: Not on file  . Food insecurity:    Worry: Not on file    Inability: Not on file  . Transportation  needs:    Medical: Not on file    Non-medical: Not on file  Tobacco Use  . Smoking status: Former Games developer  . Smokeless tobacco: Never Used  Substance and Sexual Activity  . Alcohol use: No    Alcohol/week: 0.0 oz  . Drug use: No  . Sexual activity: Not on file  Lifestyle  . Physical activity:    Days per week: Not on file    Minutes per session: Not on file  . Stress: Not on file  Relationships  . Social connections:    Talks on phone: Not on file    Gets together: Not on file    Attends religious service: Not on file    Active member of club or organization: Not on file    Attends meetings of clubs or organizations: Not on file    Relationship status: Not on file  Other Topics Concern  . Not on file  Social History Narrative  . Not on file   Past Surgical History:  Procedure Laterality Date  . AMPUTATION  02/02/2008   below right knee  . foot surgury  1980 and 1981  . NASAL SEPTUM SURGERY    . 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 s/p Breast biopsy  1992  . s/p EGD  2007   with  Botox for? Achalasia  . TONSILLECTOMY    . TUBAL LIGATION     Past Medical History:  Diagnosis Date  . ABDOMINAL DISTENSION 10/11/2008  . Acute gouty arthropathy 12/11/2008  . Altered mental status 02/07/2010  . AMPUTATION, BELOW KNEE, RIGHT, HX OF 01/28/2009  . ANAPHYLACTIC SHOCK 05/15/2009  . ANEMIA-NOS 07/17/2008  . ANXIETY 07/17/2008  . ARTHRITIS 01/28/2009  . B12 DEFICIENCY 07/17/2008  . Cellulitis and abscess of leg, except foot 11/15/2008  . CHF 01/28/2009  . Chronic pain syndrome 02/14/2010  . COLONIC POLYPS, HX OF 07/17/2008  . DIABETES MELLITUS, TYPE II 07/17/2008  . Dysuria 11/11/2009  . FOOT PAIN 07/17/2008  . GERD 07/17/2008  . HEARING LOSS, RIGHT EAR 06/04/2009  . HYPERKALEMIA 10/31/2009  . Hyperlipemia 09/24/2010  . Hyperlipidemia 09/24/2010  . HYPERSOMNIA 02/14/2010  . HYPERTENSION 07/17/2008  . HYPONATREMIA 03/20/2010  . Hypoxemia 03/20/2010  . IBS 06/04/2009  . MENOPAUSAL DISORDER  12/03/2009  . NEUROPATHY, HX OF 01/28/2009  . Osteoporosis 11/16/2014  . PERIPHERAL EDEMA 08/30/2008  . Pneumonia, aspiration (HCC) 12/01/2011   Episode July 2013, tx per [facility]  . Pneumonia, organism unspecified(486) 02/14/2010  . RENAL INSUFFICIENCY 10/31/2009  . Rheumatoid arthritis(714.0) 07/17/2008  . Seizure (HCC) 10/26/2010  . SKIN LESION 06/04/2009  . SKIN ULCER, CHRONIC 02/08/2009  . Spinal stenosis of lumbar region 05/15/2015  . Trigeminal neuralgia 02/14/2010  . UNSPECIFIED HEPATITIS 01/28/2009  . Wheezing 12/03/2009   BP 130/78 (BP Location: Left Arm, Patient Position: Sitting, Cuff Size: Large)   Pulse 78   Resp 14   Ht 5\' 10"  (1.778 m)   Wt 230 lb (104.3 kg)   SpO2 97%   BMI 33.00 kg/m   Opioid Risk Score:   Fall Risk Score:  `1  Depression screen PHQ 2/9  Depression screen [facility] 2/9 08/19/2017 06/24/2017 05/26/2017 03/04/2017 10/07/2016 01/27/2016 12/26/2015  Decreased Interest 1 0 1 1 0 0 0  Down, Depressed, Hopeless 1 0 1 1 0 0 0  PHQ - 2 Score 2 0 2 2 0 0 0  Altered sleeping 0 - - - - - -  Tired, decreased energy 1 - - - - - -  Change in appetite 1 - - - - - -  Feeling bad or failure about yourself  1 - - - - - -  Trouble concentrating 0 - - - - - -  Moving slowly or fidgety/restless 0 - - - - - -  Suicidal thoughts 0 - - - - - -  PHQ-9 Score 5 - - - - - -  Difficult doing work/chores - - - - - - -  Some recent data might be hidden    Review of Systems  Constitutional: Negative.   HENT: Negative.   Eyes: Negative.   Respiratory: Negative.   Cardiovascular: Negative.   Gastrointestinal: Negative.   Endocrine: Negative.   Genitourinary: Negative.   Musculoskeletal: Positive for back pain.  Skin: Negative.   Allergic/Immunologic: Negative.   Neurological: Negative.   Psychiatric/Behavioral: Negative.   All other systems reviewed and are negative.      Objective:   Physical Exam  Constitutional: She is oriented to person, place, and time. She  appears well-developed and well-nourished.  HENT:  Head: Normocephalic and atraumatic.  Neck: Normal range of motion. Neck supple.  Cardiovascular: Normal rate and regular rhythm.  Pulmonary/Chest: Effort normal and breath sounds normal.  Musculoskeletal:  Normal Muscle Bulk and Muscle Testing Reveals: Upper Extremities: Full  ROM and Muscle Strength 5/5 Thoracic Paraspinal Tenderness: T-8-T-9 Hypersensitivity Lumbar Paraspinal Tenderness: L-3-L-5 Lower Extremities: Right: BKA  Left: Full  ROM and  Muscle Strength 5/5 Arrived in wheelchair   Neurological: She is alert and oriented to person, place, and time.  Skin: Skin is warm and dry.  Thoracic Vesicular Rash Healing  Psychiatric: She has a normal mood and affect.  Nursing note and vitals reviewed.         Assessment & Plan:  1.Right below-knee amputation, history of phantom limb pain.: Continue with  Current Medication Regime and Continue to Monitor. 06/20//2019 2. Peripheral neuropathy: Continue with current treatment regimen:  Gabapentin, Topamax and Cymbalta. 11/04/2017 3. Chronic Pain Syndrome: Continue Current Treatment Regimen:  Refilled: Fenatnyl Patch 75 mcg one patch every three days #10 and Continue Oxycodone 10/325mg  one tablet every 6 hours as needed #120. 11/04/2017 We will continue the opioid monitoring program, this consists of regular clinic visits, examinations, urine drug screen, pill counts as well as use of [place] Controlled Substance Reporting System. 4. History of rheumatoid arthritis. Continue Current Medication and Exercise and heat Therapy. 06/20//2019 5. Osteoarthritis of left knee: No complaints today.Continue current treatment regimen with:  Voltaren gel/ Exercise and heat therapy. 11/04/2017 6. Chronic cellulitis, left legulcerwith associated edema:Wound Care Following: Attending [facility] weekly and [facility] changing dressing twice a week. 11/04/2017 7. Lumbar  Spondylosis: Continue current medication regime. Continue HEP as tolerated. Continue to Monitor. [name]06/20/2019 8. Chronic Right Shoulder Pain:No complaints Today. Continue HEP as Tolerated. 06/20//2019 9. Muscle Spasm: Continue current treatment regimen with Flexeril. 11/04/2017 10. 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 11/04/2017 11. Post Herpetic Neuralgia: Continue Gabapentin and Lidocaine Patches. 11/04/2017.  30 minutes of face to face patient care time was spent during this visit. All questions were encouraged and answered.  F/U in 1 month

## 2017-11-09 LAB — TOXASSURE SELECT,+ANTIDEPR,UR

## 2017-11-12 ENCOUNTER — Telehealth: Payer: Self-pay | Admitting: *Deleted

## 2017-11-12 NOTE — Telephone Encounter (Signed)
Urine drug screen for this encounter is consistent for prescribed medication 

## 2017-12-02 ENCOUNTER — Other Ambulatory Visit: Payer: Self-pay | Admitting: Physical Medicine & Rehabilitation

## 2017-12-08 ENCOUNTER — Other Ambulatory Visit: Payer: Self-pay

## 2017-12-08 ENCOUNTER — Encounter: Payer: Medicare Other | Attending: Physical Medicine and Rehabilitation | Admitting: Registered Nurse

## 2017-12-08 VITALS — BP 144/80 | HR 75 | Wt 220.0 lb

## 2017-12-08 DIAGNOSIS — M069 Rheumatoid arthritis, unspecified: Secondary | ICD-10-CM | POA: Insufficient documentation

## 2017-12-08 DIAGNOSIS — M48061 Spinal stenosis, lumbar region without neurogenic claudication: Secondary | ICD-10-CM | POA: Diagnosis not present

## 2017-12-08 DIAGNOSIS — M179 Osteoarthritis of knee, unspecified: Secondary | ICD-10-CM | POA: Insufficient documentation

## 2017-12-08 DIAGNOSIS — Z79891 Long term (current) use of opiate analgesic: Secondary | ICD-10-CM

## 2017-12-08 DIAGNOSIS — M4716 Other spondylosis with myelopathy, lumbar region: Secondary | ICD-10-CM | POA: Diagnosis present

## 2017-12-08 DIAGNOSIS — M47816 Spondylosis without myelopathy or radiculopathy, lumbar region: Secondary | ICD-10-CM | POA: Diagnosis not present

## 2017-12-08 DIAGNOSIS — G546 Phantom limb syndrome with pain: Secondary | ICD-10-CM

## 2017-12-08 DIAGNOSIS — Z89519 Acquired absence of unspecified leg below knee: Secondary | ICD-10-CM | POA: Diagnosis present

## 2017-12-08 DIAGNOSIS — G609 Hereditary and idiopathic neuropathy, unspecified: Secondary | ICD-10-CM | POA: Diagnosis present

## 2017-12-08 DIAGNOSIS — M1712 Unilateral primary osteoarthritis, left knee: Secondary | ICD-10-CM

## 2017-12-08 DIAGNOSIS — G894 Chronic pain syndrome: Secondary | ICD-10-CM

## 2017-12-08 DIAGNOSIS — Z5181 Encounter for therapeutic drug level monitoring: Secondary | ICD-10-CM

## 2017-12-08 MED ORDER — OXYCODONE-ACETAMINOPHEN 10-325 MG PO TABS: 1.0000 | ORAL_TABLET | Freq: Four times a day (QID) | ORAL | 0 refills | Status: DC | PRN

## 2017-12-08 MED ORDER — FENTANYL 75 MCG/HR TD PT72: 75.0000 ug | MEDICATED_PATCH | TRANSDERMAL | 0 refills | Status: DC

## 2017-12-08 NOTE — Progress Notes (Signed)
Subjective:    Patient ID: Crystal Fitzpatrick, female    DOB: [birth year], 72 y.o.   MRN: [id number]  HPI: Crystal Fitzpatrick is a 72 year old female who returns for follow up appointment for chronic pain and medication refill. She states her pain is located in her right shoulder, mid-lower back radiating into her right lower extremity and buttock pain.  Crystal Fitzpatrick receiving  wound Care at [facility] and family friend changing her dressing twice a week.   Crystal Fitzpatrick requesting a new wheelchair cushion replacement, this provider called [facility]. Cushion : Amputee Support Pad 14 inches comfort. Medicare will replace in 02/06/2018. The out of pocket expense will be 185.00 dollars, call placed to Crystal Fitzpatrick she verbalizes understanding. She will purchase the cushion in a few weeks she reports.   Crystal Fitzpatrick Morphine Equivalent is 240.00 MME. Last UDS was Performed on 11/04/2017, it was consistent.   Pain Inventory Average Pain 6 Pain Right Now 7 My pain is sharp and stabbing  In the last 24 hours, has pain interfered with the following? General activity 7 Relation with others 8 Enjoyment of life 7 What TIME of day is your pain at its worst? morning evening night Sleep (in general) Poor  Pain is worse with: bending, sitting, inactivity and some activites Pain improves with: rest and medication Relief from Meds: 5  Mobility Do you have any goals in this area?  no  Function Do you have any goals in this area?  no  Neuro/Psych No problems in this area  Prior Studies Any changes since last visit?  no  Physicians involved in your care Any changes since last visit?  no   Family History  Problem Relation Age of Onset  . Breast cancer Sister   . Diabetes Other   . Stroke Other        Grandparents   Social History   Socioeconomic History  . Marital status: Divorced    Spouse name: Not on file  . Number of children: 1  . Years of education: Not on file  .  Highest education level: Not on file  Occupational History  . Occupation: retired Oncologist: RETIRED  Social Needs  . Financial resource strain: Not on file  . Food insecurity:    Worry: Not on file    Inability: Not on file  . Transportation needs:    Medical: Not on file    Non-medical: Not on file  Tobacco Use  . Smoking status: Former Games developer  . Smokeless tobacco: Never Used  Substance and Sexual Activity  . Alcohol use: No    Alcohol/week: 0.0 oz  . Drug use: No  . Sexual activity: Not on file  Lifestyle  . Physical activity:    Days per week: Not on file    Minutes per session: Not on file  . Stress: Not on file  Relationships  . Social connections:    Talks on phone: Not on file    Gets together: Not on file    Attends religious service: Not on file    Active member of club or organization: Not on file    Attends meetings of clubs or organizations: Not on file    Relationship status: Not on file  Other Topics Concern  . Not on file  Social History Narrative  . Not on file   Past Surgical History:  Procedure Laterality Date  . AMPUTATION  02/02/2008   below right knee  . foot surgury  1980 and 1981  . NASAL SEPTUM SURGERY    . 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 s/p Breast biopsy  1992  . s/p EGD  2007   with Botox for? Achalasia  . TONSILLECTOMY    . TUBAL LIGATION     Past Medical History:  Diagnosis Date  . ABDOMINAL DISTENSION 10/11/2008  . Acute gouty arthropathy 12/11/2008  . Altered mental status 02/07/2010  . AMPUTATION, BELOW KNEE, RIGHT, HX OF 01/28/2009  . ANAPHYLACTIC SHOCK 05/15/2009  . ANEMIA-NOS 07/17/2008  . ANXIETY 07/17/2008  . ARTHRITIS 01/28/2009  . B12 DEFICIENCY 07/17/2008  . Cellulitis and abscess of leg, except foot 11/15/2008  . CHF 01/28/2009  . Chronic pain syndrome 02/14/2010  . COLONIC POLYPS, HX OF 07/17/2008  . DIABETES MELLITUS, TYPE II 07/17/2008  . Dysuria 11/11/2009  . FOOT PAIN 07/17/2008    . GERD 07/17/2008  . HEARING LOSS, RIGHT EAR 06/04/2009  . HYPERKALEMIA 10/31/2009  . Hyperlipemia 09/24/2010  . Hyperlipidemia 09/24/2010  . HYPERSOMNIA 02/14/2010  . HYPERTENSION 07/17/2008  . HYPONATREMIA 03/20/2010  . Hypoxemia 03/20/2010  . IBS 06/04/2009  . MENOPAUSAL DISORDER 12/03/2009  . NEUROPATHY, HX OF 01/28/2009  . Osteoporosis 11/16/2014  . PERIPHERAL EDEMA 08/30/2008  . Pneumonia, aspiration (HCC) 12/01/2011   Episode July 2013, tx per [facility]  . Pneumonia, organism unspecified(486) 02/14/2010  . RENAL INSUFFICIENCY 10/31/2009  . Rheumatoid arthritis(714.0) 07/17/2008  . Seizure (HCC) 10/26/2010  . SKIN LESION 06/04/2009  . SKIN ULCER, CHRONIC 02/08/2009  . Spinal stenosis of lumbar region 05/15/2015  . Trigeminal neuralgia 02/14/2010  . UNSPECIFIED HEPATITIS 01/28/2009  . Wheezing 12/03/2009   BP (!) 144/80   Pulse 75   Wt 220 lb (99.8 kg) Comment: pt reported, in wheelchair, ampute  SpO2 95%   BMI 31.57 kg/m   Opioid Risk Score:   Fall Risk Score:  `1  Depression screen PHQ 2/9  Depression screen [facility] 2/9 12/08/2017 08/19/2017 06/24/2017 05/26/2017 03/04/2017 10/07/2016 01/27/2016  Decreased Interest 1 1 0 1 1 0 0  Down, Depressed, Hopeless 1 1 0 1 1 0 0  PHQ - 2 Score 2 2 0 2 2 0 0  Altered sleeping - 0 - - - - -  Tired, decreased energy - 1 - - - - -  Change in appetite - 1 - - - - -  Feeling bad or failure about yourself  - 1 - - - - -  Trouble concentrating - 0 - - - - -  Moving slowly or fidgety/restless - 0 - - - - -  Suicidal thoughts - 0 - - - - -  PHQ-9 Score - 5 - - - - -  Difficult doing work/chores - - - - - - -  Some recent data might be hidden    Review of Systems  Constitutional: Negative.   HENT: Negative.   Eyes: Negative.   Respiratory: Negative.   Cardiovascular: Negative.   Gastrointestinal: Negative.   Endocrine: Negative.   Genitourinary: Positive for dysuria.  Musculoskeletal: Negative.   Skin: Positive for rash.   Allergic/Immunologic: Negative.   Neurological: Negative.   Hematological: Negative.   Psychiatric/Behavioral: Negative.   All other systems reviewed and are negative.      Objective:   Physical Exam  Constitutional: She is oriented to person, place, and time. She appears well-developed and well-nourished.  HENT:  Head:  Normocephalic and atraumatic.  Neck: Normal range of motion. Neck supple.  Cardiovascular: Normal rate and regular rhythm.  Pulmonary/Chest: Effort normal and breath sounds normal.  Musculoskeletal:  Normal Muscle Bulk and Muscle Testing Reveals: Upper Extremities: Full ROM and Muscle Strength 5/5 Right AC Joint Tenderness Thoracic Paraspinal Tenderness: T-1-T-3 Lumbar Paraspinal Tenderness: L-3-L-5 Lower Extremities: Right BKA Left: Decreased ROM and Muscle Strength 4/5 [place] Wrap Dressing Intact Arrived in wheelchair  Neurological: She is alert and oriented to person, place, and time.  Skin: Skin is warm and dry.  Psychiatric: She has a normal mood and affect. Her behavior is normal.  Nursing note and vitals reviewed.         Assessment & Plan:  1.Right below-knee amputation, history of phantom limb pain.: ContinuewithCurrent Medication Regime and Continue to Monitor. 07/24//2019 2. Peripheral neuropathy: Continuewith current treatment regimen:Gabapentin, Topamaxand Cymbalta. 12/08/2017 3. Chronic Pain Syndrome:Continue Current Treatment Regimen: Refilled: Fenatnyl Patch 75 mcg one patch every three days #10 and Continue Oxycodone 10/325mg  one tablet every 6 hours as needed #120. 12/08/2017 We will continue the opioid monitoring program, this consists of regular clinic visits, examinations, urine drug screen, pill counts as well as use of [place] Controlled Substance Reporting System. 4. History of rheumatoid arthritis. Continue Current Medication and Exercise and heat Therapy. 07/24//2019 5. Osteoarthritis of left knee: No complaints  today.Continuecurrent treatment regimen with:Voltaren gel/ Exercise and heat therapy. 12/08/2017 6. Chronic cellulitis, left legulcerwith associated edema:Wound Care Following:Attending [facility] weekly and Family Friend  changing dressing twice a week. 12/08/2017 7. Lumbar Spondylosis: Continue current medication regime. Continue HEPas tolerated. Continue to Monitor.12/08/2017 8. Chronic Right Shoulder Pain:Continue HEP as Tolerated. 07/24//2019 9. Muscle Spasm:Continue current treatment regimen withFlexeril. 12/08/2017 10. 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 12/08/2017 11. Post Herpetic Neuralgia: Continue Gabapentin and Lidocaine Patches. 12/08/2017.  30 minutes of face to face patient care time was spent during this visit. All questions were encouraged and answered.  F/U in 1 month

## 2017-12-09 ENCOUNTER — Encounter: Payer: Self-pay | Admitting: Registered Nurse

## 2017-12-13 ENCOUNTER — Ambulatory Visit: Payer: Medicare Other | Admitting: Physical Medicine & Rehabilitation

## 2017-12-13 ENCOUNTER — Telehealth: Payer: Self-pay | Admitting: Registered Nurse

## 2017-12-13 ENCOUNTER — Telehealth: Payer: Self-pay | Admitting: Physical Medicine & Rehabilitation

## 2017-12-13 MED ORDER — FENTANYL 50 MCG/HR TD PT72: 50.0000 ug | MEDICATED_PATCH | TRANSDERMAL | 0 refills | Status: DC

## 2017-12-13 NOTE — Telephone Encounter (Signed)
Spoke with patient. To clarify, patient's medication was apparently thrown away in the trash receptacle at home.  It has since been picked up and hauled away to waste dump site.  She has informed her pharmacy. She is prepared to pay the cash price for the medication.  Please advise and contact patient

## 2017-12-13 NOTE — Telephone Encounter (Signed)
Pt picked up patches on wed 7/24, got lunch & was exhausted when she arrived home. She laid down for nap, when she got up from her nap she realized that she had thrown the patches away with her lunch trash...  She's desperate to get another rx for patches!! Please call to advise...  Thanks.Marland KitchenMarland Kitchen

## 2017-12-13 NOTE — Telephone Encounter (Addendum)
Placed a call to [name], no answer, awaiting a return call.

## 2017-12-13 NOTE — Telephone Encounter (Signed)
Spoke with Crystal Fitzpatrick regarding her Fentanyl patches she accidentally thrown in the tras. Also reports last Tuesday (12/07/2017) was the day she placed her last patch on. PMP Aware Web-site was checked, she picked up her patches on 12/08/2017.  Also reports she has checked in her home and doesn't have the patches.  This provider placed a call to [facility], the pharmacist states the Fentanyl 75 mcg will cost her $261.00, asked about the 50 mcg this will cost her $160.00. I will speak with Dr. Riley Kill and call Crystal Fitzpatrick after speaking with Dr. Riley Kill, she verbalizes understanding.

## 2017-12-13 NOTE — Telephone Encounter (Signed)
Attempted to contact patient. Left a message to call back to discuss options.

## 2017-12-13 NOTE — Telephone Encounter (Signed)
This provider spoke with Dr. Riley Kill regarding Crystal Fitzpatrick accidental disposable of Fentanyl patches. Crystal Fitzpatrick MME was 240.00 MME with the Fentanyl 75 mcg and Oxycodone. Will decrease her Fentanyl dose to 50 mcg, Dr. Riley Kill agrees with plan. The above discussed with Crystal Fitzpatrick she verbalizes understanding. Crystal Fitzpatrick re-iterates she doesn't have the 75 mcg of Fentanyl she looked everywhere for them she reports.

## 2017-12-17 NOTE — Telephone Encounter (Signed)
Handled by Riley Lam in separate phone msg.

## 2018-01-05 ENCOUNTER — Other Ambulatory Visit: Payer: Self-pay

## 2018-01-05 ENCOUNTER — Encounter: Payer: Medicare Other | Attending: Physical Medicine and Rehabilitation | Admitting: Physical Medicine & Rehabilitation

## 2018-01-05 ENCOUNTER — Encounter: Payer: Self-pay | Admitting: Physical Medicine & Rehabilitation

## 2018-01-05 VITALS — BP 119/74 | HR 78 | Ht 70.0 in | Wt 220.0 lb

## 2018-01-05 DIAGNOSIS — M069 Rheumatoid arthritis, unspecified: Secondary | ICD-10-CM | POA: Insufficient documentation

## 2018-01-05 DIAGNOSIS — Z89519 Acquired absence of unspecified leg below knee: Secondary | ICD-10-CM | POA: Insufficient documentation

## 2018-01-05 DIAGNOSIS — G609 Hereditary and idiopathic neuropathy, unspecified: Secondary | ICD-10-CM | POA: Diagnosis present

## 2018-01-05 DIAGNOSIS — M179 Osteoarthritis of knee, unspecified: Secondary | ICD-10-CM | POA: Insufficient documentation

## 2018-01-05 DIAGNOSIS — M4716 Other spondylosis with myelopathy, lumbar region: Secondary | ICD-10-CM | POA: Diagnosis present

## 2018-01-05 DIAGNOSIS — G894 Chronic pain syndrome: Secondary | ICD-10-CM

## 2018-01-05 DIAGNOSIS — M47816 Spondylosis without myelopathy or radiculopathy, lumbar region: Secondary | ICD-10-CM

## 2018-01-05 DIAGNOSIS — G546 Phantom limb syndrome with pain: Secondary | ICD-10-CM

## 2018-01-05 MED ORDER — OXYCODONE-ACETAMINOPHEN 10-325 MG PO TABS: 1.0000 | ORAL_TABLET | Freq: Four times a day (QID) | ORAL | 0 refills | Status: DC | PRN

## 2018-01-05 MED ORDER — FENTANYL 50 MCG/HR TD PT72: 50.0000 ug | MEDICATED_PATCH | TRANSDERMAL | 0 refills | Status: DC

## 2018-01-05 NOTE — Progress Notes (Signed)
Subjective:    Patient ID: Crystal Fitzpatrick, female    DOB: February 20, 1946, 72 y.o.   MRN: [id number]  HPI   Crystal Fitzpatrick is here in follow-up of her chronic pain.  Recently at home, she misplaced her fentanyl patch.  After discussion with the patient and my nurse practitioner we decided to decrease her fentanyl to 50 mcg an hour.  She remains on her Percocet 10/325 1 every 6 hours as needed for breakthrough pain. She states that her pain is increased but her pain scores are only minimally increased from earlier in the year.   She continues follow-up at the wound care center in [facility] for her ongoing lower extremity wounds. She is down to "two toes" as far as the left foot is concerned.   She saw a man with a knee scooter and she's interested for one of these at home to help with mobility within the house.  Pain Inventory Average Pain 6 Pain Right Now 7 My pain is intermittent, constant, sharp, burning, dull, stabbing, tingling and aching  In the last 24 hours, has pain interfered with the following? General activity 7 Relation with others 6 Enjoyment of life 7 What TIME of day is your pain at its worst? morning Sleep (in general) Fair  Pain is worse with: sitting Pain improves with: medication Relief from Meds: 8  Mobility use a wheelchair needs help with transfers  Function disabled: date disabled n/a  Neuro/Psych weakness tingling  Prior Studies Any changes since last visit?  no  Physicians involved in your care Any changes since last visit?  no   Family History  Problem Relation Age of Onset  . Breast cancer Sister   . Diabetes Other   . Stroke Other        Grandparents   Social History   Socioeconomic History  . Marital status: Divorced    Spouse name: Not on file  . Number of children: 1  . Years of education: Not on file  . Highest education level: Not on file  Occupational History  . Occupation: retired Oncologist:  RETIRED  Social Needs  . Financial resource strain: Not on file  . Food insecurity:    Worry: Not on file    Inability: Not on file  . Transportation needs:    Medical: Not on file    Non-medical: Not on file  Tobacco Use  . Smoking status: Former Games developer  . Smokeless tobacco: Never Used  Substance and Sexual Activity  . Alcohol use: No    Alcohol/week: 0.0 standard drinks  . Drug use: No  . Sexual activity: Not on file  Lifestyle  . Physical activity:    Days per week: Not on file    Minutes per session: Not on file  . Stress: Not on file  Relationships  . Social connections:    Talks on phone: Not on file    Gets together: Not on file    Attends religious service: Not on file    Active member of club or organization: Not on file    Attends meetings of clubs or organizations: Not on file    Relationship status: Not on file  Other Topics Concern  . Not on file  Social History Narrative  . Not on file   Past Surgical History:  Procedure Laterality Date  . AMPUTATION  02/02/2008   below right knee  . foot surgury  1980 and 1981  .  NASAL SEPTUM SURGERY    . 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 s/p Breast biopsy  1992  . s/p EGD  2007   with Botox for? Achalasia  . TONSILLECTOMY    . TUBAL LIGATION     Past Medical History:  Diagnosis Date  . ABDOMINAL DISTENSION 10/11/2008  . Acute gouty arthropathy 12/11/2008  . Altered mental status 02/07/2010  . AMPUTATION, BELOW KNEE, RIGHT, HX OF 01/28/2009  . ANAPHYLACTIC SHOCK 05/15/2009  . ANEMIA-NOS 07/17/2008  . ANXIETY 07/17/2008  . ARTHRITIS 01/28/2009  . B12 DEFICIENCY 07/17/2008  . Cellulitis and abscess of leg, except foot 11/15/2008  . CHF 01/28/2009  . Chronic pain syndrome 02/14/2010  . COLONIC POLYPS, HX OF 07/17/2008  . DIABETES MELLITUS, TYPE II 07/17/2008  . Dysuria 11/11/2009  . FOOT PAIN 07/17/2008  . GERD 07/17/2008  . HEARING LOSS, RIGHT EAR 06/04/2009  . HYPERKALEMIA 10/31/2009  . Hyperlipemia 09/24/2010  .  Hyperlipidemia 09/24/2010  . HYPERSOMNIA 02/14/2010  . HYPERTENSION 07/17/2008  . HYPONATREMIA 03/20/2010  . Hypoxemia 03/20/2010  . IBS 06/04/2009  . MENOPAUSAL DISORDER 12/03/2009  . NEUROPATHY, HX OF 01/28/2009  . Osteoporosis 11/16/2014  . PERIPHERAL EDEMA 08/30/2008  . Pneumonia, aspiration (HCC) 12/01/2011   Episode July 2013, tx per [facility]  . Pneumonia, organism unspecified(486) 02/14/2010  . RENAL INSUFFICIENCY 10/31/2009  . Rheumatoid arthritis(714.0) 07/17/2008  . Seizure (HCC) 10/26/2010  . SKIN LESION 06/04/2009  . SKIN ULCER, CHRONIC 02/08/2009  . Spinal stenosis of lumbar region 05/15/2015  . Trigeminal neuralgia 02/14/2010  . UNSPECIFIED HEPATITIS 01/28/2009  . Wheezing 12/03/2009   BP 119/74   Pulse 78   Ht 5\' 10"  (1.778 m) Comment: pt reported, in wheelchair  Wt 220 lb (99.8 kg) Comment: pt reported, in wheelchair  SpO2 92%   BMI 31.57 kg/m    Opioid Risk Score:   Fall Risk Score:  `1  Depression screen PHQ 2/9  Depression screen [facility] 2/9 01/05/2018 12/08/2017 08/19/2017 06/24/2017 05/26/2017 03/04/2017 10/07/2016  Decreased Interest 1 1 1  0 1 1 0  Down, Depressed, Hopeless 1 1 1  0 1 1 0  PHQ - 2 Score 2 2 2  0 2 2 0  Altered sleeping - - 0 - - - -  Tired, decreased energy - - 1 - - - -  Change in appetite - - 1 - - - -  Feeling bad or failure about yourself  - - 1 - - - -  Trouble concentrating - - 0 - - - -  Moving slowly or fidgety/restless - - 0 - - - -  Suicidal thoughts - - 0 - - - -  PHQ-9 Score - - 5 - - - -  Difficult doing work/chores - - - - - - -  Some recent data might be hidden    Review of Systems  Constitutional: Negative.   HENT: Negative.   Eyes: Negative.   Respiratory: Negative.   Cardiovascular: Negative.   Gastrointestinal: Negative.   Endocrine: Negative.   Genitourinary: Negative.   Musculoskeletal: Negative.   Skin: Negative.   Allergic/Immunologic: Negative.   Neurological: Negative.   Hematological: Negative.     Psychiatric/Behavioral: Negative.   All other systems reviewed and are negative.      Objective:   Physical Exam  General: No acute distress, obese HEENT: EOMI, oral membranes moist Cards: reg rate  Chest: normal effort Abdomen: Soft, NT, ND Skin: left foot in ACE,  bandaged. Extremities: no edema    Neurological: A sensory deficitto light touch left foot/leg, right leg to PP and LT. UE's: 4/5 deltoids, biceps,triceps 4/5. LLE: 4/5 HF, 3+KE 2/5 ADF/PF. Cognitively she's intact Skin: unna dressing LLE in place. RLE BK intact Psychiatric: Normal mood.     Assessment & Plan:  1.Functional deficits secondary to right below-knee amputation, history of phantom limb pain.:  -consider powered chair or scooter at some point  -she wants to try a knee walker/scooter at home, rx written  -wrote order for PT to assess and instruct in use 2. Peripheral neuropathy: Continue Gabapentin.   Continue Fentanyl Patch: continue at dose for now. #10. Continue percocet 10/325 q6 prn #120.  - Spent extensive time discussing rationale for decreasing medication. Would like to decrease further in the long run.  -We will continue the controlled substance monitoring program, this consists of regular clinic visits, examinations, routine drug screening, pill counts as well as use of [place] Controlled Substance Reporting System. [place] today.   3. History of rheumatoid arthritis. Continue Current Medication and Exercise and heat Therapy.  4. Osteoarthritis of left knee: Continue with Voltaren gel/ Exercise and heat therapy.  5. Chronic cellulitis. left legwoundwith associated edema/buttock wound: -continue [facility] wound care clinic mgt    -slow healing  6. Lumbar Spondylosis: continue HEP.  7. Herpes Zoster outbreak:            -maintain gabapentin  600mg  QID for herpetic neuralgia            - lidoderm patches   of face to face patient  care time was spent during this visit. All questions were encouraged and answered.  NP to see her in follow up in about 1 month

## 2018-01-05 NOTE — Patient Instructions (Signed)
PLEASE FEEL FREE TO CALL OUR OFFICE WITH ANY PROBLEMS OR QUESTIONS ([phone])      

## 2018-01-10 ENCOUNTER — Telehealth: Payer: Self-pay | Admitting: *Deleted

## 2018-01-10 DIAGNOSIS — G894 Chronic pain syndrome: Secondary | ICD-10-CM

## 2018-01-10 DIAGNOSIS — M47816 Spondylosis without myelopathy or radiculopathy, lumbar region: Secondary | ICD-10-CM

## 2018-01-10 DIAGNOSIS — Z89519 Acquired absence of unspecified leg below knee: Secondary | ICD-10-CM

## 2018-01-10 NOTE — Telephone Encounter (Signed)
Crystol called and she failed to answer a call on Friday from [facility] that was time restricted- they have 48 hours to "execute order placed by [name]" and since time lapsed they will need a new order.  She did not say what this order was for.  There was an order placed 01/05/18 by [name] for [facility].  I will reorder.

## 2018-01-20 ENCOUNTER — Telehealth: Payer: Self-pay

## 2018-01-20 NOTE — Telephone Encounter (Signed)
Ryan, PT/[facility] called stating that he went out to assess pt for knee scooter and he states there is no safe way that pt will be able to transfer and stand sturdy on her leg.

## 2018-01-21 NOTE — Telephone Encounter (Signed)
noted 

## 2018-01-21 NOTE — Telephone Encounter (Signed)
Thank you Ryan. I'm glad he was able to validate what I told Mrs Dunigan in the office.

## 2018-02-09 ENCOUNTER — Encounter: Payer: Medicare Other | Admitting: Physical Medicine & Rehabilitation

## 2018-02-10 ENCOUNTER — Encounter: Payer: Medicare Other | Admitting: Registered Nurse

## 2018-02-14 ENCOUNTER — Encounter: Payer: Self-pay | Admitting: Registered Nurse

## 2018-02-14 ENCOUNTER — Encounter: Payer: Medicare Other | Attending: Physical Medicine and Rehabilitation | Admitting: Registered Nurse

## 2018-02-14 VITALS — BP 126/74 | HR 71 | Ht 70.0 in | Wt 220.0 lb

## 2018-02-14 DIAGNOSIS — M069 Rheumatoid arthritis, unspecified: Secondary | ICD-10-CM | POA: Insufficient documentation

## 2018-02-14 DIAGNOSIS — M546 Pain in thoracic spine: Secondary | ICD-10-CM

## 2018-02-14 DIAGNOSIS — G546 Phantom limb syndrome with pain: Secondary | ICD-10-CM

## 2018-02-14 DIAGNOSIS — G609 Hereditary and idiopathic neuropathy, unspecified: Secondary | ICD-10-CM | POA: Insufficient documentation

## 2018-02-14 DIAGNOSIS — M179 Osteoarthritis of knee, unspecified: Secondary | ICD-10-CM | POA: Insufficient documentation

## 2018-02-14 DIAGNOSIS — M1712 Unilateral primary osteoarthritis, left knee: Secondary | ICD-10-CM

## 2018-02-14 DIAGNOSIS — G8929 Other chronic pain: Secondary | ICD-10-CM

## 2018-02-14 DIAGNOSIS — Z5181 Encounter for therapeutic drug level monitoring: Secondary | ICD-10-CM

## 2018-02-14 DIAGNOSIS — Z79891 Long term (current) use of opiate analgesic: Secondary | ICD-10-CM

## 2018-02-14 DIAGNOSIS — Z89519 Acquired absence of unspecified leg below knee: Secondary | ICD-10-CM | POA: Diagnosis present

## 2018-02-14 DIAGNOSIS — M4716 Other spondylosis with myelopathy, lumbar region: Secondary | ICD-10-CM | POA: Insufficient documentation

## 2018-02-14 DIAGNOSIS — M25511 Pain in right shoulder: Secondary | ICD-10-CM

## 2018-02-14 DIAGNOSIS — G894 Chronic pain syndrome: Secondary | ICD-10-CM | POA: Diagnosis not present

## 2018-02-14 DIAGNOSIS — M47816 Spondylosis without myelopathy or radiculopathy, lumbar region: Secondary | ICD-10-CM

## 2018-02-14 MED ORDER — TOPIRAMATE 100 MG PO TABS: ORAL_TABLET | ORAL | 3 refills | Status: DC

## 2018-02-14 MED ORDER — OXYCODONE-ACETAMINOPHEN 10-325 MG PO TABS: 1.0000 | ORAL_TABLET | Freq: Four times a day (QID) | ORAL | 0 refills | Status: DC | PRN

## 2018-02-14 MED ORDER — FENTANYL 50 MCG/HR TD PT72: 50.0000 ug | MEDICATED_PATCH | TRANSDERMAL | 0 refills | Status: DC

## 2018-02-14 NOTE — Progress Notes (Signed)
Subjective:    Patient ID: Crystal Fitzpatrick, female    DOB: April 05, 1946, 72 y.o.   MRN: [id number]  HPI: [name] is a 72 year old female who returns for follow up appointment for chronic pain and medication refill. She states her pain is located in her right shoulder, lower back and left knee. She rates her pain 7. Her current exercise regime is performing stretching exercises.   Ms. Rackers Morphine Equivalent is 180.00 MME. Last UDS was Performed on 11/04/2017, it was consistent.  Pain Inventory Average Pain 8 Pain Right Now 7 My pain is sharp, burning, stabbing and aching  In the last 24 hours, has pain interfered with the following? General activity 5 Relation with others 5 Enjoyment of life 3 What TIME of day is your pain at its worst? night Sleep (in general) Fair  Pain is worse with: bending, sitting, inactivity and some activites Pain improves with: medication Relief from Meds: na\  Mobility use a wheelchair needs help with transfers  Function Do you have any goals in this area?  no  Neuro/Psych No problems in this area  Prior Studies Any changes since last visit?  no  Physicians involved in your care Any changes since last visit?  no   Family History  Problem Relation Age of Onset  . Breast cancer Sister   . Diabetes Other   . Stroke Other        Grandparents   Social History   Socioeconomic History  . Marital status: Divorced    Spouse name: Not on file  . Number of children: 1  . Years of education: Not on file  . Highest education level: Not on file  Occupational History  . Occupation: retired Oncologist: RETIRED  Social Needs  . Financial resource strain: Not on file  . Food insecurity:    Worry: Not on file    Inability: Not on file  . Transportation needs:    Medical: Not on file    Non-medical: Not on file  Tobacco Use  . Smoking status: Former Games developer  . Smokeless tobacco: Never Used  Substance  and Sexual Activity  . Alcohol use: No    Alcohol/week: 0.0 standard drinks  . Drug use: No  . Sexual activity: Not on file  Lifestyle  . Physical activity:    Days per week: Not on file    Minutes per session: Not on file  . Stress: Not on file  Relationships  . Social connections:    Talks on phone: Not on file    Gets together: Not on file    Attends religious service: Not on file    Active member of club or organization: Not on file    Attends meetings of clubs or organizations: Not on file    Relationship status: Not on file  Other Topics Concern  . Not on file  Social History Narrative  . Not on file   Past Surgical History:  Procedure Laterality Date  . AMPUTATION  02/02/2008   below right knee  . foot surgury  1980 and 1981  . NASAL SEPTUM SURGERY    . 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 s/p Breast biopsy  1992  . s/p EGD  2007   with Botox for? Achalasia  . TONSILLECTOMY    . TUBAL LIGATION     Past Medical History:  Diagnosis Date  . ABDOMINAL DISTENSION 10/11/2008  .  Acute gouty arthropathy 12/11/2008  . Altered mental status 02/07/2010  . AMPUTATION, BELOW KNEE, RIGHT, HX OF 01/28/2009  . ANAPHYLACTIC SHOCK 05/15/2009  . ANEMIA-NOS 07/17/2008  . ANXIETY 07/17/2008  . ARTHRITIS 01/28/2009  . B12 DEFICIENCY 07/17/2008  . Cellulitis and abscess of leg, except foot 11/15/2008  . CHF 01/28/2009  . Chronic pain syndrome 02/14/2010  . COLONIC POLYPS, HX OF 07/17/2008  . DIABETES MELLITUS, TYPE II 07/17/2008  . Dysuria 11/11/2009  . FOOT PAIN 07/17/2008  . GERD 07/17/2008  . HEARING LOSS, RIGHT EAR 06/04/2009  . HYPERKALEMIA 10/31/2009  . Hyperlipemia 09/24/2010  . Hyperlipidemia 09/24/2010  . HYPERSOMNIA 02/14/2010  . HYPERTENSION 07/17/2008  . HYPONATREMIA 03/20/2010  . Hypoxemia 03/20/2010  . IBS 06/04/2009  . MENOPAUSAL DISORDER 12/03/2009  . NEUROPATHY, HX OF 01/28/2009  . Osteoporosis 11/16/2014  . PERIPHERAL EDEMA 08/30/2008  . Pneumonia, aspiration (HCC) 12/01/2011    Episode July 2013, tx per [facility]  . Pneumonia, organism unspecified(486) 02/14/2010  . RENAL INSUFFICIENCY 10/31/2009  . Rheumatoid arthritis(714.0) 07/17/2008  . Seizure (HCC) 10/26/2010  . SKIN LESION 06/04/2009  . SKIN ULCER, CHRONIC 02/08/2009  . Spinal stenosis of lumbar region 05/15/2015  . Trigeminal neuralgia 02/14/2010  . UNSPECIFIED HEPATITIS 01/28/2009  . Wheezing 12/03/2009   There were no vitals taken for this visit.  Opioid Risk Score:   Fall Risk Score:  `1  Depression screen PHQ 2/9  Depression screen [facility] 2/9 01/05/2018 12/08/2017 08/19/2017 06/24/2017 05/26/2017 03/04/2017 10/07/2016  Decreased Interest 1 1 1  0 1 1 0  Down, Depressed, Hopeless 1 1 1  0 1 1 0  PHQ - 2 Score 2 2 2  0 2 2 0  Altered sleeping - - 0 - - - -  Tired, decreased energy - - 1 - - - -  Change in appetite - - 1 - - - -  Feeling bad or failure about yourself  - - 1 - - - -  Trouble concentrating - - 0 - - - -  Moving slowly or fidgety/restless - - 0 - - - -  Suicidal thoughts - - 0 - - - -  PHQ-9 Score - - 5 - - - -  Difficult doing work/chores - - - - - - -  Some recent data might be hidden     Review of Systems  Constitutional: Negative.   HENT: Negative.   Eyes: Negative.   Respiratory: Negative.   Cardiovascular: Negative.   Gastrointestinal: Negative.   Endocrine: Negative.   Genitourinary: Negative.   Musculoskeletal: Positive for arthralgias, back pain and myalgias.  Skin: Negative.   Allergic/Immunologic: Negative.   Neurological: Negative.   Hematological: Negative.   Psychiatric/Behavioral: Negative.   All other systems reviewed and are negative.      Objective:   Physical Exam  Constitutional: She is oriented to person, place, and time. She appears well-developed and well-nourished.  HENT:  Head: Normocephalic and atraumatic.  Neck: Normal range of motion. Neck supple.  Cardiovascular: Normal rate and regular rhythm.  Pulmonary/Chest: Effort normal and  breath sounds normal.  Musculoskeletal:  Normal Muscle Bulk and Muscle Testing Reveals: Upper Extremities: Full ROM and Muscle strength 5/5 Thoracic Paraspinal Tenderness: T-7-T-9 Mainly Right Side Lumbar Paraspinal Tenderness: L-3-L-5 Lower Extremities: Right: BKA Left: Decreased ROM and Muscle Strength 4/5 Left Lower Extremity Flexion Produces Pain into Patella Left Lower Extremity Coban Dressing Intact Arrived in wheelchair  Neurological: She is alert and oriented to person, place, and time.  Skin: Skin is warm and dry.  Psychiatric: She has a normal mood and affect. Her behavior is normal.  Nursing note and vitals reviewed.         Assessment & Plan:  1.Right below-knee amputation, history of phantom limb pain.: ContinuewithCurrent Medication Regime and Continue to Monitor. 09/30//2019 2. Peripheral neuropathy: Continuewith current treatment regimen:Gabapentin, Topamaxand Cymbalta. 02/14/2018 3. Chronic Pain Syndrome:Continue Current Treatment Regimen: Refilled: Fenatnyl Patch 50 mcg one patch every three days #10 and Continue Oxycodone 10/325mg  one tablet every 6 hours as needed #120. 02/14/2018 We will continue the opioid monitoring program, this consists of regular clinic visits, examinations, urine drug screen, pill counts as well as use of [place] Controlled Substance Reporting System. 4. History of rheumatoid arthritis. Continue Current Medication and Exercise and heat Therapy. 09/30//2019 5. Osteoarthritis of left knee:Continuecurrent treatment regimen with:Voltaren gel/ Exercise and heat therapy. 02/14/2018 6. Chronic cellulitis, left legulcerwith associated edema:Wound Care Following:Attending [facility] weekly and [facility]  changing dressing twice a week. 02/14/2018 7. Lumbar Spondylosis: Continue current medication regime. Continue HEPas tolerated. Continue to Monitor.02/14/2018 8. Chronic Right Shoulder Pain:Continue HEP as  Tolerated. 09/30//2019 9. Muscle Spasm:Continue current treatment regimen withFlexeril. 02/14/2018 10. 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 02/14/2018 11. Post Herpetic Neuralgia: Continue Gabapentin and Lidocaine Patches. 02/14/2018. 12. Chronic thoracic Back Pain: Continue HEP as Tolerated. Continue current medication regimen.   20 minutes of face to face patient care time was spent during this visit. All questions were encouraged and answered.  F/U in 1 month

## 2018-03-08 ENCOUNTER — Other Ambulatory Visit: Payer: Self-pay | Admitting: Physical Medicine & Rehabilitation

## 2018-03-10 ENCOUNTER — Encounter: Payer: Medicare Other | Admitting: Registered Nurse

## 2018-03-11 ENCOUNTER — Encounter: Payer: Medicare Other | Attending: Physical Medicine and Rehabilitation | Admitting: Registered Nurse

## 2018-03-11 ENCOUNTER — Other Ambulatory Visit: Payer: Self-pay

## 2018-03-11 ENCOUNTER — Encounter: Payer: Self-pay | Admitting: Registered Nurse

## 2018-03-11 VITALS — BP 122/70 | HR 67

## 2018-03-11 DIAGNOSIS — G609 Hereditary and idiopathic neuropathy, unspecified: Secondary | ICD-10-CM | POA: Insufficient documentation

## 2018-03-11 DIAGNOSIS — G894 Chronic pain syndrome: Secondary | ICD-10-CM

## 2018-03-11 DIAGNOSIS — Z5181 Encounter for therapeutic drug level monitoring: Secondary | ICD-10-CM

## 2018-03-11 DIAGNOSIS — M4716 Other spondylosis with myelopathy, lumbar region: Secondary | ICD-10-CM | POA: Diagnosis present

## 2018-03-11 DIAGNOSIS — M546 Pain in thoracic spine: Secondary | ICD-10-CM

## 2018-03-11 DIAGNOSIS — G8929 Other chronic pain: Secondary | ICD-10-CM

## 2018-03-11 DIAGNOSIS — M069 Rheumatoid arthritis, unspecified: Secondary | ICD-10-CM | POA: Diagnosis present

## 2018-03-11 DIAGNOSIS — M179 Osteoarthritis of knee, unspecified: Secondary | ICD-10-CM | POA: Insufficient documentation

## 2018-03-11 DIAGNOSIS — Z89519 Acquired absence of unspecified leg below knee: Secondary | ICD-10-CM | POA: Insufficient documentation

## 2018-03-11 DIAGNOSIS — G546 Phantom limb syndrome with pain: Secondary | ICD-10-CM

## 2018-03-11 DIAGNOSIS — M47816 Spondylosis without myelopathy or radiculopathy, lumbar region: Secondary | ICD-10-CM

## 2018-03-11 DIAGNOSIS — M1712 Unilateral primary osteoarthritis, left knee: Secondary | ICD-10-CM

## 2018-03-11 DIAGNOSIS — Z79899 Other long term (current) drug therapy: Secondary | ICD-10-CM

## 2018-03-11 MED ORDER — OXYCODONE-ACETAMINOPHEN 10-325 MG PO TABS: 1.0000 | ORAL_TABLET | Freq: Four times a day (QID) | ORAL | 0 refills | Status: DC | PRN

## 2018-03-11 MED ORDER — CYCLOBENZAPRINE HCL 10 MG PO TABS: ORAL_TABLET | ORAL | 2 refills | Status: DC

## 2018-03-11 MED ORDER — FENTANYL 50 MCG/HR TD PT72: 50.0000 ug | MEDICATED_PATCH | TRANSDERMAL | 0 refills | Status: DC

## 2018-03-11 NOTE — Progress Notes (Signed)
Subjective:    Patient ID: Crystal Fitzpatrick, female    DOB: 1945-09-07, 72 y.o.   MRN: [id number]  HPI: Crystal Fitzpatrick is a 72 year old female who returns for follow up appointment for chronic pain and medication refill. She states her pain is located in her mid-lower back and left knee. She rates her pain 7. Her current exercise regime is performing stretching exercises daily. She rates her pain 7.   Crystal Fitzpatrick Equivalent is 180.00 MME. Last UDS was Performed on 11/04/2017, it was consistent.   Pain Inventory Average Pain 8 Pain Right Now 7 My pain is sharp, burning, stabbing and aching  In the last 24 hours, has pain interfered with the following? General activity 6 Relation with others 8 Enjoyment of life 7 What TIME of day is your pain at its worst? morning night Sleep (in general) Poor  Pain is worse with: bending, sitting and inactivity Pain improves with: heat/ice and medication Relief from Meds: 4  Mobility use a wheelchair transfers alone  Function Do you have any goals in this area?  no  Neuro/Psych No problems in this area  Prior Studies Any changes since last visit?  no  Physicians involved in your care Any changes since last visit?  no   Family History  Problem Relation Age of Onset  . Breast cancer Sister   . Diabetes Other   . Stroke Other        Grandparents   Social History   Socioeconomic History  . Marital status: Divorced    Spouse name: Not on file  . Number of children: 1  . Years of education: Not on file  . Highest education level: Not on file  Occupational History  . Occupation: retired Oncologist: RETIRED  Social Needs  . Financial resource strain: Not on file  . Food insecurity:    Worry: Not on file    Inability: Not on file  . Transportation needs:    Medical: Not on file    Non-medical: Not on file  Tobacco Use  . Smoking status: Former Games developer  . Smokeless tobacco: Never Used    Substance and Sexual Activity  . Alcohol use: No    Alcohol/week: 0.0 standard drinks  . Drug use: No  . Sexual activity: Not on file  Lifestyle  . Physical activity:    Days per week: Not on file    Minutes per session: Not on file  . Stress: Not on file  Relationships  . Social connections:    Talks on phone: Not on file    Gets together: Not on file    Attends religious service: Not on file    Active member of club or organization: Not on file    Attends meetings of clubs or organizations: Not on file    Relationship status: Not on file  Other Topics Concern  . Not on file  Social History Narrative  . Not on file   Past Surgical History:  Procedure Laterality Date  . AMPUTATION  02/02/2008   below right knee  . foot surgury  1980 and 1981  . NASAL SEPTUM SURGERY    . 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 s/p Breast biopsy  1992  . s/p EGD  2007   with Botox for? Achalasia  . TONSILLECTOMY    . TUBAL LIGATION     Past Medical History:  Diagnosis Date  .  ABDOMINAL DISTENSION 10/11/2008  . Acute gouty arthropathy 12/11/2008  . Altered mental status 02/07/2010  . AMPUTATION, BELOW KNEE, RIGHT, HX OF 01/28/2009  . ANAPHYLACTIC SHOCK 05/15/2009  . ANEMIA-NOS 07/17/2008  . ANXIETY 07/17/2008  . ARTHRITIS 01/28/2009  . B12 DEFICIENCY 07/17/2008  . Cellulitis and abscess of leg, except foot 11/15/2008  . CHF 01/28/2009  . Chronic pain syndrome 02/14/2010  . COLONIC POLYPS, HX OF 07/17/2008  . DIABETES MELLITUS, TYPE II 07/17/2008  . Dysuria 11/11/2009  . FOOT PAIN 07/17/2008  . GERD 07/17/2008  . HEARING LOSS, RIGHT EAR 06/04/2009  . HYPERKALEMIA 10/31/2009  . Hyperlipemia 09/24/2010  . Hyperlipidemia 09/24/2010  . HYPERSOMNIA 02/14/2010  . HYPERTENSION 07/17/2008  . HYPONATREMIA 03/20/2010  . Hypoxemia 03/20/2010  . IBS 06/04/2009  . MENOPAUSAL DISORDER 12/03/2009  . NEUROPATHY, HX OF 01/28/2009  . Osteoporosis 11/16/2014  . PERIPHERAL EDEMA 08/30/2008  . Pneumonia, aspiration (HCC)  12/01/2011   Episode July 2013, tx per [facility]  . Pneumonia, organism unspecified(486) 02/14/2010  . RENAL INSUFFICIENCY 10/31/2009  . Rheumatoid arthritis(714.0) 07/17/2008  . Seizure (HCC) 10/26/2010  . SKIN LESION 06/04/2009  . SKIN ULCER, CHRONIC 02/08/2009  . Spinal stenosis of lumbar region 05/15/2015  . Trigeminal neuralgia 02/14/2010  . UNSPECIFIED HEPATITIS 01/28/2009  . Wheezing 12/03/2009   BP 122/70   Pulse 67   SpO2 97%   Opioid Risk Score:   Fall Risk Score:  `1  Depression screen PHQ 2/9  Depression screen [facility] 2/9 01/05/2018 12/08/2017 08/19/2017 06/24/2017 05/26/2017 03/04/2017 10/07/2016  Decreased Interest 1 1 1  0 1 1 0  Down, Depressed, Hopeless 1 1 1  0 1 1 0  PHQ - 2 Score 2 2 2  0 2 2 0  Altered sleeping - - 0 - - - -  Tired, decreased energy - - 1 - - - -  Change in appetite - - 1 - - - -  Feeling bad or failure about yourself  - - 1 - - - -  Trouble concentrating - - 0 - - - -  Moving slowly or fidgety/restless - - 0 - - - -  Suicidal thoughts - - 0 - - - -  PHQ-9 Score - - 5 - - - -  Difficult doing work/chores - - - - - - -  Some recent data might be hidden    Review of Systems  Constitutional: Negative.   HENT: Negative.   Eyes: Negative.   Respiratory: Negative.   Cardiovascular: Negative.   Gastrointestinal: Negative.   Endocrine: Negative.   Genitourinary: Negative.   Musculoskeletal: Negative.   Skin: Negative.   Allergic/Immunologic: Negative.   Neurological: Negative.   Hematological: Negative.   Psychiatric/Behavioral: Negative.   All other systems reviewed and are negative.      Objective:   Physical Exam  Constitutional: She is oriented to person, place, and time. She appears well-developed and well-nourished.  HENT:  Head: Normocephalic and atraumatic.  Neck: Normal range of motion. Neck supple.  Cardiovascular: Normal rate and regular rhythm.  Pulmonary/Chest: Effort normal and breath sounds normal.  Musculoskeletal:   Normal Muscle Bulk and Muscle Testing Reveals: Upper Extremities: Full ROM and Muscle Strength 5/5 Thoracic Paraspinal Tenderness: T-7-T-9 Lumbar Paraspinal Tenderness: L-3-L-5 Lower Extremities: Right: BKA Left: Decreased ROM and Muscle Strength 4/5 Left Lower Extremity with Coban Wrap Arrived in wheelchair  Neurological: She is alert and oriented to person, place, and time.  Skin: Skin is warm and dry.  Psychiatric: She  has a normal mood and affect. Her behavior is normal.  Nursing note and vitals reviewed.         Assessment & Plan:  1.Right below-knee amputation, history of phantom limb pain.: ContinuewithCurrent Medication Regime and Continue to Monitor. 10/25//2019 2. Peripheral neuropathy: Continuewith current treatment regimen:Gabapentin, Topamaxand Cymbalta. 03/11/2018 3. Chronic Pain Syndrome:Continue Current Treatment Regimen: Refilled: Fenatnyl Patch 50 mcg one patch every three days #10 and Continue Oxycodone 10/325mg  one tablet every 6 hours as needed #120. 03/11/2018 We will continue the opioid monitoring program, this consists of regular clinic visits, examinations, urine drug screen, pill counts as well as use of [place] Controlled Substance Reporting System. 4. History of rheumatoid arthritis. Continue Current Medication and Exercise and heat Therapy. 10/25//2019 5. Osteoarthritis of left knee:Continuecurrent treatment regimen with:Voltaren gel/ Exercise and heat therapy. 03/11/2018 6. Chronic cellulitis, left legulcerwith associated edema:Wound Care Following:Attending [facility] weekly andFamily Friendchanging dressing twice a week. 03/11/2018 7. Lumbar Spondylosis: Continue current medication regime. Continue HEPas tolerated. Continue to Monitor.03/11/2018 8. Chronic Right Shoulder Pain No complaints today. :Continue HEP as Tolerated. 10/25//2019 9. Muscle Spasm:Continue current treatment regimen withFlexeril.  03/11/2018 10. 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 03/11/2018 11. Post Herpetic Neuralgia: Continue Gabapentin and Lidocaine Patches. 03/11/2018. 12. Chronic thoracic Back Pain: Continue HEP as Tolerated. Continue current medication regimen. 03/11/2018.   20 minutes of face to face patient care time was spent during this visit. All questions were encouraged and answered.  F/U in 1 month

## 2018-04-07 ENCOUNTER — Encounter: Payer: Medicare Other | Attending: Physical Medicine and Rehabilitation | Admitting: Registered Nurse

## 2018-04-07 ENCOUNTER — Encounter: Payer: Self-pay | Admitting: *Deleted

## 2018-04-07 ENCOUNTER — Encounter: Payer: Self-pay | Admitting: Registered Nurse

## 2018-04-07 ENCOUNTER — Other Ambulatory Visit: Payer: Self-pay

## 2018-04-07 VITALS — BP 166/80 | HR 82

## 2018-04-07 DIAGNOSIS — G546 Phantom limb syndrome with pain: Secondary | ICD-10-CM | POA: Diagnosis not present

## 2018-04-07 DIAGNOSIS — M47816 Spondylosis without myelopathy or radiculopathy, lumbar region: Secondary | ICD-10-CM

## 2018-04-07 DIAGNOSIS — Z89519 Acquired absence of unspecified leg below knee: Secondary | ICD-10-CM | POA: Diagnosis present

## 2018-04-07 DIAGNOSIS — M4716 Other spondylosis with myelopathy, lumbar region: Secondary | ICD-10-CM | POA: Diagnosis present

## 2018-04-07 DIAGNOSIS — M25511 Pain in right shoulder: Secondary | ICD-10-CM

## 2018-04-07 DIAGNOSIS — M546 Pain in thoracic spine: Secondary | ICD-10-CM | POA: Diagnosis not present

## 2018-04-07 DIAGNOSIS — M1712 Unilateral primary osteoarthritis, left knee: Secondary | ICD-10-CM

## 2018-04-07 DIAGNOSIS — G609 Hereditary and idiopathic neuropathy, unspecified: Secondary | ICD-10-CM | POA: Diagnosis present

## 2018-04-07 DIAGNOSIS — Z79899 Other long term (current) drug therapy: Secondary | ICD-10-CM

## 2018-04-07 DIAGNOSIS — Z5181 Encounter for therapeutic drug level monitoring: Secondary | ICD-10-CM

## 2018-04-07 DIAGNOSIS — M179 Osteoarthritis of knee, unspecified: Secondary | ICD-10-CM | POA: Diagnosis present

## 2018-04-07 DIAGNOSIS — M069 Rheumatoid arthritis, unspecified: Secondary | ICD-10-CM | POA: Insufficient documentation

## 2018-04-07 DIAGNOSIS — G8929 Other chronic pain: Secondary | ICD-10-CM

## 2018-04-07 DIAGNOSIS — G894 Chronic pain syndrome: Secondary | ICD-10-CM

## 2018-04-07 DIAGNOSIS — M62838 Other muscle spasm: Secondary | ICD-10-CM

## 2018-04-07 MED ORDER — OXYCODONE-ACETAMINOPHEN 10-325 MG PO TABS: 1.0000 | ORAL_TABLET | Freq: Four times a day (QID) | ORAL | 0 refills | Status: DC | PRN

## 2018-04-07 MED ORDER — FENTANYL 50 MCG/HR TD PT72: 50.0000 ug | MEDICATED_PATCH | TRANSDERMAL | 0 refills | Status: DC

## 2018-04-07 NOTE — Progress Notes (Signed)
Subjective:    Patient ID: Crystal Fitzpatrick, female    DOB: [birth year], 72 y.o.   MRN: [id number]  HPI: Crystal Fitzpatrick is a 72 year old female who returns for follow up appointment for chronic pain and medication refill. She states her pain is located in her right shoulder, mid-lower back, left lower extremity and left knee. She rates her pain 7. Her current exercise regime is regime is performing stretching exercises daily.   Crystal Fitzpatrick Morphine Equivalent is 180.00 MME. Last UDS was Performed on 11/04/2017, it was consistent.   Pain Inventory Average Pain 8 Pain Right Now 7 My pain is sharp, burning, stabbing and aching  In the last 24 hours, has pain interfered with the following? General activity 7 Relation with others 7 Enjoyment of life 5 What TIME of day is your pain at its worst? morning and night Sleep (in general) Good  Pain is worse with: sitting and inactivity Pain improves with: rest, therapy/exercise and medication Relief from Meds: 6  Mobility use a wheelchair  Function disabled: date disabled n/a  Neuro/Psych No problems in this area  Prior Studies Any changes since last visit?  no  Physicians involved in your care Any changes since last visit?  no   Family History  Problem Relation Age of Onset  . Breast cancer Sister   . Diabetes Other   . Stroke Other        Grandparents   Social History   Socioeconomic History  . Marital status: Divorced    Spouse name: Not on file  . Number of children: 1  . Years of education: Not on file  . Highest education level: Not on file  Occupational History  . Occupation: retired Oncologist: RETIRED  Social Needs  . Financial resource strain: Not on file  . Food insecurity:    Worry: Not on file    Inability: Not on file  . Transportation needs:    Medical: Not on file    Non-medical: Not on file  Tobacco Use  . Smoking status: Former Games developer  . Smokeless tobacco: Never  Used  Substance and Sexual Activity  . Alcohol use: No    Alcohol/week: 0.0 standard drinks  . Drug use: No  . Sexual activity: Not on file  Lifestyle  . Physical activity:    Days per week: Not on file    Minutes per session: Not on file  . Stress: Not on file  Relationships  . Social connections:    Talks on phone: Not on file    Gets together: Not on file    Attends religious service: Not on file    Active member of club or organization: Not on file    Attends meetings of clubs or organizations: Not on file    Relationship status: Not on file  Other Topics Concern  . Not on file  Social History Narrative  . Not on file   Past Surgical History:  Procedure Laterality Date  . AMPUTATION  02/02/2008   below right knee  . foot surgury  1980 and 1981  . NASAL SEPTUM SURGERY    . 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 s/p Breast biopsy  1992  . s/p EGD  2007   with Botox for? Achalasia  . TONSILLECTOMY    . TUBAL LIGATION     Past Medical History:  Diagnosis Date  . ABDOMINAL DISTENSION 10/11/2008  .  Acute gouty arthropathy 12/11/2008  . Altered mental status 02/07/2010  . AMPUTATION, BELOW KNEE, RIGHT, HX OF 01/28/2009  . ANAPHYLACTIC SHOCK 05/15/2009  . ANEMIA-NOS 07/17/2008  . ANXIETY 07/17/2008  . ARTHRITIS 01/28/2009  . B12 DEFICIENCY 07/17/2008  . Cellulitis and abscess of leg, except foot 11/15/2008  . CHF 01/28/2009  . Chronic pain syndrome 02/14/2010  . COLONIC POLYPS, HX OF 07/17/2008  . DIABETES MELLITUS, TYPE II 07/17/2008  . Dysuria 11/11/2009  . FOOT PAIN 07/17/2008  . GERD 07/17/2008  . HEARING LOSS, RIGHT EAR 06/04/2009  . HYPERKALEMIA 10/31/2009  . Hyperlipemia 09/24/2010  . Hyperlipidemia 09/24/2010  . HYPERSOMNIA 02/14/2010  . HYPERTENSION 07/17/2008  . HYPONATREMIA 03/20/2010  . Hypoxemia 03/20/2010  . IBS 06/04/2009  . MENOPAUSAL DISORDER 12/03/2009  . NEUROPATHY, HX OF 01/28/2009  . Osteoporosis 11/16/2014  . PERIPHERAL EDEMA 08/30/2008  . Pneumonia, aspiration  (HCC) 12/01/2011   Episode July 2013, tx per [facility]  . Pneumonia, organism unspecified(486) 02/14/2010  . RENAL INSUFFICIENCY 10/31/2009  . Rheumatoid arthritis(714.0) 07/17/2008  . Seizure (HCC) 10/26/2010  . SKIN LESION 06/04/2009  . SKIN ULCER, CHRONIC 02/08/2009  . Spinal stenosis of lumbar region 05/15/2015  . Trigeminal neuralgia 02/14/2010  . UNSPECIFIED HEPATITIS 01/28/2009  . Wheezing 12/03/2009   BP (!) 166/80   Pulse 82   SpO2 94%   Opioid Risk Score:   Fall Risk Score:  `1  Depression screen PHQ 2/9  Depression screen [facility] 2/9 04/07/2018 03/11/2018 01/05/2018 12/08/2017 08/19/2017 06/24/2017 05/26/2017  Decreased Interest 1 1 1 1 1  0 1  Down, Depressed, Hopeless 1 1 1 1 1  0 1  PHQ - 2 Score 2 2 2 2 2  0 2  Altered sleeping - - - - 0 - -  Tired, decreased energy - - - - 1 - -  Change in appetite - - - - 1 - -  Feeling bad or failure about yourself  - - - - 1 - -  Trouble concentrating - - - - 0 - -  Moving slowly or fidgety/restless - - - - 0 - -  Suicidal thoughts - - - - 0 - -  PHQ-9 Score - - - - 5 - -  Difficult doing work/chores - - - - - - -  Some recent data might be hidden   Review of Systems  Constitutional: Negative.   HENT: Negative.   Eyes: Negative.   Respiratory: Negative.   Cardiovascular: Negative.   Gastrointestinal: Negative.   Endocrine: Negative.   Genitourinary: Negative.   Musculoskeletal: Negative.   Skin: Negative.   Allergic/Immunologic: Negative.   Neurological: Negative.   Hematological: Negative.   Psychiatric/Behavioral: Negative.   All other systems reviewed and are negative.      Objective:   Physical Exam  Constitutional: She is oriented to person, place, and time. She appears well-developed and well-nourished.  HENT:  Head: Normocephalic and atraumatic.  Neck: Normal range of motion. Neck supple.  Cardiovascular: Normal rate and regular rhythm.  Pulmonary/Chest: Effort normal and breath sounds normal.    Genitourinary: No vaginal discharge found.  Musculoskeletal:  Normal Muscle Bulk and Muscle Testing Reveals: Upper Extremities: Full ROM and Muscle Strength 5/5 Right AC Joint Tenderness Thoracic Paraspinal Tenderness: T-3-T-7 Lumbar Paraspinal Tenderness: L-4-L-5 Lower Extremities: Right BKA Left: Full ROM and Muscle Strength 5/5 Left Lower Extremity Flexion Produces Pain into Patella Arrived in wheelchair  Neurological: She is alert and oriented to person, place, and time.  Skin:  Skin is warm and dry.  Psychiatric: She has a normal mood and affect. Her behavior is normal.  Nursing note and vitals reviewed.         Assessment & Plan:  1.Right below-knee amputation, history of phantom limb pain.: ContinuewithCurrent Medication Regime and Continue to Monitor. 11/21//2019 2. Peripheral neuropathy: Continuewith current treatment regimen:Gabapentin, Topamaxand Cymbalta. 04/07/2018 3. Chronic Pain Syndrome:Continue Current Treatment Regimen: Refilled: Fenatnyl Patch56mcg one patch every three days #10 and Continue Oxycodone 10/325mg  one tablet every 6 hours as needed #120. 04/07/2018 We will continue the opioid monitoring program, this consists of regular clinic visits, examinations, urine drug screen, pill counts as well as use of [place] Controlled Substance Reporting System. 4. History of rheumatoid arthritis. Continue Current Medication and Exercise and heat Therapy. 11/21//2019 5. Osteoarthritis of left knee:Continuecurrent treatment regimen with:Voltaren gel/ Exercise and heat therapy. 04/07/2018 6. Chronic cellulitis, left legulcerwith associated edema:Wound Care Following:Attending [facility] weekly andFamily Friendchanging dressing twice a week. 03/11/2018 7. Lumbar Spondylosis: Continue current medication regime. Continue HEPas tolerated. Continue to Monitor.03/11/2018 8. Chronic Right Shoulder Pain No complaints today. :Continue HEP  as Tolerated. 10/25//2019 9. Muscle Spasm:Continue current treatment regimen withFlexeril. 03/11/2018 10. 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 04/07/2018 11. Post Herpetic Neuralgia: Continue Gabapentin and Lidocaine Patches. 04/07/2018. 12. Chronic thoracic Back Pain: Continue HEP as Tolerated. Continue current medication regimen. 04/07/2018.   20 minutes of face to face patient care time was spent during this visit. All questions were encouraged and answered.  F/U in 1 month

## 2018-04-08 ENCOUNTER — Other Ambulatory Visit: Payer: Self-pay | Admitting: Registered Nurse

## 2018-04-08 DIAGNOSIS — G546 Phantom limb syndrome with pain: Secondary | ICD-10-CM

## 2018-04-08 DIAGNOSIS — G609 Hereditary and idiopathic neuropathy, unspecified: Secondary | ICD-10-CM

## 2018-04-08 DIAGNOSIS — M4716 Other spondylosis with myelopathy, lumbar region: Secondary | ICD-10-CM

## 2018-04-26 ENCOUNTER — Telehealth: Payer: Self-pay

## 2018-04-26 MED ORDER — DULOXETINE HCL 60 MG PO CPEP: 120.0000 mg | ORAL_CAPSULE | Freq: Every day | ORAL | 2 refills | Status: DC

## 2018-04-26 NOTE — Telephone Encounter (Signed)
Received a fax for refill request Duloxetine. Refill sent

## 2018-04-28 ENCOUNTER — Other Ambulatory Visit: Payer: Self-pay | Admitting: Physical Medicine & Rehabilitation

## 2018-05-04 ENCOUNTER — Encounter: Payer: Medicare Other | Attending: Physical Medicine and Rehabilitation | Admitting: Registered Nurse

## 2018-05-04 ENCOUNTER — Encounter: Payer: Self-pay | Admitting: Registered Nurse

## 2018-05-04 VITALS — BP 131/77 | HR 61 | Resp 14

## 2018-05-04 DIAGNOSIS — G8929 Other chronic pain: Secondary | ICD-10-CM

## 2018-05-04 DIAGNOSIS — M546 Pain in thoracic spine: Secondary | ICD-10-CM | POA: Diagnosis not present

## 2018-05-04 DIAGNOSIS — G894 Chronic pain syndrome: Secondary | ICD-10-CM

## 2018-05-04 DIAGNOSIS — M542 Cervicalgia: Secondary | ICD-10-CM

## 2018-05-04 DIAGNOSIS — Z79899 Other long term (current) drug therapy: Secondary | ICD-10-CM

## 2018-05-04 DIAGNOSIS — M069 Rheumatoid arthritis, unspecified: Secondary | ICD-10-CM | POA: Insufficient documentation

## 2018-05-04 DIAGNOSIS — G609 Hereditary and idiopathic neuropathy, unspecified: Secondary | ICD-10-CM

## 2018-05-04 DIAGNOSIS — Z5181 Encounter for therapeutic drug level monitoring: Secondary | ICD-10-CM

## 2018-05-04 DIAGNOSIS — M1712 Unilateral primary osteoarthritis, left knee: Secondary | ICD-10-CM

## 2018-05-04 DIAGNOSIS — G546 Phantom limb syndrome with pain: Secondary | ICD-10-CM | POA: Diagnosis not present

## 2018-05-04 DIAGNOSIS — M179 Osteoarthritis of knee, unspecified: Secondary | ICD-10-CM | POA: Insufficient documentation

## 2018-05-04 DIAGNOSIS — B0229 Other postherpetic nervous system involvement: Secondary | ICD-10-CM

## 2018-05-04 DIAGNOSIS — M47816 Spondylosis without myelopathy or radiculopathy, lumbar region: Secondary | ICD-10-CM

## 2018-05-04 DIAGNOSIS — M4716 Other spondylosis with myelopathy, lumbar region: Secondary | ICD-10-CM | POA: Diagnosis present

## 2018-05-04 DIAGNOSIS — Z89519 Acquired absence of unspecified leg below knee: Secondary | ICD-10-CM | POA: Diagnosis present

## 2018-05-04 MED ORDER — FENTANYL 50 MCG/HR TD PT72: 50.0000 ug | MEDICATED_PATCH | TRANSDERMAL | 0 refills | Status: DC

## 2018-05-04 MED ORDER — OXYCODONE-ACETAMINOPHEN 10-325 MG PO TABS: 1.0000 | ORAL_TABLET | Freq: Four times a day (QID) | ORAL | 0 refills | Status: DC | PRN

## 2018-05-04 NOTE — Progress Notes (Signed)
Subjective:    Patient ID: Crystal Fitzpatrick, female    DOB: 1945/09/16, 72 y.o.   MRN: [id number]  HPI: Crystal Fitzpatrick is a 72 y.o. female who returns for follow up appointment for chronic pain and medication refill. She states her pain is located in her neck, mid- lower back pain and left knee pain. She rates her pain 7. Her current exercise regime is performing stretching exercises.  [name] Morphine equivalent is 180.00 MME. Last UDS was Performed on 11/04/2017, it was consistent.   Pain Inventory Average Pain 7 Pain Right Now 7 My pain is sharp, burning, stabbing and aching  In the last 24 hours, has pain interfered with the following? General activity 6 Relation with others 7 Enjoyment of life 5 What TIME of day is your pain at its worst? morning, night Sleep (in general) Poor  Pain is worse with: sitting and inactivity Pain improves with: therapy/exercise and medication Relief from Meds: 6  Mobility use a wheelchair  Function disabled: date disabled .  Neuro/Psych trouble walking  Prior Studies Any changes since last visit?  no  Physicians involved in your care Any changes since last visit?  no   Family History  Problem Relation Age of Onset  . Breast cancer Sister   . Diabetes Other   . Stroke Other        Grandparents   Social History   Socioeconomic History  . Marital status: Divorced    Spouse name: Not on file  . Number of children: 1  . Years of education: Not on file  . Highest education level: Not on file  Occupational History  . Occupation: retired Oncologist: RETIRED  Social Needs  . Financial resource strain: Not on file  . Food insecurity:    Worry: Not on file    Inability: Not on file  . Transportation needs:    Medical: Not on file    Non-medical: Not on file  Tobacco Use  . Smoking status: Former Games developer  . Smokeless tobacco: Never Used  Substance and Sexual Activity  . Alcohol use: No   Alcohol/week: 0.0 standard drinks  . Drug use: No  . Sexual activity: Not on file  Lifestyle  . Physical activity:    Days per week: Not on file    Minutes per session: Not on file  . Stress: Not on file  Relationships  . Social connections:    Talks on phone: Not on file    Gets together: Not on file    Attends religious service: Not on file    Active member of club or organization: Not on file    Attends meetings of clubs or organizations: Not on file    Relationship status: Not on file  Other Topics Concern  . Not on file  Social History Narrative  . Not on file   Past Surgical History:  Procedure Laterality Date  . AMPUTATION  02/02/2008   below right knee  . foot surgury  1980 and 1981  . NASAL SEPTUM SURGERY    . 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 s/p Breast biopsy  1992  . s/p EGD  2007   with Botox for? Achalasia  . TONSILLECTOMY    . TUBAL LIGATION     Past Medical History:  Diagnosis Date  . ABDOMINAL DISTENSION 10/11/2008  . Acute gouty arthropathy 12/11/2008  . Altered mental status 02/07/2010  . AMPUTATION,  BELOW KNEE, RIGHT, HX OF 01/28/2009  . ANAPHYLACTIC SHOCK 05/15/2009  . ANEMIA-NOS 07/17/2008  . ANXIETY 07/17/2008  . ARTHRITIS 01/28/2009  . B12 DEFICIENCY 07/17/2008  . Cellulitis and abscess of leg, except foot 11/15/2008  . CHF 01/28/2009  . Chronic pain syndrome 02/14/2010  . COLONIC POLYPS, HX OF 07/17/2008  . DIABETES MELLITUS, TYPE II 07/17/2008  . Dysuria 11/11/2009  . FOOT PAIN 07/17/2008  . GERD 07/17/2008  . HEARING LOSS, RIGHT EAR 06/04/2009  . HYPERKALEMIA 10/31/2009  . Hyperlipemia 09/24/2010  . Hyperlipidemia 09/24/2010  . HYPERSOMNIA 02/14/2010  . HYPERTENSION 07/17/2008  . HYPONATREMIA 03/20/2010  . Hypoxemia 03/20/2010  . IBS 06/04/2009  . MENOPAUSAL DISORDER 12/03/2009  . NEUROPATHY, HX OF 01/28/2009  . Osteoporosis 11/16/2014  . PERIPHERAL EDEMA 08/30/2008  . Pneumonia, aspiration (HCC) 12/01/2011   Episode July 2013, tx [facility]  . Pneumonia, organism unspecified(486) 02/14/2010  . RENAL INSUFFICIENCY 10/31/2009  . Rheumatoid arthritis(714.0) 07/17/2008  . Seizure (HCC) 10/26/2010  . SKIN LESION 06/04/2009  . SKIN ULCER, CHRONIC 02/08/2009  . Spinal stenosis of lumbar region 05/15/2015  . Trigeminal neuralgia 02/14/2010  . UNSPECIFIED HEPATITIS 01/28/2009  . Wheezing 12/03/2009   BP 131/77   Pulse 61   Resp 14   SpO2 95%   Opioid Risk Score:   Fall Risk Score:  `1  Depression screen PHQ 2/9  Depression screen [facility] 2/9 04/07/2018 03/11/2018 01/05/2018 12/08/2017 08/19/2017 06/24/2017 05/26/2017  Decreased Interest 1 1 1 1 1  0 1  Down, Depressed, Hopeless 1 1 1 1 1  0 1  PHQ - 2 Score 2 2 2 2 2  0 2  Altered sleeping - - - - 0 - -  Tired, decreased energy - - - - 1 - -  Change in appetite - - - - 1 - -  Feeling bad or failure about yourself  - - - - 1 - -  Trouble concentrating - - - - 0 - -  Moving slowly or fidgety/restless - - - - 0 - -  Suicidal thoughts - - - - 0 - -  PHQ-9 Score - - - - 5 - -  Difficult doing work/chores - - - - - - -  Some recent data might be hidden    Review of Systems  Constitutional: Negative.   HENT: Negative.   Eyes: Negative.   Respiratory: Negative.   Cardiovascular: Negative.   Gastrointestinal: Negative.   Endocrine: Negative.   Genitourinary: Negative.   Musculoskeletal: Positive for arthralgias, gait problem and myalgias.  Skin: Negative.   Allergic/Immunologic: Negative.   Psychiatric/Behavioral: Negative.   All other systems reviewed and are negative.      Objective:   Physical Exam Vitals signs and nursing note reviewed.  Constitutional:      Appearance: Normal appearance.  Neck:     Musculoskeletal: Normal range of motion and neck supple.     Comments: Cervical Paraspinal Tenderness: C-5-C-6 Cardiovascular:     Rate and Rhythm: Normal rate and regular rhythm.     Pulses: Normal pulses.     Heart sounds: Normal heart sounds.  Pulmonary:     Effort:  Pulmonary effort is normal.     Breath sounds: Normal breath sounds.  Musculoskeletal:     Comments: Normal Muscle Bulk and Muscle Testing Reveals:  Upper Extremities: Full ROM and Muscle Strength 5/5 Thoracic and Lumbar Hypersensitivity Lower Extremities: Right: BKA Left" Decreased ROM and Muscle Strength 5/5 Left Lower Extremity with  Coban wrap Arrived in wheelchair  Skin:    General: Skin is warm and dry.  Neurological:     Mental Status: She is alert and oriented to person, place, and time.  Psychiatric:        Mood and Affect: Mood normal.        Behavior: Behavior normal.           Assessment & Plan:  1.Right below-knee amputation, history of phantom limb pain.: ContinuewithCurrent Medication Regime and Continue to Monitor. 12/18//2019 2. Peripheral neuropathy: Continuewith current treatment regimen:Gabapentin, Topamaxand Cymbalta. 05/04/2018 3. Chronic Pain Syndrome:Continue Current Treatment Regimen: Refilled: Fenatnyl Patch66mcg one patch every three days #10 and Continue Oxycodone 10/325mg  one tablet every 6 hours as needed #120.05/04/2018 We will continue the opioid monitoring program, this consists of regular clinic visits, examinations, urine drug screen, pill counts as well as use of [place] Controlled Substance Reporting System. 4. History of rheumatoid arthritis. Continue Current Medication and Exercise and heat Therapy.12/18//2019 5. Osteoarthritis of left knee:Continuecurrent treatment regimen with:Voltaren gel/ Exercise and heat therapy. 05/04/2018 6. Chronic cellulitis, left legulcerwith associated edema:Wound Care Following:Attending [facility] weekly andFamily Friendchanging dressing twice a week. 05/04/2018 7. Lumbar Spondylosis: Continue current medication regime. Continue HEPas tolerated. Continue to Monitor.05/04/2018 8. Chronic Right Shoulder Pain No complaints today.:Continue HEP as Tolerated.12/18//2019 9.  Muscle Spasm:Continue current treatment regimen withFlexeril.05/04/2018 10. Stage II Pressure Ulcer/ Left Lower Extremity Ulcer: [facility] Following.05/04/2018 11. Post Herpetic Neuralgia: Continue Gabapentin and Lidocaine Patches.05/04/2018. 12. Chronic thoracic Back Pain: Continue HEP as Tolerated. Continue current medication regimen. 05/04/2018.  20 minutes of face to face patient care time was spent during this visit. All questions were encouraged and answered.  F/U in 1 month

## 2018-06-02 ENCOUNTER — Encounter: Payer: Self-pay | Admitting: Registered Nurse

## 2018-06-02 ENCOUNTER — Encounter: Payer: Medicare Other | Attending: Physical Medicine and Rehabilitation | Admitting: Registered Nurse

## 2018-06-02 ENCOUNTER — Other Ambulatory Visit: Payer: Self-pay

## 2018-06-02 VITALS — BP 135/80 | HR 85 | Resp 14

## 2018-06-02 DIAGNOSIS — Z89519 Acquired absence of unspecified leg below knee: Secondary | ICD-10-CM | POA: Insufficient documentation

## 2018-06-02 DIAGNOSIS — M546 Pain in thoracic spine: Secondary | ICD-10-CM

## 2018-06-02 DIAGNOSIS — G8929 Other chronic pain: Secondary | ICD-10-CM

## 2018-06-02 DIAGNOSIS — G609 Hereditary and idiopathic neuropathy, unspecified: Secondary | ICD-10-CM | POA: Insufficient documentation

## 2018-06-02 DIAGNOSIS — G546 Phantom limb syndrome with pain: Secondary | ICD-10-CM | POA: Diagnosis not present

## 2018-06-02 DIAGNOSIS — R239 Unspecified skin changes: Secondary | ICD-10-CM

## 2018-06-02 DIAGNOSIS — M179 Osteoarthritis of knee, unspecified: Secondary | ICD-10-CM | POA: Diagnosis present

## 2018-06-02 DIAGNOSIS — G894 Chronic pain syndrome: Secondary | ICD-10-CM

## 2018-06-02 DIAGNOSIS — M4716 Other spondylosis with myelopathy, lumbar region: Secondary | ICD-10-CM | POA: Insufficient documentation

## 2018-06-02 DIAGNOSIS — R601 Generalized edema: Secondary | ICD-10-CM

## 2018-06-02 DIAGNOSIS — Z5181 Encounter for therapeutic drug level monitoring: Secondary | ICD-10-CM

## 2018-06-02 DIAGNOSIS — M069 Rheumatoid arthritis, unspecified: Secondary | ICD-10-CM | POA: Insufficient documentation

## 2018-06-02 DIAGNOSIS — Z79899 Other long term (current) drug therapy: Secondary | ICD-10-CM

## 2018-06-02 DIAGNOSIS — M62838 Other muscle spasm: Secondary | ICD-10-CM

## 2018-06-02 NOTE — Progress Notes (Signed)
Subjective:    Patient ID: Crystal Fitzpatrick, female    DOB: [birth year], 73 y.o.   MRN: [id number]  HPI: Crystal Fitzpatrick is a 73 y.o. female who returns for follow up appointment for chronic pain and medication refill. She states her pain is located in her right shoulder, upper- lower back and left lower extremities. Also reports she has generalized pain all over and she has two opening area on her buttocks. Crystal Fitzpatrick has two open areas one on the right and left at her gluteal fold, area reddened with no drainage noted.  Also states her left lower extremity is swollen, area tender with palpation and warm to touch. Crystal Fitzpatrick wanted a antibiotic, she was instructed to go to the emergency room for evaluation, she states she wants to go to [facility], she refuses EMS. Her driver Apolinar Junes will take her to [facility] Emergency Room for evaluation. Crystal Fitzpatrick tearful and emotional support given.   She rates her pain 8. Her current exercise regime is performing stretching exercises daily.  Crystal Fitzpatrick Morphine equivalent is 180.00 MME.  Last UDS was performed on 11/04/2017, it was consistent.    Pain Inventory Average Pain 9 Pain Right Now 8 My pain is sharp, burning, stabbing and aching  In the last 24 hours, has pain interfered with the following? General activity 7 Relation with others 8 Enjoyment of life 4 What TIME of day is your pain at its worst? morning, night  Sleep (in general) Poor  Pain is worse with: sitting and inactivity Pain improves with: heat/ice and medication Relief from Meds: 5  Mobility walk with assistance use a walker use a wheelchair needs help with transfers  Function retired  Neuro/Psych trouble walking  Prior Studies Any changes since last visit?  no  Physicians involved in your care Any changes since last visit?  no   Family History  Problem Relation Age of Onset  . Breast cancer Sister   . Diabetes Other   . Stroke Other    Grandparents   Social History   Socioeconomic History  . Marital status: Divorced    Spouse name: Not on file  . Number of children: 1  . Years of education: Not on file  . Highest education level: Not on file  Occupational History  . Occupation: retired Oncologist: RETIRED  Social Needs  . Financial resource strain: Not on file  . Food insecurity:    Worry: Not on file    Inability: Not on file  . Transportation needs:    Medical: Not on file    Non-medical: Not on file  Tobacco Use  . Smoking status: Former Games developer  . Smokeless tobacco: Never Used  Substance and Sexual Activity  . Alcohol use: No    Alcohol/week: 0.0 standard drinks  . Drug use: No  . Sexual activity: Not on file  Lifestyle  . Physical activity:    Days per week: Not on file    Minutes per session: Not on file  . Stress: Not on file  Relationships  . Social connections:    Talks on phone: Not on file    Gets together: Not on file    Attends religious service: Not on file    Active member of club or organization: Not on file    Attends meetings of clubs or organizations: Not on file    Relationship status: Not on file  Other Topics Concern  .  Not on file  Social History Narrative  . Not on file   Past Surgical History:  Procedure Laterality Date  . AMPUTATION  02/02/2008   below right knee  . foot surgury  1980 and 1981  . NASAL SEPTUM SURGERY    . 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 s/p Breast biopsy  1992  . s/p EGD  2007   with Botox for? Achalasia  . TONSILLECTOMY    . TUBAL LIGATION     Past Medical History:  Diagnosis Date  . ABDOMINAL DISTENSION 10/11/2008  . Acute gouty arthropathy 12/11/2008  . Altered mental status 02/07/2010  . AMPUTATION, BELOW KNEE, RIGHT, HX OF 01/28/2009  . ANAPHYLACTIC SHOCK 05/15/2009  . ANEMIA-NOS 07/17/2008  . ANXIETY 07/17/2008  . ARTHRITIS 01/28/2009  . B12 DEFICIENCY 07/17/2008  . Cellulitis and abscess of leg,  except foot 11/15/2008  . CHF 01/28/2009  . Chronic pain syndrome 02/14/2010  . COLONIC POLYPS, HX OF 07/17/2008  . DIABETES MELLITUS, TYPE II 07/17/2008  . Dysuria 11/11/2009  . FOOT PAIN 07/17/2008  . GERD 07/17/2008  . HEARING LOSS, RIGHT EAR 06/04/2009  . HYPERKALEMIA 10/31/2009  . Hyperlipemia 09/24/2010  . Hyperlipidemia 09/24/2010  . HYPERSOMNIA 02/14/2010  . HYPERTENSION 07/17/2008  . HYPONATREMIA 03/20/2010  . Hypoxemia 03/20/2010  . IBS 06/04/2009  . MENOPAUSAL DISORDER 12/03/2009  . NEUROPATHY, HX OF 01/28/2009  . Osteoporosis 11/16/2014  . PERIPHERAL EDEMA 08/30/2008  . Pneumonia, aspiration (HCC) 12/01/2011   Episode July 2013, tx per [facility]  . Pneumonia, organism unspecified(486) 02/14/2010  . RENAL INSUFFICIENCY 10/31/2009  . Rheumatoid arthritis(714.0) 07/17/2008  . Seizure (HCC) 10/26/2010  . SKIN LESION 06/04/2009  . SKIN ULCER, CHRONIC 02/08/2009  . Spinal stenosis of lumbar region 05/15/2015  . Trigeminal neuralgia 02/14/2010  . UNSPECIFIED HEPATITIS 01/28/2009  . Wheezing 12/03/2009   BP 135/80   Pulse 85   Resp 14   SpO2 95%   Opioid Risk Score:   Fall Risk Score:  `1  Depression screen PHQ 2/9  Depression screen [facility] 2/9 04/07/2018 03/11/2018 01/05/2018 12/08/2017 08/19/2017 06/24/2017 05/26/2017  Decreased Interest 1 1 1 1 1  0 1  Down, Depressed, Hopeless 1 1 1 1 1  0 1  PHQ - 2 Score 2 2 2 2 2  0 2  Altered sleeping - - - - 0 - -  Tired, decreased energy - - - - 1 - -  Change in appetite - - - - 1 - -  Feeling bad or failure about yourself  - - - - 1 - -  Trouble concentrating - - - - 0 - -  Moving slowly or fidgety/restless - - - - 0 - -  Suicidal thoughts - - - - 0 - -  PHQ-9 Score - - - - 5 - -  Difficult doing work/chores - - - - - - -  Some recent data might be hidden    Review of Systems  Constitutional: Negative.   HENT: Negative.   Eyes: Negative.   Respiratory: Negative.   Cardiovascular: Negative.   Gastrointestinal: Negative.   Endocrine:  Negative.   Genitourinary: Negative.   Musculoskeletal: Positive for back pain and gait problem.  Skin: Positive for wound.  Allergic/Immunologic: Negative.   Hematological: Negative.   Psychiatric/Behavioral: Negative.   All other systems reviewed and are negative.      Objective:   Physical Exam Vitals signs and nursing note reviewed.  Neck:  Musculoskeletal: Normal range of motion and neck supple.  Cardiovascular:     Rate and Rhythm: Normal rate and regular rhythm.     Pulses: Normal pulses.     Heart sounds: Normal heart sounds.  Pulmonary:     Effort: Pulmonary effort is normal.     Breath sounds: Normal breath sounds.  Musculoskeletal:     Comments: Normal Muscle Bulk and Muscle Testing Reveals:  Upper Extremities: Full ROM and Muscle Strength 5/5 Right AC Joint Tenderness Thoracic Hypersensitivity: T-1-T-3 Lumbar Hypersensitivity  Lower Extremities: Right: BKA Left Lower Extremity: Anasarca Noted / Reddened/ Tenderness with Palpation Left Lower Extremity with Coban Wrap Arrived in wheelchair    Skin:    General: Skin is warm and dry.     Comments: Bilateral Gluteal Folds with open areas reddened/ no drainage  Neurological:     Mental Status: She is alert and oriented to person, place, and time.  Psychiatric:        Mood and Affect: Mood normal.        Behavior: Behavior normal.           Assessment & Plan:  1. Alteration in skin integrity due to moisture: Crystal Fitzpatrick has a schedule appointment with Wound Care.  2. Anasarca: RX: ED Evaluation  3. Possible Cellulitis: RX: Ed Evaluation  4.Right below-knee amputation, history of phantom limb pain.: ContinuewithCurrent Medication Regime and Continue to Monitor. 01/16//2020. 5. Peripheral neuropathy: Continuewith current treatment regimen:Gabapentin, Topamaxand Cymbalta. 06/02/2018 6. Chronic Pain Syndrome:Continue Current Treatment Regimen:  Fenatnyl Patch7450mcg one patch every three days #10  and Continue Oxycodone 10/325mg  one tablet every 6 hours as needed #120.06/02/2018 We will continue the opioid monitoring program, this consists of regular clinic visits, examinations, urine drug screen, pill counts as well as use of [place] Controlled Substance [place]. 7. History of rheumatoid arthritis. Continue Current Medication and Exercise and heat Therapy.01/16//2020 8. Osteoarthritis of left knee:Continuecurrent treatment regimen with:Voltaren gel/ Exercise and heat therapy. 06/02/2018 9. Chronic cellulitis, left legulcerwith associated edema:Wound Care Following:Attending [facility] weekly andFamily Friendchanging dressing twice a week. 06/02/2018 10. Lumbar Spondylosis: Continue current medication regime. Continue HEPas tolerated. Continue to Monitor.06/02/2018 11. Chronic Right Shoulder Pain :Continue HEP as Tolerated.01/16//2020 12. Muscle Spasm:Continue current treatment regimen withFlexeril.06/02/2018 13. Stage II Pressure Ulcer/ Left Lower Extremity Ulcer: [facility] Following.06/02/2018 14. Post Herpetic Neuralgia: Continue Gabapentin and Lidocaine Patches.06/02/2018 15.. Chronic thoracic Back Pain: Continue HEP as Tolerated. Continue current medication regimen. 06/02/2018  30 minutes of face to face patient care time was spent during this visit. All questions were encouraged and answered.  F/U in 1 month

## 2018-06-03 ENCOUNTER — Telehealth: Payer: Self-pay | Admitting: Registered Nurse

## 2018-06-03 DIAGNOSIS — G546 Phantom limb syndrome with pain: Secondary | ICD-10-CM

## 2018-06-03 DIAGNOSIS — G894 Chronic pain syndrome: Secondary | ICD-10-CM

## 2018-06-03 DIAGNOSIS — M47816 Spondylosis without myelopathy or radiculopathy, lumbar region: Secondary | ICD-10-CM

## 2018-06-03 DIAGNOSIS — Z89519 Acquired absence of unspecified leg below knee: Secondary | ICD-10-CM

## 2018-06-03 MED ORDER — FENTANYL 50 MCG/HR TD PT72: 1.0000 | MEDICATED_PATCH | TRANSDERMAL | 0 refills | Status: DC

## 2018-06-03 MED ORDER — OXYCODONE-ACETAMINOPHEN 10-325 MG PO TABS: 1.0000 | ORAL_TABLET | Freq: Four times a day (QID) | ORAL | 0 refills | Status: DC | PRN

## 2018-06-03 NOTE — Telephone Encounter (Signed)
This provider called Crystal Fitzpatrick, Crystal Fitzpatrick was admitted to [facility] yesterday, she has a possible discharge date of 06/05/2018.  We will e-scribe her Fentanyl and Oxycodone since there is a possible discharge date on 06/05/2017. Her fentanyl was last filled on 05/05/2018 and Oxycodone on 05/06/2018. He verbalizes understanding.

## 2018-06-04 MED ORDER — HEPARIN SODIUM (PORCINE) 5000 UNIT/ML IJ SOLN
5000.00 | INTRAMUSCULAR | Status: DC
Start: 2018-06-04 — End: 2018-06-04

## 2018-06-04 MED ORDER — ACETAMINOPHEN 325 MG PO TABS
650.00 | ORAL_TABLET | ORAL | Status: DC
Start: ? — End: 2018-06-04

## 2018-06-04 MED ORDER — SIMETHICONE 80 MG PO CHEW
80.00 | CHEWABLE_TABLET | ORAL | Status: DC
Start: ? — End: 2018-06-04

## 2018-06-04 MED ORDER — ONDANSETRON 4 MG PO TBDP
4.00 | ORAL_TABLET | ORAL | Status: DC
Start: ? — End: 2018-06-04

## 2018-06-04 MED ORDER — TOPIRAMATE 100 MG PO TABS
300.00 | ORAL_TABLET | ORAL | Status: DC
Start: 2018-06-04 — End: 2018-06-04

## 2018-06-04 MED ORDER — OXYCODONE-ACETAMINOPHEN 10-325 MG PO TABS
1.00 | ORAL_TABLET | ORAL | Status: DC
Start: ? — End: 2018-06-04

## 2018-06-04 MED ORDER — MELATONIN 3 MG PO TABS
3.00 | ORAL_TABLET | ORAL | Status: DC
Start: ? — End: 2018-06-04

## 2018-06-04 MED ORDER — DULOXETINE HCL 30 MG PO CPEP
60.00 | ORAL_CAPSULE | ORAL | Status: DC
Start: 2018-06-05 — End: 2018-06-04

## 2018-06-04 MED ORDER — GENERIC EXTERNAL MEDICATION
12.50 | Status: DC
Start: ? — End: 2018-06-04

## 2018-06-04 MED ORDER — GENERIC EXTERNAL MEDICATION
1.00 | Status: DC
Start: 2018-06-05 — End: 2018-06-04

## 2018-06-04 MED ORDER — GABAPENTIN 300 MG PO CAPS
600.00 | ORAL_CAPSULE | ORAL | Status: DC
Start: 2018-06-04 — End: 2018-06-04

## 2018-06-06 ENCOUNTER — Encounter: Payer: Medicare Other | Admitting: Registered Nurse

## 2018-06-20 ENCOUNTER — Other Ambulatory Visit: Payer: Self-pay | Admitting: Physical Medicine & Rehabilitation

## 2018-06-29 ENCOUNTER — Encounter: Payer: Medicare Other | Attending: Physical Medicine and Rehabilitation | Admitting: Physical Medicine & Rehabilitation

## 2018-06-29 ENCOUNTER — Encounter: Payer: Self-pay | Admitting: Physical Medicine & Rehabilitation

## 2018-06-29 DIAGNOSIS — G894 Chronic pain syndrome: Secondary | ICD-10-CM | POA: Diagnosis not present

## 2018-06-29 DIAGNOSIS — Z89519 Acquired absence of unspecified leg below knee: Secondary | ICD-10-CM | POA: Insufficient documentation

## 2018-06-29 DIAGNOSIS — G546 Phantom limb syndrome with pain: Secondary | ICD-10-CM

## 2018-06-29 DIAGNOSIS — M47816 Spondylosis without myelopathy or radiculopathy, lumbar region: Secondary | ICD-10-CM

## 2018-06-29 DIAGNOSIS — M069 Rheumatoid arthritis, unspecified: Secondary | ICD-10-CM | POA: Diagnosis present

## 2018-06-29 DIAGNOSIS — M4716 Other spondylosis with myelopathy, lumbar region: Secondary | ICD-10-CM | POA: Diagnosis present

## 2018-06-29 DIAGNOSIS — G609 Hereditary and idiopathic neuropathy, unspecified: Secondary | ICD-10-CM | POA: Diagnosis not present

## 2018-06-29 DIAGNOSIS — M179 Osteoarthritis of knee, unspecified: Secondary | ICD-10-CM | POA: Insufficient documentation

## 2018-06-29 MED ORDER — GABAPENTIN 600 MG PO TABS: 600.0000 mg | ORAL_TABLET | Freq: Four times a day (QID) | ORAL | 3 refills | Status: DC

## 2018-06-29 MED ORDER — OXYCODONE-ACETAMINOPHEN 10-325 MG PO TABS: 1.0000 | ORAL_TABLET | Freq: Four times a day (QID) | ORAL | 0 refills | Status: DC | PRN

## 2018-06-29 MED ORDER — FENTANYL 50 MCG/HR TD PT72: 1.0000 | MEDICATED_PATCH | TRANSDERMAL | 0 refills | Status: DC

## 2018-06-29 NOTE — Patient Instructions (Signed)
PLEASE FEEL FREE TO CALL OUR OFFICE WITH ANY PROBLEMS OR QUESTIONS ([phone])      

## 2018-06-29 NOTE — Progress Notes (Signed)
Subjective:    Patient ID: Crystal Fitzpatrick, female    DOB: 1946-01-24, 73 y.o.   MRN: [id number]  HPI   Mr. Chokshi is here in follow up of her chronic gait and pain issues. She has had problems with some "canker" sores as well as a cellulitis in her thigh. She was hospitalized briefly and is being followed by [facility] wound care clinic.   This past summer we reduced her fentanyl patch to 50 mcg and the patient states that since we did that, her pain levels have been increased.  Her gabapentin also this fall was decreased to 600 3 times daily.  Between the 2 the pain levels have been bothering her so much that she has felt more depressed.  She is having pain in the knee as well as the distal limbs as well as her low back.  The problems with her wound care also have not helped things.  She remains on Percocet 10/325 1 every 6 hours as needed.  She typically uses 3 to 4/day.  She is a wheelchair as her main means of mobility.  Pain Inventory Average Pain 9 Pain Right Now 8 My pain is sharp, stabbing and aching  In the last 24 hours, has pain interfered with the following? General activity 6 Relation with others 8 Enjoyment of life 6 What TIME of day is your pain at its worst? night Sleep (in general) Fair  Pain is worse with: inactivity Pain improves with: heat/ice, therapy/exercise and medication Relief from Meds: 5  Mobility use a wheelchair  Function retired  Neuro/Psych trouble walking  Prior Studies Any changes since last visit?  no  Physicians involved in your care Any changes since last visit?  no   Family History  Problem Relation Age of Onset  . Breast cancer Sister   . Diabetes Other   . Stroke Other        Grandparents   Social History   Socioeconomic History  . Marital status: Divorced    Spouse name: Not on file  . Number of children: 1  . Years of education: Not on file  . Highest education level: Not on file  Occupational History  . Occupation: retired  Oncologist: RETIRED  Social Needs  . Financial resource strain: Not on file  . Food insecurity:    Worry: Not on file    Inability: Not on file  . Transportation needs:    Medical: Not on file    Non-medical: Not on file  Tobacco Use  . Smoking status: Former Games developer  . Smokeless tobacco: Never Used  Substance and Sexual Activity  . Alcohol use: No    Alcohol/week: 0.0 standard drinks  . Drug use: No  . Sexual activity: Not on file  Lifestyle  . Physical activity:    Days per week: Not on file    Minutes per session: Not on file  . Stress: Not on file  Relationships  . Social connections:    Talks on phone: Not on file    Gets together: Not on file    Attends religious service: Not on file    Active member of club or organization: Not on file    Attends meetings of clubs or organizations: Not on file    Relationship status: Not on file  Other Topics Concern  . Not on file  Social History Narrative  . Not on file   Past Surgical History:  Procedure Laterality  Date  . AMPUTATION  02/02/2008   below right knee  . foot surgury  1980 and 1981  . NASAL SEPTUM SURGERY    . 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 s/p Breast biopsy  1992  . s/p EGD  2007   with Botox for? Achalasia  . TONSILLECTOMY    . TUBAL LIGATION     Past Medical History:  Diagnosis Date  . ABDOMINAL DISTENSION 10/11/2008  . Acute gouty arthropathy 12/11/2008  . Altered mental status 02/07/2010  . AMPUTATION, BELOW KNEE, RIGHT, HX OF 01/28/2009  . ANAPHYLACTIC SHOCK 05/15/2009  . ANEMIA-NOS 07/17/2008  . ANXIETY 07/17/2008  . ARTHRITIS 01/28/2009  . B12 DEFICIENCY 07/17/2008  . Cellulitis and abscess of leg, except foot 11/15/2008  . CHF 01/28/2009  . Chronic pain syndrome 02/14/2010  . COLONIC POLYPS, HX OF 07/17/2008  . DIABETES MELLITUS, TYPE II 07/17/2008  . Dysuria 11/11/2009  . FOOT PAIN 07/17/2008  . GERD 07/17/2008  . HEARING LOSS, RIGHT EAR 06/04/2009  .  HYPERKALEMIA 10/31/2009  . Hyperlipemia 09/24/2010  . Hyperlipidemia 09/24/2010  . HYPERSOMNIA 02/14/2010  . HYPERTENSION 07/17/2008  . HYPONATREMIA 03/20/2010  . Hypoxemia 03/20/2010  . IBS 06/04/2009  . MENOPAUSAL DISORDER 12/03/2009  . NEUROPATHY, HX OF 01/28/2009  . Osteoporosis 11/16/2014  . PERIPHERAL EDEMA 08/30/2008  . Pneumonia, aspiration (HCC) 12/01/2011   Episode July 2013, [facility]  . Pneumonia, organism unspecified(486) 02/14/2010  . RENAL INSUFFICIENCY 10/31/2009  . Rheumatoid arthritis(714.0) 07/17/2008  . Seizure (HCC) 10/26/2010  . SKIN LESION 06/04/2009  . SKIN ULCER, CHRONIC 02/08/2009  . Spinal stenosis of lumbar region 05/15/2015  . Trigeminal neuralgia 02/14/2010  . UNSPECIFIED HEPATITIS 01/28/2009  . Wheezing 12/03/2009   BP (!) 155/89   Pulse 79   Ht 5\' 10"  (1.778 m)   Wt 215 lb (97.5 kg)   SpO2 95%   BMI 30.85 kg/m   Opioid Risk Score:   Fall Risk Score:  `1  Depression screen PHQ 2/9  [facility] 04/07/2018 03/11/2018 01/05/2018 12/08/2017 08/19/2017 06/24/2017 05/26/2017  Decreased Interest 1 1 1 1 1  0 1  Down, Depressed, Hopeless 1 1 1 1 1  0 1  PHQ - 2 Score 2 2 2 2 2  0 2  Altered sleeping - - - - 0 - -  Tired, decreased energy - - - - 1 - -  Change in appetite - - - - 1 - -  Feeling bad or failure about yourself  - - - - 1 - -  Trouble concentrating - - - - 0 - -  Moving slowly or fidgety/restless - - - - 0 - -  Suicidal thoughts - - - - 0 - -  PHQ-9 Score - - - - 5 - -  Difficult doing work/chores - - - - - - -  Some recent data might be hidden     Review of Systems  Constitutional: Negative.   HENT: Negative.   Eyes: Negative.   Respiratory: Negative.   Cardiovascular: Negative.   Gastrointestinal: Negative.   Endocrine: Negative.   Genitourinary: Negative.   Musculoskeletal: Positive for arthralgias, back pain, gait problem and myalgias.  Skin: Negative.   Allergic/Immunologic: Negative.   Hematological:  Negative.   Psychiatric/Behavioral: Negative.   All other systems reviewed and are negative.      Objective:   Physical Exam General: No acute distress HEENT: EOMI, oral membranes moist Cards: reg  rate  Chest: normal effort Abdomen: Soft, NT, ND Skin: dry, intact Extremities: 2-3+ edema left thigh.  Left leg wrapped in no obvious edema present  Neurological: Alert and oriented x3.  Decreased sensation to light touch in both lower extremities UE's: 4/5 deltoids, biceps,triceps 4/5. LLE: 4/5 HF, 3+KE 2/5 ADF/PF. Cognitively she's intact Skin: compressive dressing left leg, thigh not visualized.  Psychiatric: Pleasant.     Assessment & Plan:  1.Functional deficits secondary to right below-knee amputation, history of phantom limb pain.:  -consider powered chair or scooter at some point             2. Peripheral neuropathy: increase Gabapentin back to 600mg  QID.  ContinueFentanyl Patch: continue at dose for now. #10. Continue percocet 10/325 q6 prn #120.  - again spent EXTENSIVE time regarding her opioid regimen and other means of managing her pain asside from opioids -We will continue the controlled substance monitoring program, this consists of regular clinic visits, examinations, routine drug screening, pill counts as well as use of [place] Controlled Substance Reporting System. [place].   -Provided her information on the concept of opioid hypersensitivity -Reviewed the fact that we could explore some interventional options for her back and knee pain if she is clear from an infectious disease and wound care standpoint. -Offered neuropsychological counseling for coping skills and pain management skills -Discussed general safety and efficacy of opioid treatment. 3. History of rheumatoid arthritis. Continue Current Medication and Exercise and heat Therapy.  4. Osteoarthritis of left knee: Continue with Voltaren gel/ Exercise and heat  therapy.  5. Chronic cellulitis. left legwoundwith associated edema/buttock wound: -continue [facility] wound care clinic mgt             -slow healing wounds.  6. Lumbar Spondylosis: continue HEP. 7. Herpes Zoster outbreak: -resume gabapentin  600mg  QID   - lidoderm patches   of face to face patient care time was spent during this visit. All questions were encouraged and answered. NP to see her in follow up in about 24month

## 2018-07-06 ENCOUNTER — Ambulatory Visit: Payer: Medicare Other | Admitting: Physical Medicine & Rehabilitation

## 2018-07-27 ENCOUNTER — Encounter: Payer: Medicare Other | Attending: Physical Medicine and Rehabilitation | Admitting: Registered Nurse

## 2018-07-27 ENCOUNTER — Encounter: Payer: Self-pay | Admitting: Registered Nurse

## 2018-07-27 ENCOUNTER — Other Ambulatory Visit: Payer: Self-pay

## 2018-07-27 VITALS — BP 160/89 | HR 85

## 2018-07-27 DIAGNOSIS — M4716 Other spondylosis with myelopathy, lumbar region: Secondary | ICD-10-CM | POA: Insufficient documentation

## 2018-07-27 DIAGNOSIS — M25511 Pain in right shoulder: Secondary | ICD-10-CM

## 2018-07-27 DIAGNOSIS — G894 Chronic pain syndrome: Secondary | ICD-10-CM

## 2018-07-27 DIAGNOSIS — M62838 Other muscle spasm: Secondary | ICD-10-CM

## 2018-07-27 DIAGNOSIS — Z79891 Long term (current) use of opiate analgesic: Secondary | ICD-10-CM

## 2018-07-27 DIAGNOSIS — G609 Hereditary and idiopathic neuropathy, unspecified: Secondary | ICD-10-CM | POA: Insufficient documentation

## 2018-07-27 DIAGNOSIS — M47816 Spondylosis without myelopathy or radiculopathy, lumbar region: Secondary | ICD-10-CM

## 2018-07-27 DIAGNOSIS — G8929 Other chronic pain: Secondary | ICD-10-CM

## 2018-07-27 DIAGNOSIS — M546 Pain in thoracic spine: Secondary | ICD-10-CM

## 2018-07-27 DIAGNOSIS — M069 Rheumatoid arthritis, unspecified: Secondary | ICD-10-CM | POA: Diagnosis present

## 2018-07-27 DIAGNOSIS — Z5181 Encounter for therapeutic drug level monitoring: Secondary | ICD-10-CM

## 2018-07-27 DIAGNOSIS — Z89519 Acquired absence of unspecified leg below knee: Secondary | ICD-10-CM | POA: Diagnosis present

## 2018-07-27 DIAGNOSIS — G546 Phantom limb syndrome with pain: Secondary | ICD-10-CM

## 2018-07-27 DIAGNOSIS — M179 Osteoarthritis of knee, unspecified: Secondary | ICD-10-CM | POA: Diagnosis present

## 2018-07-27 MED ORDER — OXYCODONE-ACETAMINOPHEN 10-325 MG PO TABS: 1.0000 | ORAL_TABLET | Freq: Four times a day (QID) | ORAL | 0 refills | Status: DC | PRN

## 2018-07-27 MED ORDER — FENTANYL 50 MCG/HR TD PT72: 1.0000 | MEDICATED_PATCH | TRANSDERMAL | 0 refills | Status: DC

## 2018-07-27 MED ORDER — DULOXETINE HCL 60 MG PO CPEP: 120.0000 mg | ORAL_CAPSULE | Freq: Every day | ORAL | 2 refills | Status: DC

## 2018-07-27 NOTE — Progress Notes (Signed)
Subjective:    Patient ID: Crystal Fitzpatrick, female    DOB: 12/09/1945, 73 y.o.   MRN: [id number]  HPI: Crystal Fitzpatrick is a [age] y.o. female who returns for follow up appointment for chronic pain and medication refill. She states her pain is located in her right shoulder, mid- lower back, left knee and buttock pain.. She rates her pain 7. Her current exercise regime is  Performing Yoga stretching exercises daily.   Crystal Fitzpatrick arrived today, when she approached front desk she asked  Microbiologist( Lisa manager) for scissors stating "she forgot to put on her Fentanyl patch", she was given a pair of scissor and she applied her Fentanyl patch  prior to this provider entering the room. .At the end of her visit she asked if this provider would prescribe the medication she takes 3 tablets at one time, she could not re call the name of the medication. Medication list was reviewed. Crystal Fitzpatrick was referring to her Topamax, according to Epic the Topamax was ordered last on 02/14/2018, this provider placed a call to [facility], it was last filled on 06/07/2018, 05/10/2018 and 03/26/2018. I asked Crystal Fitzpatrick how does she take her gabapentin, she stated " I only take it occasionally when the pain is severe, it is prescribed 4 times a day, she stated again I only take it as needed. I asked Crystal Fitzpatrick if she could call office with how many tablets of Topamax she has, she agreed.  Explain to Crystal Fitzpatrick in detail regarding safety with her medications, and we discussed her age, she verbalizes understanding. I reviewed the above with her several times and when she was checking out, she asked about the above again, and stated "she only takes her gabapentin occasionally, Crystal Fitzpatrick front desk co-worker also heard Ms. Charity statement.   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 Also has a stage 2 pressure  ulcer of right buttock.  Note was reviewed.  Crystal Fitzpatrick Morphine Equivalent is 180.00MME. Last UDS was Performed on 11/04/2017, it was consistent. Oral Swab was Performed today.   Pain Inventory Average Pain 7 Pain Right Now 7 My pain is sharp, burning, stabbing and aching  In the last 24 hours, has pain interfered with the following? General activity 4 Relation with others 5 Enjoyment of life 7 What TIME of day is your pain at its worst? morning and night Sleep (in general) Poor  Pain is worse with: walking, sitting and inactivity Pain improves with: heat/ice, therapy/exercise and medication Relief from Meds: 6  Mobility use a wheelchair needs help with transfers transfers alone  Function retired  Neuro/Psych No problems in this area  Prior Studies Any changes since last visit?  no  Physicians involved in your care Any changes since last visit?  no   Family History  Problem Relation Age of Onset  . Breast cancer Sister   . Diabetes Other   . Stroke Other        Grandparents   Social History   Socioeconomic History  . Marital status: Divorced    Spouse name: Not on file  . Number of children: 1  . Years of education: Not on file  . Highest education level: Not on file  Occupational History  . Occupation: retired Oncologistmergency response coordinator    Employer: RETIRED  Social Needs  . Financial resource strain: Not on file  . Food insecurity:    Worry: Not on file    Inability: Not on file  . Transportation needs:    Medical: Not on file    Non-medical: Not on file  Tobacco Use  . Smoking status: Former Games developer  . Smokeless tobacco: Never Used  Substance and Sexual Activity  . Alcohol use: No    Alcohol/week: 0.0 standard drinks  . Drug use: No  . Sexual activity: Not on file  Lifestyle  . Physical activity:    Days per week: Not on file    Minutes per session: Not on file  . Stress: Not on file  Relationships  . Social connections:    Talks on  phone: Not on file    Gets together: Not on file    Attends religious service: Not on file    Active member of club or organization: Not on file    Attends meetings of clubs or organizations: Not on file    Relationship status: Not on file  Other Topics Concern  . Not on file  Social History Narrative  . Not on file   Past Surgical History:  Procedure Laterality Date  . AMPUTATION  02/02/2008   below right knee  . foot surgury  1980 and 1981  . NASAL SEPTUM SURGERY    . 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 s/p Breast biopsy  1992  . s/p EGD  2007   with Botox for? Achalasia  . TONSILLECTOMY    . TUBAL LIGATION     Past Medical History:  Diagnosis Date  . ABDOMINAL DISTENSION 10/11/2008  . Acute gouty arthropathy 12/11/2008  . Altered mental status 02/07/2010  . AMPUTATION, BELOW KNEE, RIGHT, HX OF 01/28/2009  . ANAPHYLACTIC SHOCK 05/15/2009  . ANEMIA-NOS 07/17/2008  . ANXIETY 07/17/2008  . ARTHRITIS 01/28/2009  . B12 DEFICIENCY 07/17/2008  . Cellulitis and abscess of leg, except foot 11/15/2008  . CHF 01/28/2009  . Chronic pain syndrome 02/14/2010  . COLONIC POLYPS, HX OF 07/17/2008  . DIABETES MELLITUS, TYPE II 07/17/2008  . Dysuria 11/11/2009  . FOOT PAIN 07/17/2008  . GERD 07/17/2008  . HEARING LOSS, RIGHT EAR 06/04/2009  . HYPERKALEMIA 10/31/2009  . Hyperlipemia 09/24/2010  . Hyperlipidemia 09/24/2010  . HYPERSOMNIA 02/14/2010  . HYPERTENSION 07/17/2008  . HYPONATREMIA 03/20/2010  . Hypoxemia 03/20/2010  . IBS 06/04/2009  . MENOPAUSAL DISORDER 12/03/2009  . NEUROPATHY, HX OF 01/28/2009  . Osteoporosis 11/16/2014  . PERIPHERAL EDEMA 08/30/2008  . Pneumonia, aspiration (HCC) 12/01/2011   Episode July 2013, tx per [facility]  . Pneumonia, organism unspecified(486) 02/14/2010  . RENAL INSUFFICIENCY 10/31/2009  . Rheumatoid arthritis(714.0) 07/17/2008  . Seizure (HCC) 10/26/2010  . SKIN LESION 06/04/2009  . SKIN ULCER, CHRONIC 02/08/2009  . Spinal stenosis of lumbar region  05/15/2015  . Trigeminal neuralgia 02/14/2010  . UNSPECIFIED HEPATITIS 01/28/2009  . Wheezing 12/03/2009   BP (!) 160/89   Pulse 85   SpO2 94%   Opioid Risk Score:   Fall Risk Score:  `1  Depression screen PHQ 2/9  Depression screen [facility] 2/9 07/27/2018 04/07/2018 03/11/2018 01/05/2018 12/08/2017 08/19/2017 06/24/2017  Decreased Interest 1 1 1 1 1 1  0  Down, Depressed, Hopeless 1 1 1 1 1 1  0  PHQ - 2 Score 2 2 2 2 2 2  0  Altered sleeping - - - - - 0 -  Tired, decreased energy - - - - - 1 -  Change in appetite - - - - - 1 -  Feeling bad or failure about yourself  - - - - - 1 -  Trouble concentrating - - - - - 0 -  Moving slowly or fidgety/restless - - - - - 0 -  Suicidal thoughts - - - - - 0 -  PHQ-9 Score - - - - - 5 -  Difficult doing work/chores - - - - - - -  Some recent data might be hidden    Review of Systems  Constitutional: Negative.   HENT: Negative.   Eyes: Negative.   Respiratory: Negative.   Cardiovascular: Negative.   Gastrointestinal: Negative.   Endocrine: Negative.   Genitourinary: Negative.   Musculoskeletal: Negative.   Skin: Positive for wound.  Allergic/Immunologic: Negative.   Neurological: Negative.   Hematological: Negative.   Psychiatric/Behavioral: Negative.   All other systems reviewed and are negative.      Objective:   Physical Exam Vitals signs and nursing note reviewed.  Constitutional:      Appearance: Normal appearance.  Neck:     Musculoskeletal: Normal range of motion and neck supple.  Cardiovascular:     Rate and Rhythm: Normal rate and regular rhythm.     Pulses: Normal pulses.     Heart sounds: Normal heart sounds.  Pulmonary:     Effort: Pulmonary effort is normal.     Breath sounds: Normal breath sounds.  Musculoskeletal:     Comments: Normal Muscle Bulk and Muscle Testing Reveals:  Upper Extremities: Full ROM and Muscle Strength 5/5 Thoracic Paraspinal Tenderness: T-2-T-3 T-7-T-9,  Lower Extremities: Right: BKA Left:  Decreased ROM and Muscle Strength 5/5  Left Lower extremity with Coban Wrap dressing Intact Arrived in wheelchair  Skin:    General: Skin is warm and dry.  Neurological:     Mental Status: She is alert and oriented to person, place, and time.  Psychiatric:        Mood and Affect: Mood normal.        Behavior: Behavior normal.           Assessment & Plan:  1.Right below-knee amputation, history of phantom limb pain.: ContinuewithCurrent Medication Regime and Continue to Monitor. 03/11//2020 2. Peripheral neuropathy: Continuewith current treatment regimen:Gabapentin, Topamaxand Cymbalta. 07/27/2018 3. Chronic Pain Syndrome:Continue Current Treatment Regimen: Refilled: Fenatnyl Patch6250mcg one patch every three days #10 and Continue Oxycodone 10/325mg  one tablet every 6 hours as needed #120.07/27/2018 We will continue the opioid monitoring program, this consists of regular clinic visits, examinations, urine drug screen, pill counts as well as use of [place] Controlled Substance Reporting System. 4. History of rheumatoid arthritis. Continue Current Medication and Exercise and heat Therapy.03/11//2020 5. Osteoarthritis of left knee:Continuecurrent treatment regimen with:Voltaren gel/ Exercise and heat therapy. 07/27/2018 6. Chronic cellulitis, left legulcerwith associated edema:Stage 2 pressure Ulcer: Wound Care Following:Attending [facility] weekly andFamily Friendchanging dressing. 07/27/2018 7. Lumbar Spondylosis: Continue current medication regime. Continue HEPas tolerated. Continue to Monitor.07/27/2018 8. Chronic Right Shoulder Pain :Continue HEP as Tolerated.03/11//2020 9. Muscle Spasm:Continue current treatment regimen withFlexeril.07/27/2018 10. 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 Post Herpetic Neuralgia: Continue Gabapentin.07/27/2018. 12. Chronic thoracic Back  Pain: Continue HEP as Tolerated. Continue current medication regimen. 07/27/2018.  30 minutes of face to face patient care time was spent during this visit. All questions were encouraged and answered.  F/U in 1 month

## 2018-07-28 ENCOUNTER — Encounter: Payer: Medicare Other | Admitting: Registered Nurse

## 2018-07-28 ENCOUNTER — Telehealth: Payer: Self-pay | Admitting: Physical Medicine & Rehabilitation

## 2018-07-28 MED ORDER — TOPIRAMATE 100 MG PO TABS: ORAL_TABLET | ORAL | 0 refills | Status: DC

## 2018-07-28 MED ORDER — GABAPENTIN 600 MG PO TABS: 600.0000 mg | ORAL_TABLET | Freq: Two times a day (BID) | ORAL | 0 refills | Status: DC

## 2018-07-28 NOTE — Telephone Encounter (Signed)
This provider returned Crystal Fitzpatrick call, she reports she has 6 tablets of Topamax. Spoke with Crystal Fitzpatrick regarding her refills of Topamax last fill date was 06/07/2018, 05/10/2018 and 03/26/2018. With the refill dates listed above it looks like she's not  taking her Topamax nightly, asked Crystal Fitzpatrick regarding the above she states she forgets at times. She also asked if I could refill her Topamax and Gabapentin, yesterday Crystal Fitzpatrick reported she only takes her gabapentin occasionally, her last fill of Gabapentin was on 06/07/2018, per [facility]. Gabapentin was discontinued and Crystal Fitzpatrick became argumentative and accused this provider of being punitive. Tried to explain to Crystal Fitzpatrick what she stated yesterday,in regards to her Gabapentin she states she did not say she takes her gabapentin occasionally, [name] co-worker heard Crystal Fitzpatrick state the above. Today she states she is taking her gabapentin 3 times a day, 120 tablets of gabapentin was dispensed on 06/07/2018, if she was taking the gabapentin three times a day  her current prescription would have lasted 40 days and she would have been out of her gabapentin on 07/16/2018, she reports she has a few tablets of gabapentin. Tried to explain to Crystal Fitzpatrick in detail  regarding safety and responsibility with her current medication regimen , and voiced concerns regarding her safety, we discussed the event which occurred in July 2019, which caused her Fentanyl to be decreased, she states she remembers the occurrence.  Crystal Fitzpatrick states she doesn't want to see this provider anymore, she wanted to speak with Dr. Riley Kill, explain to Crystal Fitzpatrick Dr. Riley Kill wasn't at the office at this time and her April appointment will be changed to Dr. Riley Kill per her request, she verbalizes understanding.  Her Gabapentin and Topamax was decreased today. [name]

## 2018-07-28 NOTE — Telephone Encounter (Signed)
Patient called Crystal Fitzpatrick to let her know that she has 6 pills left.  She didn't mention which medication it was only the count on the voicemail.

## 2018-07-29 ENCOUNTER — Telehealth: Payer: Self-pay | Admitting: Physical Medicine & Rehabilitation

## 2018-07-29 NOTE — Telephone Encounter (Signed)
Called Crystal Fitzpatrick and advised take meds:  Gabapentin 3  Day Topamax as written 2 at bed time see Dr. Riley Kill in April 22

## 2018-07-29 NOTE — Telephone Encounter (Signed)
May need to reduce topamax further. I think we need to continue gabapentin, but I think topamax may need to be weaned off.

## 2018-08-02 LAB — DRUG TOX MONITOR 1 W/CONF, ORAL FLD
Amphetamines: NEGATIVE ng/mL (ref <10)
Barbiturates: NEGATIVE ng/mL (ref <10)
Benzodiazepines: NEGATIVE ng/mL (ref <0.50)
Buprenorphine: NEGATIVE ng/mL (ref <0.10)
CODEINE: NEGATIVE ng/mL (ref <2.5)
Cocaine: NEGATIVE ng/mL (ref <5.0)
Dihydrocodeine: NEGATIVE ng/mL (ref <2.5)
Fentanyl: 1.6 ng/mL — ABNORMAL HIGH (ref <0.10)
Fentanyl: POSITIVE ng/mL — AB (ref <0.10)
Heroin Metabolite: NEGATIVE ng/mL (ref <1.0)
Hydrocodone: NEGATIVE ng/mL (ref <2.5)
Hydromorphone: NEGATIVE ng/mL (ref <2.5)
MARIJUANA: NEGATIVE ng/mL (ref <2.5)
MDMA: NEGATIVE ng/mL (ref <10)
Meprobamate: NEGATIVE ng/mL (ref <2.5)
Methadone: NEGATIVE ng/mL (ref <5.0)
Morphine: NEGATIVE ng/mL (ref <2.5)
Nicotine Metabolite: NEGATIVE ng/mL (ref <5.0)
Norhydrocodone: NEGATIVE ng/mL (ref <2.5)
Noroxycodone: 23.5 ng/mL — ABNORMAL HIGH (ref <2.5)
Opiates: POSITIVE ng/mL — AB (ref <2.5)
Oxycodone: 57.7 ng/mL — ABNORMAL HIGH (ref <2.5)
Oxymorphone: NEGATIVE ng/mL (ref <2.5)
Phencyclidine: NEGATIVE ng/mL (ref <10)
TAPENTADOL: NEGATIVE ng/mL (ref <5.0)
Tramadol: NEGATIVE ng/mL (ref <5.0)
Zolpidem: NEGATIVE ng/mL (ref <5.0)

## 2018-08-02 LAB — DRUG TOX ALC METAB W/CON, ORAL FLD

## 2018-08-03 ENCOUNTER — Telehealth: Payer: Self-pay | Admitting: *Deleted

## 2018-08-03 NOTE — Telephone Encounter (Signed)
Oral swab drug screen was consistent for prescribed medications.  ?

## 2018-08-24 ENCOUNTER — Other Ambulatory Visit: Payer: Self-pay | Admitting: Registered Nurse

## 2018-08-25 ENCOUNTER — Telehealth: Payer: Self-pay | Admitting: Registered Nurse

## 2018-08-25 ENCOUNTER — Other Ambulatory Visit: Payer: Self-pay | Admitting: Registered Nurse

## 2018-08-25 ENCOUNTER — Ambulatory Visit: Payer: Medicare Other | Admitting: Registered Nurse

## 2018-08-25 ENCOUNTER — Telehealth: Payer: Self-pay

## 2018-08-25 DIAGNOSIS — G609 Hereditary and idiopathic neuropathy, unspecified: Secondary | ICD-10-CM

## 2018-08-25 DIAGNOSIS — M4716 Other spondylosis with myelopathy, lumbar region: Secondary | ICD-10-CM

## 2018-08-25 DIAGNOSIS — G546 Phantom limb syndrome with pain: Secondary | ICD-10-CM

## 2018-08-25 DIAGNOSIS — M47816 Spondylosis without myelopathy or radiculopathy, lumbar region: Secondary | ICD-10-CM

## 2018-08-25 DIAGNOSIS — Z89519 Acquired absence of unspecified leg below knee: Secondary | ICD-10-CM

## 2018-08-25 DIAGNOSIS — G894 Chronic pain syndrome: Secondary | ICD-10-CM

## 2018-08-25 MED ORDER — FENTANYL 50 MCG/HR TD PT72: 1.0000 | MEDICATED_PATCH | TRANSDERMAL | 0 refills | Status: DC

## 2018-08-25 MED ORDER — GABAPENTIN 600 MG PO TABS: 600.0000 mg | ORAL_TABLET | Freq: Two times a day (BID) | ORAL | 0 refills | Status: DC

## 2018-08-25 MED ORDER — OXYCODONE-ACETAMINOPHEN 10-325 MG PO TABS: 1.0000 | ORAL_TABLET | Freq: Four times a day (QID) | ORAL | 0 refills | Status: DC | PRN

## 2018-08-25 NOTE — Telephone Encounter (Signed)
Patient called requesting refill on Fentanyl and Oxycodone/APAP. Last filled #10 of Fentanyl and #120 of Oxycodone/APAP on 3.14.2020. Patient next appt 09/07/2018.

## 2018-08-25 NOTE — Telephone Encounter (Signed)
Gabapentin order sent to pharmacy.

## 2018-08-25 NOTE — Telephone Encounter (Signed)
Phoned ptn and advised meds called into pharmacy - early but will be there when time correct.

## 2018-08-25 NOTE — Telephone Encounter (Signed)
PMP was reviewed : Fentanyl and Oxycodone was e-scribed.

## 2018-09-07 ENCOUNTER — Encounter: Payer: Self-pay | Admitting: Physical Medicine & Rehabilitation

## 2018-09-07 ENCOUNTER — Encounter: Payer: Medicare Other | Attending: Physical Medicine and Rehabilitation | Admitting: Physical Medicine & Rehabilitation

## 2018-09-07 ENCOUNTER — Other Ambulatory Visit: Payer: Self-pay

## 2018-09-07 DIAGNOSIS — G546 Phantom limb syndrome with pain: Secondary | ICD-10-CM

## 2018-09-07 DIAGNOSIS — G609 Hereditary and idiopathic neuropathy, unspecified: Secondary | ICD-10-CM | POA: Diagnosis not present

## 2018-09-07 DIAGNOSIS — Z89519 Acquired absence of unspecified leg below knee: Secondary | ICD-10-CM | POA: Diagnosis not present

## 2018-09-07 DIAGNOSIS — M47816 Spondylosis without myelopathy or radiculopathy, lumbar region: Secondary | ICD-10-CM

## 2018-09-07 DIAGNOSIS — M4716 Other spondylosis with myelopathy, lumbar region: Secondary | ICD-10-CM | POA: Insufficient documentation

## 2018-09-07 DIAGNOSIS — M069 Rheumatoid arthritis, unspecified: Secondary | ICD-10-CM | POA: Insufficient documentation

## 2018-09-07 DIAGNOSIS — M179 Osteoarthritis of knee, unspecified: Secondary | ICD-10-CM | POA: Insufficient documentation

## 2018-09-07 DIAGNOSIS — G894 Chronic pain syndrome: Secondary | ICD-10-CM

## 2018-09-07 MED ORDER — OXYCODONE-ACETAMINOPHEN 10-325 MG PO TABS: 1.0000 | ORAL_TABLET | Freq: Four times a day (QID) | ORAL | 0 refills | Status: DC | PRN

## 2018-09-07 MED ORDER — FENTANYL 50 MCG/HR TD PT72: 1.0000 | MEDICATED_PATCH | TRANSDERMAL | 0 refills | Status: DC

## 2018-09-07 MED ORDER — GABAPENTIN 600 MG PO TABS: 600.0000 mg | ORAL_TABLET | Freq: Two times a day (BID) | ORAL | 0 refills | Status: DC

## 2018-09-07 NOTE — Progress Notes (Signed)
Subjective:    Patient ID: Crystal BrownerMarilyn Fitzpatrick, female    DOB: 01/09/1946, 73 y.o.   MRN: [id number]  HPI   Due to national recommendations of social distancing because of COVID 4119, an audio/video tele-health visit is felt to be the most appropriate encounter for this patient at this time. See MyChart message from today for the patient's consent to a tele-health encounter with [facility]. This is a follow up telephone visit for the patient who is at home. MD is at office.    I am meeting with the patient today regarding her chronic pain and mobility issues. Her wounds are an ongoing issues. She developed another cellulitis for which she's been on abx. She still has a low grade temp. She was placed on keflex 500mg  bid for 5 days.     Pain Inventory Average Pain 8 Pain Right Now 8 My pain is sharp, burning and aching  In the last 24 hours, has pain interfered with the following? General activity 8 Relation with others 8 Enjoyment of life 8 What TIME of day is your pain at its worst? all Sleep (in general) Poor  Pain is worse with: sitting and inactivity Pain improves with: medication Relief from Meds: 7  Mobility use a wheelchair transfers alone  Function disabled: date disabled na I need assistance with the following:  meal prep, household duties and shopping  Neuro/Psych weakness numbness tremor  Prior Studies Any changes since last visit?  no  Physicians involved in your care Any changes since last visit?  no   Family History  Problem Relation Age of Onset  . Breast cancer Sister   . Diabetes Other   . Stroke Other        Grandparents   Social History   Socioeconomic History  . Marital status: Divorced    Spouse name: Not on file  . Number of children: 1  . Years of education: Not on file  . Highest education level: Not on file  Occupational History  . Occupation: retired Oncologistmergency response coordinator    Employer:  RETIRED  Social Needs  . Financial resource strain: Not on file  . Food insecurity:    Worry: Not on file    Inability: Not on file  . Transportation needs:    Medical: Not on file    Non-medical: Not on file  Tobacco Use  . Smoking status: Former Games developermoker  . Smokeless tobacco: Never Used  Substance and Sexual Activity  . Alcohol use: No    Alcohol/week: 0.0 standard drinks  . Drug use: No  . Sexual activity: Not on file  Lifestyle  . Physical activity:    Days per week: Not on file    Minutes per session: Not on file  . Stress: Not on file  Relationships  . Social connections:    Talks on phone: Not on file    Gets together: Not on file    Attends religious service: Not on file    Active member of club or organization: Not on file    Attends meetings of clubs or organizations: Not on file    Relationship status: Not on file  Other Topics Concern  . Not on file  Social History Narrative  . Not on file   Past Surgical History:  Procedure Laterality Date  . AMPUTATION  02/02/2008   below right knee  . foot surgury  1980 and 1981  . NASAL SEPTUM SURGERY    .  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 s/p Breast biopsy  1992  . s/p EGD  2007   with Botox for? Achalasia  . TONSILLECTOMY    . TUBAL LIGATION     Past Medical History:  Diagnosis Date  . ABDOMINAL DISTENSION 10/11/2008  . Acute gouty arthropathy 12/11/2008  . Altered mental status 02/07/2010  . AMPUTATION, BELOW KNEE, RIGHT, HX OF 01/28/2009  . ANAPHYLACTIC SHOCK 05/15/2009  . ANEMIA-NOS 07/17/2008  . ANXIETY 07/17/2008  . ARTHRITIS 01/28/2009  . B12 DEFICIENCY 07/17/2008  . Cellulitis and abscess of leg, except foot 11/15/2008  . CHF 01/28/2009  . Chronic pain syndrome 02/14/2010  . COLONIC POLYPS, HX OF 07/17/2008  . DIABETES MELLITUS, TYPE II 07/17/2008  . Dysuria 11/11/2009  . FOOT PAIN 07/17/2008  . GERD 07/17/2008  . HEARING LOSS, RIGHT EAR 06/04/2009  . HYPERKALEMIA 10/31/2009  . Hyperlipemia 09/24/2010  .  Hyperlipidemia 09/24/2010  . HYPERSOMNIA 02/14/2010  . HYPERTENSION 07/17/2008  . HYPONATREMIA 03/20/2010  . Hypoxemia 03/20/2010  . IBS 06/04/2009  . MENOPAUSAL DISORDER 12/03/2009  . NEUROPATHY, HX OF 01/28/2009  . Osteoporosis 11/16/2014  . PERIPHERAL EDEMA 08/30/2008  . Pneumonia, aspiration (HCC) 12/01/2011   Episode July 2013, tx per [facility]  . Pneumonia, organism unspecified(486) 02/14/2010  . RENAL INSUFFICIENCY 10/31/2009  . Rheumatoid arthritis(714.0) 07/17/2008  . Seizure (HCC) 10/26/2010  . SKIN LESION 06/04/2009  . SKIN ULCER, CHRONIC 02/08/2009  . Spinal stenosis of lumbar region 05/15/2015  . Trigeminal neuralgia 02/14/2010  . UNSPECIFIED HEPATITIS 01/28/2009  . Wheezing 12/03/2009   There were no vitals taken for this visit.  Opioid Risk Score:   Fall Risk Score:  `1  Depression screen PHQ 2/9  Depression screen [facility] 2/9 07/27/2018 04/07/2018 03/11/2018 01/05/2018 12/08/2017 08/19/2017 06/24/2017  Decreased Interest 1 1 1 1 1 1  0  Down, Depressed, Hopeless 1 1 1 1 1 1  0  PHQ - 2 Score 2 2 2 2 2 2  0  Altered sleeping - - - - - 0 -  Tired, decreased energy - - - - - 1 -  Change in appetite - - - - - 1 -  Feeling bad or failure about yourself  - - - - - 1 -  Trouble concentrating - - - - - 0 -  Moving slowly or fidgety/restless - - - - - 0 -  Suicidal thoughts - - - - - 0 -  PHQ-9 Score - - - - - 5 -  Difficult doing work/chores - - - - - - -  Some recent data might be hidden     Review of Systems  Constitutional: Negative.   HENT: Negative.   Eyes: Negative.   Respiratory: Positive for cough and shortness of breath.   Cardiovascular: Negative.   Gastrointestinal: Positive for diarrhea and nausea.  Endocrine: Negative.   Genitourinary: Negative.   Musculoskeletal: Positive for arthralgias and back pain.       Right shoulder Right thumb Foot pain Knee pain  Skin: Positive for wound.  Allergic/Immunologic: Negative.   Neurological: Negative.    Hematological: Negative.   Psychiatric/Behavioral: Negative.   All other systems reviewed and are negative.          Assessment & Plan:  1.Functional deficits secondary to right below-knee amputation, history of phantom limb pain.:  -consider powered chair or scooter at some point   2. Peripheral neuropathy: increase Gabapentin back again to 600mg  TID.  ContinueFentanyl Patch: continue at 50mcg dose for now. #10. Continue percocet 10/325 q6 prn #120.  -filled for 09/22/2018 -We will continue the controlled substance monitoring program, this consists of regular clinic visits, examinations, routine drug screening, pill counts as well as use of [place] Controlled Sub[place]. [place].  -Reviewed the fact that we could explore some interventional options for her back and knee pain if she is clear from an infectious disease and wound care standpoint. -Discussed general safety and efficacy of opioid treatment. 3. History of rheumatoid arthritis. Continue Current Medication and Exercise and heat Therapy.  4. Osteoarthritis of left knee: Continue with Voltaren gel/ Exercise and heat therapy.  5. Chronic cellulitis. left legwoundwith associated edema/buttock wound: -continue [facility] wound care clinic mgt -needs to follow up of re: her wound and low grade temp .  6. Lumbar Spondylosis: continue HEP. 7. Herpes Zoster outbreak: -resumed gabapentin 600mg  TID   - lidoderm patches   15 minutes of tele-visit time was spent with this patient today. Follow up with ME in 2 months

## 2018-09-21 ENCOUNTER — Other Ambulatory Visit: Payer: Self-pay

## 2018-09-22 ENCOUNTER — Other Ambulatory Visit: Payer: Self-pay | Admitting: Registered Nurse

## 2018-10-06 ENCOUNTER — Other Ambulatory Visit: Payer: Self-pay | Admitting: Family Medicine

## 2018-10-13 ENCOUNTER — Other Ambulatory Visit: Payer: Self-pay

## 2018-10-20 ENCOUNTER — Other Ambulatory Visit: Payer: Self-pay | Admitting: Physical Medicine & Rehabilitation

## 2018-10-20 DIAGNOSIS — Z89519 Acquired absence of unspecified leg below knee: Secondary | ICD-10-CM

## 2018-10-20 DIAGNOSIS — G546 Phantom limb syndrome with pain: Secondary | ICD-10-CM

## 2018-10-20 DIAGNOSIS — M47816 Spondylosis without myelopathy or radiculopathy, lumbar region: Secondary | ICD-10-CM

## 2018-10-20 DIAGNOSIS — M4716 Other spondylosis with myelopathy, lumbar region: Secondary | ICD-10-CM

## 2018-10-20 DIAGNOSIS — G894 Chronic pain syndrome: Secondary | ICD-10-CM

## 2018-10-20 DIAGNOSIS — G609 Hereditary and idiopathic neuropathy, unspecified: Secondary | ICD-10-CM

## 2018-10-20 MED ORDER — OXYCODONE-ACETAMINOPHEN 10-325 MG PO TABS: 1.0000 | ORAL_TABLET | Freq: Four times a day (QID) | ORAL | 0 refills | Status: DC | PRN

## 2018-10-20 MED ORDER — FENTANYL 50 MCG/HR TD PT72: 1.0000 | MEDICATED_PATCH | TRANSDERMAL | 0 refills | Status: DC

## 2018-10-20 NOTE — Telephone Encounter (Signed)
Dr. Riley Kill note was reviewed. Gabapentin and topamax refilled. Fentanyl and Oxycodone e-scribe to accommodate scheduled appointment.

## 2018-11-02 ENCOUNTER — Other Ambulatory Visit: Payer: Self-pay

## 2018-11-02 ENCOUNTER — Encounter: Payer: Medicare Other | Attending: Physical Medicine and Rehabilitation | Admitting: Physical Medicine & Rehabilitation

## 2018-11-02 ENCOUNTER — Encounter: Payer: Self-pay | Admitting: Physical Medicine & Rehabilitation

## 2018-11-02 DIAGNOSIS — G546 Phantom limb syndrome with pain: Secondary | ICD-10-CM

## 2018-11-02 DIAGNOSIS — G609 Hereditary and idiopathic neuropathy, unspecified: Secondary | ICD-10-CM | POA: Insufficient documentation

## 2018-11-02 DIAGNOSIS — Z89519 Acquired absence of unspecified leg below knee: Secondary | ICD-10-CM | POA: Diagnosis present

## 2018-11-02 DIAGNOSIS — M069 Rheumatoid arthritis, unspecified: Secondary | ICD-10-CM | POA: Insufficient documentation

## 2018-11-02 DIAGNOSIS — M4716 Other spondylosis with myelopathy, lumbar region: Secondary | ICD-10-CM | POA: Diagnosis present

## 2018-11-02 DIAGNOSIS — M179 Osteoarthritis of knee, unspecified: Secondary | ICD-10-CM | POA: Diagnosis present

## 2018-11-02 DIAGNOSIS — G894 Chronic pain syndrome: Secondary | ICD-10-CM | POA: Diagnosis not present

## 2018-11-02 DIAGNOSIS — M47816 Spondylosis without myelopathy or radiculopathy, lumbar region: Secondary | ICD-10-CM

## 2018-11-02 MED ORDER — OXYCODONE-ACETAMINOPHEN 10-325 MG PO TABS: 1.0000 | ORAL_TABLET | Freq: Four times a day (QID) | ORAL | 0 refills | Status: DC | PRN

## 2018-11-02 MED ORDER — FENTANYL 50 MCG/HR TD PT72: 1.0000 | MEDICATED_PATCH | TRANSDERMAL | 0 refills | Status: DC

## 2018-11-02 NOTE — Progress Notes (Signed)
Subjective:    Patient ID: Crystal Fitzpatrick, female    DOB: Jan 02, 1946, 72 y.o.   MRN: [id number]  HPI   Crystal Fitzpatrick is here in follow up of her chronic pain syndrome. We last talked in April and she tried increasing her gabapentin to TID and has felt relief. She continues to see the wound care clinic for her chronic LE edema, wounds.   She remains on perocet and fentanyl for pain relief with reasonable results.   She has maintained fairly well during the COVID crisis as she is a professed "home body".    Pain Inventory Average Pain 6 Pain Right Now 7 My pain is intermittent, constant, sharp, burning, stabbing, tingling and aching  In the last 24 hours, has pain interfered with the following? General activity 6 Relation with others 4 Enjoyment of life 6 What TIME of day is your pain at its worst? morning and night Sleep (in general) Fair  Pain is worse with: na Pain improves with: rest, therapy/exercise and medication Relief from Meds: 6  Mobility use a wheelchair transfers alone  Function retired  Neuro/Psych No problems in this area  Prior Studies Any changes since last visit?  no  Physicians involved in your care Any changes since last visit?  no   Family History  Problem Relation Age of Onset  . Breast cancer Sister   . Diabetes Other   . Stroke Other        Grandparents   Social History   Socioeconomic History  . Marital status: Divorced    Spouse name: Not on file  . Number of children: 1  . Years of education: Not on file  . Highest education level: Not on file  Occupational History  . Occupation: retired Oncologist: RETIRED  Social Needs  . Financial resource strain: Not on file  . Food insecurity    Worry: Not on file    Inability: Not on file  . Transportation needs    Medical: Not on file    Non-medical: Not on file  Tobacco Use  . Smoking status: Former Games developer  . Smokeless tobacco: Never Used  Substance  and Sexual Activity  . Alcohol use: No    Alcohol/week: 0.0 standard drinks  . Drug use: No  . Sexual activity: Not on file  Lifestyle  . Physical activity    Days per week: Not on file    Minutes per session: Not on file  . Stress: Not on file  Relationships  . Social Musician on phone: Not on file    Gets together: Not on file    Attends religious service: Not on file    Active member of club or organization: Not on file    Attends meetings of clubs or organizations: Not on file    Relationship status: Not on file  Other Topics Concern  . Not on file  Social History Narrative  . Not on file   Past Surgical History:  Procedure Laterality Date  . AMPUTATION  02/02/2008   below right knee  . foot surgury  1980 and 1981  . NASAL SEPTUM SURGERY    . 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 s/p Breast biopsy  1992  . s/p EGD  2007   with Botox for? Achalasia  . TONSILLECTOMY    . TUBAL LIGATION     Past Medical History:  Diagnosis Date  .  ABDOMINAL DISTENSION 10/11/2008  . Acute gouty arthropathy 12/11/2008  . Altered mental status 02/07/2010  . AMPUTATION, BELOW KNEE, RIGHT, HX OF 01/28/2009  . ANAPHYLACTIC SHOCK 05/15/2009  . ANEMIA-NOS 07/17/2008  . ANXIETY 07/17/2008  . ARTHRITIS 01/28/2009  . B12 DEFICIENCY 07/17/2008  . Cellulitis and abscess of leg, except foot 11/15/2008  . CHF 01/28/2009  . Chronic pain syndrome 02/14/2010  . COLONIC POLYPS, HX OF 07/17/2008  . DIABETES MELLITUS, TYPE II 07/17/2008  . Dysuria 11/11/2009  . FOOT PAIN 07/17/2008  . GERD 07/17/2008  . HEARING LOSS, RIGHT EAR 06/04/2009  . HYPERKALEMIA 10/31/2009  . Hyperlipemia 09/24/2010  . Hyperlipidemia 09/24/2010  . HYPERSOMNIA 02/14/2010  . HYPERTENSION 07/17/2008  . HYPONATREMIA 03/20/2010  . Hypoxemia 03/20/2010  . IBS 06/04/2009  . MENOPAUSAL DISORDER 12/03/2009  . NEUROPATHY, HX OF 01/28/2009  . Osteoporosis 11/16/2014  . PERIPHERAL EDEMA 08/30/2008  . Pneumonia, aspiration ([place]) 12/01/2011    Episode July 2013, tx per [facility]  . Pneumonia, organism unspecified(486) 02/14/2010  . RENAL INSUFFICIENCY 10/31/2009  . Rheumatoid arthritis(714.0) 07/17/2008  . Seizure ([place]) 10/26/2010  . SKIN LESION 06/04/2009  . SKIN ULCER, CHRONIC 02/08/2009  . Spinal stenosis of lumbar region 05/15/2015  . Trigeminal neuralgia 02/14/2010  . UNSPECIFIED HEPATITIS 01/28/2009  . Wheezing 12/03/2009   BP 133/76   Pulse 88   Temp 98.5 F (36.9 C)   SpO2 94%   Opioid Risk Score:   Fall Risk Score:  `1  Depression screen PHQ 2/9  Depression screen [facility] 2/9 07/27/2018 04/07/2018 03/11/2018 01/05/2018 12/08/2017 08/19/2017 06/24/2017  Decreased Interest 1 1 1 1 1 1  0  Down, Depressed, Hopeless 1 1 1 1 1 1  0  PHQ - 2 Score 2 2 2 2 2 2  0  Altered sleeping - - - - - 0 -  Tired, decreased energy - - - - - 1 -  Change in appetite - - - - - 1 -  Feeling bad or failure about yourself  - - - - - 1 -  Trouble concentrating - - - - - 0 -  Moving slowly or fidgety/restless - - - - - 0 -  Suicidal thoughts - - - - - 0 -  PHQ-9 Score - - - - - 5 -  Difficult doing work/chores - - - - - - -  Some recent data might be hidden    Review of Systems  Constitutional: Negative.   HENT: Negative.   Eyes: Negative.   Respiratory: Negative.   Cardiovascular: Negative.   Gastrointestinal: Negative.   Endocrine: Negative.   Genitourinary: Negative.   Musculoskeletal: Negative.   Skin: Negative.   Allergic/Immunologic: Negative.   Neurological: Negative.   Hematological: Negative.   Psychiatric/Behavioral: Negative.   All other systems reviewed and are negative.      Objective:   Physical Exam General: No acute distress HEENT: EOMI, oral membranes moist Cards: reg rate  Chest: normal effort Abdomen: Soft, NT, ND Skin: dry, intact  Extremities: 2-3+ edema left thigh.  left leg with compression wrap.  Neurological: Alert and oriented x3.  Decreased sensation to light touch in both lower  extremities UE's: 4/5 deltoids, biceps,triceps 4/5. LLE: 4/5 HF, 3+KE 2/5 ADF/PF. Cognitively she's intact Skin: wound not visualized Psychiatric: Pleasant.     Assessment & Plan:  1.Functional deficits secondary to right below-knee amputation, history of phantom limb pain.:  -consider powered chair or scooter at some point  2. Peripheral neuropathy: continue gabapentin  600mg  TID  ContinueFentanyl Patch: continue at dose for now. #10. Continue percocet 10/325 q6 prn #120.  -filled for 11/17/2018 -We will continue the controlled substance monitoring program, this consists of regular clinic visits, examinations, routine drug screening, pill counts as well as use of 01/18/2019 Controlled Substance Reporting System. [place] today.  . 3. History of rheumatoid arthritis. Continue Current Medication and Exercise and heat Therapy.  4. Osteoarthritis of left knee: Continue with Voltaren gel/ Exercise and heat therapy.  5. Chronic cellulitis. left legwoundwith associated edema/buttock wound: -continue [facility] care clinic mgt -doing well with current mgt . 6. Lumbar Spondylosis: continue HEP. 7. Herpes Zoster outbreak: - gabapentin 600mg  TID as above - lidoderm patches  Fifteen minutes of face to face patient care time were spent during this visit. All questions were encouraged and answered.  Follow up with me in 7 weeks .

## 2018-11-02 NOTE — Patient Instructions (Signed)
PLEASE FEEL FREE TO CALL OUR OFFICE WITH ANY PROBLEMS OR QUESTIONS ([phone])      

## 2018-11-08 ENCOUNTER — Other Ambulatory Visit: Payer: Self-pay

## 2018-12-13 ENCOUNTER — Other Ambulatory Visit: Payer: Self-pay | Admitting: Physical Medicine & Rehabilitation

## 2018-12-13 DIAGNOSIS — Z89519 Acquired absence of unspecified leg below knee: Secondary | ICD-10-CM

## 2018-12-13 DIAGNOSIS — M47816 Spondylosis without myelopathy or radiculopathy, lumbar region: Secondary | ICD-10-CM

## 2018-12-13 DIAGNOSIS — G546 Phantom limb syndrome with pain: Secondary | ICD-10-CM

## 2018-12-13 DIAGNOSIS — G894 Chronic pain syndrome: Secondary | ICD-10-CM

## 2018-12-14 NOTE — Telephone Encounter (Signed)
meds refilled 

## 2018-12-14 NOTE — Telephone Encounter (Signed)
Recieved electronic medication refill requests for fentanyl and oxycodone medications.  Verified in PMP that last fill date for these medications was on 11-17-2018 and patients next appointment date with Dr. Naaman Plummer is on 01-04-2019.  According to avaliable information patient will run out of medication prior to appointment.  Fill Date Written              Drug                                                     Qty               Days      Prescriber 11/17/2018 11/02/2018 Oxycodone-Acetaminophen 10-325              120.00     30       Za Swa  11/17/2018 11/02/2018 Fentanyl 50 Mcg/hr Patch                               10.00     30       Za Swa

## 2018-12-16 ENCOUNTER — Telehealth: Payer: Self-pay

## 2018-12-16 DIAGNOSIS — G894 Chronic pain syndrome: Secondary | ICD-10-CM

## 2018-12-16 DIAGNOSIS — Z89519 Acquired absence of unspecified leg below knee: Secondary | ICD-10-CM

## 2018-12-16 DIAGNOSIS — G546 Phantom limb syndrome with pain: Secondary | ICD-10-CM

## 2018-12-16 DIAGNOSIS — M47816 Spondylosis without myelopathy or radiculopathy, lumbar region: Secondary | ICD-10-CM

## 2018-12-16 MED ORDER — FENTANYL 50 MCG/HR TD PT72: 1.0000 | MEDICATED_PATCH | TRANSDERMAL | 0 refills | Status: DC

## 2018-12-16 NOTE — Telephone Encounter (Signed)
Patient called stating she only received one box of Fentanyl Patches from pharmacy, uses gets two. Only #5 patches was sent last not 11/02/18 ContinueFentanyl Patch: continue at 37mcg dose for now. #10. [facility]. Please advise

## 2018-12-16 NOTE — Telephone Encounter (Signed)
rx rewritten. That's a default setting which needs to be fixed.

## 2019-01-04 ENCOUNTER — Encounter: Payer: Medicare Other | Attending: Physical Medicine and Rehabilitation | Admitting: Physical Medicine & Rehabilitation

## 2019-01-04 ENCOUNTER — Other Ambulatory Visit: Payer: Self-pay

## 2019-01-04 ENCOUNTER — Encounter: Payer: Self-pay | Admitting: Physical Medicine & Rehabilitation

## 2019-01-04 DIAGNOSIS — Z89519 Acquired absence of unspecified leg below knee: Secondary | ICD-10-CM | POA: Diagnosis present

## 2019-01-04 DIAGNOSIS — M47816 Spondylosis without myelopathy or radiculopathy, lumbar region: Secondary | ICD-10-CM

## 2019-01-04 DIAGNOSIS — G609 Hereditary and idiopathic neuropathy, unspecified: Secondary | ICD-10-CM | POA: Insufficient documentation

## 2019-01-04 DIAGNOSIS — M179 Osteoarthritis of knee, unspecified: Secondary | ICD-10-CM | POA: Diagnosis present

## 2019-01-04 DIAGNOSIS — G894 Chronic pain syndrome: Secondary | ICD-10-CM

## 2019-01-04 DIAGNOSIS — G546 Phantom limb syndrome with pain: Secondary | ICD-10-CM | POA: Diagnosis not present

## 2019-01-04 DIAGNOSIS — M069 Rheumatoid arthritis, unspecified: Secondary | ICD-10-CM | POA: Insufficient documentation

## 2019-01-04 DIAGNOSIS — M4716 Other spondylosis with myelopathy, lumbar region: Secondary | ICD-10-CM | POA: Insufficient documentation

## 2019-01-04 MED ORDER — FENTANYL 50 MCG/HR TD PT72: 1.0000 | MEDICATED_PATCH | TRANSDERMAL | 0 refills | Status: DC

## 2019-01-04 MED ORDER — OXYCODONE-ACETAMINOPHEN 10-325 MG PO TABS: ORAL_TABLET | ORAL | 0 refills | Status: DC

## 2019-01-04 NOTE — Patient Instructions (Signed)
PLEASE FEEL FREE TO CALL OUR OFFICE WITH ANY PROBLEMS OR QUESTIONS ([phone])      

## 2019-01-04 NOTE — Progress Notes (Signed)
Subjective:    Patient ID: Crystal BrownerMarilyn Fitzpatrick, female    DOB: 09/17/1945, 73 y.o.   MRN: [id number]  HPI   Crystal Fitzpatrick is here in follow up of her chronic pain. She has been managing fairly well over the last couple months. Her wounds are gradually healing. She is more diligent about her edema control. She maintains her wound care ctr follow up.  She remains on percocet, fentanyl and gabapentin for her pain control.   She is looking into a service dog.   She asked me today about a new w/c. Her current chair is [id number] years old. She would like something which allows her more mobility and precise steering. She wants to go "up and down hills".       Pain Inventory Average Pain 9 Pain Right Now 9 My pain is sharp, burning, stabbing and aching  In the last 24 hours, has pain interfered with the following? General activity 1 Relation with others 2 Enjoyment of life 5 What TIME of day is your pain at its worst? morning and night Sleep (in general) Fair  Pain is worse with: inactivity and some activites Pain improves with: rest and medication Relief from Meds: 6  Mobility how many minutes can you walk? 0 ability to climb steps?  no do you drive?  no use a wheelchair transfers alone  Function disabled: date disabled na  Neuro/Psych tingling  Prior Studies Any changes since last visit?  no  Physicians involved in your care Any changes since last visit?  no   Family History  Problem Relation Age of Onset  . Breast cancer Sister   . Diabetes Other   . Stroke Other        Grandparents   Social History   Socioeconomic History  . Marital status: Divorced    Spouse name: Not on file  . Number of children: 1  . Years of education: Not on file  . Highest education level: Not on file  Occupational History  . Occupation: retired Oncologistmergency response coordinator    Employer: RETIRED  Social Needs  . Financial resource strain: Not on file  . Food insecurity    Worry: Not on file     Inability: Not on file  . Transportation needs    Medical: Not on file    Non-medical: Not on file  Tobacco Use  . Smoking status: Former Games developermoker  . Smokeless tobacco: Never Used  Substance and Sexual Activity  . Alcohol use: No    Alcohol/week: 0.0 standard drinks  . Drug use: No  . Sexual activity: Not on file  Lifestyle  . Physical activity    Days per week: Not on file    Minutes per session: Not on file  . Stress: Not on file  Relationships  . Social Musicianconnections    Talks on phone: Not on file    Gets together: Not on file    Attends religious service: Not on file    Active member of club or organization: Not on file    Attends meetings of clubs or organizations: Not on file    Relationship status: Not on file  Other Topics Concern  . Not on file  Social History Narrative  . Not on file   Past Surgical History:  Procedure Laterality Date  . AMPUTATION  02/02/2008   below right knee  . foot surgury  1980 and 1981  . NASAL SEPTUM SURGERY    . s/p BKA  9/09  For DFU and Osteomyelitis  . s/p Breast biopsy  1992  . s/p EGD  2007   with Botox for? Achalasia  . TONSILLECTOMY    . TUBAL LIGATION     Past Medical History:  Diagnosis Date  . ABDOMINAL DISTENSION 10/11/2008  . Acute gouty arthropathy 12/11/2008  . Altered mental status 02/07/2010  . AMPUTATION, BELOW KNEE, RIGHT, HX OF 01/28/2009  . ANAPHYLACTIC SHOCK 05/15/2009  . ANEMIA-NOS 07/17/2008  . ANXIETY 07/17/2008  . ARTHRITIS 01/28/2009  . B12 DEFICIENCY 07/17/2008  . Cellulitis and abscess of leg, except foot 11/15/2008  . CHF 01/28/2009  . Chronic pain syndrome 02/14/2010  . COLONIC POLYPS, HX OF 07/17/2008  . DIABETES MELLITUS, TYPE II 07/17/2008  . Dysuria 11/11/2009  . FOOT PAIN 07/17/2008  . GERD 07/17/2008  . HEARING LOSS, RIGHT EAR 06/04/2009  . HYPERKALEMIA 10/31/2009  . Hyperlipemia 09/24/2010  . Hyperlipidemia 09/24/2010  . HYPERSOMNIA 02/14/2010  . HYPERTENSION 07/17/2008  . HYPONATREMIA 03/20/2010  . Hypoxemia  03/20/2010  . IBS 06/04/2009  . MENOPAUSAL DISORDER 12/03/2009  . NEUROPATHY, HX OF 01/28/2009  . Osteoporosis 11/16/2014  . PERIPHERAL EDEMA 08/30/2008  . Pneumonia, aspiration ([facility]) 12/01/2011   Episode July 2013, tx per [facility]  . Pneumonia, organism unspecified(486) 02/14/2010  . RENAL INSUFFICIENCY 10/31/2009  . Rheumatoid arthritis(714.0) 07/17/2008  . Seizure ([facility]) 10/26/2010  . SKIN LESION 06/04/2009  . SKIN ULCER, CHRONIC 02/08/2009  . Spinal stenosis of lumbar region 05/15/2015  . Trigeminal neuralgia 02/14/2010  . UNSPECIFIED HEPATITIS 01/28/2009  . Wheezing 12/03/2009   BP (!) 142/90   Pulse 82   Temp 99 F (37.2 C)   SpO2 91%   Opioid Risk Score:   Fall Risk Score:  `1  Depression screen PHQ 2/9  Depression screen [facility] 2/9 07/27/2018 04/07/2018 03/11/2018 01/05/2018 12/08/2017 08/19/2017 06/24/2017  Decreased Interest 1 1 1 1 1 1  0  Down, Depressed, Hopeless 1 1 1 1 1 1  0  PHQ - 2 Score 2 2 2 2 2 2  0  Altered sleeping - - - - - 0 -  Tired, decreased energy - - - - - 1 -  Change in appetite - - - - - 1 -  Feeling bad or failure about yourself  - - - - - 1 -  Trouble concentrating - - - - - 0 -  Moving slowly or fidgety/restless - - - - - 0 -  Suicidal thoughts - - - - - 0 -  PHQ-9 Score - - - - - 5 -  Difficult doing work/chores - - - - - - -  Some recent data might be hidden    Review of Systems  Constitutional: Negative.   HENT: Negative.   Eyes: Negative.   Respiratory: Negative.   Cardiovascular: Negative.   Gastrointestinal: Negative.   Endocrine: Negative.   Genitourinary: Negative.   Musculoskeletal: Negative.   Skin: Negative.   Allergic/Immunologic: Negative.   Neurological: Negative.   Hematological: Negative.   Psychiatric/Behavioral: Negative.   All other systems reviewed and are negative.      Objective:   Physical Exam Physical Exam General: No acute distress. Remains obese HEENT: EOMI, oral membranes moist Cards: reg rate   Chest: normal effort Abdomen: Soft, NT, ND Skin: dry, intact Extremities: 2-3+,  edema right stump. left leg wrapped  Neurological:Alert and oriented x3. Decreased sensation to light touch in both lower extremitiesUE's: 4/5 deltoids, biceps,triceps 4/5. LLE: 4/5 HF, 3+KE 2/5 ADF/PF. Cognitively  she's intact Skin:wound not visualized Psychiatric:Pleasant.     Assessment & Plan:  1.Functional deficits secondary to right below-knee amputation, history of phantom limb pain.:  -consider powered chair or scooter at some point         -discussed with her the reasons why I don't think a smaller more mobile chair is practical for her.   2. Peripheral neuropathy: continue gabapentin 600mg  TID   ContinueFentanyl Patch: continue at 14mcg dose for now. #10. Continue percocet 10/325 q6 prn #120. RF today -We will continue the controlled substance monitoring program, this consists of regular clinic visits, examinations, routine drug screening, pill counts as well as use of [place] Controlled Substance Reporting System. [place] today.     3. History of rheumatoid arthritis. Continue Current Medication and Exercise and heat Therapy.  4. Osteoarthritis of left knee: Continue with Voltaren gel/ Exercise and heat therapy.  5. Chronic cellulitis. left legwoundwith associated edema/buttock wound: -continue [facility] care clinic mgt -doing well with current plan   -aggressive edema control 6. Lumbar Spondylosis: continue HEP. 7. Herpes Zoster outbreak: - gabapentin 600mg TIDfor neuropathic pain - lidoderm patches  Fifteen minutes of face to face patient care time were spent during this visit. All questions were encouraged and answered.  Follow up with NP in  About 6 weeks

## 2019-01-06 ENCOUNTER — Telehealth: Payer: Self-pay | Admitting: *Deleted

## 2019-01-06 NOTE — Telephone Encounter (Signed)
Ok. Let's make a note of it. thx

## 2019-01-06 NOTE — Telephone Encounter (Signed)
Reminder  added to appointment note.

## 2019-01-06 NOTE — Telephone Encounter (Signed)
Crystal Fitzpatrick called back to let Dr Naaman Plummer know that she has been talking with pharmacist @ [facility] in reference to getting off schedule with her Fentanyl. He says that for ordering the November Fentanyl she will only need 1 box ( #5 patches) and then resume rx for 2 box (#10 patches) in December and she will be back on track.

## 2019-01-24 ENCOUNTER — Other Ambulatory Visit: Payer: Self-pay | Admitting: Registered Nurse

## 2019-02-08 ENCOUNTER — Other Ambulatory Visit: Payer: Self-pay

## 2019-02-08 ENCOUNTER — Encounter: Payer: Self-pay | Admitting: Physical Medicine & Rehabilitation

## 2019-02-08 ENCOUNTER — Encounter: Payer: Medicare Other | Attending: Physical Medicine and Rehabilitation | Admitting: Physical Medicine & Rehabilitation

## 2019-02-08 VITALS — BP 109/69 | HR 80 | Temp 98.0°F | Ht 70.0 in | Wt 230.0 lb

## 2019-02-08 DIAGNOSIS — M179 Osteoarthritis of knee, unspecified: Secondary | ICD-10-CM | POA: Diagnosis present

## 2019-02-08 DIAGNOSIS — Z89519 Acquired absence of unspecified leg below knee: Secondary | ICD-10-CM | POA: Insufficient documentation

## 2019-02-08 DIAGNOSIS — G546 Phantom limb syndrome with pain: Secondary | ICD-10-CM

## 2019-02-08 DIAGNOSIS — M069 Rheumatoid arthritis, unspecified: Secondary | ICD-10-CM | POA: Insufficient documentation

## 2019-02-08 DIAGNOSIS — M4716 Other spondylosis with myelopathy, lumbar region: Secondary | ICD-10-CM | POA: Diagnosis present

## 2019-02-08 DIAGNOSIS — M47816 Spondylosis without myelopathy or radiculopathy, lumbar region: Secondary | ICD-10-CM

## 2019-02-08 DIAGNOSIS — M1712 Unilateral primary osteoarthritis, left knee: Secondary | ICD-10-CM

## 2019-02-08 DIAGNOSIS — G894 Chronic pain syndrome: Secondary | ICD-10-CM

## 2019-02-08 DIAGNOSIS — G609 Hereditary and idiopathic neuropathy, unspecified: Secondary | ICD-10-CM | POA: Insufficient documentation

## 2019-02-08 MED ORDER — OXYCODONE-ACETAMINOPHEN 10-325 MG PO TABS: ORAL_TABLET | ORAL | 0 refills | Status: DC

## 2019-02-08 MED ORDER — FENTANYL 50 MCG/HR TD PT72: 1.0000 | MEDICATED_PATCH | TRANSDERMAL | 0 refills | Status: DC

## 2019-02-08 NOTE — Patient Instructions (Addendum)
PLEASE FEEL FREE TO CALL OUR OFFICE WITH ANY PROBLEMS OR QUESTIONS ([phone])   Try antifungal powder to your buttocks. Keep area clean and dry.

## 2019-02-08 NOTE — Progress Notes (Signed)
Subjective:    Patient ID: Crystal BrownerMarilyn Fitzpatrick, female    DOB: 07/19/1945, 73 y.o.   MRN: [id number]  HPI   Crystal Fitzpatrick is here in follow up of her chronic pain.  She tells me that she is making progress with her left leg.  She is come off the Ace wrap and swelling has improved.  She does have some concerns over an area over her buttock.  Area is red and she has a scab.  Area is slightly tender to touch.  She remains on fentanyl and oxycodone for pain control per baseline.  The gabapentin helps with her neuropathic pain.  She uses her wheelchair as her primary means of mobility.  She still awaiting a potential service dog.  Pain Inventory Average Pain 8 Pain Right Now 8 My pain is stabbing  In the last 24 hours, has pain interfered with the following? General activity 5 Relation with others 5 Enjoyment of life 5 What TIME of day is your pain at its worst? night Sleep (in general) Fair  Pain is worse with: walking and some activites Pain improves with: medication Relief from Meds: 6  Mobility use a wheelchair needs help with transfers  Function retired  Neuro/Psych bladder control problems bowel control problems weakness trouble walking  Prior Studies Any changes since last visit?  no  Physicians involved in your care Any changes since last visit?  no   Family History  Problem Relation Age of Onset  . Breast cancer Sister   . Diabetes Other   . Stroke Other        Grandparents   Social History   Socioeconomic History  . Marital status: Divorced    Spouse name: Not on file  . Number of children: 1  . Years of education: Not on file  . Highest education level: Not on file  Occupational History  . Occupation: retired Oncologistmergency response coordinator    Employer: RETIRED  Social Needs  . Financial resource strain: Not on file  . Food insecurity    Worry: Not on file    Inability: Not on file  . Transportation needs    Medical: Not on file    Non-medical: Not on  file  Tobacco Use  . Smoking status: Former Games developermoker  . Smokeless tobacco: Never Used  Substance and Sexual Activity  . Alcohol use: No    Alcohol/week: 0.0 standard drinks  . Drug use: No  . Sexual activity: Not on file  Lifestyle  . Physical activity    Days per week: Not on file    Minutes per session: Not on file  . Stress: Not on file  Relationships  . Social Musicianconnections    Talks on phone: Not on file    Gets together: Not on file    Attends religious service: Not on file    Active member of club or organization: Not on file    Attends meetings of clubs or organizations: Not on file    Relationship status: Not on file  Other Topics Concern  . Not on file  Social History Narrative  . Not on file   Past Surgical History:  Procedure Laterality Date  . AMPUTATION  02/02/2008   below right knee  . foot surgury  1980 and 1981  . NASAL SEPTUM SURGERY    . 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 s/p Breast biopsy  1992  . s/p EGD  2007   with  Botox for? Achalasia  . TONSILLECTOMY    . TUBAL LIGATION     Past Medical History:  Diagnosis Date  . ABDOMINAL DISTENSION 10/11/2008  . Acute gouty arthropathy 12/11/2008  . Altered mental status 02/07/2010  . AMPUTATION, BELOW KNEE, RIGHT, HX OF 01/28/2009  . ANAPHYLACTIC SHOCK 05/15/2009  . ANEMIA-NOS 07/17/2008  . ANXIETY 07/17/2008  . ARTHRITIS 01/28/2009  . B12 DEFICIENCY 07/17/2008  . Cellulitis and abscess of leg, except foot 11/15/2008  . CHF 01/28/2009  . Chronic pain syndrome 02/14/2010  . COLONIC POLYPS, HX OF 07/17/2008  . DIABETES MELLITUS, TYPE II 07/17/2008  . Dysuria 11/11/2009  . FOOT PAIN 07/17/2008  . GERD 07/17/2008  . HEARING LOSS, RIGHT EAR 06/04/2009  . HYPERKALEMIA 10/31/2009  . Hyperlipemia 09/24/2010  . Hyperlipidemia 09/24/2010  . HYPERSOMNIA 02/14/2010  . HYPERTENSION 07/17/2008  . HYPONATREMIA 03/20/2010  . Hypoxemia 03/20/2010  . IBS 06/04/2009  . MENOPAUSAL DISORDER 12/03/2009  . NEUROPATHY, HX OF 01/28/2009  .  Osteoporosis 11/16/2014  . PERIPHERAL EDEMA 08/30/2008  . Pneumonia, aspiration (HCC) 12/01/2011   Episode July 2013, tx per [facility]  . Pneumonia, organism unspecified(486) 02/14/2010  . RENAL INSUFFICIENCY 10/31/2009  . Rheumatoid arthritis(714.0) 07/17/2008  . Seizure (HCC) 10/26/2010  . SKIN LESION 06/04/2009  . SKIN ULCER, CHRONIC 02/08/2009  . Spinal stenosis of lumbar region 05/15/2015  . Trigeminal neuralgia 02/14/2010  . UNSPECIFIED HEPATITIS 01/28/2009  . Wheezing 12/03/2009   BP 109/69   Pulse 80   Temp 98 F (36.7 C)   Ht 5\' 10"  (1.778 m)   Wt 230 lb (104.3 kg)   SpO2 96%   BMI 33.00 kg/m   Opioid Risk Score:   Fall Risk Score:  `1  Depression screen PHQ 2/9  Depression screen [facility] 2/9 07/27/2018 04/07/2018 03/11/2018 01/05/2018 12/08/2017 08/19/2017 06/24/2017  Decreased Interest 1 1 1 1 1 1  0  Down, Depressed, Hopeless 1 1 1 1 1 1  0  PHQ - 2 Score 2 2 2 2 2 2  0  Altered sleeping - - - - - 0 -  Tired, decreased energy - - - - - 1 -  Change in appetite - - - - - 1 -  Feeling bad or failure about yourself  - - - - - 1 -  Trouble concentrating - - - - - 0 -  Moving slowly or fidgety/restless - - - - - 0 -  Suicidal thoughts - - - - - 0 -  PHQ-9 Score - - - - - 5 -  Difficult doing work/chores - - - - - - -  Some recent data might be hidden     Review of Systems  Constitutional: Negative.   HENT: Negative.   Eyes: Negative.   Respiratory: Negative.   Cardiovascular: Negative.   Gastrointestinal: Negative.   Endocrine: Negative.   Genitourinary: Positive for difficulty urinating.  Musculoskeletal: Positive for gait problem.  Skin: Negative.   Allergic/Immunologic: Negative.   Neurological: Positive for weakness.  Hematological: Negative.   Psychiatric/Behavioral: Negative.   All other systems reviewed and are negative.      Objective:   Physical Exam  Physical Exam General: No acute distress HEENT: EOMI, oral membranes moist Cards: reg rate   Chest: normal effort Abdomen: Soft, NT, ND Skin: Erythema around the gluteal folds.  Small scab over the left buttock which appears superficial.  Area is sensitive to palpation. Extremities: 1-2+ edema LLE, 1+  edema right stump.left leg with chronic  vascular changes,   Neurological:Alert and oriented x3. Decreased sensation to light touch in both lower extremitiesUE's: 4/5 deltoids, biceps,triceps 4/5. LLE: 4/5 HF, 3+KE 2/5 ADF/PF.  Cognitively she is fairly intact of though does appear a bit fatigued today.  Psychiatric:Pleasant as always.    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   2. Peripheral neuropathy:continue gabapentin 600mg  TID      ContinueFentanyl Patch: continue at 54mcg dose for now.  #5 for this month and #10 for next to get her back in sync with her Percocet.  Continue percocet 10/325 q6 prn #120. RF today x2 -We will continue the controlled substance monitoring program, this consists of regular clinic visits, examinations, routine drug screening, pill counts as well as use of [place] Controlled Substance Reporting System. [place]CC[place] was reviewed today.   -Medication was refilled and a second prescription was sent to the patient's pharmacy for next month.    3. History of rheumatoid arthritis. Continue Current Medication and Exercise and heat Therapy.  4. Osteoarthritis of left knee: voltaren gel, rom and strengthening 5. Chronic cellulitis. left legwoundwith associated edema/buttock wound: -continue [facility] wound care clinic mgt. Reassured pt -doing well with current plan                   -continue aggressive edema control          -antifungal powder to erythematous area over buttocks.  I do not think there is anything specifically she needs to do for the scab as it looks superficial 6. Lumbar  Spondylosis: continue HEP. 7. Herpes Zoster outbreak: -maintain gabapentin 600mg TIDfor neuropathic pain - lidoderm patches   15 minutes of face to face patient care time were spent during this visit. All questions were encouraged and answered. Follow up with me in 2 months

## 2019-02-14 ENCOUNTER — Telehealth: Payer: Self-pay | Admitting: Physical Medicine & Rehabilitation

## 2019-02-14 NOTE — Telephone Encounter (Signed)
I called patient. She didn't answer nor was there a vm. She needs to contact the pharmacy to let them know and find out if they're willing to work with us/her on this. We can start there.   Nevertheless, she needs to know that this will be the last time that we can fill a med early. She HAS to be responsible for her meds at all times to continue on said meds.

## 2019-02-14 NOTE — Telephone Encounter (Signed)
Patient is calling to report that she has accidentally thrown away a box of her patches. Patient would like to know what she can do.  Please advise.  (This isn't the 1st time that she has done this).

## 2019-03-09 ENCOUNTER — Other Ambulatory Visit: Payer: Self-pay | Admitting: Physical Medicine & Rehabilitation

## 2019-04-03 ENCOUNTER — Other Ambulatory Visit: Payer: Self-pay | Admitting: Physical Medicine & Rehabilitation

## 2019-04-03 DIAGNOSIS — Z89519 Acquired absence of unspecified leg below knee: Secondary | ICD-10-CM

## 2019-04-05 ENCOUNTER — Other Ambulatory Visit: Payer: Self-pay | Admitting: Physical Medicine & Rehabilitation

## 2019-04-05 DIAGNOSIS — G546 Phantom limb syndrome with pain: Secondary | ICD-10-CM

## 2019-04-05 DIAGNOSIS — G894 Chronic pain syndrome: Secondary | ICD-10-CM

## 2019-04-05 DIAGNOSIS — Z89519 Acquired absence of unspecified leg below knee: Secondary | ICD-10-CM

## 2019-04-05 DIAGNOSIS — M47816 Spondylosis without myelopathy or radiculopathy, lumbar region: Secondary | ICD-10-CM

## 2019-04-05 NOTE — Telephone Encounter (Signed)
Patient left a message stating that she needs her fentanyl patches refilled.  She is out. Her next appt is with Dr. Naaman Plummer on November 25th. She wants to make sure we refill a 1 month supply and not a partial like last time

## 2019-04-06 ENCOUNTER — Other Ambulatory Visit: Payer: Self-pay | Admitting: Registered Nurse

## 2019-04-06 ENCOUNTER — Telehealth: Payer: Self-pay

## 2019-04-06 NOTE — Telephone Encounter (Signed)
Ptn states her pharmacy is waiting for your response?  Did you get the  message about this? situation

## 2019-04-06 NOTE — Telephone Encounter (Signed)
Fentanyl refilled.  

## 2019-04-06 NOTE — Telephone Encounter (Signed)
Notified Mrs Luster.

## 2019-04-12 ENCOUNTER — Other Ambulatory Visit: Payer: Self-pay

## 2019-04-12 ENCOUNTER — Encounter: Payer: Medicare Other | Attending: Physical Medicine and Rehabilitation | Admitting: Physical Medicine & Rehabilitation

## 2019-04-12 ENCOUNTER — Encounter: Payer: Self-pay | Admitting: Physical Medicine & Rehabilitation

## 2019-04-12 VITALS — BP 160/97 | HR 81 | Temp 98.0°F | Ht 70.0 in | Wt 230.0 lb

## 2019-04-12 DIAGNOSIS — M4716 Other spondylosis with myelopathy, lumbar region: Secondary | ICD-10-CM | POA: Insufficient documentation

## 2019-04-12 DIAGNOSIS — M069 Rheumatoid arthritis, unspecified: Secondary | ICD-10-CM | POA: Insufficient documentation

## 2019-04-12 DIAGNOSIS — M1712 Unilateral primary osteoarthritis, left knee: Secondary | ICD-10-CM

## 2019-04-12 DIAGNOSIS — G546 Phantom limb syndrome with pain: Secondary | ICD-10-CM | POA: Diagnosis present

## 2019-04-12 DIAGNOSIS — M179 Osteoarthritis of knee, unspecified: Secondary | ICD-10-CM | POA: Diagnosis present

## 2019-04-12 DIAGNOSIS — M47816 Spondylosis without myelopathy or radiculopathy, lumbar region: Secondary | ICD-10-CM | POA: Diagnosis present

## 2019-04-12 DIAGNOSIS — G609 Hereditary and idiopathic neuropathy, unspecified: Secondary | ICD-10-CM

## 2019-04-12 DIAGNOSIS — G894 Chronic pain syndrome: Secondary | ICD-10-CM

## 2019-04-12 DIAGNOSIS — Z89519 Acquired absence of unspecified leg below knee: Secondary | ICD-10-CM | POA: Diagnosis not present

## 2019-04-12 DIAGNOSIS — L03116 Cellulitis of left lower limb: Secondary | ICD-10-CM

## 2019-04-12 MED ORDER — FENTANYL 50 MCG/HR TD PT72: MEDICATED_PATCH | TRANSDERMAL | 0 refills | Status: DC

## 2019-04-12 MED ORDER — MUPIROCIN 2 % EX OINT: TOPICAL_OINTMENT | CUTANEOUS | 3 refills | Status: DC

## 2019-04-12 MED ORDER — OXYCODONE-ACETAMINOPHEN 10-325 MG PO TABS: ORAL_TABLET | ORAL | 0 refills | Status: DC

## 2019-04-12 NOTE — Progress Notes (Signed)
Subjective:    Patient ID: Crystal Fitzpatrick, female    DOB: [birth year], 73 y.o.   MRN: [id number]  HPI   Crystal Fitzpatrick is here in follow up of her chronic pain. She has been having ongoing pain in her left leg and has developed a cellulitis again. She is back on abx which is Cipro.  She has ongoing edema in the leg.  She has been wearing some stockings for these but has gotten away from those recently.  She has ongoing breakdown in her sacral and buttock areas.  She has been followed by the wound clinic for these.  One problem that is contributing is her wheelchair cushion which seems to be out of shape and in need of replacement.  Apparently she is under restrictions with her Medicare as far as acquiring a new one.  She is tried some options alternatives at home but none of these have really worked for her as far as fitting in the chair is concerned.  For pain she remains on fentanyl as well as the Percocet.  She misplaced her fentanyl 2 weeks ago and went without just taking her Percocet in the meantime.  She had some withdrawal type symptoms but these were not too bad fortunately for her.  Her pain increased quite a bit until we able to refill this for her.  She is also taking gabapentin and Flexeril for spasms.  Pain Inventory Average Pain 7 Pain Right Now 9 My pain is sharp, burning, stabbing and aching  In the last 24 hours, has pain interfered with the following? General activity 7 Relation with others 8 Enjoyment of life 4 What TIME of day is your pain at its worst? morning Sleep (in general) Poor  Pain is worse with: inactivity Pain improves with: rest, heat/ice, therapy/exercise and medication Relief from Meds: 7  Mobility Do you have any goals in this area?  no  Function Do you have any goals in this area?  no  Neuro/Psych bladder control problems trouble walking  Prior Studies Any changes since last visit?  no  Physicians involved in your care Any changes since last visit?   no   Family History  Problem Relation Age of Onset  . Breast cancer Sister   . Diabetes Other   . Stroke Other        Grandparents   Social History   Socioeconomic History  . Marital status: Divorced    Spouse name: Not on file  . Number of children: 1  . Years of education: Not on file  . Highest education level: Not on file  Occupational History  . Occupation: retired Oncologist: RETIRED  Social Needs  . Financial resource strain: Not on file  . Food insecurity    Worry: Not on file    Inability: Not on file  . Transportation needs    Medical: Not on file    Non-medical: Not on file  Tobacco Use  . Smoking status: Former Games developer  . Smokeless tobacco: Never Used  Substance and Sexual Activity  . Alcohol use: No    Alcohol/week: 0.0 standard drinks  . Drug use: No  . Sexual activity: Not on file  Lifestyle  . Physical activity    Days per week: Not on file    Minutes per session: Not on file  . Stress: Not on file  Relationships  . Social connections    Talks on phone: Not on file  Gets together: Not on file    Attends religious service: Not on file    Active member of club or organization: Not on file    Attends meetings of clubs or organizations: Not on file    Relationship status: Not on file  Other Topics Concern  . Not on file  Social History Narrative  . Not on file   Past Surgical History:  Procedure Laterality Date  . AMPUTATION  02/02/2008   below right knee  . foot surgury  1980 and 1981  . NASAL SEPTUM SURGERY    . 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 s/p Breast biopsy  1992  . s/p EGD  2007   with Botox for? Achalasia  . TONSILLECTOMY    . TUBAL LIGATION     Past Medical History:  Diagnosis Date  . ABDOMINAL DISTENSION 10/11/2008  . Acute gouty arthropathy 12/11/2008  . Altered mental status 02/07/2010  . AMPUTATION, BELOW KNEE, RIGHT, HX OF 01/28/2009  . ANAPHYLACTIC SHOCK 05/15/2009  .  ANEMIA-NOS 07/17/2008  . ANXIETY 07/17/2008  . ARTHRITIS 01/28/2009  . B12 DEFICIENCY 07/17/2008  . Cellulitis and abscess of leg, except foot 11/15/2008  . CHF 01/28/2009  . Chronic pain syndrome 02/14/2010  . COLONIC POLYPS, HX OF 07/17/2008  . DIABETES MELLITUS, TYPE II 07/17/2008  . Dysuria 11/11/2009  . FOOT PAIN 07/17/2008  . GERD 07/17/2008  . HEARING LOSS, RIGHT EAR 06/04/2009  . HYPERKALEMIA 10/31/2009  . Hyperlipemia 09/24/2010  . Hyperlipidemia 09/24/2010  . HYPERSOMNIA 02/14/2010  . HYPERTENSION 07/17/2008  . HYPONATREMIA 03/20/2010  . Hypoxemia 03/20/2010  . IBS 06/04/2009  . MENOPAUSAL DISORDER 12/03/2009  . NEUROPATHY, HX OF 01/28/2009  . Osteoporosis 11/16/2014  . PERIPHERAL EDEMA 08/30/2008  . Pneumonia, aspiration ([place]) 12/01/2011   Episode July 2013, tx per [place]  . Pneumonia, organism unspecified(486) 02/14/2010  . RENAL INSUFFICIENCY 10/31/2009  . Rheumatoid arthritis(714.0) 07/17/2008  . Seizure ([place]) 10/26/2010  . SKIN LESION 06/04/2009  . SKIN ULCER, CHRONIC 02/08/2009  . Spinal stenosis of lumbar region 05/15/2015  . Trigeminal neuralgia 02/14/2010  . UNSPECIFIED HEPATITIS 01/28/2009  . Wheezing 12/03/2009   BP (!) 160/97   Pulse 81   Temp 98 F (36.7 C)   Ht 5\' 10"  (1.778 m)   Wt 230 lb (104.3 kg)   SpO2 94%   BMI 33.00 kg/m   Opioid Risk Score:   Fall Risk Score:  `1  Depression screen PHQ 2/9  Depression screen [facility] 2/9 07/27/2018 04/07/2018 03/11/2018 01/05/2018 12/08/2017 08/19/2017 06/24/2017  Decreased Interest 1 1 1 1 1 1  0  Down, Depressed, Hopeless 1 1 1 1 1 1  0  PHQ - 2 Score 2 2 2 2 2 2  0  Altered sleeping - - - - - 0 -  Tired, decreased energy - - - - - 1 -  Change in appetite - - - - - 1 -  Feeling bad or failure about yourself  - - - - - 1 -  Trouble concentrating - - - - - 0 -  Moving slowly or fidgety/restless - - - - - 0 -  Suicidal thoughts - - - - - 0 -  PHQ-9 Score - - - - - 5 -  Difficult doing work/chores - - - - - - -  Some recent data  might be hidden     Review of Systems  Constitutional: Negative.   HENT: Negative.  Eyes: Negative.   Respiratory: Negative.   Cardiovascular: Negative.   Gastrointestinal: Negative.   Endocrine: Negative.   Genitourinary: Positive for difficulty urinating.  Musculoskeletal: Positive for arthralgias and gait problem. Negative for myalgias.  Skin: Positive for rash.  Allergic/Immunologic: Negative.   Hematological: Negative.   Psychiatric/Behavioral: Negative.   All other systems reviewed and are negative.      Objective:   Physical Exam General: No acute distress HEENT: EOMI, oral membranes moist Cards: reg rate  Chest: normal effort Abdomen: Soft, NT, ND    Skin: did not view sacrum or buttocks.  'Extremities:1-2+ edema LLE, 1+edema right stump.left legwith chronic vascular changes and an area of erythema laterally on lower left leg.  Neurological:Alert and oriented x3. Decreased sensation to light touch in both lower extremitiesUE's: 4/5 deltoids, biceps,triceps 4/5. LLE: 4/5 HF, 3+KE 2/5 ADF/PF.  cognitively intact  Psychiatric:Pleasant as always.    Assessment & Plan:  1.Functional deficits secondary to right below-knee amputation, history of phantom limb pain.:  -service dog search pending.   2. Peripheral neuropathy:continue gabapentin 600mg  TID ContinueFentanyl Patch: continue at dose for now.  #5 for this month and #10 for next to get her back in sync with her Percocet.  Continue percocet 10/325 q6 prn #120.RF both today -This discussed the importance of safety with her medications. -We will continue the controlled substance monitoring program, this consists of regular clinic visits, examinations, routine drug screening, pill counts as well as use of Controlled Substance Reporting System. [place] was reviewed today.   -Medication was refilled and a second prescription was sent to the  patient's pharmacy for next month.   3. History of rheumatoid arthritis. Continue Current Medication and Exercise and heat Therapy.  4. Osteoarthritis of left knee: voltaren gel, rom and strengthening 5. Chronic cellulitis. left legwoundwith associated edema/buttock wound: - cipro currently   =needs to control edema! 6. Lumbar Spondylosis: continue HEP. 7. Herpes Zoster : -conitnue gabapentin 600mg TIDfor neuropathic pain - lidoderm patches   15 minutes of face to face patient care time were spent during this visit. All questions were encouraged and answered. Follow up with me in 2 months

## 2019-04-12 NOTE — Patient Instructions (Addendum)
PLEASE FEEL FREE TO CALL OUR OFFICE WITH ANY PROBLEMS OR QUESTIONS ([phone])     VELCRO COMPRESSION STOCKINGS!!

## 2019-05-01 ENCOUNTER — Telehealth: Payer: Self-pay | Admitting: *Deleted

## 2019-05-01 DIAGNOSIS — M47816 Spondylosis without myelopathy or radiculopathy, lumbar region: Secondary | ICD-10-CM

## 2019-05-01 DIAGNOSIS — G546 Phantom limb syndrome with pain: Secondary | ICD-10-CM

## 2019-05-01 DIAGNOSIS — Z89519 Acquired absence of unspecified leg below knee: Secondary | ICD-10-CM

## 2019-05-01 DIAGNOSIS — G894 Chronic pain syndrome: Secondary | ICD-10-CM

## 2019-05-01 NOTE — Telephone Encounter (Signed)
Crystal Fitzpatrick called and is asking if she can be ok'd to get her oxycodone refilled 2 days early.  She says she has been in extreme pain but is planning on getting a pill box to keep up with her doses.  The Rx at the pharmacy says DNF before 05/04/19.  It sounds like she placed her last patch on Saturday 04/29/19. Please advise.

## 2019-05-02 MED ORDER — OXYCODONE-ACETAMINOPHEN 10-325 MG PO TABS: ORAL_TABLET | ORAL | 0 refills | Status: DC

## 2019-05-02 MED ORDER — FENTANYL 50 MCG/HR TD PT72: MEDICATED_PATCH | TRANSDERMAL | 0 refills | Status: DC

## 2019-05-02 NOTE — Telephone Encounter (Signed)
I called and spoke with patient. I will fill early. I also will allow fentanyl to be filled early so that she can get together.  Told her I won't fill early again in the future. She has to be responsible with her meds.

## 2019-05-31 ENCOUNTER — Other Ambulatory Visit: Payer: Self-pay | Admitting: Physical Medicine & Rehabilitation

## 2019-05-31 DIAGNOSIS — G546 Phantom limb syndrome with pain: Secondary | ICD-10-CM

## 2019-05-31 DIAGNOSIS — G609 Hereditary and idiopathic neuropathy, unspecified: Secondary | ICD-10-CM

## 2019-05-31 DIAGNOSIS — M4716 Other spondylosis with myelopathy, lumbar region: Secondary | ICD-10-CM

## 2019-06-14 ENCOUNTER — Encounter: Payer: Medicare Other | Attending: Physical Medicine and Rehabilitation | Admitting: Physical Medicine & Rehabilitation

## 2019-06-14 DIAGNOSIS — M47816 Spondylosis without myelopathy or radiculopathy, lumbar region: Secondary | ICD-10-CM | POA: Insufficient documentation

## 2019-06-14 DIAGNOSIS — Z89519 Acquired absence of unspecified leg below knee: Secondary | ICD-10-CM | POA: Insufficient documentation

## 2019-06-14 DIAGNOSIS — L03116 Cellulitis of left lower limb: Secondary | ICD-10-CM | POA: Insufficient documentation

## 2019-06-14 DIAGNOSIS — M069 Rheumatoid arthritis, unspecified: Secondary | ICD-10-CM | POA: Insufficient documentation

## 2019-06-14 DIAGNOSIS — G609 Hereditary and idiopathic neuropathy, unspecified: Secondary | ICD-10-CM | POA: Insufficient documentation

## 2019-06-14 DIAGNOSIS — M4716 Other spondylosis with myelopathy, lumbar region: Secondary | ICD-10-CM | POA: Insufficient documentation

## 2019-06-14 DIAGNOSIS — G546 Phantom limb syndrome with pain: Secondary | ICD-10-CM | POA: Insufficient documentation

## 2019-06-14 DIAGNOSIS — M179 Osteoarthritis of knee, unspecified: Secondary | ICD-10-CM | POA: Insufficient documentation

## 2019-06-14 DIAGNOSIS — G894 Chronic pain syndrome: Secondary | ICD-10-CM | POA: Insufficient documentation

## 2019-06-23 ENCOUNTER — Other Ambulatory Visit: Payer: Self-pay | Admitting: Physical Medicine & Rehabilitation

## 2019-06-23 DIAGNOSIS — Z89519 Acquired absence of unspecified leg below knee: Secondary | ICD-10-CM

## 2019-06-23 NOTE — Telephone Encounter (Signed)
Also need refill on Fentanyl patch last filled #10 on 05/31/2019, lost patch while in hospital. Last filled Oxycodone/APAP 05/31/2019 #120, she states she believes that took extra because she was in a lot of pain.

## 2019-07-02 MED ORDER — POLYETHYLENE GLYCOL 3350 17 GM/SCOOP PO POWD
17.00 | ORAL | Status: DC
Start: 2019-07-03 — End: 2019-07-02

## 2019-07-02 MED ORDER — HEPARIN SODIUM (PORCINE) 5000 UNIT/ML IJ SOLN
5000.00 | INTRAMUSCULAR | Status: DC
Start: 2019-07-02 — End: 2019-07-02

## 2019-07-02 MED ORDER — CYCLOBENZAPRINE HCL 10 MG PO TABS
10.00 | ORAL_TABLET | ORAL | Status: DC
Start: ? — End: 2019-07-02

## 2019-07-02 MED ORDER — ONDANSETRON HCL 4 MG/2ML IJ SOLN
4.00 | INTRAMUSCULAR | Status: DC
Start: ? — End: 2019-07-02

## 2019-07-02 MED ORDER — SENNOSIDES-DOCUSATE SODIUM 8.6-50 MG PO TABS
1.00 | ORAL_TABLET | ORAL | Status: DC
Start: ? — End: 2019-07-02

## 2019-07-02 MED ORDER — TOPIRAMATE 100 MG PO TABS
300.00 | ORAL_TABLET | ORAL | Status: DC
Start: 2019-07-02 — End: 2019-07-02

## 2019-07-02 MED ORDER — LEVOFLOXACIN 500 MG PO TABS
500.00 | ORAL_TABLET | ORAL | Status: DC
Start: ? — End: 2019-07-02

## 2019-07-02 MED ORDER — DULOXETINE HCL 30 MG PO CPEP
60.00 | ORAL_CAPSULE | ORAL | Status: DC
Start: 2019-07-03 — End: 2019-07-02

## 2019-07-02 MED ORDER — DOXYCYCLINE HYCLATE 100 MG PO TABS
100.00 | ORAL_TABLET | ORAL | Status: DC
Start: 2019-07-02 — End: 2019-07-02

## 2019-07-02 MED ORDER — FENTANYL 75 MCG/HR TD PT72
1.00 | MEDICATED_PATCH | TRANSDERMAL | Status: DC
Start: 2019-07-04 — End: 2019-07-02

## 2019-07-02 MED ORDER — LOSARTAN POTASSIUM 50 MG PO TABS
50.00 | ORAL_TABLET | ORAL | Status: DC
Start: 2019-07-03 — End: 2019-07-02

## 2019-07-02 MED ORDER — HYDROXYZINE HCL 25 MG PO TABS
25.00 | ORAL_TABLET | ORAL | Status: DC
Start: ? — End: 2019-07-02

## 2019-07-02 MED ORDER — MUPIROCIN 2 % EX OINT
TOPICAL_OINTMENT | CUTANEOUS | Status: DC
Start: 2019-07-03 — End: 2019-07-02

## 2019-07-02 MED ORDER — OXYCODONE-ACETAMINOPHEN 10-325 MG PO TABS
1.00 | ORAL_TABLET | ORAL | Status: DC
Start: ? — End: 2019-07-02

## 2019-07-02 MED ORDER — GABAPENTIN 300 MG PO CAPS
600.00 | ORAL_CAPSULE | ORAL | Status: DC
Start: 2019-07-02 — End: 2019-07-02

## 2019-07-02 MED ORDER — ACETAMINOPHEN 325 MG PO TABS
650.00 | ORAL_TABLET | ORAL | Status: DC
Start: ? — End: 2019-07-02

## 2019-07-02 MED ORDER — FENTANYL CITRATE (PF) 50 MCG/ML IJ SOLN
50.00 | INTRAMUSCULAR | Status: DC
Start: ? — End: 2019-07-02

## 2019-07-05 ENCOUNTER — Encounter: Payer: Medicare Other | Admitting: Physical Medicine & Rehabilitation

## 2019-07-19 ENCOUNTER — Encounter: Payer: Medicare Other | Admitting: Physical Medicine & Rehabilitation

## 2019-08-03 ENCOUNTER — Telehealth: Payer: Self-pay | Admitting: Family Medicine

## 2019-08-03 NOTE — Telephone Encounter (Signed)
Ilene Qua from [facility] will be doing home health orders. If PCP has any questions, she said to give her a call. She stated she will wait until the pt follows up with PCP which is April 7th and then she will fax her orders.   Misty Stanley can be reached at [phone]

## 2019-08-04 ENCOUNTER — Telehealth: Payer: Self-pay | Admitting: Physical Medicine & Rehabilitation

## 2019-08-04 DIAGNOSIS — Z89519 Acquired absence of unspecified leg below knee: Secondary | ICD-10-CM

## 2019-08-04 NOTE — Telephone Encounter (Signed)
I tried to call Crystal Fitzpatrick but there was no answer. Why was she in an assisted living facility?[name] Why would she be "discharged" from assisted living as well. That's the kind of place you live at. It's not a SNF. When was the last time she's had fentanyl?   There are a few questions which need to be answered before we refill her fentanyl patch.    thx

## 2019-08-04 NOTE — Telephone Encounter (Signed)
Noted  

## 2019-08-04 NOTE — Telephone Encounter (Signed)
Patient called to let us know that she came home Tuesday from an assisted living facility [facility]) in [place].  They didn't sent her home with any Fentanyl Patch's.  She will need a refill on them.

## 2019-08-07 MED ORDER — FENTANYL 25 MCG/HR TD PT72: 1.0000 | MEDICATED_PATCH | TRANSDERMAL | 0 refills | Status: DC

## 2019-08-07 NOTE — Telephone Encounter (Signed)
Will reduce to fentanyl patch as she last placed her on Tuesday last week. If she was absorbing it all, she would have had experienced withdrawal symptoms. rx sent to pharmacy.  Also has been "hypoglycemic".  We discussed

## 2019-08-07 NOTE — Telephone Encounter (Signed)
Patient notified

## 2019-08-07 NOTE — Telephone Encounter (Signed)
Patient was in a SNF,she had told me a Assisted Living Facility.  The reason she was there she was admitted to [facility] about a month ago, she had threw herself out of her wheelchair because it locked up and she didn't get to phone for 2days.  She has wounds and prior to being admitted to hospital her urologist wanted her to be admitted for sacral wounds.  She did tell me that the SNF she was in put her last patch on Tuesday March 9.  She now is telling me that Apolinar Junes (person who checks on her, had Covid today) she is now under quarantine.

## 2019-08-17 ENCOUNTER — Other Ambulatory Visit: Payer: Self-pay | Admitting: Physical Medicine & Rehabilitation

## 2019-08-17 DIAGNOSIS — Z89519 Acquired absence of unspecified leg below knee: Secondary | ICD-10-CM

## 2019-08-21 MED ORDER — OXYCODONE-ACETAMINOPHEN 10-325 MG PO TABS: 1.0000 | ORAL_TABLET | Freq: Four times a day (QID) | ORAL | 0 refills | Status: DC | PRN

## 2019-08-21 NOTE — Telephone Encounter (Addendum)
Patient has called again about her oxycodone refill

## 2019-08-21 NOTE — Telephone Encounter (Signed)
Purvis Sheffield CMA reports he is awaiting [name] response .

## 2019-08-21 NOTE — Addendum Note (Signed)
Addended by: Faith Rogue T on: 08/21/2019 06:09 PM   Modules accepted: Orders

## 2019-08-21 NOTE — Telephone Encounter (Signed)
Percocet rfd

## 2019-08-22 ENCOUNTER — Telehealth: Payer: Self-pay | Admitting: Family Medicine

## 2019-08-22 ENCOUNTER — Other Ambulatory Visit: Payer: Self-pay

## 2019-08-22 NOTE — Telephone Encounter (Signed)
Judeth Cornfield from [facility] is requesting verbal orders for pt to have CT val, urine specimen and social worker go out to her home.   Judeth Cornfield Phone: [phone]

## 2019-08-23 ENCOUNTER — Ambulatory Visit: Payer: Medicare Other | Admitting: Family Medicine

## 2019-08-23 ENCOUNTER — Telehealth: Payer: Self-pay | Admitting: Family Medicine

## 2019-08-23 NOTE — Telephone Encounter (Signed)
Just a FYI   She had a fall last week  She fell out of bed and no injury  EMS had to come get her up and declined going to the hospital  [facility] Morrie Sheldon  [phone]

## 2019-08-23 NOTE — Telephone Encounter (Signed)
Please okay these orders  ?

## 2019-08-24 NOTE — Telephone Encounter (Signed)
Alan Mulder with [facility]. No answer. LVM for okay verbal orders for pt to have CT val, urine specimen and social worker go out to her home.

## 2019-08-30 ENCOUNTER — Encounter: Payer: Self-pay | Admitting: Physical Medicine & Rehabilitation

## 2019-08-30 ENCOUNTER — Other Ambulatory Visit: Payer: Self-pay

## 2019-08-30 ENCOUNTER — Encounter: Payer: Medicare Other | Attending: Physical Medicine and Rehabilitation | Admitting: Physical Medicine & Rehabilitation

## 2019-08-30 VITALS — BP 95/60 | HR 84 | Temp 97.5°F | Wt 230.0 lb

## 2019-08-30 DIAGNOSIS — M179 Osteoarthritis of knee, unspecified: Secondary | ICD-10-CM | POA: Diagnosis present

## 2019-08-30 DIAGNOSIS — G609 Hereditary and idiopathic neuropathy, unspecified: Secondary | ICD-10-CM

## 2019-08-30 DIAGNOSIS — Z89519 Acquired absence of unspecified leg below knee: Secondary | ICD-10-CM | POA: Diagnosis present

## 2019-08-30 DIAGNOSIS — M069 Rheumatoid arthritis, unspecified: Secondary | ICD-10-CM | POA: Diagnosis present

## 2019-08-30 DIAGNOSIS — G894 Chronic pain syndrome: Secondary | ICD-10-CM | POA: Diagnosis present

## 2019-08-30 DIAGNOSIS — L03116 Cellulitis of left lower limb: Secondary | ICD-10-CM | POA: Insufficient documentation

## 2019-08-30 DIAGNOSIS — M1712 Unilateral primary osteoarthritis, left knee: Secondary | ICD-10-CM | POA: Insufficient documentation

## 2019-08-30 DIAGNOSIS — M47816 Spondylosis without myelopathy or radiculopathy, lumbar region: Secondary | ICD-10-CM | POA: Insufficient documentation

## 2019-08-30 DIAGNOSIS — G546 Phantom limb syndrome with pain: Secondary | ICD-10-CM | POA: Diagnosis present

## 2019-08-30 DIAGNOSIS — M4716 Other spondylosis with myelopathy, lumbar region: Secondary | ICD-10-CM | POA: Diagnosis present

## 2019-08-30 MED ORDER — TELMISARTAN 20 MG PO TABS: 10.0000 mg | ORAL_TABLET | Freq: Every day | ORAL | 2 refills | Status: DC

## 2019-08-30 MED ORDER — FENTANYL 25 MCG/HR TD PT72: 1.0000 | MEDICATED_PATCH | TRANSDERMAL | 0 refills | Status: DC

## 2019-08-30 MED ORDER — OXYCODONE-ACETAMINOPHEN 10-325 MG PO TABS: 1.0000 | ORAL_TABLET | Freq: Four times a day (QID) | ORAL | 0 refills | Status: DC | PRN

## 2019-08-30 NOTE — Patient Instructions (Signed)
PLEASE FEEL FREE TO CALL OUR OFFICE WITH ANY PROBLEMS OR QUESTIONS ([phone])      

## 2019-08-30 NOTE — Progress Notes (Addendum)
Subjective:    Patient ID: Crystal Fitzpatrick, female    DOB: 1945-05-28, 74 y.o.   MRN: [id number]  HPI   Crystal Fitzpatrick is here in follow up of her chronic pain. She still is dealing with her chronic backside wounds. She has been seen by the wound care clinic and is followed at home currentlyas well. Her wounds are apparently starting show some signs of improvement.   She deals with chronic LLE wounds as well although it's doing fairly well at present.   Things are going a little better at home with increased social supports. She is receiving HH therapy as well as aids/friends. She has found it increasingly difficult to get around her own. W/c propulsion is hard, and she is unable to ambulate.     Pain Inventory Average Pain 9 Pain Right Now 6 My pain is sharp, burning, stabbing and aching  In the last 24 hours, has pain interfered with the following? General activity 7 Relation with others 7 Enjoyment of life 7 What TIME of day is your pain at its worst? morning Sleep (in general) Poor  Pain is worse with: sitting Pain improves with: medication Relief from Meds: 7  Mobility ability to climb steps?  no do you drive?  no use a wheelchair transfers alone  Function I need assistance with the following:  dressing, bathing, household duties and shopping  Neuro/Psych bladder control problems trouble walking  Prior Studies Any changes since last visit?  no  Physicians involved in your care Any changes since last visit?  yes Hospital visit x1wk, SNF and Dr. Mullin-Urologist    Family History  Problem Relation Age of Onset  . Breast cancer Sister   . Diabetes Other   . Stroke Other        Grandparents   Social History   Socioeconomic History  . Marital status: Divorced    Spouse name: Not on file  . Number of children: 1  . Years of education: Not on file  . Highest education level: Not on file  Occupational History  . Occupation: retired International aid/development worker: RETIRED  Tobacco Use  . Smoking status: Former Games developer  . Smokeless tobacco: Never Used  Substance and Sexual Activity  . Alcohol use: No    Alcohol/week: 0.0 standard drinks  . Drug use: No  . Sexual activity: Not on file  Other Topics Concern  . Not on file  Social History Narrative  . Not on file   Social Determinants of Health   Financial Resource Strain:   . Difficulty of Paying Living Expenses:   Food Insecurity:   . Worried About Programme researcher, broadcasting/film/video in the Last Year:   . Barista in the Last Year:   Transportation Needs:   . Freight forwarder (Medical):   [name] Lack of Transportation (Non-Medical):   Physical Activity:   . Days of Exercise per Week:   . Minutes of Exercise per Session:   Stress:   . Feeling of Stress :   Social Connections:   . Frequency of Communication with Friends and Family:   . Frequency of Social Gatherings with Friends and Family:   . Attends Religious Services:   . Active Member of Clubs or Organizations:   . Attends Banker Meetings:   [name] Marital Status:    Past Surgical History:  Procedure Laterality Date  . AMPUTATION  02/02/2008   below right knee  .  foot surgury  1980 and 1981  . NASAL SEPTUM SURGERY    . 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 s/p Breast biopsy  1992  . s/p EGD  2007   with Botox for? Achalasia  . TONSILLECTOMY    . TUBAL LIGATION     Past Medical History:  Diagnosis Date  . ABDOMINAL DISTENSION 10/11/2008  . Acute gouty arthropathy 12/11/2008  . Altered mental status 02/07/2010  . AMPUTATION, BELOW KNEE, RIGHT, HX OF 01/28/2009  . ANAPHYLACTIC SHOCK 05/15/2009  . ANEMIA-NOS 07/17/2008  . ANXIETY 07/17/2008  . ARTHRITIS 01/28/2009  . B12 DEFICIENCY 07/17/2008  . Cellulitis and abscess of leg, except foot 11/15/2008  . CHF 01/28/2009  . Chronic pain syndrome 02/14/2010  . COLONIC POLYPS, HX OF 07/17/2008  . DIABETES MELLITUS, TYPE II 07/17/2008  . Dysuria 11/11/2009    . FOOT PAIN 07/17/2008  . GERD 07/17/2008  . HEARING LOSS, RIGHT EAR 06/04/2009  . HYPERKALEMIA 10/31/2009  . Hyperlipemia 09/24/2010  . Hyperlipidemia 09/24/2010  . HYPERSOMNIA 02/14/2010  . HYPERTENSION 07/17/2008  . HYPONATREMIA 03/20/2010  . Hypoxemia 03/20/2010  . IBS 06/04/2009  . MENOPAUSAL DISORDER 12/03/2009  . NEUROPATHY, HX OF 01/28/2009  . Osteoporosis 11/16/2014  . PERIPHERAL EDEMA 08/30/2008  . Pneumonia, aspiration ([place]) 12/01/2011   Episode July 2013, tx per [place]  . Pneumonia, organism unspecified(486) 02/14/2010  . RENAL INSUFFICIENCY 10/31/2009  . Rheumatoid arthritis(714.0) 07/17/2008  . Seizure ([place]) 10/26/2010  . SKIN LESION 06/04/2009  . SKIN ULCER, CHRONIC 02/08/2009  . Spinal stenosis of lumbar region 05/15/2015  . Trigeminal neuralgia 02/14/2010  . UNSPECIFIED HEPATITIS 01/28/2009  . Wheezing 12/03/2009   BP 95/60   Pulse 84   Temp (!) 97.5 F (36.4 C)   Wt 230 lb (104.3 kg)   SpO2 93%   BMI 33.00 kg/m   Opioid Risk Score:   Fall Risk Score:  `1  Depression screen PHQ 2/9  Depression screen [facility] 2/9 07/27/2018 04/07/2018 03/11/2018 01/05/2018 12/08/2017 08/19/2017 06/24/2017  Decreased Interest 1 1 1 1 1 1  0  Down, Depressed, Hopeless 1 1 1 1 1 1  0  PHQ - 2 Score 2 2 2 2 2 2  0  Altered sleeping - - - - - 0 -  Tired, decreased energy - - - - - 1 -  Change in appetite - - - - - 1 -  Feeling bad or failure about yourself  - - - - - 1 -  Trouble concentrating - - - - - 0 -  Moving slowly or fidgety/restless - - - - - 0 -  Suicidal thoughts - - - - - 0 -  PHQ-9 Score - - - - - 5 -  Difficult doing work/chores - - - - - - -  Some recent data might be hidden     Review of Systems  Genitourinary: Positive for difficulty urinating.  Musculoskeletal: Positive for arthralgias and gait problem.  Skin: Positive for rash.  All other systems reviewed and are negative.      Objective:   Physical Exam  Objective     Physical Exam General: No acute  distress, obese. Slid forward in chair to avoid buttock pressure HEENT: EOMI, oral membranes moist Cards: reg rate  Chest: normal effort Abdomen: Soft, NT, ND Skin: dry, intact Extremities: 1+ edema LLE, right bka stump still swollen   Neurological: Alert and oriented x3.  Decreased sensation to light touch in  left lower extremity below the knee.   UE's: 4/5 deltoids, biceps,triceps 4/5. LLE: 4/5 HF, 3+KE 2/5 ADF/PF. Motor exam stable today. Cognitively she's appropriate.   Skin: wound not visualized Psychiatric: Pleasant.        Assessment & Plan:  1.Functional deficits secondary to right below-knee amputation, history of phantom limb pain.:              -continue to work on mobility with [facility] PT         -Mobility Assessment  Crystal Mires was seen today for the purpose of a mobility assessment for a powered wheelchair. She suffers from weakness, decreased mobility related to right BKA, chronic pressure wounds, peripheral neuropathy, and lumbar spondylosis. Due to her multiple medical/functional deficits, she is unable to utilize a cane, walker, manual wheelchair, or scooter. The patient is appropriate for a customized power wheelchair..  With a power wheelchair  Crystal. Broce can move independently at a household level and on a limited basis in the community. The chair will also allow the patient to perform ADL's more easily. The patient is competent to operate the recommended chair on her own and is motivated to utilize the chair on a daily basis.       -referral  for w/c assessment was made  -Will also make referral for hospital bed too.           2. Peripheral neuropathy: continue gabapentin 600mg  TID         Continue Fentanyl Patch: continue at dose for now. Doing well on this lower dose. #10. Continue percocet 10/325 q6 prn #120.   -We will continue the controlled substance monitoring program, this consists of regular clinic visits, examinations, routine drug screening, pill counts as  well as use of Controlled Substance Reporting System. NCCSRS was reviewed today.       3. History of rheumatoid arthritis. Continue Current Medication and Exercise and heat Therapy.   4. Osteoarthritis of left knee: Continue with Voltaren gel/ Exercise and heat therapy.   5. Chronic cellulitis. left leg wound with associated edema/buttock wound:             -continue [facility] wound care clinic mgt when possible             -HH follow up        -power w/c should help this too 6. Lumbar Spondylosis: continue HEP.  7. Herpes Zoster outbreak:            - gabapentin  600mg  TID for neuropathic pain            - lidoderm patches   25 minutes of face to face patient care time were spent during this visit. All questions were encouraged and answered.  Follow up with NP in  About 6 weeks

## 2019-09-05 ENCOUNTER — Telehealth: Payer: Self-pay | Admitting: Family Medicine

## 2019-09-05 NOTE — Telephone Encounter (Signed)
Judeth Cornfield from [facility] is calling in stating that the pt had 3 new pressure ulcers on her bottom and they would like to see if they can use a catheter until her wounds heal.  They would like to use Aquacel, Duoderm,Tegaderm on the wounds and change it twice a week or when it gets dirty.  If you have any question you can reach [place] at [phone].

## 2019-09-05 NOTE — Telephone Encounter (Signed)
Please okay all these measures

## 2019-09-06 NOTE — Telephone Encounter (Signed)
Ok for treatment has been given.

## 2019-09-12 ENCOUNTER — Encounter: Payer: Self-pay | Admitting: Family Medicine

## 2019-09-19 NOTE — Telephone Encounter (Signed)
Noted. Will send to Dr. Fry as FYI 

## 2019-09-19 NOTE — Telephone Encounter (Signed)
Judeth Cornfield from [facility] stated they will have to discharge the pt due to 12 missed visits. They called to confirm a visit an she may or may not answer and they still go by her home and can hear her inside but the pt will not come to the door. She said they have to discharge the pt.   Judeth Cornfield can reached at [phone]

## 2019-09-22 ENCOUNTER — Telehealth: Payer: Self-pay | Admitting: *Deleted

## 2019-09-22 MED ORDER — CEPHALEXIN 250 MG PO CAPS: 250.0000 mg | ORAL_CAPSULE | Freq: Four times a day (QID) | ORAL | 0 refills | Status: AC

## 2019-09-22 NOTE — Telephone Encounter (Signed)
I would be happy to call in abx for cellulitis but Crystal Fitzpatrick need to follow up with wound care clinic as well.  She has been seeing them for some time for multiple wounds.  I'll send in rx for keflex 250mg  qid #28.

## 2019-09-22 NOTE — Telephone Encounter (Signed)
Judeth Cornfield from [facility] called and reports that Lakesa appears to have cellulitis maybe on both her stump and her left leg.  She was wondering if that is something you would call in an antibiotic for. Please advise.

## 2019-09-22 NOTE — Telephone Encounter (Addendum)
Judeth Cornfield called back to let us know about the concern they have over finding her attempting to place a second fentanyl patch on because her pain level was increased. They did not allow her to do it, but she insisted she needed it because she was in pain.. She also was chewing her percocet instead of swallowing the pills.They report that she has been appearing overly sedated when they arrive and find she often has wet the bed because of the sedation. I reported this to Dr. Riley Kill and he asked that a social worker be sent out to try and help her find a safer solution to her living situation. [facility] has had one out before, but will send her out again. Judeth Cornfield reports that they have actually reported her to APS before over their concern for her safety. It was determined that she was of sound mind and they could not force her into a different living situation as long as she is competent. Per [name], if she cannot be moved into a living situation where her medications are managed for her, we will no longer be able to provide pain medications.

## 2019-09-22 NOTE — Telephone Encounter (Signed)
I spoke with Jola Babinski and gave her the message.  She is so very thankful for [name]. She has an appt with the wound center but could not talk to them today because they closed early today.

## 2019-10-11 ENCOUNTER — Encounter: Payer: Medicare Other | Attending: Physical Medicine and Rehabilitation | Admitting: Physical Medicine & Rehabilitation

## 2019-10-11 ENCOUNTER — Other Ambulatory Visit: Payer: Self-pay

## 2019-10-11 ENCOUNTER — Encounter: Payer: Self-pay | Admitting: Physical Medicine & Rehabilitation

## 2019-10-11 VITALS — BP 139/72 | HR 81

## 2019-10-11 DIAGNOSIS — M179 Osteoarthritis of knee, unspecified: Secondary | ICD-10-CM | POA: Diagnosis present

## 2019-10-11 DIAGNOSIS — G894 Chronic pain syndrome: Secondary | ICD-10-CM | POA: Insufficient documentation

## 2019-10-11 DIAGNOSIS — M47816 Spondylosis without myelopathy or radiculopathy, lumbar region: Secondary | ICD-10-CM | POA: Diagnosis not present

## 2019-10-11 DIAGNOSIS — M1712 Unilateral primary osteoarthritis, left knee: Secondary | ICD-10-CM | POA: Diagnosis not present

## 2019-10-11 DIAGNOSIS — Z89519 Acquired absence of unspecified leg below knee: Secondary | ICD-10-CM | POA: Insufficient documentation

## 2019-10-11 DIAGNOSIS — L03116 Cellulitis of left lower limb: Secondary | ICD-10-CM | POA: Insufficient documentation

## 2019-10-11 DIAGNOSIS — G546 Phantom limb syndrome with pain: Secondary | ICD-10-CM | POA: Diagnosis not present

## 2019-10-11 DIAGNOSIS — M069 Rheumatoid arthritis, unspecified: Secondary | ICD-10-CM | POA: Insufficient documentation

## 2019-10-11 DIAGNOSIS — M4716 Other spondylosis with myelopathy, lumbar region: Secondary | ICD-10-CM | POA: Insufficient documentation

## 2019-10-11 DIAGNOSIS — G609 Hereditary and idiopathic neuropathy, unspecified: Secondary | ICD-10-CM | POA: Diagnosis not present

## 2019-10-11 MED ORDER — OXYCODONE-ACETAMINOPHEN 10-325 MG PO TABS: 1.0000 | ORAL_TABLET | Freq: Four times a day (QID) | ORAL | 0 refills | Status: DC | PRN

## 2019-10-11 MED ORDER — FENTANYL 12 MCG/HR TD PT72: 1.0000 | MEDICATED_PATCH | TRANSDERMAL | 0 refills | Status: DC

## 2019-10-11 NOTE — Progress Notes (Signed)
Subjective:    Patient ID: Crystal Fitzpatrick, female    DOB: 1945-10-10, 74 y.o.   MRN: [id number]  HPI Crystal Fitzpatrick is here in follow-up of her chronic pain.  Crystal Fitzpatrick has done fairly well with the decrease in her fentanyl patch.  However home health nursing called [place] over concerns of the patient safety.  They reported that the patient was oversedated and that she was placing a second fentanyl patch because her pain levels were increased.  The also noted that she was chewing a oxycodone tablet.  The patient vehemently denied this information and stated that one of the home health nurses was not reliable and was miss reporting information.  She stated that she had to fire the company as they were coming out to the house regularly and for some other inconsistencies.  She did not make an appointment with outpatient PT for her wheelchair assessment.  Per apparently they tried to call her but may have had a wrong number.  She ended up striking her left foot on something at home and developed a sore on the end of the second toe.  She is following up with wound care for x-rays and further management next week.  She denies any fever or excessive drainage.  In regards to her medications above she states that she has been trying to take her medications as prescribed.  She has been dealing with the taper of the fentanyl patch which we have been discussing and working on for a while.  Even though she has more pain, she is willing to decrease and wean off the patch ultimately.  Pain Inventory Average Pain 8 Pain Right Now 8 My pain is constant, sharp, burning, dull, stabbing, tingling and aching  In the last 24 hours, has pain interfered with the following? General activity 3 Relation with others 0 Enjoyment of life 0 What TIME of day is your pain at its worst? morning, night Sleep (in general) Fair  Pain is worse with: some activites and bedsores Pain improves with: therapy/exercise, pacing activities and  medication Relief from Meds: 6  Mobility use a wheelchair Do you have any goals in this area?  yes  Function disabled: date disabled .  Neuro/Psych bladder control problems trouble walking anxiety  Prior Studies Any changes since last visit?  no  Physicians involved in your care Any changes since last visit?  no   Family History  Problem Relation Age of Onset  . Breast cancer Sister   . Diabetes Other   . Stroke Other        Grandparents   Social History   Socioeconomic History  . Marital status: Divorced    Spouse name: Not on file  . Number of children: 1  . Years of education: Not on file  . Highest education level: Not on file  Occupational History  . Occupation: retired Oncologist: RETIRED  Tobacco Use  . Smoking status: Former Games developer  . Smokeless tobacco: Never Used  Substance and Sexual Activity  . Alcohol use: No    Alcohol/week: 0.0 standard drinks  . Drug use: No  . Sexual activity: Not on file  Other Topics Concern  . Not on file  Social History Narrative  . Not on file   Social Determinants of Health   Financial Resource Strain:   . Difficulty of Paying Living Expenses:   Food Insecurity:   . Worried About Programme researcher, broadcasting/film/video in the Last Year:   .  Ran Out of Food in the Last Year:   Transportation Needs:   . Freight forwarder (Medical):   [name] Lack of Transportation (Non-Medical):   Physical Activity:   . Days of Exercise per Week:   . Minutes of Exercise per Session:   Stress:   . Feeling of Stress :   Social Connections:   . Frequency of Communication with Friends and Family:   . Frequency of Social Gatherings with Friends and Family:   . Attends Religious Services:   . Active Member of Clubs or Organizations:   . Attends Banker Meetings:   [name] Marital Status:    Past Surgical History:  Procedure Laterality Date  . AMPUTATION  02/02/2008   below right knee  . foot surgury  1980  and 1981  . NASAL SEPTUM SURGERY    . 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 s/p Breast biopsy  1992  . s/p EGD  2007   with Botox for? Achalasia  . TONSILLECTOMY    . TUBAL LIGATION     Past Medical History:  Diagnosis Date  . ABDOMINAL DISTENSION 10/11/2008  . Acute gouty arthropathy 12/11/2008  . Altered mental status 02/07/2010  . AMPUTATION, BELOW KNEE, RIGHT, HX OF 01/28/2009  . ANAPHYLACTIC SHOCK 05/15/2009  . ANEMIA-NOS 07/17/2008  . ANXIETY 07/17/2008  . ARTHRITIS 01/28/2009  . B12 DEFICIENCY 07/17/2008  . Cellulitis and abscess of leg, except foot 11/15/2008  . CHF 01/28/2009  . Chronic pain syndrome 02/14/2010  . COLONIC POLYPS, HX OF 07/17/2008  . DIABETES MELLITUS, TYPE II 07/17/2008  . Dysuria 11/11/2009  . FOOT PAIN 07/17/2008  . GERD 07/17/2008  . HEARING LOSS, RIGHT EAR 06/04/2009  . HYPERKALEMIA 10/31/2009  . Hyperlipemia 09/24/2010  . Hyperlipidemia 09/24/2010  . HYPERSOMNIA 02/14/2010  . HYPERTENSION 07/17/2008  . HYPONATREMIA 03/20/2010  . Hypoxemia 03/20/2010  . IBS 06/04/2009  . MENOPAUSAL DISORDER 12/03/2009  . NEUROPATHY, HX OF 01/28/2009  . Osteoporosis 11/16/2014  . PERIPHERAL EDEMA 08/30/2008  . Pneumonia, aspiration ([facility]) 12/01/2011   Episode July 2013, tx per [facility]  . Pneumonia, organism unspecified(486) 02/14/2010  . RENAL INSUFFICIENCY 10/31/2009  . Rheumatoid arthritis(714.0) 07/17/2008  . Seizure ([facility]) 10/26/2010  . SKIN LESION 06/04/2009  . SKIN ULCER, CHRONIC 02/08/2009  . Spinal stenosis of lumbar region 05/15/2015  . Trigeminal neuralgia 02/14/2010  . UNSPECIFIED HEPATITIS 01/28/2009  . Wheezing 12/03/2009   BP 139/72   Pulse 81   SpO2 93%   Opioid Risk Score:   Fall Risk Score:  `1  Depression screen PHQ 2/9  Depression screen [facility] 2/9 07/27/2018 04/07/2018 03/11/2018 01/05/2018 12/08/2017 08/19/2017 06/24/2017  Decreased Interest 1 1 1 1 1 1  0  Down, Depressed, Hopeless 1 1 1 1 1 1  0  PHQ - 2 Score 2 2 2 2 2 2  0  Altered sleeping - - - - - 0  -  Tired, decreased energy - - - - - 1 -  Change in appetite - - - - - 1 -  Feeling bad or failure about yourself  - - - - - 1 -  Trouble concentrating - - - - - 0 -  Moving slowly or fidgety/restless - - - - - 0 -  Suicidal thoughts - - - - - 0 -  PHQ-9 Score - - - - - 5 -  Difficult doing work/chores - - - - - - -  Some recent data  might be hidden    Review of Systems  Constitutional: Negative.   HENT: Negative.   Eyes: Negative.   Respiratory: Negative.   Cardiovascular: Negative.   Gastrointestinal: Negative.   Endocrine: Negative.        Low blood sugar  Genitourinary: Positive for dysuria.  Musculoskeletal: Positive for back pain and gait problem.  Allergic/Immunologic: Negative.   Hematological: Negative.   Psychiatric/Behavioral: The patient is nervous/anxious.   All other systems reviewed and are negative.      Objective:   Physical Exam  General:  Patient alert and appropriate.  Has lost weight. HEENT:  EOMI Cards:  Regular rate Chest: normal effort Abdomen:  Soft nontender Skin: dry, intact Extremities: 1+ edema LLE, right bka stump still swollen but decreased in size   Neurological: Alert and oriented x3.  Cognitively appropriate. Decreased sensation to light touch in left lower extremity below the knee.   UE's: 4/5 deltoids, biceps,triceps 4/5. LLE: 4/5 HF, 3+KE 2/5 ADF/PF.   Motor exam is stable.  Skin: left 2nd toe with stage 2 1.5cm in diameter. Looks clean Psychiatric: Pleasant.        Assessment & Plan:  1.Functional deficits secondary to right below-knee amputation, history of phantom limb pain.:               -continue HH PT, RN              -await power potential power w/c.  Reached out to outpatient therapy and again so that they can connect with Leda Gauze          2. Peripheral neuropathy: continue gabapentin 600mg  TID         Continue Fentanyl Patch: reduce fentanyl to 10mcg patch.  I discussed again concerns over patient's safety, etc. for  reasons to decrease in wean off the patch ultimately.  She agrees  - Continue percocet 10/325 q6 prn #120.   -We will continue the controlled substance monitoring program, this consists of regular clinic visits, examinations, routine drug screening, pill counts as well as use of [place] Controlled Substance Reporting System. [place]CCS[place] was reviewed today.   -discussed safety with medication    3. History of rheumatoid arthritis. Continue Current Medication and Exercise and heat Therapy.   4. Osteoarthritis of left knee: Continue with Voltaren gel/ Exercise and heat therapy.   5. Chronic cellulitis. left leg wound with associated edema/buttock wound:             -NEW toe wound looks clean. She's going to [facility] next wk             -power w/c should help this too 6. Lumbar Spondylosis: continue HEP.  7. Herpes Zoster outbreak:            - gabapentin  600mg  TID for neuropathic pain.  May continue for now            - lidoderm patches--these are now over-the-counter  Fifteen minutes of face to face patient care time were spent during this visit. All questions were encouraged and answered.  Follow up with NP in a month. [name]

## 2019-10-11 NOTE — Patient Instructions (Signed)
PLEASE FEEL FREE TO CALL OUR OFFICE WITH ANY PROBLEMS OR QUESTIONS ([phone])      

## 2019-10-16 ENCOUNTER — Other Ambulatory Visit: Payer: Self-pay | Admitting: Physical Medicine & Rehabilitation

## 2019-10-16 DIAGNOSIS — M4716 Other spondylosis with myelopathy, lumbar region: Secondary | ICD-10-CM

## 2019-10-16 DIAGNOSIS — G609 Hereditary and idiopathic neuropathy, unspecified: Secondary | ICD-10-CM

## 2019-10-16 DIAGNOSIS — G546 Phantom limb syndrome with pain: Secondary | ICD-10-CM

## 2019-11-01 ENCOUNTER — Telehealth: Payer: Self-pay | Admitting: Family Medicine

## 2019-11-01 NOTE — Telephone Encounter (Signed)
Attempted to schedule AWV. Unable to LVM.  Will try at later time.  

## 2019-11-08 ENCOUNTER — Other Ambulatory Visit: Payer: Self-pay | Admitting: Registered Nurse

## 2019-11-10 ENCOUNTER — Other Ambulatory Visit: Payer: Self-pay | Admitting: Physical Medicine & Rehabilitation

## 2019-11-10 DIAGNOSIS — G609 Hereditary and idiopathic neuropathy, unspecified: Secondary | ICD-10-CM

## 2019-11-10 DIAGNOSIS — Z89519 Acquired absence of unspecified leg below knee: Secondary | ICD-10-CM

## 2019-11-10 DIAGNOSIS — M1712 Unilateral primary osteoarthritis, left knee: Secondary | ICD-10-CM

## 2019-11-10 DIAGNOSIS — G546 Phantom limb syndrome with pain: Secondary | ICD-10-CM

## 2019-11-10 DIAGNOSIS — M47816 Spondylosis without myelopathy or radiculopathy, lumbar region: Secondary | ICD-10-CM

## 2019-11-15 ENCOUNTER — Encounter: Payer: Medicare Other | Admitting: Physical Medicine & Rehabilitation

## 2019-11-30 ENCOUNTER — Other Ambulatory Visit: Payer: Self-pay | Admitting: Physical Medicine & Rehabilitation

## 2019-12-08 DIAGNOSIS — E785 Hyperlipidemia, unspecified: Secondary | ICD-10-CM

## 2019-12-08 DIAGNOSIS — Z9181 History of falling: Secondary | ICD-10-CM

## 2019-12-08 DIAGNOSIS — Z86010 Personal history of colonic polyps: Secondary | ICD-10-CM

## 2019-12-08 DIAGNOSIS — G894 Chronic pain syndrome: Secondary | ICD-10-CM

## 2019-12-08 DIAGNOSIS — E538 Deficiency of other specified B group vitamins: Secondary | ICD-10-CM

## 2019-12-08 DIAGNOSIS — D519 Vitamin B12 deficiency anemia, unspecified: Secondary | ICD-10-CM

## 2019-12-08 DIAGNOSIS — M810 Age-related osteoporosis without current pathological fracture: Secondary | ICD-10-CM

## 2019-12-08 DIAGNOSIS — H9190 Unspecified hearing loss, unspecified ear: Secondary | ICD-10-CM

## 2019-12-08 DIAGNOSIS — Z89511 Acquired absence of right leg below knee: Secondary | ICD-10-CM

## 2019-12-08 DIAGNOSIS — M109 Gout, unspecified: Secondary | ICD-10-CM

## 2019-12-08 DIAGNOSIS — M1990 Unspecified osteoarthritis, unspecified site: Secondary | ICD-10-CM

## 2019-12-08 DIAGNOSIS — Z8701 Personal history of pneumonia (recurrent): Secondary | ICD-10-CM

## 2019-12-08 DIAGNOSIS — F419 Anxiety disorder, unspecified: Secondary | ICD-10-CM

## 2019-12-08 DIAGNOSIS — G40909 Epilepsy, unspecified, not intractable, without status epilepticus: Secondary | ICD-10-CM

## 2019-12-08 DIAGNOSIS — Z993 Dependence on wheelchair: Secondary | ICD-10-CM

## 2019-12-08 DIAGNOSIS — L258 Unspecified contact dermatitis due to other agents: Secondary | ICD-10-CM | POA: Diagnosis not present

## 2019-12-08 DIAGNOSIS — E1142 Type 2 diabetes mellitus with diabetic polyneuropathy: Secondary | ICD-10-CM

## 2019-12-08 DIAGNOSIS — Z8673 Personal history of transient ischemic attack (TIA), and cerebral infarction without residual deficits: Secondary | ICD-10-CM

## 2019-12-08 DIAGNOSIS — K219 Gastro-esophageal reflux disease without esophagitis: Secondary | ICD-10-CM

## 2019-12-08 DIAGNOSIS — E1122 Type 2 diabetes mellitus with diabetic chronic kidney disease: Secondary | ICD-10-CM | POA: Diagnosis not present

## 2019-12-08 DIAGNOSIS — I509 Heart failure, unspecified: Secondary | ICD-10-CM | POA: Diagnosis not present

## 2019-12-08 DIAGNOSIS — K5900 Constipation, unspecified: Secondary | ICD-10-CM

## 2019-12-08 DIAGNOSIS — N183 Chronic kidney disease, stage 3 unspecified: Secondary | ICD-10-CM

## 2019-12-08 DIAGNOSIS — I130 Hypertensive heart and chronic kidney disease with heart failure and stage 1 through stage 4 chronic kidney disease, or unspecified chronic kidney disease: Secondary | ICD-10-CM

## 2019-12-08 DIAGNOSIS — K589 Irritable bowel syndrome without diarrhea: Secondary | ICD-10-CM

## 2019-12-08 DIAGNOSIS — Z79891 Long term (current) use of opiate analgesic: Secondary | ICD-10-CM

## 2019-12-08 DIAGNOSIS — Z87440 Personal history of urinary (tract) infections: Secondary | ICD-10-CM

## 2019-12-12 ENCOUNTER — Other Ambulatory Visit: Payer: Self-pay | Admitting: Physical Medicine & Rehabilitation

## 2019-12-12 ENCOUNTER — Telehealth: Payer: Self-pay | Admitting: Registered Nurse

## 2019-12-12 DIAGNOSIS — M47816 Spondylosis without myelopathy or radiculopathy, lumbar region: Secondary | ICD-10-CM

## 2019-12-12 DIAGNOSIS — G609 Hereditary and idiopathic neuropathy, unspecified: Secondary | ICD-10-CM

## 2019-12-12 DIAGNOSIS — G546 Phantom limb syndrome with pain: Secondary | ICD-10-CM

## 2019-12-12 DIAGNOSIS — M1712 Unilateral primary osteoarthritis, left knee: Secondary | ICD-10-CM

## 2019-12-12 DIAGNOSIS — Z89519 Acquired absence of unspecified leg below knee: Secondary | ICD-10-CM

## 2019-12-12 MED ORDER — OXYCODONE-ACETAMINOPHEN 10-325 MG PO TABS: 1.0000 | ORAL_TABLET | Freq: Four times a day (QID) | ORAL | 0 refills | Status: DC | PRN

## 2019-12-12 MED ORDER — FENTANYL 12 MCG/HR TD PT72: MEDICATED_PATCH | TRANSDERMAL | 0 refills | Status: DC

## 2019-12-12 NOTE — Telephone Encounter (Signed)
Patient states that she needs authorization for medication to be refilled at pharmacy. If a call can be made to her pharmacy she is out of medication today.  Medication Last issued 6/28.

## 2019-12-12 NOTE — Telephone Encounter (Signed)
PMP was Reviewed: Fentanyl and Oxycodone was last filled on 11/13/2019.  [name] note was reviewed, The goal is to wean off the [facility], Crystal Fitzpatrick states she has been taking her Fentanyl patch evert three days as prescribed, her last fill date was on 11/13/2019. She was instructed to begin a slow weaning of the fentanyl to every 4 days for one week then to decrease to one patch every 5 days for one week. Then placed the last patch on the 01/02/2020 Also instructed to keep a pain Journal and to call office on Tuesday August 3 rd with an update, regarding the above she verbalizes understanding.

## 2019-12-19 ENCOUNTER — Telehealth: Payer: Self-pay

## 2019-12-19 NOTE — Telephone Encounter (Signed)
Doing alright, gone 4 days on the patch instead of 3. Has been in more pain but tolerable.

## 2020-01-03 ENCOUNTER — Encounter: Payer: Medicare Other | Attending: Physical Medicine and Rehabilitation | Admitting: Physical Medicine & Rehabilitation

## 2020-01-03 ENCOUNTER — Other Ambulatory Visit: Payer: Self-pay

## 2020-01-03 ENCOUNTER — Encounter: Payer: Self-pay | Admitting: Physical Medicine & Rehabilitation

## 2020-01-03 VITALS — BP 133/73 | HR 88 | Temp 98.9°F | Ht 70.0 in | Wt 235.0 lb

## 2020-01-03 DIAGNOSIS — G894 Chronic pain syndrome: Secondary | ICD-10-CM | POA: Insufficient documentation

## 2020-01-03 DIAGNOSIS — G609 Hereditary and idiopathic neuropathy, unspecified: Secondary | ICD-10-CM | POA: Insufficient documentation

## 2020-01-03 DIAGNOSIS — M4716 Other spondylosis with myelopathy, lumbar region: Secondary | ICD-10-CM | POA: Insufficient documentation

## 2020-01-03 DIAGNOSIS — M47816 Spondylosis without myelopathy or radiculopathy, lumbar region: Secondary | ICD-10-CM | POA: Insufficient documentation

## 2020-01-03 DIAGNOSIS — L03116 Cellulitis of left lower limb: Secondary | ICD-10-CM | POA: Diagnosis present

## 2020-01-03 DIAGNOSIS — G546 Phantom limb syndrome with pain: Secondary | ICD-10-CM | POA: Insufficient documentation

## 2020-01-03 DIAGNOSIS — M179 Osteoarthritis of knee, unspecified: Secondary | ICD-10-CM | POA: Diagnosis present

## 2020-01-03 DIAGNOSIS — M069 Rheumatoid arthritis, unspecified: Secondary | ICD-10-CM | POA: Insufficient documentation

## 2020-01-03 DIAGNOSIS — M1712 Unilateral primary osteoarthritis, left knee: Secondary | ICD-10-CM | POA: Diagnosis present

## 2020-01-03 DIAGNOSIS — Z89519 Acquired absence of unspecified leg below knee: Secondary | ICD-10-CM | POA: Diagnosis present

## 2020-01-03 NOTE — Progress Notes (Signed)
Subjective:    Patient ID: Crystal Fitzpatrick, female    DOB: 01-13-1946, 74 y.o.   MRN: [id number]  HPI   Crystal Fitzpatrick is here in follow up of her chronic pain and functional mobility deficits.  She has come off fentanyl. She feels that her pain is worse now. She has taken oxycodone every 4 on some occasions.   She has had ongoing wounds in the LLE. She removes her ?unna dressing d/t bladder incontinence. She awaits w/c eval next week in [place]. She has a new friend who is helping her.   Her oxycodone/tylenol is 52m q6 prn.   She sees [facility] monthly.      Pain Inventory Average Pain 9 Pain Right Now 10 My pain is constant, sharp, dull, stabbing and aching  In the last 24 hours, has pain interfered with the following? General activity 10 Relation with others 10 Enjoyment of life 8 What TIME of day is your pain at its worst? morning  and night Sleep (in general) Poor  Pain is worse with: inactivity Pain improves with: medication Relief from Meds: 4  Family History  Problem Relation Age of Onset   Breast cancer Sister    Diabetes Other    Stroke Other        Grandparents   Social History   Socioeconomic History   Marital status: Divorced    Spouse name: Not on file   Number of children: 1   Years of education: Not on file   Highest education level: Not on file  Occupational History   Occupation: retired Oncologist: RETIRED  Tobacco Use   Smoking status: Former Smoker   Smokeless tobacco: Never Used  Building services engineer Use: Never used  Substance and Sexual Activity   Alcohol use: No    Alcohol/week: 0.0 standard drinks   Drug use: No   Sexual activity: Not Currently  Other Topics Concern   Not on file  Social History Narrative   Not on file   Social Determinants of Health   Financial Resource Strain:    Difficulty of Paying Living Expenses:   Food Insecurity:    Worried About Programme researcher, broadcasting/film/video in  the Last Year:    Barista in the Last Year:   Transportation Needs:    Freight forwarder (Medical):    Lack of Transportation (Non-Medical):   Physical Activity:    Days of Exercise per Week:    Minutes of Exercise per Session:   Stress:    Feeling of Stress :   Social Connections:    Frequency of Communication with Friends and Family:    Frequency of Social Gatherings with Friends and Family:    Attends Religious Services:    Active Member of Clubs or Organizations:    Attends Banker Meetings:    Marital Status:    Past Surgical History:  Procedure Laterality Date   AMPUTATION  02/02/2008   below right knee   foot surgury  1980 and 1981   NASAL SEPTUM SURGERY     s/p BKA  9/09   For DFU and Osteomyelitis   s/p Breast biopsy  1992   s/p EGD  2007   with Botox for? Achalasia   TONSILLECTOMY     TUBAL LIGATION     Past Surgical History:  Procedure Laterality Date   AMPUTATION  02/02/2008   below right knee   foot  surgury  1980 and 1981   NASAL SEPTUM SURGERY     s/p BKA  9/09   For DFU and Osteomyelitis   s/p Breast biopsy  1992   s/p EGD  2007   with Botox for? Achalasia   TONSILLECTOMY     TUBAL LIGATION     Past Medical History:  Diagnosis Date   ABDOMINAL DISTENSION 10/11/2008   Acute gouty arthropathy 12/11/2008   Altered mental status 02/07/2010   AMPUTATION, BELOW KNEE, RIGHT, HX OF 01/28/2009   ANAPHYLACTIC SHOCK 05/15/2009   ANEMIA-NOS 07/17/2008   ANXIETY 07/17/2008   ARTHRITIS 01/28/2009   B12 DEFICIENCY 07/17/2008   Cellulitis and abscess of leg, except foot 11/15/2008   CHF 01/28/2009   Chronic pain syndrome 02/14/2010   COLONIC POLYPS, HX OF 07/17/2008   DIABETES MELLITUS, TYPE II 07/17/2008   Dysuria 11/11/2009   FOOT PAIN 07/17/2008   GERD 07/17/2008   HEARING LOSS, RIGHT EAR 06/04/2009   HYPERKALEMIA 10/31/2009   Hyperlipemia 09/24/2010   Hyperlipidemia 09/24/2010   HYPERSOMNIA 02/14/2010     HYPERTENSION 07/17/2008   HYPONATREMIA 03/20/2010   Hypoxemia 03/20/2010   IBS 06/04/2009   MENOPAUSAL DISORDER 12/03/2009   NEUROPATHY, HX OF 01/28/2009   Osteoporosis 11/16/2014   PERIPHERAL EDEMA 08/30/2008   Pneumonia, aspiration (HCC) 12/01/2011   Episode July 2013, tx per [facility]   Pneumonia, organism unspecified(486) 02/14/2010   RENAL INSUFFICIENCY 10/31/2009   Rheumatoid arthritis(714.0) 07/17/2008   Seizure (HCC) 10/26/2010   SKIN LESION 06/04/2009   SKIN ULCER, CHRONIC 02/08/2009   Spinal stenosis of lumbar region 05/15/2015   Trigeminal neuralgia 02/14/2010   UNSPECIFIED HEPATITIS 01/28/2009   Wheezing 12/03/2009   BP 133/73    Pulse 88    Temp 98.9 F (37.2 C)    Ht 5\' 10"  (1.778 m)    Wt 235 lb (106.6 kg)    SpO2 93%    BMI 33.72 kg/m   Opioid Risk Score:   Fall Risk Score:  `1  Depression screen PHQ 2/9  Depression screen [facility] 2/9 07/27/2018 04/07/2018 03/11/2018 01/05/2018 12/08/2017 08/19/2017 06/24/2017  Decreased Interest 1 1 1 1 1 1  0  Down, Depressed, Hopeless 1 1 1 1 1 1  0  PHQ - 2 Score 2 2 2 2 2 2  0  Altered sleeping - - - - - 0 -  Tired, decreased energy - - - - - 1 -  Change in appetite - - - - - 1 -  Feeling bad or failure about yourself  - - - - - 1 -  Trouble concentrating - - - - - 0 -  Moving slowly or fidgety/restless - - - - - 0 -  Suicidal thoughts - - - - - 0 -  PHQ-9 Score - - - - - 5 -  Difficult doing work/chores - - - - - - -  Some recent data might be hidden   Review of Systems  Constitutional: Negative.   HENT: Negative.   Eyes: Negative.   Respiratory: Negative.   Cardiovascular: Positive for leg swelling.  Gastrointestinal: Negative.   Endocrine: Negative.   Genitourinary: Negative.   Musculoskeletal: Positive for back pain and gait problem.  Skin: Negative.   Allergic/Immunologic: Negative.   Neurological: Positive for numbness.  Hematological: Negative.   Psychiatric/Behavioral: Negative.         Objective:   Physical Exam  General:  Patient alert and appropriate.  Has lost weight. HEENT:  EOMI Cards:  Regular rate Chest: normal effort Abdomen:  Soft nontender Skin: dry, intact, chronic changes Extremities: 2+ edema LLE with chronic vasc changes, right bka stumpintact.    Neurological: Alert and oriented x3.  Cognitively appropriate. Decreased sensation to light touch in left lower extremity below the knee.   UE's: 4/5 deltoids, biceps,triceps 4/5. LLE: 4/5 HF, 3+KE 2/5 ADF/PF.   Motor exam is stable.  Psychiatric: Pleasant.        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 ?ALF  2. Peripheral neuropathy: continue gabapentin 600mg  TID         -conitnue percocet 10/325 #120, no fentanyl -We will continue the controlled substance monitoring program, this consists of regular clinic visits, examinations, routine drug screening, pill counts as well as use of [place] Controlled Substance Reporting System. [place] was [place] today.     -discussed meditation, TENS, other modailities for pain  3. History of rheumatoid arthritis. Continue Current Medication and Exercise and heat Therapy.   4. Osteoarthritis of left knee: Continue with Voltaren gel/ Exercise and heat therapy.   5. Chronic cellulitis. left leg wound with associated edema/buttock wound:             -unna dressing as advised 6. Lumbar Spondylosis: continue HEP.  7. Herpes Zoster outbreak:            - gabapentin  600mg  TID for neuropathic pain.  May continue for now            - lidoderm patches--these are now over-the-counter   15 of face to face patient care time were spent during this visit. All questions were encouraged and answered.  Follow up with NP next mo .

## 2020-01-03 NOTE — Patient Instructions (Addendum)
PLEASE FEEL FREE TO CALL OUR OFFICE WITH ANY PROBLEMS OR QUESTIONS [phone])  PLEASE LOOK INTO SOME OPTIONS REGARDING A MORE ASSISTED LIVING ENVIRONMENT.

## 2020-01-09 ENCOUNTER — Other Ambulatory Visit: Payer: Self-pay | Admitting: Registered Nurse

## 2020-01-09 DIAGNOSIS — G609 Hereditary and idiopathic neuropathy, unspecified: Secondary | ICD-10-CM

## 2020-01-09 DIAGNOSIS — Z89519 Acquired absence of unspecified leg below knee: Secondary | ICD-10-CM

## 2020-01-09 DIAGNOSIS — M47816 Spondylosis without myelopathy or radiculopathy, lumbar region: Secondary | ICD-10-CM

## 2020-01-09 DIAGNOSIS — M1712 Unilateral primary osteoarthritis, left knee: Secondary | ICD-10-CM

## 2020-01-10 ENCOUNTER — Telehealth: Payer: Self-pay | Admitting: Physical Medicine & Rehabilitation

## 2020-01-10 DIAGNOSIS — G609 Hereditary and idiopathic neuropathy, unspecified: Secondary | ICD-10-CM

## 2020-01-10 DIAGNOSIS — Z89519 Acquired absence of unspecified leg below knee: Secondary | ICD-10-CM

## 2020-01-10 DIAGNOSIS — M1712 Unilateral primary osteoarthritis, left knee: Secondary | ICD-10-CM

## 2020-01-10 DIAGNOSIS — M47816 Spondylosis without myelopathy or radiculopathy, lumbar region: Secondary | ICD-10-CM

## 2020-01-10 MED ORDER — OXYCODONE-ACETAMINOPHEN 10-325 MG PO TABS: 1.0000 | ORAL_TABLET | Freq: Four times a day (QID) | ORAL | 0 refills | Status: DC | PRN

## 2020-01-10 MED ORDER — DULOXETINE HCL 60 MG PO CPEP: 120.0000 mg | ORAL_CAPSULE | Freq: Every day | ORAL | 4 refills | Status: DC

## 2020-01-10 MED ORDER — TOPIRAMATE 100 MG PO TABS: ORAL_TABLET | ORAL | 4 refills | Status: DC

## 2020-01-10 NOTE — Telephone Encounter (Signed)
I called Crystal Fitzpatrick and refilled her meds including oxycodone

## 2020-01-10 NOTE — Telephone Encounter (Signed)
Crystal Fitzpatrick has questions regarding a medication refill would like to speak to someone today. Something about a patch that is missing. Did not want to be transferred to clinic line

## 2020-01-10 NOTE — Addendum Note (Signed)
Addended by: Faith Rogue T on: 01/10/2020 12:59 PM   Modules accepted: Orders

## 2020-02-05 ENCOUNTER — Other Ambulatory Visit: Payer: Self-pay | Admitting: Physical Medicine & Rehabilitation

## 2020-02-05 DIAGNOSIS — M1712 Unilateral primary osteoarthritis, left knee: Secondary | ICD-10-CM

## 2020-02-05 DIAGNOSIS — Z89519 Acquired absence of unspecified leg below knee: Secondary | ICD-10-CM

## 2020-02-05 DIAGNOSIS — G609 Hereditary and idiopathic neuropathy, unspecified: Secondary | ICD-10-CM

## 2020-02-05 DIAGNOSIS — M47816 Spondylosis without myelopathy or radiculopathy, lumbar region: Secondary | ICD-10-CM

## 2020-03-04 ENCOUNTER — Other Ambulatory Visit: Payer: Self-pay | Admitting: Physical Medicine & Rehabilitation

## 2020-03-04 DIAGNOSIS — M47816 Spondylosis without myelopathy or radiculopathy, lumbar region: Secondary | ICD-10-CM

## 2020-03-04 DIAGNOSIS — G609 Hereditary and idiopathic neuropathy, unspecified: Secondary | ICD-10-CM

## 2020-03-04 DIAGNOSIS — M1712 Unilateral primary osteoarthritis, left knee: Secondary | ICD-10-CM

## 2020-03-04 DIAGNOSIS — Z89519 Acquired absence of unspecified leg below knee: Secondary | ICD-10-CM

## 2020-03-05 NOTE — Telephone Encounter (Signed)
Mrs Ridings has been out since 6 am this morning.

## 2020-03-13 ENCOUNTER — Encounter: Payer: Medicare Other | Admitting: Physical Medicine & Rehabilitation

## 2020-03-18 ENCOUNTER — Other Ambulatory Visit: Payer: Self-pay | Admitting: Physical Medicine & Rehabilitation

## 2020-03-18 DIAGNOSIS — G609 Hereditary and idiopathic neuropathy, unspecified: Secondary | ICD-10-CM

## 2020-03-18 DIAGNOSIS — G546 Phantom limb syndrome with pain: Secondary | ICD-10-CM

## 2020-03-18 DIAGNOSIS — M4716 Other spondylosis with myelopathy, lumbar region: Secondary | ICD-10-CM

## 2020-03-30 ENCOUNTER — Other Ambulatory Visit: Payer: Self-pay | Admitting: Physical Medicine & Rehabilitation

## 2020-03-30 DIAGNOSIS — M47816 Spondylosis without myelopathy or radiculopathy, lumbar region: Secondary | ICD-10-CM

## 2020-03-30 DIAGNOSIS — M1712 Unilateral primary osteoarthritis, left knee: Secondary | ICD-10-CM

## 2020-03-30 DIAGNOSIS — G609 Hereditary and idiopathic neuropathy, unspecified: Secondary | ICD-10-CM

## 2020-03-30 DIAGNOSIS — Z89519 Acquired absence of unspecified leg below knee: Secondary | ICD-10-CM

## 2020-04-02 ENCOUNTER — Encounter: Payer: Self-pay | Admitting: *Deleted

## 2020-04-02 NOTE — Telephone Encounter (Signed)
PMP was Reviewed: Oxycodone e-scribed. Sybil RN spoke with Crystal Fitzpatrick earlier today.

## 2020-04-02 NOTE — Telephone Encounter (Signed)
Carely called and is very upset because she was out of her percocet as of yesterday.  Her last refill was 03/05/20 and her thirtieth day is 11/17 and she has an appt with [name],  She is teary and said she is trying but she is off her Fentanyl now and she cannot get the pain under control.  I have advised her that I spoke with Riley Lam and she will send in the refill but she must be cautious of how many pills she is taking. She said she is so depressed about not being able to get her pain under control and says she has never said that before. I reinforced that this is why she must keep her appt. With [name] tomorrow.  She reports that she will definitely be there, because she had to cancel her wound appt to come to his appt.

## 2020-04-02 NOTE — Telephone Encounter (Signed)
Opened in error

## 2020-04-03 ENCOUNTER — Other Ambulatory Visit: Payer: Self-pay

## 2020-04-03 ENCOUNTER — Encounter: Payer: Medicare Other | Admitting: Physical Medicine & Rehabilitation

## 2020-04-03 VITALS — Temp 99.8°F

## 2020-04-03 DIAGNOSIS — Z5181 Encounter for therapeutic drug level monitoring: Secondary | ICD-10-CM

## 2020-04-03 DIAGNOSIS — G894 Chronic pain syndrome: Secondary | ICD-10-CM

## 2020-04-03 DIAGNOSIS — Z79891 Long term (current) use of opiate analgesic: Secondary | ICD-10-CM

## 2020-04-03 NOTE — Progress Notes (Signed)
Temperature was over clinic threshold of 99.5 (she was 99.8x2)  Visit not completed. I have notified Apolinar Junes.

## 2020-04-08 ENCOUNTER — Other Ambulatory Visit: Payer: Self-pay | Admitting: Physical Medicine & Rehabilitation

## 2020-04-08 LAB — DRUG TOX MONITOR 1 W/CONF, ORAL FLD
Amphetamines: NEGATIVE ng/mL (ref <10)
Barbiturates: NEGATIVE ng/mL (ref <10)
Benzodiazepines: NEGATIVE ng/mL (ref <0.50)
Buprenorphine: NEGATIVE ng/mL (ref <0.10)
Cocaine: NEGATIVE ng/mL (ref <5.0)
Codeine: NEGATIVE ng/mL (ref <2.5)
Dihydrocodeine: NEGATIVE ng/mL (ref <2.5)
Fentanyl: NEGATIVE ng/mL (ref <0.10)
Heroin Metabolite: NEGATIVE ng/mL (ref <1.0)
Hydrocodone: NEGATIVE ng/mL (ref <2.5)
Hydromorphone: NEGATIVE ng/mL (ref <2.5)
MARIJUANA: NEGATIVE ng/mL (ref <2.5)
MDMA: NEGATIVE ng/mL (ref <10)
Meprobamate: NEGATIVE ng/mL (ref <2.5)
Methadone: NEGATIVE ng/mL (ref <5.0)
Morphine: NEGATIVE ng/mL (ref <2.5)
Nicotine Metabolite: NEGATIVE ng/mL (ref <5.0)
Norhydrocodone: NEGATIVE ng/mL (ref <2.5)
Noroxycodone: 40.7 ng/mL — ABNORMAL HIGH (ref <2.5)
Opiates: POSITIVE ng/mL — AB (ref <2.5)
Oxycodone: >250 ng/mL — ABNORMAL HIGH (ref <2.5)
Oxymorphone: NEGATIVE ng/mL (ref <2.5)
Phencyclidine: NEGATIVE ng/mL (ref <10)
Tapentadol: NEGATIVE ng/mL (ref <5.0)
Tramadol: NEGATIVE ng/mL (ref <5.0)
Zolpidem: NEGATIVE ng/mL (ref <5.0)

## 2020-04-08 LAB — DRUG TOX ALC METAB W/CON, ORAL FLD: Alcohol Metabolite: NEGATIVE ng/mL (ref <25)

## 2020-04-23 ENCOUNTER — Other Ambulatory Visit: Payer: Self-pay | Admitting: Physical Medicine & Rehabilitation

## 2020-04-30 ENCOUNTER — Other Ambulatory Visit: Payer: Self-pay | Admitting: Registered Nurse

## 2020-04-30 DIAGNOSIS — G609 Hereditary and idiopathic neuropathy, unspecified: Secondary | ICD-10-CM

## 2020-04-30 DIAGNOSIS — Z89519 Acquired absence of unspecified leg below knee: Secondary | ICD-10-CM

## 2020-04-30 DIAGNOSIS — M1712 Unilateral primary osteoarthritis, left knee: Secondary | ICD-10-CM

## 2020-04-30 DIAGNOSIS — M47816 Spondylosis without myelopathy or radiculopathy, lumbar region: Secondary | ICD-10-CM

## 2020-05-22 ENCOUNTER — Other Ambulatory Visit: Payer: Self-pay

## 2020-05-22 ENCOUNTER — Encounter: Payer: Self-pay | Admitting: Physical Medicine & Rehabilitation

## 2020-05-22 ENCOUNTER — Encounter: Payer: Medicare Other | Attending: Physical Medicine & Rehabilitation | Admitting: Physical Medicine & Rehabilitation

## 2020-05-22 VITALS — BP 121/69 | HR 91 | Temp 98.5°F

## 2020-05-22 DIAGNOSIS — G546 Phantom limb syndrome with pain: Secondary | ICD-10-CM | POA: Diagnosis present

## 2020-05-22 DIAGNOSIS — G894 Chronic pain syndrome: Secondary | ICD-10-CM | POA: Diagnosis present

## 2020-05-22 DIAGNOSIS — M47816 Spondylosis without myelopathy or radiculopathy, lumbar region: Secondary | ICD-10-CM

## 2020-05-22 DIAGNOSIS — M1712 Unilateral primary osteoarthritis, left knee: Secondary | ICD-10-CM

## 2020-05-22 DIAGNOSIS — G609 Hereditary and idiopathic neuropathy, unspecified: Secondary | ICD-10-CM | POA: Diagnosis present

## 2020-05-22 DIAGNOSIS — Z89519 Acquired absence of unspecified leg below knee: Secondary | ICD-10-CM | POA: Diagnosis present

## 2020-05-22 MED ORDER — OXYCODONE-ACETAMINOPHEN 10-325 MG PO TABS: 1.0000 | ORAL_TABLET | Freq: Four times a day (QID) | ORAL | 0 refills | Status: DC | PRN

## 2020-05-22 NOTE — Patient Instructions (Signed)
PLEASE FEEL FREE TO CALL OUR OFFICE WITH ANY PROBLEMS OR QUESTIONS ([phone])      

## 2020-05-22 NOTE — Progress Notes (Signed)
Subjective:    Patient ID: Crystal Fitzpatrick, female    DOB: Aug 24, 1945, 75 y.o.   MRN: [id number]  HPI   Patient is here today in follow-up of her chronic pain and gait disorder. The primary purpose of today's visit is for assessment for a power wheelchair assessment. Crystal Fitzpatrick is looking forward to a new chair so that she might regain some independence with her mobility, self-care and leisure activities  She continues to struggle with wounds in her left buttock and left heel. She sees the wound care clinic weekly and feels that she's making progress. Her left leg is in a unna wrap.   Her pain levels are fairly stable. She is off fentanyl. She is using percocet for pain control which provides her relief. She reports that her pain levels are higher off of fentanyl but that she's happy to be off the patch.   She is trying to eat better and has lost weight as a result.      Pain Inventory Average Pain 8 Pain Right Now 8 My pain is constant  In the last 24 hours, has pain interfered with the following? General activity 8 Relation with others 8 Enjoyment of life 8 What TIME of day is your pain at its worst? morning , daytime, evening and night Sleep (in general) Poor  Pain is worse with: unsure Pain improves with: medication Relief from Meds: varies  Family History  Problem Relation Age of Onset  . Breast cancer Sister   . Diabetes Other   . Stroke Other        Grandparents   Social History   Socioeconomic History  . Marital status: Divorced    Spouse name: Not on file  . Number of children: 1  . Years of education: Not on file  . Highest education level: Not on file  Occupational History  . Occupation: retired Oncologist: RETIRED  Tobacco Use  . Smoking status: Former Games developer  . Smokeless tobacco: Never Used  Vaping Use  . Vaping Use: Never used  Substance and Sexual Activity  . Alcohol use: No    Alcohol/week: 0.0 standard drinks  .  Drug use: No  . Sexual activity: Not Currently  Other Topics Concern  . Not on file  Social History Narrative  . Not on file   Social Determinants of Health   Financial Resource Strain: Not on file  Food Insecurity: Not on file  Transportation Needs: Not on file  Physical Activity: Not on file  Stress: Not on file  Social Connections: Not on file   Past Surgical History:  Procedure Laterality Date  . AMPUTATION  02/02/2008   below right knee  . foot surgury  1980 and 1981  . NASAL SEPTUM SURGERY    . 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 s/p Breast biopsy  1992  . s/p EGD  2007   with Botox for? Achalasia  . TONSILLECTOMY    . TUBAL LIGATION     Past Surgical History:  Procedure Laterality Date  . AMPUTATION  02/02/2008   below right knee  . foot surgury  1980 and 1981  . NASAL SEPTUM SURGERY    . 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 s/p Breast biopsy  1992  . s/p EGD  2007   with Botox for? Achalasia  . TONSILLECTOMY    . TUBAL LIGATION  Past Medical History:  Diagnosis Date  . ABDOMINAL DISTENSION 10/11/2008  . Acute gouty arthropathy 12/11/2008  . Altered mental status 02/07/2010  . AMPUTATION, BELOW KNEE, RIGHT, HX OF 01/28/2009  . ANAPHYLACTIC SHOCK 05/15/2009  . ANEMIA-NOS 07/17/2008  . ANXIETY 07/17/2008  . ARTHRITIS 01/28/2009  . B12 DEFICIENCY 07/17/2008  . Cellulitis and abscess of leg, except foot 11/15/2008  . CHF 01/28/2009  . Chronic pain syndrome 02/14/2010  . COLONIC POLYPS, HX OF 07/17/2008  . DIABETES MELLITUS, TYPE II 07/17/2008  . Dysuria 11/11/2009  . FOOT PAIN 07/17/2008  . GERD 07/17/2008  . HEARING LOSS, RIGHT EAR 06/04/2009  . HYPERKALEMIA 10/31/2009  . Hyperlipemia 09/24/2010  . Hyperlipidemia 09/24/2010  . HYPERSOMNIA 02/14/2010  . HYPERTENSION 07/17/2008  . HYPONATREMIA 03/20/2010  . Hypoxemia 03/20/2010  . IBS 06/04/2009  . MENOPAUSAL DISORDER 12/03/2009  . NEUROPATHY, HX OF 01/28/2009  . Osteoporosis 11/16/2014  . PERIPHERAL EDEMA  08/30/2008  . Pneumonia, aspiration ([place]) 12/01/2011   Episode July 2013, tx per [place]  . Pneumonia, organism unspecified(486) 02/14/2010  . RENAL INSUFFICIENCY 10/31/2009  . Rheumatoid arthritis(714.0) 07/17/2008  . Seizure ([place]) 10/26/2010  . SKIN LESION 06/04/2009  . SKIN ULCER, CHRONIC 02/08/2009  . Spinal stenosis of lumbar region 05/15/2015  . Trigeminal neuralgia 02/14/2010  . UNSPECIFIED HEPATITIS 01/28/2009  . Wheezing 12/03/2009   BP 121/69   Pulse 91   Temp 98.5 F (36.9 C)   SpO2 96%   Opioid Risk Score:   Fall Risk Score:  `1  Depression screen PHQ 2/9  Depression screen [facility] 2/9 07/27/2018 04/07/2018 03/11/2018 01/05/2018 12/08/2017 08/19/2017 06/24/2017  Decreased Interest 1 1 1 1 1 1  0  Down, Depressed, Hopeless 1 1 1 1 1 1  0  PHQ - 2 Score 2 2 2 2 2 2  0  Altered sleeping - - - - - 0 -  Tired, decreased energy - - - - - 1 -  Change in appetite - - - - - 1 -  Feeling bad or failure about yourself  - - - - - 1 -  Trouble concentrating - - - - - 0 -  Moving slowly or fidgety/restless - - - - - 0 -  Suicidal thoughts - - - - - 0 -  PHQ-9 Score - - - - - 5 -  Difficult doing work/chores - - - - - - -  Some recent data might be hidden    Review of Systems  Constitutional: Negative.   HENT: Negative.   Eyes: Negative.   Respiratory: Negative.   Endocrine: Negative.   Genitourinary: Negative.   Musculoskeletal: Positive for back pain.  Neurological: Negative.   Hematological: Negative.   Psychiatric/Behavioral: Negative.   All other systems reviewed and are negative.      Objective:   Physical Exam General: No acute distress, looks bright. Color is better! HEENT: EOMI, oral membranes moist Cards: reg rate  Chest: normal effort Abdomen: Soft, NT, ND  Skin: unna dressing LLE. Buttocks not viewed Extremities: 1-2+ edema LLE with chronic vasc changes, right bka stump intact.   Neurological: very Alert and oriented x3.  Cognitively appropriate.  Decreased sensation to light touch in left lower extremity below the knee.   UE's: 4/5 deltoids, biceps,triceps 4/5. LLE: 4/5 HF, 3+KE 2/5 ADF/PF.   Motor exam is stable.  Psychiatric:pleasant as always.        Assessment & Plan:  1.Functional deficits secondary to right below-knee amputation, history of phantom  limb pain.:               Mobility Assessment  Crystal Fitzpatrick was seen today for the purpose of a mobility assessment for a powered wheelchair. I have reviewed and agree with the detailed PT evaluation. She suffers from gait deficits and pain related to peripheral neuropathy, left BKA, and polyarthropathy.  She also deals with chronic skin breakdown in the buttocks and left lower extremitiy which have been stage II-III at times.  .She is unable to utilize a cane, walker, manual wheelchair, or scooter. The patient is appropriate for a customized power wheelchair. Specifically, the patient requires a power wheelchair with a skin protection cushion.  With a power wheelchair  she can move independently at a household level and on a limited basis in the community. The chair will also allow the patient to perform ADL's such as toileting, dressing, hygiene, and simple meal prep which she otherwise is unable to do on her own without a power chair. The patient is competent to operate the recommended chair on her own and is motivated to utilize the chair on a daily basis. Weight is 107kg    2. Peripheral neuropathy: continue gabapentin 600mg  TID         -conitnue percocet 10/325 #120, RF today  -pt is now off fentanyl -We will continue the controlled substance monitoring program, this consists of regular clinic visits, examinations, routine drug screening, pill counts as well as use of 10-19-1981 Controlled Substance Reporting System. [place] was [place] today.           -TENS, other modailities for pain  3. History of rheumatoid arthritis. Continue Current Medication and Exercise and heat Therapy.    4. Osteoarthritis of left knee: voltaren and HEP 5. Chronic cellulitis with eft leg wound with associated edema/buttock wound:             -wound care clinic is following.  6. Lumbar Spondylosis: continue HEP.  7. Herpes Zoster outbreak: seems improved            - gabapentin  600mg  TID for neuropathic pain.  May continue for now            - lidoderm patches--these are now over-the-counter  Fifteen minutes of face to face patient care time were spent during this visit. All questions were encouraged and answered.  Follow up with me in 2 mos .

## 2020-06-06 ENCOUNTER — Other Ambulatory Visit: Payer: Self-pay | Admitting: Physical Medicine & Rehabilitation

## 2020-06-10 ENCOUNTER — Telehealth: Payer: Self-pay | Admitting: Family Medicine

## 2020-06-10 NOTE — Telephone Encounter (Signed)
Spoke with pt she was on another line and will call back to  schedule Medicare Annual Wellness Visit (AWV) either virtually or in office.   Last AWV no information  please schedule at anytime with [facility] Nurse Health Advisor 1 or 2   This should be a 45 minute visit. Patient needs appointment with PCP last appointment 09/07/17

## 2020-07-05 ENCOUNTER — Other Ambulatory Visit: Payer: Self-pay | Admitting: Physical Medicine & Rehabilitation

## 2020-07-22 ENCOUNTER — Other Ambulatory Visit: Payer: Self-pay

## 2020-07-22 DIAGNOSIS — M1712 Unilateral primary osteoarthritis, left knee: Secondary | ICD-10-CM

## 2020-07-22 DIAGNOSIS — M47816 Spondylosis without myelopathy or radiculopathy, lumbar region: Secondary | ICD-10-CM

## 2020-07-22 DIAGNOSIS — G609 Hereditary and idiopathic neuropathy, unspecified: Secondary | ICD-10-CM

## 2020-07-22 DIAGNOSIS — Z89519 Acquired absence of unspecified leg below knee: Secondary | ICD-10-CM

## 2020-07-22 MED ORDER — OXYCODONE-ACETAMINOPHEN 10-325 MG PO TABS: 1.0000 | ORAL_TABLET | Freq: Four times a day (QID) | ORAL | 0 refills | Status: DC | PRN

## 2020-07-22 NOTE — Telephone Encounter (Signed)
PMP was Reviewed. Oxycodone e-scribed today. Robbin CMA informed Ms. Lamadrid .

## 2020-07-22 NOTE — Telephone Encounter (Signed)
error 

## 2020-07-22 NOTE — Addendum Note (Signed)
Addended by: Jones Bales on: 07/22/2020 04:45 PM   Modules accepted: Orders

## 2020-07-31 ENCOUNTER — Encounter: Payer: Medicare Other | Admitting: Physical Medicine & Rehabilitation

## 2020-08-06 ENCOUNTER — Other Ambulatory Visit: Payer: Self-pay | Admitting: Physical Medicine & Rehabilitation

## 2020-08-16 ENCOUNTER — Other Ambulatory Visit: Payer: Self-pay | Admitting: Physical Medicine & Rehabilitation

## 2020-08-16 DIAGNOSIS — Z89519 Acquired absence of unspecified leg below knee: Secondary | ICD-10-CM

## 2020-08-16 DIAGNOSIS — G609 Hereditary and idiopathic neuropathy, unspecified: Secondary | ICD-10-CM

## 2020-08-16 DIAGNOSIS — M47816 Spondylosis without myelopathy or radiculopathy, lumbar region: Secondary | ICD-10-CM

## 2020-08-16 DIAGNOSIS — M1712 Unilateral primary osteoarthritis, left knee: Secondary | ICD-10-CM

## 2020-08-28 ENCOUNTER — Encounter: Payer: Medicare Other | Attending: Physical Medicine & Rehabilitation | Admitting: Physical Medicine & Rehabilitation

## 2020-08-28 ENCOUNTER — Encounter: Payer: Self-pay | Admitting: Physical Medicine & Rehabilitation

## 2020-08-28 ENCOUNTER — Other Ambulatory Visit: Payer: Self-pay

## 2020-08-28 VITALS — BP 156/76 | HR 95 | Temp 98.9°F

## 2020-08-28 DIAGNOSIS — G894 Chronic pain syndrome: Secondary | ICD-10-CM | POA: Insufficient documentation

## 2020-08-28 DIAGNOSIS — M1712 Unilateral primary osteoarthritis, left knee: Secondary | ICD-10-CM | POA: Diagnosis present

## 2020-08-28 DIAGNOSIS — G546 Phantom limb syndrome with pain: Secondary | ICD-10-CM | POA: Diagnosis present

## 2020-08-28 DIAGNOSIS — Z89519 Acquired absence of unspecified leg below knee: Secondary | ICD-10-CM | POA: Diagnosis present

## 2020-08-28 DIAGNOSIS — Z79891 Long term (current) use of opiate analgesic: Secondary | ICD-10-CM | POA: Insufficient documentation

## 2020-08-28 DIAGNOSIS — M47816 Spondylosis without myelopathy or radiculopathy, lumbar region: Secondary | ICD-10-CM | POA: Insufficient documentation

## 2020-08-28 DIAGNOSIS — Z5181 Encounter for therapeutic drug level monitoring: Secondary | ICD-10-CM | POA: Diagnosis present

## 2020-08-28 DIAGNOSIS — G609 Hereditary and idiopathic neuropathy, unspecified: Secondary | ICD-10-CM | POA: Diagnosis present

## 2020-08-28 MED ORDER — OXYCODONE-ACETAMINOPHEN 10-325 MG PO TABS: 1.0000 | ORAL_TABLET | Freq: Four times a day (QID) | ORAL | 0 refills | Status: DC | PRN

## 2020-08-28 NOTE — Progress Notes (Signed)
Subjective:    Patient ID: Crystal Fitzpatrick, female    DOB: [birth year], 75 y.o.   MRN: [id number]  HPI Crystal Fitzpatrick is here in follow up of her chronic pain. She has had "flares" from time to time , most recently in her left foot which has burning and stinging last week. The right foot proceeded to bother her from a phantom standpoint with progression to back pain and headache.   She maintains follow up at the wound care clinica and her left leg is improving. She has no dressing on it today.   She remains on percocet for her baseline pain control. She has remained stable off fentanyl as far as her pain scores are concerned.   She takes topamax and gabapentin for phantom limb pain and peripheral neuropathy    Pain Inventory Average Pain 8 Pain Right Now 7 My pain is intermittent, constant, sharp, burning, stabbing and aching  In the last 24 hours, has pain interfered with the following? General activity 7 Relation with others 9 Enjoyment of life 6 What TIME of day is your pain at its worst? morning  and night Sleep (in general) Fair  Pain is worse with: walking, bending, sitting, inactivity, standing and some activites Pain improves with: rest, heat/ice, therapy/exercise, pacing activities and medication Relief from Meds: 9  Family History  Problem Relation Age of Onset  . Breast cancer Sister   . Diabetes Other   . Stroke Other        Grandparents   Social History   Socioeconomic History  . Marital status: Divorced    Spouse name: Not on file  . Number of children: 1  . Years of education: Not on file  . Highest education level: Not on file  Occupational History  . Occupation: retired Oncologist: RETIRED  Tobacco Use  . Smoking status: Former Games developer  . Smokeless tobacco: Never Used  Vaping Use  . Vaping Use: Never used  Substance and Sexual Activity  . Alcohol use: No    Alcohol/week: 0.0 standard drinks  . Drug use: No  . Sexual  activity: Not Currently  Other Topics Concern  . Not on file  Social History Narrative  . Not on file   Social Determinants of Health   Financial Resource Strain: Not on file  Food Insecurity: Not on file  Transportation Needs: Not on file  Physical Activity: Not on file  Stress: Not on file  Social Connections: Not on file   Past Surgical History:  Procedure Laterality Date  . AMPUTATION  02/02/2008   below right knee  . foot surgury  1980 and 1981  . NASAL SEPTUM SURGERY    . 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 s/p Breast biopsy  1992  . s/p EGD  2007   with Botox for? Achalasia  . TONSILLECTOMY    . TUBAL LIGATION     Past Surgical History:  Procedure Laterality Date  . AMPUTATION  02/02/2008   below right knee  . foot surgury  1980 and 1981  . NASAL SEPTUM SURGERY    . 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 s/p Breast biopsy  1992  . s/p EGD  2007   with Botox for? Achalasia  . TONSILLECTOMY    . TUBAL LIGATION     Past Medical History:  Diagnosis Date  . ABDOMINAL DISTENSION 10/11/2008  . Acute gouty  arthropathy 12/11/2008  . Altered mental status 02/07/2010  . AMPUTATION, BELOW KNEE, RIGHT, HX OF 01/28/2009  . ANAPHYLACTIC SHOCK 05/15/2009  . ANEMIA-NOS 07/17/2008  . ANXIETY 07/17/2008  . ARTHRITIS 01/28/2009  . B12 DEFICIENCY 07/17/2008  . Cellulitis and abscess of leg, except foot 11/15/2008  . CHF 01/28/2009  . Chronic pain syndrome 02/14/2010  . COLONIC POLYPS, HX OF 07/17/2008  . DIABETES MELLITUS, TYPE II 07/17/2008  . Dysuria 11/11/2009  . FOOT PAIN 07/17/2008  . GERD 07/17/2008  . HEARING LOSS, RIGHT EAR 06/04/2009  . HYPERKALEMIA 10/31/2009  . Hyperlipemia 09/24/2010  . Hyperlipidemia 09/24/2010  . HYPERSOMNIA 02/14/2010  . HYPERTENSION 07/17/2008  . HYPONATREMIA 03/20/2010  . Hypoxemia 03/20/2010  . IBS 06/04/2009  . MENOPAUSAL DISORDER 12/03/2009  . NEUROPATHY, HX OF 01/28/2009  . Osteoporosis 11/16/2014  . PERIPHERAL EDEMA 08/30/2008  . Pneumonia,  aspiration (HCC) 12/01/2011   Episode July 2013, tx per [facility]  . Pneumonia, organism unspecified(486) 02/14/2010  . RENAL INSUFFICIENCY 10/31/2009  . Rheumatoid arthritis(714.0) 07/17/2008  . Seizure (HCC) 10/26/2010  . SKIN LESION 06/04/2009  . SKIN ULCER, CHRONIC 02/08/2009  . Spinal stenosis of lumbar region 05/15/2015  . Trigeminal neuralgia 02/14/2010  . UNSPECIFIED HEPATITIS 01/28/2009  . Wheezing 12/03/2009   BP (!) 156/76   Pulse 95   Temp 98.9 F (37.2 C)   SpO2 91%   Opioid Risk Score:   Fall Risk Score:  `1  Depression screen PHQ 2/9  Depression screen [facility] 2/9 07/27/2018 04/07/2018 03/11/2018 01/05/2018 12/08/2017 08/19/2017 06/24/2017  Decreased Interest 1 1 1 1 1 1  0  Down, Depressed, Hopeless 1 1 1 1 1 1  0  PHQ - 2 Score 2 2 2 2 2 2  0  Altered sleeping - - - - - 0 -  Tired, decreased energy - - - - - 1 -  Change in appetite - - - - - 1 -  Feeling bad or failure about yourself  - - - - - 1 -  Trouble concentrating - - - - - 0 -  Moving slowly or fidgety/restless - - - - - 0 -  Suicidal thoughts - - - - - 0 -  PHQ-9 Score - - - - - 5 -  Difficult doing work/chores - - - - - - -  Some recent data might be hidden    Review of Systems  Constitutional: Negative.   HENT: Negative.   Eyes: Negative.   Respiratory: Negative.   Cardiovascular: Negative.   Gastrointestinal: Negative.   Endocrine: Negative.   Genitourinary: Negative.   Musculoskeletal: Positive for arthralgias, back pain, gait problem and myalgias.  Skin: Negative.   Allergic/Immunologic: Negative.   Hematological: Negative.   Psychiatric/Behavioral: Negative.   All other systems reviewed and are negative.      Objective:   Physical Exam  General: No acute distress HEENT: EOMI, oral membranes moist Cards: reg rate  Chest: normal effort Abdomen: Soft, NT, ND Skin: dry, intact, a couple open lesions LLE.  Extremities:  Edema improved LLE. Right stump well shaped. Psych: pleasant  and appropriate Neuro:  Alert and oriented x 3. Normal insight and awareness. Intact Memory. Normal language and speech. Cranial nerve exam unremarkable.  Decreased sensation to light touch in left leg below knee.   UE's: 4/5 deltoids, biceps,triceps 4/5. LLE: 4/5 HF, 3+KE 2-3/5 ADF/PF.             Assessment & Plan:  1.Functional deficits secondary to right  below-knee amputation, history of phantom limb pain.:             -need to follow up on powered w/c. Hasn't received yet    2. Peripheral neuropathy: continue gabapentin 600mg  TID         -conitnue percocet 10/325 #120, RF today       --We will continue the controlled substance monitoring program, this consists of regular clinic visits, examinations, routine drug screening, pill counts as well as use of [place] Controlled Substance Reporting System. [place] was reviewed today.    -reviewed opioid hypersensitivity today  -Medication was refilled and a second prescription was sent to the patient's pharmacy for next month.   3. History of rheumatoid arthritis. Continue Current Medication and Exercise and heat Therapy.   4. Osteoarthritis of left knee: voltaren and HEP 5. Chronic cellulitis with eft leg wound with associated edema/buttock wound:             -wound care clinic is progressing with her wounds  6. Lumbar Spondylosis: continue HEP.  7. Herpes Zoster outbreak: seems improved            - gabapentin  600mg  TID             -     Fifteen minutes of face to face patient care time were spent during this visit. All questions were encouraged and answered.  Follow up with NP in 2 mos .

## 2020-08-28 NOTE — Patient Instructions (Addendum)
PLEASE FEEL FREE TO CALL OUR OFFICE WITH ANY PROBLEMS OR QUESTIONS [phone])  WHEN YOUR PAIN INCREASES, WORK ON WAYS TO DISTRACT YOUR MIND FROM YOUR PAIN: MUSIC, TV, READING HOBBY, SOCIAL INTERACTION, SPIRITUAL, MEDITATION OR MINDFULNESS TECHNIQUES.

## 2020-09-02 LAB — DRUG TOX MONITOR 1 W/CONF, ORAL FLD
Amphetamines: NEGATIVE ng/mL (ref <10)
Barbiturates: NEGATIVE ng/mL (ref <10)
Benzodiazepines: NEGATIVE ng/mL (ref <0.50)
Buprenorphine: NEGATIVE ng/mL (ref <0.10)
Cocaine: NEGATIVE ng/mL (ref <5.0)
Codeine: NEGATIVE ng/mL (ref <2.5)
Dihydrocodeine: NEGATIVE ng/mL (ref <2.5)
Fentanyl: NEGATIVE ng/mL (ref <0.10)
Hydrocodone: NEGATIVE ng/mL (ref <2.5)
Hydromorphone: NEGATIVE ng/mL (ref <2.5)
MARIJUANA: NEGATIVE ng/mL (ref <2.5)
MDMA: NEGATIVE ng/mL (ref <10)
Meprobamate: NEGATIVE ng/mL (ref <2.5)
Methadone: NEGATIVE ng/mL (ref <5.0)
Morphine: NEGATIVE ng/mL (ref <2.5)
Nicotine Metabolite: NEGATIVE ng/mL (ref <5.0)
Norhydrocodone: NEGATIVE ng/mL (ref <2.5)
Noroxycodone: 28.6 ng/mL — ABNORMAL HIGH (ref <2.5)
Opiates: POSITIVE ng/mL — AB (ref <2.5)
Oxycodone: >250 ng/mL — ABNORMAL HIGH (ref <2.5)
Oxymorphone: NEGATIVE ng/mL (ref <2.5)
Phencyclidine: NEGATIVE ng/mL (ref <10)
Tapentadol: NEGATIVE ng/mL (ref <5.0)
Tramadol: NEGATIVE ng/mL (ref <5.0)
Zolpidem: NEGATIVE ng/mL (ref <5.0)

## 2020-09-02 LAB — DRUG TOX ALC METAB W/CON, ORAL FLD: Alcohol Metabolite: NEGATIVE ng/mL (ref <25)

## 2020-09-06 ENCOUNTER — Telehealth: Payer: Self-pay | Admitting: *Deleted

## 2020-09-06 NOTE — Telephone Encounter (Signed)
Oral swab drug screen was consistent for prescribed medications.  ?

## 2020-09-09 ENCOUNTER — Other Ambulatory Visit: Payer: Self-pay | Admitting: Physical Medicine & Rehabilitation

## 2020-09-09 DIAGNOSIS — G609 Hereditary and idiopathic neuropathy, unspecified: Secondary | ICD-10-CM

## 2020-09-09 DIAGNOSIS — M4716 Other spondylosis with myelopathy, lumbar region: Secondary | ICD-10-CM

## 2020-09-09 DIAGNOSIS — G546 Phantom limb syndrome with pain: Secondary | ICD-10-CM

## 2020-10-17 ENCOUNTER — Telehealth: Payer: Self-pay | Admitting: Family Medicine

## 2020-10-17 NOTE — Telephone Encounter (Signed)
Left message for patient to call back and schedule Medicare Annual Wellness Visit (AWV) either virtually or in office.   awvi per [facility] 01/16/10  please schedule at anytime with [facility] Nurse Health Advisor 1 or 2  Patient also needs appointment with PCP last appointment 09/07/17   This should be a 45 minute visit.

## 2020-10-28 ENCOUNTER — Ambulatory Visit: Payer: Medicare Other | Admitting: Registered Nurse

## 2020-10-30 ENCOUNTER — Encounter: Payer: Medicare Other | Admitting: Physical Medicine & Rehabilitation

## 2020-11-06 ENCOUNTER — Telehealth: Payer: Self-pay

## 2020-11-06 ENCOUNTER — Other Ambulatory Visit: Payer: Self-pay | Admitting: Physical Medicine & Rehabilitation

## 2020-11-06 DIAGNOSIS — G609 Hereditary and idiopathic neuropathy, unspecified: Secondary | ICD-10-CM

## 2020-11-06 DIAGNOSIS — Z89519 Acquired absence of unspecified leg below knee: Secondary | ICD-10-CM

## 2020-11-06 DIAGNOSIS — M47816 Spondylosis without myelopathy or radiculopathy, lumbar region: Secondary | ICD-10-CM

## 2020-11-06 DIAGNOSIS — M1712 Unilateral primary osteoarthritis, left knee: Secondary | ICD-10-CM

## 2020-11-06 MED ORDER — OXYCODONE-ACETAMINOPHEN 10-325 MG PO TABS: 1.0000 | ORAL_TABLET | Freq: Four times a day (QID) | ORAL | 0 refills | Status: DC | PRN

## 2020-11-06 NOTE — Telephone Encounter (Signed)
Patient states she will be out of oxycodone by Friday she doesn't have an appointment until August she did miss her appt in June per pmp last filled was 10/14/20 .

## 2020-11-06 NOTE — Telephone Encounter (Signed)
PMP was reviewed: Last Oxycodone was filled on 10/14/2020. Administrator staff asked to call Ms. Calica to schedule appointment with [name] in July. Oxycodone e-scribed today. Clinical staff will call Ms. Hoopingarner regarding the above.

## 2020-11-26 ENCOUNTER — Telehealth: Payer: Self-pay | Admitting: Family Medicine

## 2020-11-26 NOTE — Telephone Encounter (Signed)
Tried calling patient to  schedule Medicare Annual Wellness Visit (AWV) either virtually or in office.  No answer     awvi per [facility] 01/16/10  please schedule at anytime with [facility] Nurse Health Advisor 1 or 2   This should be a 45 minute visit.

## 2020-12-04 ENCOUNTER — Other Ambulatory Visit: Payer: Self-pay | Admitting: Registered Nurse

## 2020-12-04 DIAGNOSIS — M1712 Unilateral primary osteoarthritis, left knee: Secondary | ICD-10-CM

## 2020-12-04 DIAGNOSIS — Z89519 Acquired absence of unspecified leg below knee: Secondary | ICD-10-CM

## 2020-12-04 DIAGNOSIS — M47816 Spondylosis without myelopathy or radiculopathy, lumbar region: Secondary | ICD-10-CM

## 2020-12-04 DIAGNOSIS — G609 Hereditary and idiopathic neuropathy, unspecified: Secondary | ICD-10-CM

## 2020-12-05 NOTE — Telephone Encounter (Signed)
PMP was Reviewed> Oxycodone e-scribed today with a post date

## 2020-12-26 ENCOUNTER — Telehealth: Payer: Self-pay | Admitting: Family Medicine

## 2020-12-26 NOTE — Telephone Encounter (Signed)
Tried calling patient to  schedule Medicare Annual Wellness Visit (AWV) either virtually or in office.  No answer     awvi per [facility] 01/16/10  please schedule at anytime with [facility] Nurse Health Advisor 1 or 2   This should be a 45 minute visit.  

## 2021-01-15 ENCOUNTER — Encounter: Payer: Medicare Other | Attending: Physical Medicine & Rehabilitation | Admitting: Physical Medicine & Rehabilitation

## 2021-02-05 ENCOUNTER — Telehealth: Payer: Self-pay | Admitting: Family Medicine

## 2021-02-05 NOTE — Telephone Encounter (Signed)
Please call Amber Poplin from [facility] to get clarification on what is listed below  Amber called to advise that she was giving orders from a skilled nursing facility to come out and see the PT in her home. They are unable to admit her due to a unsafe environment and she needs more then home health.   Please advise and call for clarification.

## 2021-02-06 ENCOUNTER — Other Ambulatory Visit: Payer: Self-pay

## 2021-02-06 DIAGNOSIS — Z89519 Acquired absence of unspecified leg below knee: Secondary | ICD-10-CM

## 2021-02-06 DIAGNOSIS — M47816 Spondylosis without myelopathy or radiculopathy, lumbar region: Secondary | ICD-10-CM

## 2021-02-06 DIAGNOSIS — M1712 Unilateral primary osteoarthritis, left knee: Secondary | ICD-10-CM

## 2021-02-06 DIAGNOSIS — G609 Hereditary and idiopathic neuropathy, unspecified: Secondary | ICD-10-CM

## 2021-02-06 MED ORDER — OXYCODONE-ACETAMINOPHEN 10-325 MG PO TABS: ORAL_TABLET | ORAL | 0 refills | Status: DC

## 2021-02-06 NOTE — Telephone Encounter (Signed)
Crystal Fitzpatrick wanted to thank you for the electric wheelchair.  She reported that she has been in the hospital for the past few months. And she has enough pain medication for October but will need a refill in November. (Oxycodone 10-325 MG).

## 2021-02-06 NOTE — Telephone Encounter (Signed)
I agree. She needs skilled nursing care every day

## 2021-02-06 NOTE — Telephone Encounter (Signed)
Lowry Ram and spoke with Joice Lofts, she stated that when the patient was discharged from skilled nursing rehab facility order was given for home health to go out to the patients home.    When the nurse arrived at the patient home it took her around 10 minutes to answer door due to limited ability to move around safely in her home, it was a lot of clutter on the floor.    When home health arrived patient informed them that she had been sitting in urine for around 24 hours, due to not being  able to bear weight on her leg.      Nurse Amber made a report to Adult Protective Services due to the  patient needing 24 hour care such as an Assisted Living and with home health only coming  in for one day a week for thirty minutes she needs higher level of care.

## 2021-02-10 ENCOUNTER — Other Ambulatory Visit: Payer: Self-pay | Admitting: Physical Medicine & Rehabilitation

## 2021-02-24 ENCOUNTER — Other Ambulatory Visit: Payer: Self-pay | Admitting: Physical Medicine & Rehabilitation

## 2021-02-24 DIAGNOSIS — M4716 Other spondylosis with myelopathy, lumbar region: Secondary | ICD-10-CM

## 2021-02-24 DIAGNOSIS — G609 Hereditary and idiopathic neuropathy, unspecified: Secondary | ICD-10-CM

## 2021-02-24 DIAGNOSIS — G546 Phantom limb syndrome with pain: Secondary | ICD-10-CM

## 2021-02-27 ENCOUNTER — Other Ambulatory Visit: Payer: Self-pay | Admitting: Physical Medicine & Rehabilitation

## 2021-02-27 DIAGNOSIS — L03116 Cellulitis of left lower limb: Secondary | ICD-10-CM

## 2021-03-17 ENCOUNTER — Other Ambulatory Visit: Payer: Self-pay | Admitting: Physical Medicine & Rehabilitation

## 2021-03-17 DIAGNOSIS — Z89519 Acquired absence of unspecified leg below knee: Secondary | ICD-10-CM

## 2021-03-17 DIAGNOSIS — M1712 Unilateral primary osteoarthritis, left knee: Secondary | ICD-10-CM

## 2021-03-17 DIAGNOSIS — G609 Hereditary and idiopathic neuropathy, unspecified: Secondary | ICD-10-CM

## 2021-03-17 DIAGNOSIS — M47816 Spondylosis without myelopathy or radiculopathy, lumbar region: Secondary | ICD-10-CM

## 2021-04-16 ENCOUNTER — Encounter: Payer: Medicare Other | Admitting: Physical Medicine & Rehabilitation

## 2021-04-16 ENCOUNTER — Telehealth: Payer: Self-pay

## 2021-04-16 DIAGNOSIS — G609 Hereditary and idiopathic neuropathy, unspecified: Secondary | ICD-10-CM

## 2021-04-16 DIAGNOSIS — M47816 Spondylosis without myelopathy or radiculopathy, lumbar region: Secondary | ICD-10-CM

## 2021-04-16 DIAGNOSIS — Z89519 Acquired absence of unspecified leg below knee: Secondary | ICD-10-CM

## 2021-04-16 DIAGNOSIS — M1712 Unilateral primary osteoarthritis, left knee: Secondary | ICD-10-CM

## 2021-04-16 MED ORDER — OXYCODONE-ACETAMINOPHEN 10-325 MG PO TABS: ORAL_TABLET | ORAL | 0 refills | Status: DC

## 2021-04-16 NOTE — Telephone Encounter (Signed)
Requesting medication refill on oxycodone per pmp last filled , 03/17/2021 . She was not able to make it to her appt today due to transportation issues so she has been rescheduled .

## 2021-04-16 NOTE — Telephone Encounter (Signed)
Rx sent 

## 2021-04-29 ENCOUNTER — Other Ambulatory Visit: Payer: Self-pay | Admitting: Physical Medicine & Rehabilitation

## 2021-04-29 DIAGNOSIS — L03116 Cellulitis of left lower limb: Secondary | ICD-10-CM

## 2021-04-29 NOTE — Telephone Encounter (Signed)
This rx needs to be refilled in future by MD caring for Crystal Fitzpatrick's wounds

## 2021-05-10 ENCOUNTER — Other Ambulatory Visit: Payer: Self-pay | Admitting: Physical Medicine & Rehabilitation

## 2021-05-15 ENCOUNTER — Other Ambulatory Visit: Payer: Self-pay | Admitting: Physical Medicine & Rehabilitation

## 2021-05-15 DIAGNOSIS — M1712 Unilateral primary osteoarthritis, left knee: Secondary | ICD-10-CM

## 2021-05-15 DIAGNOSIS — G609 Hereditary and idiopathic neuropathy, unspecified: Secondary | ICD-10-CM

## 2021-05-15 DIAGNOSIS — Z89519 Acquired absence of unspecified leg below knee: Secondary | ICD-10-CM

## 2021-05-15 DIAGNOSIS — M47816 Spondylosis without myelopathy or radiculopathy, lumbar region: Secondary | ICD-10-CM

## 2021-05-15 NOTE — Telephone Encounter (Signed)
Per PMP, last fill 04/16/21

## 2021-05-16 NOTE — Telephone Encounter (Signed)
I let Crystal Fitzpatrick know that no refills without appt. Her scheduled appt (1st available ) is in March. It looks like you havent seen her since April of last year. She has a new electric wheelchair and has to have transportation scheduled to bring her.

## 2021-06-04 ENCOUNTER — Other Ambulatory Visit: Payer: Self-pay | Admitting: Physical Medicine & Rehabilitation

## 2021-06-04 DIAGNOSIS — M4716 Other spondylosis with myelopathy, lumbar region: Secondary | ICD-10-CM

## 2021-06-04 DIAGNOSIS — G546 Phantom limb syndrome with pain: Secondary | ICD-10-CM

## 2021-06-04 DIAGNOSIS — G609 Hereditary and idiopathic neuropathy, unspecified: Secondary | ICD-10-CM

## 2021-06-11 ENCOUNTER — Other Ambulatory Visit: Payer: Self-pay | Admitting: Physical Medicine & Rehabilitation

## 2021-06-11 DIAGNOSIS — G609 Hereditary and idiopathic neuropathy, unspecified: Secondary | ICD-10-CM

## 2021-06-11 DIAGNOSIS — M1712 Unilateral primary osteoarthritis, left knee: Secondary | ICD-10-CM

## 2021-06-11 DIAGNOSIS — M47816 Spondylosis without myelopathy or radiculopathy, lumbar region: Secondary | ICD-10-CM

## 2021-06-11 DIAGNOSIS — Z89519 Acquired absence of unspecified leg below knee: Secondary | ICD-10-CM

## 2021-06-12 ENCOUNTER — Telehealth: Payer: Self-pay

## 2021-06-12 NOTE — Telephone Encounter (Signed)
PA for Oxycodone/APAP submitted. 

## 2021-06-12 NOTE — Telephone Encounter (Signed)
Approved through 05/17/2022. °

## 2021-07-08 ENCOUNTER — Other Ambulatory Visit: Payer: Self-pay | Admitting: Physical Medicine & Rehabilitation

## 2021-07-08 ENCOUNTER — Other Ambulatory Visit: Payer: Self-pay | Admitting: Family Medicine

## 2021-07-14 ENCOUNTER — Other Ambulatory Visit: Payer: Self-pay

## 2021-07-14 ENCOUNTER — Other Ambulatory Visit: Payer: Self-pay | Admitting: Physical Medicine & Rehabilitation

## 2021-07-14 DIAGNOSIS — G609 Hereditary and idiopathic neuropathy, unspecified: Secondary | ICD-10-CM

## 2021-07-14 DIAGNOSIS — M1712 Unilateral primary osteoarthritis, left knee: Secondary | ICD-10-CM

## 2021-07-14 DIAGNOSIS — Z89519 Acquired absence of unspecified leg below knee: Secondary | ICD-10-CM

## 2021-07-14 DIAGNOSIS — M47816 Spondylosis without myelopathy or radiculopathy, lumbar region: Secondary | ICD-10-CM

## 2021-07-14 MED ORDER — OXYCODONE-ACETAMINOPHEN 10-325 MG PO TABS: 1.0000 | ORAL_TABLET | Freq: Four times a day (QID) | ORAL | 0 refills | Status: DC | PRN

## 2021-07-14 NOTE — Telephone Encounter (Signed)
Dr. Riley Kill is not in the office & off line:   Crystal Fitzpatrick is down to one pill of the Oxycodone 10-325 MG. Per patient her power was out on Friday and she could not call in the request. Will you please send her refill?  Call back phone [phone].

## 2021-07-14 NOTE — Telephone Encounter (Signed)
PMP was Reviewed.  Oxycodone e-scribed today.  Placed a call to Ms. Mallek regarding the above, she verbalizes understanding.

## 2021-07-16 ENCOUNTER — Encounter: Payer: Medicare Other | Attending: Physical Medicine & Rehabilitation | Admitting: Physical Medicine & Rehabilitation

## 2021-07-16 ENCOUNTER — Other Ambulatory Visit: Payer: Self-pay

## 2021-07-16 ENCOUNTER — Encounter: Payer: Self-pay | Admitting: Physical Medicine & Rehabilitation

## 2021-07-16 VITALS — BP 134/80 | HR 78 | Ht 70.0 in

## 2021-07-16 DIAGNOSIS — G894 Chronic pain syndrome: Secondary | ICD-10-CM | POA: Diagnosis present

## 2021-07-16 DIAGNOSIS — M1712 Unilateral primary osteoarthritis, left knee: Secondary | ICD-10-CM | POA: Diagnosis present

## 2021-07-16 DIAGNOSIS — Z5181 Encounter for therapeutic drug level monitoring: Secondary | ICD-10-CM | POA: Diagnosis present

## 2021-07-16 DIAGNOSIS — M7581 Other shoulder lesions, right shoulder: Secondary | ICD-10-CM | POA: Insufficient documentation

## 2021-07-16 DIAGNOSIS — G546 Phantom limb syndrome with pain: Secondary | ICD-10-CM | POA: Insufficient documentation

## 2021-07-16 DIAGNOSIS — Z79891 Long term (current) use of opiate analgesic: Secondary | ICD-10-CM | POA: Insufficient documentation

## 2021-07-16 DIAGNOSIS — M47816 Spondylosis without myelopathy or radiculopathy, lumbar region: Secondary | ICD-10-CM | POA: Diagnosis present

## 2021-07-16 DIAGNOSIS — G609 Hereditary and idiopathic neuropathy, unspecified: Secondary | ICD-10-CM | POA: Diagnosis present

## 2021-07-16 NOTE — Progress Notes (Signed)
? ?Subjective:  ? ? Patient ID: Crystal Fitzpatrick, female    DOB: Dec 11, 1945, 76 y.o.   MRN: [id number] ? ?HPI ? ?O[place]ane is here in follow up of her chronic pain. She received her power chair over last summer which has been very helpful for her. She was hospitalized last year and was in a SNF for a period of time. It has been since last April since I've seen her in person. She is here today with her son.  ? ?She had a fall at the end of the summer when she returned from the SNF. She noticed that since she fell, her right shoulder has been bothering her. She is unable to lift the arm up over her head or reach out in front of her. The pain seems to be progressively worsening. She is not taking anything new for pain.  ? ?FOr pain she remains on percocet for pain control. 10/325 every 6 hours as needed. Additionally she takes cymbalta and gabapentin ? ?She has also has had urinary frequency and a uti recently for which she's completing a course of amoxil. She has seen urology in the past. She's off all diuretic currently. ? ? ? ?Pain Inventory ?Average Pain 5 ?Pain Right Now 9 ?My pain is intermittent, sharp, burning, dull, and aching ? ?In the last 24 hours, has pain interfered with the following? ?General activity 5 ?Relation with others 5 ?Enjoyment of life 5 ?What TIME of day is your pain at its worst? morning  ?Sleep (in general) Good ? ?Pain is worse with: unsure ?Pain improves with: heat/ice, therapy/exercise, and medication ?Relief from Meds: 5 ? ?Family History  ?Problem Relation Age of Onset  ? Breast cancer Sister   ? Diabetes Other   ? Stroke Other   ?     Grandparents  ? ?Social History  ? ?Socioeconomic History  ? Marital status: Divorced  ?  Spouse name: Not on file  ? Number of children: 1  ? Years of education: Not on file  ? Highest education level: Not on file  ?Occupational History  ? Occupation: retired Marketing executive  ?  Employer: RETIRED  ?Tobacco Use  ? Smoking status: Former  ?  Smokeless tobacco: Never  ?Vaping Use  ? Vaping Use: Never used  ?Substance and Sexual Activity  ? Alcohol use: No  ?  Alcohol/week: 0.0 standard drinks  ? Drug use: No  ? Sexual activity: Not Currently  ?Other Topics Concern  ? Not on file  ?Social History Narrative  ? Not on file  ? ?Social Determinants of Health  ? ?Financial Resource Strain: Not on file  ?Food Insecurity: Not on file  ?Transportation Needs: Not on file  ?Physical Activity: Not on file  ?Stress: Not on file  ?Social Connections: Not on file  ? ?Past Surgical History:  ?Procedure Laterality Date  ? AMPUTATION  02/02/2008  ? below right knee  ? foot surgury  1980 and 1981  ? NASAL SEPTUM SURGERY    ? s/p BKA  9/09  ? For DFU and Osteomyelitis  ? s/p Breast biopsy  1992  ? s/p EGD  2007  ? with Botox for? Achalasia  ? TONSILLECTOMY    ? TUBAL LIGATION    ? ?Past Surgical History:  ?Procedure Laterality Date  ? AMPUTATION  02/02/2008  ? below right knee  ? foot surgury  1980 and 1981  ? NASAL SEPTUM SURGERY    ? s/p BKA  9/09  ? For  DFU and Osteomyelitis  ? s/p Breast biopsy  1992  ? s/p EGD  2007  ? with Botox for? Achalasia  ? TONSILLECTOMY    ? TUBAL LIGATION    ? ?Past Medical History:  ?Diagnosis Date  ? ABDOMINAL DISTENSION 10/11/2008  ? Acute gouty arthropathy 12/11/2008  ? Altered mental status 02/07/2010  ? AMPUTATION, BELOW KNEE, RIGHT, HX OF 01/28/2009  ? ANAPHYLACTIC SHOCK 05/15/2009  ? ANEMIA-NOS 07/17/2008  ? ANXIETY 07/17/2008  ? ARTHRITIS 01/28/2009  ? B12 DEFICIENCY 07/17/2008  ? Cellulitis and abscess of leg, except foot 11/15/2008  ? CHF 01/28/2009  ? Chronic pain syndrome 02/14/2010  ? COLONIC POLYPS, HX OF 07/17/2008  ? DIABETES MELLITUS, TYPE II 07/17/2008  ? Dysuria 11/11/2009  ? FOOT PAIN 07/17/2008  ? GERD 07/17/2008  ? HEARING LOSS, RIGHT EAR 06/04/2009  ? HYPERKALEMIA 10/31/2009  ? Hyperlipemia 09/24/2010  ? Hyperlipidemia 09/24/2010  ? HYPERSOMNIA 02/14/2010  ? HYPERTENSION 07/17/2008  ? HYPONATREMIA 03/20/2010  ? Hypoxemia 03/20/2010  ? IBS 06/04/2009  ?  MENOPAUSAL DISORDER 12/03/2009  ? NEUROPATHY, HX OF 01/28/2009  ? Osteoporosis 11/16/2014  ? PERIPHERAL EDEMA 08/30/2008  ? Pneumonia, aspiration (HCC) 12/01/2011  ? Episode July 2013, tx per [facility]  ? Pneumonia, organism unspecified(486) 02/14/2010  ? RENAL INSUFFICIENCY 10/31/2009  ? Rheumatoid arthritis(714.0) 07/17/2008  ? Seizure (HCC) 10/26/2010  ? SKIN LESION 06/04/2009  ? SKIN ULCER, CHRONIC 02/08/2009  ? Spinal stenosis of lumbar region 05/15/2015  ? Trigeminal neuralgia 02/14/2010  ? UNSPECIFIED HEPATITIS 01/28/2009  ? Wheezing 12/03/2009  ? ?BP 134/80   Pulse 78   Ht 5\' 10"  (1.778 m)   SpO2 93%   BMI 33.72 kg/m?  ? ?Opioid Risk Score:   ?Fall Risk Score:  `1 ? ?Depression screen PHQ 2/9 ? ?[facility] 2[facility] 07/27/2018 04/07/2018 03/11/2018 01/05/2018 12/08/2017 08/19/2017 06/24/2017  ?Decreased Interest 1 1 1 1 1 1  0  ?Down, Depressed, Hopeless 1 1 1 1 1 1  0  ?PHQ - 2 Score 2 2 2 2 2 2  0  ?Altered sleeping - - - - - 0 -  ?Tired, decreased energy - - - - - 1 -  ?Change in appetite - - - - - 1 -  ?Feeling bad or failure about yourself  - - - - - 1 -  ?Trouble concentrating - - - - - 0 -  ?Moving slowly or fidgety/restless - - - - - 0 -  ?Suicidal thoughts - - - - - 0 -  ?PHQ-9 Score - - - - - 5 -  ?Difficult doing work/chores - - - - - - -  ?Some recent data might be hidden  ?  ? ?Review of Systems  ?Constitutional: Negative.   ?HENT: Negative.    ?Eyes: Negative.   ?Respiratory: Negative.    ?Cardiovascular: Negative.   ?Gastrointestinal: Negative.   ?Endocrine: Negative.   ?Genitourinary: Negative.   ?Musculoskeletal:  Positive for gait problem.  ?Skin: Negative.   ?Allergic/Immunologic: Negative.   ?Neurological:  Positive for weakness.  ?Hematological: Negative.   ?Psychiatric/Behavioral: Negative.    ? ?   ?Objective:  ? Physical Exam ? ?General: No acute distress, obese ?HEENT: NCAT, EOMI, oral membranes moist ?Cards: reg rate  ?Chest: normal effort ?Abdomen: Soft, NT, ND ?Skin: dry, intact,  BLE dressed.  ?Extremities: no edema ?Psych: pleasant and appropriate  ?Neuro:  Alert and oriented x 3. Normal insight and awareness. Intact Memory. Normal language and speech. Cranial nerve exam  unremarkable.  Decreased sensation to light touch in left leg below knee.   UE's: 4/5 deltoids, biceps,triceps 4/5. LLE: 4/5 HF, 3+KE 2-3/5 ADF/PF.    ?Musc: right shoulder with mild impingement signs and pain along subacromial/subdeltoid bursae with palpation   ?  ?  ?  ?Assessment & Plan:  ?1.Functional deficits secondary to right below-knee amputation, history of phantom limb pain.:  ?           -powered w/c very beneficial from a mobility standpoint   ? 2. Peripheral neuropathy: continue gabapentin  TID   ?      -conitnue percocet 10/325 #120, RF today ?      --We will continue the controlled substance monitoring program, this consists of regular clinic visits, examinations, routine drug screening, pill counts as well as use of [place] Controlled Substance Reporting System. [place] was [place] today.    ?      -drug swab today ?       ?3. History of rheumatoid arthritis. Continue Current Medication and Exercise and heat Therapy.   ?4. Osteoarthritis of left knee: voltaren and HEP ?5. Chronic cellulitis with eft leg wound with associated edema/buttock wound: ?            -pt  needs f/u with wound care clinic about ongoing skin breakdown ?6. Lumbar Spondylosis: continue HEP.  ?7. Herpes Zoster outbreak: resolve ?8. Right rotator cuff tendonitis       ?-provided HEP with simple exercises to start with ?-discussed importance of good posture and body mechanics as well ?-consider formal therapy if neeed ? ?      ?  Fifteen minutes of face to face patient care time were spent during this visit. All questions were encouraged and answered.  Follow up with NP in 2 mos .  ? ? ? ?    ? ? ?

## 2021-07-16 NOTE — Progress Notes (Deleted)
Established Patient Office Visit  Subjective:  Patient ID: Crystal Fitzpatrick, female    DOB: March 27, 1946  Age: 76 y.o. MRN: [id number]  CC:  Chief Complaint  Patient presents with   Follow-up    HPI Crystal Fitzpatrick presents for ***  Past Medical History:  Diagnosis Date   ABDOMINAL DISTENSION 10/11/2008   Acute gouty arthropathy 12/11/2008   Altered mental status 02/07/2010   AMPUTATION, BELOW KNEE, RIGHT, HX OF 01/28/2009   ANAPHYLACTIC SHOCK 05/15/2009   ANEMIA-NOS 07/17/2008   ANXIETY 07/17/2008   ARTHRITIS 01/28/2009   B12 DEFICIENCY 07/17/2008   Cellulitis and abscess of leg, except foot 11/15/2008   CHF 01/28/2009   Chronic pain syndrome 02/14/2010   COLONIC POLYPS, HX OF 07/17/2008   DIABETES MELLITUS, TYPE II 07/17/2008   Dysuria 11/11/2009   FOOT PAIN 07/17/2008   GERD 07/17/2008   HEARING LOSS, RIGHT EAR 06/04/2009   HYPERKALEMIA 10/31/2009   Hyperlipemia 09/24/2010   Hyperlipidemia 09/24/2010   HYPERSOMNIA 02/14/2010   HYPERTENSION 07/17/2008   HYPONATREMIA 03/20/2010   Hypoxemia 03/20/2010   IBS 06/04/2009   MENOPAUSAL DISORDER 12/03/2009   NEUROPATHY, HX OF 01/28/2009   Osteoporosis 11/16/2014   PERIPHERAL EDEMA 08/30/2008   Pneumonia, aspiration (HCC) 12/01/2011   Episode July 2013, tx per [facility]   Pneumonia, organism unspecified(486) 02/14/2010   RENAL INSUFFICIENCY 10/31/2009   Rheumatoid arthritis(714.0) 07/17/2008   Seizure (HCC) 10/26/2010   SKIN LESION 06/04/2009   SKIN ULCER, CHRONIC 02/08/2009   Spinal stenosis of lumbar region 05/15/2015   Trigeminal neuralgia 02/14/2010   UNSPECIFIED HEPATITIS 01/28/2009   Wheezing 12/03/2009    Past Surgical History:  Procedure Laterality Date   AMPUTATION  02/02/2008   below right knee   foot surgury  1980 and 1981   NASAL SEPTUM SURGERY     s/p BKA  9/09   For DFU and Osteomyelitis   s/p Breast biopsy  1992   s/p EGD  2007   with Botox for? Achalasia   TONSILLECTOMY     TUBAL LIGATION      Family History  Problem  Relation Age of Onset   Breast cancer Sister    Diabetes Other    Stroke Other        Grandparents    Social History   Socioeconomic History   Marital status: Divorced    Spouse name: Not on file   Number of children: 1   Years of education: Not on file   Highest education level: Not on file  Occupational History   Occupation: retired Oncologist: RETIRED  Tobacco Use   Smoking status: Former   Smokeless tobacco: Never  Building services engineer Use: Never used  Substance and Sexual Activity   Alcohol use: No    Alcohol/week: 0.0 standard drinks   Drug use: No   Sexual activity: Not Currently  Other Topics Concern   Not on file  Social History Narrative   Not on file   Social Determinants of Health   Financial Resource Strain: Not on file  Food Insecurity: Not on file  Transportation Needs: Not on file  Physical Activity: Not on file  Stress: Not on file  Social Connections: Not on file  Intimate Partner Violence: Not on file    Outpatient Medications Prior to Visit  Medication Sig Dispense Refill   atorvastatin (LIPITOR) 10 MG tablet Take 1 tablet by mouth daily.     Blood Glucose Monitoring  Suppl (ONE TOUCH ULTRA 2) W/DEVICE KIT 1 Device by Does not apply route once. 1 each 0   Cholecalciferol (VITAMIN D3 PO) TAKE 1 CAPSULE BY MOUTH EACH DAY     ciprofloxacin (CIPRO) 500 MG tablet Take 500 mg by mouth 2 (two) times daily.     clopidogrel (PLAVIX) 75 MG tablet Take 1 tablet by mouth daily.     cyclobenzaprine (FLEXERIL) 10 MG tablet TAKE 1 TABLET BY MOUTH EVERY 8 HOURS AS NEEDED FOR MUSCLE SPASMS 60 tablet 2   diclofenac sodium (VOLTAREN) 1 % GEL Apply topically.     diphenoxylate-atropine (LOMOTIL) 2.5-0.025 MG tablet Take by mouth.     DULoxetine (CYMBALTA) 60 MG capsule TAKE 2 CAPSULES BY MOUTH DAILY 60 capsule 4   EPINEPHrine 0.3 mg/0.3 mL IJ SOAJ injection INJECT AS NEEDED FOR ALLERGIC REACTIONS MAY REPEAT IN 20 TO 30 MINUTES IF  NEEDED     furosemide (LASIX) 40 MG tablet TAKE ONE TABLET EACH DAY as needed for swelling 90 tablet 3   gabapentin (NEURONTIN) 600 MG tablet TAKE 1 TABLET BY MOUTH 3 TIMES DAILY 90 tablet 2   hydrochlorothiazide (HYDRODIURIL) 25 MG tablet Take 25 mg by mouth daily.     lidocaine (LIDODERM) 5 % Place 1 patch onto the skin daily. Remove & Discard patch within 12 hours or as directed by MD 60 patch 2   lidocaine (XYLOCAINE) 5 % ointment APPLY TOPICALLY TO AFFECTED AREA 3 TIMESDAILY AS NEEDED 50 g 3   meclizine (ANTIVERT) 25 MG tablet Take by mouth.     Multiple Vitamins-Minerals (CENTRUM SILVER ULTRA WOMENS PO) Take by mouth daily.     mupirocin ointment (BACTROBAN) 2 % APPLY TOPICALLY TO WOUNDS ONCE DAILY 22 g 3   nystatin (MYCOSTATIN/NYSTOP) powder Apply at each dressing     olmesartan (BENICAR) 20 MG tablet TAKE 1 TABLET BY MOUTH EVERY DAY 90 tablet 3   Omega-3 Fatty Acids (FISH OIL) 1000 MG CAPS Take by mouth.     ONETOUCH DELICA LANCETS MISC Use as directed to test blood sugar dx 250.00 100 each 2   oxyCODONE-acetaminophen (PERCOCET) 10-325 MG tablet TAKE 1 TABLET BY MOUTH EVERY 6 HOURS AS NEEDED FOR PAIN 120 tablet 0   oxyCODONE-acetaminophen (PERCOCET) 10-325 MG tablet Take 1 tablet by mouth every 6 (six) hours as needed. for pain 120 tablet 0   phenazopyridine (PYRIDIUM) 95 MG tablet Take 95 mg by mouth.     polyethylene glycol powder (GLYCOLAX/MIRALAX) 17 GM/SCOOP powder Take 17 g by mouth daily.     polyvinyl alcohol (LIQUIFILM TEARS) 1.4 % ophthalmic solution Place 1 drop into the right eye as needed.     Probiotic Product (PROBIOTIC PO) Take by mouth daily.     promethazine (PHENERGAN) 25 MG tablet TAKE 1 TABLET BY MOUTH EVERY 6 HOURS AS NEEDED FOR NAUSEA 60 tablet 2   Psyllium 30.9 % POWD Take by mouth. Take as directed     solifenacin (VESICARE) 5 MG tablet Take 1 tablet by mouth daily.     Tedizolid Phosphate 200 MG TABS Take 200 mg by mouth.     telmisartan (MICARDIS) 20 MG  tablet TAKE 1/2 TABLET (10 MG) BY MOUTH EVERY DAY 30 tablet 2   topiramate (TOPAMAX) 100 MG tablet TAKE 2 TABLETS BY MOUTH EVERY NIGHT AT BEDTIME 60 tablet 4   triamcinolone (NASACORT AQ) 55 MCG/ACT AERO nasal inhaler Place 2 sprays into the nose daily. 1 Inhaler 12   triamcinolone cream (KENALOG) 0.1 %  valACYclovir (VALTREX) 1000 MG tablet TAKE 1 TABLET BY MOUTH 3 TIMES DAILY 30 tablet 5   vitamin C (ASCORBIC ACID) 500 MG tablet Take by mouth.     omeprazole (PRILOSEC) 20 MG capsule Take 20 mg by mouth daily.     No facility-administered medications prior to visit.    Allergies  Allergen Reactions   Morphine Anaphylaxis   Other Anaphylaxis    Airborne solvents   Sulfonamide Derivatives Diarrhea   Clindamycin/Lincomycin Diarrhea   Doxycycline     Nausea, diarrhea   Lyrica [Pregabalin]    Nitrofurantoin     REACTION: itch   Prednisone     Fast heart rate   Adhesive [Tape] Rash   Erythromycin Rash    ROS Review of Systems    Objective:    Physical Exam  BP 134/80    Pulse 78    Ht _0  (1.778 m)    SpO2 93%    BMI 33.72 kg/m  Wt Readings from Last 3 Encounters:  01/03/20 235 lb (106.6 kg)  08/30/19 230 lb (104.3 kg)  04/12/19 230 lb (104.3 kg)     Health Maintenance Due  Topic Date Due   COVID-19 Vaccine (1) Never done   OPHTHALMOLOGY EXAM  Never done   Hepatitis C Screening  Never done   Zoster Vaccines- Shingrix (1 of 2) Never done   FOOT EXAM  12/12/2013   HEMOGLOBIN A1C  09/25/2015   TETANUS/TDAP  07/18/2018   INFLUENZA VACCINE  12/16/2020    There are no preventive care reminders to display for this patient.  Lab Results  Component Value Date   TSH 3.76 03/28/2015   Lab Results  Component Value Date   WBC 6.4 03/28/2015   HGB 11.1 (L) 03/28/2015   HCT 33.9 (L) 03/28/2015   MCV 93.8 03/28/2015   PLT 191.0 03/28/2015   Lab Results  Component Value Date   NA 142 04/01/2017   K 4.3 04/01/2017   CO2 20 04/01/2017   GLUCOSE 90  04/01/2017   BUN 17 04/01/2017   CREATININE 1.31 (H) 04/01/2017   BILITOT 0.3 03/28/2015   ALKPHOS 81 03/28/2015   AST 11 03/28/2015   ALT 7 03/28/2015   PROT 7.5 03/28/2015   ALBUMIN 4.1 03/28/2015   CALCIUM 9.2 04/01/2017   GFR 45.72 (L) 03/28/2015   Lab Results  Component Value Date   CHOL 198 03/28/2015   Lab Results  Component Value Date   HDL 40.80 03/28/2015   Lab Results  Component Value Date   LDLCALC 123 (H) 03/28/2015   Lab Results  Component Value Date   TRIG 168.0 (H) 03/28/2015   Lab Results  Component Value Date   CHOLHDL 5 03/28/2015   Lab Results  Component Value Date   HGBA1C 5.6 03/28/2015      Assessment & Plan:   Problem List Items Addressed This Visit       Nervous and Auditory   Hereditary and idiopathic peripheral neuropathy   Phantom limb pain (HCC)     Musculoskeletal and Integument   Osteoarthritis of left knee   Lumbar spondylosis     Other   Chronic pain syndrome - Primary   Relevant Orders   Drug Tox Monitor 1 w/Conf, Oral Fld   Drug Tox Alc Metab w/Con, Oral Fld   Other Visit Diagnoses     Encounter for therapeutic drug monitoring       Relevant Orders   Drug Tox Monitor 1 w/Conf, Oral Fld  Drug Tox Alc Metab w/Con, Oral Fld   Encounter for long-term use of opiate analgesic       Relevant Orders   Drug Tox Monitor 1 w/Conf, Oral Fld   Drug Tox Alc Metab w/Con, Oral Fld       No orders of the defined types were placed in this encounter.   Follow-up: Return in about 2 months (around 09/15/2021).    Casilda Carls, CMA

## 2021-07-16 NOTE — Patient Instructions (Signed)
PLEASE FEEL FREE TO CALL OUR OFFICE WITH ANY PROBLEMS OR QUESTIONS ([phone])      

## 2021-07-22 LAB — DRUG TOX MONITOR 1 W/CONF, ORAL FLD
Amphetamines: NEGATIVE ng/mL (ref <10)
Barbiturates: NEGATIVE ng/mL (ref <10)
Benzodiazepines: NEGATIVE ng/mL (ref <0.50)
Buprenorphine: NEGATIVE ng/mL (ref <0.10)
Cocaine: NEGATIVE ng/mL (ref <5.0)
Codeine: NEGATIVE ng/mL (ref <2.5)
Dihydrocodeine: NEGATIVE ng/mL (ref <2.5)
Fentanyl: NEGATIVE ng/mL (ref <0.10)
Heroin Metabolite: NEGATIVE ng/mL (ref <1.0)
Hydrocodone: NEGATIVE ng/mL (ref <2.5)
Hydromorphone: NEGATIVE ng/mL (ref <2.5)
MARIJUANA: NEGATIVE ng/mL (ref <2.5)
MDMA: NEGATIVE ng/mL (ref <10)
Meprobamate: NEGATIVE ng/mL (ref <2.5)
Methadone: NEGATIVE ng/mL (ref <5.0)
Morphine: NEGATIVE ng/mL (ref <2.5)
Nicotine Metabolite: NEGATIVE ng/mL (ref <5.0)
Norhydrocodone: NEGATIVE ng/mL (ref <2.5)
Noroxycodone: 174.6 ng/mL — ABNORMAL HIGH (ref <2.5)
Opiates: POSITIVE ng/mL — AB (ref <2.5)
Oxycodone: >250 ng/mL — ABNORMAL HIGH (ref <2.5)
Oxymorphone: 3.8 ng/mL — ABNORMAL HIGH (ref <2.5)
Phencyclidine: NEGATIVE ng/mL (ref <10)
Tapentadol: NEGATIVE ng/mL (ref <5.0)
Tramadol: NEGATIVE ng/mL (ref <5.0)
Zolpidem: NEGATIVE ng/mL (ref <5.0)

## 2021-07-22 LAB — DRUG TOX ALC METAB W/CON, ORAL FLD: Alcohol Metabolite: NEGATIVE ng/mL (ref <25)

## 2021-07-23 ENCOUNTER — Telehealth: Payer: Self-pay | Admitting: *Deleted

## 2021-07-23 NOTE — Telephone Encounter (Signed)
Oral swab drug screen was consistent for prescribed medications.  ?

## 2021-08-01 ENCOUNTER — Other Ambulatory Visit: Payer: Self-pay | Admitting: Physical Medicine & Rehabilitation

## 2021-08-26 ENCOUNTER — Other Ambulatory Visit: Payer: Self-pay | Admitting: Physical Medicine & Rehabilitation

## 2021-08-26 DIAGNOSIS — M4716 Other spondylosis with myelopathy, lumbar region: Secondary | ICD-10-CM

## 2021-08-26 DIAGNOSIS — G609 Hereditary and idiopathic neuropathy, unspecified: Secondary | ICD-10-CM

## 2021-08-26 DIAGNOSIS — G546 Phantom limb syndrome with pain: Secondary | ICD-10-CM

## 2021-08-27 ENCOUNTER — Encounter: Payer: Self-pay | Admitting: *Deleted

## 2021-09-04 ENCOUNTER — Telehealth: Payer: Self-pay | Admitting: Family Medicine

## 2021-09-04 NOTE — Telephone Encounter (Signed)
Please advise 

## 2021-09-04 NOTE — Telephone Encounter (Signed)
Pt last seen dry fry in 2019 and would like a cpe. Pt has had refills from dr fry after 2019. It is ok to sch for cpe ?

## 2021-09-08 ENCOUNTER — Other Ambulatory Visit: Payer: Self-pay | Admitting: Physical Medicine & Rehabilitation

## 2021-09-09 ENCOUNTER — Encounter: Payer: Medicare Other | Admitting: Family Medicine

## 2021-09-09 NOTE — Telephone Encounter (Signed)
No, since it has been over 3 years she will need to switch to another PCP  ?

## 2021-09-09 NOTE — Telephone Encounter (Signed)
Left a message for the patient to return a call to the office.

## 2021-09-11 NOTE — Telephone Encounter (Signed)
Send pt a MyChart message regarding Dr Fry advise 

## 2021-09-16 ENCOUNTER — Encounter: Payer: Medicare Other | Admitting: Family Medicine

## 2021-09-22 ENCOUNTER — Other Ambulatory Visit: Payer: Self-pay | Admitting: Physical Medicine & Rehabilitation

## 2021-09-24 ENCOUNTER — Other Ambulatory Visit: Payer: Self-pay | Admitting: Physical Medicine & Rehabilitation

## 2021-10-04 ENCOUNTER — Other Ambulatory Visit: Payer: Self-pay | Admitting: Physical Medicine & Rehabilitation

## 2021-10-15 ENCOUNTER — Encounter: Payer: Self-pay | Admitting: Physical Medicine & Rehabilitation

## 2021-10-15 ENCOUNTER — Encounter: Payer: Medicare Other | Attending: Physical Medicine & Rehabilitation | Admitting: Physical Medicine & Rehabilitation

## 2021-10-15 VITALS — BP 89/55 | HR 91

## 2021-10-15 DIAGNOSIS — M7581 Other shoulder lesions, right shoulder: Secondary | ICD-10-CM | POA: Insufficient documentation

## 2021-10-15 DIAGNOSIS — M1712 Unilateral primary osteoarthritis, left knee: Secondary | ICD-10-CM | POA: Insufficient documentation

## 2021-10-15 DIAGNOSIS — G609 Hereditary and idiopathic neuropathy, unspecified: Secondary | ICD-10-CM | POA: Diagnosis present

## 2021-10-15 DIAGNOSIS — N390 Urinary tract infection, site not specified: Secondary | ICD-10-CM | POA: Insufficient documentation

## 2021-10-15 DIAGNOSIS — Z89519 Acquired absence of unspecified leg below knee: Secondary | ICD-10-CM | POA: Diagnosis present

## 2021-10-15 DIAGNOSIS — M47816 Spondylosis without myelopathy or radiculopathy, lumbar region: Secondary | ICD-10-CM | POA: Diagnosis present

## 2021-10-15 DIAGNOSIS — G546 Phantom limb syndrome with pain: Secondary | ICD-10-CM | POA: Diagnosis present

## 2021-10-15 DIAGNOSIS — A499 Bacterial infection, unspecified: Secondary | ICD-10-CM | POA: Insufficient documentation

## 2021-10-15 MED ORDER — OXYCODONE-ACETAMINOPHEN 10-325 MG PO TABS: 1.0000 | ORAL_TABLET | Freq: Four times a day (QID) | ORAL | 0 refills | Status: DC | PRN

## 2021-10-15 NOTE — Patient Instructions (Addendum)
PLEASE FEEL FREE TO CALL OUR OFFICE WITH ANY PROBLEMS OR QUESTIONS [id number])  Diclofenac gel to right shoulder 3-4 x daily

## 2021-10-15 NOTE — Progress Notes (Signed)
Subjective:    Patient ID: Crystal Fitzpatrick, female    DOB: 1946-05-16, 76 y.o.   MRN: [id number]  HPI  Crystal Fitzpatrick is here in follow up of her chronic pain. She is still waiting on her powered w/c. She is here with her son who she says had made a lot of difference in her life.   She has had increased pain in her right shoulder. She is doing exercises at home. She is trying a transfer board for transfers. She tries to avoid using it if possible.   She has had ongoing pain in her buttocks due to her long term wounds.  When she is incontinent her wounds become moist and macerated and further problematic.  She is no longer seeing wound care and does not have a primary care physician currently.  She asked if she can take more gabapentin daily.  She is taking 3 of the 600 mg tablets currently.  Last creatinine I can find on her is 1.3 from back in 2018.  She takes this primarily for her leg pain/phantom pain symptoms.  Family is wanting her to move closer to [facility] so they can more easily watch her.  Currently she is living in [place] [employer] [place] [employer].  Also today, Crystal Fitzpatrick is complaining of foul-smelling urine and thinks that she has a urinary tract infection.    Pain Inventory Average Pain 3 Pain Right Now 5 My pain is sharp, burning, dull, tingling, and aching  In the last 24 hours, has pain interfered with the following? General activity 7 Relation with others 6 Enjoyment of life 7 What TIME of day is your pain at its worst? night Sleep (in general) Poor  Pain is worse with: unsure Pain improves with: heat/ice, therapy/exercise, and medication Relief from Meds: 5  Family History  Problem Relation Age of Onset   Breast cancer Sister    Diabetes Other    Stroke Other        Grandparents   Social History   Socioeconomic History   Marital status: Divorced    Spouse name: Not on file   Number of children: 1   Years of education: Not on file   Highest  education level: Not on file  Occupational History   Occupation: retired Oncologist: RETIRED  Tobacco Use   Smoking status: Former   Smokeless tobacco: Never  Building services engineer Use: Never used  Substance and Sexual Activity   Alcohol use: No    Alcohol/week: 0.0 standard drinks   Drug use: No   Sexual activity: Not Currently  Other Topics Concern   Not on file  Social History Narrative   Not on file   Social Determinants of Health   Financial Resource Strain: Not on file  Food Insecurity: Not on file  Transportation Needs: Not on file  Physical Activity: Not on file  Stress: Not on file  Social Connections: Not on file   Past Surgical History:  Procedure Laterality Date   AMPUTATION  02/02/2008   below right knee   foot surgury  1980 and 1981   NASAL SEPTUM SURGERY     s/p BKA  9/09   For DFU and Osteomyelitis   s/p Breast biopsy  1992   s/p EGD  2007   with Botox for? Achalasia   TONSILLECTOMY     TUBAL LIGATION     Past Surgical History:  Procedure Laterality Date  AMPUTATION  02/02/2008   below right knee   foot surgury  1980 and 1981   NASAL SEPTUM SURGERY     s/p BKA  9/09   For DFU and Osteomyelitis   s/p Breast biopsy  1992   s/p EGD  2007   with Botox for? Achalasia   TONSILLECTOMY     TUBAL LIGATION     Past Medical History:  Diagnosis Date   ABDOMINAL DISTENSION 10/11/2008   Acute gouty arthropathy 12/11/2008   Altered mental status 02/07/2010   AMPUTATION, BELOW KNEE, RIGHT, HX OF 01/28/2009   ANAPHYLACTIC SHOCK 05/15/2009   ANEMIA-NOS 07/17/2008   ANXIETY 07/17/2008   ARTHRITIS 01/28/2009   B12 DEFICIENCY 07/17/2008   Cellulitis and abscess of leg, except foot 11/15/2008   CHF 01/28/2009   Chronic pain syndrome 02/14/2010   COLONIC POLYPS, HX OF 07/17/2008   DIABETES MELLITUS, TYPE II 07/17/2008   Dysuria 11/11/2009   FOOT PAIN 07/17/2008   GERD 07/17/2008   HEARING LOSS, RIGHT EAR 06/04/2009   HYPERKALEMIA 10/31/2009    Hyperlipemia 09/24/2010   Hyperlipidemia 09/24/2010   HYPERSOMNIA 02/14/2010   HYPERTENSION 07/17/2008   HYPONATREMIA 03/20/2010   Hypoxemia 03/20/2010   IBS 06/04/2009   MENOPAUSAL DISORDER 12/03/2009   NEUROPATHY, HX OF 01/28/2009   Osteoporosis 11/16/2014   PERIPHERAL EDEMA 08/30/2008   Pneumonia, aspiration (HCC) 12/01/2011   Episode July 2013, tx per [facility]   Pneumonia, organism unspecified(486) 02/14/2010   RENAL INSUFFICIENCY 10/31/2009   Rheumatoid arthritis(714.0) 07/17/2008   Seizure (HCC) 10/26/2010   SKIN LESION 06/04/2009   SKIN ULCER, CHRONIC 02/08/2009   Spinal stenosis of lumbar region 05/15/2015   Trigeminal neuralgia 02/14/2010   UNSPECIFIED HEPATITIS 01/28/2009   Wheezing 12/03/2009   BP (!) 89/55   Pulse 91   SpO2 92%   Opioid Risk Score:   Fall Risk Score:  `1  Depression screen PHQ 2/9     07/27/2018    2:45 PM 04/07/2018    2:04 PM 03/11/2018    1:54 PM 01/05/2018    1:18 PM 12/08/2017   11:42 AM 08/19/2017    2:01 PM 06/24/2017    3:15 PM  Depression screen PHQ 2/9  Decreased Interest 0  Down, Depressed, Hopeless 0  PHQ - 2 Score 0  Altered sleeping      0   Tired, decreased energy      1   Change in appetite      1   Feeling bad or failure about yourself       1   Trouble concentrating      0   Moving slowly or fidgety/restless      0   Suicidal thoughts      0   PHQ-9 Score      5       Review of Systems  Musculoskeletal:        Stump pain Left lower leg pain  All other systems reviewed and are negative.     Objective:   Physical Exam  General: No acute distress, obese HEENT: NCAT, EOMI, oral membranes moist Cards: reg rate  Chest: normal effort Abdomen: Soft, NT, ND Skin: Chronic changes noted in the left lower extremity with some stasis changes noted.  I did not examine her sacral region. Extremities: no edema Psych: pleasant and appropriate  Neuro:  Alert and oriented  x 3. Normal insight  and awareness. Intact Memory. Normal language and speech. Cranial nerve exam unremarkable.  Decreased sensation to light touch in left leg below knee.   UE's: 4/5 deltoids, biceps,triceps 4/5. LLE: 4/5 HF, 3+KE 2-3/5 ADF/PF.    Musc: r right shoulder has diminished range of motion in all planes.  She has most pain with passive abduction.  She also is very tender with external and internal rotation.      Assessment & Plan:  1.Functional deficits secondary to right below-knee amputation, history of phantom limb pain.:             -powered w/c very beneficial from a mobility standpoint he is to continue using.  2. Peripheral neuropathy: continue gabapentin 600mg  TID .  I will increase this further due to her renal function       -conitnue percocet 10/325 #120, refilled today      We will continue the controlled substance monitoring program, this consists of regular clinic visits, examinations, routine drug screening, pill counts as well as use of [place] Controlled Substance Reporting System. [place] today.          3. History of rheumatoid arthritis. Continue Current Medication and Exercise and heat Therapy.   4. Osteoarthritis of left knee: voltaren and HEP 5. Chronic cellulitis with eft leg wound with associated edema/buttock wound:             -pt  needs f/u with wound care clinic about ongoing skin breakdown      -Needs a wound care clinic for follow-up 6. Lumbar Spondylosis: continue HEP.  7. Herpes Zoster outbreak: resolve 8. Right rotator cuff tendonitis       -Made referral to outpatient physical therapy at [facility]. -discussed importance of good posture and body mechanics as well -continue home exercises -Requested x-rays of the right shoulder at [facility] -Can use Voltaren gel over-the-counter, cannot use oral NSAIDs due to her renal dysfunction          9.  Foul-smelling urine, likely UTI -Sent patient for urinalysis and urine  culture today.        30 minutes of face to face patient care time were spent during this visit. All questions were encouraged and answered.  Follow up with NP in 2 mos .

## 2021-10-16 LAB — URINALYSIS
Bilirubin, UA: NEGATIVE
Glucose, UA: NEGATIVE
Ketones, UA: NEGATIVE
Nitrite, UA: POSITIVE — AB
Specific Gravity, UA: 1.018 (ref 1.005–1.030)
Urobilinogen, Ur: 0.2 mg/dL (ref 0.2–1.0)
pH, UA: 6 (ref 5.0–7.5)

## 2021-10-17 ENCOUNTER — Telehealth: Payer: Self-pay

## 2021-10-17 MED ORDER — CEPHALEXIN 500 MG PO CAPS: 500.0000 mg | ORAL_CAPSULE | Freq: Two times a day (BID) | ORAL | 0 refills | Status: DC

## 2021-10-17 NOTE — Telephone Encounter (Signed)
Crystal Fitzpatrick called back for  her urine analysis results. She is complaining of extreme dysuria. If possible please send her treatment to [facility].   Call back phone [phone].

## 2021-10-17 NOTE — Telephone Encounter (Signed)
Please call her back on phone [phone] with the results. The other phone line is  currently not working.

## 2021-10-17 NOTE — Telephone Encounter (Signed)
Patient advised Per Dr. Riley Kill when the urine culture results out. He will prescribe a medication.   Crystal Fitzpatrick understood the plan. Patient advised to drink plenty of H2O.

## 2021-10-17 NOTE — Addendum Note (Signed)
Addended by: Faith Rogue T on: 10/17/2021 05:03 PM   Modules accepted: Orders

## 2021-10-24 ENCOUNTER — Telehealth (HOSPITAL_COMMUNITY): Payer: Self-pay | Admitting: Physical Medicine & Rehabilitation

## 2021-10-24 LAB — URINE CULTURE

## 2021-10-24 MED ORDER — LEVOFLOXACIN 250 MG PO TABS: 250.0000 mg | ORAL_TABLET | Freq: Every day | ORAL | 0 refills | Status: AC

## 2021-10-24 NOTE — Telephone Encounter (Signed)
Ucx + pseudomonas, not sensitive to keflex. Pt still with sx. Rx for levaquin sent to pharmacy.

## 2021-10-30 ENCOUNTER — Other Ambulatory Visit: Payer: Self-pay | Admitting: Physical Medicine & Rehabilitation

## 2021-10-30 DIAGNOSIS — L03116 Cellulitis of left lower limb: Secondary | ICD-10-CM

## 2021-11-28 ENCOUNTER — Other Ambulatory Visit: Payer: Self-pay | Admitting: Physical Medicine & Rehabilitation

## 2021-11-28 DIAGNOSIS — G546 Phantom limb syndrome with pain: Secondary | ICD-10-CM

## 2021-11-28 DIAGNOSIS — M47816 Spondylosis without myelopathy or radiculopathy, lumbar region: Secondary | ICD-10-CM

## 2021-11-28 DIAGNOSIS — G609 Hereditary and idiopathic neuropathy, unspecified: Secondary | ICD-10-CM

## 2021-11-28 DIAGNOSIS — M4716 Other spondylosis with myelopathy, lumbar region: Secondary | ICD-10-CM

## 2021-11-28 DIAGNOSIS — Z89519 Acquired absence of unspecified leg below knee: Secondary | ICD-10-CM

## 2021-11-28 DIAGNOSIS — M1712 Unilateral primary osteoarthritis, left knee: Secondary | ICD-10-CM

## 2021-12-15 ENCOUNTER — Encounter: Payer: Medicare Other | Admitting: Registered Nurse

## 2022-02-11 ENCOUNTER — Encounter: Payer: Self-pay | Admitting: Physical Medicine & Rehabilitation

## 2022-02-11 ENCOUNTER — Encounter: Payer: Medicare Other | Attending: Physical Medicine & Rehabilitation | Admitting: Physical Medicine & Rehabilitation

## 2022-02-11 VITALS — BP 120/73 | HR 76 | Ht 70.0 in

## 2022-02-11 DIAGNOSIS — G609 Hereditary and idiopathic neuropathy, unspecified: Secondary | ICD-10-CM | POA: Insufficient documentation

## 2022-02-11 DIAGNOSIS — M1712 Unilateral primary osteoarthritis, left knee: Secondary | ICD-10-CM | POA: Diagnosis present

## 2022-02-11 DIAGNOSIS — M47816 Spondylosis without myelopathy or radiculopathy, lumbar region: Secondary | ICD-10-CM | POA: Diagnosis present

## 2022-02-11 DIAGNOSIS — Z89519 Acquired absence of unspecified leg below knee: Secondary | ICD-10-CM | POA: Insufficient documentation

## 2022-02-11 DIAGNOSIS — G546 Phantom limb syndrome with pain: Secondary | ICD-10-CM | POA: Insufficient documentation

## 2022-02-11 DIAGNOSIS — M7581 Other shoulder lesions, right shoulder: Secondary | ICD-10-CM | POA: Diagnosis present

## 2022-02-11 MED ORDER — OXYCODONE-ACETAMINOPHEN 10-325 MG PO TABS: 1.0000 | ORAL_TABLET | Freq: Four times a day (QID) | ORAL | 0 refills | Status: AC | PRN

## 2022-02-11 NOTE — Patient Instructions (Signed)
PLEASE FEEL FREE TO CALL OUR OFFICE WITH ANY PROBLEMS OR QUESTIONS ([phone])      

## 2022-02-11 NOTE — Progress Notes (Signed)
Subjective:    Patient ID: Crystal Fitzpatrick, female    DOB: May 24, 1945, 76 y.o.   MRN: [id number]  HPI  Crystal Fitzpatrick is here in follow up of her chronic pain. She was in the hospital for ?rhabdomyolysis last month after she got pinned up against the railing of her bed and the wall. She was discharged to SNF and is expecting to be discharged this weekend.   Her meds were reduced without her consent and pain levels have gone up as a result. She is only receiving one percocet per day currently as she was receiving 4x per day. She remains on gabapentin 600mg  TID.   Physical therapy has done some work on her right shoulder but have told her that she has a "tear" and won't do a lot with her. They apparently ordered an xray which was negative for fx. At this point her pain has continued to increase. She never got to [facility] for outpt therapy as we ordered nor did she have xray.    Pain Inventory Average Pain 7 Pain Right Now 8 My pain is constant, sharp, burning, and stabbing  In the last 24 hours, has pain interfered with the following? General activity 9 Relation with others 10 Enjoyment of life 8 What TIME of day is your pain at its worst? morning  and night Sleep (in general) Poor  Pain is worse with: sitting and inactivity Pain improves with: rest Relief from Meds: 2  Family History  Problem Relation Age of Onset   Breast cancer Sister    Diabetes Other    Stroke Other        Grandparents   Social History   Socioeconomic History   Marital status: Divorced    Spouse name: Not on file   Number of children: 1   Years of education: Not on file   Highest education level: Not on file  Occupational History   Occupation: retired : RETIRED  Tobacco Use   Smoking status: Former   Smokeless tobacco: Never  Oncologist Use: Never used  Substance and Sexual Activity   Alcohol use: No    Alcohol/week: 0.0 standard drinks of alcohol    Drug use: No   Sexual activity: Not Currently  Other Topics Concern   Not on file  Social History Narrative   Not on file   Social Determinants of Health   Financial Resource Strain: Not on file  Food Insecurity: Not on file  Transportation Needs: Not on file  Physical Activity: Not on file  Stress: Not on file  Social Connections: Not on file   Past Surgical History:  Procedure Laterality Date   AMPUTATION  02/02/2008   below right knee   foot surgury  1980 and 1981   NASAL SEPTUM SURGERY     s/p BKA  9/09   For DFU and Osteomyelitis   s/p Breast biopsy  1992   s/p EGD  2007   with Botox for? Achalasia   TONSILLECTOMY     TUBAL LIGATION     Past Surgical History:  Procedure Laterality Date   AMPUTATION  02/02/2008   below right knee   foot surgury  1980 and 1981   NASAL SEPTUM SURGERY     s/p BKA  9/09   For DFU and Osteomyelitis   s/p Breast biopsy  1992   s/p EGD  2007   with Botox for? Achalasia   TONSILLECTOMY  TUBAL LIGATION     Past Medical History:  Diagnosis Date   ABDOMINAL DISTENSION 10/11/2008   Acute gouty arthropathy 12/11/2008   Altered mental status 02/07/2010   AMPUTATION, BELOW KNEE, RIGHT, HX OF 01/28/2009   ANAPHYLACTIC SHOCK 05/15/2009   ANEMIA-NOS 07/17/2008   ANXIETY 07/17/2008   ARTHRITIS 01/28/2009   B12 DEFICIENCY 07/17/2008   Cellulitis and abscess of leg, except foot 11/15/2008   CHF 01/28/2009   Chronic pain syndrome 02/14/2010   COLONIC POLYPS, HX OF 07/17/2008   DIABETES MELLITUS, TYPE II 07/17/2008   Dysuria 11/11/2009   FOOT PAIN 07/17/2008   GERD 07/17/2008   HEARING LOSS, RIGHT EAR 06/04/2009   HYPERKALEMIA 10/31/2009   Hyperlipemia 09/24/2010   Hyperlipidemia 09/24/2010   HYPERSOMNIA 02/14/2010   HYPERTENSION 07/17/2008   HYPONATREMIA 03/20/2010   Hypoxemia 03/20/2010   IBS 06/04/2009   MENOPAUSAL DISORDER 12/03/2009   NEUROPATHY, HX OF 01/28/2009   Osteoporosis 11/16/2014   PERIPHERAL EDEMA 08/30/2008   Pneumonia, aspiration (HCC) 12/01/2011    Episode July 2013, tx per [facility]   Pneumonia, organism unspecified(486) 02/14/2010   RENAL INSUFFICIENCY 10/31/2009   Rheumatoid arthritis(714.0) 07/17/2008   Seizure (HCC) 10/26/2010   SKIN LESION 06/04/2009   SKIN ULCER, CHRONIC 02/08/2009   Spinal stenosis of lumbar region 05/15/2015   Trigeminal neuralgia 02/14/2010   UNSPECIFIED HEPATITIS 01/28/2009   Wheezing 12/03/2009   BP 120/73   Pulse 76   Ht 5\' 10"  (1.778 m)   SpO2 95%   BMI 33.72 kg/m   Opioid Risk Score:   Fall Risk Score:  `1  Depression screen PHQ 2/9     07/27/2018    2:45 PM 04/07/2018    2:04 PM 03/11/2018    1:54 PM 01/05/2018    1:18 PM 12/08/2017   11:42 AM 08/19/2017    2:01 PM 06/24/2017    3:15 PM  Depression screen PHQ 2/9  Decreased Interest 1 1 1 1 1 1  0  Down, Depressed, Hopeless 1 1 1 1 1 1  0  PHQ - 2 Score 2 2 2 2 2 2  0  Altered sleeping      0   Tired, decreased energy      1   Change in appetite      1   Feeling bad or failure about yourself       1   Trouble concentrating      0   Moving slowly or fidgety/restless      0   Suicidal thoughts      0   PHQ-9 Score      5     Review of Systems  Musculoskeletal:  Positive for gait problem.       Left stump pain Right neck, right shoulder, right arm pain   All other systems reviewed and are negative.      Objective:   Physical Exam  General: No acute distress. Has lost some weight HEENT: NCAT, EOMI, oral membranes moist Cards: reg rate  Chest: normal effort Abdomen: Soft, NT, ND Skin: dry, intact Extremities: no edema Psych: pleasant and appropriate  Neuro:  Alert and oriented x 3. Normal insight and awareness. Intact Memory. Normal language and speech. Cranial nerve exam unremarkable.  Decreased sensation to light touch in left leg below knee.   UE's: 4/5 deltoids, biceps,triceps 4/5. LLE: 4/5 HF, 3+KE 2-3/5 ADF/PF.    Musc: right shoulder with limited ROM especially with ABD. Can abduct actively to only 30-40  degrees.    Assessment & Plan:  1.Functional deficits secondary to right below-knee amputation, history of phantom limb pain.:             -powered w/c appropriate 2. Peripheral neuropathy: continue gabapentin 600mg  TID .         -conitnue percocet 10/325 #120-  resume today        We will continue the controlled substance monitoring program, this consists of regular clinic visits, examinations, routine drug screening, pill counts as well as use of [place] Controlled Substance Reporting System. [place] today.   3. History of rheumatoid arthritis. Continue Current Medication and Exercise and heat Therapy.   4. Osteoarthritis of left knee: voltaren and HEP 5. Chronic cellulitis with left leg wound with associated edema/buttock wound:             -wound care clinic f/u 6. Lumbar Spondylosis: continue HEP.  7. Herpes Zoster outbreak: resolve 8. Right rotator cuff tendonitis. ?tear now      -MRI without contrast  -revisit therapy based on MRI results.  -may need xray report to justify MRI order                   30 minutes of face to face patient care time were spent during this visit. All questions were encouraged and answered.  Follow up with NP in 2 mos .

## 2022-02-25 ENCOUNTER — Telehealth: Payer: Self-pay

## 2022-02-25 NOTE — Telephone Encounter (Signed)
Per Dr. Naaman Plummer order open MRI because he will not be able to prescribe more than a Valium. I called and informed patient. I told we would have to find a place that does open MRI but if she finds a place first call and let us know and we will send over order.

## 2022-02-25 NOTE — Telephone Encounter (Signed)
She said that the Valium is not enough for a closed MRI. She said the person that called her to schedule said that there was a strong sedative that puts someone to sleep for MRI. She states she needs that medication if it is going to be closed MRI due to her claustrophobia. She states if there is a place that does open MRI she would be fine.

## 2022-02-25 NOTE — Telephone Encounter (Signed)
Crystal Fitzpatrick is has a pending MRI. She has severe claustrophobia and [facility] does not have an open MRI. Patient wanted to know if she could have  more than the standard sedation?

## 2022-03-06 ENCOUNTER — Telehealth: Payer: Self-pay | Admitting: Physical Medicine & Rehabilitation

## 2022-03-06 NOTE — Telephone Encounter (Signed)
Patient continues to look for an open MRI and wanted to make provider aware

## 2022-03-09 ENCOUNTER — Ambulatory Visit (HOSPITAL_COMMUNITY): Payer: Medicare Other

## 2022-03-09 ENCOUNTER — Telehealth: Payer: Self-pay

## 2022-03-09 NOTE — Telephone Encounter (Signed)
Call placed to Crystal Fitzpatrick , she states she canceled the MRI. She is in the process of obtaining a open MRI.

## 2022-03-09 NOTE — Telephone Encounter (Signed)
Patient told Zella Ball that she was not going to get a MRI

## 2022-04-08 ENCOUNTER — Encounter: Payer: Self-pay | Admitting: Physical Medicine & Rehabilitation

## 2022-04-08 ENCOUNTER — Encounter: Payer: Medicare Other | Attending: Physical Medicine & Rehabilitation | Admitting: Physical Medicine & Rehabilitation

## 2022-04-08 VITALS — BP 167/87 | HR 92

## 2022-04-08 DIAGNOSIS — M1712 Unilateral primary osteoarthritis, left knee: Secondary | ICD-10-CM | POA: Insufficient documentation

## 2022-04-08 DIAGNOSIS — M47816 Spondylosis without myelopathy or radiculopathy, lumbar region: Secondary | ICD-10-CM | POA: Diagnosis present

## 2022-04-08 DIAGNOSIS — G609 Hereditary and idiopathic neuropathy, unspecified: Secondary | ICD-10-CM | POA: Diagnosis present

## 2022-04-08 DIAGNOSIS — Z89519 Acquired absence of unspecified leg below knee: Secondary | ICD-10-CM | POA: Insufficient documentation

## 2022-04-08 DIAGNOSIS — G546 Phantom limb syndrome with pain: Secondary | ICD-10-CM | POA: Insufficient documentation

## 2022-04-08 MED ORDER — OXYCODONE-ACETAMINOPHEN 10-325 MG PO TABS: 1.0000 | ORAL_TABLET | Freq: Four times a day (QID) | ORAL | 0 refills | Status: AC | PRN

## 2022-04-08 NOTE — Progress Notes (Signed)
Subjective:    Patient ID: Crystal Fitzpatrick, female    DOB: 12-05-1945, 76 y.o.   MRN: [id number]  HPI Crystal Fitzpatrick is here in follow up of her chronic pain. She never got the MRI as she couldn't find an open MRI where she could lie flat. She's at a senior living facility who hasn't dealt with the shoulder. She has done some exercises on her own which seem to have helped. She will not be at the facility long term.   She remains on percocet and gabapentin for pain control. Her bowels are regular. She is tolerating the medications without sedation.   Pain Inventory Average Pain 9 Pain Right Now 7 My pain is aching  In the last 24 hours, has pain interfered with the following? General activity 9 Relation with others 10 Enjoyment of life 10 What TIME of day is your pain at its worst? morning  and night Sleep (in general) Poor  Pain is worse with: inactivity Pain improves with: medication Relief from Meds: 5  Family History  Problem Relation Age of Onset   Breast cancer Sister    Diabetes Other    Stroke Other        Grandparents   Social History   Socioeconomic History   Marital status: Divorced    Spouse name: Not on file   Number of children: 1   Years of education: Not on file   Highest education level: Not on file  Occupational History   Occupation: retired Oncologist: RETIRED  Tobacco Use   Smoking status: Former   Smokeless tobacco: Never  Building services engineer Use: Never used  Substance and Sexual Activity   Alcohol use: No    Alcohol/week: 0.0 standard drinks of alcohol   Drug use: No   Sexual activity: Not Currently  Other Topics Concern   Not on file  Social History Narrative   Not on file   Social Determinants of Health   Financial Resource Strain: Not on file  Food Insecurity: Not on file  Transportation Needs: Not on file  Physical Activity: Not on file  Stress: Not on file  Social Connections: Not on file   Past  Surgical History:  Procedure Laterality Date   AMPUTATION  02/02/2008   below right knee   foot surgury  1980 and 1981   NASAL SEPTUM SURGERY     s/p BKA  9/09   For DFU and Osteomyelitis   s/p Breast biopsy  1992   s/p EGD  2007   with Botox for? Achalasia   TONSILLECTOMY     TUBAL LIGATION     Past Surgical History:  Procedure Laterality Date   AMPUTATION  02/02/2008   below right knee   foot surgury  1980 and 1981   NASAL SEPTUM SURGERY     s/p BKA  9/09   For DFU and Osteomyelitis   s/p Breast biopsy  1992   s/p EGD  2007   with Botox for? Achalasia   TONSILLECTOMY     TUBAL LIGATION     Past Medical History:  Diagnosis Date   ABDOMINAL DISTENSION 10/11/2008   Acute gouty arthropathy 12/11/2008   Altered mental status 02/07/2010   AMPUTATION, BELOW KNEE, RIGHT, HX OF 01/28/2009   ANAPHYLACTIC SHOCK 05/15/2009   ANEMIA-NOS 07/17/2008   ANXIETY 07/17/2008   ARTHRITIS 01/28/2009   B12 DEFICIENCY 07/17/2008   Cellulitis and abscess of leg, except foot 11/15/2008  CHF 01/28/2009   Chronic pain syndrome 02/14/2010   COLONIC POLYPS, HX OF 07/17/2008   DIABETES MELLITUS, TYPE II 07/17/2008   Dysuria 11/11/2009   FOOT PAIN 07/17/2008   GERD 07/17/2008   HEARING LOSS, RIGHT EAR 06/04/2009   HYPERKALEMIA 10/31/2009   Hyperlipemia 09/24/2010   Hyperlipidemia 09/24/2010   HYPERSOMNIA 02/14/2010   HYPERTENSION 07/17/2008   HYPONATREMIA 03/20/2010   Hypoxemia 03/20/2010   IBS 06/04/2009   MENOPAUSAL DISORDER 12/03/2009   NEUROPATHY, HX OF 01/28/2009   Osteoporosis 11/16/2014   PERIPHERAL EDEMA 08/30/2008   Pneumonia, aspiration (HCC) 12/01/2011   Episode July 2013, tx per [facility]   Pneumonia, organism unspecified(486) 02/14/2010   RENAL INSUFFICIENCY 10/31/2009   Rheumatoid arthritis(714.0) 07/17/2008   Seizure (HCC) 10/26/2010   SKIN LESION 06/04/2009   SKIN ULCER, CHRONIC 02/08/2009   Spinal stenosis of lumbar region 05/15/2015   Trigeminal neuralgia 02/14/2010   UNSPECIFIED HEPATITIS  01/28/2009   Wheezing 12/03/2009   BP (!) 167/87   Pulse 92   SpO2 94%   Opioid Risk Score:   Fall Risk Score:  `1  Depression screen [facility] 2/9     02/11/2022   11:57 AM 07/27/2018    2:45 PM 04/07/2018    2:04 PM 03/11/2018    1:54 PM 01/05/2018    1:18 PM 12/08/2017   11:42 AM 08/19/2017    2:01 PM  Depression screen PHQ 2/9  Decreased Interest 1 1 1 1 1 1 1   Down, Depressed, Hopeless 1 1 1 1 1 1 1   PHQ - 2 Score 2 2 2 2 2 2 2   Altered sleeping       0  Tired, decreased energy       1  Change in appetite       1  Feeling bad or failure about yourself        1  Trouble concentrating       0  Moving slowly or fidgety/restless       0  Suicidal thoughts       0  PHQ-9 Score       5     Review of Systems  Musculoskeletal:        Right shoulder pain  All other systems reviewed and are negative.     Objective:   Physical Exam General: No acute distress. Has lost some weight HEENT: NCAT, EOMI, oral membranes moist Cards: reg rate  Chest: normal effort Abdomen: Soft, NT, ND Skin: dry, intact Extremities: no edema Psych: pleasant and appropriate  Neuro:  Alert and oriented x 3.HOH.  Normal insight and awareness. Intact Memory. Normal language and speech. Cranial nerve exam unremarkable.  Decreased sensation to light touch in left leg below knee.   UE's: 4/5 deltoids, biceps,triceps 4/5. LLE: 4/5 HF, 3+KE 2-3/5 ADF/PF.    Musc: right shoulder with limited ROM especially in all planes. Still only has about 40 degrees of active ABD. Impingement maneuver +.   Assessment & Plan:  1.Functional deficits secondary to right below-knee amputation, history of phantom limb pain.:             -powered w/c appropriate 2. Peripheral neuropathy: continue gabapentin 600mg  TID .         -conitnue percocet 10/325 #120-  resume today        We will continue the controlled substance monitoring program, this consists of regular clinic visits, examinations, routine drug screening, pill counts as  well as use of  [place] Controlled Substance Reporting System. [place] was [place] today.     3. History of rheumatoid arthritis. Continue Current Medication and Exercise and heat Therapy.   4. Osteoarthritis of left knee: voltaren and HEP 5. Chronic cellulitis with left leg wound with associated edema/buttock wound:             -wound care clinic f/u 6. Lumbar Spondylosis: continue HEP.  7. Herpes Zoster outbreak: resolve 8. Right rotator cuff tendonitis. ?tear now      -MRI without contrast --still waiting as open MRI's want her to stand for study -begin therapy to improve ROM/pain--gave instructions for staff therapists -consider steroid injection. +   Twenty minutes of face to face patient care time were spent during this visit. All questions were encouraged and answered.  Follow up with me in 2 mos .

## 2022-04-08 NOTE — Patient Instructions (Signed)
ALWAYS FEEL FREE TO CALL OUR OFFICE WITH ANY PROBLEMS OR QUESTIONS ([phone])  **PLEASE NOTE** ALL MEDICATION REFILL REQUESTS (INCLUDING CONTROLLED SUBSTANCES) NEED TO BE MADE AT LEAST 7 DAYS PRIOR TO REFILL BEING DUE. ANY REFILL REQUESTS INSIDE THAT TIME FRAME MAY RESULT IN DELAYS IN RECEIVING YOUR PRESCRIPTION.                    

## 2022-04-16 ENCOUNTER — Ambulatory Visit (HOSPITAL_COMMUNITY): Admission: RE | Admit: 2022-04-16 | Payer: Medicare Other | Source: Ambulatory Visit

## 2022-04-16 ENCOUNTER — Telehealth: Payer: Self-pay | Admitting: *Deleted

## 2022-04-16 NOTE — Telephone Encounter (Signed)
Trish called from PT and said she saw Crystal Fitzpatrick for [facility]. She wanted [name] to know that Giuliana had just been d/c'd from another PT on 11/9 because she would not cooperate and dismissed therapist several times. However, she did cooperate with the OT and was discharged 04/02/22 with their services. Rosann Auerbach will try to work with her and see if cooperation is better.

## 2022-05-14 ENCOUNTER — Telehealth: Payer: Self-pay

## 2022-05-14 NOTE — Telephone Encounter (Signed)
Verbal Orders given for PT 2x a week to Crystal Fitzpatrick

## 2022-06-03 ENCOUNTER — Encounter: Payer: Medicare Other | Attending: Physical Medicine & Rehabilitation | Admitting: Physical Medicine & Rehabilitation

## 2022-06-03 DIAGNOSIS — M47816 Spondylosis without myelopathy or radiculopathy, lumbar region: Secondary | ICD-10-CM | POA: Insufficient documentation

## 2022-06-03 DIAGNOSIS — G546 Phantom limb syndrome with pain: Secondary | ICD-10-CM | POA: Insufficient documentation

## 2022-06-03 DIAGNOSIS — Z89519 Acquired absence of unspecified leg below knee: Secondary | ICD-10-CM | POA: Insufficient documentation

## 2022-06-03 DIAGNOSIS — G609 Hereditary and idiopathic neuropathy, unspecified: Secondary | ICD-10-CM | POA: Insufficient documentation

## 2022-06-03 DIAGNOSIS — M1712 Unilateral primary osteoarthritis, left knee: Secondary | ICD-10-CM | POA: Insufficient documentation

## 2022-08-19 ENCOUNTER — Encounter: Payer: Medicare Other | Attending: Physical Medicine & Rehabilitation | Admitting: Physical Medicine & Rehabilitation

## 2022-08-19 DIAGNOSIS — M47816 Spondylosis without myelopathy or radiculopathy, lumbar region: Secondary | ICD-10-CM | POA: Insufficient documentation

## 2022-08-19 DIAGNOSIS — G546 Phantom limb syndrome with pain: Secondary | ICD-10-CM | POA: Insufficient documentation

## 2022-08-19 DIAGNOSIS — Z89519 Acquired absence of unspecified leg below knee: Secondary | ICD-10-CM | POA: Insufficient documentation

## 2022-08-19 DIAGNOSIS — M1712 Unilateral primary osteoarthritis, left knee: Secondary | ICD-10-CM | POA: Insufficient documentation

## 2022-08-19 DIAGNOSIS — G609 Hereditary and idiopathic neuropathy, unspecified: Secondary | ICD-10-CM | POA: Insufficient documentation
